# Patient Record
Sex: Female | Born: 1960 | Race: Black or African American | Hispanic: No | Marital: Married | Smoking: Former smoker
Health system: Southern US, Community
[De-identification: ages and names within clinical notes are randomized; demographics above are authoritative.]

## PROBLEM LIST (undated history)

## (undated) DIAGNOSIS — F209 Schizophrenia, unspecified: Secondary | ICD-10-CM

## (undated) DIAGNOSIS — J45909 Unspecified asthma, uncomplicated: Secondary | ICD-10-CM

## (undated) DIAGNOSIS — I10 Essential (primary) hypertension: Secondary | ICD-10-CM

## (undated) DIAGNOSIS — J069 Acute upper respiratory infection, unspecified: Secondary | ICD-10-CM

## (undated) DIAGNOSIS — E785 Hyperlipidemia, unspecified: Secondary | ICD-10-CM

## (undated) DIAGNOSIS — E079 Disorder of thyroid, unspecified: Secondary | ICD-10-CM

## (undated) DIAGNOSIS — L509 Urticaria, unspecified: Secondary | ICD-10-CM

## (undated) DIAGNOSIS — T783XXA Angioneurotic edema, initial encounter: Secondary | ICD-10-CM

## (undated) DIAGNOSIS — K219 Gastro-esophageal reflux disease without esophagitis: Secondary | ICD-10-CM

## (undated) DIAGNOSIS — B019 Varicella without complication: Secondary | ICD-10-CM

## (undated) DIAGNOSIS — T7840XA Allergy, unspecified, initial encounter: Secondary | ICD-10-CM

## (undated) DIAGNOSIS — F32A Depression, unspecified: Secondary | ICD-10-CM

## (undated) DIAGNOSIS — R079 Chest pain, unspecified: Secondary | ICD-10-CM

## (undated) DIAGNOSIS — N189 Chronic kidney disease, unspecified: Secondary | ICD-10-CM

## (undated) DIAGNOSIS — E0590 Thyrotoxicosis, unspecified without thyrotoxic crisis or storm: Secondary | ICD-10-CM

## (undated) DIAGNOSIS — F419 Anxiety disorder, unspecified: Secondary | ICD-10-CM

## (undated) DIAGNOSIS — D649 Anemia, unspecified: Secondary | ICD-10-CM

## (undated) DIAGNOSIS — G4730 Sleep apnea, unspecified: Secondary | ICD-10-CM

## (undated) DIAGNOSIS — E119 Type 2 diabetes mellitus without complications: Secondary | ICD-10-CM

## (undated) HISTORY — DX: Essential (primary) hypertension: I10

## (undated) HISTORY — DX: Acute upper respiratory infection, unspecified: J06.9

## (undated) HISTORY — DX: Urticaria, unspecified: L50.9

## (undated) HISTORY — DX: Varicella without complication: B01.9

## (undated) HISTORY — DX: Unspecified asthma, uncomplicated: J45.909

## (undated) HISTORY — PX: TONSILLECTOMY: SUR1361

## (undated) HISTORY — DX: Disorder of thyroid, unspecified: E07.9

## (undated) HISTORY — DX: Angioneurotic edema, initial encounter: T78.3XXA

## (undated) HISTORY — DX: Anxiety disorder, unspecified: F41.9

## (undated) HISTORY — DX: Depression, unspecified: F32.A

## (undated) HISTORY — PX: TUBAL LIGATION: SHX77

## (undated) HISTORY — PX: SINOSCOPY: SHX187

## (undated) HISTORY — DX: Allergy, unspecified, initial encounter: T78.40XA

## (undated) HISTORY — PX: SINUS IRRIGATION: SHX2411

## (undated) HISTORY — PX: COLONOSCOPY: SHX174

## (undated) HISTORY — DX: Hyperlipidemia, unspecified: E78.5

## (undated) HISTORY — DX: Chronic kidney disease, unspecified: N18.9

---
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
## 2009-03-13 DIAGNOSIS — J45909 Unspecified asthma, uncomplicated: Secondary | ICD-10-CM | POA: Insufficient documentation

## 2009-10-03 DIAGNOSIS — R400 Somnolence: Secondary | ICD-10-CM | POA: Insufficient documentation

## 2012-04-28 DIAGNOSIS — J329 Chronic sinusitis, unspecified: Secondary | ICD-10-CM | POA: Diagnosis not present

## 2012-08-25 LAB — HM MAMMOGRAPHY

## 2013-01-10 ENCOUNTER — Ambulatory Visit (INDEPENDENT_AMBULATORY_CARE_PROVIDER_SITE_OTHER): Payer: Medicare Other | Admitting: Internal Medicine

## 2013-01-10 ENCOUNTER — Other Ambulatory Visit (INDEPENDENT_AMBULATORY_CARE_PROVIDER_SITE_OTHER): Payer: Medicare Other

## 2013-01-10 ENCOUNTER — Encounter: Payer: Self-pay | Admitting: Internal Medicine

## 2013-01-10 VITALS — BP 118/60 | HR 83 | Temp 98.8°F | Resp 16 | Ht 64.0 in | Wt 231.0 lb

## 2013-01-10 DIAGNOSIS — R5381 Other malaise: Secondary | ICD-10-CM

## 2013-01-10 DIAGNOSIS — E039 Hypothyroidism, unspecified: Secondary | ICD-10-CM | POA: Diagnosis not present

## 2013-01-10 DIAGNOSIS — E785 Hyperlipidemia, unspecified: Secondary | ICD-10-CM | POA: Diagnosis not present

## 2013-01-10 DIAGNOSIS — R7302 Impaired glucose tolerance (oral): Secondary | ICD-10-CM

## 2013-01-10 DIAGNOSIS — E669 Obesity, unspecified: Secondary | ICD-10-CM

## 2013-01-10 DIAGNOSIS — R5383 Other fatigue: Secondary | ICD-10-CM

## 2013-01-10 DIAGNOSIS — R7309 Other abnormal glucose: Secondary | ICD-10-CM

## 2013-01-10 DIAGNOSIS — I10 Essential (primary) hypertension: Secondary | ICD-10-CM

## 2013-01-10 DIAGNOSIS — F209 Schizophrenia, unspecified: Secondary | ICD-10-CM

## 2013-01-10 LAB — CBC
MCHC: 32.8 g/dL (ref 30.0–36.0)
MCV: 84.8 fl (ref 78.0–100.0)
Platelets: 294 10*3/uL (ref 150.0–400.0)

## 2013-01-10 LAB — BASIC METABOLIC PANEL
BUN: 13 mg/dL (ref 6–23)
GFR: 62.57 mL/min (ref >60.00)
Potassium: 3.3 mEq/L — ABNORMAL LOW (ref 3.5–5.1)

## 2013-01-10 LAB — LDL CHOLESTEROL, DIRECT: Direct LDL: 124.1 mg/dL

## 2013-01-10 LAB — LIPID PANEL
Cholesterol: 216 mg/dL — ABNORMAL HIGH (ref 0–200)
VLDL: 22.8 mg/dL (ref 0.0–40.0)

## 2013-01-10 LAB — HEMOGLOBIN A1C: Hgb A1c MFr Bld: 6.2 % (ref 4.6–6.5)

## 2013-01-10 NOTE — Assessment & Plan Note (Signed)
Well controlled Continue current meds at this time Consume a low sodium diet

## 2013-01-10 NOTE — Patient Instructions (Signed)

## 2013-01-10 NOTE — Assessment & Plan Note (Signed)
Uncontrolled with poor diet compliance Will recheck lipid panel today If still elevated, will start statin therpay

## 2013-01-10 NOTE — Assessment & Plan Note (Signed)
Will check Hgb A1c today  

## 2013-01-10 NOTE — Assessment & Plan Note (Signed)
Will check TSH today.  Will titrate meds based on levels

## 2013-01-10 NOTE — Assessment & Plan Note (Signed)
Start a diet and exercise program today

## 2013-01-10 NOTE — Progress Notes (Signed)
HPI  Pt presents to the clinic today to establish care. She is transferring care from [facility] in [place], [place]. She has no concerns today.  Flu: 2013 Tetanus: greater than 10 years Mammogram: 2013 Pap smear: 2013 LMP: 12/11/2012 Colonoscopy: 2013 Eye doctor: never Dentist: prn  Past Medical History  Diagnosis Date  . Asthma   . Chicken pox   . Allergy   . Hypertension   . Hyperlipidemia   . Thyroid disease     Current Outpatient Prescriptions  Medication Sig Dispense Refill  . Albuterol Sulfate (VENTOLIN HFA IN) Inhale 2 puffs into the lungs 4 (four) times daily.      Marland Kitchen amLODipine (NORVASC) 10 MG tablet Take 10 mg by mouth daily.      . carvedilol (COREG) 12.5 MG tablet Take 12.5 mg by mouth 2 (two) times daily with a meal.      . cetirizine (ZYRTEC) 10 MG tablet Take 10 mg by mouth daily.      . Fluticasone-Salmeterol (ADVAIR) 250-50 MCG/DOSE AEPB Inhale 1 puff into the lungs every 12 (twelve) hours.      . haloperidol (HALDOL) 1 MG tablet Take 1 mg by mouth 2 (two) times daily.      . hydrochlorothiazide (HYDRODIURIL) 25 MG tablet Take 25 mg by mouth daily.      Marland Kitchen levothyroxine (SYNTHROID, LEVOTHROID) 50 MCG tablet Take 50 mcg by mouth daily.      Marland Kitchen lisinopril (PRINIVIL,ZESTRIL) 40 MG tablet Take 40 mg by mouth daily.      . mometasone (NASONEX) 50 MCG/ACT nasal spray Place 2 sprays into the nose daily.      . potassium chloride SA (K-DUR,KLOR-CON) 20 MEQ tablet Take 20 mEq by mouth daily.        No Known Allergies  Family History  Problem Relation Age of Onset  . Mental illness Mother   . Hypertension Mother   . Cancer Father     LUNG  . Cancer Sister     BREAST  . Mental illness Brother   . Hypertension Brother   . Heart disease Maternal Grandmother     History   Social History  . Marital Status: Married    Spouse Name: N/A    Number of Children: N/A  . Years of Education: N/A   Occupational History  . Not on file.   Social History Main  Topics  . Smoking status: Never Smoker   . Smokeless tobacco: Not on file  . Alcohol Use: Not on file  . Drug Use: Not on file  . Sexually Active: Not on file   Other Topics Concern  . Not on file   Social History Narrative   3 [place]    ROS:  Constitutional: Denies fever, malaise, fatigue, headache or abrupt weight changes.  HEENT: Denies eye pain, eye redness, ear pain, ringing in the ears, wax buildup, runny nose, nasal congestion, bloody nose, or sore throat. Respiratory: Denies difficulty breathing, shortness of breath, cough or sputum production.   Cardiovascular: Denies chest pain, chest tightness, palpitations or swelling in the hands or feet.  Gastrointestinal: Denies abdominal pain, bloating, constipation, diarrhea or blood in the stool.  GU: Denies frequency, urgency, pain with urination, blood in urine, odor or discharge. Musculoskeletal: Denies decrease in range of motion, difficulty with gait, muscle pain or joint pain and swelling.  Skin: Denies redness, rashes, lesions or ulcercations.  Neurological: Denies dizziness, difficulty with memory, difficulty with speech or problems with  balance and coordination.   No other specific complaints in a complete review of systems (except as listed in HPI above).  PE:  BP 118/60  Pulse 83  Temp 98.8 F (37.1 C) (Oral)  Resp 16  Ht 5\' 4"  (1.626 m)  Wt 231 lb (104.781 kg)  BMI 39.65 kg/m2  SpO2 97% Wt Readings from Last 3 Encounters:  01/10/13 231 lb (104.781 kg)    General: Appears her stated age, obese but well developed, well nourished in NAD. HEENT: Head: normal shape and size; Eyes: sclera white, no icterus, conjunctiva pink, PERRLA and EOMs intact; Ears: Tm's gray and intact, normal light reflex; Nose: mucosa pink and moist, septum midline; Throat/Mouth: Teeth present, mucosa pink and moist, no lesions or ulcerations noted.  Neck: Normal range of motion. Neck  supple, trachea midline. No massses, lumps or thyromegaly present.  Cardiovascular: Normal rate and rhythm. S1,S2 noted.  No murmur, rubs or gallops noted. No JVD or BLE edema. No carotid bruits noted. Pulmonary/Chest: Normal effort and positive vesicular breath sounds. No respiratory distress. No wheezes, rales or ronchi noted.  Abdomen: Soft and nontender. Normal bowel sounds, no bruits noted. No distention or masses noted. Liver, spleen and kidneys non palpable. Musculoskeletal: Normal range of motion. No signs of joint swelling. No difficulty with gait.  Neurological: Alert and oriented. Cranial nerves II-XII intact. Coordination normal. +DTRs bilaterally. Psychiatric: Mood and affect normal. Behavior is normal. Judgment and thought content normal.    Assessment and Plan:  Preventative maintenance:  Start a diet and exercise program Will obtain labwork today Pt declines tetanus shot today  RTC in 6 months or sooner if needed

## 2013-01-11 ENCOUNTER — Other Ambulatory Visit: Payer: Self-pay | Admitting: Internal Medicine

## 2013-01-11 ENCOUNTER — Telehealth: Payer: Self-pay | Admitting: *Deleted

## 2013-01-11 DIAGNOSIS — E785 Hyperlipidemia, unspecified: Secondary | ICD-10-CM

## 2013-01-11 MED ORDER — ATORVASTATIN CALCIUM 10 MG PO TABS: 10.0000 mg | ORAL_TABLET | Freq: Every day | ORAL | Status: DC

## 2013-01-11 MED ORDER — POTASSIUM CHLORIDE CRYS ER 20 MEQ PO TBCR: 20.0000 meq | EXTENDED_RELEASE_TABLET | Freq: Two times a day (BID) | ORAL | Status: DC

## 2013-01-11 NOTE — Telephone Encounter (Signed)
Called pt with labs results. Maria Franklin want to increase potassium to 40 meq daily. Sending new rx to [facility]...Raechel Chute

## 2013-01-29 DIAGNOSIS — F200 Paranoid schizophrenia: Secondary | ICD-10-CM | POA: Diagnosis not present

## 2013-03-07 ENCOUNTER — Encounter (HOSPITAL_COMMUNITY): Payer: Self-pay | Admitting: Psychiatry

## 2013-03-07 ENCOUNTER — Ambulatory Visit (INDEPENDENT_AMBULATORY_CARE_PROVIDER_SITE_OTHER): Payer: Medicare Other | Admitting: Psychiatry

## 2013-03-07 VITALS — BP 143/87 | HR 71 | Wt 234.0 lb

## 2013-03-07 DIAGNOSIS — F200 Paranoid schizophrenia: Secondary | ICD-10-CM

## 2013-03-07 NOTE — Progress Notes (Signed)
Patient ID: Maria Franklin, female   DOB: [birth year], 52 y.o.   MRN: [id number] Chief complaint I need to establish care with psychiatrist.  History presenting illness. Patient is 52 year old African American unemployed married female who is self-referred for the management of her psychiatric illness.  She's been seeing psychiatrist in [place] since 2006 and recently moved to [place].  She's been taking Haldol 1 mg twice a day.  She is getting a diagnoses of schizophrenia.  Patient moved last September to live close to her children.  Her daughter is a Barista and graduating in May.  Patient endorse that current psychiatric medication is working very well.  She admitted noncompliance with medication has resulted increased paranoia, delusion, being suspicious about the family and very isolated.  As such patient does not have any side effects however she is concerned about her memory.  Sometimes she forgets things but it is chronic .  Patient is trying to get a job .  She has job interview however she was disappointed because she was not able to get the job.  She admitted very anxious and nervous during job interview process .  She's concern how she will handle the job if she takes it.  Patient denies any irritability anger mood swing or any hallucination however admitted more moodiness around her monthly cycle.  She gets irritable and poor sleep around that time .  Patient does not have any tremors or shakes.  She's not seeing any therapist.  She denies any active or passive suicidal thoughts or homicidal thoughts.  Her prescriptions are recently renewed by her primary care physician.  She denies any panic attack, obsessive thoughts or any violence or aggression.  Past psychiatric history. Patient admitted history of paranoia or delusion for a long time however she was able to handle it very well until 2006.  She became very sick and exhibited significant paranoia and delusion.  She  remember having delusions about her family .  She felt that people are monitoring her through television and other devices.  She was unable to trust anyone .  She felt people are following her and going to hurt her.  She became very depressed suicidal and isolated.  She took overdose on her pills and required psychiatric admission at [facility].  She was given medication but she remains very delusional and paranoid and stopped taking medication after few weeks .  She again admitted after 4 months due to decompensation.  Since then she's been taking her psychiatric medication.  Patient admitted history of depression, psychosis, paranoia and delusion.  However she denies any hallucination.  She endorse 1 history of suicidal attempt when she took overdose on her medication.  She was given Abilify first time but she do not remember taking for a long time.  Patient denies any history of physical sexual verbal or emotional abuse.  Family history. Patient admitted mother and other has schizophrenia.  Both developed late onset schizophrenia.  Her brother committed suicide .  She has another brother who now she believe having some psychotic symptoms.    Psychosocial history. Patient was born in [place].  She moved to [place] in 1988 with her husband.  She has 3 children and one step son.  She's very close to her children and her family.  She lives with her husband.    Education and work history. Patient has a college education and recently she has done associate degree in paralegal.  She  is actively looking for a job.    Alcohol and substance use history. Patient admitted history of drinking alcohol on and off until 2011.  She claims to be sober since then.  She also admitted smoking marijuana in her teenage.  Her last use was 1984.    Medical history. Patient has a history of hypertension, hyperlipidemia, glucose intolerance, hypothyroidism and obesity.  Her sees nurse practitioner at  [facility].    Review of Systems  Constitutional:       Obese  Musculoskeletal: Negative.   Psychiatric/Behavioral: Positive for memory loss. The patient is nervous/anxious.    Mental status examination. Patient is casually dressed and well-groomed.  She appears to be in his stated age.  She is obese female.  She maintained fair eye contact.  Her speech is clear and coherent.  Her thought processes logical linear and goal-directed.  She denies any auditory or visual hallucination.  She denies any active or passive suicidal thoughts or homicidal thoughts.  There were no flight of ideas or any loose association.  At this time there is no paranoia or delusion obsessive thoughts.  She's alert and oriented x3.  She described her mood is okay and her affect is mood appropriate.  She's alert and oriented x3.  She's able to recall old events.  Her insight judgment and impulse control is okay.  Assessment Axis I chronic paranoid schizophrenia. Axis II deferred. Axis III see medical history Axis IV mild Axis V 60-65  Plan At this time patient is fairly stable on her current psychiatric medication is Haldol 1 mg twice a day.  Patient does not exhibit any tremors shakes or any EPS.  I explain that memory impairment could be a part of her psychiatric illness and her psychiatric medication.  However I do not feel at this time the medicine should be change since she is borderline hyperglycemic and atypical antipsychotic medication may cause weight gain.  Patient does not want any brand name medication.  I recommend to see therapist for coping and social skills.  Patient still has at least 2 more refills for her Haldol which is given by her primary care physician in recent visit.  I review her blood work.  Her hemoglobin A1c is normal.  She is a high cholesterol however her kidney function test and liver enzymes are normal.  I recommend to call us back if his any question or concern if she worsening of  the symptom.  We discussed safety plan that anytime having active suicidal thoughts or homicidal thoughts and she need to call 911 or go to local emergency room.  I will see her again in 2 months.  Time spent 60 minutes.  Portion of this note is generated with voice dictation software and may contain typographical error.

## 2013-03-13 ENCOUNTER — Encounter (HOSPITAL_COMMUNITY): Payer: Self-pay | Admitting: Psychiatry

## 2013-03-13 ENCOUNTER — Ambulatory Visit (INDEPENDENT_AMBULATORY_CARE_PROVIDER_SITE_OTHER): Payer: Medicare Other | Admitting: Psychiatry

## 2013-03-13 DIAGNOSIS — F200 Paranoid schizophrenia: Secondary | ICD-10-CM

## 2013-03-13 DIAGNOSIS — F29 Unspecified psychosis not due to a substance or known physiological condition: Secondary | ICD-10-CM

## 2013-03-13 NOTE — Progress Notes (Signed)
Patient ID: Maria Franklin, female   DOB: 24-Oct-1961, 52 y.o.   MRN: [id number] Presenting Problem Chief Complaint: coping with mental illness, socialization, reentering workforce  What are the main stressors in your life right now, how long? Schizophrenia diagnosis, isolating  Previous mental health services Have you ever been treated for a mental health problem, when, where, by whom? Yes    Are you currently seeing a therapist or counselor, counselor's name? No   Have you ever been treated with medication, name, reason, response? Yes. haldol   Have you ever had suicidal thoughts or attempted suicide, when, how? No   Risk factors for Suicide Demographic factors:  Unemployed Current mental status: none Loss factors: Decrease in vocational status Historical factors: Family history of mental illness or substance abuse Risk Reduction factors: Religious beliefs about death and Living with another person, especially a relative Clinical factors:  Schizophrenia:   Paranoid or undifferentiated type Cognitive features that contribute to risk: none    SUICIDE RISK:  Minimal: No identifiable suicidal ideation.  Patients presenting with no risk factors but with morbid ruminations; may be classified as minimal risk based on the severity of the depressive symptoms  Medical history Medical treatment and/or problems, explain: No  Do you have any issues with c hronic pain?  No    Allergies: No Medication, reactions? no   Current medications:  Prescribed by: Arfeen Is there any history of mental health problems or substance abuse in your family, whom? Yes. Mother, brother, grandmother, and uncle were diagnosed with schizophrenia    Social/family history Have you been married, how many times?  one  Do you have children?  4 adult children   Who lives in your current household? husband  Hotel manager history: No   Religious/spiritual involvement:  What religion/faith base are you?  Christian  Family of origin (childhood history)  Where were you born? [place] area  Describe the atmosphere of the household where you grew up: deferred Do you have siblings, step/half siblings, list names, relation, sex, age? Yes     Social supports (personal and professional): husband, adult children, friends  Education How many grades have you completed? technical college Did you have any problems in school, what type? No  Medications prescribed for these problems? No   Employment (financial issues) Social security disability  Legal history none  Trauma/Abuse history: Have you ever been exposed to any form of abuse, what type? deferred  Have you ever been exposed to something traumatic, describe? deferred  Substance use Pt. Denies issues related to substance abuse.    Mental Status: General Appearance Maria Franklin:  Neat Eye Contact:  Good Motor Behavior:  Normal Speech:  Normal Level of Consciousness:  Alert Mood:  Dysphoric Affect:  Appropriate Anxiety Level: moderate Thought Process:  Coherent Thought Content:  WNL Perception:  Normal Judgment:  Good Insight:  Present Cognition:  WNL  Diagnosis AXIS I Psychotic Disorder NOS  AXIS II No diagnosis  AXIS III Past Medical History  Diagnosis Date  . Asthma   . Chicken pox   . Allergy   . Hypertension   . Hyperlipidemia   . Thyroid disease     AXIS IV occupational problems  AXIS V 51-60 moderate symptoms   Plan: Pt to return in one week for continued assessment.  _________________________________________       Boneta Lucks, LPC 03-13-13

## 2013-03-15 ENCOUNTER — Telehealth (HOSPITAL_COMMUNITY): Payer: Self-pay | Admitting: Psychiatry

## 2013-03-22 ENCOUNTER — Ambulatory Visit (HOSPITAL_COMMUNITY): Payer: Self-pay | Admitting: Psychiatry

## 2013-04-02 ENCOUNTER — Other Ambulatory Visit: Payer: Self-pay | Admitting: *Deleted

## 2013-04-02 MED ORDER — CARVEDILOL 12.5 MG PO TABS: 12.5000 mg | ORAL_TABLET | Freq: Two times a day (BID) | ORAL | Status: DC

## 2013-04-06 ENCOUNTER — Other Ambulatory Visit: Payer: Self-pay | Admitting: *Deleted

## 2013-04-06 MED ORDER — HYDROCHLOROTHIAZIDE 25 MG PO TABS: 25.0000 mg | ORAL_TABLET | Freq: Every day | ORAL | Status: DC

## 2013-04-06 NOTE — Telephone Encounter (Signed)
R'cd fax from [facility] for refill of HCTZ.  

## 2013-05-07 ENCOUNTER — Encounter (HOSPITAL_COMMUNITY): Payer: Self-pay | Admitting: Psychiatry

## 2013-05-07 ENCOUNTER — Ambulatory Visit (INDEPENDENT_AMBULATORY_CARE_PROVIDER_SITE_OTHER): Payer: Medicare Other | Admitting: Psychiatry

## 2013-05-07 VITALS — BP 119/87 | HR 77 | Ht 62.0 in | Wt 239.2 lb

## 2013-05-07 DIAGNOSIS — F200 Paranoid schizophrenia: Secondary | ICD-10-CM | POA: Diagnosis not present

## 2013-05-07 MED ORDER — HALOPERIDOL 2 MG PO TABS: 2.0000 mg | ORAL_TABLET | Freq: Every day | ORAL | Status: DC

## 2013-05-07 NOTE — Progress Notes (Signed)
[facility] [address] Progress Note  Maria Franklin [id number] 52 y.o.  05/07/2013 2:11 PM  Chief Complaint:  I cannot find job.  History of Present Illness: Patient is 52 year old [place] American female who came for her followup appointment.  She was seen first time on March 07 2013.  She has a diagnosis of chronic schizophrenia paranoid type.  She's compliant with her psychiatric medication Haldol 1 mg twice a day.  She was recommended to see Victorino Dike for counseling.  However patient is not interested in counseling and therapy at this time.  Patient told her biggest concern is to find a job.  She is actively looking and applying multiple places however so far she has not heard from them.  Patient is also concerned about her weight.  She has gained weight from the past.  Patient admitted not leaving her house very often.  Patient denies any depressive thoughts but admitted some time very anxious because she is not getting the job.  She endorse lack of ambulation, lack of exercise and walking may be causing weight gain.  She sleeps fine.  She denies any tremors or shakes.  She was happy that her daughter graduated.  Patient admitted having paranoia and very anxious at graduation.  She also endorse took extra Haldol that day.  However patient denies any hallucination or any active or passive suicidal thoughts.  Patient likes her medication.  She denies any aggression violence or any mania.  Suicidal Ideation: No Plan Formed: No Patient has means to carry out plan: No  Homicidal Ideation: No Plan Formed: No Patient has means to carry out plan: No  Review of Systems  Constitutional: Positive for malaise/fatigue.  HENT: Negative.   Respiratory: Negative.   Cardiovascular: Negative.   Genitourinary: Negative.   Musculoskeletal: Negative.   Skin: Negative.   Neurological: Negative.   Psychiatric/Behavioral: Positive for depression. Negative for suicidal ideas, hallucinations and substance  abuse. The patient is nervous/anxious. The patient does not have insomnia.     Psychiatric: Agitation: No Hallucination: No Depressed Mood: Yes Insomnia: No Hypersomnia: Yes Altered Concentration: No Feels Worthless: No Grandiose Ideas: No Belief In Special Powers: No New/Increased Substance Abuse: No Compulsions: No  Neurologic: Headache: No Seizure: No Paresthesias: No  Medical History: Patient has a hypertension, hyperlipidemia, and glucose intolerance, hypothyroidism and obesity.  She sees a Publishing rights manager at [facility].  Family history.  Patient admitted mother and other has schizophrenia. Both developed late onset schizophrenia. Her brother committed suicide . She has another brother who now she believe having some psychotic symptoms.   Psychosocial history.  Patient was born in [place]. She moved to [place] in 1988 with her husband. She has 3 children and one step son. She's very close to her children and her family. She lives with her husband.   Education and work history.  Patient has a college education and recently she has done associate degree in paralegal. She is actively looking for a job.   Alcohol and substance use history.  Patient admitted history of drinking alcohol on and off until 2011. She claims to be sober since then. She also admitted smoking marijuana in her teenage. Her last use was 1984.   Outpatient Encounter Prescriptions as of 05/07/2013  Medication Sig Dispense Refill  . Albuterol Sulfate (VENTOLIN HFA IN) Inhale 2 puffs into the lungs 4 (four) times daily.      Marland Kitchen amLODipine (NORVASC) 10 MG tablet Take 10 mg by mouth daily.      Marland Kitchen  atorvastatin (LIPITOR) 10 MG tablet Take 1 tablet (10 mg total) by mouth daily.  90 tablet  3  . carvedilol (COREG) 12.5 MG tablet Take 1 tablet (12.5 mg total) by mouth 2 (two) times daily with a meal.  60 tablet  5  . cetirizine (ZYRTEC) 10 MG tablet Take 10 mg by mouth daily.      .  Fluticasone-Salmeterol (ADVAIR) 250-50 MCG/DOSE AEPB Inhale 1 puff into the lungs every 12 (twelve) hours.      . haloperidol (HALDOL) 2 MG tablet Take 1 tablet (2 mg total) by mouth at bedtime.  30 tablet  2  . hydrochlorothiazide (HYDRODIURIL) 25 MG tablet Take 1 tablet (25 mg total) by mouth daily.  90 tablet  2  . levothyroxine (SYNTHROID, LEVOTHROID) 50 MCG tablet Take 50 mcg by mouth daily.      Marland Kitchen lisinopril (PRINIVIL,ZESTRIL) 40 MG tablet Take 40 mg by mouth daily.      . mometasone (NASONEX) 50 MCG/ACT nasal spray Place 2 sprays into the nose daily.      . potassium chloride SA (K-DUR,KLOR-CON) 20 MEQ tablet Take 1 tablet (20 mEq total) by mouth 2 (two) times daily.  60 tablet  3  . [DISCONTINUED] haloperidol (HALDOL) 1 MG tablet Take 1 mg by mouth 2 (two) times daily.       No facility-administered encounter medications on file as of 05/07/2013.    Past Psychiatric History/Hospitalization(s): Patient endorsed history of paranoia or delusion for a long time however she handled her illness very well until 2006.  She remembered exhibiting paranoia and delusion about her family members.  She felt that people are monitoring her through television.  She became very depressed suicidal and isolated.  She took overdose on her medication and acquire psychiatric admission at [facility].  She has a history of noncompliance with medication.  After 4 months she was again readmitted due to decompensation.  She was given Abilify which worked very well for her however she stopped taking because of expensive.  Patient denies any history of physical sexual verbal and emotional abuse. Anxiety: Yes Bipolar Disorder: No Depression: Yes Mania: No Psychosis: No Schizophrenia: Yes Personality Disorder: No Hospitalization for psychiatric illness: Yes History of Electroconvulsive Shock Therapy: No Prior Suicide Attempts: Yes  Physical Exam: Constitutional:  BP 119/87  Pulse 77  Ht 5\' 2"  (1.575 m)   Wt 239 lb 3.2 oz (108.5 kg)  BMI 43.74 kg/m2  General Appearance: well nourished, obese and Maintained fair eye contact  Musculoskeletal: Strength & Muscle Tone: within normal limits Gait & Station: normal Patient leans: N/A  Psychiatric: Speech (describe rate, volume, coherence, spontaneity, and abnormalities if any): Slow soft but clear and coherent.  Normal tone and volume.  Thought Process (describe rate, content, abstract reasoning, and computation): Slow logical and organized.  Associations: Relevant and Intact  Thoughts: normal and Feeling overwhelmed.  Mental Status: Orientation: oriented to person, place, time/date, situation, day of week and month of year Mood & Affect: depressed affect and anxiety Attention Span & Concentration: Fair  Medical Decision Making (Choose Three): Established Problem, Stable/Improving (1), Review of Psycho-Social Stressors (1), New Problem, with no additional work-up planned (3), Review of Last Therapy Session (1), Review of Medication Regimen & Side Effects (2) and Review of New Medication or Change in Dosage (2)  Assessment: Axis I: Schizophrenia chronic paranoid type  Axis II: Deferred  Axis III: See medical history  Axis IV: Mild  Axis V: 60-65   Plan:  I review her symptoms, her medication and response to the medication.  Patient is concerned about her weight gain and lack of employment.  She admitted some anxiety and depressive thoughts however she does not want any new medication.  She does not gone to continue counseling and therapy.  I recommend to discontinue her morning Haldol and move at bedtime.  Which can help her less sedation during the day.  I also recommend to walk regularly and try to go [employer] for reading, searching the Internet for employment opportunities.  We discuss that her sedentary lifestyle may be an issue gained weight.  Patient understands and agreed with the plan.  We will move Haldol morning goes to  night dose.  A new discussion of Haldol 2 mg at bedtime is given.  Explain in detail risk and benefits of medication and recommend to call us back if she is any question or concern if she feels worsening of the symptom.  Discuss safety plan that anytime having active suicidal thoughts or homicidal thoughts continue to call 911 or go to local emergency room.  I will see her again in 3 months.  Time spent 30 minutes.  More than 50% of the time spent and psychoeducation, counseling and coordination of care.    ARFEEN,SYED T., MD 05/07/2013

## 2013-05-11 ENCOUNTER — Other Ambulatory Visit: Payer: Self-pay

## 2013-05-11 DIAGNOSIS — Z1231 Encounter for screening mammogram for malignant neoplasm of breast: Secondary | ICD-10-CM

## 2013-06-04 ENCOUNTER — Other Ambulatory Visit: Payer: Self-pay | Admitting: *Deleted

## 2013-06-04 MED ORDER — MOMETASONE FUROATE 50 MCG/ACT NA SUSP: 2.0000 | Freq: Every day | NASAL | Status: DC

## 2013-06-04 NOTE — Telephone Encounter (Signed)
R'cd fax from [facility] for refill of Nasonex.

## 2013-06-05 ENCOUNTER — Other Ambulatory Visit: Payer: Self-pay | Admitting: *Deleted

## 2013-06-05 MED ORDER — FLUTICASONE-SALMETEROL 250-50 MCG/DOSE IN AEPB: 1.0000 | INHALATION_SPRAY | Freq: Two times a day (BID) | RESPIRATORY_TRACT | Status: DC

## 2013-06-05 NOTE — Telephone Encounter (Signed)
R'cd fax from [facility] for refill of Advair Diskus for 90 day supply.

## 2013-06-13 ENCOUNTER — Ambulatory Visit
Admission: RE | Admit: 2013-06-13 | Discharge: 2013-06-13 | Disposition: A | Discharge status: Home / Self Care | Payer: Medicare Other | Source: Ambulatory Visit

## 2013-06-13 DIAGNOSIS — Z1231 Encounter for screening mammogram for malignant neoplasm of breast: Secondary | ICD-10-CM | POA: Diagnosis not present

## 2013-06-21 ENCOUNTER — Other Ambulatory Visit: Payer: Self-pay | Admitting: *Deleted

## 2013-06-21 MED ORDER — LISINOPRIL 40 MG PO TABS: 40.0000 mg | ORAL_TABLET | Freq: Every day | ORAL | Status: DC

## 2013-06-22 ENCOUNTER — Other Ambulatory Visit: Payer: Self-pay | Admitting: Internal Medicine

## 2013-06-22 DIAGNOSIS — R928 Other abnormal and inconclusive findings on diagnostic imaging of breast: Secondary | ICD-10-CM

## 2013-07-04 ENCOUNTER — Other Ambulatory Visit: Payer: Self-pay | Admitting: Internal Medicine

## 2013-07-04 ENCOUNTER — Ambulatory Visit
Admission: RE | Admit: 2013-07-04 | Discharge: 2013-07-04 | Disposition: A | Discharge status: Home / Self Care | Payer: Medicare Other | Source: Ambulatory Visit | Attending: Internal Medicine | Admitting: Internal Medicine

## 2013-07-04 DIAGNOSIS — R928 Other abnormal and inconclusive findings on diagnostic imaging of breast: Secondary | ICD-10-CM

## 2013-07-04 DIAGNOSIS — N6009 Solitary cyst of unspecified breast: Secondary | ICD-10-CM | POA: Diagnosis not present

## 2013-07-13 ENCOUNTER — Ambulatory Visit (INDEPENDENT_AMBULATORY_CARE_PROVIDER_SITE_OTHER): Payer: Medicare Other | Admitting: Internal Medicine

## 2013-07-13 ENCOUNTER — Encounter: Payer: Self-pay | Admitting: Internal Medicine

## 2013-07-13 ENCOUNTER — Other Ambulatory Visit (INDEPENDENT_AMBULATORY_CARE_PROVIDER_SITE_OTHER): Payer: Medicare Other

## 2013-07-13 VITALS — BP 120/76 | HR 85 | Temp 98.5°F | Wt 238.0 lb

## 2013-07-13 DIAGNOSIS — I10 Essential (primary) hypertension: Secondary | ICD-10-CM | POA: Diagnosis not present

## 2013-07-13 DIAGNOSIS — E876 Hypokalemia: Secondary | ICD-10-CM | POA: Diagnosis not present

## 2013-07-13 DIAGNOSIS — E669 Obesity, unspecified: Secondary | ICD-10-CM | POA: Diagnosis not present

## 2013-07-13 DIAGNOSIS — E785 Hyperlipidemia, unspecified: Secondary | ICD-10-CM | POA: Diagnosis not present

## 2013-07-13 LAB — COMPREHENSIVE METABOLIC PANEL
ALT: 22 U/L (ref 0–35)
Alkaline Phosphatase: 76 U/L (ref 39–117)
Creatinine, Ser: 1.3 mg/dL — ABNORMAL HIGH (ref 0.4–1.2)
Glucose, Bld: 111 mg/dL — ABNORMAL HIGH (ref 70–99)
Sodium: 141 mEq/L (ref 135–145)
Total Bilirubin: 0.4 mg/dL (ref 0.3–1.2)
Total Protein: 7.3 g/dL (ref 6.0–8.3)

## 2013-07-13 LAB — LIPID PANEL
Cholesterol: 133 mg/dL (ref 0–200)
HDL: 56.8 mg/dL (ref >39.00)
LDL Cholesterol: 55 mg/dL (ref 0–99)
VLDL: 20.8 mg/dL (ref 0.0–40.0)

## 2013-07-13 NOTE — Progress Notes (Signed)
Subjective:    Patient ID: Maria Franklin, female    DOB: Jun 14, 1961, 52 y.o.   MRN: [id number]  HPI  Pt presents to the clinic today for 6 month f/u of chronic medical conditions. She does have some concerns about weight gain today. She has gained 7 lbs over the last 6 months. She intermittently exercises and diets but has not lost any weight.  Htn: well controlled on norvasc, lisinopril, coreg and hctz. Tolerating the meds well without side effects.  Hyperlipidemia: started on lipitor in 01/2013, no muscle aches. Non compliant with diet and exercise. Gained 7 lbs.  Hypothyroidism: weight gain but no other symptoms, still taking synthroid  Review of Systems      Past Medical History  Diagnosis Date  . Asthma   . Chicken pox   . Allergy   . Hypertension   . Hyperlipidemia   . Thyroid disease     Current Outpatient Prescriptions  Medication Sig Dispense Refill  . Albuterol Sulfate (VENTOLIN HFA IN) Inhale 2 puffs into the lungs 4 (four) times daily.      Marland Kitchen amLODipine (NORVASC) 10 MG tablet Take 10 mg by mouth daily.      Marland Kitchen atorvastatin (LIPITOR) 10 MG tablet Take 1 tablet (10 mg total) by mouth daily.  90 tablet  3  . carvedilol (COREG) 12.5 MG tablet Take 1 tablet (12.5 mg total) by mouth 2 (two) times daily with a meal.  60 tablet  5  . cetirizine (ZYRTEC) 10 MG tablet Take 10 mg by mouth daily.      . Fluticasone-Salmeterol (ADVAIR) 250-50 MCG/DOSE AEPB Inhale 1 puff into the lungs every 12 (twelve) hours.  180 each  2  . haloperidol (HALDOL) 2 MG tablet Take 1 tablet (2 mg total) by mouth at bedtime.  30 tablet  2  . hydrochlorothiazide (HYDRODIURIL) 25 MG tablet Take 1 tablet (25 mg total) by mouth daily.  90 tablet  2  . KLOR-CON M20 20 MEQ tablet TAKE ONE TABLET BY MOUTH TWICE DAILY  60 tablet  0  . levothyroxine (SYNTHROID, LEVOTHROID) 50 MCG tablet Take 50 mcg by mouth daily.      Marland Kitchen lisinopril (PRINIVIL,ZESTRIL) 40 MG tablet Take 1 tablet (40 mg total) by mouth daily.   30 tablet  5  . mometasone (NASONEX) 50 MCG/ACT nasal spray Place 2 sprays into the nose daily.  17 g  3   No current facility-administered medications for this visit.    No Known Allergies  Family History  Problem Relation Age of Onset  . Mental illness Mother   . Hypertension Mother   . Schizophrenia Mother   . Cancer Father     LUNG  . Cancer Sister     BREAST  . Mental illness Brother   . Hypertension Brother   . Schizophrenia Brother   . Heart disease Maternal Grandmother   . Schizophrenia Brother     History   Social History  . Marital Status: Married    Spouse Name: N/A    Number of Children: N/A  . Years of Education: N/A   Occupational History  . Not on file.   Social History Main Topics  . Smoking status: Former Games developer  . Smokeless tobacco: Not on file  . Alcohol Use: No  . Drug Use: No  . Sexually Active: Not on file   Other Topics Concern  . Not on file   Social History Narrative   3 CHILDREN   RECENTLY MOVED  TO [place]   SEEKING EMPLOYMENT     Constitutional: Denies fever, malaise, fatigue, headache or abrupt weight changes.  Respiratory: Denies difficulty breathing, shortness of breath, cough or sputum production.   Cardiovascular: Pt reports ankle swelling. Denies chest pain, chest tightness, palpitations or swelling in the hands or feet.  Musculoskeletal: Denies decrease in range of motion, difficulty with gait, muscle pain or joint pain and swelling.  Neurological: Denies dizziness, difficulty with memory, difficulty with speech or problems with balance and coordination.   No other specific complaints in a complete review of systems (except as listed in HPI above).  Objective:   Physical Exam   BP 120/76  Pulse 85  Temp(Src) 98.5 F (36.9 C) (Oral)  Wt 238 lb (107.956 kg)  BMI 43.52 kg/m2  SpO2 99%  LMP 06/08/2013 Wt Readings from Last 3 Encounters:  07/13/13 238 lb (107.956 kg)  05/07/13 239 lb 3.2 oz (108.5 kg)   03/07/13 234 lb (106.142 kg)    General: Appears her stated age, obese but well developed, well nourished in NAD. Cardiovascular: Normal rate and rhythm. S1,S2 noted.  No murmur, rubs or gallops noted. No JVD or BLE edema. No carotid bruits noted. Pulmonary/Chest: Normal effort and positive vesicular breath sounds. No respiratory distress. No wheezes, rales or ronchi noted.  Musculoskeletal: Normal range of motion. No signs of joint swelling. No difficulty with gait.  Neurological: Alert and oriented. Cranial nerves II-XII intact. Coordination normal. +DTRs bilaterally.   BMET    Component Value Date/Time   NA 137 01/10/2013 1103   K 3.3* 01/10/2013 1103   CL 100 01/10/2013 1103   CO2 31 01/10/2013 1103   GLUCOSE 102* 01/10/2013 1103   BUN 13 01/10/2013 1103   CREATININE 1.2 01/10/2013 1103   CALCIUM 9.1 01/10/2013 1103    Lipid Panel     Component Value Date/Time   CHOL 216* 01/10/2013 1103   TRIG 114.0 01/10/2013 1103   HDL 63.70 01/10/2013 1103   CHOLHDL 3 01/10/2013 1103   VLDL 22.8 01/10/2013 1103    CBC    Component Value Date/Time   WBC 8.2 01/10/2013 1103   RBC 4.18 01/10/2013 1103   HGB 11.6* 01/10/2013 1103   HCT 35.4* 01/10/2013 1103   PLT 294.0 01/10/2013 1103   MCV 84.8 01/10/2013 1103   MCHC 32.8 01/10/2013 1103   RDW 14.1 01/10/2013 1103    Hgb A1C Lab Results  Component Value Date   HGBA1C 6.2 01/10/2013        Assessment & Plan:

## 2013-07-13 NOTE — Assessment & Plan Note (Signed)
Start to maintain a 1200 calorie diet Information provided

## 2013-07-13 NOTE — Assessment & Plan Note (Signed)
Related to HCTZ Increased supplement to 20 meq  Will recheck potassium level today

## 2013-07-13 NOTE — Patient Instructions (Signed)
1200 Calorie Diabetic Diet The 1200 calorie diabetic diet limits calories to 1200 each day. Following this diet and making healthy meal choices can help improve overall health. It controls blood glucose (sugar) levels and can also help lower blood pressure and cholesterol.  SERVING SIZES Measuring foods and serving sizes helps to make sure you are getting the right amount of food. The list below tells how big or small some common serving sizes are.   1 oz.........4 stacked dice.  3 oz.........Deck of cards.  1 tsp........Tip of little finger.  1 tbs........Thumb.  2 tbs........Golf ball.   cup.......Half of a fist.  1 cup........A fist. GUIDELINES FOR CHOOSING FOODS The goal of this diet is to eat a variety of foods and limit calories to 1200 each day. This can be done by choosing foods that are low in calories and fat. The diet also suggests eating small amounts of food frequently. Doing this helps control your blood glucose levels, so they do not get too high or too low. Each meal or snack may include a protein food source to help you feel more satisfied. Try to eat about the same amount of food around the same time each day. This includes weekend days, travel days, and days off work. Space your meals about 4 to 5 hours apart, and add a snack between them, if you wish.  For example, a daily food plan could include breakfast, a morning snack, lunch, dinner, and an evening snack. Healthy meals and snacks have different types of foods, including whole grains, vegetables, fruits, lean meats, poultry, fish, and dairy products. As you plan your meals, select a variety of foods. Choose from the bread and starch, vegetable, fruit, dairy, and meat/protein groups. Examples of foods from each group are listed below, with their suggested serving sizes. Use measuring cups and spoons to become familiar with what a healthy portion looks like. Bread and Starch Each serving equals 15 grams of  carbohydrate.  1 slice bread.   bagel.   cup cold cereal (unsweetened).   cup hot cereal or mashed potatoes.  1 small potato (size of a computer mouse).   cup cooked pasta or rice.   English muffin.  1 cup broth-based soup.  3 cups of popcorn.  4 to 6 whole-wheat crackers.   cup cooked beans, peas, or corn. Vegetables Each serving equals 5 grams of carbohydrate.   cup cooked vegetables.  1 cup raw vegetables.   cup tomato or vegetable juice. Fruit Each serving equals 15 grams of carbohydrate.  1 small apple or orange.  1  cup watermelon or strawberries.   cup applesauce (no sugar added).  2 tbs raisins.   banana.   cup canned fruit, packed in water or in its own juice.   cup unsweetened fruit juice. Dairy Each serving equals 12 to 15 grams of carbohydrate.  1 cup fat-free milk.  6 oz artificially sweetened yogurt or plain yogurt.  1 cup low-fat buttermilk.  1 cup soy milk.  1 cup almond milk. Meat/Protein  1 large egg.  2 to 3 oz meat, poultry, or fish.   cup low-fat cottage cheese.  1 tbs peanut butter.  1 oz low-fat cheese.   cup tuna, packed in water.   cup tofu. Fat  1 tsp oil.  1 tsp trans-fat-free margarine.  1 tsp butter.  1 tsp mayonnaise.  2 tbs avocado.  1 tbs salad dressing.  1 tbs cream cheese.  2 tbs sour cream. SAMPLE 1200 CALORIE   DIET PLAN Breakfast  1 cup fat-free milk (1 carb serving).  1 small orange (1 carb serving).  1 scrambled egg.  1 slice whole-wheat toast (1 carb serving). Lunch  Sandwich  2 slices whole-wheat bread (2 carb servings).  2 oz lean meat.  2 tsp reduced fat mayonnaise.  1 lettuce leaf.  2 slices tomato.  1 cup carrot sticks. Afternoon Snack  1 small apple (1 carb serving).  1 string cheese. Dinner  2 oz meat.  1 small baked potato (1 carb serving).  1 tsp trans-fat-free margarine.  1 cup steamed broccoli.  1 cup fat-free milk (1 carb  serving). Evening Snack   small banana (1 carb serving).  6 vanilla wafers (1 carb serving). MEAL PLAN You can use this worksheet to help you make a daily meal plan based on the 1200 calorie diabetic diet suggestions. If you are using this plan to help you control your blood glucose, you may interchange carbohydrate containing foods (dairy, starches, and fruits). Select a variety of fresh foods of varying colors and flavors. The total amount of carbohydrate in your meals or snacks is more important than making sure you include all of the food groups every time you eat. You can choose from approximately this many of the following foods to build your day's meals:  6 Starches.  3 Vegetables.  2 Fruits.  2 Dairy.  4 to 6 oz Meat/Protein.  Up to 3 Fats. Your dietician can use this worksheet to help you decide how many servings and which types of foods are right for you. BREAKFAST Food Group and Servings / Food Choice Starch _________________________________________________________ Dairy __________________________________________________________ Fruit ___________________________________________________________ Meat/Protein____________________________________________________ Fat ____________________________________________________________ LUNCH Food Group and Servings / Food Choice  Starch _________________________________________________________ Meat/Protein ___________________________________________________ Vegetables _____________________________________________________ Fruit __________________________________________________________ Dairy __________________________________________________________ Fat ____________________________________________________________ AFTERNOON SNACK Food Group and Servings / Food Choice Dairy __________________________________________________________ Fruit ___________________________________________________________ Starch  __________________________________________________________ Meat/Protein____________________________________________________ DINNER Food Group and Servings / Food Choice Starch _________________________________________________________ Meat/Protein ___________________________________________________ Dairy __________________________________________________________ Vegetables _____________________________________________________ Fruit __________________________________________________________ Fat ____________________________________________________________ EVENING SNACK Food Group and Servings / Food Choice Fruit ___________________________________________________________ Meat/Protein ____________________________________________________ Dairy __________________________________________________________ Starch __________________________________________________________ DAILY TOTALS Starches _________________________ Vegetables _______________________ Fruits ____________________________ Dairy ____________________________ Meat/Protein______________________ Fats _____________________________ Document Released: 06/14/2005 Document Revised: 02/14/2012 Document Reviewed: 10/09/2009 Exit[employer] Patient Information 2014 [employer].  

## 2013-07-13 NOTE — Assessment & Plan Note (Signed)
On lipitor Will recheck lipids and LFT today

## 2013-07-13 NOTE — Assessment & Plan Note (Signed)
Well controlled Continue current therapy 

## 2013-07-19 ENCOUNTER — Encounter (HOSPITAL_COMMUNITY): Payer: Self-pay | Admitting: Psychiatry

## 2013-07-19 ENCOUNTER — Ambulatory Visit (INDEPENDENT_AMBULATORY_CARE_PROVIDER_SITE_OTHER): Payer: Medicare Other | Admitting: Psychiatry

## 2013-07-19 VITALS — BP 128/82 | HR 84 | Ht 62.0 in | Wt 238.0 lb

## 2013-07-19 DIAGNOSIS — F200 Paranoid schizophrenia: Secondary | ICD-10-CM

## 2013-07-19 MED ORDER — HALOPERIDOL 2 MG PO TABS: 2.0000 mg | ORAL_TABLET | Freq: Every day | ORAL | Status: DC

## 2013-07-19 NOTE — Progress Notes (Signed)
[facility] [id number] Progress Note  Maria Franklin [id number] 52 y.o.  07/19/2013 2:31 PM  Chief Complaint:  Followup.  History of Present Illness: Patient is 52 year old [place] American female who came earlier than scheduled for her followup appointment.  Patient has a job interview and she is hoping to start her work on September 2.  If she is able to get the job she is going to be in a 3 month training.  She is applying at [employer] .  She wants to see this Clinical research associate earlier so she can get her medication.  She is moving Haldol all at bedtime.  She is sleeping better.  She does not feel that he sedated in the morning.  She denies any recent paranoia auditory hallucination.  She continues to have concern about her weight however she has not gained weight since the last visit.  She recently seen her primary care physician and she has lab work  Done.  She has high cholesterol.  She also endorsed swelling in her feet and she is taking hydrochlorothiazide for that.  She was unable to walk however she was given a nutritional plan to work on her weight and calories a day.  Overall patient is doing better .  She denies any crying spells.  She denies any irritability or anger.  She is really hoping to get the job.  She doesn't hallucination or any aggression.  She is not drinking or using any illegal substances.  Suicidal Ideation: No Plan Formed: No Patient has means to carry out plan: No  Homicidal Ideation: No Plan Formed: No Patient has means to carry out plan: No  Review of Systems  Constitutional: Positive for malaise/fatigue.  HENT: Negative.   Respiratory: Negative.   Cardiovascular: Negative.   Genitourinary: Negative.   Musculoskeletal: Negative.   Skin: Negative.   Neurological: Negative.   Psychiatric/Behavioral: Negative for suicidal ideas, hallucinations and substance abuse. The patient is nervous/anxious. The patient does not have insomnia.      Psychiatric: Agitation: No Hallucination: No Depressed Mood: Yes Insomnia: No Hypersomnia: Yes Altered Concentration: No Feels Worthless: No Grandiose Ideas: No Belief In Special Powers: No New/Increased Substance Abuse: No Compulsions: No  Neurologic: Headache: No Seizure: No Paresthesias: No  Medical History: Patient has a hypertension, hyperlipidemia, and glucose intolerance, hypothyroidism and obesity.  She sees a Publishing rights manager at [employer] care.  Family history.  Patient admitted mother and other has schizophrenia. Both developed late onset schizophrenia. Her brother committed suicide . She has another brother who now she believe having some psychotic symptoms.   Psychosocial history.  Patient was born in [place]. She moved to [place] in 1988 with her husband. She has 3 children and one step son. She's very close to her children and her family. She lives with her husband.   Education and work history.  Patient has a college education and recently she has done associate degree in paralegal. She is actively looking for a job.   Alcohol and substance use history.  Patient admitted history of drinking alcohol on and off until 2011. She claims to be sober since then. She also admitted smoking marijuana in her teenage. Her last use was 1984.   Outpatient Encounter Prescriptions as of 07/19/2013  Medication Sig Dispense Refill  . Albuterol Sulfate (VENTOLIN HFA IN) Inhale 2 puffs into the lungs 4 (four) times daily.      Marland Kitchen amLODipine (NORVASC) 10 MG tablet Take 10 mg by mouth daily.      Marland Kitchen  atorvastatin (LIPITOR) 10 MG tablet Take 1 tablet (10 mg total) by mouth daily.  90 tablet  3  . carvedilol (COREG) 12.5 MG tablet Take 1 tablet (12.5 mg total) by mouth 2 (two) times daily with a meal.  60 tablet  5  . cetirizine (ZYRTEC) 10 MG tablet Take 10 mg by mouth daily.      . Fluticasone-Salmeterol (ADVAIR) 250-50 MCG/DOSE AEPB Inhale 1 puff into the lungs  every 12 (twelve) hours.  180 each  2  . haloperidol (HALDOL) 2 MG tablet Take 1 tablet (2 mg total) by mouth at bedtime.  90 tablet  0  . hydrochlorothiazide (HYDRODIURIL) 25 MG tablet Take 1 tablet (25 mg total) by mouth daily.  90 tablet  2  . KLOR-CON M20 20 MEQ tablet TAKE ONE TABLET BY MOUTH TWICE DAILY  60 tablet  0  . levothyroxine (SYNTHROID, LEVOTHROID) 50 MCG tablet Take 50 mcg by mouth daily.      Marland Kitchen lisinopril (PRINIVIL,ZESTRIL) 40 MG tablet Take 1 tablet (40 mg total) by mouth daily.  30 tablet  5  . mometasone (NASONEX) 50 MCG/ACT nasal spray Place 2 sprays into the nose daily.  17 g  3  . [DISCONTINUED] haloperidol (HALDOL) 2 MG tablet Take 1 tablet (2 mg total) by mouth at bedtime.  30 tablet  2   No facility-administered encounter medications on file as of 07/19/2013.    Past Psychiatric History/Hospitalization(s): Patient endorsed history of paranoia or delusion for a long time however she handled her illness very well until 2006.  She remembered exhibiting paranoia and delusion about her family members.  She felt that people are monitoring her through television.  She became very depressed suicidal and isolated.  She took overdose on her medication and acquire psychiatric admission at [facility].  She has a history of noncompliance with medication.  After 4 months she was again readmitted due to decompensation.  She was given Abilify which worked very well for her however she stopped taking because of expensive.  Patient denies any history of physical sexual verbal and emotional abuse. Anxiety: Yes Bipolar Disorder: No Depression: Yes Mania: No Psychosis: No Schizophrenia: Yes Personality Disorder: No Hospitalization for psychiatric illness: Yes History of Electroconvulsive Shock Therapy: No Prior Suicide Attempts: Yes  Physical Exam: Constitutional:  BP 128/82  Pulse 84  Ht 5\' 2"  (1.575 m)  Wt 238 lb (107.956 kg)  BMI 43.52 kg/m2  LMP 06/08/2013  Recent  Results (from the past 2160 hour(s))  COMPREHENSIVE METABOLIC PANEL     Status: Abnormal   Collection Time    07/13/13 10:32 AM      Result Value Range   Sodium 141  135 - 145 mEq/L   Potassium 3.9  3.5 - 5.1 mEq/L   Chloride 105  96 - 112 mEq/L   CO2 30  19 - 32 mEq/L   Glucose, Bld 111 (*) 70 - 99 mg/dL   BUN 16  6 - 23 mg/dL   Creatinine, Ser 1.3 (*) 0.4 - 1.2 mg/dL   Total Bilirubin 0.4  0.3 - 1.2 mg/dL   Alkaline Phosphatase 76  39 - 117 U/L   AST 21  0 - 37 U/L   ALT 22  0 - 35 U/L   Total Protein 7.3  6.0 - 8.3 g/dL   Albumin 3.7  3.5 - 5.2 g/dL   Calcium 9.5  8.4 - 16.1 mg/dL   GFR 09.60 (*) >45.40 mL/min  LIPID PANEL  Status: None   Collection Time    07/13/13 10:32 AM      Result Value Range   Cholesterol 133  0 - 200 mg/dL   Comment: ATP III Classification       Desirable:  < 200 mg/dL               Borderline High:  200 - 239 mg/dL          High:  > = 161 mg/dL   Triglycerides 096.0  0.0 - 149.0 mg/dL   Comment: Normal:  <454 mg/dLBorderline High:  150 - 199 mg/dL   HDL 09.81  >19.14 mg/dL   VLDL 78.2  0.0 - 95.6 mg/dL   LDL Cholesterol 55  0 - 99 mg/dL   Total CHOL/HDL Ratio 2     Comment:                Men          Women1/2 Average Risk     3.4          3.3Average Risk          5.0          4.42X Average Risk          9.6          7.13X Average Risk          15.0          11.0                       General Appearance: well nourished, obese and Maintained fair eye contact  Musculoskeletal: Strength & Muscle Tone: within normal limits Gait & Station: normal Patient leans: N/A  Psychiatric: Speech (describe rate, volume, coherence, spontaneity, and abnormalities if any): Slow soft but clear and coherent.  Normal tone and volume.  Thought Process (describe rate, content, abstract reasoning, and computation): Slow logical and organized.  Associations: Relevant and Intact  Thoughts: normal and Feeling overwhelmed.  Mental Status: Orientation: oriented to  person, place, time/date, situation, day of week and month of year Mood & Affect: depressed affect and anxiety Attention Span & Concentration: Fair  Medical Decision Making (Choose Three): Established Problem, Stable/Improving (1), Review of Psycho-Social Stressors (1), Review or order clinical lab tests (1), Review of Last Therapy Session (1) and Review of Medication Regimen & Side Effects (2)  Assessment: Axis I: Schizophrenia chronic paranoid type  Axis II: Deferred  Axis III: See medical history  Axis IV: Mild  Axis V: 60-65   Plan:  I review her recent blood work and current medication.  Recommend to continue Haldol 2 mg at bedtime.  Patient does not want to add or increase any psychiatric medication.  She does not have any tremors or shakes.  She is hoping to start the job on September 2 at [employer].  Recommend to call us back if she is any question or concern or if she feels worse to the symptom.  Followup in 3 months or sooner if needed.  Discuss safety plan that anytime having active suicidal thoughts or homicidal thoughts continue to call 911 or go to local emergency room.  I will see her again in 3 months.  Time spent 30 minutes.  More than 50% of the time spent and psychoeducation, counseling and coordination of care.    Fahim Kats T., MD 07/19/2013

## 2013-07-28 ENCOUNTER — Other Ambulatory Visit: Payer: Self-pay | Admitting: Internal Medicine

## 2013-07-30 ENCOUNTER — Other Ambulatory Visit: Payer: Self-pay | Admitting: *Deleted

## 2013-07-30 MED ORDER — AMLODIPINE BESYLATE 10 MG PO TABS: 10.0000 mg | ORAL_TABLET | Freq: Every day | ORAL | Status: DC

## 2013-08-02 ENCOUNTER — Other Ambulatory Visit: Payer: Self-pay | Admitting: *Deleted

## 2013-08-02 MED ORDER — CARVEDILOL 12.5 MG PO TABS: 12.5000 mg | ORAL_TABLET | Freq: Two times a day (BID) | ORAL | Status: DC

## 2013-08-03 ENCOUNTER — Other Ambulatory Visit: Payer: Self-pay

## 2013-08-03 MED ORDER — POTASSIUM CHLORIDE CRYS ER 20 MEQ PO TBCR: 20.0000 meq | EXTENDED_RELEASE_TABLET | Freq: Two times a day (BID) | ORAL | Status: DC

## 2013-08-07 ENCOUNTER — Ambulatory Visit (HOSPITAL_COMMUNITY): Payer: Self-pay | Admitting: Psychiatry

## 2013-08-17 ENCOUNTER — Telehealth: Payer: Self-pay | Admitting: *Deleted

## 2013-08-17 DIAGNOSIS — Z23 Encounter for immunization: Secondary | ICD-10-CM | POA: Diagnosis not present

## 2013-08-17 MED ORDER — ALBUTEROL SULFATE HFA 108 (90 BASE) MCG/ACT IN AERS: 2.0000 | INHALATION_SPRAY | RESPIRATORY_TRACT | Status: DC | PRN

## 2013-08-17 MED ORDER — MOMETASONE FUROATE 50 MCG/ACT NA SUSP: 2.0000 | Freq: Every day | NASAL | Status: DC

## 2013-08-17 NOTE — Telephone Encounter (Signed)
All you have to do is d/c that one and reorder it for 90 days. Ok to do that and nasonex

## 2013-08-17 NOTE — Telephone Encounter (Signed)
Pt called requesting a 90 day refill on Albuteral.  I am unable to refill medication per EPIC, it is a historical Rx.  Pt also requesting a 90 day refill on Nasonex. Please advise

## 2013-08-20 ENCOUNTER — Encounter: Payer: Self-pay | Admitting: Internal Medicine

## 2013-09-25 ENCOUNTER — Encounter (HOSPITAL_COMMUNITY): Payer: Self-pay | Admitting: Psychiatry

## 2013-09-25 ENCOUNTER — Ambulatory Visit (INDEPENDENT_AMBULATORY_CARE_PROVIDER_SITE_OTHER): Payer: Medicare Other | Admitting: Psychiatry

## 2013-09-25 DIAGNOSIS — F200 Paranoid schizophrenia: Secondary | ICD-10-CM | POA: Diagnosis not present

## 2013-09-25 MED ORDER — HALOPERIDOL 2 MG PO TABS: 2.0000 mg | ORAL_TABLET | Freq: Every day | ORAL | Status: DC

## 2013-09-25 NOTE — Progress Notes (Signed)
[facility] [id number] Progress Note  [name] [id number] 52 y.o.  09/25/2013 11:54 AM  Chief Complaint:  Followup.  History of Present Illness: Patient is 52 year old [place] American female who came for her followup appointment.  Patient is excited because she is able to get job at [employer] .  She would start to work from 27th .  She's compliant with Haldol.  She denies any irritability anger paranoia or any hallucination.  She wants to continue her current medication.  She refused vitals and weight because she is working on losing weight and does not want to get embarrassed .  She admitted some weight gain however she is trying to lose weight .  She sleeping better.  She denies any crying spells .  She is not drinking or using any illegal substance.  She has no shakes or tremors.    Suicidal Ideation: No Plan Formed: No Patient has means to carry out plan: No  Homicidal Ideation: No Plan Formed: No Patient has means to carry out plan: No  Review of Systems  Psychiatric/Behavioral: Negative for suicidal ideas, hallucinations and substance abuse. The patient does not have insomnia.     Psychiatric: Agitation: No Hallucination: No Depressed Mood: Yes Insomnia: No Hypersomnia: Yes Altered Concentration: No Feels Worthless: No Grandiose Ideas: No Belief In Special Powers: No New/Increased Substance Abuse: No Compulsions: No  Neurologic: Headache: No Seizure: No Paresthesias: No  Medical History: Patient has a hypertension, hyperlipidemia, and glucose intolerance, hypothyroidism and obesity.  She sees a Publishing rights manager at [employer] care.  Family history.  Patient admitted mother and other has schizophrenia. Both developed late onset schizophrenia. Her brother committed suicide . She has another brother who now she believe having some psychotic symptoms.   Psychosocial history.  Patient was born in [place]. She moved to [place]  in 1988 with her husband. She has 3 children and one step son. She's very close to her children and her family. She lives with her husband.   Education and work history.  Patient has a college education and recently she has done associate degree in paralegal. She is actively looking for a job.   Alcohol and substance use history.  Patient admitted history of drinking alcohol on and off until 2011. She claims to be sober since then. She also admitted smoking marijuana in her teenage. Her last use was 1984.   Outpatient Encounter Prescriptions as of 09/25/2013  Medication Sig Dispense Refill  . albuterol (VENTOLIN HFA) 108 (90 BASE) MCG/ACT inhaler Inhale 2 puffs into the lungs every 4 (four) hours as needed.  18 g  3  . amLODipine (NORVASC) 10 MG tablet Take 1 tablet (10 mg total) by mouth daily.  90 tablet  0  . atorvastatin (LIPITOR) 10 MG tablet Take 1 tablet (10 mg total) by mouth daily.  90 tablet  3  . carvedilol (COREG) 12.5 MG tablet Take 1 tablet (12.5 mg total) by mouth 2 (two) times daily with a meal.  60 tablet  5  . cetirizine (ZYRTEC) 10 MG tablet Take 10 mg by mouth daily.      . Fluticasone-Salmeterol (ADVAIR) 250-50 MCG/DOSE AEPB Inhale 1 puff into the lungs every 12 (twelve) hours.  180 each  2  . haloperidol (HALDOL) 2 MG tablet Take 1 tablet (2 mg total) by mouth at bedtime.  90 tablet  0  . hydrochlorothiazide (HYDRODIURIL) 25 MG tablet Take 1 tablet (25 mg total) by mouth daily.  90 tablet  2  . levothyroxine (SYNTHROID, LEVOTHROID) 50 MCG tablet Take 50 mcg by mouth daily.      Marland Kitchen lisinopril (PRINIVIL,ZESTRIL) 40 MG tablet Take 1 tablet (40 mg total) by mouth daily.  30 tablet  5  . mometasone (NASONEX) 50 MCG/ACT nasal spray Place 2 sprays into the nose daily.  17 g  3  . potassium chloride SA (KLOR-CON M20) 20 MEQ tablet Take 1 tablet (20 mEq total) by mouth 2 (two) times daily.  60 tablet  5  . [DISCONTINUED] haloperidol (HALDOL) 2 MG tablet Take 1 tablet (2 mg total) by  mouth at bedtime.  90 tablet  0   No facility-administered encounter medications on file as of 09/25/2013.    Past Psychiatric History/Hospitalization(s): Patient endorsed history of paranoia or delusion for a long time however she handled her illness very well until 2006.  She remembered exhibiting paranoia and delusion about her family members.  She felt that people are monitoring her through television.  She became very depressed suicidal and isolated.  She took overdose on her medication and acquire psychiatric admission at [facility].  She has a history of noncompliance with medication.  After 4 months she was again readmitted due to decompensation.  She was given Abilify which worked very well for her however she stopped taking because of expensive.  Patient denies any history of physical sexual verbal and emotional abuse. Anxiety: Yes Bipolar Disorder: No Depression: Yes Mania: No Psychosis: No Schizophrenia: Yes Personality Disorder: No Hospitalization for psychiatric illness: Yes History of Electroconvulsive Shock Therapy: No Prior Suicide Attempts: Yes  Physical Exam: Constitutional:  There were no vitals taken for this visit.  Recent Results (from the past 2160 hour(s))  COMPREHENSIVE METABOLIC PANEL     Status: Abnormal   Collection Time    07/13/13 10:32 AM      Result Value Range   Sodium 141  135 - 145 mEq/L   Potassium 3.9  3.5 - 5.1 mEq/L   Chloride 105  96 - 112 mEq/L   CO2 30  19 - 32 mEq/L   Glucose, Bld 111 (*) 70 - 99 mg/dL   BUN 16  6 - 23 mg/dL   Creatinine, Ser 1.3 (*) 0.4 - 1.2 mg/dL   Total Bilirubin 0.4  0.3 - 1.2 mg/dL   Alkaline Phosphatase 76  39 - 117 U/L   AST 21  0 - 37 U/L   ALT 22  0 - 35 U/L   Total Protein 7.3  6.0 - 8.3 g/dL   Albumin 3.7  3.5 - 5.2 g/dL   Calcium 9.5  8.4 - 96.0 mg/dL   GFR 45.40 (*) >98.11 mL/min  LIPID PANEL     Status: None   Collection Time    07/13/13 10:32 AM      Result Value Range   Cholesterol 133   0 - 200 mg/dL   Comment: ATP III Classification       Desirable:  < 200 mg/dL               Borderline High:  200 - 239 mg/dL          High:  > = 914 mg/dL   Triglycerides 782.9  0.0 - 149.0 mg/dL   Comment: Normal:  <562 mg/dLBorderline High:  150 - 199 mg/dL   HDL 13.08  >65.78 mg/dL   VLDL 46.9  0.0 - 62.9 mg/dL   LDL Cholesterol 55  0 - 99  mg/dL   Total CHOL/HDL Ratio 2     Comment:                Men          Women1/2 Average Risk     3.4          3.3Average Risk          5.0          4.42X Average Risk          9.6          7.13X Average Risk          15.0          11.0                       General Appearance: well nourished, obese and Maintained fair eye contact  Musculoskeletal: Strength & Muscle Tone: within normal limits Gait & Station: normal Patient leans: N/A  Psychiatric: Speech (describe rate, volume, coherence, spontaneity, and abnormalities if any): Slow soft but clear and coherent.  Normal tone and volume.  Thought Process (describe rate, content, abstract reasoning, and computation): Slow logical and organized.  Associations: Relevant and Intact  Thoughts: normal and Feeling overwhelmed.  Mental Status: Orientation: oriented to person, place, time/date, situation, day of week and month of year Mood & Affect: depressed affect and anxiety Attention Span & Concentration: Fair  Medical Decision Making (Choose Three): Established Problem, Stable/Improving (1), Review of Psycho-Social Stressors (1), Review of Last Therapy Session (1) and Review of Medication Regimen & Side Effects (2)  Assessment: Axis I: Schizophrenia chronic paranoid type  Axis II: Deferred  Axis III: See medical history  Axis IV: Mild  Axis V: 60-65   Plan:  I will continue Haldol 2 mg at bedtime.  Patient does not have any side effects.  She refused vitals and weight because she is working on her weight loss program.  I will see her again in 3 months.  Recommend recalls back if she is  any question of any concern.   Ellarose Brandi T., MD 09/25/2013

## 2013-10-19 ENCOUNTER — Ambulatory Visit (HOSPITAL_COMMUNITY): Payer: Self-pay | Admitting: Psychiatry

## 2013-10-27 ENCOUNTER — Encounter: Payer: Self-pay | Admitting: Family Medicine

## 2013-10-27 ENCOUNTER — Ambulatory Visit (INDEPENDENT_AMBULATORY_CARE_PROVIDER_SITE_OTHER): Payer: Medicare Other | Admitting: Family Medicine

## 2013-10-27 VITALS — BP 130/78 | HR 72 | Temp 98.2°F | Resp 16 | Wt 237.0 lb

## 2013-10-27 DIAGNOSIS — J019 Acute sinusitis, unspecified: Secondary | ICD-10-CM | POA: Diagnosis not present

## 2013-10-27 MED ORDER — AMOXICILLIN-POT CLAVULANATE 875-125 MG PO TABS: 1.0000 | ORAL_TABLET | Freq: Two times a day (BID) | ORAL | Status: DC

## 2013-10-27 MED ORDER — METHYLPREDNISOLONE ACETATE 80 MG/ML IJ SUSP
80.0000 mg | Freq: Once | INTRAMUSCULAR | Status: AC
  Administered 2013-10-27: 80 mg via INTRAMUSCULAR

## 2013-10-27 MED ORDER — PREDNISONE 10 MG PO TABS: ORAL_TABLET | ORAL | Status: DC

## 2013-10-27 NOTE — Progress Notes (Signed)
  Subjective:     Maria Franklin is a 52 y.o. female who presents for evaluation of sinus pain. Symptoms include: congestion, facial pain, headaches, nasal congestion, post nasal drip and sinus pressure. Onset of symptoms was 4 weeks ago. Symptoms have been gradually worsening since that time. Past history is significant for asthma. Patient is a non-smoker. Pt used albuterol several times on the way to office today.    The following portions of the patient's history were reviewed and updated as appropriate: allergies, current medications, past family history, past medical history, past social history, past surgical history and problem list.  Review of Systems Pertinent items are noted in HPI.   Objective:    BP 130/78  Pulse 72  Temp(Src) 98.2 F (36.8 C) (Oral)  Resp 16  Wt 237 lb (107.502 kg)  SpO2 98%  LMP 10/13/2013 General appearance: alert, cooperative, appears stated age and no distress Ears: normal TM's and external ear canals both ears Nose: green discharge, moderate congestion, turbinates red, swollen, sinus tenderness bilateral Throat: lips, mucosa, and tongue normal; teeth and gums normal Neck: mild anterior cervical adenopathy, supple, symmetrical, trachea midline and thyroid not enlarged, symmetric, no tenderness/mass/nodules Lungs: wheezes bilaterally Heart: S1, S2 normal    Assessment:    Acute bacterial sinusitis Asthma exacerbation.    Plan:    Nasal steroids per medication orders. Antihistamines per medication orders. Augmentin per medication orders. pred taper  Depo medrol given in office con't albuterol-- pt told to not use more than 4x a day con't advair bid F/u next week if no better or go to ER if symptoms worsen

## 2013-10-27 NOTE — Patient Instructions (Signed)

## 2013-11-21 ENCOUNTER — Other Ambulatory Visit: Payer: Self-pay | Admitting: *Deleted

## 2013-11-21 MED ORDER — LEVOTHYROXINE SODIUM 50 MCG PO TABS: 50.0000 ug | ORAL_TABLET | Freq: Every day | ORAL | Status: DC

## 2013-12-12 ENCOUNTER — Other Ambulatory Visit: Payer: Self-pay | Admitting: Internal Medicine

## 2013-12-12 MED ORDER — LISINOPRIL 40 MG PO TABS: 40.0000 mg | ORAL_TABLET | Freq: Every day | ORAL | Status: DC

## 2013-12-12 MED ORDER — AMLODIPINE BESYLATE 10 MG PO TABS: 10.0000 mg | ORAL_TABLET | Freq: Every day | ORAL | Status: DC

## 2013-12-25 ENCOUNTER — Emergency Department (HOSPITAL_COMMUNITY)
Admission: EM | Admit: 2013-12-25 | Discharge: 2013-12-25 | Disposition: A | Discharge status: Home / Self Care | Payer: Medicare Other | Attending: Emergency Medicine | Admitting: Emergency Medicine

## 2013-12-25 ENCOUNTER — Encounter (HOSPITAL_COMMUNITY): Payer: Self-pay | Admitting: Emergency Medicine

## 2013-12-25 ENCOUNTER — Emergency Department (HOSPITAL_COMMUNITY): Payer: Medicare Other

## 2013-12-25 DIAGNOSIS — Z79899 Other long term (current) drug therapy: Secondary | ICD-10-CM | POA: Insufficient documentation

## 2013-12-25 DIAGNOSIS — IMO0002 Reserved for concepts with insufficient information to code with codable children: Secondary | ICD-10-CM | POA: Insufficient documentation

## 2013-12-25 DIAGNOSIS — I10 Essential (primary) hypertension: Secondary | ICD-10-CM | POA: Diagnosis not present

## 2013-12-25 DIAGNOSIS — J45901 Unspecified asthma with (acute) exacerbation: Secondary | ICD-10-CM | POA: Diagnosis not present

## 2013-12-25 DIAGNOSIS — Z87891 Personal history of nicotine dependence: Secondary | ICD-10-CM | POA: Diagnosis not present

## 2013-12-25 DIAGNOSIS — R062 Wheezing: Secondary | ICD-10-CM | POA: Diagnosis not present

## 2013-12-25 DIAGNOSIS — E079 Disorder of thyroid, unspecified: Secondary | ICD-10-CM | POA: Diagnosis not present

## 2013-12-25 DIAGNOSIS — E785 Hyperlipidemia, unspecified: Secondary | ICD-10-CM | POA: Diagnosis not present

## 2013-12-25 DIAGNOSIS — Z8619 Personal history of other infectious and parasitic diseases: Secondary | ICD-10-CM | POA: Insufficient documentation

## 2013-12-25 DIAGNOSIS — R0602 Shortness of breath: Secondary | ICD-10-CM | POA: Diagnosis not present

## 2013-12-25 LAB — CBC
HEMATOCRIT: 33.9 % — AB (ref 36.0–46.0)
HEMOGLOBIN: 11.2 g/dL — AB (ref 12.0–15.0)
MCH: 28.1 pg (ref 26.0–34.0)
MCHC: 33 g/dL (ref 30.0–36.0)
MCV: 85.2 fL (ref 78.0–100.0)
Platelets: 221 10*3/uL (ref 150–400)
RBC: 3.98 MIL/uL (ref 3.87–5.11)
RDW: 13.7 % (ref 11.5–15.5)
WBC: 6.4 10*3/uL (ref 4.0–10.5)

## 2013-12-25 LAB — COMPREHENSIVE METABOLIC PANEL
ALK PHOS: 85 U/L (ref 39–117)
ALT: 24 U/L (ref 0–35)
AST: 20 U/L (ref 0–37)
Albumin: 3.4 g/dL — ABNORMAL LOW (ref 3.5–5.2)
BUN: 13 mg/dL (ref 6–23)
CHLORIDE: 100 meq/L (ref 96–112)
CO2: 26 mEq/L (ref 19–32)
Calcium: 9.1 mg/dL (ref 8.4–10.5)
Creatinine, Ser: 1.07 mg/dL (ref 0.50–1.10)
GFR calc non Af Amer: 59 mL/min — ABNORMAL LOW (ref >90)
GFR, EST AFRICAN AMERICAN: 68 mL/min — AB (ref >90)
GLUCOSE: 115 mg/dL — AB (ref 70–99)
POTASSIUM: 3.8 meq/L (ref 3.7–5.3)
Sodium: 139 mEq/L (ref 137–147)
Total Protein: 7.7 g/dL (ref 6.0–8.3)

## 2013-12-25 MED ORDER — PREDNISONE 10 MG PO TABS: 20.0000 mg | ORAL_TABLET | Freq: Every day | ORAL | Status: DC

## 2013-12-25 MED ORDER — ALBUTEROL (5 MG/ML) CONTINUOUS INHALATION SOLN
10.0000 mg/h | INHALATION_SOLUTION | Freq: Once | RESPIRATORY_TRACT | Status: AC
Start: 2013-12-25 — End: 2013-12-25
  Administered 2013-12-25: 10 mg/h via RESPIRATORY_TRACT
  Filled 2013-12-25: qty 40

## 2013-12-25 MED ORDER — AZITHROMYCIN 250 MG PO TABS: 250.0000 mg | ORAL_TABLET | Freq: Every day | ORAL | Status: DC

## 2013-12-25 MED ORDER — PREDNISONE 20 MG PO TABS
60.0000 mg | ORAL_TABLET | Freq: Once | ORAL | Status: AC
  Administered 2013-12-25: 60 mg via ORAL
  Filled 2013-12-25: qty 3

## 2013-12-25 MED ORDER — SODIUM CHLORIDE 0.9 % IV BOLUS (SEPSIS): 500.0000 mL | Freq: Once | INTRAVENOUS | Status: DC

## 2013-12-25 MED ORDER — IPRATROPIUM BROMIDE 0.02 % IN SOLN
RESPIRATORY_TRACT | Status: AC
  Administered 2013-12-25: 0.5 mg
  Filled 2013-12-25: qty 2.5

## 2013-12-25 MED ORDER — ALBUTEROL SULFATE (2.5 MG/3ML) 0.083% IN NEBU
INHALATION_SOLUTION | RESPIRATORY_TRACT | Status: AC
  Administered 2013-12-25: 5 mg
  Filled 2013-12-25: qty 6

## 2013-12-25 MED ORDER — ALBUTEROL (5 MG/ML) CONTINUOUS INHALATION SOLN
INHALATION_SOLUTION | RESPIRATORY_TRACT | Status: AC
  Filled 2013-12-25: qty 20

## 2013-12-25 NOTE — Discharge Instructions (Signed)
Asthma, Adult Asthma is a recurring condition in which the airways tighten and narrow. Asthma can make it difficult to breathe. It can cause coughing, wheezing, and shortness of breath. Asthma episodes (also called asthma attacks) range from minor to life-threatening. Asthma cannot be cured, but medicines and lifestyle changes can help control it. CAUSES Asthma is believed to be caused by inherited (genetic) and environmental factors, but its exact cause is unknown. Asthma may be triggered by allergens, lung infections, or irritants in the air. Asthma triggers are different for each person. Common triggers include:   Animal dander.  Dust mites.  Cockroaches.  Pollen from trees or grass.  Mold.  Smoke.  Air pollutants such as dust, household cleaners, hair sprays, aerosol sprays, paint fumes, strong chemicals, or strong odors.  Cold air, weather changes, and winds (which increase molds and pollens in the air).  Strong emotional expressions such as crying or laughing hard.  Stress.  Certain medicines (such as aspirin) or types of drugs (such as beta-blockers).  Sulfites in foods and drinks. Foods and drinks that may contain sulfites include dried fruit, potato chips, and sparkling grape juice.  Infections or inflammatory conditions such as the flu, a cold, or an inflammation of the nasal membranes (rhinitis).  Gastroesophageal reflux disease (GERD).  Exercise or strenuous activity. SYMPTOMS Symptoms may occur immediately after asthma is triggered or many hours later. Symptoms include:  Wheezing.  Excessive nighttime or early morning coughing.  Frequent or severe coughing with a common cold.  Chest tightness.  Shortness of breath. DIAGNOSIS  The diagnosis of asthma is made by a review of your medical history and a physical exam. Tests may also be performed. These may include:  Lung function studies. These tests show how much air you breath in and out.  Allergy  tests.  Imaging tests such as X-rays. TREATMENT  Asthma cannot be cured, but it can usually be controlled. Treatment involves identifying and avoiding your asthma triggers. It also involves medicines. There are 2 classes of medicine used for asthma treatment:   Controller medicines. These prevent asthma symptoms from occurring. They are usually taken every day.  Reliever or rescue medicines. These quickly relieve asthma symptoms. They are used as needed and provide short-term relief. Your health care provider will help you create an asthma action plan. An asthma action plan is a written plan for managing and treating your asthma attacks. It includes a list of your asthma triggers and how they may be avoided. It also includes information on when medicines should be taken and when their dosage should be changed. An action plan may also involve the use of a device called a peak flow meter. A peak flow meter measures how well the lungs are working. It helps you monitor your condition. HOME CARE INSTRUCTIONS   Take medicine as directed by your health care provider. Speak with your health care provider if you have questions about how or when to take the medicines.  Use a peak flow meter as directed by your health care provider. Record and keep track of readings.  Understand and use the action plan to help minimize or stop an asthma attack without needing to seek medical care.  Control your home environment in the following ways to help prevent asthma attacks:  Do not smoke. Avoid being exposed to secondhand smoke.  Change your heating and air conditioning filter regularly.  Limit your use of fireplaces and wood stoves.  Get rid of pests (such as roaches and  mice) and their droppings.  Throw away plants if you see mold on them.  Clean your floors and dust regularly. Use unscented cleaning products.  Try to have someone else vacuum for you regularly. Stay out of rooms while they are being  vacuumed and for a short while afterward. If you vacuum, use a dust mask from a hardware store, a double-layered or microfilter vacuum cleaner bag, or a vacuum cleaner with a HEPA filter.  Replace carpet with wood, tile, or vinyl flooring. Carpet can trap dander and dust.  Use allergy-proof pillows, mattress covers, and box spring covers.  Wash bed sheets and blankets every week in hot water and dry them in a dryer.  Use blankets that are made of polyester or cotton.  Clean bathrooms and kitchens with bleach. If possible, have someone repaint the walls in these rooms with mold-resistant paint. Keep out of the rooms that are being cleaned and painted.  Wash hands frequently. SEEK MEDICAL CARE IF:   You have wheezing, shortness of breath, or a cough even if taking medicine to prevent attacks.  The colored mucus you cough up (sputum) is thicker than usual.  Your sputum changes from clear or white to yellow, green, gray, or bloody.  You have any problems that may be related to the medicines you are taking (such as a rash, itching, swelling, or trouble breathing).  You are using a reliever medicine more than 2 3 times per week.  Your peak flow is still at 50 79% of you personal best after following your action plan for 1 hour. SEEK IMMEDIATE MEDICAL CARE IF:   You seem to be getting worse and are unresponsive to treatment during an asthma attack.  You are short of breath even at rest.  You get short of breath when doing very little physical activity.  You have difficulty eating, drinking, or talking due to asthma symptoms.  You develop chest pain.  You develop a fast heartbeat.  You have a bluish color to your lips or fingernails.  You are lightheaded, dizzy, or faint.  Your peak flow is less than 50% of your personal best.  You have a fever or persistent symptoms for more than 2 3 days.  You have a fever and symptoms suddenly get worse. MAKE SURE YOU:   Understand these  instructions.  Will watch your condition.  Will get help right away if you are not doing well or get worse. Document Released: 11/22/2005 Document Revised: 07/25/2013 Document Reviewed: 06/21/2013 [facility] Patient Information 2014 [place], [place].  Asthma Attack Prevention Although there is no way to prevent asthma from starting, you can take steps to control the disease and reduce its symptoms. Learn about your asthma and how to control it. Take an active role to control your asthma by working with your health care provider to create and follow an asthma action plan. An asthma action plan guides you in:  Taking your medicines properly.  Avoiding things that set off your asthma or make your asthma worse (asthma triggers).  Tracking your level of asthma control.  Responding to worsening asthma.  Seeking emergency care when needed. To track your asthma, keep records of your symptoms, check your peak flow number using a handheld device that shows how well air moves out of your lungs (peak flow meter), and get regular asthma checkups.  WHAT ARE SOME WAYS TO PREVENT AN ASTHMA ATTACK?  Take medicines as directed by your health care provider.  Keep track of your asthma  symptoms and level of control.  With your health care provider, write a detailed plan for taking medicines and managing an asthma attack. Then be sure to follow your action plan. Asthma is an ongoing condition that needs regular monitoring and treatment.  Identify and avoid asthma triggers. Many outdoor allergens and irritants (such as pollen, mold, cold air, and air pollution) can trigger asthma attacks. Find out what your asthma triggers are and take steps to avoid them.  Monitor your breathing. Learn to recognize warning signs of an attack, such as coughing, wheezing, or shortness of breath. Your lung function may decrease before you notice any signs or symptoms, so regularly measure and record your peak airflow with a home  peak flow meter.  Identify and treat attacks early. If you act quickly, you are less likely to have a severe attack. You will also need less medicine to control your symptoms. When your peak flow measurements decrease and alert you to an upcoming attack, take your medicine as instructed and immediately stop any activity that may have triggered the attack. If your symptoms do not improve, get medical help.  Pay attention to increasing quick-relief inhaler use. If you find yourself relying on your quick-relief inhaler, your asthma is not under control. See your health care provider about adjusting your treatment. WHAT CAN MAKE MY SYMPTOMS WORSE? A number of common things can set off or make your asthma symptoms worse and cause temporary increased inflammation of your airways. Keep track of your asthma symptoms for several weeks, detailing all the environmental and emotional factors that are linked with your asthma. When you have an asthma attack, go back to your asthma diary to see which factor, or combination of factors, might have contributed to it. Once you know what these factors are, you can take steps to control many of them. If you have allergies and asthma, it is important to take asthma prevention steps at home. Minimizing contact with the substance to which you are allergic will help prevent an asthma attack. Some triggers and ways to avoid these triggers are: Animal Dander:  Some people are allergic to the flakes of skin or dried saliva from animals with fur or feathers.   There is no such thing as a hypoallergenic dog or cat breed. All dogs or cats can cause allergies, even if they don't shed.  Keep these pets out of your home.  If you are not able to keep a pet outdoors, keep the pet out of your bedroom and other sleeping areas at all times, and keep the door closed.  Remove carpets and furniture covered with cloth from your home. If that is not possible, keep the pet away from  fabric-covered furniture and carpets. Dust Mites: Many people with asthma are allergic to dust mites. Dust mites are tiny bugs that are found in every home in mattresses, pillows, carpets, fabric-covered furniture, bedcovers, clothes, stuffed toys, and other fabric-covered items.   Cover your mattress in a special dust-proof cover.  Cover your pillow in a special dust-proof cover, or wash the pillow each week in hot water. Water must be hotter than 130 F (54.4 C) to kill dust mites. Cold or warm water used with detergent and bleach can also be effective.  Wash the sheets and blankets on your bed each week in hot water.  Try not to sleep or lie on cloth-covered cushions.  Call ahead when traveling and ask for a smoke-free hotel room. Bring your own bedding and pillows  in case the hotel only supplies feather pillows and down comforters, which may contain dust mites and cause asthma symptoms.  Remove carpets from your bedroom and those laid on concrete, if you can.  Keep stuffed toys out of the bed, or wash the toys weekly in hot water or cooler water with detergent and bleach. Cockroaches: Many people with asthma are allergic to the droppings and remains of cockroaches.   Keep food and garbage in closed containers. Never leave food out.  Use poison baits, traps, powders, gels, or paste (for example, boric acid).  If a spray is used to kill cockroaches, stay out of the room until the odor goes away. Indoor Mold:  Fix leaky faucets, pipes, or other sources of water that have mold around them.  Clean floors and moldy surfaces with a fungicide or diluted bleach.  Avoid using humidifiers, vaporizers, or swamp coolers. These can spread molds through the air. Pollen and Outdoor Mold:  When pollen or mold spore counts are high, try to keep your windows closed.  Stay indoors with windows closed from late morning to afternoon. Pollen and some mold spore counts are highest at that  time.  Ask your health care provider whether you need to take anti-inflammatory medicine or increase your dose of the medicine before your allergy season starts. Other Irritants to Avoid:  Tobacco smoke is an irritant. If you smoke, ask your health care provider how you can quit. Ask family members to quit smoking too. Do not allow smoking in your home or car.  If possible, do not use a wood-burning stove, kerosene heater, or fireplace. Minimize exposure to all sources of smoke, including to incense, candles, fires, and fireworks.  Try to stay away from strong odors and sprays, such as perfume, talcum powder, hair spray, and paints.  Decrease humidity in your home and use an indoor air cleaning device. Reduce indoor humidity to below 60%. Dehumidifiers or central air conditioners can do this.  Decrease house dust exposure by changing furnace and air cooler filters frequently.  Try to have someone else vacuum for you once or twice a week. Stay out of rooms while they are being vacuumed and for a short while afterward.  If you vacuum, use a dust mask from a hardware store, a double-layered or microfilter vacuum cleaner bag, or a vacuum cleaner with a HEPA filter.  Sulfites in foods and beverages can be irritants. Do not drink beer or wine or eat dried fruit, processed potatoes, or shrimp if they cause asthma symptoms.  Cold air can trigger an asthma attack. Cover your nose and mouth with a scarf on cold or windy days.  Several health conditions can make asthma more difficult to manage, including a runny nose, sinus infections, reflux disease, psychological stress, and sleep apnea. Work with your health care provider to manage these conditions.  Avoid close contact with people who have a respiratory infection such as a cold or the flu, since your asthma symptoms may get worse if you catch the infection. Wash your hands thoroughly after touching items that may have been handled by people with a  respiratory infection.  Get a flu shot every year to protect against the flu virus, which often makes asthma worse for days or weeks. Also get a pneumonia shot if you have not previously had one. Unlike the flu shot, the pneumonia shot does not need to be given yearly. Medicines:  Talk to your health care provider about whether it is safe  for you to take aspirin or non-steroidal anti-inflammatory medicines (NSAIDs). In a small number of people with asthma, aspirin and NSAIDs can cause asthma attacks. These medicines must be avoided by people who have known aspirin-sensitive asthma. It is important that people with aspirin-sensitive asthma read labels of all over-the-counter medicines used to treat pain, colds, coughs, and fever.  Beta blockers and ACE inhibitors are other medicines you should discuss with your health care provider. HOW CAN I FIND OUT WHAT I AM ALLERGIC TO? Ask your asthma health care provider about allergy skin testing or blood testing (the RAST test) to identify the allergens to which you are sensitive. If you are found to have allergies, the most important thing to do is to try to avoid exposure to any allergens that you are sensitive to as much as possible. Other treatments for allergies, such as medicines and allergy shots (immunotherapy) are available.  CAN I EXERCISE? Follow your health care provider's advice regarding asthma treatment before exercising. It is important to maintain a regular exercise program, but vigorous exercise, or exercise in cold, humid, or dry environments can cause asthma attacks, especially for those people who have exercise-induced asthma. Document Released: 11/10/2009 Document Revised: 07/25/2013 Document Reviewed: 05/30/2013 [facility] Patient Information 2014 [place].

## 2013-12-25 NOTE — ED Notes (Signed)
Wheezing and sob since yesterdfay tight in chest

## 2013-12-25 NOTE — ED Provider Notes (Signed)
CSN: [id number]     Arrival date & time 12/25/13  29 History   First MD Initiated Contact with Patient 12/25/13 1952     Chief Complaint  Patient presents with  . Wheezing   (Consider location/radiation/quality/duration/timing/severity/associated sxs/prior Treatment) HPI  Patient is a 53 year old female who presents today complaining of wheezing and SOB x 2 days.  She states that she had a cold a few weeks ago but started feeling "bad" yesterday and was having trouble breathing.  She felt that she was unable to breathe last night while trying to sleep and that the shortness of breath has gradually worsened.  Deep breathing and exertion worsen her symptoms.  She used her albuterol inhaler twice and Motrin with minimal relief.  Patient has hx of asthma and states that it is typically well-controlled.  At this time, patient admits SOB, wheezing, cough with occasional sputum and chills.  Denies fever, CP, n/v/d, nasal congestion, and feeling diaphoretic. Vitals signs WNL although patient is a little tachycardic after nebulizer treatment.  Past Medical History  Diagnosis Date  . Asthma   . Chicken pox   . Allergy   . Hypertension   . Hyperlipidemia   . Thyroid disease    Past Surgical History  Procedure Laterality Date  . Sinus irrigation    . Tonsillectomy     Family History  Problem Relation Age of Onset  . Mental illness Mother   . Hypertension Mother   . Schizophrenia Mother   . Cancer Father     LUNG  . Cancer Sister     BREAST  . Mental illness Brother   . Hypertension Brother   . Schizophrenia Brother   . Heart disease Maternal Grandmother   . Schizophrenia Brother    History  Substance Use Topics  . Smoking status: Former Research scientist (life sciences)  . Smokeless tobacco: Not on file  . Alcohol Use: No   OB History   Grav Para Term Preterm Abortions TAB SAB Ect Mult Living                 Review of Systems The patient denies anorexia, fever, weight loss,, vision loss, decreased  hearing, hoarseness, chest pain, syncope, dyspnea on exertion, peripheral edema, balance deficits, hemoptysis, abdominal pain, melena, hematochezia, severe indigestion/heartburn, hematuria, incontinence, genital sores, muscle weakness, suspicious skin lesions, transient blindness, difficulty walking, depression, unusual weight change, abnormal bleeding, enlarged lymph nodes, angioedema, and breast masses.  Allergies  Review of patient's allergies indicates no known allergies.  Home Medications   Current Outpatient Rx  Name  Route  Sig  Dispense  Refill  . albuterol (PROVENTIL HFA;VENTOLIN HFA) 108 (90 BASE) MCG/ACT inhaler   Inhalation   Inhale 1-2 puffs into the lungs every 6 (six) hours as needed for wheezing or shortness of breath.         Marland Kitchen amLODipine (NORVASC) 10 MG tablet   Oral   Take 10 mg by mouth daily.         Marland Kitchen atorvastatin (LIPITOR) 10 MG tablet   Oral   Take 10 mg by mouth daily.         . B Complex-C (B-COMPLEX WITH VITAMIN C) tablet   Oral   Take 1 tablet by mouth daily.         . carvedilol (COREG) 12.5 MG tablet   Oral   Take 12.5 mg by mouth 2 (two) times daily with a meal.         . cetirizine (ZYRTEC) 10  MG tablet   Oral   Take 10 mg by mouth daily.         . Fluticasone-Salmeterol (ADVAIR) 250-50 MCG/DOSE AEPB   Inhalation   Inhale 1 puff into the lungs 2 (two) times daily.         Marland Kitchen guaiFENesin (ROBITUSSIN) 100 MG/5ML SOLN   Oral   Take 10 mLs by mouth every 6 (six) hours as needed for cough or to loosen phlegm.         . haloperidol (HALDOL) 2 MG tablet   Oral   Take 2 mg by mouth at bedtime.         . hydrochlorothiazide (HYDRODIURIL) 25 MG tablet   Oral   Take 25 mg by mouth daily.         Marland Kitchen ibuprofen (ADVIL,MOTRIN) 200 MG tablet   Oral   Take 400 mg by mouth every 8 (eight) hours as needed for mild pain.         Marland Kitchen levothyroxine (SYNTHROID, LEVOTHROID) 50 MCG tablet   Oral   Take 50 mcg by mouth daily before  breakfast.         . lisinopril (PRINIVIL,ZESTRIL) 40 MG tablet   Oral   Take 40 mg by mouth every evening.         . mometasone (NASONEX) 50 MCG/ACT nasal spray   Nasal   Place 2 sprays into the nose daily.         . Omega-3 Fatty Acids (FISH OIL PO)   Oral   Take 1 capsule by mouth daily.         . potassium chloride SA (K-DUR,KLOR-CON) 20 MEQ tablet   Oral   Take 20 mEq by mouth 2 (two) times daily.         Marland Kitchen azithromycin (ZITHROMAX) 250 MG tablet   Oral   Take 1 tablet (250 mg total) by mouth daily. Take first 2 tablets together, then 1 every day until finished.   6 tablet   0   . predniSONE (DELTASONE) 10 MG tablet   Oral   Take 2 tablets (20 mg total) by mouth daily.   21 tablet   0     Prednisone dose pack directions:   6 tabs on day ...    BP 124/77  Pulse 106  Temp(Src) 100 F (37.8 C) (Oral)  Resp 17  Wt 234 lb 1 oz (106.17 kg)  SpO2 92%  LMP 11/29/2013 Physical Exam  Nursing note and vitals reviewed. Constitutional: She appears well-developed and well-nourished. No distress.  HENT:  Head: Normocephalic and atraumatic.  Eyes: Pupils are equal, round, and reactive to light.  Neck: Normal range of motion. Neck supple.  Cardiovascular: Normal rate and regular rhythm.   Pulmonary/Chest: She is in respiratory distress (mild). She has wheezes. She has no rales.  Coughing during exam  Abdominal: Soft.  Neurological: She is alert.  Skin: Skin is warm and dry.    ED Course  Procedures (including critical care time) Labs Review Labs Reviewed  CBC - Abnormal; Notable for the following:    Hemoglobin 11.2 (*)    HCT 33.9 (*)    All other components within normal limits  COMPREHENSIVE METABOLIC PANEL - Abnormal; Notable for the following:    Glucose, Bld 115 (*)    Albumin 3.4 (*)    Total Bilirubin <0.2 (*)    GFR calc non Af Amer 59 (*)    GFR calc Af Amer 68 (*)  All other components within normal limits   Imaging Review Dg Chest  2 View  12/25/2013   CLINICAL DATA:  Wheezing  EXAM: CHEST  2 VIEW  COMPARISON:  None.  FINDINGS: Cardiomediastinal silhouette is unremarkable. No acute infiltrate or pleural effusion. No pulmonary edema. Bony thorax is unremarkable. Mild degenerative changes thoracic spine.  IMPRESSION: No active cardiopulmonary disease.   Electronically Signed   By: Lahoma Crocker M.D.   On: 12/25/2013 16:49    EKG Interpretation   None       MDM   1. Asthma exacerbation      CRITICAL CARE Performed by: Linus Mako Total critical care time: 30 min Critical care time was exclusive of separately billable procedures and treating other patients. Critical care was necessary to treat or prevent imminent or life-threatening deterioration. Critical care was time spent personally by me on the following activities: development of treatment plan with patient and/or surrogate as well as nursing, discussions with consultants, evaluation of patient's response to treatment, examination of patient, obtaining history from patient or surrogate, ordering and performing treatments and interventions, ordering and review of laboratory studies, ordering and review of radiographic studies, pulse oximetry and re-evaluation of patient's condition.  Patient required hour long nebulizer. She was given 1 breathing treatment in triage and said it did not help. She is maintaining her oxygen saturations on room air.  After nebulizer treatment patient is moving air much more effeciently. No longer has increased work rate of breathing. Oxygen saturation is 98%. She has an albuterol inhaler at home. Nurse is concerned about low temp and patient endorses coughing and runny nose.  Will Rx: Prednisone taper and Azithromycin.  She can follow-up with PCP.  53 y.o.Almedia Balls Frieden's evaluation in the Emergency Department is complete. It has been determined that no acute conditions requiring further emergency intervention are present at this  time. The patient/guardian have been advised of the diagnosis and plan. We have discussed signs and symptoms that warrant return to the ED, such as changes or worsening in symptoms.  Vital signs are stable at discharge. Filed Vitals:   12/25/13 2300  BP: 124/77  Pulse: 106  Temp:   Resp: 17    Patient/guardian has voiced understanding and agreed to follow-up with the PCP or specialist.    Linus Mako, PA-C 12/25/13 2346

## 2013-12-26 ENCOUNTER — Telehealth (HOSPITAL_COMMUNITY): Payer: Self-pay

## 2013-12-26 MED FILL — Albuterol Sulfate Soln Nebu 0.5% (5 MG/ML): RESPIRATORY_TRACT | Qty: 20 | Status: AC

## 2013-12-26 NOTE — ED Notes (Signed)
Pharmacy calling for Rx clarification for prednisone and zithromax.  Clarification provided

## 2013-12-26 NOTE — Progress Notes (Signed)
Incoming call from [employer] [phone] clarification needed on Deltasone prescription.Correction sheet completed will place in [employer] file for PA review.

## 2013-12-28 ENCOUNTER — Ambulatory Visit (INDEPENDENT_AMBULATORY_CARE_PROVIDER_SITE_OTHER): Payer: Medicare Other | Admitting: Psychiatry

## 2013-12-28 ENCOUNTER — Encounter (HOSPITAL_COMMUNITY): Payer: Self-pay | Admitting: Psychiatry

## 2013-12-28 VITALS — Wt 235.0 lb

## 2013-12-28 DIAGNOSIS — F200 Paranoid schizophrenia: Secondary | ICD-10-CM | POA: Diagnosis not present

## 2013-12-28 DIAGNOSIS — F411 Generalized anxiety disorder: Secondary | ICD-10-CM

## 2013-12-28 DIAGNOSIS — F419 Anxiety disorder, unspecified: Secondary | ICD-10-CM

## 2013-12-28 MED ORDER — HYDROXYZINE PAMOATE 25 MG PO CAPS: 25.0000 mg | ORAL_CAPSULE | Freq: Every evening | ORAL | Status: DC | PRN

## 2013-12-28 MED ORDER — HALOPERIDOL 2 MG PO TABS
2.0000 mg | ORAL_TABLET | Freq: Every day | ORAL | Status: DC
Start: 2013-12-28 — End: 2014-03-28

## 2013-12-28 NOTE — Progress Notes (Signed)
[facility] [phone] Progress Note  Maria Franklin [id number] 53 y.o.  12/28/2013 9:29 AM  Chief Complaint:  I'm having a lot of anxiety and poor sleep.    History of Present Illness: Maria Franklin came for her followup appointment.  She appears very anxious and easily irritable.  She quit working after 2 weeks because she could not handle the stress.  She felt increased paranoia , delusions and feeling overwhelmed.  Recently she feels her anxiety is getting worse.  She is in the process of buying a house and gets very overwhelmed.  She also not seeing therapist because she does not like and like to get somebody else.  Patient told that lately she's been sick and getting antibiotic .  She admitted sometime very irritable angry but denies any hallucination or any paranoia.  She feels this is her anxiety which is causing insomnia and irritability.  She denies any suicidal thoughts or homicidal thoughts.  She is disappointed because she has to quit work.  She feels that since they will get the house she has to work otherwise it will be more financial burden to her husband.  She is taking low-dose Haldol and then I ask about increasing the dose she got very upset and does not want to change or increase the medication.  She is only taking 2 mg at bedtime.  She admitted that she start working at [employer] she did increase Haldol 4 mg a few days and when she quit the job she went back to one tablet a day.  Patient denies any tremors, shakes, crying spells.  She is not drinking or using any illegal substances.  She is taking antibiotics and steroids for her upper respiratory infection.  Suicidal Ideation: No Plan Formed: No Patient has means to carry out plan: No  Homicidal Ideation: No Plan Formed: No Patient has means to carry out plan: No  ROS  Psychiatric: Agitation: Yes Hallucination: No Depressed Mood: Yes Insomnia: Yes Hypersomnia: No Altered Concentration: No Feels Worthless:  No Grandiose Ideas: No Belief In Special Powers: No New/Increased Substance Abuse: No Compulsions: No  Neurologic: Headache: No Seizure: No Paresthesias: No  Medical History:  Patient has a hypertension, hyperlipidemia, and glucose intolerance, hypothyroidism and obesity.  She sees a Designer, jewellery at [employer] [facility].  Psychosocial history.  Patient was born in [place]. She moved to [place] in 1988 with her husband. She has 3 children and one step son. She's very close to her children and her family. She lives with her husband.   Outpatient Encounter Prescriptions as of 12/28/2013  Medication Sig  . albuterol (PROVENTIL HFA;VENTOLIN HFA) 108 (90 BASE) MCG/ACT inhaler Inhale 1-2 puffs into the lungs every 6 (six) hours as needed for wheezing or shortness of breath.  Marland Kitchen amLODipine (NORVASC) 10 MG tablet Take 10 mg by mouth daily.  Marland Kitchen atorvastatin (LIPITOR) 10 MG tablet Take 10 mg by mouth daily.  Marland Kitchen azithromycin (ZITHROMAX) 250 MG tablet Take 1 tablet (250 mg total) by mouth daily. Take first 2 tablets together, then 1 every day until finished.  . B Complex-C (B-COMPLEX WITH VITAMIN C) tablet Take 1 tablet by mouth daily.  . carvedilol (COREG) 12.5 MG tablet Take 12.5 mg by mouth 2 (two) times daily with a meal.  . cetirizine (ZYRTEC) 10 MG tablet Take 10 mg by mouth daily.  . Fluticasone-Salmeterol (ADVAIR) 250-50 MCG/DOSE AEPB Inhale 1 puff into the lungs 2 (two) times daily.  . haloperidol (HALDOL) 2  MG tablet Take 1 tablet (2 mg total) by mouth at bedtime.  . hydrochlorothiazide (HYDRODIURIL) 25 MG tablet Take 25 mg by mouth daily.  Marland Kitchen ibuprofen (ADVIL,MOTRIN) 200 MG tablet Take 400 mg by mouth every 8 (eight) hours as needed for mild pain.  Marland Kitchen levothyroxine (SYNTHROID, LEVOTHROID) 50 MCG tablet Take 50 mcg by mouth daily before breakfast.  . lisinopril (PRINIVIL,ZESTRIL) 40 MG tablet Take 40 mg by mouth every evening.  . mometasone (NASONEX) 50 MCG/ACT nasal spray  Place 2 sprays into the nose daily.  . Omega-3 Fatty Acids (FISH OIL PO) Take 1 capsule by mouth daily.  . potassium chloride SA (K-DUR,KLOR-CON) 20 MEQ tablet Take 20 mEq by mouth 2 (two) times daily.  . predniSONE (DELTASONE) 10 MG tablet Take 2 tablets (20 mg total) by mouth daily.  . [DISCONTINUED] haloperidol (HALDOL) 2 MG tablet Take 2 mg by mouth at bedtime.  . hydrOXYzine (VISTARIL) 25 MG capsule Take 1 capsule (25 mg total) by mouth at bedtime as needed for anxiety (for insomnia and anxiety).  . [DISCONTINUED] guaiFENesin (ROBITUSSIN) 100 MG/5ML SOLN Take 10 mLs by mouth every 6 (six) hours as needed for cough or to loosen phlegm.    Past Psychiatric History/Hospitalization(s): Patient endorsed history of paranoia or delusion for a long time however she handled her illness very well until 2006.  She remembered exhibiting paranoia and delusion about her family members.  She felt that people are monitoring her through television.  She became very depressed suicidal and isolated.  She took overdose on her medication and acquire psychiatric admission at [facility].  She has a history of noncompliance with medication.  After 4 months she was again readmitted due to decompensation.  She was given Abilify which worked very well for her however she stopped taking because of expensive.  Patient denies any history of physical sexual verbal and emotional abuse. Anxiety: Yes Bipolar Disorder: No Depression: Yes Mania: No Psychosis: No Schizophrenia: Yes Personality Disorder: No Hospitalization for psychiatric illness: Yes History of Electroconvulsive Shock Therapy: No Prior Suicide Attempts: Yes  Physical Exam: Constitutional:  Wt 235 lb (106.595 kg)  LMP 11/29/2013 she was reluctant to get blood pressure taken.  Recent Results (from the past 2160 hour(s))  CBC     Status: Abnormal   Collection Time    12/25/13  4:01 PM      Result Value Range   WBC 6.4  4.0 - 10.5 K/uL   RBC  3.98  3.87 - 5.11 MIL/uL   Hemoglobin 11.2 (*) 12.0 - 15.0 g/dL   HCT 33.9 (*) 36.0 - 46.0 %   MCV 85.2  78.0 - 100.0 fL   MCH 28.1  26.0 - 34.0 pg   MCHC 33.0  30.0 - 36.0 g/dL   RDW 13.7  11.5 - 15.5 %   Platelets 221  150 - 400 K/uL  COMPREHENSIVE METABOLIC PANEL     Status: Abnormal   Collection Time    12/25/13  4:01 PM      Result Value Range   Sodium 139  137 - 147 mEq/L   Potassium 3.8  3.7 - 5.3 mEq/L   Chloride 100  96 - 112 mEq/L   CO2 26  19 - 32 mEq/L   Glucose, Bld 115 (*) 70 - 99 mg/dL   BUN 13  6 - 23 mg/dL   Creatinine, Ser 1.07  0.50 - 1.10 mg/dL   Calcium 9.1  8.4 - 10.5 mg/dL   Total  Protein 7.7  6.0 - 8.3 g/dL   Albumin 3.4 (*) 3.5 - 5.2 g/dL   AST 20  0 - 37 U/L   ALT 24  0 - 35 U/L   Alkaline Phosphatase 85  39 - 117 U/L   Total Bilirubin <0.2 (*) 0.3 - 1.2 mg/dL   GFR calc non Af Amer 59 (*) >90 mL/min   GFR calc Af Amer 68 (*) >90 mL/min   Comment: (NOTE)     The eGFR has been calculated using the CKD EPI equation.     This calculation has not been validated in all clinical situations.     eGFR's persistently <90 mL/min signify possible Chronic Kidney     Disease.   General Appearance: well nourished, obese and Maintained fair eye contact  Musculoskeletal: Strength & Muscle Tone: within normal limits Gait & Station: normal Patient leans: N/A  Psychiatric: Speech (describe rate, volume, coherence, spontaneity, and abnormalities if any): Fasting but clear and coherent.  Normal tone and volume.  Thought Process (describe rate, content, abstract reasoning, and computation): Circumstantial.    Associations: Relevant, Circumstantial and Intact  Thoughts: normal and Feeling overwhelmed.  Mental Status: Orientation: oriented to person, place, time/date, situation, day of week and month of year Mood & Affect: depressed affect and anxiety Attention Span & Concentration: Fair  Medical Decision Making (Choose Three): Established Problem,  Stable/Improving (1), Review of Psycho-Social Stressors (1), Established Problem, Worsening (2), Review of Last Therapy Session (1), Review of Medication Regimen & Side Effects (2) and Review of New Medication or Change in Dosage (2)  Assessment: Axis I: Schizophrenia chronic paranoid type, anxiety disorder  Axis II: Deferred  Axis III: See medical history  Axis IV: Mild  Axis V: 60-65   Plan:  Patient is reluctant to increase her Haldol .  She wants something to help her sleep and anxiety.  I would add Vistaril 25 mg to help her sleep and anxiety symptoms.  Patient does not want to see Anderson Malta like to get another referral.  She is in the process of buying a house.  She appears more anxious tense than normal.  Reassurance given.  Recommend to call us back if she has any question or any concern.  Vision does not want to come earlier than 3 months.  Rescheduled appointment in 3 months.  I also reviewed her blood work and collateral information from her emergency visit.  Time spent 25 minutes.  More than 50% of the time spent in psychoeducation, counseling and coordination of care.  Discuss safety plan that anytime having active suicidal thoughts or homicidal thoughts then patient need to call 911 or go to the local emergency room.  , T., MD 12/28/2013

## 2013-12-28 NOTE — ED Provider Notes (Signed)
Medical screening examination/treatment/procedure(s) were conducted as a shared visit with non-physician practitioner(s) and myself.  I personally evaluated the patient during the encounter.  EKG Interpretation    Date/Time:  Tuesday December 25 2013 15:46:57 EST Ventricular Rate:  113 PR Interval:  130 QRS Duration: 72 QT Interval:  298 QTC Calculation: 408 R Axis:   0 Text Interpretation:  Sinus tachycardia Low voltage QRS Septal infarct , age undetermined Abnormal ECG ED PHYSICIAN INTERPRETATION AVAILABLE IN [facility] Confirmed by TEST, RECORD ([id number]) on 12/27/2013 10:18:32 AM           Results for orders placed during the hospital encounter of 12/25/13  CBC      Result Value Range   WBC 6.4  4.0 - 10.5 K/uL   RBC 3.98  3.87 - 5.11 MIL/uL   Hemoglobin 11.2 (*) 12.0 - 15.0 g/dL   HCT 33.9 (*) 36.0 - 46.0 %   MCV 85.2  78.0 - 100.0 fL   MCH 28.1  26.0 - 34.0 pg   MCHC 33.0  30.0 - 36.0 g/dL   RDW 13.7  11.5 - 15.5 %   Platelets 221  150 - 400 K/uL  COMPREHENSIVE METABOLIC PANEL      Result Value Range   Sodium 139  137 - 147 mEq/L   Potassium 3.8  3.7 - 5.3 mEq/L   Chloride 100  96 - 112 mEq/L   CO2 26  19 - 32 mEq/L   Glucose, Bld 115 (*) 70 - 99 mg/dL   BUN 13  6 - 23 mg/dL   Creatinine, Ser 1.07  0.50 - 1.10 mg/dL   Calcium 9.1  8.4 - 10.5 mg/dL   Total Protein 7.7  6.0 - 8.3 g/dL   Albumin 3.4 (*) 3.5 - 5.2 g/dL   AST 20  0 - 37 U/L   ALT 24  0 - 35 U/L   Alkaline Phosphatase 85  39 - 117 U/L   Total Bilirubin <0.2 (*) 0.3 - 1.2 mg/dL   GFR calc non Af Amer 59 (*) >90 mL/min   GFR calc Af Amer 68 (*) >90 mL/min   Dg Chest 2 View  12/25/2013   CLINICAL DATA:  Wheezing  EXAM: CHEST  2 VIEW  COMPARISON:  None.  FINDINGS: Cardiomediastinal silhouette is unremarkable. No acute infiltrate or pleural effusion. No pulmonary edema. Bony thorax is unremarkable. Mild degenerative changes thoracic spine.  IMPRESSION: No active cardiopulmonary disease.   Electronically  Signed   By: Lahoma Crocker M.D.   On: 12/25/2013 16:49    The patient presented with significant shortness of breath that started 2 days ago. Required hour long nebulizer initially was thinking patient was under require admission. That patient's wheezing and shortness of breath did turn around. Chest x-ray was negative for pneumonia. Patient eventually stabilized out wheezing under control feeling better was stable for discharge home with followup with primary care Dr. Patient was seen by me.  Mervin Kung, MD 12/28/13 1420

## 2013-12-30 ENCOUNTER — Encounter (HOSPITAL_COMMUNITY): Payer: Self-pay | Admitting: Emergency Medicine

## 2013-12-30 ENCOUNTER — Emergency Department (HOSPITAL_COMMUNITY): Payer: Medicare Other

## 2013-12-30 ENCOUNTER — Inpatient Hospital Stay (HOSPITAL_COMMUNITY)
Admission: EM | Admit: 2013-12-30 | Discharge: 2014-01-02 | DRG: 157 | Disposition: A | Discharge status: Home / Self Care | Payer: Medicare Other | Attending: Internal Medicine | Admitting: Internal Medicine

## 2013-12-30 DIAGNOSIS — I10 Essential (primary) hypertension: Secondary | ICD-10-CM | POA: Diagnosis not present

## 2013-12-30 DIAGNOSIS — E876 Hypokalemia: Secondary | ICD-10-CM | POA: Diagnosis present

## 2013-12-30 DIAGNOSIS — R221 Localized swelling, mass and lump, neck: Secondary | ICD-10-CM | POA: Diagnosis not present

## 2013-12-30 DIAGNOSIS — Z818 Family history of other mental and behavioral disorders: Secondary | ICD-10-CM

## 2013-12-30 DIAGNOSIS — Z8249 Family history of ischemic heart disease and other diseases of the circulatory system: Secondary | ICD-10-CM | POA: Diagnosis not present

## 2013-12-30 DIAGNOSIS — D638 Anemia in other chronic diseases classified elsewhere: Secondary | ICD-10-CM | POA: Diagnosis present

## 2013-12-30 DIAGNOSIS — Z87891 Personal history of nicotine dependence: Secondary | ICD-10-CM | POA: Diagnosis not present

## 2013-12-30 DIAGNOSIS — R0609 Other forms of dyspnea: Secondary | ICD-10-CM | POA: Diagnosis not present

## 2013-12-30 DIAGNOSIS — E1169 Type 2 diabetes mellitus with other specified complication: Secondary | ICD-10-CM | POA: Diagnosis present

## 2013-12-30 DIAGNOSIS — J09X2 Influenza due to identified novel influenza A virus with other respiratory manifestations: Secondary | ICD-10-CM

## 2013-12-30 DIAGNOSIS — J45909 Unspecified asthma, uncomplicated: Secondary | ICD-10-CM | POA: Diagnosis present

## 2013-12-30 DIAGNOSIS — E785 Hyperlipidemia, unspecified: Secondary | ICD-10-CM | POA: Diagnosis present

## 2013-12-30 DIAGNOSIS — R7302 Impaired glucose tolerance (oral): Secondary | ICD-10-CM

## 2013-12-30 DIAGNOSIS — E669 Obesity, unspecified: Secondary | ICD-10-CM | POA: Diagnosis present

## 2013-12-30 DIAGNOSIS — E038 Other specified hypothyroidism: Secondary | ICD-10-CM | POA: Diagnosis present

## 2013-12-30 DIAGNOSIS — T380X5A Adverse effect of glucocorticoids and synthetic analogues, initial encounter: Secondary | ICD-10-CM | POA: Diagnosis present

## 2013-12-30 DIAGNOSIS — A419 Sepsis, unspecified organism: Secondary | ICD-10-CM

## 2013-12-30 DIAGNOSIS — R079 Chest pain, unspecified: Secondary | ICD-10-CM

## 2013-12-30 DIAGNOSIS — R7309 Other abnormal glucose: Secondary | ICD-10-CM | POA: Diagnosis present

## 2013-12-30 DIAGNOSIS — R220 Localized swelling, mass and lump, head: Secondary | ICD-10-CM | POA: Diagnosis not present

## 2013-12-30 DIAGNOSIS — T783XXA Angioneurotic edema, initial encounter: Secondary | ICD-10-CM

## 2013-12-30 DIAGNOSIS — E039 Hypothyroidism, unspecified: Secondary | ICD-10-CM | POA: Diagnosis present

## 2013-12-30 DIAGNOSIS — K122 Cellulitis and abscess of mouth: Principal | ICD-10-CM | POA: Diagnosis present

## 2013-12-30 DIAGNOSIS — R0602 Shortness of breath: Secondary | ICD-10-CM | POA: Diagnosis not present

## 2013-12-30 DIAGNOSIS — F200 Paranoid schizophrenia: Secondary | ICD-10-CM | POA: Diagnosis not present

## 2013-12-30 LAB — CBC WITH DIFFERENTIAL/PLATELET
BASOS ABS: 0 10*3/uL (ref 0.0–0.1)
BASOS PCT: 0 % (ref 0–1)
EOS PCT: 0 % (ref 0–5)
Eosinophils Absolute: 0 10*3/uL (ref 0.0–0.7)
HEMATOCRIT: 34.3 % — AB (ref 36.0–46.0)
Hemoglobin: 11.2 g/dL — ABNORMAL LOW (ref 12.0–15.0)
LYMPHS PCT: 26 % (ref 12–46)
Lymphs Abs: 2.1 10*3/uL (ref 0.7–4.0)
MCH: 27.3 pg (ref 26.0–34.0)
MCHC: 32.7 g/dL (ref 30.0–36.0)
MCV: 83.7 fL (ref 78.0–100.0)
Monocytes Absolute: 0.9 10*3/uL (ref 0.1–1.0)
Monocytes Relative: 11 % (ref 3–12)
Neutro Abs: 4.8 10*3/uL (ref 1.7–7.7)
Neutrophils Relative %: 62 % (ref 43–77)
Platelets: 277 10*3/uL (ref 150–400)
RBC: 4.1 MIL/uL (ref 3.87–5.11)
RDW: 13.6 % (ref 11.5–15.5)
WBC: 7.8 10*3/uL (ref 4.0–10.5)

## 2013-12-30 LAB — BASIC METABOLIC PANEL
BUN: 14 mg/dL (ref 6–23)
CO2: 28 meq/L (ref 19–32)
Calcium: 9.2 mg/dL (ref 8.4–10.5)
Chloride: 98 mEq/L (ref 96–112)
Creatinine, Ser: 1.09 mg/dL (ref 0.50–1.10)
GFR calc Af Amer: 66 mL/min — ABNORMAL LOW (ref >90)
GFR calc non Af Amer: 57 mL/min — ABNORMAL LOW (ref >90)
Glucose, Bld: 87 mg/dL (ref 70–99)
Potassium: 3.3 mEq/L — ABNORMAL LOW (ref 3.7–5.3)
Sodium: 140 mEq/L (ref 137–147)

## 2013-12-30 LAB — MRSA PCR SCREENING: MRSA BY PCR: NEGATIVE

## 2013-12-30 LAB — PHOSPHORUS: PHOSPHORUS: 3 mg/dL (ref 2.3–4.6)

## 2013-12-30 LAB — POCT I-STAT TROPONIN I: TROPONIN I, POC: 0.01 ng/mL (ref 0.00–0.08)

## 2013-12-30 LAB — LACTIC ACID, PLASMA: LACTIC ACID, VENOUS: 1.4 mmol/L (ref 0.5–2.2)

## 2013-12-30 LAB — TROPONIN I: Troponin I: <0.3 ng/mL (ref <0.30)

## 2013-12-30 LAB — PROCALCITONIN: Procalcitonin: <0.1 ng/mL

## 2013-12-30 LAB — MAGNESIUM: MAGNESIUM: 2.1 mg/dL (ref 1.5–2.5)

## 2013-12-30 LAB — PRO B NATRIURETIC PEPTIDE: PRO B NATRI PEPTIDE: 63.5 pg/mL (ref 0–125)

## 2013-12-30 MED ORDER — DIPHENHYDRAMINE HCL 50 MG/ML IJ SOLN
25.0000 mg | Freq: Four times a day (QID) | INTRAMUSCULAR | Status: DC
  Administered 2013-12-30 – 2013-12-31 (×2): 25 mg via INTRAVENOUS
  Filled 2013-12-30 (×2): qty 0.5
  Filled 2013-12-30: qty 1
  Filled 2013-12-30 (×2): qty 0.5

## 2013-12-30 MED ORDER — LACTINEX PO CHEW
1.0000 | CHEWABLE_TABLET | Freq: Three times a day (TID) | ORAL | Status: DC
Start: 2013-12-30 — End: 2013-12-31
  Administered 2013-12-30 – 2013-12-31 (×2): 1 via ORAL
  Filled 2013-12-30 (×6): qty 1

## 2013-12-30 MED ORDER — DEXAMETHASONE SODIUM PHOSPHATE 10 MG/ML IJ SOLN
10.0000 mg | Freq: Three times a day (TID) | INTRAMUSCULAR | Status: DC
  Administered 2013-12-30: 10 mg via INTRAVENOUS
  Filled 2013-12-30: qty 1

## 2013-12-30 MED ORDER — IOHEXOL 300 MG/ML  SOLN
100.0000 mL | Freq: Once | INTRAMUSCULAR | Status: AC | PRN
  Administered 2013-12-30: 100 mL via INTRAVENOUS

## 2013-12-30 MED ORDER — HEPARIN SODIUM (PORCINE) 5000 UNIT/ML IJ SOLN
5000.0000 [IU] | Freq: Three times a day (TID) | INTRAMUSCULAR | Status: DC
  Administered 2013-12-31 – 2014-01-01 (×7): 5000 [IU] via SUBCUTANEOUS
  Filled 2013-12-30 (×11): qty 1

## 2013-12-30 MED ORDER — FAMOTIDINE IN NACL 20-0.9 MG/50ML-% IV SOLN
20.0000 mg | Freq: Two times a day (BID) | INTRAVENOUS | Status: DC
  Administered 2013-12-30 – 2014-01-01 (×4): 20 mg via INTRAVENOUS
  Filled 2013-12-30 (×7): qty 50

## 2013-12-30 MED ORDER — CLINDAMYCIN PHOSPHATE 600 MG/50ML IV SOLN
600.0000 mg | Freq: Once | INTRAVENOUS | Status: AC
  Administered 2013-12-30: 600 mg via INTRAVENOUS
  Filled 2013-12-30: qty 50

## 2013-12-30 MED ORDER — ASPIRIN 300 MG RE SUPP: 300.0000 mg | RECTAL | Status: AC

## 2013-12-30 MED ORDER — METHYLPREDNISOLONE SODIUM SUCC 125 MG IJ SOLR
125.0000 mg | Freq: Once | INTRAMUSCULAR | Status: AC
  Administered 2013-12-30: 125 mg via INTRAVENOUS
  Filled 2013-12-30: qty 2

## 2013-12-30 MED ORDER — DIPHENHYDRAMINE HCL 12.5 MG/5ML PO ELIX
12.5000 mg | ORAL_SOLUTION | Freq: Four times a day (QID) | ORAL | Status: DC | PRN
  Administered 2013-12-30: 12.5 mg via ORAL
  Filled 2013-12-30: qty 5

## 2013-12-30 MED ORDER — CLINDAMYCIN PHOSPHATE 600 MG/50ML IV SOLN
600.0000 mg | Freq: Four times a day (QID) | INTRAVENOUS | Status: DC
  Administered 2013-12-30 – 2014-01-01 (×7): 600 mg via INTRAVENOUS
  Filled 2013-12-30 (×11): qty 50

## 2013-12-30 MED ORDER — DOXYCYCLINE HYCLATE 100 MG IV SOLR
100.0000 mg | Freq: Two times a day (BID) | INTRAVENOUS | Status: DC
  Administered 2013-12-30 – 2014-01-01 (×4): 100 mg via INTRAVENOUS
  Filled 2013-12-30 (×7): qty 100

## 2013-12-30 MED ORDER — DIPHENHYDRAMINE HCL 50 MG/ML IJ SOLN: 25.0000 mg | Freq: Once | INTRAMUSCULAR | Status: AC

## 2013-12-30 MED ORDER — SODIUM CHLORIDE 0.9 % IV SOLN
INTRAVENOUS | Status: DC
  Administered 2013-12-30: 12:00:00 via INTRAVENOUS

## 2013-12-30 MED ORDER — METHYLPREDNISOLONE SODIUM SUCC 125 MG IJ SOLR
80.0000 mg | Freq: Four times a day (QID) | INTRAMUSCULAR | Status: DC
  Administered 2013-12-30 – 2013-12-31 (×3): 80 mg via INTRAVENOUS
  Filled 2013-12-30 (×7): qty 1.28

## 2013-12-30 MED ORDER — SODIUM CHLORIDE 0.9 % IV SOLN
INTRAVENOUS | Status: DC
  Administered 2013-12-30 – 2013-12-31 (×2): via INTRAVENOUS
  Administered 2014-01-01: 1000 mL via INTRAVENOUS

## 2013-12-30 MED ORDER — IPRATROPIUM-ALBUTEROL 0.5-2.5 (3) MG/3ML IN SOLN
3.0000 mL | RESPIRATORY_TRACT | Status: DC
  Administered 2013-12-30 – 2014-01-02 (×14): 3 mL via RESPIRATORY_TRACT
  Filled 2013-12-30 (×15): qty 3

## 2013-12-30 MED ORDER — FAMOTIDINE IN NACL 20-0.9 MG/50ML-% IV SOLN: 20.0000 mg | Freq: Once | INTRAVENOUS | Status: DC

## 2013-12-30 MED ORDER — SODIUM CHLORIDE 0.9 % IV SOLN: 250.0000 mL | INTRAVENOUS | Status: DC | PRN

## 2013-12-30 MED ORDER — HYDROMORPHONE HCL PF 1 MG/ML IJ SOLN
1.0000 mg | Freq: Once | INTRAMUSCULAR | Status: AC
  Administered 2013-12-30: 1 mg via INTRAVENOUS
  Filled 2013-12-30: qty 1

## 2013-12-30 MED ORDER — ASPIRIN 81 MG PO CHEW
324.0000 mg | CHEWABLE_TABLET | ORAL | Status: AC
  Administered 2013-12-30: 324 mg via ORAL
  Filled 2013-12-30: qty 4

## 2013-12-30 NOTE — ED Provider Notes (Signed)
CSN: [id number]     Arrival date & time 12/30/13  3086 History   First MD Initiated Contact with Patient 12/30/13 1034     Chief Complaint  Patient presents with  . Shortness of Breath  . Facial Swelling   (Consider location/radiation/quality/duration/timing/severity/associated sxs/prior Treatment) Patient is a 53 y.o. female presenting with shortness of breath. The history is provided by the patient.  Shortness of Breath  patient here complaining of left-sided neck pain, swelling with increased tenderness and firmness to the touch. Also notes increased trouble breathing secondary to her asthma. Seen here recently for asthma. No fever or chills. Symptoms have been increasingly worse. Denies any teeth pain. No intraoral drainage noted. No prior history of same. No treatment used prior to arrival. Nothing makes her symptoms better worse  Past Medical History  Diagnosis Date  . Asthma   . Chicken pox   . Allergy   . Hypertension   . Hyperlipidemia   . Thyroid disease    Past Surgical History  Procedure Laterality Date  . Sinus irrigation    . Tonsillectomy     Family History  Problem Relation Age of Onset  . Mental illness Mother   . Hypertension Mother   . Schizophrenia Mother   . Cancer Father     LUNG  . Cancer Sister     BREAST  . Mental illness Brother   . Hypertension Brother   . Schizophrenia Brother   . Heart disease Maternal Grandmother   . Schizophrenia Brother    History  Substance Use Topics  . Smoking status: Former Research scientist (life sciences)  . Smokeless tobacco: Not on file  . Alcohol Use: No   OB History   Grav Para Term Preterm Abortions TAB SAB Ect Mult Living                 Review of Systems  Respiratory: Positive for shortness of breath.   All other systems reviewed and are negative.    Allergies  Review of patient's allergies indicates no known allergies.  Home Medications   Current Outpatient Rx  Name  Route  Sig  Dispense  Refill  . albuterol  (PROVENTIL HFA;VENTOLIN HFA) 108 (90 BASE) MCG/ACT inhaler   Inhalation   Inhale 1-2 puffs into the lungs every 6 (six) hours as needed for wheezing or shortness of breath.         Marland Kitchen amLODipine (NORVASC) 10 MG tablet   Oral   Take 10 mg by mouth daily.         Marland Kitchen atorvastatin (LIPITOR) 10 MG tablet   Oral   Take 10 mg by mouth daily.         Marland Kitchen azithromycin (ZITHROMAX) 250 MG tablet   Oral   Take 1 tablet (250 mg total) by mouth daily. Take first 2 tablets together, then 1 every day until finished.   6 tablet   0   . B Complex-C (B-COMPLEX WITH VITAMIN C) tablet   Oral   Take 1 tablet by mouth daily.         . carvedilol (COREG) 12.5 MG tablet   Oral   Take 12.5 mg by mouth 2 (two) times daily with a meal.         . cetirizine (ZYRTEC) 10 MG tablet   Oral   Take 10 mg by mouth daily.         . Fluticasone-Salmeterol (ADVAIR) 250-50 MCG/DOSE AEPB   Inhalation   Inhale 1 puff into the  lungs 2 (two) times daily.         . haloperidol (HALDOL) 2 MG tablet   Oral   Take 1 tablet (2 mg total) by mouth at bedtime.   90 tablet   0   . hydrochlorothiazide (HYDRODIURIL) 25 MG tablet   Oral   Take 25 mg by mouth daily.         Marland Kitchen ibuprofen (ADVIL,MOTRIN) 200 MG tablet   Oral   Take 400 mg by mouth every 8 (eight) hours as needed for mild pain.         Marland Kitchen levothyroxine (SYNTHROID, LEVOTHROID) 50 MCG tablet   Oral   Take 50 mcg by mouth daily before breakfast.         . lisinopril (PRINIVIL,ZESTRIL) 40 MG tablet   Oral   Take 40 mg by mouth every evening.         . mometasone (NASONEX) 50 MCG/ACT nasal spray   Nasal   Place 2 sprays into the nose daily.         . Omega-3 Fatty Acids (FISH OIL PO)   Oral   Take 1 capsule by mouth daily.         . potassium chloride SA (K-DUR,KLOR-CON) 20 MEQ tablet   Oral   Take 20 mEq by mouth 2 (two) times daily.         . predniSONE (DELTASONE) 10 MG tablet   Oral   Take 2 tablets (20 mg total) by  mouth daily.   21 tablet   0     Prednisone dose pack directions:   6 tabs on day ...   . tetrahydrozoline (VISINE) 0.05 % ophthalmic solution   Both Eyes   Place 2 drops into both eyes.         . hydrOXYzine (VISTARIL) 25 MG capsule   Oral   Take 1 capsule (25 mg total) by mouth at bedtime as needed for anxiety (for insomnia and anxiety).   90 capsule   0    BP 130/90  Pulse 87  Temp(Src) 98.4 F (36.9 C) (Oral)  SpO2 94%  LMP 12/30/2013 Physical Exam  Nursing note and vitals reviewed. Constitutional: She is oriented to person, place, and time. She appears well-developed and well-nourished.  Non-toxic appearance. No distress.  HENT:  Head: Normocephalic and atraumatic.  Mouth/Throat: Mucous membranes are normal. No oropharyngeal exudate, posterior oropharyngeal edema, posterior oropharyngeal erythema or tonsillar abscesses.  Eyes: Conjunctivae, EOM and lids are normal. Pupils are equal, round, and reactive to light.  Neck: Normal range of motion. Neck supple. No tracheal deviation present. No mass present.    Cardiovascular: Normal rate, regular rhythm and normal heart sounds.  Exam reveals no gallop.   No murmur heard. Pulmonary/Chest: Effort normal and breath sounds normal. No stridor. No respiratory distress. She has no decreased breath sounds. She has no wheezes. She has no rhonchi. She has no rales.  Abdominal: Soft. Normal appearance and bowel sounds are normal. She exhibits no distension. There is no tenderness. There is no rebound and no CVA tenderness.  Musculoskeletal: Normal range of motion. She exhibits no edema and no tenderness.  Neurological: She is alert and oriented to person, place, and time. She has normal strength. No cranial nerve deficit or sensory deficit. GCS eye subscore is 4. GCS verbal subscore is 5. GCS motor subscore is 6.  Skin: Skin is warm and dry. No abrasion and no rash noted.  Psychiatric: She has a normal mood and  affect. Her speech is  normal and behavior is normal.    ED Course  Procedures (including critical care time) Labs Review Labs Reviewed  CBC WITH DIFFERENTIAL  BASIC METABOLIC PANEL   Imaging Review No results found.  EKG Interpretation   None       MDM  No diagnosis found. Patient given IV fluids and pain meds here. During her stay here, the patient's edema has extended from the left submandibular space to the right submandibular space. She has no evidence of stridor at this time. Patient rechecked multiple times during her stay in the department and is maintaining her airway. Spoke with Dr. Simeon Craft from ENT and is here to see the patient. Patient has been started on clindamycin and Solu-Medrol for possible infection versus angioedema. Spoke with the critical care physician and he'll come to admit the patient when he is able. Patient also received a dose of Benadryl as well as Pepcid per the recommendations of critical care.   CRITICAL CARE Performed by: [name] Total critical care time: 45 Critical care time was exclusive of separately billable procedures and treating other patients. Critical care was necessary to treat or prevent imminent or life-threatening deterioration. Critical care was time spent personally by me on the following activities: development of treatment plan with patient and/or surrogate as well as nursing, discussions with consultants, evaluation of patient's response to treatment, examination of patient, obtaining history from patient or surrogate, ordering and performing treatments and interventions, ordering and review of laboratory studies, ordering and review of radiographic studies, pulse oximetry and re-evaluation of patient's condition.     [name], MD 12/30/13 [phone]

## 2013-12-30 NOTE — H&P (Addendum)
PULMONARY  / CRITICAL CARE MEDICINE  Name: Maria Franklin MRN: [id number] DOB: Nov 05, 1961 PCP Nicki Reaper, NP Psychiatry: Dr Margie Billet   ADMISSION DATE:  12/30/2013  LOS 0 days   CONSULTATION DATE:  12/30/13  REFERRING MD :  Dr Einar Grad of ER PRIMARY SERVICE: PCCM  CHIEF COMPLAINT:  Submandibular swellng  BRIEF PATIENT DESCRIPTION: Possible Ludwig Angina  SIGNIFICANT EVENTS / STUDIES:  12/30/2013 - Admit possible ludwig   LINES / TUBES: none  CULTURES: Blood resp virus  ANTIBIOTICS: Anti-infectives   Start     Dose/Rate Route Frequency Ordered Stop   12/30/13 2000  clindamycin (CLEOCIN) IVPB 600 mg     600 mg 100 mL/hr over 30 Minutes Intravenous Every 6 hours 12/30/13 1336     12/30/13 1430  doxycycline (VIBRAMYCIN) 100 mg in dextrose 5 % 250 mL IVPB     100 mg 125 mL/hr over 120 Minutes Intravenous Every 12 hours 12/30/13 1336     12/30/13 1330  clindamycin (CLEOCIN) IVPB 600 mg     600 mg 100 mL/hr over 30 Minutes Intravenous  Once 12/30/13 1316 12/30/13 1400       HISTORY OF PRESENT ILLNESS:    53 year old obese AA female with BP (on ace inhibitor,norvac, and coreg), hypothryoid, asthma on advair. And also paranoid schizophrenic (sees Dr Florentina Jenny). Went to [facility] ER 12/25/13 with dyspnea and reportedly diagnosed as AE -asthma and discharged on prednisone NNPS. On 12/29/13 noticed acute onset of left sided submandibular swelling that on moring of 12/30/13 had progressed ot larger size and also on right. In ER, had further progression of swelling. Some drooling of saliva reported by ER doc but denied by ENT and patientuncertain. Clearly no dysphagia or stridor. CT neck 12/30/2013 inflammation nos in floor of mouth. ENT seen by Dr Emeline Darling and found airway to be patent and has recommended medical mgmt. DDx of infection v ace inhibitor angioedema per ENT. PCCM admiting 12/30/2013. Note denies dental work recently    PAST MEDICAL HISTORY :  Past Medical History   Diagnosis Date  . Asthma   . Chicken pox   . Allergy   . Hypertension   . Hyperlipidemia   . Thyroid disease      Family History  Problem Relation Age of Onset  . Mental illness Mother   . Hypertension Mother   . Schizophrenia Mother   . Cancer Father     LUNG  . Cancer Sister     BREAST  . Mental illness Brother   . Hypertension Brother   . Schizophrenia Brother   . Heart disease Maternal Grandmother   . Schizophrenia Brother      History   Social History  . Marital Status: Married    Spouse Name: N/A    Number of Children: N/A  . Years of Education: N/A   Occupational History  . Not on file.   Social History Main Topics  . Smoking status: Former Games developer  . Smokeless tobacco: Not on file  . Alcohol Use: No  . Drug Use: No  . Sexual Activity: Not on file   Other Topics Concern  . Not on file   Social History Narrative   3 CHILDREN   RECENTLY MOVED TO [place]   SEEKING EMPLOYMENT     No Known Allergies    (Not in an outpatient encounter)     REVIEW OF SYSTEMS:  Per 11 point ROS negative other than in HPI  SUBJECTIVE:   VITAL  SIGNS: Filed Vitals:   12/30/13 1004 12/30/13 1224 12/30/13 1225  BP: 130/90    Pulse: 87 86   Temp: 98.4 F (36.9 C)    TempSrc: Oral    Resp:  18   SpO2: 94% 94% 98%   INTAKE / OUTPUT:       PHYSICAL EXAMINATION: General:  Obese female. No distress Neuro:  AxOx3. Speech normal. Movesl all 4s HEENT:  No drooling of saliva. Oral cavity looks normal. Large indurated, hard, ? One crepitus bilateral submandibular swelling + Cardiovascular:  RRR. No murmurs Lungs:  Some wheezing anteriorly and postterioly. No distress. No stridor Abdomen:  Soft . No mass. No organomegaly Musculoskeletal:  No cyanosis. No clubbing. No edema Skin:  Intact  LABS: PULMONARY No results found for this basename: PHART, PCO2, PCO2ART, PO2, PO2ART, HCO3, TCO2, O2SAT,  in the last 168 hours  CBC  Recent Labs Lab  12/25/13 1601 12/30/13 1056  HGB 11.2* 11.2*  HCT 33.9* 34.3*  WBC 6.4 7.8  PLT 221 277    COAGULATION No results found for this basename: INR,  in the last 168 hours  CARDIAC  No results found for this basename: TROPONINI,  in the last 168 hours No results found for this basename: PROBNP,  in the last 168 hours   CHEMISTRY  Recent Labs Lab 12/25/13 1601 12/30/13 1056  NA 139 140  K 3.8 3.3*  CL 100 98  CO2 26 28  GLUCOSE 115* 87  BUN 13 14  CREATININE 1.07 1.09  CALCIUM 9.1 9.2   The CrCl is unknown because both a height and weight (above a minimum accepted value) are required for this calculation.   LIVER  Recent Labs Lab 12/25/13 1601  AST 20  ALT 24  ALKPHOS 85  BILITOT <0.2*  PROT 7.7  ALBUMIN 3.4*     INFECTIOUS No results found for this basename: LATICACIDVEN, PROCALCITON,  in the last 168 hours   ENDOCRINE CBG (last 3)  No results found for this basename: GLUCAP,  in the last 72 hours       IMAGING x48h  Dg Chest 2 View  12/30/2013   CLINICAL DATA:  Short of breath  EXAM: CHEST  2 VIEW  COMPARISON:  12/25/2013  FINDINGS: Normal heart size.  Clear lungs.  IMPRESSION: Negative.   Electronically Signed   By: Maryclare Bean M.D.   On: 12/30/2013 11:07   Ct Soft Tissue Neck W Contrast  12/30/2013   ADDENDUM REPORT: 12/30/2013 13:09  ADDENDUM: The patient is on an ACE inhibitor and therefore it is possible that inflammatory process represent noninfectious angio neurotic edema. Infectious process still not excluded in proper clinical setting. If the patient has progressive symptoms, close followup imaging may be considered. Results discussed with Dr. Zenia Resides by phone 12/30/2013 1 p.m.   Electronically Signed   By: Chauncey Cruel M.D.   On: 12/30/2013 13:09   12/30/2013   CLINICAL DATA:  Redness under chin.  Jaw swelling.  EXAM: CT NECK WITH CONTRAST  TECHNIQUE: Multidetector CT imaging of the neck was performed using the standard protocol following the  bolus administration of intravenous contrast.  CONTRAST:  157mL OMNIPAQUE IOHEXOL 300 MG/ML  SOLN  COMPARISON:  None.  FINDINGS: Inflammation subcutaneous region inferior to the floor of the mouth. Inflammation of the left parapharyngeal region extending to the retropharyngeal region. Mild edema/inflammation extends inferiorly superficial to the left strap muscles and superiorly superficial to the left masseter muscle/ platysma muscle.  Source of  this inflammatory process is indeterminate. It is possible this is related to inflammation of the left palatine tonsil however the tonsils does not appear significantly enlarged. Presently no well-defined drainable abscess.  No dental source identified as cause of inflammatory process. The submandibular glands and parotid glands appear fairly symmetric without duct dilation or stone identified.  Epiglottis does not appear inflamed. Asymmetric appearance of the piriform sinus.  6 mm right thyroid lesion incompletely assessed.  Lung apices clear.  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 Almost complete opacification right maxillary sinus. Polypoid opacification nasal turbinates more notable on the right. Prominent mucosal thickening sphenoid sinus air cells with left sphenoid sinus air cell dominant. Opacification right ethmoid sinus air cell and moderate mucosal thickening left ethmoid sinus air cell with appearance to prior partial ethmoidectomy. Sinus opacification slightly dense and may represent inspissated material. Fungal disease not excluded.  Visualized mastoid air cells and middle ear cavities are clear.  Cervical spondylotic changes. Visualized orbital intracranial structures unremarkable.  IMPRESSION: Inflammation subcutaneous region inferior to the floor of the mouth. Inflammation of the left parapharyngeal region extending to the retropharyngeal region. Mild edema / inflammation extends inferiorly superficial to the left strap muscles  and superiorly superficial to the left masseter muscle/ platysma muscle.  Source of this inflammatory process is indeterminate. It is possible this is related to inflammation of the left palatine tonsil however the tonsils does not appear significantly enlarged. Presently no well-defined drainable abscess.  Prominent paranasal sinus disease as noted above  6 mm right thyroid lesion incompletely assessed  Electronically Signed: By: Chauncey Cruel M.D. On: 12/30/2013 13:00       ASSESSMENT / PLAN:  PULMONARY A:  Baseline "asthma" NOSandon ace inhibiors Currently At risk for Stridory andUpper Airway obstruction due to [employer] Possible but ulikely angioedema  P:   DC ace inhibitor -> BD -> needs pulm fu to assess for asthma with exhaled NO asssessment Monitor in SDU for UAO and tracheostomy need Steroids, h2 blockade  CARDIOVASCULAR A: Intact P:  Check bnp and enzumes  RENAL A:  Intact P:   monitor  GASTROINTESTINAL A:  AT risk for resp compromise P:   Clear lquid dietand meds only   HEMATOLOGIC A:  Anemia of chronic disease P:  DVT proph - PRBC for hgb </= 6.9gm%    - exceptions are   -  if ACS susepcted/confirmed then transfuse for hgb </= 8.0gm%,  or    -- active bleeding with hemodynamic instability, then transfuse regardless of hemoglobin value   At at all times try to transfuse 1 unit prbc as possible with exception of active hemorrhage    INFECTIOUS A:  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 Sepsis P:   Clinda per ENT Doxy per ENT Check Resp virus PCR Check PCt and lacate  ENDOCRINE A:  AT risk for hyperglycemia due to steroids   P:   ICU hyperglycemia protcol  NEUROLOGIC A:  Hx of schizophrenia . Currenty pleasant P:   monitor  TODAY'S SUMMARY: admit SDU. Monitor for airway compromise      The patient is critically ill with multiple organ systems failure and requires high complexity decision making for assessment and support, frequent evaluation and  titration of therapies, application of advanced monitoring technologies and extensive interpretation of multiple databases.   Critical Care Time devoted to patient care services described in this note is  35  Minutes.   Dr. Maricela Curet, M.D Fellow [facility] Pulmonary and Critical  Care Pulmonary and [place] [facility] Pager: [phone] (use only between 19:00h - 7:00h). Otherwise, page [phone]  12/30/2013 2:35 PM

## 2013-12-30 NOTE — ED Notes (Signed)
She tells me she was seen at [facility] a couple of days ago and was found to have wheezing and was prescribed Azithromycin. She now c/o painful swelling of angle of jaw area.  She is in no distress.

## 2013-12-30 NOTE — ED Notes (Signed)
Dr. Lynford Citizen is seeing her as I write this.  She had been seen and examined by Dr. Simeon Craft ~45 min. Ago, at which time he performed a brief flexible laryngoscopic exam.  She tells me it feels that the swelling under her jaw (her entire jaw) is decreasing.

## 2013-12-30 NOTE — Consult Note (Addendum)
Maria Franklin, Maria Franklin [id number] 1961-08-11 Maria Jacobsen, MD  Reason for Consult: left facial/submandibular swelling  HPI: 52yo on an ACE inhibitor with several days of worsening left submandibular swelling and pain. CT neck showed some left submandibular and parapharyngeal edema without abscess and grossly normal parotid and submandibular glands with no sialoadenitis, with some ethmoid and R>L maxillary sinus infection. ENT consulted for submandibular swelling.  Allergies: No Known Allergies  ROS: left neck pain and throat pain and swelling, otherwise negative x 12 systems except per HPI.  PMH:  Past Medical History  Diagnosis Date  . Asthma   . Chicken pox   . Allergy   . Hypertension   . Hyperlipidemia   . Thyroid disease     FH:  Family History  Problem Relation Age of Onset  . Mental illness Mother   . Hypertension Mother   . Schizophrenia Mother   . Cancer Father     LUNG  . Cancer Sister     BREAST  . Mental illness Brother   . Hypertension Brother   . Schizophrenia Brother   . Heart disease Maternal Grandmother   . Schizophrenia Brother     SH:  History   Social History  . Marital Status: Married    Spouse Name: N/A    Number of Children: N/A  . Years of Education: N/A   Occupational History  . Not on file.   Social History Main Topics  . Smoking status: Former Research scientist (life sciences)  . Smokeless tobacco: Not on file  . Alcohol Use: No  . Drug Use: No  . Sexual Activity: Not on file   Other Topics Concern  . Not on file   Social History Narrative   3 CHILDREN   RECENTLY MOVED TO [place]   SEEKING EMPLOYMENT    PSH:  Past Surgical History  Procedure Laterality Date  . Sinus irrigation    . Tonsillectomy      Physical  Exam: CN 2-12 grossly intact and symmetric. EAC/TMs normal BL. Oral cavity, lips, gums, ororpharynx normal with no masses or lesions. Skin warm and dry. Nasal cavity with some middle meatal purulence. External nose and ears without  masses or lesions. EOMI, PERRLA. Neck with some left submandibular edema and erythema with tenderness to palpation.  Procedure Note: 31575 Informed verbal consent was obtained after explaining the risks (including bleeding and infection), benefits and alternatives of the procedure. Verbal timeout was performed prior to the procedure. The nose was topicalized with topical lidocaine/oxymetazoline. The 42mm flexible scope was advanced through the bilateral nasal cavities. The septum and turbinates appeared normal. The middle meatus showed some edema and purulence R>L. The eustachian tube, choana, and adenoids were normal in appearance. The epiglottis, hypopharynx, arytenoids, false vocal folds, and true vocal folds appeared normal with no edema and the glottic airway and subglottic airway are widely patent with normal adduction and abduction bilaterally. The visualized portion of the subglottis appeared normal. The patient tolerated the procedure with no immediate complications.  A/P: left submandibular swelling and pain of unknown etiology. Possibly dental vs. Pharyngitis vs. Sialoadenitis vs. Cutaneous source, vs. Angioedema. Would recommend admission to hospitalist or medicine service for clinical monitoring, and treat with IV doxycycline and clindamycin, and decadron Q8 hours, along with benadryl and Pepcid scheduled to treat empirically for angioedema. Would stop her ACE inhibitor. Primary team can monitor for clinical improvement and once clinically improved can go home on oral doxycycline or clindamycin x 14 days with a prednisone taper. Airway is  widely patent. The antibiotics and steroids will help with the sinusitis as well, she may need to consider revision endoscopic sinus surgery at some point in the future.   Ruby Cola 12/30/2013 2:14 PM

## 2013-12-30 NOTE — ED Notes (Signed)
She is trepidatious about being admitted. I assured her that d/t her level of swelling (which has visibly increased since her arrival in E.D.) we would not only admit her; but to I.C.U.

## 2013-12-30 NOTE — ED Notes (Signed)
Pt states that she was seen [facility] for [facility].  Was told that her asthma was acting up.  Yesterday, she woke up and her jaw was swollen.  States that she thinks she has an infection.  Pt is in NAD and is answering questions with full sentences.

## 2013-12-30 NOTE — ED Notes (Signed)
Attempted to call report at this time 

## 2013-12-31 DIAGNOSIS — E669 Obesity, unspecified: Secondary | ICD-10-CM

## 2013-12-31 DIAGNOSIS — E785 Hyperlipidemia, unspecified: Secondary | ICD-10-CM

## 2013-12-31 DIAGNOSIS — I10 Essential (primary) hypertension: Secondary | ICD-10-CM

## 2013-12-31 DIAGNOSIS — E876 Hypokalemia: Secondary | ICD-10-CM

## 2013-12-31 LAB — CBC
HCT: 33.9 % — ABNORMAL LOW (ref 36.0–46.0)
Hemoglobin: 11 g/dL — ABNORMAL LOW (ref 12.0–15.0)
MCH: 27.3 pg (ref 26.0–34.0)
MCHC: 32.4 g/dL (ref 30.0–36.0)
MCV: 84.1 fL (ref 78.0–100.0)
Platelets: 264 10*3/uL (ref 150–400)
RBC: 4.03 MIL/uL (ref 3.87–5.11)
RDW: 13.4 % (ref 11.5–15.5)
WBC: 6.2 10*3/uL (ref 4.0–10.5)

## 2013-12-31 LAB — RESPIRATORY VIRUS PANEL
Adenovirus: NOT DETECTED
Influenza A H1: NOT DETECTED
Influenza A H3: DETECTED — AB
Influenza A: DETECTED — AB
Influenza B: NOT DETECTED
Metapneumovirus: NOT DETECTED
PARAINFLUENZA 2 A: NOT DETECTED
PARAINFLUENZA 3 A: NOT DETECTED
Parainfluenza 1: NOT DETECTED
RESPIRATORY SYNCYTIAL VIRUS A: NOT DETECTED
RHINOVIRUS: NOT DETECTED
Respiratory Syncytial Virus B: NOT DETECTED

## 2013-12-31 LAB — BASIC METABOLIC PANEL
BUN: 13 mg/dL (ref 6–23)
CHLORIDE: 97 meq/L (ref 96–112)
CO2: 26 meq/L (ref 19–32)
Calcium: 8.3 mg/dL — ABNORMAL LOW (ref 8.4–10.5)
Creatinine, Ser: 1.07 mg/dL (ref 0.50–1.10)
GFR calc Af Amer: 68 mL/min — ABNORMAL LOW (ref >90)
GFR calc non Af Amer: 59 mL/min — ABNORMAL LOW (ref >90)
Glucose, Bld: 207 mg/dL — ABNORMAL HIGH (ref 70–99)
Potassium: 3.5 mEq/L — ABNORMAL LOW (ref 3.7–5.3)
Sodium: 138 mEq/L (ref 137–147)

## 2013-12-31 LAB — MAGNESIUM: Magnesium: 2.1 mg/dL (ref 1.5–2.5)

## 2013-12-31 LAB — TROPONIN I
Troponin I: <0.3 ng/mL (ref <0.30)
Troponin I: <0.3 ng/mL (ref <0.30)

## 2013-12-31 LAB — PHOSPHORUS: PHOSPHORUS: 3.6 mg/dL (ref 2.3–4.6)

## 2013-12-31 MED ORDER — LEVOTHYROXINE SODIUM 50 MCG PO TABS
50.0000 ug | ORAL_TABLET | Freq: Every day | ORAL | Status: DC
  Administered 2014-01-01 – 2014-01-02 (×2): 50 ug via ORAL
  Filled 2013-12-31 (×3): qty 1

## 2013-12-31 MED ORDER — HYDROXYZINE HCL 25 MG PO TABS
25.0000 mg | ORAL_TABLET | Freq: Every evening | ORAL | Status: DC | PRN
  Filled 2013-12-31: qty 1

## 2013-12-31 MED ORDER — METHYLPREDNISOLONE SODIUM SUCC 40 MG IJ SOLR: 40.0000 mg | Freq: Four times a day (QID) | INTRAMUSCULAR | Status: DC

## 2013-12-31 MED ORDER — ALUM & MAG HYDROXIDE-SIMETH 200-200-20 MG/5ML PO SUSP
30.0000 mL | Freq: Once | ORAL | Status: AC
  Administered 2013-12-31: 30 mL via ORAL
  Filled 2013-12-31: qty 30

## 2013-12-31 MED ORDER — AMLODIPINE BESYLATE 10 MG PO TABS
10.0000 mg | ORAL_TABLET | Freq: Every day | ORAL | Status: DC
  Administered 2013-12-31 – 2014-01-01 (×2): 10 mg via ORAL
  Filled 2013-12-31 (×3): qty 1

## 2013-12-31 MED ORDER — HYDROXYZINE PAMOATE 25 MG PO CAPS: 25.0000 mg | ORAL_CAPSULE | Freq: Every evening | ORAL | Status: DC | PRN

## 2013-12-31 MED ORDER — OSELTAMIVIR PHOSPHATE 75 MG PO CAPS
75.0000 mg | ORAL_CAPSULE | Freq: Two times a day (BID) | ORAL | Status: DC
  Administered 2013-12-31 – 2014-01-01 (×3): 75 mg via ORAL
  Filled 2013-12-31 (×5): qty 1

## 2013-12-31 MED ORDER — METHYLPREDNISOLONE SODIUM SUCC 40 MG IJ SOLR
40.0000 mg | Freq: Three times a day (TID) | INTRAMUSCULAR | Status: DC
  Administered 2013-12-31 – 2014-01-01 (×3): 40 mg via INTRAVENOUS
  Filled 2013-12-31 (×6): qty 1

## 2013-12-31 MED ORDER — HALOPERIDOL 2 MG PO TABS
2.0000 mg | ORAL_TABLET | Freq: Every day | ORAL | Status: DC
  Administered 2013-12-31 – 2014-01-01 (×2): 2 mg via ORAL
  Filled 2013-12-31 (×3): qty 1

## 2013-12-31 MED ORDER — POTASSIUM CHLORIDE CRYS ER 20 MEQ PO TBCR
40.0000 meq | EXTENDED_RELEASE_TABLET | Freq: Once | ORAL | Status: AC
  Administered 2013-12-31: 40 meq via ORAL
  Filled 2013-12-31: qty 2

## 2013-12-31 NOTE — H&P (Signed)
PULMONARY  / CRITICAL CARE MEDICINE  Name: Maria Franklin MRN: [id number] DOB: 1961/08/15 PCP Webb Silversmith, NP Psychiatry: Dr Renaldo Fiddler   ADMISSION DATE:  12/30/2013  LOS 1 days   CONSULTATION DATE:  12/30/13  REFERRING MD :  Dr Raylene Miyamoto of ER PRIMARY SERVICE: PCCM  CHIEF COMPLAINT:  Submandibular swellng  BRIEF PATIENT DESCRIPTION: Possible Ludwig Angina  SIGNIFICANT EVENTS / STUDIES:  12/30/2013 - Admit possible ludwig   LINES / TUBES: none  CULTURES: Blood resp virus  ANTIBIOTICS: Anti-infectives   Start     Dose/Rate Route Frequency Ordered Stop   12/30/13 2000  clindamycin (CLEOCIN) IVPB 600 mg     600 mg 100 mL/hr over 30 Minutes Intravenous Every 6 hours 12/30/13 1336     12/30/13 1430  doxycycline (VIBRAMYCIN) 100 mg in dextrose 5 % 250 mL IVPB     100 mg 125 mL/hr over 120 Minutes Intravenous Every 12 hours 12/30/13 1336     12/30/13 1330  clindamycin (CLEOCIN) IVPB 600 mg     600 mg 100 mL/hr over 30 Minutes Intravenous  Once 12/30/13 1316 12/30/13 1400       HISTORY OF PRESENT ILLNESS:    53 year old obese AA female with BP (on ace inhibitor,norvac, and coreg), hypothryoid, asthma on advair. And also paranoid schizophrenic (sees Dr Marchia Bond). Went to [facility] ER 12/25/13 with dyspnea and reportedly diagnosed as AE -asthma and discharged on prednisone NNPS. On 12/29/13 noticed acute onset of left sided submandibular swelling that on moring of 12/30/13 had progressed ot larger size and also on right. In ER, had further progression of swelling. Some drooling of saliva reported by ER doc but denied by ENT and patientuncertain. Clearly no dysphagia or stridor. CT neck 12/30/2013 inflammation nos in floor of mouth. ENT seen by Dr Simeon Craft and found airway to be patent and has recommended medical mgmt. DDx of infection v ace inhibitor angioedema per ENT. PCCM admiting 12/30/2013. Note denies dental work recently  SUBJECTIVE:  NAD but still with submandibular  swelling VITAL SIGNS: Filed Vitals:   12/31/13 0700 12/31/13 0757 12/31/13 0800 12/31/13 0900  BP:  141/78  144/82  Pulse: 88 77 66   Temp:   98.5 F (36.9 C)   TempSrc:   Axillary   Resp: 20 18 17    Height:      Weight:      SpO2: 95% 93% 93%    INTAKE / OUTPUT: I/O last 3 completed shifts: In: 1473.8 [I.V.:1023.8; IV Piggyback:450] Out: -      PHYSICAL EXAMINATION: General:  Obese female. No distress Neuro:  AxOx3. Speech normal. Moves all ext x  4s HEENT:  No drooling of saliva. Oral cavity looks normal. Large indurated, hard,  One crepitus bilateral submandibular swelling + Cardiovascular:  RRR. No murmurs Lungs:No distress. No stridor. Easy air movement Abdomen:  Soft . No mass. No organomegaly Musculoskeletal:  No cyanosis. No clubbing. No edema Skin:  Intact  LABS: PULMONARY No results found for this basename: PHART, PCO2, PCO2ART, PO2, PO2ART, HCO3, TCO2, O2SAT,  in the last 168 hours  CBC  Recent Labs Lab 12/25/13 1601 12/30/13 1056 12/31/13 0215  HGB 11.2* 11.2* 11.0*  HCT 33.9* 34.3* 33.9*  WBC 6.4 7.8 6.2  PLT 221 277 264    COAGULATION No results found for this basename: INR,  in the last 168 hours  [facility] Lab 12/30/13 1543 12/31/13 0215 12/31/13 0740  TROPONINI <0.30 <0.30 <0.30  Recent Labs Lab 12/30/13 1545  PROBNP 63.5     CHEMISTRY  Recent Labs Lab 12/25/13 1601 12/30/13 1056 12/30/13 1543 12/31/13 0215  NA 139 140  --  138  K 3.8 3.3*  --  3.5*  CL 100 98  --  97  CO2 26 28  --  26  GLUCOSE 115* 87  --  207*  BUN 13 14  --  13  CREATININE 1.07 1.09  --  1.07  CALCIUM 9.1 9.2  --  8.3*  MG  --   --  2.1 2.1  PHOS  --   --  3.0 3.6   Estimated Creatinine Clearance: 72.1 ml/min (by C-G formula based on Cr of 1.07).   LIVER  Recent Labs Lab 12/25/13 1601  AST 20  ALT 24  ALKPHOS 85  BILITOT <0.2*  PROT 7.7  ALBUMIN 3.4*     INFECTIOUS  Recent Labs Lab 12/30/13 1536  12/30/13 1543  LATICACIDVEN 1.4  --   PROCALCITON  --  <0.10     ENDOCRINE CBG (last 3)  No results found for this basename: GLUCAP,  in the last 72 hours       IMAGING x48h  Dg Chest 2 View  12/30/2013   CLINICAL DATA:  Short of breath  EXAM: CHEST  2 VIEW  COMPARISON:  12/25/2013  FINDINGS: Normal heart size.  Clear lungs.  IMPRESSION: Negative.   Electronically Signed   By: Maryclare Bean M.D.   On: 12/30/2013 11:07   Ct Soft Tissue Neck W Contrast  12/30/2013   ADDENDUM REPORT: 12/30/2013 13:09  ADDENDUM: The patient is on an ACE inhibitor and therefore it is possible that inflammatory process represent noninfectious angio neurotic edema. Infectious process still not excluded in proper clinical setting. If the patient has progressive symptoms, close followup imaging may be considered. Results discussed with Dr. Zenia Resides by phone 12/30/2013 1 p.m.   Electronically Signed   By: Chauncey Cruel M.D.   On: 12/30/2013 13:09   12/30/2013   CLINICAL DATA:  Redness under chin.  Jaw swelling.  EXAM: CT NECK WITH CONTRAST  TECHNIQUE: Multidetector CT imaging of the neck was performed using the standard protocol following the bolus administration of intravenous contrast.  CONTRAST:  175mL OMNIPAQUE IOHEXOL 300 MG/ML  SOLN  COMPARISON:  None.  FINDINGS: Inflammation subcutaneous region inferior to the floor of the mouth. Inflammation of the left parapharyngeal region extending to the retropharyngeal region. Mild edema/inflammation extends inferiorly superficial to the left strap muscles and superiorly superficial to the left masseter muscle/ platysma muscle.  Source of this inflammatory process is indeterminate. It is possible this is related to inflammation of the left palatine tonsil however the tonsils does not appear significantly enlarged. Presently no well-defined drainable abscess.  No dental source identified as cause of inflammatory process. The submandibular glands and parotid glands appear fairly  symmetric without duct dilation or stone identified.  Epiglottis does not appear inflamed. Asymmetric appearance of the piriform sinus.  6 mm right thyroid lesion incompletely assessed.  Lung apices clear.  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 Almost complete opacification right maxillary sinus. Polypoid opacification nasal turbinates more notable on the right. Prominent mucosal thickening sphenoid sinus air cells with left sphenoid sinus air cell dominant. Opacification right ethmoid sinus air cell and moderate mucosal thickening left ethmoid sinus air cell with appearance to prior partial ethmoidectomy. Sinus opacification slightly dense and may represent inspissated material. Fungal disease not excluded.  Visualized  mastoid air cells and middle ear cavities are clear.  Cervical spondylotic changes. Visualized orbital intracranial structures unremarkable.  IMPRESSION: Inflammation subcutaneous region inferior to the floor of the mouth. Inflammation of the left parapharyngeal region extending to the retropharyngeal region. Mild edema / inflammation extends inferiorly superficial to the left strap muscles and superiorly superficial to the left masseter muscle/ platysma muscle.  Source of this inflammatory process is indeterminate. It is possible this is related to inflammation of the left palatine tonsil however the tonsils does not appear significantly enlarged. Presently no well-defined drainable abscess.  Prominent paranasal sinus disease as noted above  6 mm right thyroid lesion incompletely assessed  Electronically Signed: By: Chauncey Cruel M.D. On: 12/30/2013 13:00       ASSESSMENT / PLAN:  PULMONARY A:  Baseline "asthma" NOS and on ace inhibiors Currently At risk for Stridory andUpper Airway obstruction due to [employer] Possible but ulikely angioedema  P:   DC ace inhibitor -> BD -> needs pulm fu to assess for asthma with exhaled NO asssessment Monitor in SDU  for UAO and tracheostomy need for at least 24 more hours. 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 Sepsis but PCT and lactate negative P:   Clinda per ENT Doxy per ENT Check Resp virus PCR   ENDOCRINE   A:  At risk for hyperglycemia due to steroids   P:   ICU hyperglycemia protcol  NEUROLOGIC A:  Hx of schizophrenia . Currenty pleasant P:   Monitor Restart haldol, vistaril  TODAY'S SUMMARY: Monitor for 24 hours, advance diet. Possible dc in 24-48 hrs with PCCM follow up.   Richardson Landry Minor ACNP Maryanna Shape PCCM Pager [phone] till 3 pm If no answer page [phone] 12/31/2013, 10:12 AM   Staff note  See my comments above and rest per NP   Dr. Brand Males, M.D., [facility].C.P Pulmonary and Critical Care Medicine Staff Physician [place] [facility] Pager: [phone], If no answer or between  15:00h - 7:00h: call [phone]  12/31/2013 12:25 PM

## 2013-12-31 NOTE — Progress Notes (Signed)
Respiratory viral panel from 12/30/13 positive for Influenza A (H3).  Will order droplet isolation and start tamiflu.  Chesley Mires, MD 12/31/2013, 10:54 PM

## 2013-12-31 NOTE — Progress Notes (Signed)
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   (If response is "NO", the following Medicare IM given date fields will be blank) Date Medicare IM given:   Date Additional Medicare IM given:    Discharge Disposition:    Per UR Regulation:  Reviewed for med. necessity/level of care/duration of stay  If discussed at [place] of Stay Meetings, dates discussed:    Comments:  [id number]/Rhonda Eldridge Dace, BSN, [place] [phone] Chart Reviewed for discharge and hospital needs. Discharge needs at time of review:  None present will follow for needs. Review of patient progress due on [id number].

## 2013-12-31 NOTE — Significant Event (Signed)
Pt c/o indigestion and gassy feeling.  Will give maalox 30 ml x one and monitor.  Chesley Mires, MD 12/31/2013, 9:00 PM

## 2014-01-01 ENCOUNTER — Inpatient Hospital Stay (HOSPITAL_COMMUNITY): Payer: Medicare Other

## 2014-01-01 DIAGNOSIS — T783XXA Angioneurotic edema, initial encounter: Secondary | ICD-10-CM

## 2014-01-01 DIAGNOSIS — K122 Cellulitis and abscess of mouth: Secondary | ICD-10-CM

## 2014-01-01 LAB — BASIC METABOLIC PANEL
BUN: 17 mg/dL (ref 6–23)
CO2: 23 mEq/L (ref 19–32)
Calcium: 8 mg/dL — ABNORMAL LOW (ref 8.4–10.5)
Chloride: 100 mEq/L (ref 96–112)
Creatinine, Ser: 1.05 mg/dL (ref 0.50–1.10)
GFR calc Af Amer: 70 mL/min — ABNORMAL LOW (ref >90)
GFR, EST NON AFRICAN AMERICAN: 60 mL/min — AB (ref >90)
Glucose, Bld: 203 mg/dL — ABNORMAL HIGH (ref 70–99)
POTASSIUM: 3.5 meq/L — AB (ref 3.7–5.3)
SODIUM: 138 meq/L (ref 137–147)

## 2014-01-01 LAB — CBC
HCT: 32 % — ABNORMAL LOW (ref 36.0–46.0)
Hemoglobin: 10.9 g/dL — ABNORMAL LOW (ref 12.0–15.0)
MCH: 28.1 pg (ref 26.0–34.0)
MCHC: 34.1 g/dL (ref 30.0–36.0)
MCV: 82.5 fL (ref 78.0–100.0)
PLATELETS: 276 10*3/uL (ref 150–400)
RBC: 3.88 MIL/uL (ref 3.87–5.11)
RDW: 13.5 % (ref 11.5–15.5)
WBC: 13.1 10*3/uL — ABNORMAL HIGH (ref 4.0–10.5)

## 2014-01-01 LAB — TROPONIN I: Troponin I: <0.3 ng/mL (ref <0.30)

## 2014-01-01 MED ORDER — PREDNISONE 20 MG PO TABS
40.0000 mg | ORAL_TABLET | Freq: Every day | ORAL | Status: DC
  Administered 2014-01-02: 40 mg via ORAL
  Filled 2014-01-01 (×2): qty 2

## 2014-01-01 MED ORDER — DOXYCYCLINE HYCLATE 100 MG PO TABS
100.0000 mg | ORAL_TABLET | Freq: Two times a day (BID) | ORAL | Status: DC
  Administered 2014-01-01: 100 mg via ORAL
  Filled 2014-01-01 (×3): qty 1

## 2014-01-01 MED ORDER — GI COCKTAIL ~~LOC~~
30.0000 mL | Freq: Once | ORAL | Status: AC
  Administered 2014-01-01: 30 mL via ORAL
  Filled 2014-01-01: qty 30

## 2014-01-01 MED ORDER — NEBIVOLOL HCL 5 MG PO TABS
5.0000 mg | ORAL_TABLET | Freq: Every day | ORAL | Status: DC
  Administered 2014-01-01: 5 mg via ORAL
  Filled 2014-01-01 (×2): qty 1

## 2014-01-01 NOTE — Progress Notes (Signed)
PULMONARY  / CRITICAL CARE MEDICINE  Name: Maria Franklin MRN: [id number] DOB: 1961/11/07 PCP Webb Silversmith, NP Psychiatry: Dr Renaldo Fiddler   ADMISSION DATE:  12/30/2013  LOS 2 days   CONSULTATION DATE:  12/30/13  REFERRING MD :  Dr Raylene Miyamoto of ER PRIMARY SERVICE: PCCM  CHIEF COMPLAINT:  Submandibular swellng  BRIEF PATIENT DESCRIPTION: Possible Ludwig Angina  SIGNIFICANT EVENTS / STUDIES:  12/30/2013 - Admit possible ludwig   LINES / TUBES: none  CULTURES: Blood>> resp virus>>++flu a  ANTIBIOTICS: Anti-infectives   Start     Dose/Rate Route Frequency Ordered Stop   12/31/13 2300  oseltamivir (TAMIFLU) capsule 75 mg     75 mg Oral 2 times daily 12/31/13 2253 01/05/14 2159   12/30/13 2000  clindamycin (CLEOCIN) IVPB 600 mg     600 mg 100 mL/hr over 30 Minutes Intravenous Every 6 hours 12/30/13 1336     12/30/13 1430  doxycycline (VIBRAMYCIN) 100 mg in dextrose 5 % 250 mL IVPB     100 mg 125 mL/hr over 120 Minutes Intravenous Every 12 hours 12/30/13 1336     12/30/13 1330  clindamycin (CLEOCIN) IVPB 600 mg     600 mg 100 mL/hr over 30 Minutes Intravenous  Once 12/30/13 1316 12/30/13 1400       HISTORY OF PRESENT ILLNESS:    53 year old obese AA female with BP (on ace inhibitor,norvac, and coreg), hypothryoid, asthma on advair. And also paranoid schizophrenic (sees Dr Marchia Bond). Went to [facility] ER 12/25/13 with dyspnea and reportedly diagnosed as AE -asthma and discharged on prednisone NNPS. On 12/29/13 noticed acute onset of left sided submandibular swelling that on moring of 12/30/13 had progressed ot larger size and also on right. In ER, had further progression of swelling. Some drooling of saliva reported by ER doc but denied by ENT and patientuncertain. Clearly no dysphagia or stridor. CT neck 12/30/2013 inflammation nos in floor of mouth. ENT seen by Dr Simeon Craft and found airway to be patent and has recommended medical mgmt. DDx of infection v ace inhibitor angioedema  per ENT. PCCM admiting 12/30/2013. Note denies dental work recently  SUBJECTIVE:  NAD with decreased submandibular swelling VITAL SIGNS: Filed Vitals:   01/01/14 0500 01/01/14 0541 01/01/14 0600 01/01/14 0700  BP: 125/71  147/92 142/84  Pulse: 71  70 84  Temp:      TempSrc:      Resp: 14  14 18   Height:      Weight:  234 lb 12.6 oz (106.5 kg)    SpO2: 100%  100% 100%   INTAKE / OUTPUT: I/O last 3 completed shifts: In: 3424.8 [P.O.:240; I.V.:2284.8; IV Piggyback:900] Out: -      PHYSICAL EXAMINATION: General:  Obese female. No distress Neuro:  AxOx3. Speech normal. Moves all ext x  4s HEENT:  No drooling of saliva. Oral cavity looks normal. Decreased submandibular swelling Cardiovascular:  RRR. No murmurs Lungs:No distress. No stridor. Easy air movement Abdomen:  Soft . No mass. No organomegaly Musculoskeletal:  No cyanosis. No clubbing. No edema Skin:  Intact  LABS: PULMONARY No results found for this basename: PHART, PCO2, PCO2ART, PO2, PO2ART, HCO3, TCO2, O2SAT,  in the last 168 hours  CBC  Recent Labs Lab 12/30/13 1056 12/31/13 0215 01/01/14 0330  HGB 11.2* 11.0* 10.9*  HCT 34.3* 33.9* 32.0*  WBC 7.8 6.2 13.1*  PLT 277 264 276    COAGULATION No results found for this basename: INR,  in the last 168 hours  CARDIAC    Recent Labs Lab 12/30/13 1543 12/31/13 0215 12/31/13 0740  TROPONINI <0.30 <0.30 <0.30    Recent Labs Lab 12/30/13 1545  PROBNP 63.5     CHEMISTRY  Recent Labs Lab 12/25/13 1601 12/30/13 1056 12/30/13 1543 12/31/13 0215 01/01/14 0330  NA 139 140  --  138 138  K 3.8 3.3*  --  3.5* 3.5*  CL 100 98  --  97 100  CO2 26 28  --  26 23  GLUCOSE 115* 87  --  207* 203*  BUN 13 14  --  13 17  CREATININE 1.07 1.09  --  1.07 1.05  CALCIUM 9.1 9.2  --  8.3* 8.0*  MG  --   --  2.1 2.1  --   PHOS  --   --  3.0 3.6  --    Estimated Creatinine Clearance: 74.6 ml/min (by C-G formula based on Cr of 1.05).   LIVER  Recent  Labs Lab 12/25/13 1601  AST 20  ALT 24  ALKPHOS 85  BILITOT <0.2*  PROT 7.7  ALBUMIN 3.4*     INFECTIOUS  Recent Labs Lab 12/30/13 1536 12/30/13 1543  LATICACIDVEN 1.4  --   PROCALCITON  --  <0.10     ENDOCRINE CBG (last 3)  No results found for this basename: GLUCAP,  in the last 72 hours       IMAGING x48h  Dg Chest 2 View  12/30/2013   CLINICAL DATA:  Short of breath  EXAM: CHEST  2 VIEW  COMPARISON:  12/25/2013  FINDINGS: Normal heart size.  Clear lungs.  IMPRESSION: Negative.   Electronically Signed   By: Maryclare Bean M.D.   On: 12/30/2013 11:07   Ct Soft Tissue Neck W Contrast  12/30/2013   ADDENDUM REPORT: 12/30/2013 13:09  ADDENDUM: The patient is on an ACE inhibitor and therefore it is possible that inflammatory process represent noninfectious angio neurotic edema. Infectious process still not excluded in proper clinical setting. If the patient has progressive symptoms, close followup imaging may be considered. Results discussed with Dr. Zenia Resides by phone 12/30/2013 1 p.m.   Electronically Signed   By: Chauncey Cruel M.D.   On: 12/30/2013 13:09   12/30/2013   CLINICAL DATA:  Redness under chin.  Jaw swelling.  EXAM: CT NECK WITH CONTRAST  TECHNIQUE: Multidetector CT imaging of the neck was performed using the standard protocol following the bolus administration of intravenous contrast.  CONTRAST:  130mL OMNIPAQUE IOHEXOL 300 MG/ML  SOLN  COMPARISON:  None.  FINDINGS: Inflammation subcutaneous region inferior to the floor of the mouth. Inflammation of the left parapharyngeal region extending to the retropharyngeal region. Mild edema/inflammation extends inferiorly superficial to the left strap muscles and superiorly superficial to the left masseter muscle/ platysma muscle.  Source of this inflammatory process is indeterminate. It is possible this is related to inflammation of the left palatine tonsil however the tonsils does not appear significantly enlarged. Presently no  well-defined drainable abscess.  No dental source identified as cause of inflammatory process. The submandibular glands and parotid glands appear fairly symmetric without duct dilation or stone identified.  Epiglottis does not appear inflamed. Asymmetric appearance of the piriform sinus.  6 mm right thyroid lesion incompletely assessed.  Lung apices clear.  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 Almost complete opacification right maxillary sinus. Polypoid opacification nasal turbinates more notable on the right. Prominent mucosal thickening sphenoid sinus air cells with left sphenoid sinus air cell  dominant. Opacification right ethmoid sinus air cell and moderate mucosal thickening left ethmoid sinus air cell with appearance to prior partial ethmoidectomy. Sinus opacification slightly dense and may represent inspissated material. Fungal disease not excluded.  Visualized mastoid air cells and middle ear cavities are clear.  Cervical spondylotic changes. Visualized orbital intracranial structures unremarkable.  IMPRESSION: Inflammation subcutaneous region inferior to the floor of the mouth. Inflammation of the left parapharyngeal region extending to the retropharyngeal region. Mild edema / inflammation extends inferiorly superficial to the left strap muscles and superiorly superficial to the left masseter muscle/ platysma muscle.  Source of this inflammatory process is indeterminate. It is possible this is related to inflammation of the left palatine tonsil however the tonsils does not appear significantly enlarged. Presently no well-defined drainable abscess.  Prominent paranasal sinus disease as noted above  6 mm right thyroid lesion incompletely assessed  Electronically Signed: By: Chauncey Cruel M.D. On: 12/30/2013 13:00   Dg Chest Port 1 View  01/01/2014   CLINICAL DATA:  Respiratory stress.  EXAM: PORTABLE CHEST - 1 VIEW  COMPARISON:  Chest radiograph 12/30/2013.  FINDINGS: Stable  cardiac and mediastinal contours. Lungs are clear. No pleural effusion or pneumothorax. Regional skeleton is unremarkable.  IMPRESSION: No acute cardiopulmonary process.   Electronically Signed   By: Lovey Newcomer M.D.   On: 01/01/2014 07:51       ASSESSMENT / PLAN:  PULMONARY A:  Baseline "asthma" NOS and on ace inhibiors Currently At risk for Stridory andUpper Airway obstruction due to [employer] Possible but ulikely angioedema  P:   DC ace inhibitor -> BD -> needs pulm fu to assess for asthma with exhaled NO asssessment Monitor in SDU for UAO and tracheostomy need for at least 24 more hours. 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 Sepsis but PCT and lactate negative P:   Clinda per ENT Doxy per ENT Check Resp virus PCR ++ hflu 5 days tamiflu   ENDOCRINE   A:  At risk for hyperglycemia due to steroids   P:   ICU hyperglycemia protcol  NEUROLOGIC A:  Hx of schizophrenia . Currenty pleasant P:   Monitor Restart haldol, vistaril  TODAY'S SUMMARY: Dc 1/27 with FU in pulmonary office within 7-10 days. . Taper steroids to off.. No ACE-I ever. Start bystolic 5 mg daily. 5 days of Tamiflu.    Richardson Landry Minor ACNP Maryanna Shape PCCM Pager [phone] till 3 pm If no answer page [phone] 01/01/2014, 8:52 AM    STAFF NOTE See NP for dc plan. Can go home. Will Rx Flu. Will dc on clinda PO  X 2 weeks. DC doxy. Needs close  followup   > 35 min dc planning   Dr. Brand Males, M.D., [facility].C.P Pulmonary and Critical Care Medicine Staff Physician [place] [facility] Pager: [phone]  [phone], If no answer or between  15:00h - 7:00h: call [phone]  01/01/2014 12:23 PM

## 2014-01-01 NOTE — Progress Notes (Signed)
Inpatient Diabetes Program Recommendations  AACE/ADA: New Consensus Statement on Inpatient Glycemic Control (2013)  Target Ranges:  Prepandial:   less than 140 mg/dL      Peak postprandial:   less than 180 mg/dL (1-2 hours)      Critically ill patients:  140 - 180 mg/dL   Results for Maria Franklin, Maria Franklin (MRN [id number]) as of 01/01/2014 13:37  Ref. Range 12/30/2013 10:56 12/31/2013 02:15 01/01/2014 03:30  Glucose Latest Range: 70-99 mg/dL 87 207 (H) 203 (H)   Diabetes history: No Outpatient Diabetes medications: NA Current orders for Inpatient glycemic control: None  Inpatient Diabetes Program Recommendations Correction (SSI): While inpatient and receiving steroids, please order CBGs with Novolog correction scale. Noted in Dr. Golden Pop progress note for 1/26 and today patient would be started on ICU hyperglycemia protocol but it has not been ordered. HgbA1C: Please consider ordering an A1C to evaluate glycemic control over the past 2-3 months..  Last A1C in the chart was 6.2% on 01/10/2013.  Note: If patient is not discharged today, please order CBGs with Novolog correction scale while inpatient and receiving steroids.  Thanks, Barnie Alderman, RN, MSN, CCRN Diabetes Coordinator Inpatient Diabetes Program [phone] (Team Pager) [phone] (AP office) [phone] [facility] office)

## 2014-01-01 NOTE — Progress Notes (Addendum)
C/o chest discomfort nos  Plan Stat ekg Stat troponin and q8hx 24h  Dr. Brand Males, M.D., [facility].C.P Pulmonary and Critical Care Medicine Staff Physician [place] [facility] Pager: [phone], If no answer or between  15:00h - 7:00h: call [phone]  01/01/2014 12:25 PM

## 2014-01-02 DIAGNOSIS — J09X2 Influenza due to identified novel influenza A virus with other respiratory manifestations: Secondary | ICD-10-CM

## 2014-01-02 DIAGNOSIS — R079 Chest pain, unspecified: Secondary | ICD-10-CM

## 2014-01-02 LAB — TROPONIN I

## 2014-01-02 MED ORDER — OSELTAMIVIR PHOSPHATE 75 MG PO CAPS: 75.0000 mg | ORAL_CAPSULE | Freq: Two times a day (BID) | ORAL | Status: DC

## 2014-01-02 MED ORDER — CLINDAMYCIN HCL 300 MG PO CAPS: 300.0000 mg | ORAL_CAPSULE | Freq: Three times a day (TID) | ORAL | Status: DC

## 2014-01-02 MED ORDER — NEBIVOLOL HCL 5 MG PO TABS: 5.0000 mg | ORAL_TABLET | Freq: Every day | ORAL | Status: DC

## 2014-01-02 MED ORDER — PREDNISONE 10 MG PO TABS: ORAL_TABLET | ORAL | Status: DC

## 2014-01-02 NOTE — Discharge Summary (Signed)
Physician Discharge Summary  Patient ID: Maria Franklin MRN: [id number] DOB/AGE: 1961-05-09 53 y.o.  Admit date: 12/30/2013 Discharge date: 01/02/2014  Problem List Active Problems:   Hypertension   Hyperlipidemia   Glucose intolerance (impaired glucose tolerance)   Hypothyroidism   Obesity   Hypokalemia   Angina, Ludwig   Sepsis   Chest pain  HPI: 53 year old obese AA female with BP (on ace inhibitor,norvac, and coreg), hypothryoid, asthma on advair. And also paranoid schizophrenic (sees Dr Marchia Bond). Went to [facility] ER 12/25/13 with dyspnea and reportedly diagnosed as AE -asthma and discharged on prednisone NNPS. On 12/29/13 noticed acute onset of left sided submandibular swelling that on moring of 12/30/13 had progressed ot larger size and also on right. In ER, had further progression of swelling. Some drooling of saliva reported by ER doc but denied by ENT and patientuncertain. Clearly no dysphagia or stridor. CT neck 12/30/2013 inflammation nos in floor of mouth. ENT seen by Dr Simeon Craft and found airway to be patent and has recommended medical mgmt. DDx of infection v ace inhibitor angioedema per ENT. PCCM admiting 12/30/2013. Note denies dental work recently     Hospital Course:  [place] / STUDIES:  12/30/2013 - Admit possible ludwig  LINES / TUBES:  none  CULTURES:  Blood>>  resp virus>>++flu a  ANTIBIOTICS:  Anti-infectives    Start    Dose/Rate  Route  Frequency  Ordered  Stop    12/31/13 2300   oseltamivir (TAMIFLU) capsule 75 mg  75 mg  Oral  2 times daily  12/31/13 2253  01/05/14 2159    12/30/13 2000   clindamycin (CLEOCIN) IVPB 600 mg  600 mg  100 mL/hr over 30 Minutes  Intravenous  Every 6 hours  12/30/13 1336     12/30/13 1430   doxycycline (VIBRAMYCIN) 100 mg in dextrose 5 % 250 mL IVPB  100 mg  125 mL/hr over 120 Minutes  Intravenous  Every 12 hours  12/30/13 1336     12/30/13 1330   clindamycin (CLEOCIN) IVPB 600 mg  600 mg  100 mL/hr over 30 Minutes   Intravenous  Once  12/30/13 1316  12/30/13 1400      12/30/2013 CLINICAL DATA: Redness under chin. Jaw swelling. EXAM: CT NECK WITH CONTRAST TECHNIQUE: Multidetector CT imaging of the neck was performed using the standard protocol following the bolus administration of intravenous contrast. CONTRAST: 127mL OMNIPAQUE IOHEXOL 300 MG/ML SOLN COMPARISON: None. FINDINGS: Inflammation subcutaneous region inferior to the floor of the mouth. Inflammation of the left parapharyngeal region extending to the retropharyngeal region. Mild edema/inflammation extends inferiorly superficial to the left strap muscles and superiorly superficial to the left masseter muscle/ platysma muscle. Source of this inflammatory process is indeterminate. It is possible this is related to inflammation of the left palatine tonsil however the tonsils does not appear significantly enlarged. Presently no well-defined drainable abscess. No dental source identified as cause of inflammatory process. The submandibular glands and parotid glands appear fairly symmetric without duct dilation or stone identified. Epiglottis does not appear inflamed. Asymmetric appearance of the piriform sinus. 6 mm right thyroid lesion incompletely assessed. Lung apices clear. 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 Almost complete opacification right maxillary sinus. Polypoid opacification nasal turbinates more notable on the right. Prominent mucosal thickening sphenoid sinus air cells with left sphenoid sinus air cell dominant. Opacification right ethmoid sinus air cell and moderate mucosal thickening left ethmoid sinus air cell with appearance to prior partial ethmoidectomy. Sinus  opacification slightly dense and may represent inspissated material. Fungal disease not excluded. Visualized mastoid air cells and middle ear cavities are clear. Cervical spondylotic changes. Visualized orbital intracranial structures unremarkable. IMPRESSION:  Inflammation subcutaneous region inferior to the floor of the mouth. Inflammation of the left parapharyngeal region extending to the retropharyngeal region. Mild edema / inflammation extends inferiorly superficial to the left strap muscles and superiorly superficial to the left masseter muscle/ platysma muscle. Source of this inflammatory process is indeterminate. It is possible this is related to inflammation of the left palatine tonsil however the tonsils does not appear significantly enlarged. Presently no well-defined drainable abscess. Prominent paranasal sinus disease as noted above 6 mm right thyroid lesion incompletely assessed Electronically Signed: By: Chauncey Cruel M.D. On: 12/30/2013 13:00  ASSESSMENT / PLAN:  PULMONARY  A:  Baseline "asthma" NOS and on ace inhibiors  Currently At risk for Stridory andUpper Airway obstruction due to [employer]  Possible but ulikely angioedema  P:  DC ace inhibitor -> BD -> needs pulm fu to assess for asthma with exhaled NO asssessment  Monitor in SDU for UAO and tracheostomy need for at least 24 more hours.  Steroids, h2 blockade->1/27 change to po  CARDIOVASCULAR  A: Hypertension 0p - on norvasc, coreg and ace inhibitor at home  1/27 chest pain  P:  Restart norvasc  No coreg and no ace inhbitor due to asthma hx  bystolic 5 mg starting dose  Serial cardiac markers and 12 lead negative. 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 Sepsis but PCT and lactate negative  P:  Clinda per ENT  Doxy per ENT  Check Resp virus PCR ++ hflu 5 days tamiflu  ENDOCRINE  A: At risk for hyperglycemia due to steroids  P:  ICU hyperglycemia protcol   NEUROLOGIC  A: Hx of schizophrenia . Currenty pleasant  P:  Monitor  Restart haldol, vistaril         Labs at discharge Lab Results  Component Value Date   CREATININE 1.05 01/01/2014   BUN 17 01/01/2014   NA 138 01/01/2014   K 3.5* 01/01/2014   CL 100 01/01/2014   CO2 23 01/01/2014   Lab Results  Component Value Date   WBC 13.1* 01/01/2014   HGB 10.9* 01/01/2014   HCT 32.0* 01/01/2014   MCV 82.5 01/01/2014   PLT 276 01/01/2014   Lab Results  Component Value Date   ALT 24 12/25/2013   AST 20 12/25/2013   ALKPHOS 85 12/25/2013   BILITOT <0.2* 12/25/2013   No results found for this basename: INR,  PROTIME    Current radiology studies Dg Chest Port 1 View  01/01/2014   CLINICAL DATA:  Respiratory stress.  EXAM: PORTABLE CHEST - 1 VIEW  COMPARISON:  Chest radiograph 12/30/2013.  FINDINGS: Stable cardiac and mediastinal contours. Lungs are clear. No pleural effusion or pneumothorax. Regional skeleton is unremarkable.  IMPRESSION: No acute cardiopulmonary process.   Electronically Signed   By: Lovey Newcomer M.D.   On: 01/01/2014 07:51    Disposition:  01-Home or Self Care  Discharge Orders   Future Appointments Provider Department Dept Phone   01/09/2014 10:30 AM Melvenia Needles, NP [facility] [phone]   01/14/2014 10:30 AM Stacy [name], PA-C [place] [phone]   Future Orders Complete By Expires   Discharge patient  As directed        Medication List    STOP taking these medications       azithromycin 250 MG tablet  Commonly known as:  ZITHROMAX     carvedilol 12.5 MG tablet  Commonly known as:  COREG     ibuprofen 200 MG tablet  Commonly known as:  ADVIL,MOTRIN     lisinopril 40 MG tablet  Commonly known as:  PRINIVIL,ZESTRIL      TAKE these medications       albuterol 108 (90 BASE) MCG/ACT inhaler  Commonly known as:  PROVENTIL HFA;VENTOLIN HFA  Inhale 1-2 puffs into the lungs every 6 (six) hours as needed  for wheezing or shortness of breath.     amLODipine 10 MG tablet  Commonly known as:  NORVASC  Take 10 mg by mouth daily.     atorvastatin 10 MG tablet  Commonly known as:  LIPITOR  Take 10 mg by mouth daily.     B-complex with vitamin C tablet  Take 1 tablet by mouth daily.     cetirizine 10 MG tablet  Commonly known as:  ZYRTEC  Take 10 mg by mouth daily.     clindamycin 300 MG capsule  Commonly known as:  CLEOCIN  Take 1 capsule (300 mg total) by mouth 3 (three) times daily.     FISH OIL PO  Take 1 capsule by mouth daily.     Fluticasone-Salmeterol 250-50 MCG/DOSE Aepb  Commonly known as:  ADVAIR  Inhale 1 puff into the lungs 2 (two) times daily.     haloperidol 2 MG tablet  Commonly known as:  HALDOL  Take 1 tablet (2 mg total) by mouth at bedtime.     hydrochlorothiazide 25 MG tablet  Commonly known as:  HYDRODIURIL  Take 25 mg by mouth daily.     hydrOXYzine 25 MG capsule  Commonly known as:  VISTARIL  Take 1 capsule (25 mg total) by mouth at bedtime as needed for anxiety (for insomnia and anxiety).     levothyroxine 50 MCG tablet  Commonly known as:  SYNTHROID, LEVOTHROID  Take 50 mcg by mouth daily before breakfast.     mometasone 50 MCG/ACT nasal spray  Commonly known as:  NASONEX  Place 2 sprays into the nose daily.     nebivolol 5 MG tablet  Commonly known as:  BYSTOLIC  Take 1 tablet (5 mg total) by mouth daily.     oseltamivir 75 MG capsule  Commonly known as:  TAMIFLU  Take 1 capsule (75 mg total) by mouth 2 (two) times daily.     potassium chloride SA 20 MEQ tablet  Commonly known as:  K-DUR,KLOR-CON  Take 20 mEq by mouth 2 (two) times daily.     predniSONE 10 MG tablet  Commonly known as:  DELTASONE  Take 4 tabs  daily with food x 4 days, then 3 tabs daily x 4 days, then 2 tabs daily x 4 days, then 1 tab daily x4 days then stop. #40     VISINE 0.05 % ophthalmic solution  Generic drug:  tetrahydrozoline  Place 2 drops into both eyes.        Follow-up Information  Follow up with PARRETT,TAMMY, NP On 01/07/2014. (10:30 am)    Specialty:  Nurse Practitioner   Contact information:   [place]. [place] [address] [phone]        Discharged Condition: good  Time spent on discharge greater than 40 minutes.  Vital signs at Discharge. Temp:  [97.7 F (36.5 C)-98.4 F (36.9 C)] 98.4 F (36.9 C) (01/28 0400) Pulse Rate:  [63-105] 74 (01/28 0730) Resp:  [14-24] 16 (01/28 0730) BP: (127-162)/(79-109) 127/82 mmHg (01/28 0730) SpO2:  [93 %-100 %] 94 % (01/28 0730) Weight:  [235 lb 3.7 oz (106.7 kg)] 235 lb 3.7 oz (106.7 kg) (01/28 0400) Office follow up Special Information or instructions.  Note she has been changed from lisinopril to Bystolic due to stridor and throat swelling. Steroid taper with induced hyperglycemia. Check CBG in office she may need treatment for diabetes in future. Total of 2 weeks antibiotics given and 5 days of tamiflu.     Signed: Richardson Landry Minor ACNP [facility] Shape PCCM Pager [phone] till 3 pm If no answer page [phone] 01/02/2014, 8:12 AM

## 2014-01-02 NOTE — Progress Notes (Signed)
Discharge and follow up instructions reviewed with patient using teach back method. No questions at this time.

## 2014-01-05 LAB — CULTURE, BLOOD (ROUTINE X 2)
Culture: NO GROWTH
Culture: NO GROWTH

## 2014-01-08 DIAGNOSIS — J09X2 Influenza due to identified novel influenza A virus with other respiratory manifestations: Secondary | ICD-10-CM

## 2014-01-08 NOTE — Discharge Summary (Signed)
Staff note  > 35 min dc planning  [place]udwig main diff v Flu with atypical features v ACE inhibitor related. Rx with opd clinda and steroids. No more ace inhibots Had atypical chest pain but MI ruled out.   Dr. Brand Males, M.D., [facility].C.P Pulmonary and Critical Care Medicine Staff Physician [facility] Pager: [phone], If no answer or between  15:00h - 7:00h: call [phone]  01/08/2014 9:33 PM

## 2014-01-09 ENCOUNTER — Inpatient Hospital Stay: Payer: Self-pay | Admitting: Adult Health

## 2014-01-14 ENCOUNTER — Ambulatory Visit (INDEPENDENT_AMBULATORY_CARE_PROVIDER_SITE_OTHER): Payer: Medicare Other

## 2014-01-14 ENCOUNTER — Ambulatory Visit: Payer: Medicare Other | Admitting: Internal Medicine

## 2014-01-14 ENCOUNTER — Encounter: Payer: Self-pay | Admitting: Physician Assistant

## 2014-01-14 ENCOUNTER — Ambulatory Visit (INDEPENDENT_AMBULATORY_CARE_PROVIDER_SITE_OTHER): Payer: Medicare Other | Admitting: Physician Assistant

## 2014-01-14 ENCOUNTER — Other Ambulatory Visit (HOSPITAL_COMMUNITY)
Admission: RE | Admit: 2014-01-14 | Discharge: 2014-01-14 | Disposition: A | Discharge status: Home / Self Care | Payer: Medicare Other | Source: Ambulatory Visit | Attending: Internal Medicine | Admitting: Internal Medicine

## 2014-01-14 VITALS — BP 140/96 | HR 89 | Temp 98.5°F | Ht 64.0 in | Wt 232.4 lb

## 2014-01-14 DIAGNOSIS — R7309 Other abnormal glucose: Secondary | ICD-10-CM | POA: Diagnosis not present

## 2014-01-14 DIAGNOSIS — J45909 Unspecified asthma, uncomplicated: Secondary | ICD-10-CM

## 2014-01-14 DIAGNOSIS — I10 Essential (primary) hypertension: Secondary | ICD-10-CM | POA: Diagnosis not present

## 2014-01-14 DIAGNOSIS — Z01419 Encounter for gynecological examination (general) (routine) without abnormal findings: Secondary | ICD-10-CM | POA: Diagnosis not present

## 2014-01-14 DIAGNOSIS — R739 Hyperglycemia, unspecified: Secondary | ICD-10-CM

## 2014-01-14 DIAGNOSIS — Z0000 Encounter for general adult medical examination without abnormal findings: Secondary | ICD-10-CM | POA: Diagnosis not present

## 2014-01-14 LAB — COMPREHENSIVE METABOLIC PANEL
ALT: 63 U/L — ABNORMAL HIGH (ref 0–35)
AST: 31 U/L (ref 0–37)
Albumin: 3.7 g/dL (ref 3.5–5.2)
Alkaline Phosphatase: 51 U/L (ref 39–117)
BILIRUBIN TOTAL: 0.6 mg/dL (ref 0.3–1.2)
BUN: 16 mg/dL (ref 6–23)
CO2: 27 meq/L (ref 19–32)
CREATININE: 1.1 mg/dL (ref 0.4–1.2)
Calcium: 9 mg/dL (ref 8.4–10.5)
Chloride: 100 mEq/L (ref 96–112)
GFR: 66.92 mL/min (ref >60.00)
GLUCOSE: 125 mg/dL — AB (ref 70–99)
Potassium: 3.4 mEq/L — ABNORMAL LOW (ref 3.5–5.1)
Sodium: 137 mEq/L (ref 135–145)
Total Protein: 7.3 g/dL (ref 6.0–8.3)

## 2014-01-14 LAB — LDL CHOLESTEROL, DIRECT: Direct LDL: 97.1 mg/dL

## 2014-01-14 LAB — CBC WITH DIFFERENTIAL/PLATELET
BASOS ABS: 0 10*3/uL (ref 0.0–0.1)
Basophils Relative: 0.1 % (ref 0.0–3.0)
Eosinophils Absolute: 0.1 10*3/uL (ref 0.0–0.7)
Eosinophils Relative: 0.5 % (ref 0.0–5.0)
HEMATOCRIT: 35.2 % — AB (ref 36.0–46.0)
Hemoglobin: 11.4 g/dL — ABNORMAL LOW (ref 12.0–15.0)
LYMPHS ABS: 1.7 10*3/uL (ref 0.7–4.0)
Lymphocytes Relative: 12.9 % (ref 12.0–46.0)
MCHC: 32.5 g/dL (ref 30.0–36.0)
MCV: 86.9 fl (ref 78.0–100.0)
MONOS PCT: 2.3 % — AB (ref 3.0–12.0)
Monocytes Absolute: 0.3 10*3/uL (ref 0.1–1.0)
NEUTROS PCT: 84.2 % — AB (ref 43.0–77.0)
Neutro Abs: 11.3 10*3/uL — ABNORMAL HIGH (ref 1.4–7.7)
PLATELETS: 227 10*3/uL (ref 150.0–400.0)
RBC: 4.05 Mil/uL (ref 3.87–5.11)
RDW: 14.9 % — AB (ref 11.5–14.6)
WBC: 13.4 10*3/uL — ABNORMAL HIGH (ref 4.5–10.5)

## 2014-01-14 LAB — LIPID PANEL
CHOLESTEROL: 231 mg/dL — AB (ref 0–200)
HDL: 113.2 mg/dL (ref >39.00)
Total CHOL/HDL Ratio: 2
Triglycerides: 79 mg/dL (ref 0.0–149.0)
VLDL: 15.8 mg/dL (ref 0.0–40.0)

## 2014-01-14 LAB — HEMOGLOBIN A1C: HEMOGLOBIN A1C: 6.9 % — AB (ref 4.6–6.5)

## 2014-01-14 LAB — TSH: TSH: 0.96 u[IU]/mL (ref 0.35–5.50)

## 2014-01-14 MED ORDER — LEVOTHYROXINE SODIUM 50 MCG PO TABS: 50.0000 ug | ORAL_TABLET | Freq: Every day | ORAL | Status: DC

## 2014-01-14 MED ORDER — MOMETASONE FUROATE 50 MCG/ACT NA SUSP: 2.0000 | Freq: Every day | NASAL | Status: DC

## 2014-01-14 MED ORDER — ALBUTEROL SULFATE HFA 108 (90 BASE) MCG/ACT IN AERS: 1.0000 | INHALATION_SPRAY | Freq: Four times a day (QID) | RESPIRATORY_TRACT | Status: DC | PRN

## 2014-01-14 MED ORDER — FLUTICASONE-SALMETEROL 250-50 MCG/DOSE IN AEPB: 1.0000 | INHALATION_SPRAY | Freq: Two times a day (BID) | RESPIRATORY_TRACT | Status: DC

## 2014-01-14 MED ORDER — POTASSIUM CHLORIDE CRYS ER 20 MEQ PO TBCR: 20.0000 meq | EXTENDED_RELEASE_TABLET | Freq: Two times a day (BID) | ORAL | Status: DC

## 2014-01-14 MED ORDER — B COMPLEX-C PO TABS: 1.0000 | ORAL_TABLET | Freq: Every day | ORAL | Status: DC

## 2014-01-14 MED ORDER — ATORVASTATIN CALCIUM 10 MG PO TABS: 10.0000 mg | ORAL_TABLET | Freq: Every day | ORAL | Status: DC

## 2014-01-14 MED ORDER — AMLODIPINE BESYLATE 10 MG PO TABS: 10.0000 mg | ORAL_TABLET | Freq: Every day | ORAL | Status: DC

## 2014-01-14 MED ORDER — HYDROCHLOROTHIAZIDE 25 MG PO TABS: 25.0000 mg | ORAL_TABLET | Freq: Every day | ORAL | Status: DC

## 2014-01-14 MED ORDER — CETIRIZINE HCL 10 MG PO TABS: 10.0000 mg | ORAL_TABLET | Freq: Every day | ORAL | Status: DC

## 2014-01-14 NOTE — Progress Notes (Signed)
Subjective:    Patient ID: Maria Franklin, female    DOB: 1960-12-20, 53 y.o.   MRN: [id number]  HPI Comments: Patient is a 53 year old female who presents for follow up for a recent ED visit. Patient reports she recently was seen in the ED and then admitted for swelling to her lower jaw. Patient reports swelling was attributed to her Lisinopril and Carvedilol, was instructed to discontinue both for BP and continue with newly prescribed Bystolic along with her amlodipine and HCTZ. Reports she was unable to have Bystolic Rx filled due to insurance denying. Has returned to using her lisinopril and carvedilol without difficulty. Patient would also like complete physical exam while in office today and needs refills for all medications. Denies chest pain/palpitations,  painful/difficulty swallowing, visual change/disturbance, lightheaded, dizzy, numbness or weakness. History of HTN. Hypothyroidism and asthma well controlled on current medications.  Last mammogram 7/14   Review of Systems  Constitutional: Negative for fever, chills, activity change and appetite change.  HENT: Negative for facial swelling, sinus pressure, sore throat and trouble swallowing.   Eyes: Negative for redness and visual disturbance.  Respiratory: Positive for shortness of breath (at baseline). Negative for cough.   Cardiovascular: Negative for chest pain, palpitations and leg swelling.  Gastrointestinal: Negative for nausea, vomiting, diarrhea and constipation.  Genitourinary: Negative for dysuria, vaginal pain and pelvic pain.  Skin: Negative for rash.  Neurological: Negative for dizziness, weakness, light-headedness, numbness and headaches.  All other systems reviewed and are negative.       Past Medical History  Diagnosis Date  . Asthma   . Chicken pox   . Allergy   . Hypertension   . Hyperlipidemia   . Thyroid disease     Objective:   Physical Exam  Vitals reviewed. Constitutional: She is oriented to  person, place, and time. She appears well-developed and well-nourished. No distress.  HENT:  Head: Normocephalic and atraumatic.  Right Ear: Hearing, tympanic membrane, external ear and ear canal normal.  Left Ear: Hearing, tympanic membrane, external ear and ear canal normal.  Nose: Nose normal.  Mouth/Throat: Uvula is midline, oropharynx is clear and moist and mucous membranes are normal.  Eyes: Conjunctivae and EOM are normal. Pupils are equal, round, and reactive to light.  Neck: Trachea normal and normal range of motion. Carotid bruit is not present. No mass present.  Cardiovascular: Normal rate, regular rhythm, normal heart sounds, intact distal pulses and normal pulses.  Exam reveals no gallop and no friction rub.   No murmur heard. Pulses:      Radial pulses are 2+ on the left side.       Dorsalis pedis pulses are 2+ on the right side, and 2+ on the left side.  Pulmonary/Chest: Effort normal. No respiratory distress. She has decreased breath sounds. She has no wheezes. She has no rhonchi. She has no rales.  Abdominal: Soft. Normal appearance and bowel sounds are normal. There is no tenderness.  Genitourinary: Uterus normal. Rectal exam shows no external hemorrhoid, no fissure and no tenderness. No breast swelling, tenderness, discharge or bleeding. Pelvic exam was performed with patient supine. There is no tenderness on the right labia. There is no tenderness on the left labia. Cervix exhibits no motion tenderness, no discharge and no friability. Right adnexum displays no mass and no tenderness. Left adnexum displays no mass and no tenderness. No tenderness or bleeding around the vagina. No signs of injury around the vagina. No vaginal discharge found.  Exam chaperoned by CMA, Maria Franklin  Musculoskeletal:  FROM U/LE bilateral  Lymphadenopathy:    She has no cervical adenopathy.    She has no axillary adenopathy.       Right: No inguinal adenopathy present.       Left: No inguinal  adenopathy present.  Neurological: She is alert and oriented to person, place, and time. She has normal strength. No cranial nerve deficit. Coordination and gait normal. GCS eye subscore is 4. GCS verbal subscore is 5. GCS motor subscore is 6.  Skin: Skin is warm and dry.  Psychiatric: She has a normal mood and affect. Her speech is normal and behavior is normal.   Filed Vitals:   01/14/14 1024  BP: 140/96  Pulse: 89  Temp: 98.5 F (36.9 C)   Lab Results  Component Value Date   WBC 13.1* 01/01/2014   HGB 10.9* 01/01/2014   HCT 32.0* 01/01/2014   PLT 276 01/01/2014   GLUCOSE 203* 01/01/2014   CHOL 133 07/13/2013   TRIG 104.0 07/13/2013   HDL 56.80 07/13/2013   LDLDIRECT 124.1 01/10/2013   LDLCALC 55 07/13/2013   ALT 24 12/25/2013   AST 20 12/25/2013   NA 138 01/01/2014   K 3.5* 01/01/2014   CL 100 01/01/2014   CREATININE 1.05 01/01/2014   BUN 17 01/01/2014   CO2 23 01/01/2014   TSH 2.68 01/10/2013   HGBA1C 6.2 01/10/2013     ECG dated 01/03/14 Sinus rhythm with occasional premature ventricular complexes Rate 86 bpm    Assessment & Plan:    CPX/v70.0 - Patient has been counseled on age-appropriate routine health concerns for screening and prevention. These are reviewed and up-to-date. Immunizations are up-to-date or declined. Labs ordered and will be reviewed.   ECG reviewed   HTN: Patient reports doing well with restarting Lisinopril and carvedilol, will continue use of along with amlodipine and HCTZ. Refill today.  HLD Controlled with Lipitor Will do labs today  Elevated blood glucose HgbA1C today  Asthma Chronic, well controlled with advair and albuterol. Refills today.

## 2014-01-14 NOTE — Progress Notes (Signed)
   Subjective:    Patient ID: Maria Franklin, female    DOB: [birth year], 53 y.o.   MRN: [id number]  HPI    Review of Systems     Objective:   Physical Exam   Lab Results  Component Value Date   WBC 13.1* 01/01/2014   HGB 10.9* 01/01/2014   HCT 32.0* 01/01/2014   PLT 276 01/01/2014   GLUCOSE 203* 01/01/2014   CHOL 133 07/13/2013   TRIG 104.0 07/13/2013   HDL 56.80 07/13/2013   LDLDIRECT 124.1 01/10/2013   LDLCALC 55 07/13/2013   ALT 24 12/25/2013   AST 20 12/25/2013   NA 138 01/01/2014   K 3.5* 01/01/2014   CL 100 01/01/2014   CREATININE 1.05 01/01/2014   BUN 17 01/01/2014   CO2 23 01/01/2014   TSH 2.68 01/10/2013   HGBA1C 6.2 01/10/2013       Assessment & Plan:

## 2014-01-14 NOTE — Patient Instructions (Addendum)
It was great to meet you today [name]!   Labs have been ordered for you, when you report to lab please be fasting.      Hypertension Hypertension is another name for high blood pressure. High blood pressure may mean that your heart needs to work harder to pump blood. Blood pressure consists of two numbers, which includes a higher number over a lower number (example: 110/72). HOME CARE   Make lifestyle changes as told by your doctor. This may include weight loss and exercise.  Take your blood pressure medicine every day.  Limit how much salt you use.  Stop smoking if you smoke.  Do not use drugs.  Talk to your doctor if you are using decongestants or birth control pills. These medicines might make blood pressure higher.  Females should not drink more than 1 alcoholic drink per day. Males should not drink more than 2 alcoholic drinks per day.  See your doctor as told. GET HELP RIGHT AWAY IF:   You have a blood pressure reading with a top number of 180 or higher.  You get a very bad headache.  You get blurred or changing vision.  You feel confused.  You feel weak, numb, or faint.  You get chest or belly (abdominal) pain.  You throw up (vomit).  You cannot breathe very well. MAKE SURE YOU:   Understand these instructions.  Will watch your condition.  Will get help right away if you are not doing well or get worse. Document Released: 05/10/2008 Document Revised: 02/14/2012 Document Reviewed: 05/10/2008 [facility] Patient Information 2014 [place], [place]. Health Maintenance, Female A healthy lifestyle and preventative care can promote health and wellness.  Maintain regular health, dental, and eye exams.  Eat a healthy diet. Foods like vegetables, fruits, whole grains, low-fat dairy products, and lean protein foods contain the nutrients you need without too many calories. Decrease your intake of foods high in solid fats, added sugars, and salt. Get information  about a proper diet from your caregiver, if necessary.  Regular physical exercise is one of the most important things you can do for your health. Most adults should get at least 150 minutes of moderate-intensity exercise (any activity that increases your heart rate and causes you to sweat) each week. In addition, most adults need muscle-strengthening exercises on 2 or more days a week.   Maintain a healthy weight. The body mass index (BMI) is a screening tool to identify possible weight problems. It provides an estimate of body fat based on height and weight. Your caregiver can help determine your BMI, and can help you achieve or maintain a healthy weight. For adults 20 years and older:  A BMI below 18.5 is considered underweight.  A BMI of 18.5 to 24.9 is normal.  A BMI of 25 to 29.9 is considered overweight.  A BMI of 30 and above is considered obese.  Maintain normal blood lipids and cholesterol by exercising and minimizing your intake of saturated fat. Eat a balanced diet with plenty of fruits and vegetables. Blood tests for lipids and cholesterol should begin at age 54 and be repeated every 5 years. If your lipid or cholesterol levels are high, you are over 50, or you are a high risk for heart disease, you may need your cholesterol levels checked more frequently.Ongoing high lipid and cholesterol levels should be treated with medicines if diet and exercise are not effective.  If you smoke, find out from your caregiver how to quit. If you  do not use tobacco, do not start.  Lung cancer screening is recommended for adults aged 63 80 years who are at high risk for developing lung cancer because of a history of smoking. Yearly low-dose computed tomography (CT) is recommended for people who have at least a 30-pack-year history of smoking and are a current smoker or have quit within the past 15 years. A pack year of smoking is smoking an average of 1 pack of cigarettes a day for 1 year (for  example: 1 pack a day for 30 years or 2 packs a day for 15 years). Yearly screening should continue until the smoker has stopped smoking for at least 15 years. Yearly screening should also be stopped for people who develop a health problem that would prevent them from having lung cancer treatment.  If you are pregnant, do not drink alcohol. If you are breastfeeding, be very cautious about drinking alcohol. If you are not pregnant and choose to drink alcohol, do not exceed 1 drink per day. One drink is considered to be 12 ounces (355 mL) of beer, 5 ounces (148 mL) of wine, or 1.5 ounces (44 mL) of liquor.  Avoid use of street drugs. Do not share needles with anyone. Ask for help if you need support or instructions about stopping the use of drugs.  High blood pressure causes heart disease and increases the risk of stroke. Blood pressure should be checked at least every 1 to 2 years. Ongoing high blood pressure should be treated with medicines, if weight loss and exercise are not effective.  If you are 36 to 53 years old, ask your caregiver if you should take aspirin to prevent strokes.  Diabetes screening involves taking a blood sample to check your fasting blood sugar level. This should be done once every 3 years, after age 73, if you are within normal weight and without risk factors for diabetes. Testing should be considered at a younger age or be carried out more frequently if you are overweight and have at least 1 risk factor for diabetes.  Breast cancer screening is essential preventative care for women. You should practice "breast self-awareness." This means understanding the normal appearance and feel of your breasts and may include breast self-examination. Any changes detected, no matter how small, should be reported to a caregiver. Women in their 80s and 30s should have a clinical breast exam (CBE) by a caregiver as part of a regular health exam every 1 to 3 years. After age 44, women should have  a CBE every year. Starting at age 9, women should consider having a mammogram (breast X-ray) every year. Women who have a family history of breast cancer should talk to their caregiver about genetic screening. Women at a high risk of breast cancer should talk to their caregiver about having an MRI and a mammogram every year.  Breast cancer gene (BRCA)-related cancer risk assessment is recommended for women who have family members with BRCA-related cancers. BRCA-related cancers include breast, ovarian, tubal, and peritoneal cancers. Having family members with these cancers may be associated with an increased risk for harmful changes (mutations) in the breast cancer genes BRCA1 and BRCA2. Results of the assessment will determine the need for genetic counseling and BRCA1 and BRCA2 testing.  The Pap test is a screening test for cervical cancer. Women should have a Pap test starting at age 12. Between ages 50 and 52, Pap tests should be repeated every 2 years. Beginning at age 15, you should have  a Pap test every 3 years as long as the past 3 Pap tests have been normal. If you had a hysterectomy for a problem that was not cancer or a condition that could lead to cancer, then you no longer need Pap tests. If you are between ages 16 and 61, and you have had normal Pap tests going back 10 years, you no longer need Pap tests. If you have had past treatment for cervical cancer or a condition that could lead to cancer, you need Pap tests and screening for cancer for at least 20 years after your treatment. If Pap tests have been discontinued, risk factors (such as a new sexual partner) need to be reassessed to determine if screening should be resumed. Some women have medical problems that increase the chance of getting cervical cancer. In these cases, your caregiver may recommend more frequent screening and Pap tests.  The human papillomavirus (HPV) test is an additional test that may be used for cervical cancer  screening. The HPV test looks for the virus that can cause the cell changes on the cervix. The cells collected during the Pap test can be tested for HPV. The HPV test could be used to screen women aged 43 years and older, and should be used in women of any age who have unclear Pap test results. After the age of 50, women should have HPV testing at the same frequency as a Pap test.  Colorectal cancer can be detected and often prevented. Most routine colorectal cancer screening begins at the age of 62 and continues through age 9. However, your caregiver may recommend screening at an earlier age if you have risk factors for colon cancer. On a yearly basis, your caregiver may provide home test kits to check for hidden blood in the stool. Use of a small camera at the end of a tube, to directly examine the colon (sigmoidoscopy or colonoscopy), can detect the earliest forms of colorectal cancer. Talk to your caregiver about this at age 35, when routine screening begins. Direct examination of the colon should be repeated every 5 to 10 years through age 19, unless early forms of pre-cancerous polyps or small growths are found.  Hepatitis C blood testing is recommended for all people born from [birth year] through 1965 and any individual with known risks for hepatitis C.  Practice safe sex. Use condoms and avoid high-risk sexual practices to reduce the spread of sexually transmitted infections (STIs). Sexually active women aged 53 and younger should be checked for Chlamydia, which is a common sexually transmitted infection. Older women with new or multiple partners should also be tested for Chlamydia. Testing for other STIs is recommended if you are sexually active and at increased risk.  Osteoporosis is a disease in which the bones lose minerals and strength with aging. This can result in serious bone fractures. The risk of osteoporosis can be identified using a bone density scan. Women ages 80 and over and women at risk  for fractures or osteoporosis should discuss screening with their caregivers. Ask your caregiver whether you should be taking a calcium supplement or vitamin D to reduce the rate of osteoporosis.  Menopause can be associated with physical symptoms and risks. Hormone replacement therapy is available to decrease symptoms and risks. You should talk to your caregiver about whether hormone replacement therapy is right for you.  Use sunscreen. Apply sunscreen liberally and repeatedly throughout the day. You should seek shade when your shadow is shorter than you. Protect yourself  by wearing long sleeves, pants, a wide-brimmed hat, and sunglasses year round, whenever you are outdoors.  Notify your caregiver of new moles or changes in moles, especially if there is a change in shape or color. Also notify your caregiver if a mole is larger than the size of a pencil eraser.  Stay current with your immunizations. Document Released: 06/07/2011 Document Revised: 03/19/2013 Document Reviewed: 06/07/2011 [facility] Patient Information 2014 [place].

## 2014-01-14 NOTE — Progress Notes (Signed)
Pre visit review using our clinic review tool, if applicable. No additional management support is needed unless otherwise documented below in the visit note. 

## 2014-01-15 ENCOUNTER — Encounter: Payer: Self-pay | Admitting: Physician Assistant

## 2014-01-15 ENCOUNTER — Other Ambulatory Visit: Payer: Self-pay | Admitting: Physician Assistant

## 2014-01-15 DIAGNOSIS — D510 Vitamin B12 deficiency anemia due to intrinsic factor deficiency: Secondary | ICD-10-CM

## 2014-01-15 LAB — URINALYSIS, ROUTINE W REFLEX MICROSCOPIC
BILIRUBIN URINE: NEGATIVE
HGB URINE DIPSTICK: NEGATIVE
Ketones, ur: NEGATIVE
NITRITE: NEGATIVE
PH: 5.5 (ref 5.0–8.0)
RBC / HPF: NONE SEEN (ref 0–?)
Specific Gravity, Urine: 1.025 (ref 1.000–1.030)
Total Protein, Urine: NEGATIVE
Urine Glucose: NEGATIVE
Urobilinogen, UA: 0.2 (ref 0.0–1.0)

## 2014-01-18 ENCOUNTER — Encounter: Payer: Self-pay | Admitting: Physician Assistant

## 2014-01-30 ENCOUNTER — Telehealth: Payer: Self-pay | Admitting: *Deleted

## 2014-01-30 MED ORDER — LISINOPRIL 40 MG PO TABS: 40.0000 mg | ORAL_TABLET | Freq: Every day | ORAL | Status: DC

## 2014-01-30 NOTE — Telephone Encounter (Signed)
Please advise? Pt is requesting lisinopril 40 mg 1/daily.

## 2014-02-13 ENCOUNTER — Telehealth: Payer: Self-pay

## 2014-02-13 ENCOUNTER — Other Ambulatory Visit: Payer: Self-pay | Admitting: *Deleted

## 2014-02-13 MED ORDER — CARVEDILOL 12.5 MG PO TABS: 12.5000 mg | ORAL_TABLET | Freq: Two times a day (BID) | ORAL | Status: DC

## 2014-02-13 NOTE — Telephone Encounter (Signed)
Rx sent to pharmacy   

## 2014-02-13 NOTE — Telephone Encounter (Signed)
The patient called and is hoping to get her blood pressure medicine called in.  She states its something like "carvetilol".  I dont see it on her med list.  She is hoping to speak to the [facility] as soon as possible to straighten out her medicine issues.   Callback - [phone]

## 2014-02-13 NOTE — Telephone Encounter (Signed)
Could you please refill the carvedilol for this patient, it should be in her historic medication list. Thanks.

## 2014-03-28 ENCOUNTER — Encounter (HOSPITAL_COMMUNITY): Payer: Self-pay | Admitting: Psychiatry

## 2014-03-28 ENCOUNTER — Ambulatory Visit (INDEPENDENT_AMBULATORY_CARE_PROVIDER_SITE_OTHER): Payer: Medicare Other | Admitting: Psychiatry

## 2014-03-28 VITALS — BP 127/78 | HR 75 | Ht 64.0 in | Wt 234.0 lb

## 2014-03-28 DIAGNOSIS — F200 Paranoid schizophrenia: Secondary | ICD-10-CM

## 2014-03-28 DIAGNOSIS — F411 Generalized anxiety disorder: Secondary | ICD-10-CM | POA: Diagnosis not present

## 2014-03-28 MED ORDER — HALOPERIDOL 2 MG PO TABS: 2.0000 mg | ORAL_TABLET | Freq: Every day | ORAL | Status: DC

## 2014-03-28 NOTE — Progress Notes (Signed)
[facility] [id number] Progress Note  Maria Franklin [id number] y.o.  03/28/2014 9:16 AM  Chief Complaint:  Medication management and followup.      History of Present Illness: Maria Franklin came for her followup appointment.  On her last visit we started Mr. he'll for her anxiety and insomnia the patient was unable to afford because her insurance did not approve.  She did not call us about this information.  She reported her anxiety is much better now.  She sleeping better.  She's not working .  She is looking for a part-time job .  She moved to her new home in February and she liked it.  Her anxiety is less intense and less frequent.  She sleeping better.  Her husband is very supportive.  Patient denies any agitation or any anger.  She reported her paranoia and hallucinations are less intense from the past.  She is still feeling very anxious and nervous around crowds but lately she has no new issues.  She is concerned about her health issues and recently she was seen by multiple providers for her health maintenance.  Her appetite is good.  She sleeping better.  She denies any crying spells or any agitation.  She is compliant with Haldol 2 mg at bedtime.  Patient is not interested in counseling at this time.  Patient lives with her husband who is very supportive.  Suicidal Ideation: No Plan Formed: No Patient has means to carry out plan: No  Homicidal Ideation: No Plan Formed: No Patient has means to carry out plan: No  ROS  Psychiatric: Agitation: Yes Hallucination: No Depressed Mood: Yes Insomnia: Yes Hypersomnia: No Altered Concentration: No Feels Worthless: No Grandiose Ideas: No Belief In Special Powers: No New/Increased Substance Abuse: No Compulsions: No  Neurologic: Headache: No Seizure: No Paresthesias: No  Medical History:  Patient has a hypertension, hyperlipidemia, and glucose intolerance, hypothyroidism and obesity.    Outpatient Encounter Prescriptions as  of 03/28/2014  Medication Sig  . albuterol (PROVENTIL HFA;VENTOLIN HFA) 108 (90 BASE) MCG/ACT inhaler Inhale 1-2 puffs into the lungs every 6 (six) hours as needed for wheezing or shortness of breath.  Marland Kitchen amLODipine (NORVASC) 10 MG tablet Take 1 tablet (10 mg total) by mouth daily.  Marland Kitchen atorvastatin (LIPITOR) 10 MG tablet Take 1 tablet (10 mg total) by mouth daily.  . B Complex-C (B-COMPLEX WITH VITAMIN C) tablet Take 1 tablet by mouth daily.  . carvedilol (COREG) 12.5 MG tablet Take 1 tablet (12.5 mg total) by mouth 2 (two) times daily with a meal.  . cetirizine (ZYRTEC) 10 MG tablet Take 1 tablet (10 mg total) by mouth daily.  . Fluticasone-Salmeterol (ADVAIR) 250-50 MCG/DOSE AEPB Inhale 1 puff into the lungs 2 (two) times daily.  . haloperidol (HALDOL) 2 MG tablet Take 1 tablet (2 mg total) by mouth at bedtime.  Marland Kitchen levothyroxine (SYNTHROID, LEVOTHROID) 50 MCG tablet Take 1 tablet (50 mcg total) by mouth daily before breakfast.  . lisinopril (PRINIVIL,ZESTRIL) 40 MG tablet Take 1 tablet (40 mg total) by mouth daily.  . mometasone (NASONEX) 50 MCG/ACT nasal spray Place 2 sprays into the nose daily.  . Omega-3 Fatty Acids (FISH OIL PO) Take 1 capsule by mouth daily.  . potassium chloride SA (K-DUR,KLOR-CON) 20 MEQ tablet Take 1 tablet (20 mEq total) by mouth 2 (two) times daily.  Marland Kitchen tetrahydrozoline (VISINE) 0.05 % ophthalmic solution Place 2 drops into both eyes.  . [DISCONTINUED] haloperidol (HALDOL) 2 MG tablet Take 1 tablet (  2 mg total) by mouth at bedtime.  . [DISCONTINUED] hydrochlorothiazide (HYDRODIURIL) 25 MG tablet Take 1 tablet (25 mg total) by mouth daily.  . [DISCONTINUED] predniSONE (DELTASONE) 10 MG tablet Take 4 tabs  daily with food x 4 days, then 3 tabs daily x 4 days, then 2 tabs daily x 4 days, then 1 tab daily x4 days then stop. #40    Past Psychiatric History/Hospitalization(s): Patient endorsed history of paranoia or delusion for a long time however she handled her illness very  well until 2006.  She remembered exhibiting paranoia and delusion about her family members.  She felt that people are monitoring her through television.  She became very depressed suicidal and isolated.  She took overdose on her medication and require psychiatric admission at [facility].  She has a history of noncompliance with medication.  After 4 months she was again readmitted due to decompensation.  She was given Abilify which worked very well for her however she stopped taking because of cost. Patient denies any history of physical sexual verbal and emotional abuse. Anxiety: Yes Bipolar Disorder: No Depression: Yes Mania: No Psychosis: No Schizophrenia: Yes Personality Disorder: No Hospitalization for psychiatric illness: Yes History of Electroconvulsive Shock Therapy: No Prior Suicide Attempts: Yes  Physical Exam: Constitutional:  BP 127/78  Pulse 75  Ht $R'5\' 4"'OS$  (1.626 m)  Wt 234 lb (106.142 kg)  BMI 40.15 kg/m2 she was reluctant to get blood pressure taken.  Recent Results (from the past 2160 hour(s))  CBC WITH DIFFERENTIAL     Status: Abnormal   Collection Time    12/30/13 10:56 AM      Result Value Ref Range   WBC 7.8  4.0 - 10.5 K/uL   RBC 4.10  3.87 - 5.11 MIL/uL   Hemoglobin 11.2 (*) 12.0 - 15.0 g/dL   HCT 34.3 (*) 36.0 - 46.0 %   MCV 83.7  78.0 - 100.0 fL   MCH 27.3  26.0 - 34.0 pg   MCHC 32.7  30.0 - 36.0 g/dL   RDW 13.6  11.5 - 15.5 %   Platelets 277  150 - 400 K/uL   Neutrophils Relative % 62  43 - 77 %   Neutro Abs 4.8  1.7 - 7.7 K/uL   Lymphocytes Relative 26  12 - 46 %   Lymphs Abs 2.1  0.7 - 4.0 K/uL   Monocytes Relative 11  3 - 12 %   Monocytes Absolute 0.9  0.1 - 1.0 K/uL   Eosinophils Relative 0  0 - 5 %   Eosinophils Absolute 0.0  0.0 - 0.7 K/uL   Basophils Relative 0  0 - 1 %   Basophils Absolute 0.0  0.0 - 0.1 K/uL  BASIC METABOLIC PANEL     Status: Abnormal   Collection Time    12/30/13 10:56 AM      Result Value Ref Range   Sodium 140   137 - 147 mEq/L   Potassium 3.3 (*) 3.7 - 5.3 mEq/L   Chloride 98  96 - 112 mEq/L   CO2 28  19 - 32 mEq/L   Glucose, Bld 87  70 - 99 mg/dL   BUN 14  6 - 23 mg/dL   Creatinine, Ser 1.09  0.50 - 1.10 mg/dL   Calcium 9.2  8.4 - 10.5 mg/dL   GFR calc non Af Amer 57 (*) >90 mL/min   GFR calc Af Amer 66 (*) >90 mL/min   Comment: (NOTE)  The eGFR has been calculated using the CKD EPI equation.     This calculation has not been validated in all clinical situations.     eGFR's persistently <90 mL/min signify possible Chronic Kidney     Disease.  CULTURE, BLOOD (ROUTINE X 2)     Status: None   Collection Time    12/30/13  3:36 PM      Result Value Ref Range   Specimen Description BLOOD LEFT HAND     Special Requests BOTTLES DRAWN AEROBIC AND ANAEROBIC 3.5CC EACH     Culture  Setup Time       Value: 12/30/2013 19:55     Performed at [facility]   Culture       Value: NO GROWTH 5 DAYS     Performed at [facility]   Report Status 01/05/2014 FINAL    LACTIC ACID, PLASMA     Status: None   Collection Time    12/30/13  3:36 PM      Result Value Ref Range   Lactic Acid, Venous 1.4  0.5 - 2.2 mmol/L  CULTURE, BLOOD (ROUTINE X 2)     Status: None   Collection Time    12/30/13  3:43 PM      Result Value Ref Range   Specimen Description BLOOD RIGHT ANTECUBITAL     Special Requests BOTTLES DRAWN AEROBIC AND ANAEROBIC 8 CC EACH     Culture  Setup Time       Value: 12/30/2013 19:55     Performed at [facility]   Culture       Value: NO GROWTH 5 DAYS     Performed at [facility]   Report Status 01/05/2014 FINAL    MAGNESIUM     Status: None   Collection Time    12/30/13  3:43 PM      Result Value Ref Range   Magnesium 2.1  1.5 - 2.5 mg/dL  PHOSPHORUS     Status: None   Collection Time    12/30/13  3:43 PM      Result Value Ref Range   Phosphorus 3.0  2.3 - 4.6 mg/dL  TROPONIN I     Status: None   Collection Time    12/30/13  3:43 PM       Result Value Ref Range   Troponin I <0.30  <0.30 ng/mL   Comment:            Due to the release kinetics of cTnI,     a negative result within the first hours     of the onset of symptoms does not rule out     myocardial infarction with certainty.     If myocardial infarction is still suspected,     repeat the test at appropriate intervals.  PROCALCITONIN     Status: None   Collection Time    12/30/13  3:43 PM      Result Value Ref Range   Procalcitonin <0.10     Comment:            Interpretation:     PCT (Procalcitonin) <= 0.5 ng/mL:     Systemic infection (sepsis) is not likely.     Local bacterial infection is possible.     (NOTE)             ICU PCT Algorithm               Non ICU PCT  Algorithm        ----------------------------     ------------------------------             PCT < 0.25 ng/mL                 PCT < 0.1 ng/mL         Stopping of antibiotics            Stopping of antibiotics           strongly encouraged.               strongly encouraged.        ----------------------------     ------------------------------           PCT level decrease by               PCT < 0.25 ng/mL           >= 80% from peak PCT           OR PCT 0.25 - 0.5 ng/mL          Stopping of antibiotics                                                 encouraged.         Stopping of antibiotics               encouraged.        ----------------------------     ------------------------------           PCT level decrease by              PCT >= 0.25 ng/mL           < 80% from peak PCT            AND PCT >= 0.5 ng/mL            Continuing antibiotics                                                  encouraged.           Continuing antibiotics                encouraged.        ----------------------------     ------------------------------         PCT level increase compared          PCT > 0.5 ng/mL             with peak PCT AND              PCT >= 0.5 ng/mL             Escalation of antibiotics                                               strongly encouraged.          Escalation of antibiotics            strongly encouraged.  PRO B NATRIURETIC PEPTIDE  Status: None   Collection Time    12/30/13  3:45 PM      Result Value Ref Range   Pro B Natriuretic peptide (BNP) 63.5  0 - 125 pg/mL  RESPIRATORY VIRUS PANEL     Status: Abnormal   Collection Time    12/30/13  4:40 PM      Result Value Ref Range   Source - RVPAN NOSE     Respiratory Syncytial Virus A NOT DETECTED     Respiratory Syncytial Virus B NOT DETECTED     Influenza A DETECTED (*)    Influenza B NOT DETECTED     Parainfluenza 1 NOT DETECTED     Parainfluenza 2 NOT DETECTED     Parainfluenza 3 NOT DETECTED     Metapneumovirus NOT DETECTED     Rhinovirus NOT DETECTED     Adenovirus NOT DETECTED     Influenza A H1 NOT DETECTED     Influenza A H3 DETECTED (*)    Comment: (NOTE)           Normal Reference Range for each Analyte: NOT DETECTED     Testing performed using the Luminex xTAG Respiratory Viral Panel test     kit.     This test was developed and its performance characteristics determined     by [facility]. It has not been cleared or approved by the [place]     Food and Drug Administration. This test is used for clinical purposes.     It should not be regarded as investigational or for research. This     laboratory is certified under the [employer] (CLIA) as qualified to perform high complexity     clinical laboratory testing.     Performed at [facility] I-STAT TROPONIN I     Status: None   Collection Time    12/30/13  8:01 PM      Result Value Ref Range   Troponin i, poc 0.01  0.00 - 0.08 ng/mL   Comment 3            Comment: Due to the release kinetics of cTnI,     a negative result within the first hours     of the onset of symptoms does not rule out     myocardial infarction with certainty.     If myocardial infarction is still  suspected,     repeat the test at appropriate intervals.  MRSA PCR SCREENING     Status: None   Collection Time    12/30/13  8:36 PM      Result Value Ref Range   MRSA by PCR NEGATIVE  NEGATIVE   Comment:            The GeneXpert MRSA Assay (FDA     approved for NASAL specimens     only), is one component of a     comprehensive MRSA colonization     surveillance program. It is not     intended to diagnose MRSA     infection nor to guide or     monitor treatment for     MRSA infections.  TROPONIN I     Status: None   Collection Time    12/31/13  2:15 AM      Result Value Ref Range   Troponin I <0.30  <0.30 ng/mL   Comment:  Due to the release kinetics of cTnI,     a negative result within the first hours     of the onset of symptoms does not rule out     myocardial infarction with certainty.     If myocardial infarction is still suspected,     repeat the test at appropriate intervals.  CBC     Status: Abnormal   Collection Time    12/31/13  2:15 AM      Result Value Ref Range   WBC 6.2  4.0 - 10.5 K/uL   RBC 4.03  3.87 - 5.11 MIL/uL   Hemoglobin 11.0 (*) 12.0 - 15.0 g/dL   HCT 33.9 (*) 36.0 - 46.0 %   MCV 84.1  78.0 - 100.0 fL   MCH 27.3  26.0 - 34.0 pg   MCHC 32.4  30.0 - 36.0 g/dL   RDW 13.4  11.5 - 15.5 %   Platelets 264  150 - 400 K/uL  BASIC METABOLIC PANEL     Status: Abnormal   Collection Time    12/31/13  2:15 AM      Result Value Ref Range   Sodium 138  137 - 147 mEq/L   Potassium 3.5 (*) 3.7 - 5.3 mEq/L   Chloride 97  96 - 112 mEq/L   CO2 26  19 - 32 mEq/L   Glucose, Bld 207 (*) 70 - 99 mg/dL   BUN 13  6 - 23 mg/dL   Creatinine, Ser 1.07  0.50 - 1.10 mg/dL   Calcium 8.3 (*) 8.4 - 10.5 mg/dL   GFR calc non Af Amer 59 (*) >90 mL/min   GFR calc Af Amer 68 (*) >90 mL/min   Comment: (NOTE)     The eGFR has been calculated using the CKD EPI equation.     This calculation has not been validated in all clinical situations.     eGFR's persistently  <90 mL/min signify possible Chronic Kidney     Disease.  MAGNESIUM     Status: None   Collection Time    12/31/13  2:15 AM      Result Value Ref Range   Magnesium 2.1  1.5 - 2.5 mg/dL  PHOSPHORUS     Status: None   Collection Time    12/31/13  2:15 AM      Result Value Ref Range   Phosphorus 3.6  2.3 - 4.6 mg/dL  TROPONIN I     Status: None   Collection Time    12/31/13  7:40 AM      Result Value Ref Range   Troponin I <0.30  <0.30 ng/mL   Comment:            Due to the release kinetics of cTnI,     a negative result within the first hours     of the onset of symptoms does not rule out     myocardial infarction with certainty.     If myocardial infarction is still suspected,     repeat the test at appropriate intervals.  CBC     Status: Abnormal   Collection Time    01/01/14  3:30 AM      Result Value Ref Range   WBC 13.1 (*) 4.0 - 10.5 K/uL   RBC 3.88  3.87 - 5.11 MIL/uL   Hemoglobin 10.9 (*) 12.0 - 15.0 g/dL   HCT 32.0 (*) 36.0 - 46.0 %   MCV 82.5  78.0 - 100.0 fL  MCH 28.1  26.0 - 34.0 pg   MCHC 34.1  30.0 - 36.0 g/dL   RDW 13.5  11.5 - 15.5 %   Platelets 276  150 - 400 K/uL  BASIC METABOLIC PANEL     Status: Abnormal   Collection Time    01/01/14  3:30 AM      Result Value Ref Range   Sodium 138  137 - 147 mEq/L   Potassium 3.5 (*) 3.7 - 5.3 mEq/L   Chloride 100  96 - 112 mEq/L   CO2 23  19 - 32 mEq/L   Glucose, Bld 203 (*) 70 - 99 mg/dL   BUN 17  6 - 23 mg/dL   Creatinine, Ser 1.05  0.50 - 1.10 mg/dL   Calcium 8.0 (*) 8.4 - 10.5 mg/dL   GFR calc non Af Amer 60 (*) >90 mL/min   GFR calc Af Amer 70 (*) >90 mL/min   Comment: (NOTE)     The eGFR has been calculated using the CKD EPI equation.     This calculation has not been validated in all clinical situations.     eGFR's persistently <90 mL/min signify possible Chronic Kidney     Disease.  TROPONIN I     Status: None   Collection Time    01/01/14 12:40 PM      Result Value Ref Range   Troponin I <0.30   <0.30 ng/mL   Comment:            Due to the release kinetics of cTnI,     a negative result within the first hours     of the onset of symptoms does not rule out     myocardial infarction with certainty.     If myocardial infarction is still suspected,     repeat the test at appropriate intervals.  TROPONIN I     Status: None   Collection Time    01/01/14  7:54 PM      Result Value Ref Range   Troponin I <0.30  <0.30 ng/mL   Comment:            Due to the release kinetics of cTnI,     a negative result within the first hours     of the onset of symptoms does not rule out     myocardial infarction with certainty.     If myocardial infarction is still suspected,     repeat the test at appropriate intervals.  TROPONIN I     Status: None   Collection Time    01/02/14  2:05 AM      Result Value Ref Range   Troponin I <0.30  <0.30 ng/mL   Comment:            Due to the release kinetics of cTnI,     a negative result within the first hours     of the onset of symptoms does not rule out     myocardial infarction with certainty.     If myocardial infarction is still suspected,     repeat the test at appropriate intervals.  TSH     Status: None   Collection Time    01/14/14 12:24 PM      Result Value Ref Range   TSH 0.96  0.35 - 5.50 uIU/mL  HEMOGLOBIN A1C     Status: Abnormal   Collection Time    01/14/14 12:24 PM      Result Value  Ref Range   Hemoglobin A1C 6.9 (*) 4.6 - 6.5 %   Comment: Glycemic Control Guidelines for People with Diabetes:Non Diabetic:  <6%Goal of Therapy: <7%Additional Action Suggested:  >8%   CBC WITH DIFFERENTIAL     Status: Abnormal   Collection Time    01/14/14 12:24 PM      Result Value Ref Range   WBC 13.4 (*) 4.5 - 10.5 K/uL   RBC 4.05  3.87 - 5.11 Mil/uL   Hemoglobin 11.4 (*) 12.0 - 15.0 g/dL   HCT 35.2 (*) 36.0 - 46.0 %   MCV 86.9  78.0 - 100.0 fl   MCHC 32.5  30.0 - 36.0 g/dL   RDW 14.9 (*) 11.5 - 14.6 %   Platelets 227.0  150.0 - 400.0 K/uL    Neutrophils Relative % 84.2 (*) 43.0 - 77.0 %   Lymphocytes Relative 12.9  12.0 - 46.0 %   Monocytes Relative 2.3 (*) 3.0 - 12.0 %   Eosinophils Relative 0.5  0.0 - 5.0 %   Basophils Relative 0.1  0.0 - 3.0 %   Neutro Abs 11.3 (*) 1.4 - 7.7 K/uL   Lymphs Abs 1.7  0.7 - 4.0 K/uL   Monocytes Absolute 0.3  0.1 - 1.0 K/uL   Eosinophils Absolute 0.1  0.0 - 0.7 K/uL   Basophils Absolute 0.0  0.0 - 0.1 K/uL  URINALYSIS, ROUTINE W REFLEX MICROSCOPIC     Status: Abnormal   Collection Time    01/14/14 12:24 PM      Result Value Ref Range   Color, Urine YELLOW  Yellow;Lt. Yellow   APPearance CLEAR  Clear   Specific Gravity, Urine 1.025  1.000-1.030   pH 5.5  5.0 - 8.0   Total Protein, Urine NEGATIVE  Negative   Urine Glucose NEGATIVE  Negative   Ketones, ur NEGATIVE  Negative   Bilirubin Urine NEGATIVE  Negative   Hgb urine dipstick NEGATIVE  Negative   Urobilinogen, UA 0.2  0.0 - 1.0   Leukocytes, UA TRACE (*) Negative   Nitrite NEGATIVE  Negative   WBC, UA 0-2/hpf  0-2/hpf   RBC / HPF none seen  0-2/hpf   Ca Oxalate Crys, UA Presence of (*) None  COMPREHENSIVE METABOLIC PANEL     Status: Abnormal   Collection Time    01/14/14 12:24 PM      Result Value Ref Range   Sodium 137  135 - 145 mEq/L   Potassium 3.4 (*) 3.5 - 5.1 mEq/L   Chloride 100  96 - 112 mEq/L   CO2 27  19 - 32 mEq/L   Glucose, Bld 125 (*) 70 - 99 mg/dL   BUN 16  6 - 23 mg/dL   Creatinine, Ser 1.1  0.4 - 1.2 mg/dL   Total Bilirubin 0.6  0.3 - 1.2 mg/dL   Alkaline Phosphatase 51  39 - 117 U/L   AST 31  0 - 37 U/L   ALT 63 (*) 0 - 35 U/L   Total Protein 7.3  6.0 - 8.3 g/dL   Albumin 3.7  3.5 - 5.2 g/dL   Calcium 9.0  8.4 - 10.5 mg/dL   GFR 66.92  >60.00 mL/min  LIPID PANEL     Status: Abnormal   Collection Time    01/14/14 12:24 PM      Result Value Ref Range   Cholesterol 231 (*) 0 - 200 mg/dL   Comment: ATP III Classification       Desirable:  <  200 mg/dL               Borderline High:  200 - 239 mg/dL           High:  > = 240 mg/dL   Triglycerides 79.0  0.0 - 149.0 mg/dL   Comment: Normal:  <150 mg/dLBorderline High:  150 - 199 mg/dL   HDL 113.20  >39.00 mg/dL   VLDL 15.8  0.0 - 40.0 mg/dL   Total CHOL/HDL Ratio 2     Comment:                Men          Women1/2 Average Risk     3.4          3.3Average Risk          5.0          4.42X Average Risk          9.6          7.13X Average Risk          15.0          11.0                      LDL CHOLESTEROL, DIRECT     Status: None   Collection Time    01/14/14 12:24 PM      Result Value Ref Range   Direct LDL 97.1     Comment: Optimal:  <100 mg/dLNear or Above Optimal:  100-129 mg/dLBorderline High:  130-159 mg/dLHigh:  160-189 mg/dLVery High:  >190 mg/dL   General Appearance: well nourished, obese and Maintained fair eye contact  Musculoskeletal: Strength & Muscle Tone: within normal limits Gait & Station: normal Patient leans: N/A  Psychiatric: Speech (describe rate, volume, coherence, spontaneity, and abnormalities if any): Fasting but clear and coherent.  Normal tone and volume.  Thought Process (describe rate, content, abstract reasoning, and computation): Logical and goal-directed.      Associations: Relevant and Intact  Thoughts: normal and Feeling overwhelmed.  Mental Status: Orientation: oriented to person, place, time/date, situation, day of week and month of year Mood & Affect: depressed affect and anxiety Attention Span & Concentration: Fair  Established Problem, Stable/Improving (1), Review of Psycho-Social Stressors (1), Review of Last Therapy Session (1) and Review of Medication Regimen & Side Effects (2)  Assessment: Axis I: Schizophrenia chronic paranoid type, anxiety disorder  Axis II: Deferred  Axis III: See medical history  Axis IV: Mild  Axis V: 60-65   Plan:  Patient is doing better on current dose of Haldol.  I will discontinue Vistaril since patient has not tried because of the cost .  She does not  see that she require any more medication for anxiety and insomnia.  She is sleeping better.  She does not have any tremors or shakes.  I would continue Haldol 2 mg at bedtime.  Recommended to call us back if she has any question or any concern.  Followup in 3 months.  Wilbert Hayashi T., MD 03/28/2014

## 2014-04-19 ENCOUNTER — Encounter: Payer: Self-pay | Admitting: Internal Medicine

## 2014-04-19 ENCOUNTER — Telehealth: Payer: Self-pay | Admitting: Internal Medicine

## 2014-04-19 ENCOUNTER — Ambulatory Visit (INDEPENDENT_AMBULATORY_CARE_PROVIDER_SITE_OTHER): Payer: Medicare Other | Admitting: Internal Medicine

## 2014-04-19 VITALS — BP 130/84 | HR 72 | Temp 98.1°F | Wt 239.2 lb

## 2014-04-19 DIAGNOSIS — E039 Hypothyroidism, unspecified: Secondary | ICD-10-CM | POA: Diagnosis not present

## 2014-04-19 DIAGNOSIS — E119 Type 2 diabetes mellitus without complications: Secondary | ICD-10-CM

## 2014-04-19 DIAGNOSIS — N1831 Chronic kidney disease, stage 3a: Secondary | ICD-10-CM | POA: Insufficient documentation

## 2014-04-19 DIAGNOSIS — E785 Hyperlipidemia, unspecified: Secondary | ICD-10-CM

## 2014-04-19 DIAGNOSIS — F200 Paranoid schizophrenia: Secondary | ICD-10-CM | POA: Insufficient documentation

## 2014-04-19 DIAGNOSIS — J309 Allergic rhinitis, unspecified: Secondary | ICD-10-CM

## 2014-04-19 DIAGNOSIS — E669 Obesity, unspecified: Secondary | ICD-10-CM

## 2014-04-19 DIAGNOSIS — E1122 Type 2 diabetes mellitus with diabetic chronic kidney disease: Secondary | ICD-10-CM | POA: Insufficient documentation

## 2014-04-19 DIAGNOSIS — I10 Essential (primary) hypertension: Secondary | ICD-10-CM

## 2014-04-19 DIAGNOSIS — J302 Other seasonal allergic rhinitis: Secondary | ICD-10-CM | POA: Insufficient documentation

## 2014-04-19 LAB — HEMOGLOBIN A1C: Hgb A1c MFr Bld: 6.5 % (ref 4.6–6.5)

## 2014-04-19 MED ORDER — AMLODIPINE BESYLATE 10 MG PO TABS: 10.0000 mg | ORAL_TABLET | Freq: Every day | ORAL | Status: DC

## 2014-04-19 MED ORDER — CETIRIZINE HCL 10 MG PO TABS: 10.0000 mg | ORAL_TABLET | Freq: Every day | ORAL | Status: DC

## 2014-04-19 MED ORDER — LEVOTHYROXINE SODIUM 50 MCG PO TABS
50.0000 ug | ORAL_TABLET | Freq: Every day | ORAL | Status: DC
Start: 2014-04-19 — End: 2014-10-25

## 2014-04-19 MED ORDER — FLUTICASONE PROPIONATE 50 MCG/ACT NA SUSP: 2.0000 | Freq: Every day | NASAL | Status: DC

## 2014-04-19 MED ORDER — ATORVASTATIN CALCIUM 10 MG PO TABS: 10.0000 mg | ORAL_TABLET | Freq: Every day | ORAL | Status: DC

## 2014-04-19 MED ORDER — HYDROCHLOROTHIAZIDE 25 MG PO TABS: 25.0000 mg | ORAL_TABLET | Freq: Every day | ORAL | Status: DC

## 2014-04-19 NOTE — Assessment & Plan Note (Signed)
Well controlled Continue current therapy at this time Meds refilled today

## 2014-04-19 NOTE — Assessment & Plan Note (Signed)
Discussed diet and exercise She knows what to do, just seems unmotivated

## 2014-04-19 NOTE — Assessment & Plan Note (Signed)
Stable on current dose of synthrroid Medication refilled today

## 2014-04-19 NOTE — Patient Instructions (Addendum)
Type 2 Diabetes Mellitus, Adult Type 2 diabetes mellitus, often simply referred to as type 2 diabetes, is a long-lasting (chronic) disease. In type 2 diabetes, the pancreas does not make enough insulin (a hormone), the cells are less responsive to the insulin that is made (insulin resistance), or both. Normally, insulin moves sugars from food into the tissue cells. The tissue cells use the sugars for energy. The lack of insulin or the lack of normal response to insulin causes excess sugars to build up in the blood instead of going into the tissue cells. As a result, high blood sugar (hyperglycemia) develops. The effect of high sugar (glucose) levels can cause many complications. Type 2 diabetes was also previously called adult-onset diabetes but it can occur at any age.  RISK FACTORS  A person is predisposed to developing type 2 diabetes if someone in the family has the disease and also has one or more of the following primary risk factors:  Overweight.  An inactive lifestyle.  A history of consistently eating high-calorie foods. Maintaining a normal weight and regular physical activity can reduce the chance of developing type 2 diabetes. SYMPTOMS  A person with type 2 diabetes may not show symptoms initially. The symptoms of type 2 diabetes appear slowly. The symptoms include:  Increased thirst (polydipsia).  Increased urination (polyuria).  Increased urination during the night (nocturia).  Weight loss. This weight loss may be rapid.  Frequent, recurring infections.  Tiredness (fatigue).  Weakness.  Vision changes, such as blurred vision.  Fruity smell to your breath.  Abdominal pain.  Nausea or vomiting.  Cuts or bruises which are slow to heal.  Tingling or numbness in the hands or feet. DIAGNOSIS Type 2 diabetes is frequently not diagnosed until complications of diabetes are present. Type 2 diabetes is diagnosed when symptoms or complications are present and when blood  glucose levels are increased. Your blood glucose level may be checked by one or more of the following blood tests:  A fasting blood glucose test. You will not be allowed to eat for at least 8 hours before a blood sample is taken.  A random blood glucose test. Your blood glucose is checked at any time of the day regardless of when you ate.  A hemoglobin A1c blood glucose test. A hemoglobin A1c test provides information about blood glucose control over the previous 3 months.  An oral glucose tolerance test (OGTT). Your blood glucose is measured after you have not eaten (fasted) for 2 hours and then after you drink a glucose-containing beverage. TREATMENT   You may need to take insulin or diabetes medicine daily to keep blood glucose levels in the desired range.  You will need to match insulin dosing with exercise and healthy food choices. The treatment goal is to maintain the before meal blood sugar (preprandial glucose) level at 70 130 mg/dL. HOME CARE INSTRUCTIONS   Have your hemoglobin A1c level checked twice a year.  Perform daily blood glucose monitoring as directed by your caregiver.  Monitor urine ketones when you are ill and as directed by your caregiver.  Take your diabetes medicine or insulin as directed by your caregiver to maintain your blood glucose levels in the desired range.  Never run out of diabetes medicine or insulin. It is needed every day.  Adjust insulin based on your intake of carbohydrates. Carbohydrates can raise blood glucose levels but need to be included in your diet. Carbohydrates provide vitamins, minerals, and fiber which are an essential part of   a healthy diet. Carbohydrates are found in fruits, vegetables, whole grains, dairy products, legumes, and foods containing added sugars.    Eat healthy foods. Alternate 3 meals with 3 snacks.  Lose weight if overweight.  Carry a medical alert card or wear your medical alert jewelry.  Carry a 15 gram  carbohydrate snack with you at all times to treat low blood glucose (hypoglycemia). Some examples of 15 gram carbohydrate snacks include:  Glucose tablets, 3 or 4   Glucose gel, 15 gram tube  Raisins, 2 tablespoons (24 grams)  Jelly beans, 6  Animal crackers, 8  Regular pop, 4 ounces (120 mL)  Gummy treats, 9  Recognize hypoglycemia. Hypoglycemia occurs with blood glucose levels of 70 mg/dL and below. The risk for hypoglycemia increases when fasting or skipping meals, during or after intense exercise, and during sleep. Hypoglycemia symptoms can include:  Tremors or shakes.  Decreased ability to concentrate.  Sweating.  Increased heart rate.  Headache.  Dry mouth.  Hunger.  Irritability.  Anxiety.  Restless sleep.  Altered speech or coordination.  Confusion.  Treat hypoglycemia promptly. If you are alert and able to safely swallow, follow the 15:15 rule:  Take 15 20 grams of rapid-acting glucose or carbohydrate. Rapid-acting options include glucose gel, glucose tablets, or 4 ounces (120 mL) of fruit juice, regular soda, or low fat milk.  Check your blood glucose level 15 minutes after taking the glucose.  Take 15 20 grams more of glucose if the repeat blood glucose level is still 70 mg/dL or below.  Eat a meal or snack within 1 hour once blood glucose levels return to normal.    Be alert to polyuria and polydipsia which are early signs of hyperglycemia. An early awareness of hyperglycemia allows for prompt treatment. Treat hyperglycemia as directed by your caregiver.  Engage in at least 150 minutes of moderate-intensity physical activity a week, spread over at least 3 days of the week or as directed by your caregiver. In addition, you should engage in resistance exercise at least 2 times a week or as directed by your caregiver.  Adjust your medicine and food intake as needed if you start a new exercise or sport.  Follow your sick day plan at any time you  are unable to eat or drink as usual.  Avoid tobacco use.  Limit alcohol intake to no more than 1 drink per day for nonpregnant women and 2 drinks per day for men. You should drink alcohol only when you are also eating food. Talk with your caregiver whether alcohol is safe for you. Tell your caregiver if you drink alcohol several times a week.  Follow up with your caregiver regularly.  Schedule an eye exam soon after the diagnosis of type 2 diabetes and then annually.  Perform daily skin and foot care. Examine your skin and feet daily for cuts, bruises, redness, nail problems, bleeding, blisters, or sores. A foot exam by a caregiver should be done annually.  Brush your teeth and gums at least twice a day and floss at least once a day. Follow up with your dentist regularly.  Share your diabetes management plan with your workplace or school.  Stay up-to-date with immunizations.  Learn to manage stress.  Obtain ongoing diabetes education and support as needed.  Participate in, or seek rehabilitation as needed to maintain or improve independence and quality of life. Request a physical or occupational therapy referral if you are having foot or hand numbness or difficulties with grooming,   dressing, eating, or physical activity. SEEK MEDICAL CARE IF:   You are unable to eat food or drink fluids for more than 6 hours.  You have nausea and vomiting for more than 6 hours.  Your blood glucose level is over 240 mg/dL.  There is a change in mental status.  You develop an additional serious illness.  You have diarrhea for more than 6 hours.  You have been sick or have had a fever for a couple of days and are not getting better.  You have pain during any physical activity.  SEEK IMMEDIATE MEDICAL CARE IF:  You have difficulty breathing.  You have moderate to large ketone levels. MAKE SURE YOU:  Understand these instructions.  Will watch your condition.  Will get help right away if  you are not doing well or get worse. Document Released: 11/22/2005 Document Revised: 08/16/2012 Document Reviewed: 06/20/2012 Exit[employer] Patient Information 2014 [employer].  

## 2014-04-19 NOTE — Progress Notes (Signed)
Subjective:    Patient ID: Maria Franklin, female    DOB: [birth year], 53 y.o.   MRN: [id number]  HPI  Pt presents to the clinic today to reestablish care with me. She had transferred to St[facility] [name], PA-C. Interval notes reviewed. She is here today for med refill.  Asthma: On Advair and Albuterol. Well controlled.  HTN: We controlled on norvasc, lisinopril, HCTZ and carvedilol. She was taken off for a short period of time secondary to swelling. She was started on Bystolic but could not afford it, so she switched back to her prior regimen.  HLD: Last lipid profile 01/2014- looks great. Controlled with liptor  DM2: She tried diet and weight loss. She has actually gained about 3 pounds since her last visit.  Hypothyroidism: Last TSH 01/2014 normal. Stable on current synthroid dose.  Obesity: Weight is trending up.  Allergic Rhinitis: She is zyrtec and nasonex.  Schizophrenia: Sees behavioral health. On haldol, doing well.  Review of Systems      Past Medical History  Diagnosis Date  . Asthma   . Chicken pox   . Allergy   . Hypertension   . Hyperlipidemia   . Thyroid disease     Current Outpatient Prescriptions  Medication Sig Dispense Refill  . albuterol (PROVENTIL HFA;VENTOLIN HFA) 108 (90 BASE) MCG/ACT inhaler Inhale 1-2 puffs into the lungs every 6 (six) hours as needed for wheezing or shortness of breath.  3 Inhaler  0  . amLODipine (NORVASC) 10 MG tablet Take 1 tablet (10 mg total) by mouth daily.  90 tablet  0  . atorvastatin (LIPITOR) 10 MG tablet Take 1 tablet (10 mg total) by mouth daily.  90 tablet  0  . B Complex-C (B-COMPLEX WITH VITAMIN C) tablet Take 1 tablet by mouth daily.  90 tablet  0  . carvedilol (COREG) 12.5 MG tablet Take 1 tablet (12.5 mg total) by mouth 2 (two) times daily with a meal.  60 tablet  5  . cetirizine (ZYRTEC) 10 MG tablet Take 1 tablet (10 mg total) by mouth daily.  90 tablet  0  . Fluticasone-Salmeterol (ADVAIR) 250-50 MCG/DOSE AEPB  Inhale 1 puff into the lungs 2 (two) times daily.  120 each  0  . haloperidol (HALDOL) 2 MG tablet Take 1 tablet (2 mg total) by mouth at bedtime.  90 tablet  0  . hydrochlorothiazide (HYDRODIURIL) 25 MG tablet Take 25 mg by mouth daily.      Marland Kitchen levothyroxine (SYNTHROID, LEVOTHROID) 50 MCG tablet Take 1 tablet (50 mcg total) by mouth daily before breakfast.  90 tablet  0  . lisinopril (PRINIVIL,ZESTRIL) 40 MG tablet Take 1 tablet (40 mg total) by mouth daily.  30 tablet  5  . mometasone (NASONEX) 50 MCG/ACT nasal spray Place 2 sprays into the nose daily.  52 g  0  . Omega-3 Fatty Acids (FISH OIL PO) Take 1 capsule by mouth daily.      . potassium chloride SA (K-DUR,KLOR-CON) 20 MEQ tablet Take 1 tablet (20 mEq total) by mouth 2 (two) times daily.  180 tablet  0  . tetrahydrozoline (VISINE) 0.05 % ophthalmic solution Place 2 drops into both eyes.       No current facility-administered medications for this visit.    No Known Allergies  Family History  Problem Relation Age of Onset  . Mental illness Mother   . Hypertension Mother   . Schizophrenia Mother   . Cancer Father     LUNG  .  Cancer Sister     BREAST  . Mental illness Brother   . Hypertension Brother   . Schizophrenia Brother   . Heart disease Maternal Grandmother   . Schizophrenia Brother     History   Social History  . Marital Status: Married    Spouse Name: N/A    Number of Children: N/A  . Years of Education: N/A   Occupational History  . Not on file.   Social History Main Topics  . Smoking status: Former Research scientist (life sciences)  . Smokeless tobacco: Not on file  . Alcohol Use: No  . Drug Use: No  . Sexual Activity: Not on file   Other Topics Concern  . Not on file   Social History Narrative   3 [place]     Constitutional: Denies fever, malaise, fatigue, headache or abrupt weight changes.  Respiratory: Denies difficulty breathing, shortness of breath, cough or  sputum production.   Cardiovascular: Denies chest pain, chest tightness, palpitations or swelling in the hands or feet.  Skin: Denies redness, rashes, lesions or ulcercations.  Neurological: Denies dizziness, difficulty with memory, difficulty with speech or problems with balance and coordination.   No other specific complaints in a complete review of systems (except as listed in HPI above).   Objective:   Physical Exam   BP 130/84  Pulse 72  Temp(Src) 98.1 F (36.7 C) (Oral)  Wt 239 lb 4 oz (108.523 kg)  SpO2 97% Wt Readings from Last 3 Encounters:  04/19/14 239 lb 4 oz (108.523 kg)  03/28/14 234 lb (106.142 kg)  01/14/14 232 lb 6.4 oz (105.416 kg)    General: Appears her stated age, obese but  well developed, well nourished in NAD. Skin: Warm, dry and intact. No rashes, lesions or ulcerations noted. Cardiovascular: Normal rate and rhythm. S1,S2 noted.  No murmur, rubs or gallops noted. No JVD or BLE edema. No carotid bruits noted. Pulmonary/Chest: Normal effort and positive vesicular breath sounds. No respiratory distress. No wheezes, rales or ronchi noted.  Neurological: Alert and oriented.    BMET    Component Value Date/Time   NA 137 01/14/2014 1224   K 3.4* 01/14/2014 1224   CL 100 01/14/2014 1224   CO2 27 01/14/2014 1224   GLUCOSE 125* 01/14/2014 1224   BUN 16 01/14/2014 1224   CREATININE 1.1 01/14/2014 1224   CALCIUM 9.0 01/14/2014 1224   GFRNONAA 60* 01/01/2014 0330   GFRAA 70* 01/01/2014 0330    Lipid Panel     Component Value Date/Time   CHOL 231* 01/14/2014 1224   TRIG 79.0 01/14/2014 1224   HDL 113.20 01/14/2014 1224   CHOLHDL 2 01/14/2014 1224   VLDL 15.8 01/14/2014 1224   LDLCALC 55 07/13/2013 1032    CBC    Component Value Date/Time   WBC 13.4* 01/14/2014 1224   RBC 4.05 01/14/2014 1224   HGB 11.4* 01/14/2014 1224   HCT 35.2* 01/14/2014 1224   PLT 227.0 01/14/2014 1224   MCV 86.9 01/14/2014 1224   MCH 28.1 01/01/2014 0330   MCHC 32.5 01/14/2014 1224   RDW 14.9* 01/14/2014 1224    LYMPHSABS 1.7 01/14/2014 1224   MONOABS 0.3 01/14/2014 1224   EOSABS 0.1 01/14/2014 1224   BASOSABS 0.0 01/14/2014 1224    Hgb A1C Lab Results  Component Value Date   HGBA1C 6.9* 01/14/2014        Assessment & Plan:

## 2014-04-19 NOTE — Assessment & Plan Note (Signed)
No issues on lipitor  Medication refilled today

## 2014-04-19 NOTE — Assessment & Plan Note (Signed)
She would like to switch from nasonex to flonase Medication called into pharmacy Continue zyrtec

## 2014-04-19 NOTE — Assessment & Plan Note (Signed)
Discussed lifestyle changes Will recheck A1C today  If no improvement, will start Metformin

## 2014-04-19 NOTE — Assessment & Plan Note (Signed)
Followed by behavioral health

## 2014-04-19 NOTE — Telephone Encounter (Signed)
Relevant patient education assigned to patient using Emmi. ° °

## 2014-06-10 ENCOUNTER — Other Ambulatory Visit: Payer: Self-pay

## 2014-06-10 MED ORDER — POTASSIUM CHLORIDE CRYS ER 20 MEQ PO TBCR: 20.0000 meq | EXTENDED_RELEASE_TABLET | Freq: Two times a day (BID) | ORAL | Status: DC

## 2014-06-10 NOTE — Telephone Encounter (Signed)
Pt request 90 day refill klor con to [facility].advised pt done.

## 2014-06-27 ENCOUNTER — Encounter (HOSPITAL_COMMUNITY): Payer: Self-pay | Admitting: Psychiatry

## 2014-06-27 ENCOUNTER — Ambulatory Visit (INDEPENDENT_AMBULATORY_CARE_PROVIDER_SITE_OTHER): Payer: Medicare Other | Admitting: Psychiatry

## 2014-06-27 VITALS — BP 108/75 | HR 71 | Ht 64.0 in | Wt 239.6 lb

## 2014-06-27 DIAGNOSIS — F200 Paranoid schizophrenia: Secondary | ICD-10-CM | POA: Diagnosis not present

## 2014-06-27 MED ORDER — HALOPERIDOL 2 MG PO TABS: 2.0000 mg | ORAL_TABLET | Freq: Every day | ORAL | Status: DC

## 2014-06-27 NOTE — Progress Notes (Signed)
[place] [phone] Progress Note  Maria Franklin [id number] 53 y.o.  06/27/2014 9:00 AM  Chief Complaint:  Medication management and followup.      History of Present Illness: Maria Franklin came for her followup appointment.  She is taking Haldol at bedtime.  She is sleeping good.  She denies paranoia, hallucination or any irritability.  Her anxiety is under control.  She is not taking Vistaril because her insurance does not approve.  She does not feel to take any medication for her anxiety at this time.  Recently she saw her primary care physician .  Her hemoglobin A1c is 6.5.  She is reluctant to add medication for diabetes and she wants to lose the weight.  She start walking and doing regular exercise.  She described her mood as good.  She is looking for a part-time job and so far she has not able to find it.  She denies any agitation or anger.  She denies any crying spells.  Her appetite is okay.  Her vitals are stable.  Patient is not interested in counseling at this time.  Patient lives with her husband who is very supportive.  Suicidal Ideation: No Plan Formed: No Patient has means to carry out plan: No  Homicidal Ideation: No Plan Formed: No Patient has means to carry out plan: No  ROS  Psychiatric: Agitation: No Hallucination: No Depressed Mood: No Insomnia: No Hypersomnia: No Altered Concentration: No Feels Worthless: No Grandiose Ideas: No Belief In Special Powers: No New/Increased Substance Abuse: No Compulsions: No  Neurologic: Headache: No Seizure: No Paresthesias: No  Medical History:  Patient has a hypertension, hyperlipidemia, type 2 diabetes mellitus, hypothyroidism and obesity.    Outpatient Encounter Prescriptions as of 06/27/2014  Medication Sig  . albuterol (PROVENTIL HFA;VENTOLIN HFA) 108 (90 BASE) MCG/ACT inhaler Inhale 1-2 puffs into the lungs every 6 (six) hours as needed for wheezing or shortness of breath.  Marland Kitchen amLODipine (NORVASC) 10 MG  tablet Take 1 tablet (10 mg total) by mouth daily.  Marland Kitchen atorvastatin (LIPITOR) 10 MG tablet Take 1 tablet (10 mg total) by mouth daily.  . B Complex-C (B-COMPLEX WITH VITAMIN C) tablet Take 1 tablet by mouth daily.  . carvedilol (COREG) 12.5 MG tablet Take 1 tablet (12.5 mg total) by mouth 2 (two) times daily with a meal.  . cetirizine (ZYRTEC) 10 MG tablet Take 1 tablet (10 mg total) by mouth daily.  . fluticasone (FLONASE) 50 MCG/ACT nasal spray Place 2 sprays into both nostrils daily.  . Fluticasone-Salmeterol (ADVAIR) 250-50 MCG/DOSE AEPB Inhale 1 puff into the lungs 2 (two) times daily.  . haloperidol (HALDOL) 2 MG tablet Take 1 tablet (2 mg total) by mouth at bedtime.  . hydrochlorothiazide (HYDRODIURIL) 25 MG tablet Take 1 tablet (25 mg total) by mouth daily.  Marland Kitchen levothyroxine (SYNTHROID, LEVOTHROID) 50 MCG tablet Take 1 tablet (50 mcg total) by mouth daily before breakfast.  . lisinopril (PRINIVIL,ZESTRIL) 40 MG tablet Take 1 tablet (40 mg total) by mouth daily.  . mometasone (NASONEX) 50 MCG/ACT nasal spray Place 2 sprays into the nose daily.  . Omega-3 Fatty Acids (FISH OIL PO) Take 1 capsule by mouth daily.  . potassium chloride SA (K-DUR,KLOR-CON) 20 MEQ tablet Take 1 tablet (20 mEq total) by mouth 2 (two) times daily.  Marland Kitchen tetrahydrozoline (VISINE) 0.05 % ophthalmic solution Place 2 drops into both eyes.  . [DISCONTINUED] haloperidol (HALDOL) 2 MG tablet Take 1 tablet (2 mg total) by mouth at bedtime.  Past Psychiatric History/Hospitalization(s): Patient has history of paranoia and delusions.  She has one hospitalization at [facility].  She took overdose on her medication.  She was noncompliant with medication and required admission 4 months later.  She had tried Abilify with good response however at that time she could not afford it.  Patient denies any history of physical sexual verbal and emotional abuse. Anxiety: Yes Bipolar Disorder: No Depression: Yes Mania:  No Psychosis: No Schizophrenia: Yes Personality Disorder: No Hospitalization for psychiatric illness: Yes History of Electroconvulsive Shock Therapy: No Prior Suicide Attempts: Yes  Physical Exam: Constitutional:  BP 108/75  Pulse 71  Ht 5\' 4"  (1.626 m)  Wt 239 lb 9.6 oz (108.682 kg)  BMI 41.11 kg/m2 she was reluctant to get blood pressure taken.  Recent Results (from the past 2160 hour(s))  HEMOGLOBIN A1C     Status: None   Collection Time    04/19/14  1:54 PM      Result Value Ref Range   Hemoglobin A1C 6.5  4.6 - 6.5 %   Comment: Glycemic Control Guidelines for People with Diabetes:Non Diabetic:  <6%Goal of Therapy: <7%Additional Action Suggested:  >8%    General Appearance: well nourished, obese and Maintained fair eye contact  Musculoskeletal: Strength & Muscle Tone: within normal limits Gait & Station: normal Patient leans: N/A  Psychiatric: Speech (describe rate, volume, coherence, spontaneity, and abnormalities if any): Fasting but clear and coherent.  Normal tone and volume.  Thought Process (describe rate, content, abstract reasoning, and computation): Logical and goal-directed.      Associations: Relevant and Intact  Thoughts: normal and Feeling overwhelmed.  Mental Status: Orientation: oriented to person, place, time/date, situation, day of week and month of year Mood & Affect: anxiety Attention Span & Concentration: Fair  Established Problem, Stable/Improving (1), Review of Psycho-Social Stressors (1), Review or order clinical lab tests (1), Review of Last Therapy Session (1) and Review of Medication Regimen & Side Effects (2)  Assessment: Axis I: Schizophrenia chronic paranoid type, anxiety disorder  Axis II: Deferred  Axis III: See medical history  Axis IV: Mild  Axis V: 60-65   Plan:  Patient is doing better on current dose of Haldol.  Encourage great loss , and will exercise and watching her calorie intake.  At this time she does not have  any side effects of Haldol.  Continue Haldol 2 mg at bedtime.  Recommended to call us back if she has any question or any concern.  Followup in 3 months.  Maria Franklin T., MD 06/27/2014

## 2014-07-09 ENCOUNTER — Other Ambulatory Visit: Payer: Self-pay

## 2014-07-09 DIAGNOSIS — Z1231 Encounter for screening mammogram for malignant neoplasm of breast: Secondary | ICD-10-CM

## 2014-07-17 ENCOUNTER — Ambulatory Visit
Admission: RE | Admit: 2014-07-17 | Discharge: 2014-07-17 | Disposition: A | Discharge status: Home / Self Care | Payer: Medicare Other | Source: Ambulatory Visit

## 2014-07-17 ENCOUNTER — Other Ambulatory Visit: Payer: Self-pay

## 2014-07-17 DIAGNOSIS — Z1231 Encounter for screening mammogram for malignant neoplasm of breast: Secondary | ICD-10-CM

## 2014-07-17 MED ORDER — FLUTICASONE-SALMETEROL 250-50 MCG/DOSE IN AEPB: 1.0000 | INHALATION_SPRAY | Freq: Two times a day (BID) | RESPIRATORY_TRACT | Status: DC

## 2014-07-17 NOTE — Telephone Encounter (Signed)
Pt left v/m requesting refill advair to [facility] [place]. Pt notified refill done.

## 2014-07-30 ENCOUNTER — Other Ambulatory Visit: Payer: Self-pay | Admitting: Physician Assistant

## 2014-08-06 ENCOUNTER — Other Ambulatory Visit: Payer: Self-pay | Admitting: *Deleted

## 2014-08-06 MED ORDER — LISINOPRIL 40 MG PO TABS
40.0000 mg | ORAL_TABLET | Freq: Every day | ORAL | Status: DC
Start: 2014-08-06 — End: 2014-10-25

## 2014-08-15 DIAGNOSIS — J329 Chronic sinusitis, unspecified: Secondary | ICD-10-CM | POA: Diagnosis not present

## 2014-08-15 DIAGNOSIS — J320 Chronic maxillary sinusitis: Secondary | ICD-10-CM | POA: Diagnosis not present

## 2014-08-15 DIAGNOSIS — J322 Chronic ethmoidal sinusitis: Secondary | ICD-10-CM | POA: Diagnosis not present

## 2014-08-15 DIAGNOSIS — J330 Polyp of nasal cavity: Secondary | ICD-10-CM | POA: Diagnosis not present

## 2014-08-15 DIAGNOSIS — J331 Polypoid sinus degeneration: Secondary | ICD-10-CM | POA: Diagnosis not present

## 2014-08-15 DIAGNOSIS — J342 Deviated nasal septum: Secondary | ICD-10-CM | POA: Diagnosis not present

## 2014-08-20 ENCOUNTER — Other Ambulatory Visit: Payer: Self-pay

## 2014-08-20 MED ORDER — CARVEDILOL 12.5 MG PO TABS: 12.5000 mg | ORAL_TABLET | Freq: Two times a day (BID) | ORAL | Status: DC

## 2014-09-11 DIAGNOSIS — J322 Chronic ethmoidal sinusitis: Secondary | ICD-10-CM | POA: Diagnosis not present

## 2014-09-11 DIAGNOSIS — J329 Chronic sinusitis, unspecified: Secondary | ICD-10-CM | POA: Diagnosis not present

## 2014-09-11 DIAGNOSIS — J331 Polypoid sinus degeneration: Secondary | ICD-10-CM | POA: Diagnosis not present

## 2014-09-11 DIAGNOSIS — J330 Polyp of nasal cavity: Secondary | ICD-10-CM | POA: Diagnosis not present

## 2014-09-11 DIAGNOSIS — J309 Allergic rhinitis, unspecified: Secondary | ICD-10-CM | POA: Diagnosis not present

## 2014-09-11 DIAGNOSIS — J342 Deviated nasal septum: Secondary | ICD-10-CM | POA: Diagnosis not present

## 2014-09-11 DIAGNOSIS — J320 Chronic maxillary sinusitis: Secondary | ICD-10-CM | POA: Diagnosis not present

## 2014-09-27 ENCOUNTER — Encounter (HOSPITAL_COMMUNITY): Payer: Self-pay | Admitting: Psychiatry

## 2014-09-27 ENCOUNTER — Ambulatory Visit (INDEPENDENT_AMBULATORY_CARE_PROVIDER_SITE_OTHER): Payer: Medicare Other | Admitting: Psychiatry

## 2014-09-27 VITALS — BP 117/76 | HR 83 | Wt 237.4 lb

## 2014-09-27 DIAGNOSIS — F419 Anxiety disorder, unspecified: Secondary | ICD-10-CM | POA: Diagnosis not present

## 2014-09-27 DIAGNOSIS — F200 Paranoid schizophrenia: Secondary | ICD-10-CM | POA: Diagnosis not present

## 2014-09-27 MED ORDER — HALOPERIDOL 2 MG PO TABS: 2.0000 mg | ORAL_TABLET | Freq: Every day | ORAL | Status: DC

## 2014-09-27 NOTE — Progress Notes (Signed)
[place] Progress Note  Maria Franklin [id number] 53 y.o.  09/27/2014 8:52 AM  Chief Complaint:  Medication management and followup.      History of Present Illness: Maria Franklin came for her followup appointment.  She is compliant with her medication.  She denies any irritability anger or any mood swings.  She still has paranoia but it is under control.  She gets very paranoid when she is around people.  She denies any hallucination or any crying spells.  Her sleep is good.  Recently she was given steroids for sinus infection.  She also discussed her hemoglobin A1c with her primary care physician and repeat hemoglobin A1c  is improved.  Patient told that her physician did not start any medication for diabetes.  She is trying to do physical exercise.  She is active and her energy level is good.  Her vitals are stable.  She lives with her husband was very supportive.  She does not drink or use any illegal substances.  Suicidal Ideation: No Plan Formed: No Patient has means to carry out plan: No  Homicidal Ideation: No Plan Formed: No Patient has means to carry out plan: No  ROS  Psychiatric: Agitation: No Hallucination: No Depressed Mood: No Insomnia: No Hypersomnia: No Altered Concentration: No Feels Worthless: No Grandiose Ideas: No Belief In Special Powers: No New/Increased Substance Abuse: No Compulsions: No  Neurologic: Headache: No Seizure: No Paresthesias: No  Medical History:  Patient has a hypertension, hyperlipidemia, type 2 diabetes mellitus, hypothyroidism and obesity.    Outpatient Encounter Prescriptions as of 09/27/2014  Medication Sig  . albuterol (PROVENTIL HFA;VENTOLIN HFA) 108 (90 BASE) MCG/ACT inhaler Inhale 1-2 puffs into the lungs every 6 (six) hours as needed for wheezing or shortness of breath.  Marland Kitchen amLODipine (NORVASC) 10 MG tablet Take 1 tablet (10 mg total) by mouth daily.  Marland Kitchen atorvastatin (LIPITOR) 10 MG tablet Take 1 tablet (10 mg  total) by mouth daily.  . B Complex-C (B-COMPLEX WITH VITAMIN C) tablet Take 1 tablet by mouth daily.  . carvedilol (COREG) 12.5 MG tablet Take 1 tablet (12.5 mg total) by mouth 2 (two) times daily with a meal.  . cetirizine (ZYRTEC) 10 MG tablet Take 1 tablet (10 mg total) by mouth daily.  . fluticasone (FLONASE) 50 MCG/ACT nasal spray Place 2 sprays into both nostrils daily.  . Fluticasone-Salmeterol (ADVAIR) 250-50 MCG/DOSE AEPB Inhale 1 puff into the lungs 2 (two) times daily.  . haloperidol (HALDOL) 2 MG tablet Take 1 tablet (2 mg total) by mouth at bedtime.  . hydrochlorothiazide (HYDRODIURIL) 25 MG tablet Take 1 tablet (25 mg total) by mouth daily.  Marland Kitchen levothyroxine (SYNTHROID, LEVOTHROID) 50 MCG tablet Take 1 tablet (50 mcg total) by mouth daily before breakfast.  . lisinopril (PRINIVIL,ZESTRIL) 40 MG tablet Take 1 tablet (40 mg total) by mouth daily.  . mometasone (NASONEX) 50 MCG/ACT nasal spray Place 2 sprays into the nose daily.  . Omega-3 Fatty Acids (FISH OIL PO) Take 1 capsule by mouth daily.  . potassium chloride SA (K-DUR,KLOR-CON) 20 MEQ tablet Take 1 tablet (20 mEq total) by mouth 2 (two) times daily.  Marland Kitchen tetrahydrozoline (VISINE) 0.05 % ophthalmic solution Place 2 drops into both eyes.  . [DISCONTINUED] haloperidol (HALDOL) 2 MG tablet Take 1 tablet (2 mg total) by mouth at bedtime.    Past Psychiatric History/Hospitalization(s): Patient has history of paranoia and delusions.  She has one hospitalization at [facility].  She took overdose on her  medication.  She was noncompliant with medication and required admission 4 months later.  She had tried Abilify with good response however at that time she could not afford it.  Patient denies any history of physical sexual verbal and emotional abuse. Anxiety: Yes Bipolar Disorder: No Depression: Yes Mania: No Psychosis: No Schizophrenia: Yes Personality Disorder: No Hospitalization for psychiatric illness: Yes History of  Electroconvulsive Shock Therapy: No Prior Suicide Attempts: Yes  Physical Exam: Constitutional:  BP 117/76  Pulse 83  Wt 237 lb 6.4 oz (107.684 kg) she was reluctant to get blood pressure taken.  No results found for this or any previous visit (from the past 2160 hour(s)). General Appearance: well nourished, obese and Maintained fair eye contact  Musculoskeletal: Strength & Muscle Tone: within normal limits Gait & Station: normal Patient leans: N/A  Psychiatric: Speech (describe rate, volume, coherence, spontaneity, and abnormalities if any): Fasting but clear and coherent.  Normal tone and volume.  Thought Process (describe rate, content, abstract reasoning, and computation): Logical and goal-directed.      Associations: Relevant and Intact  Thoughts: normal  Mental Status: Orientation: oriented to person, place, time/date, situation, day of week and month of year Mood & Affect: anxiety Attention Span & Concentration: Fair  Established Problem, Stable/Improving (1), Review of Psycho-Social Stressors (1), Review of Last Therapy Session (1) and Review of Medication Regimen & Side Effects (2)  Assessment: Axis I: Schizophrenia chronic paranoid type, anxiety disorder  Axis II: Deferred  Axis III: See medical history  Axis IV: Mild  Axis V: 60-65   Plan:  Patient is  stable on her current dose of Haldol.  Encourage weight loss and  recommended regular exercise.  Patient does not have any EPS or any tremors.  I will continue Haldol 2 mg at bedtime.  Recommended to call us back if she has any question or any concern.  Followup in 3 months.  Buck Mcaffee T., MD 09/27/2014

## 2014-10-05 ENCOUNTER — Other Ambulatory Visit: Payer: Self-pay | Admitting: Internal Medicine

## 2014-10-07 ENCOUNTER — Other Ambulatory Visit: Payer: Self-pay | Admitting: Internal Medicine

## 2014-10-21 ENCOUNTER — Ambulatory Visit (INDEPENDENT_AMBULATORY_CARE_PROVIDER_SITE_OTHER)
Admission: RE | Admit: 2014-10-21 | Discharge: 2014-10-21 | Disposition: A | Discharge status: Home / Self Care | Payer: Medicare Other | Source: Ambulatory Visit | Attending: Internal Medicine | Admitting: Internal Medicine

## 2014-10-21 ENCOUNTER — Encounter: Payer: Self-pay | Admitting: Internal Medicine

## 2014-10-21 ENCOUNTER — Ambulatory Visit (INDEPENDENT_AMBULATORY_CARE_PROVIDER_SITE_OTHER): Payer: Medicare Other | Admitting: Internal Medicine

## 2014-10-21 VITALS — BP 128/84 | HR 74 | Temp 98.4°F | Wt 236.0 lb

## 2014-10-21 DIAGNOSIS — E039 Hypothyroidism, unspecified: Secondary | ICD-10-CM

## 2014-10-21 DIAGNOSIS — J309 Allergic rhinitis, unspecified: Secondary | ICD-10-CM | POA: Diagnosis not present

## 2014-10-21 DIAGNOSIS — E119 Type 2 diabetes mellitus without complications: Secondary | ICD-10-CM | POA: Diagnosis not present

## 2014-10-21 DIAGNOSIS — E785 Hyperlipidemia, unspecified: Secondary | ICD-10-CM

## 2014-10-21 DIAGNOSIS — E669 Obesity, unspecified: Secondary | ICD-10-CM | POA: Diagnosis not present

## 2014-10-21 DIAGNOSIS — M25512 Pain in left shoulder: Secondary | ICD-10-CM | POA: Diagnosis not present

## 2014-10-21 DIAGNOSIS — I10 Essential (primary) hypertension: Secondary | ICD-10-CM

## 2014-10-21 DIAGNOSIS — Z23 Encounter for immunization: Secondary | ICD-10-CM

## 2014-10-21 LAB — HEMOGLOBIN A1C: Hgb A1c MFr Bld: 6.4 % (ref 4.6–6.5)

## 2014-10-21 LAB — CBC
HEMATOCRIT: 35 % — AB (ref 36.0–46.0)
Hemoglobin: 11.4 g/dL — ABNORMAL LOW (ref 12.0–15.0)
MCHC: 32.5 g/dL (ref 30.0–36.0)
MCV: 83.2 fl (ref 78.0–100.0)
Platelets: 290 10*3/uL (ref 150.0–400.0)
RBC: 4.21 Mil/uL (ref 3.87–5.11)
RDW: 15 % (ref 11.5–15.5)
WBC: 9.2 10*3/uL (ref 4.0–10.5)

## 2014-10-21 NOTE — Assessment & Plan Note (Signed)
Well controlled on multiple medications Will consider peeling back on some of these at next visit Will check CBC and CMET today

## 2014-10-21 NOTE — Progress Notes (Signed)
Subjective:    Patient ID: Maria Franklin, female    DOB: [birth year], 53 y.o.   MRN: [id number]  HPI  Pt presents to the clinic today for 6 month follow up of chronic conditions.  She does have some concerns today about left shoulder pain. She reports this started in June or July of 2015. She is unable to describe the pain but reports it has progressively gotten worse. She reports it seems to be worse when she sleeps. She does lay on that side. She has trouble reaching her arm across her body. She denies numbness or tingling in the arm. She has tried Advil OTC with out any relief. She denies any injury to the area.  HTN: Well controlled on Norvasc, Lisinopril, HCTZ and Coreg.   HLD: Denies myalgias on Lipitor. Last lipid profile reviewed.  DM2: She does not monitor sugars. She is currently not medicated. Last A1C reviewed. Weight is down 3 lbs. She wants a flu shot today. She has never had a pneumonia shot. She has noticed some toe fungus but denies any issues with sensation, wounds on her feet. She has tried multiple things over the counter for her fungus without relief. Eye exam was 10/2013.   Hypothyroidism: Denies s/s of hypothyroidism on current dose of synthroid.  She will need refills of all of her medications today. She does request 90 day supply.  Review of Systems      Past Medical History  Diagnosis Date  . Asthma   . Chicken pox   . Allergy   . Hypertension   . Hyperlipidemia   . Thyroid disease     Current Outpatient Prescriptions  Medication Sig Dispense Refill  . albuterol (VENTOLIN HFA) 108 (90 BASE) MCG/ACT inhaler Inhale 1-2 puffs into the lungs every 6 (six) hours as needed for wheezing or shortness of breath.    Marland Kitchen amLODipine (NORVASC) 10 MG tablet Take 1 tablet (10 mg total) by mouth daily. 90 tablet 1  . atorvastatin (LIPITOR) 10 MG tablet TAKE ONE TABLET BY MOUTH ONCE DAILY 30 tablet 0  . B Complex-C (B-COMPLEX WITH VITAMIN C) tablet Take 1 tablet by  mouth daily. 90 tablet 0  . carvedilol (COREG) 12.5 MG tablet Take 1 tablet (12.5 mg total) by mouth 2 (two) times daily with a meal. 180 tablet 1  . cetirizine (ZYRTEC) 10 MG tablet TAKE ONE TABLET BY MOUTH ONCE DAILY 90 tablet 0  . fluticasone (FLONASE) 50 MCG/ACT nasal spray Place 2 sprays into both nostrils daily. 16 g 6  . Fluticasone-Salmeterol (ADVAIR) 250-50 MCG/DOSE AEPB Inhale 1 puff into the lungs 2 (two) times daily. 120 each 0  . haloperidol (HALDOL) 2 MG tablet Take 1 tablet (2 mg total) by mouth at bedtime. 90 tablet 0  . hydrochlorothiazide (HYDRODIURIL) 25 MG tablet Take 1 tablet (25 mg total) by mouth daily. 30 tablet 0  . levothyroxine (SYNTHROID, LEVOTHROID) 50 MCG tablet Take 1 tablet (50 mcg total) by mouth daily before breakfast. 90 tablet 1  . lisinopril (PRINIVIL,ZESTRIL) 40 MG tablet Take 1 tablet (40 mg total) by mouth daily. 30 tablet 5  . mometasone (NASONEX) 50 MCG/ACT nasal spray Place 2 sprays into the nose daily. 52 g 0  . Omega-3 Fatty Acids (FISH OIL PO) Take 1 capsule by mouth daily.    . potassium chloride SA (K-DUR,KLOR-CON) 20 MEQ tablet Take 1 tablet (20 mEq total) by mouth 2 (two) times daily. 180 tablet 1  . tetrahydrozoline (VISINE) 0.05 %  ophthalmic solution Place 2 drops into both eyes.     No current facility-administered medications for this visit.    No Known Allergies  Family History  Problem Relation Age of Onset  . Mental illness Mother   . Hypertension Mother   . Schizophrenia Mother   . Cancer Father     LUNG  . Cancer Sister     BREAST  . Mental illness Brother   . Hypertension Brother   . Schizophrenia Brother   . Heart disease Maternal Grandmother   . Schizophrenia Brother     History   Social History  . Marital Status: Married    Spouse Name: N/A    Number of Children: N/A  . Years of Education: N/A   Occupational History  . Not on file.   Social History Main Topics  . Smoking status: Former Research scientist (life sciences)  . Smokeless  tobacco: Not on file  . Alcohol Use: No  . Drug Use: No  . Sexual Activity: Not on file   Other Topics Concern  . Not on file   Social History Narrative   3 [place]     Constitutional: Denies fever, malaise, fatigue, headache or abrupt weight changes.  Respiratory: Denies difficulty breathing, shortness of breath, cough or sputum production.   Cardiovascular: Denies chest pain, chest tightness, palpitations or swelling in the hands or feet.  Musculoskeletal: Pt reports left shoulder pain. Denies decrease in range of motion, difficulty with gait, muscle pain or joint swelling.  Skin: Pt reports toenail fungus. Denies redness, rashes, lesions or ulcercations.  Neurological: Denies dizziness, difficulty with memory, difficulty with speech or problems with balance and coordination.   No other specific complaints in a complete review of systems (except as listed in HPI above).  Objective:   Physical Exam   BP 128/84 mmHg  Pulse 74  Temp(Src) 98.4 F (36.9 C) (Oral)  Wt 236 lb (107.049 kg)  SpO2 98%  LMP 10/06/2014 Wt Readings from Last 3 Encounters:  10/21/14 236 lb (107.049 kg)  09/27/14 237 lb 6.4 oz (107.684 kg)  06/27/14 239 lb 9.6 oz (108.682 kg)    General: Appears her stated age, obese but well developed, well nourished in NAD. Skin: Warm, dry and intact. No rashes, lesions or ulcerations noted. Toenail fungus noted on left big toe and first toe. Toenail fungus noted on right second, third and fourth toes. Neck: Neck supple, trachea midline. No masses, lumps or thyromegaly present.  Cardiovascular: Normal rate and rhythm. S1,S2 noted.  No murmur, rubs or gallops noted. No JVD or BLE edema. No carotid bruits noted. Pulmonary/Chest: Normal effort and positive vesicular breath sounds. No respiratory distress. No wheezes, rales or ronchi noted.  Musculoskeletal: Decreased internal and external rotation of the left  shoulder. No pain with palpation of the clavicle, AC joint, biceps tendon or bursa. Strength 5/5 BUE. Negative drop can test. Neurological: Alert and oriented. Cranial nerves II-XII grossly intact. Coordination normal.    BMET    Component Value Date/Time   NA 137 01/14/2014 1224   K 3.4* 01/14/2014 1224   CL 100 01/14/2014 1224   CO2 27 01/14/2014 1224   GLUCOSE 125* 01/14/2014 1224   BUN 16 01/14/2014 1224   CREATININE 1.1 01/14/2014 1224   CALCIUM 9.0 01/14/2014 1224   GFRNONAA 60* 01/01/2014 0330   GFRAA 70* 01/01/2014 0330    Lipid Panel     Component Value Date/Time   CHOL  231* 01/14/2014 1224   TRIG 79.0 01/14/2014 1224   HDL 113.20 01/14/2014 1224   CHOLHDL 2 01/14/2014 1224   VLDL 15.8 01/14/2014 1224   LDLCALC 55 07/13/2013 1032    CBC    Component Value Date/Time   WBC 13.4* 01/14/2014 1224   RBC 4.05 01/14/2014 1224   HGB 11.4* 01/14/2014 1224   HCT 35.2* 01/14/2014 1224   PLT 227.0 01/14/2014 1224   MCV 86.9 01/14/2014 1224   MCH 28.1 01/01/2014 0330   MCHC 32.5 01/14/2014 1224   RDW 14.9* 01/14/2014 1224   LYMPHSABS 1.7 01/14/2014 1224   MONOABS 0.3 01/14/2014 1224   EOSABS 0.1 01/14/2014 1224   BASOSABS 0.0 01/14/2014 1224    Hgb A1C Lab Results  Component Value Date   HGBA1C 6.5 04/19/2014        Assessment & Plan:

## 2014-10-21 NOTE — Assessment & Plan Note (Signed)
Encouraged him to work on diet and exercise 

## 2014-10-21 NOTE — Addendum Note (Signed)
Addended by: Daralene Milch C on: 10/21/2014 02:21 PM   Modules accepted: Miquel Dunn

## 2014-10-21 NOTE — Assessment & Plan Note (Signed)
Will recheck TSH and free T4 Will adjust and refill medications based on levels

## 2014-10-21 NOTE — Progress Notes (Signed)
Pre visit review using our clinic review tool, if applicable. No additional management support is needed unless otherwise documented below in the visit note. 

## 2014-10-21 NOTE — Patient Instructions (Signed)

## 2014-10-21 NOTE — Assessment & Plan Note (Signed)
No current issues on flonase and zyrtec

## 2014-10-21 NOTE — Assessment & Plan Note (Signed)
Diet controlled Will repeat A1C today Flu and pneumobvax shot today If A1C elevated, will start Metformin. Discussed with patient and she agrees

## 2014-10-21 NOTE — Assessment & Plan Note (Signed)
No issues on current dose of Lipitor Will repeat lipid profile and CMET today Encouraged him to consume a low cholesterol diet

## 2014-10-22 LAB — COMPREHENSIVE METABOLIC PANEL
ALT: 17 U/L (ref 0–35)
AST: 23 U/L (ref 0–37)
Albumin: 4.3 g/dL (ref 3.5–5.2)
Alkaline Phosphatase: 87 U/L (ref 39–117)
BUN: 13 mg/dL (ref 6–23)
CALCIUM: 9.8 mg/dL (ref 8.4–10.5)
CO2: 23 mEq/L (ref 19–32)
Chloride: 107 mEq/L (ref 96–112)
Creatinine, Ser: 1.2 mg/dL (ref 0.4–1.2)
GFR: 62.14 mL/min (ref >60.00)
Glucose, Bld: 101 mg/dL — ABNORMAL HIGH (ref 70–99)
Potassium: 4.1 mEq/L (ref 3.5–5.1)
SODIUM: 142 meq/L (ref 135–145)
TOTAL PROTEIN: 7.8 g/dL (ref 6.0–8.3)
Total Bilirubin: 0.3 mg/dL (ref 0.2–1.2)

## 2014-10-22 LAB — T4, FREE: Free T4: 0.9 ng/dL (ref 0.60–1.60)

## 2014-10-22 LAB — LIPID PANEL
Cholesterol: 169 mg/dL (ref 0–200)
HDL: 70.6 mg/dL (ref >39.00)
LDL Cholesterol: 79 mg/dL (ref 0–99)
NonHDL: 98.4
TRIGLYCERIDES: 97 mg/dL (ref 0.0–149.0)
Total CHOL/HDL Ratio: 2
VLDL: 19.4 mg/dL (ref 0.0–40.0)

## 2014-10-22 LAB — TSH: TSH: 2.7 u[IU]/mL (ref 0.35–4.50)

## 2014-10-25 ENCOUNTER — Other Ambulatory Visit: Payer: Self-pay

## 2014-10-25 ENCOUNTER — Telehealth: Payer: Self-pay | Admitting: Internal Medicine

## 2014-10-25 ENCOUNTER — Encounter: Payer: Self-pay | Admitting: Internal Medicine

## 2014-10-25 DIAGNOSIS — I10 Essential (primary) hypertension: Secondary | ICD-10-CM

## 2014-10-25 DIAGNOSIS — J302 Other seasonal allergic rhinitis: Secondary | ICD-10-CM

## 2014-10-25 DIAGNOSIS — F200 Paranoid schizophrenia: Secondary | ICD-10-CM

## 2014-10-25 DIAGNOSIS — E039 Hypothyroidism, unspecified: Secondary | ICD-10-CM

## 2014-10-25 MED ORDER — FLUTICASONE-SALMETEROL 250-50 MCG/DOSE IN AEPB: 1.0000 | INHALATION_SPRAY | Freq: Two times a day (BID) | RESPIRATORY_TRACT | Status: DC

## 2014-10-25 MED ORDER — AMLODIPINE BESYLATE 10 MG PO TABS: 10.0000 mg | ORAL_TABLET | Freq: Every day | ORAL | Status: DC

## 2014-10-25 MED ORDER — ATORVASTATIN CALCIUM 10 MG PO TABS: 10.0000 mg | ORAL_TABLET | Freq: Every day | ORAL | Status: DC

## 2014-10-25 MED ORDER — CARVEDILOL 12.5 MG PO TABS: 12.5000 mg | ORAL_TABLET | Freq: Two times a day (BID) | ORAL | Status: DC

## 2014-10-25 MED ORDER — FLUTICASONE PROPIONATE 50 MCG/ACT NA SUSP: 2.0000 | Freq: Every day | NASAL | Status: DC

## 2014-10-25 MED ORDER — POTASSIUM CHLORIDE CRYS ER 20 MEQ PO TBCR: 20.0000 meq | EXTENDED_RELEASE_TABLET | Freq: Two times a day (BID) | ORAL | Status: DC

## 2014-10-25 MED ORDER — HYDROCHLOROTHIAZIDE 25 MG PO TABS: 25.0000 mg | ORAL_TABLET | Freq: Every day | ORAL | Status: DC

## 2014-10-25 MED ORDER — ALBUTEROL SULFATE HFA 108 (90 BASE) MCG/ACT IN AERS: 1.0000 | INHALATION_SPRAY | Freq: Four times a day (QID) | RESPIRATORY_TRACT | Status: DC | PRN

## 2014-10-25 MED ORDER — LEVOTHYROXINE SODIUM 50 MCG PO TABS: 50.0000 ug | ORAL_TABLET | Freq: Every day | ORAL | Status: DC

## 2014-10-25 MED ORDER — LISINOPRIL 40 MG PO TABS: 40.0000 mg | ORAL_TABLET | Freq: Every day | ORAL | Status: DC

## 2014-10-25 NOTE — Telephone Encounter (Signed)
Thank you melanie

## 2014-10-25 NOTE — Telephone Encounter (Signed)
Pt called stating medication has not been called in yet. She was told she needed labs before she could get any further refills. She did lab work on 10/21/14. Can we call in meds?  [facility]

## 2014-10-25 NOTE — Telephone Encounter (Signed)
Just got result not yesterday and printed out lab letter---Rx had been filled 10/09/14--to allow pt to have enough---Rx sent into pharmacy 90 day supply per pt request

## 2014-10-25 NOTE — Telephone Encounter (Signed)
Pt to called to check on status. Still no new medication or old medications have been called in. Please advise.

## 2014-10-28 ENCOUNTER — Other Ambulatory Visit: Payer: Self-pay | Admitting: Internal Medicine

## 2014-10-28 DIAGNOSIS — B351 Tinea unguium: Secondary | ICD-10-CM

## 2014-10-28 MED ORDER — TERBINAFINE HCL 250 MG PO TABS: 250.0000 mg | ORAL_TABLET | Freq: Every day | ORAL | Status: DC

## 2014-10-28 NOTE — Telephone Encounter (Signed)
All of pt's medication was sent 90 day with 1 refill to pharmacy on 10/25/14

## 2014-11-27 ENCOUNTER — Other Ambulatory Visit: Payer: Self-pay | Admitting: Internal Medicine

## 2014-11-27 ENCOUNTER — Other Ambulatory Visit (INDEPENDENT_AMBULATORY_CARE_PROVIDER_SITE_OTHER): Payer: Medicare Other

## 2014-11-27 DIAGNOSIS — B351 Tinea unguium: Secondary | ICD-10-CM

## 2014-11-27 LAB — COMPREHENSIVE METABOLIC PANEL
ALT: 16 U/L (ref 0–35)
AST: 21 U/L (ref 0–37)
Albumin: 3.7 g/dL (ref 3.5–5.2)
Alkaline Phosphatase: 81 U/L (ref 39–117)
BUN: 17 mg/dL (ref 6–23)
CALCIUM: 9 mg/dL (ref 8.4–10.5)
CHLORIDE: 104 meq/L (ref 96–112)
CO2: 29 mEq/L (ref 19–32)
Creatinine, Ser: 1.2 mg/dL (ref 0.4–1.2)
GFR: 60.91 mL/min (ref >60.00)
Glucose, Bld: 123 mg/dL — ABNORMAL HIGH (ref 70–99)
Potassium: 3.3 mEq/L — ABNORMAL LOW (ref 3.5–5.1)
Sodium: 138 mEq/L (ref 135–145)
Total Bilirubin: 0.3 mg/dL (ref 0.2–1.2)
Total Protein: 7.4 g/dL (ref 6.0–8.3)

## 2014-11-27 MED ORDER — TERBINAFINE HCL 250 MG PO TABS: 250.0000 mg | ORAL_TABLET | Freq: Every day | ORAL | Status: DC

## 2014-12-16 ENCOUNTER — Encounter (HOSPITAL_COMMUNITY): Payer: Self-pay | Admitting: Psychiatry

## 2014-12-16 ENCOUNTER — Ambulatory Visit (INDEPENDENT_AMBULATORY_CARE_PROVIDER_SITE_OTHER): Payer: Medicare Other | Admitting: Psychiatry

## 2014-12-16 VITALS — BP 128/84 | HR 82 | Ht 64.0 in | Wt 241.0 lb

## 2014-12-16 DIAGNOSIS — F419 Anxiety disorder, unspecified: Secondary | ICD-10-CM

## 2014-12-16 DIAGNOSIS — F200 Paranoid schizophrenia: Secondary | ICD-10-CM

## 2014-12-16 MED ORDER — HALOPERIDOL 2 MG PO TABS: 2.0000 mg | ORAL_TABLET | Freq: Every day | ORAL | Status: DC

## 2014-12-16 NOTE — Progress Notes (Signed)
[place] Progress Note  Maria Franklin [id number] 53 y.o.  12/16/2014 9:31 AM  Chief Complaint:  Medication management and followup.      History of Present Illness: Maria Franklin came for her followup appointment.  She is taking her medication and denies any side effects.  She saw her primary care physician and there has been no changes.  Patient denies less paranoia and less irritability.  She had a good Christmas.  She is taking Haldol 2 mg at bedtime.  She has no EPS, tremors or shakes.  Her sleep is good.  She is active but she is still reluctant to socialize because she gets paranoid around people.  Patient denies drinking or using any illegal substances.  Her appetite is okay.  Her vitals are stable.  She lives with her husband who is very supportive.    Suicidal Ideation: No Plan Formed: No Patient has means to carry out plan: No  Homicidal Ideation: No Plan Formed: No Patient has means to carry out plan: No  ROS  Psychiatric: Agitation: No Hallucination: No Depressed Mood: No Insomnia: No Hypersomnia: No Altered Concentration: No Feels Worthless: No Grandiose Ideas: No Belief In Special Powers: No New/Increased Substance Abuse: No Compulsions: No  Neurologic: Headache: No Seizure: No Paresthesias: No  Medical History:  Patient has a hypertension, hyperlipidemia, type 2 diabetes mellitus, hypothyroidism and obesity.    Outpatient Encounter Prescriptions as of 12/16/2014  Medication Sig  . albuterol (VENTOLIN HFA) 108 (90 BASE) MCG/ACT inhaler Inhale 1-2 puffs into the lungs every 6 (six) hours as needed for wheezing or shortness of breath.  Marland Kitchen amLODipine (NORVASC) 10 MG tablet Take 1 tablet (10 mg total) by mouth daily.  Marland Kitchen atorvastatin (LIPITOR) 10 MG tablet Take 1 tablet (10 mg total) by mouth daily.  . B Complex-C (B-COMPLEX WITH VITAMIN C) tablet Take 1 tablet by mouth daily.  . carvedilol (COREG) 12.5 MG tablet Take 1 tablet (12.5 mg total) by  mouth 2 (two) times daily with a meal.  . cetirizine (ZYRTEC) 10 MG tablet TAKE ONE TABLET BY MOUTH ONCE DAILY  . fluticasone (FLONASE) 50 MCG/ACT nasal spray Place 2 sprays into both nostrils daily.  . Fluticasone-Salmeterol (ADVAIR) 250-50 MCG/DOSE AEPB Inhale 1 puff into the lungs 2 (two) times daily.  . haloperidol (HALDOL) 2 MG tablet Take 1 tablet (2 mg total) by mouth at bedtime.  . hydrochlorothiazide (HYDRODIURIL) 25 MG tablet Take 1 tablet (25 mg total) by mouth daily.  Marland Kitchen levothyroxine (SYNTHROID, LEVOTHROID) 50 MCG tablet Take 1 tablet (50 mcg total) by mouth daily before breakfast.  . lisinopril (PRINIVIL,ZESTRIL) 40 MG tablet Take 1 tablet (40 mg total) by mouth daily.  . Omega-3 Fatty Acids (FISH OIL PO) Take 1 capsule by mouth daily.  . potassium chloride SA (K-DUR,KLOR-CON) 20 MEQ tablet Take 1 tablet (20 mEq total) by mouth 2 (two) times daily.  Marland Kitchen terbinafine (LAMISIL) 250 MG tablet Take 1 tablet (250 mg total) by mouth daily.  Marland Kitchen tetrahydrozoline (VISINE) 0.05 % ophthalmic solution Place 2 drops into both eyes.  . [DISCONTINUED] haloperidol (HALDOL) 2 MG tablet Take 1 tablet (2 mg total) by mouth at bedtime.    Past Psychiatric History/Hospitalization(s): Patient has history of paranoia and delusions.  She has one hospitalization at [facility]  She took overdose on her medication.  She was noncompliant with medication and required admission 4 months later.  She had tried Abilify with good response however at that time she could not  afford it.  Patient denies any history of physical sexual verbal and emotional abuse. Anxiety: Yes Bipolar Disorder: No Depression: Yes Mania: No Psychosis: No Schizophrenia: Yes Personality Disorder: No Hospitalization for psychiatric illness: Yes History of Electroconvulsive Shock Therapy: No Prior Suicide Attempts: Yes  Physical Exam: Constitutional:  BP 128/84 mmHg  Pulse 82  Ht 5\' 4"  (1.626 m)  Wt 241 lb (109.317 kg)  BMI  41.35 kg/m2 she was reluctant to get blood pressure taken.  Recent Results (from the past 2160 hour(s))  CBC     Status: Abnormal   Collection Time: 10/21/14  9:27 AM  Result Value Ref Range   WBC 9.2 4.0 - 10.5 K/uL   RBC 4.21 3.87 - 5.11 Mil/uL   Platelets 290.0 150.0 - 400.0 K/uL   Hemoglobin 11.4 (L) 12.0 - 15.0 g/dL   HCT 35.0 (L) 36.0 - 46.0 %   MCV 83.2 78.0 - 100.0 fl   MCHC 32.5 30.0 - 36.0 g/dL   RDW 15.0 11.5 - 15.5 %  Comprehensive metabolic panel     Status: Abnormal   Collection Time: 10/21/14  9:27 AM  Result Value Ref Range   Sodium 142 135 - 145 mEq/L   Potassium 4.1 3.5 - 5.1 mEq/L   Chloride 107 96 - 112 mEq/L   CO2 23 19 - 32 mEq/L   Glucose, Bld 101 (H) 70 - 99 mg/dL   BUN 13 6 - 23 mg/dL   Creatinine, Ser 1.2 0.4 - 1.2 mg/dL   Total Bilirubin 0.3 0.2 - 1.2 mg/dL   Alkaline Phosphatase 87 39 - 117 U/L   AST 23 0 - 37 U/L   ALT 17 0 - 35 U/L   Total Protein 7.8 6.0 - 8.3 g/dL   Albumin 4.3 3.5 - 5.2 g/dL   Calcium 9.8 8.4 - 10.5 mg/dL   GFR 62.14 >60.00 mL/min  TSH     Status: None   Collection Time: 10/21/14  9:27 AM  Result Value Ref Range   TSH 2.70 0.35 - 4.50 uIU/mL  T4, free     Status: None   Collection Time: 10/21/14  9:27 AM  Result Value Ref Range   Free T4 0.90 0.60 - 1.60 ng/dL  Lipid panel     Status: None   Collection Time: 10/21/14  9:27 AM  Result Value Ref Range   Cholesterol 169 0 - 200 mg/dL    Comment: ATP III Classification       Desirable:  < 200 mg/dL               Borderline High:  200 - 239 mg/dL          High:  > = 240 mg/dL   Triglycerides 97.0 0.0 - 149.0 mg/dL    Comment: Normal:  <150 mg/dLBorderline High:  150 - 199 mg/dL   HDL 70.60 >39.00 mg/dL   VLDL 19.4 0.0 - 40.0 mg/dL   LDL Cholesterol 79 0 - 99 mg/dL   Total CHOL/HDL Ratio 2     Comment:                Men          Women1/2 Average Risk     3.4          3.3Average Risk          5.0          4.42X Average Risk          9.6  7.13X Average Risk           15.0          11.0                       NonHDL 98.40     Comment: NOTE:  Non-HDL goal should be 30 mg/dL higher than patient's LDL goal (i.e. LDL goal of < 70 mg/dL, would have non-HDL goal of < 100 mg/dL)  Hemoglobin A1c     Status: None   Collection Time: 10/21/14  9:27 AM  Result Value Ref Range   Hgb A1c MFr Bld 6.4 4.6 - 6.5 %    Comment: Glycemic Control Guidelines for People with Diabetes:Non Diabetic:  <6%Goal of Therapy: <7%Additional Action Suggested:  >8%   Comprehensive metabolic panel     Status: Abnormal   Collection Time: 11/27/14 10:06 AM  Result Value Ref Range   Sodium 138 135 - 145 mEq/L   Potassium 3.3 (L) 3.5 - 5.1 mEq/L   Chloride 104 96 - 112 mEq/L   CO2 29 19 - 32 mEq/L   Glucose, Bld 123 (H) 70 - 99 mg/dL   BUN 17 6 - 23 mg/dL   Creatinine, Ser 1.2 0.4 - 1.2 mg/dL   Total Bilirubin 0.3 0.2 - 1.2 mg/dL   Alkaline Phosphatase 81 39 - 117 U/L   AST 21 0 - 37 U/L   ALT 16 0 - 35 U/L   Total Protein 7.4 6.0 - 8.3 g/dL   Albumin 3.7 3.5 - 5.2 g/dL   Calcium 9.0 8.4 - 10.5 mg/dL   GFR 60.91 >60.00 mL/min   General Appearance: alert, oriented, no acute distress, well nourished and obese  Musculoskeletal: Strength & Muscle Tone: within normal limits Gait & Station: normal Patient leans: N/A  Psychiatric: Speech (describe rate, volume, coherence, spontaneity, and abnormalities if any): Clear and coherent.  Normal tone and volume.  Thought Process (describe rate, content, abstract reasoning, and computation): Logical and goal-directed.      Associations: Relevant and Intact  Thoughts: normal  Mental Status: Orientation: oriented to person, place, time/date, situation, day of week and month of year Mood & Affect: normal affect Attention Span & Concentration: Fair  Established Problem, Stable/Improving (1), Review of Psycho-Social Stressors (1), Review or order clinical lab tests (1), Review of Last Therapy Session (1) and Review of Medication Regimen &  Side Effects (2)  Assessment: Axis I: Schizophrenia chronic paranoid type, anxiety disorder  Axis II: Deferred  Axis III: See medical history  Axis IV: Mild  Axis V: 60-65   Plan:  I review her blood work and her current medication.  Patient is  stable on Haldol 2 mg at bedtime.  Patient does not have any tremors or shakes.  Encourage weight loss and  recommended regular exercise.  Recommended to call us back if she has any question or any concern.  Followup in 3 months.  Jahmeir Geisen T., MD 12/16/2014

## 2015-01-21 ENCOUNTER — Other Ambulatory Visit: Payer: Self-pay | Admitting: Internal Medicine

## 2015-01-31 ENCOUNTER — Other Ambulatory Visit: Payer: Self-pay | Admitting: Internal Medicine

## 2015-02-17 ENCOUNTER — Other Ambulatory Visit: Payer: Self-pay | Admitting: Internal Medicine

## 2015-02-20 ENCOUNTER — Encounter: Payer: Self-pay | Admitting: Internal Medicine

## 2015-02-20 ENCOUNTER — Ambulatory Visit (INDEPENDENT_AMBULATORY_CARE_PROVIDER_SITE_OTHER): Payer: Medicare Other | Admitting: Internal Medicine

## 2015-02-20 VITALS — BP 124/80 | HR 71 | Temp 98.4°F | Wt 235.5 lb

## 2015-02-20 DIAGNOSIS — N63 Unspecified lump in breast: Secondary | ICD-10-CM | POA: Diagnosis not present

## 2015-02-20 DIAGNOSIS — N6320 Unspecified lump in the left breast, unspecified quadrant: Secondary | ICD-10-CM

## 2015-02-20 NOTE — Progress Notes (Signed)
Subjective:    Patient ID: Maria Franklin, female    DOB: 07-28-1961, 54 y.o.   MRN: [id number]  HPI  Pt presents to the clinic today with c/o a lump in her left breast. She noticed this 4-5 days ago. The area is not tender. She has not noticed any drainage from the nipple or breast asymmetry. She has not taken anything OTC. Her last mammogram was 07/2014-normal. She reports she has a aunt who has had breast cancer in the past.  Review of Systems  Past Medical History  Diagnosis Date  . Asthma   . Chicken pox   . Allergy   . Hypertension   . Hyperlipidemia   . Thyroid disease     Current Outpatient Prescriptions  Medication Sig Dispense Refill  . albuterol (VENTOLIN HFA) 108 (90 BASE) MCG/ACT inhaler Inhale 1-2 puffs into the lungs every 6 (six) hours as needed for wheezing or shortness of breath. 18 g 5  . amLODipine (NORVASC) 10 MG tablet Take 1 tablet (10 mg total) by mouth daily. 90 tablet 1  . atorvastatin (LIPITOR) 10 MG tablet Take 1 tablet (10 mg total) by mouth daily. 90 tablet 1  . B Complex-C (B-COMPLEX WITH VITAMIN C) tablet Take 1 tablet by mouth daily. 90 tablet 0  . carvedilol (COREG) 12.5 MG tablet Take 1 tablet (12.5 mg total) by mouth 2 (two) times daily with a meal. 180 tablet 1  . cetirizine (ZYRTEC) 10 MG tablet TAKE ONE TABLET BY MOUTH ONCE DAILY 90 tablet 1  . fluticasone (FLONASE) 50 MCG/ACT nasal spray Place 2 sprays into both nostrils daily. 16 g 6  . Fluticasone-Salmeterol (ADVAIR) 250-50 MCG/DOSE AEPB Inhale 1 puff into the lungs 2 (two) times daily. 120 each 1  . haloperidol (HALDOL) 2 MG tablet Take 1 tablet (2 mg total) by mouth at bedtime. 90 tablet 0  . hydrochlorothiazide (HYDRODIURIL) 25 MG tablet TAKE ONE TABLET BY MOUTH ONCE DAILY 90 tablet 1  . levothyroxine (SYNTHROID, LEVOTHROID) 50 MCG tablet Take 1 tablet (50 mcg total) by mouth daily before breakfast. 90 tablet 1  . lisinopril (PRINIVIL,ZESTRIL) 40 MG tablet Take 1 tablet (40 mg total) by  mouth daily. 90 tablet 1  . Omega-3 Fatty Acids (FISH OIL PO) Take 1 capsule by mouth daily.    . potassium chloride SA (K-DUR,KLOR-CON) 20 MEQ tablet Take 1 tablet (20 mEq total) by mouth 2 (two) times daily. 180 tablet 1  . terbinafine (LAMISIL) 250 MG tablet Take 1 tablet (250 mg total) by mouth daily. 30 tablet 1  . tetrahydrozoline (VISINE) 0.05 % ophthalmic solution Place 2 drops into both eyes.     No current facility-administered medications for this visit.    No Known Allergies  Family History  Problem Relation Age of Onset  . Mental illness Mother   . Hypertension Mother   . Schizophrenia Mother   . Cancer Father     LUNG  . Cancer Sister     BREAST  . Mental illness Brother   . Hypertension Brother   . Schizophrenia Brother   . Heart disease Maternal Grandmother   . Schizophrenia Brother     History   Social History  . Marital Status: Married    Spouse Name: N/A  . Number of Children: N/A  . Years of Education: N/A   Occupational History  . Not on file.   Social History Main Topics  . Smoking status: Former Research scientist (life sciences)  . Smokeless tobacco: Not on  file  . Alcohol Use: No  . Drug Use: No  . Sexual Activity: Not on file   Other Topics Concern  . Not on file   Social History Narrative   3 [place]     Constitutional: Denies fever, malaise, fatigue, headache or abrupt weight changes.  Respiratory: Denies difficulty breathing, shortness of breath, cough or sputum production.   Cardiovascular: Denies chest pain, chest tightness, palpitations or swelling in the hands or feet.  Gastrointestinal: Denies abdominal pain, bloating, constipation, diarrhea or blood in the stool.  Skin: Pt reports a lump in her left breast. Denies redness, rashes, lesions or ulcercations.   No other specific complaints in a complete review of systems (except as listed in HPI above).     Objective:   Physical Exam  BP  124/80 mmHg  Pulse 71  Temp(Src) 98.4 F (36.9 C) (Oral)  Wt 235 lb 8 oz (106.822 kg)  SpO2 98%  LMP 01/18/2015 Wt Readings from Last 3 Encounters:  02/20/15 235 lb 8 oz (106.822 kg)  12/16/14 241 lb (109.317 kg)  10/21/14 236 lb (107.049 kg)    General: Appears their stated age, well developed, well nourished in NAD. Skin: Warm, dry and intact. Breast symmetrical. Walnut size mass noted at 5 oclock in the left breast, hard, non tender. Cardiovascular: Normal rate and rhythm. S1,S2 noted.  No murmur, rubs or gallops noted. Pulmonary/Chest: Normal effort and positive vesicular breath sounds. No respiratory distress. No wheezes, rales or ronchi noted.   BMET    Component Value Date/Time   NA 138 11/27/2014 1006   K 3.3* 11/27/2014 1006   CL 104 11/27/2014 1006   CO2 29 11/27/2014 1006   GLUCOSE 123* 11/27/2014 1006   BUN 17 11/27/2014 1006   CREATININE 1.2 11/27/2014 1006   CALCIUM 9.0 11/27/2014 1006   GFRNONAA 60* 01/01/2014 0330   GFRAA 70* 01/01/2014 0330    Lipid Panel     Component Value Date/Time   CHOL 169 10/21/2014 0927   TRIG 97.0 10/21/2014 0927   HDL 70.60 10/21/2014 0927   CHOLHDL 2 10/21/2014 0927   VLDL 19.4 10/21/2014 0927   LDLCALC 79 10/21/2014 0927    CBC    Component Value Date/Time   WBC 9.2 10/21/2014 0927   RBC 4.21 10/21/2014 0927   HGB 11.4* 10/21/2014 0927   HCT 35.0* 10/21/2014 0927   PLT 290.0 10/21/2014 0927   MCV 83.2 10/21/2014 0927   MCH 28.1 01/01/2014 0330   MCHC 32.5 10/21/2014 0927   RDW 15.0 10/21/2014 0927   LYMPHSABS 1.7 01/14/2014 1224   MONOABS 0.3 01/14/2014 1224   EOSABS 0.1 01/14/2014 1224   BASOSABS 0.0 01/14/2014 1224    Hgb A1C Lab Results  Component Value Date   HGBA1C 6.4 10/21/2014         Assessment & Plan:   Mass of left breast, 5 o'clock:  Will obtain diagnostic mammogram and ultrasound of left breast  Plan on follow up after you appt with the GI breast center to discuss results

## 2015-02-20 NOTE — Progress Notes (Signed)
Pre visit review using our clinic review tool, if applicable. No additional management support is needed unless otherwise documented below in the visit note. 

## 2015-02-20 NOTE — Patient Instructions (Signed)
Breast Scan Breast scan is procedure done to examine dense breast tissue, which is difficult in a normal mammogram. It is used in women with breast lesions from fibrocystic disease, fibroadenoma, and fat necrosis. It also is used to determine the course of treatment for breast cancer. LET [facility] CARE PROVIDER KNOW ABOUT:  Any allergies you have.  All medicines you are taking, including vitamins, herbs, eye drops, creams, and over-the-counter medicines.  Previous problems you or members of your family have had with the use of anesthetics.  Any blood disorders you have.  Previous surgeries you have had.  Medical conditions you have.  Pregnancy or the possibility that you may be pregnant. RISKS AND COMPLICATIONS Generally, this is a safe procedure. However, as with any procedure, complications can occur. Possible complications include:   Slight discomfort from injection of radioactive substance.  Allergic reaction to contrast or radioactive substance used in exam. BEFORE THE PROCEDURE No fasting or sedation is required. PROCEDURE   You will be asked to remove all jewelry and clothing from the waist up.  An IV tube will be inserted in your arm or hand opposite the side of the breast to be examined. If both breasts are being evaluated, the IV tube may be inserted into a vein in the foot.  You will be positioned face down on a table. The breast to be imaged will be placed through an opening in the table.  The radioactive agent will be injected into the IV tube. You may experience a slight metallic taste after the injection.  Imaging will begin a few minutes after the injection. A scanner will be placed over the breast and will record the radiation given off.  You may also be asked to get into different positions during the scan.  When the scan is complete, the IV tube is removed. AFTER THE PROCEDURE  You will be asked to get up slowly from the scanner to avoid  light-headedness from lying flat during the procedure.  Drink plenty of fluids to help flush the remaining radioactive agent from your body. Document Released: 12/17/2004 Document Revised: 11/27/2013 Document Reviewed: 07/30/2013 [facility] Patient Information 2015 [place], [place]. This information is not intended to replace advice given to you by your health care provider. Make sure you discuss any questions you have with your health care provider.

## 2015-02-25 ENCOUNTER — Ambulatory Visit
Admission: RE | Admit: 2015-02-25 | Discharge: 2015-02-25 | Disposition: A | Discharge status: Home / Self Care | Payer: Medicare Other | Source: Ambulatory Visit | Attending: Internal Medicine | Admitting: Internal Medicine

## 2015-02-25 DIAGNOSIS — N6320 Unspecified lump in the left breast, unspecified quadrant: Secondary | ICD-10-CM

## 2015-02-25 DIAGNOSIS — N6002 Solitary cyst of left breast: Secondary | ICD-10-CM | POA: Diagnosis not present

## 2015-03-17 ENCOUNTER — Ambulatory Visit (INDEPENDENT_AMBULATORY_CARE_PROVIDER_SITE_OTHER): Payer: Medicare Other | Admitting: Psychiatry

## 2015-03-17 ENCOUNTER — Encounter (HOSPITAL_COMMUNITY): Payer: Self-pay | Admitting: Psychiatry

## 2015-03-17 VITALS — BP 112/75 | HR 74 | Ht 64.0 in | Wt 233.4 lb

## 2015-03-17 DIAGNOSIS — F200 Paranoid schizophrenia: Secondary | ICD-10-CM | POA: Diagnosis not present

## 2015-03-17 DIAGNOSIS — F419 Anxiety disorder, unspecified: Secondary | ICD-10-CM | POA: Diagnosis not present

## 2015-03-17 MED ORDER — HALOPERIDOL 2 MG PO TABS: 2.0000 mg | ORAL_TABLET | Freq: Every day | ORAL | Status: DC

## 2015-03-17 NOTE — Progress Notes (Signed)
Maria Franklin  Maria Franklin [id number] 53 y.o.  03/17/2015 9:26 AM  Chief Complaint:  Medication management and followup.      History of Present Illness: Chetara came for her followup appointment.  She is compliant with her medication and denies any side effects.  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  She enjoys her time and denies any irritability, anger or any mood swing her sleep is good.  Her appetite is okay.  She denies any paranoia or any hallucination.  She has no tremors or shakes.  She still has some social isolation and she does not leave her house unless it is important.  She denies any feeling of hopelessness or worthlessness.  Recently she had mammogram which is normal.  She lives with her husband is very supportive.  Her vitals are stable.  Suicidal Ideation: No Plan Formed: No Patient has means to carry out plan: No  Homicidal Ideation: No Plan Formed: No Patient has means to carry out plan: No  ROS  Psychiatric: Agitation: No Hallucination: No Depressed Mood: No Insomnia: No Hypersomnia: No Altered Concentration: No Feels Worthless: No Grandiose Ideas: No Belief In Special Powers: No New/Increased Substance Abuse: No Compulsions: No  Neurologic: Headache: No Seizure: No Paresthesias: No  Medical History:  Patient has a hypertension, hyperlipidemia, type 2 diabetes mellitus, hypothyroidism and obesity.    Outpatient Encounter Prescriptions as of 03/17/2015  Medication Sig  . albuterol (VENTOLIN HFA) 108 (90 BASE) MCG/ACT inhaler Inhale 1-2 puffs into the lungs every 6 (six) hours as needed for wheezing or shortness of breath.  Marland Kitchen amLODipine (NORVASC) 10 MG tablet Take 1 tablet (10 mg total) by mouth daily.  Marland Kitchen atorvastatin (LIPITOR) 10 MG tablet Take 1 tablet (10 mg total) by mouth daily.  . B Complex-C (B-COMPLEX WITH VITAMIN C) tablet Take 1 tablet by mouth daily.   . carvedilol (COREG) 12.5 MG tablet Take 1 tablet (12.5 mg total) by mouth 2 (two) times daily with a meal.  . cetirizine (ZYRTEC) 10 MG tablet TAKE ONE TABLET BY MOUTH ONCE DAILY  . fluticasone (FLONASE) 50 MCG/ACT nasal spray Place 2 sprays into both nostrils daily.  . Fluticasone-Salmeterol (ADVAIR) 250-50 MCG/DOSE AEPB Inhale 1 puff into the lungs 2 (two) times daily.  . haloperidol (HALDOL) 2 MG tablet Take 1 tablet (2 mg total) by mouth at bedtime.  . hydrochlorothiazide (HYDRODIURIL) 25 MG tablet TAKE ONE TABLET BY MOUTH ONCE DAILY  . levothyroxine (SYNTHROID, LEVOTHROID) 50 MCG tablet Take 1 tablet (50 mcg total) by mouth daily before breakfast.  . lisinopril (PRINIVIL,ZESTRIL) 40 MG tablet Take 1 tablet (40 mg total) by mouth daily.  . Omega-3 Fatty Acids (FISH OIL PO) Take 1 capsule by mouth daily.  . potassium chloride SA (K-DUR,KLOR-CON) 20 MEQ tablet Take 1 tablet (20 mEq total) by mouth 2 (two) times daily.  . [DISCONTINUED] haloperidol (HALDOL) 2 MG tablet Take 1 tablet (2 mg total) by mouth at bedtime.  Marland Kitchen tetrahydrozoline (VISINE) 0.05 % ophthalmic solution Place 2 drops into both eyes.    Past Psychiatric History/Hospitalization(s): Patient has history of paranoia and delusions.  She has one hospitalization at [facility].  She took overdose on her medication.  She was noncompliant with medication and required admission 4 months later.  She had tried Abilify with good response however at that time she could not afford it.  Patient denies any history of  physical sexual verbal and emotional abuse. Anxiety: Yes Bipolar Disorder: No Depression: Yes Mania: No Psychosis: No Schizophrenia: Yes Personality Disorder: No Hospitalization for psychiatric illness: Yes History of Electroconvulsive Shock Therapy: No Prior Suicide Attempts: Yes  Physical Exam: Constitutional:  BP 112/75 mmHg  Pulse 74  Ht 5\' 4"  (1.626 m)  Wt 233 lb 6.4 oz (105.87 kg)  BMI 40.04 kg/m2  LMP  03/10/2015 (Approximate) she was reluctant to get blood pressure taken.  No results found for this or any previous visit (from the past 2160 hour(s)). General Appearance: alert, oriented, no acute distress, well nourished and obese  Musculoskeletal: Strength & Muscle Tone: within normal limits Gait & Station: normal Patient leans: N/A  Mental status examination: Patient is casually dressed and groomed.  She maintained good eye contact.  Her speech is clear, fluent and coherent.  Her thought process slow but logical and goal-directed.  She denies any auditory or visual hallucination.  She denies any active or passive suicidal parts or homicidal thought.  There were no delusions, paranoia or any obsessive thoughts.  Her psychomotor activity is slow.  She has no tremors, shakes or any EPS.  Her attention and concentration is fair.  Her cognition is good.  She is alert and oriented 3.  Her insight judgment and impulse control is okay.    Established Problem, Stable/Improving (1), Review of Psycho-Social Stressors (1), Review of Last Therapy Session (1) and Review of Medication Regimen & Side Effects (2)  Assessment: Axis I: Schizophrenia chronic paranoid type, anxiety disorder  Axis II: Deferred  Past Medical History  Diagnosis Date  . Asthma   . Chicken pox   . Allergy   . Hypertension   . Hyperlipidemia   . Thyroid disease      Plan:  Patient is doing better on her current medication.  I will continue Haldol 2 mg at bedtime.  She has no side effects.  Discussed medication side effects and benefits.  Recommended to call us back if she has any question, concern or if she feels worsening of the symptom.  Follow-up in 3 months.  Chrissi Crow T., MD 03/17/2015

## 2015-03-28 ENCOUNTER — Telehealth (HOSPITAL_COMMUNITY): Payer: Self-pay | Admitting: *Deleted

## 2015-03-28 NOTE — Telephone Encounter (Signed)
R/S appointment

## 2015-04-21 ENCOUNTER — Ambulatory Visit (INDEPENDENT_AMBULATORY_CARE_PROVIDER_SITE_OTHER): Payer: Medicare Other | Admitting: Internal Medicine

## 2015-04-21 ENCOUNTER — Encounter: Payer: Self-pay | Admitting: Internal Medicine

## 2015-04-21 VITALS — BP 122/76 | HR 78 | Temp 98.8°F | Wt 224.0 lb

## 2015-04-21 DIAGNOSIS — J4530 Mild persistent asthma, uncomplicated: Secondary | ICD-10-CM

## 2015-04-21 DIAGNOSIS — E669 Obesity, unspecified: Secondary | ICD-10-CM

## 2015-04-21 DIAGNOSIS — J309 Allergic rhinitis, unspecified: Secondary | ICD-10-CM

## 2015-04-21 DIAGNOSIS — K122 Cellulitis and abscess of mouth: Secondary | ICD-10-CM

## 2015-04-21 DIAGNOSIS — N393 Stress incontinence (female) (male): Secondary | ICD-10-CM | POA: Insufficient documentation

## 2015-04-21 DIAGNOSIS — E039 Hypothyroidism, unspecified: Secondary | ICD-10-CM

## 2015-04-21 DIAGNOSIS — E785 Hyperlipidemia, unspecified: Secondary | ICD-10-CM | POA: Diagnosis not present

## 2015-04-21 DIAGNOSIS — E119 Type 2 diabetes mellitus without complications: Secondary | ICD-10-CM | POA: Diagnosis not present

## 2015-04-21 DIAGNOSIS — I10 Essential (primary) hypertension: Secondary | ICD-10-CM

## 2015-04-21 DIAGNOSIS — F200 Paranoid schizophrenia: Secondary | ICD-10-CM

## 2015-04-21 LAB — COMPREHENSIVE METABOLIC PANEL
ALT: 15 U/L (ref 0–35)
AST: 18 U/L (ref 0–37)
Albumin: 3.8 g/dL (ref 3.5–5.2)
Alkaline Phosphatase: 80 U/L (ref 39–117)
BILIRUBIN TOTAL: 0.3 mg/dL (ref 0.2–1.2)
BUN: 15 mg/dL (ref 6–23)
CO2: 28 meq/L (ref 19–32)
CREATININE: 1.21 mg/dL — AB (ref 0.40–1.20)
Calcium: 9.6 mg/dL (ref 8.4–10.5)
Chloride: 104 mEq/L (ref 96–112)
GFR: 59.66 mL/min — ABNORMAL LOW (ref >60.00)
GLUCOSE: 104 mg/dL — AB (ref 70–99)
Potassium: 3.8 mEq/L (ref 3.5–5.1)
Sodium: 137 mEq/L (ref 135–145)
Total Protein: 7.3 g/dL (ref 6.0–8.3)

## 2015-04-21 LAB — LIPID PANEL
CHOLESTEROL: 149 mg/dL (ref 0–200)
HDL: 56.4 mg/dL (ref >39.00)
LDL Cholesterol: 81 mg/dL (ref 0–99)
NONHDL: 92.6
Total CHOL/HDL Ratio: 3
Triglycerides: 58 mg/dL (ref 0.0–149.0)
VLDL: 11.6 mg/dL (ref 0.0–40.0)

## 2015-04-21 LAB — CBC
HEMATOCRIT: 33 % — AB (ref 36.0–46.0)
Hemoglobin: 11 g/dL — ABNORMAL LOW (ref 12.0–15.0)
MCHC: 33.2 g/dL (ref 30.0–36.0)
MCV: 82.7 fl (ref 78.0–100.0)
Platelets: 258 10*3/uL (ref 150.0–400.0)
RBC: 3.99 Mil/uL (ref 3.87–5.11)
RDW: 15.5 % (ref 11.5–15.5)
WBC: 6.6 10*3/uL (ref 4.0–10.5)

## 2015-04-21 LAB — MICROALBUMIN / CREATININE URINE RATIO
Creatinine,U: 130 mg/dL
Microalb Creat Ratio: 0.5 mg/g (ref 0.0–30.0)
Microalb, Ur: <0.7 mg/dL (ref 0.0–1.9)

## 2015-04-21 LAB — HEMOGLOBIN A1C: HEMOGLOBIN A1C: 6.2 % (ref 4.6–6.5)

## 2015-04-21 LAB — T4, FREE: FREE T4: 0.92 ng/dL (ref 0.60–1.60)

## 2015-04-21 LAB — TSH: TSH: 2.35 u[IU]/mL (ref 0.35–4.50)

## 2015-04-21 MED ORDER — PREDNISONE 10 MG PO TABS
ORAL_TABLET | ORAL | Status: DC
Start: 2015-04-21 — End: 2015-10-28

## 2015-04-21 MED ORDER — MOMETASONE FUROATE 50 MCG/ACT NA SUSP: 2.0000 | Freq: Every day | NASAL | Status: DC

## 2015-04-21 NOTE — Assessment & Plan Note (Signed)
She has lost 12 pounds since her last visit Encouraged her to work on diet and exercise

## 2015-04-21 NOTE — Assessment & Plan Note (Signed)
Controlled with Advair Rare albuterol use

## 2015-04-21 NOTE — Assessment & Plan Note (Signed)
No current issues

## 2015-04-21 NOTE — Patient Instructions (Signed)

## 2015-04-21 NOTE — Addendum Note (Signed)
Addended by: Jearld Fenton on: 04/21/2015 11:09 AM   Modules accepted: Level of Service

## 2015-04-21 NOTE — Assessment & Plan Note (Signed)
Diet controlled Will check A1C and microalbumin today Foot exam today Flu and Pneumovax UTD She is past due for an eye exam, advised her to call and schedule

## 2015-04-21 NOTE — Assessment & Plan Note (Signed)
Well controlled on multiple medications She is having some issue with stress incontinence Discussed cutting the HCTZ in half. She is not interested in this because she is concerned her BP will skyrocket Will check CBC and CMET today

## 2015-04-21 NOTE — Assessment & Plan Note (Signed)
She is not interested in surgical intervention at this time Advised her to try a voiding schedule, every 2 hours

## 2015-04-21 NOTE — Assessment & Plan Note (Signed)
Stable on Haldol She will continue to follow with psychiatry

## 2015-04-21 NOTE — Progress Notes (Signed)
Pre visit review using our clinic review tool, if applicable. No additional management support is needed unless otherwise documented below in the visit note. 

## 2015-04-21 NOTE — Progress Notes (Addendum)
Subjective:    Patient ID: Maria Franklin, female    DOB: May 30, 1961, 54 y.o.   MRN: [id number]  HPI  Pt presents to the clinic today for 6 months follow up of chronic medical conditions.  Allergic Rhinitis: Her symptoms are usually worse in the spring. She takes Zyrtec and Flonase daily. She feels like her symptoms have been worse over the last month and the medications are not as helpful as they have been in the past. She denies fever, chills or body aches. She reports that she has polyps in her nose. She has seen ENT in the past for the same. She has had a polypectomy x 2 but they keep coming back.  Asthma: She takes Advair daily. She is using her Albuterol about 1 time per month.  [place]wig angina: Denies chest pain.  DM 2: her last A1C was 6.4%. She does not test her sugars. Her DM 2 is diet controlled. Her last eye exam was 2014. Flu 10/2014. Pneumovax 10/2014.  HLD: She denies myalgias on Lipitor. She takes Fish Oil as well. Her last LDL was 79. She does try to consume a low fat diet.  HTN: BP well controlled on Norvasc, Coreg, HCTZ and Lisinopril. She is also taking a potassium supplement daily. Her BP today is 122/76.  Hypothyroidism: Her last TSH and T4 were WNL. She denies s/s of hypothyroidism on current dose of Synthroid.  Obesity: She has lost 12 lbs since her last visit. Her BMI today is 38.43.She reports she is eating smaller portions, more fruits and vegetables. She walks about 2-3 days per week.  Paranoid Schizophrenia: She takes Haldol daily. She follows with Dr. Adele Schilder, at [facility].  She also c/o difficulty holding her urine. She leaks when she cough, sneezes or blows her nose. It seems worse just prior to her period. She has not tried anything OTC.  Review of Systems      Past Medical History  Diagnosis Date  . Asthma   . Chicken pox   . Allergy   . Hypertension   . Hyperlipidemia   . Thyroid disease     Current Outpatient Prescriptions    Medication Sig Dispense Refill  . albuterol (VENTOLIN HFA) 108 (90 BASE) MCG/ACT inhaler Inhale 1-2 puffs into the lungs every 6 (six) hours as needed for wheezing or shortness of breath. 18 g 5  . amLODipine (NORVASC) 10 MG tablet Take 1 tablet (10 mg total) by mouth daily. 90 tablet 1  . atorvastatin (LIPITOR) 10 MG tablet Take 1 tablet (10 mg total) by mouth daily. 90 tablet 1  . B Complex-C (B-COMPLEX WITH VITAMIN C) tablet Take 1 tablet by mouth daily. 90 tablet 0  . carvedilol (COREG) 12.5 MG tablet Take 1 tablet (12.5 mg total) by mouth 2 (two) times daily with a meal. 180 tablet 1  . cetirizine (ZYRTEC) 10 MG tablet TAKE ONE TABLET BY MOUTH ONCE DAILY 90 tablet 1  . fluticasone (FLONASE) 50 MCG/ACT nasal spray Place 2 sprays into both nostrils daily. 16 g 6  . Fluticasone-Salmeterol (ADVAIR) 250-50 MCG/DOSE AEPB Inhale 1 puff into the lungs 2 (two) times daily. 120 each 1  . haloperidol (HALDOL) 2 MG tablet Take 1 tablet (2 mg total) by mouth at bedtime. 90 tablet 0  . hydrochlorothiazide (HYDRODIURIL) 25 MG tablet TAKE ONE TABLET BY MOUTH ONCE DAILY 90 tablet 1  . levothyroxine (SYNTHROID, LEVOTHROID) 50 MCG tablet Take 1 tablet (50 mcg total) by mouth daily before breakfast.  90 tablet 1  . lisinopril (PRINIVIL,ZESTRIL) 40 MG tablet Take 1 tablet (40 mg total) by mouth daily. 90 tablet 1  . Omega-3 Fatty Acids (FISH OIL PO) Take 1 capsule by mouth daily.    . potassium chloride SA (K-DUR,KLOR-CON) 20 MEQ tablet Take 1 tablet (20 mEq total) by mouth 2 (two) times daily. 180 tablet 1  . tetrahydrozoline (VISINE) 0.05 % ophthalmic solution Place 2 drops into both eyes.     No current facility-administered medications for this visit.    Allergies  Allergen Reactions  . Pollen Extract Cough    Family History  Problem Relation Age of Onset  . Mental illness Mother   . Hypertension Mother   . Schizophrenia Mother   . Depression Mother   . Cancer Father     LUNG  . Cancer Sister      BREAST  . Mental illness Brother   . Hypertension Brother   . Schizophrenia Brother   . Depression Brother   . Heart disease Maternal Grandmother   . Depression Maternal Grandmother   . Schizophrenia Maternal Grandmother   . Schizophrenia Brother   . Schizophrenia Maternal Uncle     History   Social History  . Marital Status: Married    Spouse Name: N/A  . Number of Children: N/A  . Years of Education: N/A   Occupational History  . Not on file.   Social History Main Topics  . Smoking status: Former Research scientist (life sciences)  . Smokeless tobacco: Not on file  . Alcohol Use: No  . Drug Use: No  . Sexual Activity: Not Currently   Other Topics Concern  . Not on file   Social History Narrative   3 [place]     Constitutional: Denies fever, malaise, fatigue, headache or abrupt weight changes.  HEENT: Pt reports nasal congestion. Denies eye pain, eye redness, ear pain, ringing in the ears, wax buildup, runny nose, bloody nose, or sore throat. Respiratory: Denies difficulty breathing, shortness of breath, cough or sputum production.   Cardiovascular: Denies chest pain, chest tightness, palpitations or swelling in the hands or feet.  Gastrointestinal: Denies abdominal pain, bloating, constipation, diarrhea or blood in the stool.  GU: Pt reports stress incontinence. Denies urgency, frequency, pain with urination, burning sensation, blood in urine, odor or discharge. Musculoskeletal: Denies decrease in range of motion, difficulty with gait, muscle pain or joint pain and swelling.  Skin: Denies redness, rashes, lesions or ulcercations.  Neurological: Denies dizziness, difficulty with memory, difficulty with speech or problems with balance and coordination.  Psych: Denies anxiety, depression, SI/HI.  No other specific complaints in a complete review of systems (except as listed in HPI above).  Objective:   Physical Exam  BP 122/76  mmHg  Pulse 78  Temp(Src) 98.8 F (37.1 C) (Oral)  Wt 224 lb (101.606 kg)  SpO2 98% Wt Readings from Last 3 Encounters:  04/21/15 224 lb (101.606 kg)  03/17/15 233 lb 6.4 oz (105.87 kg)  02/20/15 235 lb 8 oz (106.822 kg)    General: Appears her stated age, obese in NAD. Skin: Warm, dry and intact. No rashes, lesions or ulcerations noted. HEENT: Head: normal shape and size, no sinus tenderness noted; Eyes: sclera white, no icterus, conjunctiva pink, PERRLA and EOMs intact;  Nose: mucosa pink and moist, swollen turbinates, Throat/Mouth: Teeth present, mucosa pink and moist, no exudate, lesions or ulcerations noted.  Neck: Neck supple, trachea midline. No massses, lumps  present. Thyromegaly noted. Cardiovascular: Normal rate and rhythm. S1,S2 noted.  No murmur, rubs or gallops noted. No JVD. Trace BLE edema. No carotid bruits noted. Pulmonary/Chest: Normal effort and positive vesicular breath sounds. No respiratory distress. No wheezes, rales or ronchi noted.  Abdomen: Soft and nontender. Normal bowel sounds, no bruits noted. No distention or masses noted. Liver, spleen and kidneys non palpable. No tenderness noted with palpation over the bladder. Neurological: Alert and oriented. Sensation intact to BLE Psychiatric: Mood and affect normal. She is pleasant.  BMET    Component Value Date/Time   NA 138 11/27/2014 1006   K 3.3* 11/27/2014 1006   CL 104 11/27/2014 1006   CO2 29 11/27/2014 1006   GLUCOSE 123* 11/27/2014 1006   BUN 17 11/27/2014 1006   CREATININE 1.2 11/27/2014 1006   CALCIUM 9.0 11/27/2014 1006   GFRNONAA 60* 01/01/2014 0330   GFRAA 70* 01/01/2014 0330    Lipid Panel     Component Value Date/Time   CHOL 169 10/21/2014 0927   TRIG 97.0 10/21/2014 0927   HDL 70.60 10/21/2014 0927   CHOLHDL 2 10/21/2014 0927   VLDL 19.4 10/21/2014 0927   LDLCALC 79 10/21/2014 0927    CBC    Component Value Date/Time   WBC 9.2 10/21/2014 0927   RBC 4.21 10/21/2014 0927   HGB  11.4* 10/21/2014 0927   HCT 35.0* 10/21/2014 0927   PLT 290.0 10/21/2014 0927   MCV 83.2 10/21/2014 0927   MCH 28.1 01/01/2014 0330   MCHC 32.5 10/21/2014 0927   RDW 15.0 10/21/2014 0927   LYMPHSABS 1.7 01/14/2014 1224   MONOABS 0.3 01/14/2014 1224   EOSABS 0.1 01/14/2014 1224   BASOSABS 0.0 01/14/2014 1224    Hgb A1C Lab Results  Component Value Date   HGBA1C 6.4 10/21/2014         Assessment & Plan:

## 2015-04-21 NOTE — Assessment & Plan Note (Signed)
Will check TSH and Free T4 today 

## 2015-04-21 NOTE — Assessment & Plan Note (Signed)
Seems worse lately but she thinks her nasal polyps are back She does not want to go back to ENT at this time because she reports she cannot afford another surgery Will switch Flonase to Nasonex, eRx sent Continue zyrtec daily eRx for low dose pred taper x 6 days

## 2015-04-21 NOTE — Assessment & Plan Note (Signed)
LDL at goal Will check Lipid profile and CMET today Encouraged her to continue to consume a low fat diet

## 2015-05-04 ENCOUNTER — Other Ambulatory Visit: Payer: Self-pay | Admitting: Internal Medicine

## 2015-05-24 IMAGING — CT CT NECK W/ CM
4 of 5 series · 15 of 33 positions shown, 18 images · IV contrast (OMNIPAQUE 300)
Comparison: None.

ADDENDUM:
The patient is on an ACE inhibitor and therefore it is possible that
inflammatory process represent noninfectious angio neurotic edema.
Infectious process still not excluded in proper clinical setting. If
the patient has progressive symptoms, close followup imaging may be
considered. Results discussed with Dr. Govind by phone 12/30/2013 1
p.m..
CLINICAL DATA: Redness under chin.  Jaw swelling.

EXAM:
CT NECK WITH CONTRAST
TECHNIQUE: Multidetector CT imaging of the neck was performed using the
standard protocol following the bolus administration of intravenous
contrast.
CONTRAST:  100mL OMNIPAQUE IOHEXOL 300 MG/ML  SOLN

[Series 5: neck with st · axial · 0.39mm/px · z∈[-210,-162]mm · 2 of 97 slices shown]
[im 25/97  bone]
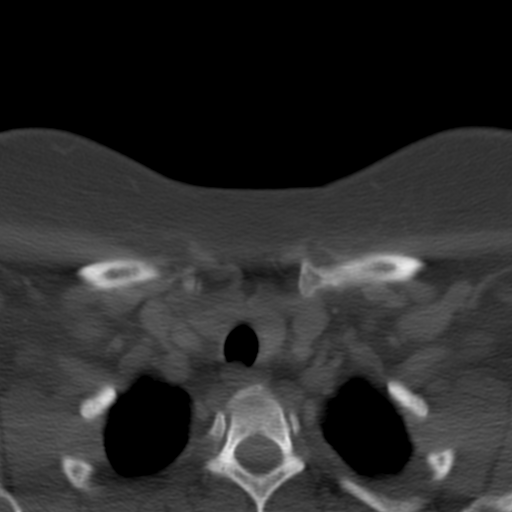
[im 49/97  bone]
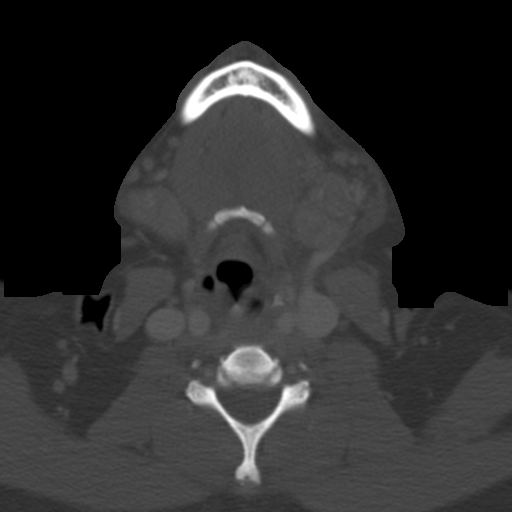

[Series 602: <mpr thick range> · axial · 0.39mm/px · z∈[-277,-120]mm · 5 of 126 slices shown, 7 images]
[im 21/126  soft-tissue]
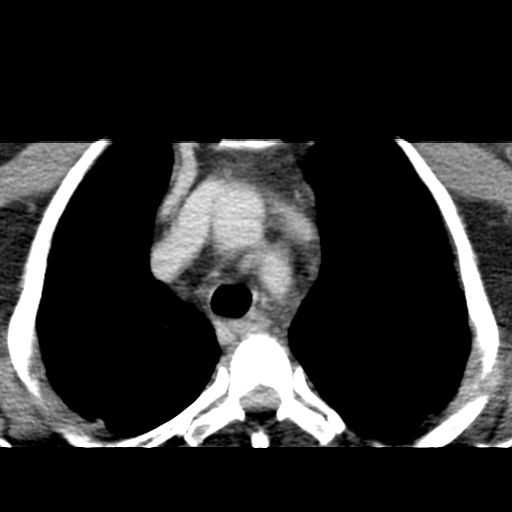
[im 21/126  bone]
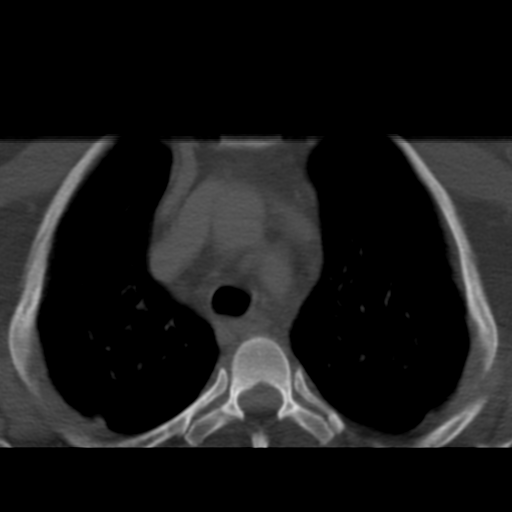
[im 42/126  bone]
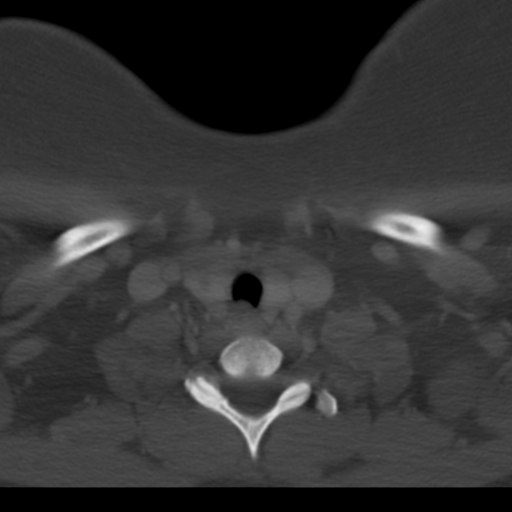
[im 63/126  bone]
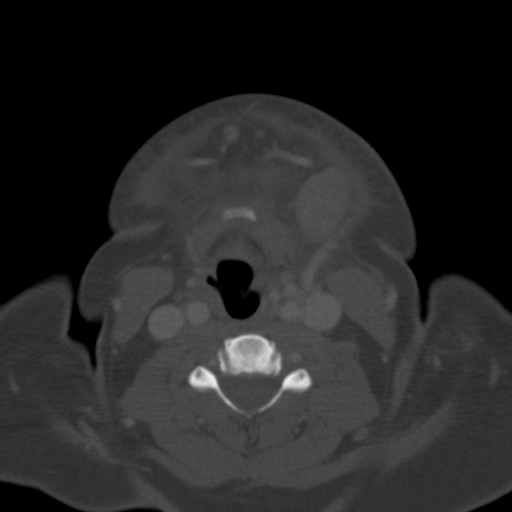
[im 84/126  bone]
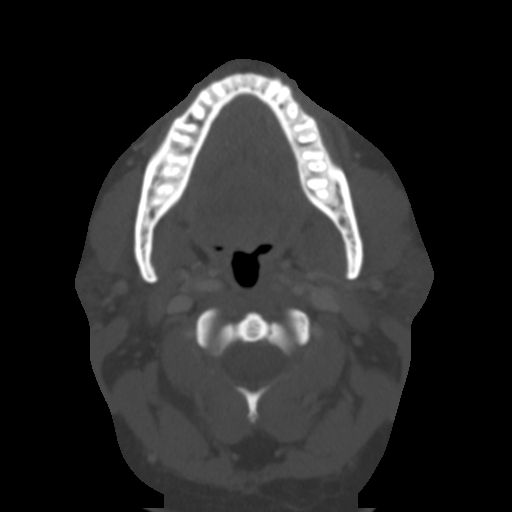
[im 105/126  soft-tissue]
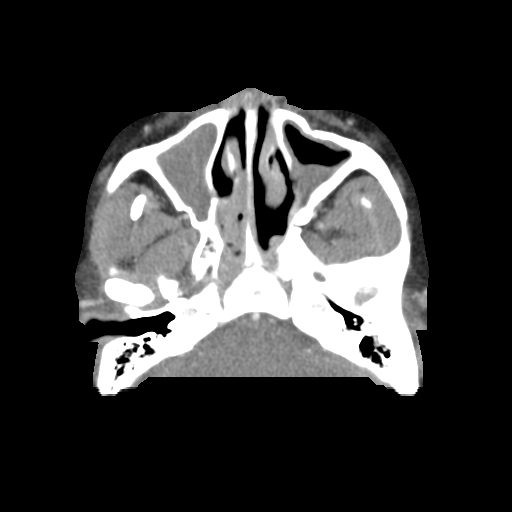
[im 105/126  bone]
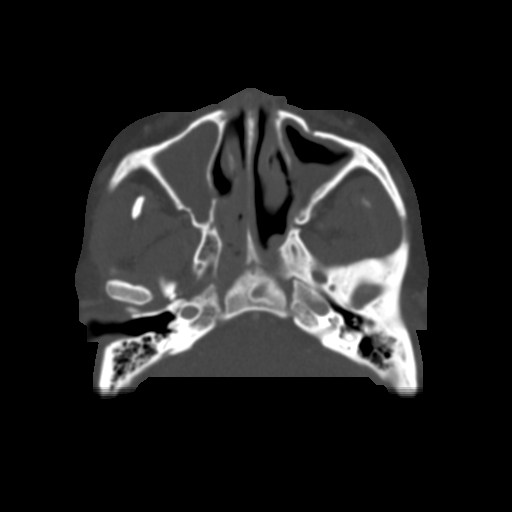

[Series 603: <mpr thick range(1)> · coronal · 0.39mm/px · 3 of 113 slices shown]
[im 40/113  bone]
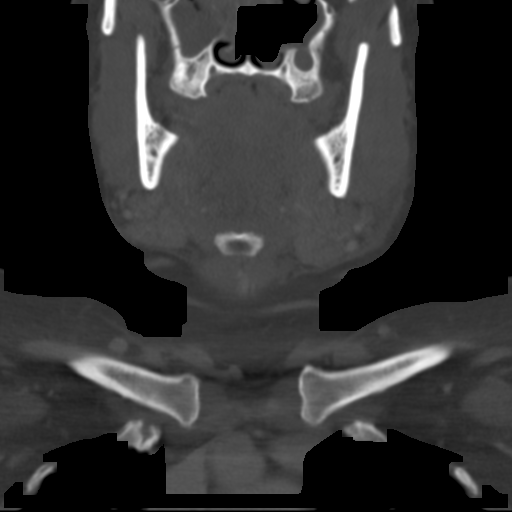
[im 51/113  bone]
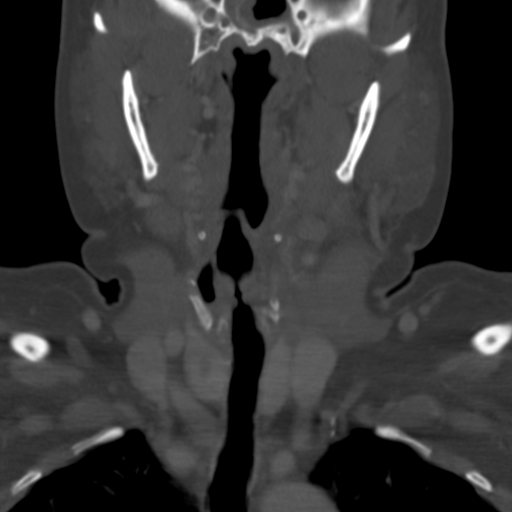
[im 62/113  bone]
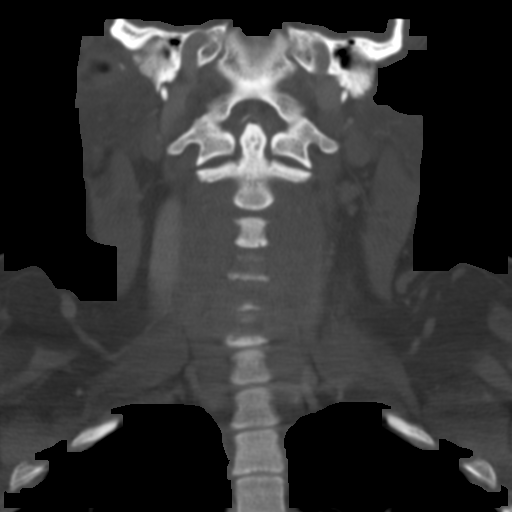

[Series 604: <mpr thick range(2)> · sagittal · 0.39mm/px · 5 of 100 slices shown, 6 images]
[im 34/100  bone]
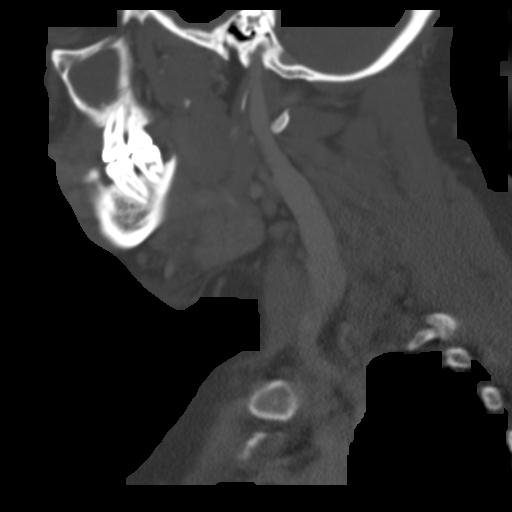
[im 42/100  bone]
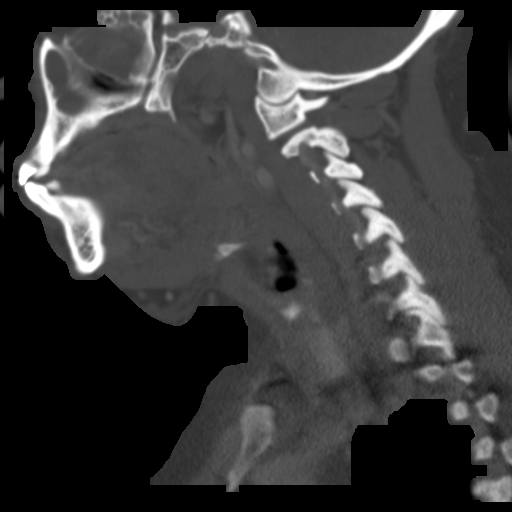
[im 50/100  soft-tissue]
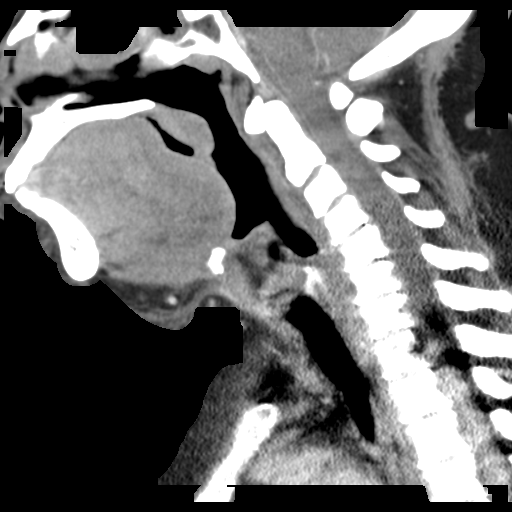
[im 50/100  bone]
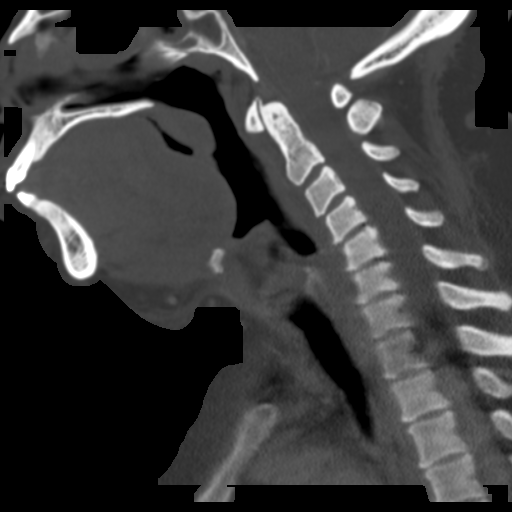
[im 58/100  bone]
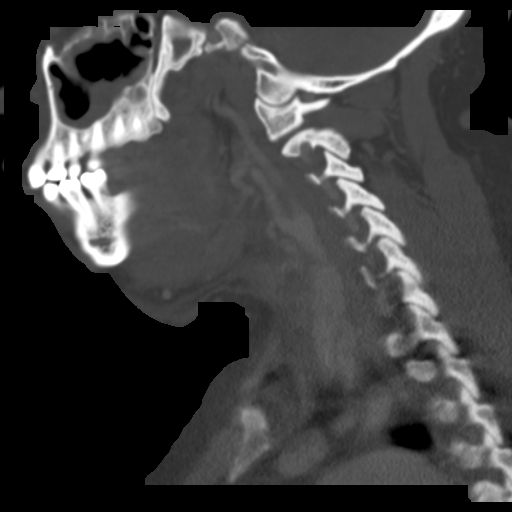
[im 67/100  bone]
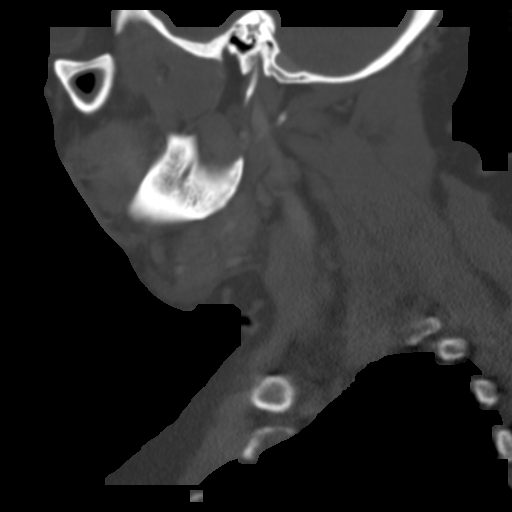

[15 of 33 positions shown; findings below may reference images not displayed]

FINDINGS: Inflammation subcutaneous region inferior to the floor of the mouth.
Inflammation of the left parapharyngeal region extending to the
retropharyngeal region. Mild edema/inflammation extends inferiorly
superficial to the left strap muscles and superiorly superficial to
the left masseter muscle/ platysma muscle.

Source of this inflammatory process is indeterminate. It is possible
this is related to inflammation of the left palatine tonsil however
the tonsils does not appear significantly enlarged. Presently no
well-defined drainable abscess.

No dental source identified as cause of inflammatory process. The
submandibular glands and parotid glands appear fairly symmetric
without duct dilation or stone identified.

Epiglottis does not appear inflamed. Asymmetric appearance of the
piriform sinus.

6 mm right thyroid lesion incompletely assessed.

Lung apices clear.

Prominent mucosal thickening left maxillary sinus post antrectomy.
Almost complete opacification right maxillary sinus. Polypoid
opacification nasal turbinates more notable on the right. Prominent
mucosal thickening sphenoid sinus air cells with left sphenoid sinus
air cell dominant. Opacification right ethmoid sinus air cell and
moderate mucosal thickening left ethmoid sinus air cell with
appearance to prior partial ethmoidectomy. Sinus opacification
slightly dense and may represent inspissated material. Fungal
disease not excluded.

Visualized mastoid air cells and middle ear cavities are clear.

Cervical spondylotic changes. Visualized orbital intracranial
structures unremarkable.
IMPRESSION: Inflammation subcutaneous region inferior to the floor of the mouth.
Inflammation of the left parapharyngeal region extending to the
retropharyngeal region. Mild edema / inflammation extends inferiorly
superficial to the left strap muscles and superiorly superficial to
the left masseter muscle/ platysma muscle.

Source of this inflammatory process is indeterminate. It is possible
this is related to inflammation of the left palatine tonsil however
the tonsils does not appear significantly enlarged. Presently no
well-defined drainable abscess.

Prominent paranasal sinus disease as noted above

6 mm right thyroid lesion incompletely assessed

## 2015-06-16 ENCOUNTER — Ambulatory Visit (HOSPITAL_COMMUNITY): Payer: Self-pay | Admitting: Psychiatry

## 2015-06-23 ENCOUNTER — Ambulatory Visit (INDEPENDENT_AMBULATORY_CARE_PROVIDER_SITE_OTHER): Payer: Medicare Other | Admitting: Psychiatry

## 2015-06-23 ENCOUNTER — Encounter (HOSPITAL_COMMUNITY): Payer: Self-pay | Admitting: Psychiatry

## 2015-06-23 VITALS — BP 122/78 | HR 68 | Ht 64.0 in | Wt 225.6 lb

## 2015-06-23 DIAGNOSIS — F419 Anxiety disorder, unspecified: Secondary | ICD-10-CM

## 2015-06-23 DIAGNOSIS — F200 Paranoid schizophrenia: Secondary | ICD-10-CM

## 2015-06-23 MED ORDER — HALOPERIDOL 2 MG PO TABS: 2.0000 mg | ORAL_TABLET | Freq: Every day | ORAL | Status: DC

## 2015-06-23 NOTE — Progress Notes (Signed)
[place] Progress Note  [name] [id number] 54 y.o.  06/23/2015 9:01 AM  Chief Complaint:  Medication management and followup.      History of Present Illness: Maria Franklin came for her followup appointment.  She is taking her medication as prescribed.  She takes Haldol 2 mg which helps her paranoia, sleep and delusions.  She is happy taking care of her grand daughter who is now 46 years old.  Sometimes she feels overwhelmed but she denies any recent irritability, anger, paranoia or any agitation.  She sleeping 6-7 hours.  She has no tremors, shakes.  Her appetite is okay.  Her energy level is good.  She has blood work done in May and she is happy that her hemoglobin A1c is stable.  Patient is still has some social isolation and she does not leave the house unless it is important.  However she denies any feeling of hopelessness or worthlessness.  Patient had stopped looking for a full-time job because she is taking care of her grand daughter.  Patient lives with her husband who is very supportive.  Patient denies drinking or using any illegal substances.  Her vitals are stable.  Suicidal Ideation: No Plan Formed: No Patient has means to carry out plan: No  Homicidal Ideation: No Plan Formed: No Patient has means to carry out plan: No  ROS  Psychiatric: Agitation: No Hallucination: No Depressed Mood: No Insomnia: No Hypersomnia: No Altered Concentration: No Feels Worthless: No Grandiose Ideas: No Belief In Special Powers: No New/Increased Substance Abuse: No Compulsions: No  Neurologic: Headache: No Seizure: No Paresthesias: No  Medical History:  Patient has a hypertension, hyperlipidemia, type 2 diabetes mellitus, hypothyroidism and obesity.    Outpatient Encounter Prescriptions as of 06/23/2015  Medication Sig  . albuterol (VENTOLIN HFA) 108 (90 BASE) MCG/ACT inhaler Inhale 1-2 puffs into the lungs every 6 (six) hours as needed for wheezing or shortness  of breath.  Marland Kitchen amLODipine (NORVASC) 10 MG tablet TAKE ONE TABLET BY MOUTH ONCE DAILY  . atorvastatin (LIPITOR) 10 MG tablet TAKE ONE TABLET BY MOUTH ONCE DAILY  . B Complex-C (B-COMPLEX WITH VITAMIN C) tablet Take 1 tablet by mouth daily.  . carvedilol (COREG) 12.5 MG tablet TAKE ONE TABLET BY MOUTH TWICE DAILY WITH MEALS  . cetirizine (ZYRTEC) 10 MG tablet TAKE ONE TABLET BY MOUTH ONCE DAILY  . Fluticasone-Salmeterol (ADVAIR) 250-50 MCG/DOSE AEPB Inhale 1 puff into the lungs 2 (two) times daily.  . haloperidol (HALDOL) 2 MG tablet Take 1 tablet (2 mg total) by mouth at bedtime.  . hydrochlorothiazide (HYDRODIURIL) 25 MG tablet TAKE ONE TABLET BY MOUTH ONCE DAILY  . KLOR-CON M20 20 MEQ tablet TAKE ONE TABLET BY MOUTH TWICE DAILY  . levothyroxine (SYNTHROID, LEVOTHROID) 50 MCG tablet TAKE ONE TABLET BY MOUTH ONCE DAILY BEFORE BREAKFAST  . lisinopril (PRINIVIL,ZESTRIL) 40 MG tablet Take 1 tablet (40 mg total) by mouth daily.  . mometasone (NASONEX) 50 MCG/ACT nasal spray Place 2 sprays into the nose daily.  . Omega-3 Fatty Acids (FISH OIL PO) Take 1 capsule by mouth daily.  . predniSONE (DELTASONE) 10 MG tablet Take 3 tabs on days 1-2, take 2 tabs on days 3-4, take 1 tab on days 5-6 (Patient not taking: Reported on 06/23/2015)  . tetrahydrozoline (VISINE) 0.05 % ophthalmic solution Place 2 drops into both eyes.  . [DISCONTINUED] haloperidol (HALDOL) 2 MG tablet Take 1 tablet (2 mg total) by mouth at bedtime.   No facility-administered encounter medications  on file as of 06/23/2015.    Past Psychiatric History/Hospitalization(s): Patient has history of paranoia and delusions.  She has one hospitalization at [facility].  She took overdose on her medication.  She was noncompliant with medication and required admission 4 months later.  She had tried Abilify with good response however at that time she could not afford it.  Patient denies any history of physical sexual verbal and emotional  abuse. Anxiety: Yes Bipolar Disorder: No Depression: Yes Mania: No Psychosis: No Schizophrenia: Yes Personality Disorder: No Hospitalization for psychiatric illness: Yes History of Electroconvulsive Shock Therapy: No Prior Suicide Attempts: Yes  Physical Exam: Constitutional:  BP 122/78 mmHg  Pulse 68  Ht 5\' 4"  (1.626 m)  Wt 225 lb 9.6 oz (102.331 kg)  BMI 38.70 kg/m2 she was reluctant to get blood pressure taken.  Recent Results (from the past 2160 hour(s))  CBC     Status: Abnormal   Collection Time: 04/21/15  9:14 AM  Result Value Ref Range   WBC 6.6 4.0 - 10.5 K/uL   RBC 3.99 3.87 - 5.11 Mil/uL   Platelets 258.0 150.0 - 400.0 K/uL   Hemoglobin 11.0 (L) 12.0 - 15.0 g/dL   HCT 33.0 (L) 36.0 - 46.0 %   MCV 82.7 78.0 - 100.0 fl   MCHC 33.2 30.0 - 36.0 g/dL   RDW 15.5 11.5 - 15.5 %  Comprehensive metabolic panel     Status: Abnormal   Collection Time: 04/21/15  9:14 AM  Result Value Ref Range   Sodium 137 135 - 145 mEq/L   Potassium 3.8 3.5 - 5.1 mEq/L   Chloride 104 96 - 112 mEq/L   CO2 28 19 - 32 mEq/L   Glucose, Bld 104 (H) 70 - 99 mg/dL   BUN 15 6 - 23 mg/dL   Creatinine, Ser 1.21 (H) 0.40 - 1.20 mg/dL   Total Bilirubin 0.3 0.2 - 1.2 mg/dL   Alkaline Phosphatase 80 39 - 117 U/L   AST 18 0 - 37 U/L   ALT 15 0 - 35 U/L   Total Protein 7.3 6.0 - 8.3 g/dL   Albumin 3.8 3.5 - 5.2 g/dL   Calcium 9.6 8.4 - 10.5 mg/dL   GFR 59.66 (L) >60.00 mL/min  TSH     Status: None   Collection Time: 04/21/15  9:14 AM  Result Value Ref Range   TSH 2.35 0.35 - 4.50 uIU/mL  T4, free     Status: None   Collection Time: 04/21/15  9:14 AM  Result Value Ref Range   Free T4 0.92 0.60 - 1.60 ng/dL  Lipid panel     Status: None   Collection Time: 04/21/15  9:14 AM  Result Value Ref Range   Cholesterol 149 0 - 200 mg/dL    Comment: ATP III Classification       Desirable:  < 200 mg/dL               Borderline High:  200 - 239 mg/dL          High:  > = 240 mg/dL   Triglycerides 58.0  0.0 - 149.0 mg/dL    Comment: Normal:  <150 mg/dLBorderline High:  150 - 199 mg/dL   HDL 56.40 >39.00 mg/dL   VLDL 11.6 0.0 - 40.0 mg/dL   LDL Cholesterol 81 0 - 99 mg/dL   Total CHOL/HDL Ratio 3     Comment:  Men          Women1/2 Average Risk     3.4          3.3Average Risk          5.0          4.42X Average Risk          9.6          7.13X Average Risk          15.0          11.0                       NonHDL 92.60     Comment: NOTE:  Non-HDL goal should be 30 mg/dL higher than patient's LDL goal (i.e. LDL goal of < 70 mg/dL, would have non-HDL goal of < 100 mg/dL)  Hemoglobin A1c     Status: None   Collection Time: 04/21/15  9:14 AM  Result Value Ref Range   Hgb A1c MFr Bld 6.2 4.6 - 6.5 %    Comment: Glycemic Control Guidelines for People with Diabetes:Non Diabetic:  <6%Goal of Therapy: <7%Additional Action Suggested:  >8%   Microalbumin / creatinine urine ratio     Status: None   Collection Time: 04/21/15  9:14 AM  Result Value Ref Range   Microalb, Ur <0.7 0.0 - 1.9 mg/dL   Creatinine,U 130.0 mg/dL   Microalb Creat Ratio 0.5 0.0 - 30.0 mg/g   General Appearance: alert, oriented, no acute distress, well nourished and obese  Musculoskeletal: Strength & Muscle Tone: within normal limits Gait & Station: normal Patient leans: N/A  Mental status examination: Patient is casually dressed and groomed.  She maintained fair eye contact.  Her speech is clear, fluent and coherent.  Her thought process slow but logical and goal-directed.  She denies any auditory or visual hallucination.  She denies any active or passive suicidal parts or homicidal thought.  There were no delusions, paranoia or any obsessive thoughts.  Her psychomotor activity is slow.  She has no tremors, shakes or any EPS.  Her attention and concentration is fair.  Her cognition is good.  She is alert and oriented 3.  Her insight judgment and impulse control is okay.    Established Problem, Stable/Improving  (1), Review of Psycho-Social Stressors (1), Review or order clinical lab tests (1), Review of Last Therapy Session (1) and Review of Medication Regimen & Side Effects (2)  Assessment: Axis I: Schizophrenia chronic paranoid type, anxiety disorder  Axis II: Deferred  Past Medical History  Diagnosis Date  . Asthma   . Chicken pox   . Allergy   . Hypertension   . Hyperlipidemia   . Thyroid disease      Plan:  Patient is stable on her current medication.  I will continue Haldol 2 mg at bedtime.  She has no side effects.  Discussed medication side effects and benefits.  Recommended to call us back if she has any question, concern or if she feels worsening of the symptom.  Follow-up in 3 months.  Maria Daigle T., MD 06/23/2015

## 2015-06-25 ENCOUNTER — Other Ambulatory Visit: Payer: Self-pay

## 2015-06-25 DIAGNOSIS — Z1231 Encounter for screening mammogram for malignant neoplasm of breast: Secondary | ICD-10-CM

## 2015-07-29 ENCOUNTER — Ambulatory Visit
Admission: RE | Admit: 2015-07-29 | Discharge: 2015-07-29 | Disposition: A | Discharge status: Home / Self Care | Payer: Medicare Other | Source: Ambulatory Visit

## 2015-07-29 DIAGNOSIS — Z1231 Encounter for screening mammogram for malignant neoplasm of breast: Secondary | ICD-10-CM | POA: Diagnosis not present

## 2015-08-05 ENCOUNTER — Other Ambulatory Visit: Payer: Self-pay | Admitting: Internal Medicine

## 2015-08-06 ENCOUNTER — Other Ambulatory Visit: Payer: Self-pay

## 2015-08-12 ENCOUNTER — Other Ambulatory Visit: Payer: Self-pay

## 2015-08-12 MED ORDER — HYDROCHLOROTHIAZIDE 25 MG PO TABS: 25.0000 mg | ORAL_TABLET | Freq: Every day | ORAL | Status: DC

## 2015-09-23 ENCOUNTER — Ambulatory Visit (HOSPITAL_COMMUNITY): Payer: Self-pay | Admitting: Psychiatry

## 2015-09-30 ENCOUNTER — Other Ambulatory Visit: Payer: Self-pay | Admitting: Internal Medicine

## 2015-10-22 ENCOUNTER — Encounter: Payer: Self-pay | Admitting: Internal Medicine

## 2015-10-28 ENCOUNTER — Encounter: Payer: Self-pay | Admitting: Internal Medicine

## 2015-10-28 ENCOUNTER — Other Ambulatory Visit: Payer: Self-pay | Admitting: Internal Medicine

## 2015-10-28 ENCOUNTER — Ambulatory Visit (INDEPENDENT_AMBULATORY_CARE_PROVIDER_SITE_OTHER): Payer: Medicare Other | Admitting: Internal Medicine

## 2015-10-28 VITALS — BP 108/70 | HR 85 | Temp 98.0°F | Ht 64.0 in | Wt 234.0 lb

## 2015-10-28 DIAGNOSIS — E785 Hyperlipidemia, unspecified: Secondary | ICD-10-CM | POA: Diagnosis not present

## 2015-10-28 DIAGNOSIS — Z23 Encounter for immunization: Secondary | ICD-10-CM | POA: Diagnosis not present

## 2015-10-28 DIAGNOSIS — I10 Essential (primary) hypertension: Secondary | ICD-10-CM

## 2015-10-28 DIAGNOSIS — Z0000 Encounter for general adult medical examination without abnormal findings: Secondary | ICD-10-CM

## 2015-10-28 DIAGNOSIS — K219 Gastro-esophageal reflux disease without esophagitis: Secondary | ICD-10-CM

## 2015-10-28 DIAGNOSIS — E039 Hypothyroidism, unspecified: Secondary | ICD-10-CM | POA: Diagnosis not present

## 2015-10-28 DIAGNOSIS — J302 Other seasonal allergic rhinitis: Secondary | ICD-10-CM

## 2015-10-28 DIAGNOSIS — E119 Type 2 diabetes mellitus without complications: Secondary | ICD-10-CM

## 2015-10-28 DIAGNOSIS — F200 Paranoid schizophrenia: Secondary | ICD-10-CM

## 2015-10-28 DIAGNOSIS — K122 Cellulitis and abscess of mouth: Secondary | ICD-10-CM

## 2015-10-28 DIAGNOSIS — N393 Stress incontinence (female) (male): Secondary | ICD-10-CM

## 2015-10-28 DIAGNOSIS — H04203 Unspecified epiphora, bilateral lacrimal glands: Secondary | ICD-10-CM

## 2015-10-28 DIAGNOSIS — E669 Obesity, unspecified: Secondary | ICD-10-CM

## 2015-10-28 LAB — COMPREHENSIVE METABOLIC PANEL
ALT: 14 U/L (ref 0–35)
AST: 18 U/L (ref 0–37)
Albumin: 4 g/dL (ref 3.5–5.2)
Alkaline Phosphatase: 78 U/L (ref 39–117)
BILIRUBIN TOTAL: 0.3 mg/dL (ref 0.2–1.2)
BUN: 15 mg/dL (ref 6–23)
CO2: 29 meq/L (ref 19–32)
Calcium: 9.6 mg/dL (ref 8.4–10.5)
Chloride: 104 mEq/L (ref 96–112)
Creatinine, Ser: 1.2 mg/dL (ref 0.40–1.20)
GFR: 60.12 mL/min (ref >60.00)
Glucose, Bld: 106 mg/dL — ABNORMAL HIGH (ref 70–99)
Potassium: 4 mEq/L (ref 3.5–5.1)
SODIUM: 139 meq/L (ref 135–145)
Total Protein: 7.5 g/dL (ref 6.0–8.3)

## 2015-10-28 LAB — LIPID PANEL
CHOLESTEROL: 173 mg/dL (ref 0–200)
HDL: 74.2 mg/dL (ref >39.00)
LDL Cholesterol: 84 mg/dL (ref 0–99)
NonHDL: 98.99
Total CHOL/HDL Ratio: 2
Triglycerides: 77 mg/dL (ref 0.0–149.0)
VLDL: 15.4 mg/dL (ref 0.0–40.0)

## 2015-10-28 LAB — CBC
HCT: 34.3 % — ABNORMAL LOW (ref 36.0–46.0)
Hemoglobin: 11.4 g/dL — ABNORMAL LOW (ref 12.0–15.0)
MCHC: 33.2 g/dL (ref 30.0–36.0)
MCV: 83.8 fl (ref 78.0–100.0)
Platelets: 283 10*3/uL (ref 150.0–400.0)
RBC: 4.09 Mil/uL (ref 3.87–5.11)
RDW: 15.2 % (ref 11.5–15.5)
WBC: 7.9 10*3/uL (ref 4.0–10.5)

## 2015-10-28 LAB — T4, FREE: FREE T4: 0.79 ng/dL (ref 0.60–1.60)

## 2015-10-28 LAB — HEMOGLOBIN A1C: HEMOGLOBIN A1C: 6.2 % (ref 4.6–6.5)

## 2015-10-28 LAB — TSH: TSH: 3.43 u[IU]/mL (ref 0.35–4.50)

## 2015-10-28 MED ORDER — HYDROCHLOROTHIAZIDE 25 MG PO TABS: 25.0000 mg | ORAL_TABLET | Freq: Every day | ORAL | Status: DC

## 2015-10-28 MED ORDER — ATORVASTATIN CALCIUM 10 MG PO TABS: 10.0000 mg | ORAL_TABLET | Freq: Every day | ORAL | Status: DC

## 2015-10-28 MED ORDER — LISINOPRIL 40 MG PO TABS: 40.0000 mg | ORAL_TABLET | Freq: Every day | ORAL | Status: DC

## 2015-10-28 MED ORDER — FLUTICASONE-SALMETEROL 250-50 MCG/DOSE IN AEPB: INHALATION_SPRAY | RESPIRATORY_TRACT | Status: DC

## 2015-10-28 MED ORDER — POTASSIUM CHLORIDE CRYS ER 20 MEQ PO TBCR: 20.0000 meq | EXTENDED_RELEASE_TABLET | Freq: Two times a day (BID) | ORAL | Status: DC

## 2015-10-28 MED ORDER — OMEPRAZOLE 20 MG PO CPDR: 20.0000 mg | DELAYED_RELEASE_CAPSULE | Freq: Every day | ORAL | Status: DC

## 2015-10-28 MED ORDER — LEVOTHYROXINE SODIUM 50 MCG PO TABS: ORAL_TABLET | ORAL | Status: DC

## 2015-10-28 MED ORDER — AMLODIPINE BESYLATE 10 MG PO TABS: 10.0000 mg | ORAL_TABLET | Freq: Every day | ORAL | Status: DC

## 2015-10-28 MED ORDER — CARVEDILOL 12.5 MG PO TABS: 12.5000 mg | ORAL_TABLET | Freq: Two times a day (BID) | ORAL | Status: DC

## 2015-10-28 MED ORDER — ALBUTEROL SULFATE HFA 108 (90 BASE) MCG/ACT IN AERS: 1.0000 | INHALATION_SPRAY | Freq: Four times a day (QID) | RESPIRATORY_TRACT | Status: DC | PRN

## 2015-10-28 NOTE — Assessment & Plan Note (Signed)
Encouraged her to start voiding every 2 hours to reduce her urge incontinence Also avoid caffeine

## 2015-10-28 NOTE — Assessment & Plan Note (Signed)
LDL at goal Will check Lipid Profile and CMET today Encouraged her to consume a low fat diet

## 2015-10-28 NOTE — Progress Notes (Addendum)
HPI:  Pt presents to the clinic today for her Medicare Wellness exam. She is also due for follow up of chronic conditions.  Allergic Rhinitis: Her symptoms are usually worse in the spring. She takes Zyrtec and Flonase daily. She reports that she has polyps in her nose. She has seen ENT in the past for the same. She has had a polypectomy x 2 but they keep coming back.  Asthma: She takes Advair daily. She is using her Albuterol about 1 time per month. She does not follow with a pulmonologist.  Maria Franklin angina: Denies chest pain. She does not follow with a cardiologist.  DM 2: her last A1C was 6.2%. She does not test her sugars. Her DM 2 is diet controlled. Her last eye exam was 2014. Flu 10/2014. Pneumovax 10/2014.  HLD: She denies myalgias on Lipitor. She takes Fish Oil as well. Her last LDL was 81. She does try to consume a low fat diet.  HTN: BP well controlled on Norvasc, Coreg, HCTZ and Lisinopril. She is also taking a potassium supplement daily. Her BP today is 108/70.  Hypothyroidism: Her last TSH and T4 were WNL. She denies s/s of hypothyroidism on current dose of Synthroid.  Obesity: She has gained 10 lbs since her last visit. Her BMI today is 40.17.She has gotten off her diet, and is no longer exercising.  Paranoid Schizophrenia: She takes Haldol daily. She follows with Dr. Adele Schilder, at [facility].  Past Medical History  Diagnosis Date  . Asthma   . Chicken pox   . Allergy   . Hypertension   . Hyperlipidemia   . Thyroid disease     Current Outpatient Prescriptions  Medication Sig Dispense Refill  . ADVAIR DISKUS 250-50 MCG/DOSE AEPB INHALE ONE PUFF BY MOUTH TWICE DAILY. 120 each 0  . albuterol (VENTOLIN HFA) 108 (90 BASE) MCG/ACT inhaler Inhale 1-2 puffs into the lungs every 6 (six) hours as needed for wheezing or shortness of breath. 18 g 5  . amLODipine (NORVASC) 10 MG tablet TAKE ONE TABLET BY MOUTH ONCE DAILY 90 tablet 1  . atorvastatin (LIPITOR) 10 MG tablet TAKE  ONE TABLET BY MOUTH ONCE DAILY 90 tablet 1  . B Complex-C (B-COMPLEX WITH VITAMIN C) tablet Take 1 tablet by mouth daily. 90 tablet 0  . carvedilol (COREG) 12.5 MG tablet TAKE ONE TABLET BY MOUTH TWICE DAILY WITH MEALS 180 tablet 1  . cetirizine (ZYRTEC) 10 MG tablet TAKE ONE TABLET BY MOUTH ONCE DAILY 90 tablet 1  . haloperidol (HALDOL) 2 MG tablet Take 1 tablet (2 mg total) by mouth at bedtime. 90 tablet 0  . hydrochlorothiazide (HYDRODIURIL) 25 MG tablet Take 1 tablet (25 mg total) by mouth daily. 90 tablet 1  . KLOR-CON M20 20 MEQ tablet TAKE ONE TABLET BY MOUTH TWICE DAILY 180 tablet 1  . levothyroxine (SYNTHROID, LEVOTHROID) 50 MCG tablet TAKE ONE TABLET BY MOUTH ONCE DAILY BEFORE BREAKFAST 90 tablet 1  . lisinopril (PRINIVIL,ZESTRIL) 40 MG tablet Take 1 tablet (40 mg total) by mouth daily. 90 tablet 1  . lisinopril (PRINIVIL,ZESTRIL) 40 MG tablet TAKE ONE TABLET BY MOUTH ONCE DAILY 90 tablet 0  . mometasone (NASONEX) 50 MCG/ACT nasal spray Place 2 sprays into the nose daily. 17 g 12  . Omega-3 Fatty Acids (FISH OIL PO) Take 1 capsule by mouth daily.    . predniSONE (DELTASONE) 10 MG tablet Take 3 tabs on days 1-2, take 2 tabs on days 3-4, take 1 tab on days 5-6  12 tablet 0  . tetrahydrozoline (VISINE) 0.05 % ophthalmic solution Place 2 drops into both eyes.     No current facility-administered medications for this visit.    Allergies  Allergen Reactions  . Pollen Extract Cough    Family History  Problem Relation Age of Onset  . Mental illness Mother   . Hypertension Mother   . Schizophrenia Mother   . Depression Mother   . Cancer Father     LUNG  . Cancer Sister     BREAST  . Mental illness Brother   . Hypertension Brother   . Schizophrenia Brother   . Depression Brother   . Heart disease Maternal Grandmother   . Depression Maternal Grandmother   . Schizophrenia Maternal Grandmother   . Schizophrenia Brother   . Schizophrenia Maternal Uncle     Social History    Social History  . Marital Status: Married    Spouse Name: N/A  . Number of Children: N/A  . Years of Education: N/A   Occupational History  . Not on file.   Social History Main Topics  . Smoking status: Former Research scientist (life sciences)  . Smokeless tobacco: Not on file  . Alcohol Use: No  . Drug Use: No  . Sexual Activity: Not Currently   Other Topics Concern  . Not on file   Social History Narrative   3 CHILDREN   RECENTLY MOVED TO [place]: None  Health Maintenance:    Flu: 10/2014  Tetanus: 10/2014  Pneumovax: 10/2014  Mammogram: 07/2015  Pap Smear: 01/2014  Colon Screening: 2013  Eye Doctor: 2014  Dental Exam: as needed   Providers:   PCP: Webb Silversmith, NP-C  Psychiatrist: Dr. Adele Schilder  I have personally reviewed and have noted:  1. The patient's medical and social history 2. Their use of alcohol, tobacco or illicit drugs 3. Their current medications and supplements 4. The patient's functional ability including ADL's, fall risks, home safety  risks and hearing or visual impairment. 5. Diet and physical activities 6. Evidence for depression or mood disorder  Subjective:   Review of Systems:   Constitutional: Pt reports weight gain. Denies fever, malaise, fatigue, headache.  HEENT: Pt reports watery eyes. Denies eye pain, eye redness, ear pain, ringing in the ears, wax buildup, runny nose, nasal congestion, bloody nose, or sore throat. Respiratory: Denies difficulty breathing, shortness of breath, cough or sputum production.   Cardiovascular: Denies chest pain, chest tightness, palpitations or swelling in the hands or feet.  Gastrointestinal: Pt reports reflux. Denies abdominal pain, bloating, constipation, diarrhea or blood in the stool.  GU: Pt reports urinary urgency. Denies frequency, pain with urination, burning sensation, blood in urine, odor or discharge. Musculoskeletal: Denies decrease in range of motion, difficulty  with gait, muscle pain or joint pain and swelling.  Skin: Denies redness, rashes, lesions or ulcercations.  Neurological: Denies dizziness, difficulty with memory, difficulty with speech or problems with balance and coordination.  Psych: Denies anxiety, depression, SI/HI.  No other specific complaints in a complete review of systems (except as listed in HPI above).  Objective:  PE:   BP 108/70 mmHg  Pulse 85  Temp(Src) 98 F (36.7 C) (Oral)  Ht 5\' 4"  (1.626 m)  Wt 234 lb (106.142 kg)  BMI 40.15 kg/m2  SpO2 97%  LMP 09/06/2015 Wt Readings from Last 3 Encounters:  10/28/15 234 lb (106.142 kg)  06/23/15 225 lb 9.6 oz (102.331 kg)  04/21/15 224 lb (101.606  kg)    General: Appears her stated age, obese in NAD. Skin: Warm, dry and intact. No rashes, lesions or ulcerations noted. HEENT: Head: normal shape and size; Eyes: sclera white, no icterus, conjunctiva pink, PERRLA and EOMs intact, tearing noted of the left eye; Ears: Tm's gray and intact, normal light reflex; Throat/Mouth: Teeth present, mucosa pink and moist, no exudate, lesions or ulcerations noted.  Neck: Neck supple, trachea midline. No masses, lumps present. Thyromegaly noted. Cardiovascular: Normal rate and rhythm. S1,S2 noted.  No murmur, rubs or gallops noted. No JVD or BLE edema. No carotid bruits noted. Pulmonary/Chest: Normal effort and positive vesicular breath sounds. No respiratory distress. No wheezes, rales or ronchi noted.  Abdomen: Soft and nontender. Normal bowel sounds. No distention or masses noted. Liver, spleen and kidneys non palpable. Musculoskeletal: Strength 5/5 BUE/BLE. No signs of joint swelling.  Neurological: Alert and oriented. Cranial nerves II-XII grossly intact. Coordination normal.  Psychiatric: Mood and affect normal. Behavior is normal. Judgment and thought content normal.    BMET    Component Value Date/Time   NA 137 04/21/2015 0914   K 3.8 04/21/2015 0914   CL 104 04/21/2015 0914    CO2 28 04/21/2015 0914   GLUCOSE 104* 04/21/2015 0914   BUN 15 04/21/2015 0914   CREATININE 1.21* 04/21/2015 0914   CALCIUM 9.6 04/21/2015 0914   GFRNONAA 60* 01/01/2014 0330   GFRAA 70* 01/01/2014 0330    Lipid Panel     Component Value Date/Time   CHOL 149 04/21/2015 0914   TRIG 58.0 04/21/2015 0914   HDL 56.40 04/21/2015 0914   CHOLHDL 3 04/21/2015 0914   VLDL 11.6 04/21/2015 0914   LDLCALC 81 04/21/2015 0914    CBC    Component Value Date/Time   WBC 6.6 04/21/2015 0914   RBC 3.99 04/21/2015 0914   HGB 11.0* 04/21/2015 0914   HCT 33.0* 04/21/2015 0914   PLT 258.0 04/21/2015 0914   MCV 82.7 04/21/2015 0914   MCH 28.1 01/01/2014 0330   MCHC 33.2 04/21/2015 0914   RDW 15.5 04/21/2015 0914   LYMPHSABS 1.7 01/14/2014 1224   MONOABS 0.3 01/14/2014 1224   EOSABS 0.1 01/14/2014 1224   BASOSABS 0.0 01/14/2014 1224    Hgb A1C Lab Results  Component Value Date   HGBA1C 6.2 04/21/2015      Assessment and Plan:   Medicare Annual Wellness Visit:  Diet: Diabetic Physical activity: Sedentary Depression/mood screen: Negative Hearing: Intact to whispered voice Visual acuity: Grossly normal, encouraged to perform annual eye exam  ADLs: Capable Fall risk: None Home safety: Good Cognitive evaluation: Intact to orientation, naming, recall and repetition EOL planning: No adv directives, full code/ I agree  Preventative Medicine: Flu shot today. Pneumovax UTD. She is not sure about her Tetanus but declines today. Pap smear and mammogram UTD. Colonoscopy UTD. Encouraged her to see an eye doctor and dentist annually. Encouraged her to consume a low carb, low fat diet and start an exercise regimen.   Next appointment: 6 month followup

## 2015-10-28 NOTE — Assessment & Plan Note (Signed)
No issues Will continue to monitor 

## 2015-10-28 NOTE — Assessment & Plan Note (Signed)
She will continue to follow with Dr. Adele Schilder

## 2015-10-28 NOTE — Addendum Note (Signed)
Addended by: Jearld Fenton on: 10/28/2015 01:08 PM   Modules accepted: Orders

## 2015-10-28 NOTE — Assessment & Plan Note (Signed)
Will check TSH and T4 today Will adjust Synthroid dose if needed

## 2015-10-28 NOTE — Assessment & Plan Note (Signed)
Diet controlled Encouraged her to consume a low fat, low carb diet A1C today No microalbumin secondary to ACEI therapy Flu shot today Pneumovax UTD Foot exam today Encouraged her to see an eye doctor annually

## 2015-10-28 NOTE — Addendum Note (Signed)
Addended by: Jearld Fenton on: 10/28/2015 11:17 AM   Modules accepted: Orders, Medications

## 2015-10-28 NOTE — Addendum Note (Signed)
Addended byCloyd Stagers B on: 10/28/2015 12:29 PM   Modules accepted: Orders

## 2015-10-28 NOTE — Patient Instructions (Signed)
Diabetes Mellitus and Food It is important for you to manage your blood sugar (glucose) level. Your blood glucose level can be greatly affected by what you eat. Eating healthier foods in the appropriate amounts throughout the day at about the same time each day will help you control your blood glucose level. It can also help slow or prevent worsening of your diabetes mellitus. Healthy eating may even help you improve the level of your blood pressure and reach or maintain a healthy weight.  General recommendations for healthful eating and cooking habits include:  Eating meals and snacks regularly. Avoid going long periods of time without eating to lose weight.  Eating a diet that consists mainly of plant-based foods, such as fruits, vegetables, nuts, legumes, and whole grains.  Using low-heat cooking methods, such as baking, instead of high-heat cooking methods, such as deep frying. Work with your dietitian to make sure you understand how to use the Nutrition Facts information on food labels. HOW CAN FOOD AFFECT ME? Carbohydrates Carbohydrates affect your blood glucose level more than any other type of food. Your dietitian will help you determine how many carbohydrates to eat at each meal and teach you how to count carbohydrates. Counting carbohydrates is important to keep your blood glucose at a healthy level, especially if you are using insulin or taking certain medicines for diabetes mellitus. Alcohol Alcohol can cause sudden decreases in blood glucose (hypoglycemia), especially if you use insulin or take certain medicines for diabetes mellitus. Hypoglycemia can be a life-threatening condition. Symptoms of hypoglycemia (sleepiness, dizziness, and disorientation) are similar to symptoms of having too much alcohol.  If your health care provider has given you approval to drink alcohol, do so in moderation and use the following guidelines:  Women should not have more than one drink per day, and men  should not have more than two drinks per day. One drink is equal to:  12 oz of beer.  5 oz of wine.  1 oz of hard liquor.  Do not drink on an empty stomach.  Keep yourself hydrated. Have water, diet soda, or unsweetened iced tea.  Regular soda, juice, and other mixers might contain a lot of carbohydrates and should be counted. WHAT FOODS ARE NOT RECOMMENDED? As you make food choices, it is important to remember that all foods are not the same. Some foods have fewer nutrients per serving than other foods, even though they might have the same number of calories or carbohydrates. It is difficult to get your body what it needs when you eat foods with fewer nutrients. Examples of foods that you should avoid that are high in calories and carbohydrates but low in nutrients include:  Trans fats (most processed foods list trans fats on the Nutrition Facts label).  Regular soda.  Juice.  Candy.  Sweets, such as cake, pie, doughnuts, and cookies.  Fried foods. WHAT FOODS CAN I EAT? Eat nutrient-rich foods, which will nourish your body and keep you healthy. The food you should eat also will depend on several factors, including:  The calories you need.  The medicines you take.  Your weight.  Your blood glucose level.  Your blood pressure level.  Your cholesterol level. You should eat a variety of foods, including:  Protein.  Lean cuts of meat.  Proteins low in saturated fats, such as fish, egg whites, and beans. Avoid processed meats.  Fruits and vegetables.  Fruits and vegetables that may help control blood glucose levels, such as apples, mangoes, and   yams.  Dairy products.  Choose fat-free or low-fat dairy products, such as milk, yogurt, and cheese.  Grains, bread, pasta, and rice.  Choose whole grain products, such as multigrain bread, whole oats, and brown rice. These foods may help control blood pressure.  Fats.  Foods containing healthful fats, such as nuts,  avocado, olive oil, canola oil, and fish. DOES EVERYONE WITH DIABETES MELLITUS HAVE THE SAME MEAL PLAN? Because every person with diabetes mellitus is different, there is not one meal plan that works for everyone. It is very important that you meet with a dietitian who will help you create a meal plan that is just right for you.   This information is not intended to replace advice given to you by your health care provider. Make sure you discuss any questions you have with your health care provider.   Document Released: 08/19/2005 Document Revised: 12/13/2014 Document Reviewed: 10/19/2013 Elsevier Interactive Patient Education 2016 [employer]  

## 2015-10-28 NOTE — Assessment & Plan Note (Signed)
Well controlled on Norvasc, Lisinopril, HCTZ and Coreg ECG from 12/2013 reviewed CBC and CMET today

## 2015-10-28 NOTE — Assessment & Plan Note (Signed)
Discussed how weight loss could help improve her reflux Start Prilosec, will send in RX

## 2015-10-28 NOTE — Assessment & Plan Note (Signed)
Encouraged her to work on diet and exercise 

## 2015-10-28 NOTE — Assessment & Plan Note (Signed)
Continue Zyrtec and Flonase Make an appt with your eye doctor to discuss tearing of the eyes

## 2015-10-29 ENCOUNTER — Encounter: Payer: Self-pay | Admitting: Internal Medicine

## 2015-11-24 ENCOUNTER — Telehealth: Payer: Self-pay

## 2015-11-24 NOTE — Telephone Encounter (Signed)
Pt left v/m that Nasonex has been d/c and pt request different med sent to [facility]; spoke with Satya at [facility] and was advised nasonex should be in today and pt needs to call with updated ins information. Pt advised and if has any further problem will call [facility] back.

## 2015-12-15 ENCOUNTER — Other Ambulatory Visit (HOSPITAL_COMMUNITY): Payer: Self-pay | Admitting: Psychiatry

## 2015-12-15 DIAGNOSIS — F200 Paranoid schizophrenia: Secondary | ICD-10-CM

## 2015-12-17 NOTE — Telephone Encounter (Signed)
Met with Dr.Arfeen who approved a one time 30 day new order for patient's prescribed Haldol 2 mg, one at bedtime, #30 with no refills and new order e-scribed to patient's [facility] on [facility] with instruction evaluation required for further refills.  Patient scheduled for 12/30/15.

## 2015-12-30 ENCOUNTER — Encounter (HOSPITAL_COMMUNITY): Payer: Self-pay | Admitting: Psychiatry

## 2015-12-30 ENCOUNTER — Ambulatory Visit (INDEPENDENT_AMBULATORY_CARE_PROVIDER_SITE_OTHER): Payer: Medicare HMO | Admitting: Psychiatry

## 2015-12-30 VITALS — BP 118/74 | HR 90 | Ht 64.0 in | Wt 242.0 lb

## 2015-12-30 DIAGNOSIS — F200 Paranoid schizophrenia: Secondary | ICD-10-CM

## 2015-12-30 DIAGNOSIS — F419 Anxiety disorder, unspecified: Secondary | ICD-10-CM

## 2015-12-30 MED ORDER — HALOPERIDOL 2 MG PO TABS: 2.0000 mg | ORAL_TABLET | Freq: Every day | ORAL | Status: DC

## 2015-12-30 NOTE — Progress Notes (Signed)
[facility] [phone] Progress Note  Maria Franklin [id number] y.o.  12/30/2015 10:08 AM  Chief Complaint:  I have only a few pills left.  I don't want to be out of medication.        History of Present Illness: Maria Franklin came for her followup appointment.  She was last seen in July 2016.  She has been compliant with medication but recently she is very scared that she may be out of the medication.  She is taking low-dose Haldol which is helping her paranoia and hallucination.  She apologized for missing appointment but overall she reported things are going well.  She had good Christmas.  She did not visit family member and stay with her husband.  Her sleep is good.  Recently she's seen her primary care physician and now she is taking multiple medication for blood pressure.  She has gained weight from the past.  She is excited because her daughter is pregnant and due in July.  Currently she is babysitting her grandchild and admitted sometime gets overwhelmed.  However she denies any irritability, anger, severe mood swing or any feeling of hopelessness or worthlessness.  She sleeping 7-8 hours.  Her energy level is good.  Patient lives with her husband is very supportive.  She denies drinking or using any illegal substances.  She has gained weight from the past and trying to watch her diet but admitted noncompliant with exercise or walking.    Suicidal Ideation: No Plan Formed: No Patient has means to carry out plan: No  Homicidal Ideation: No Plan Formed: No Patient has means to carry out plan: No  Review of Systems  Constitutional: Negative.  Negative for weight loss.       Weight gain  Cardiovascular: Negative for chest pain and palpitations.  Gastrointestinal: Negative for heartburn.  Neurological: Negative for dizziness, tremors and headaches.  Psychiatric/Behavioral: Negative for suicidal ideas and substance abuse.    Psychiatric: Agitation: No Hallucination: No Depressed  Mood: No Insomnia: No Hypersomnia: No Altered Concentration: No Feels Worthless: No Grandiose Ideas: No Belief In Special Powers: No New/Increased Substance Abuse: No Compulsions: No  Neurologic: Headache: No Seizure: No Paresthesias: No  Medical History:  Patient has a hypertension, hyperlipidemia, type 2 diabetes mellitus, hypothyroidism and obesity.    Outpatient Encounter Prescriptions as of 12/30/2015  Medication Sig  . albuterol (VENTOLIN HFA) 108 (90 BASE) MCG/ACT inhaler Inhale 1-2 puffs into the lungs every 6 (six) hours as needed for wheezing or shortness of breath.  Marland Kitchen amLODipine (NORVASC) 10 MG tablet Take 1 tablet (10 mg total) by mouth daily.  Marland Kitchen atorvastatin (LIPITOR) 10 MG tablet Take 1 tablet (10 mg total) by mouth daily.  . B Complex-C (B-COMPLEX WITH VITAMIN C) tablet Take 1 tablet by mouth daily.  . carvedilol (COREG) 12.5 MG tablet Take 1 tablet (12.5 mg total) by mouth 2 (two) times daily with a meal.  . cetirizine (ZYRTEC) 10 MG tablet TAKE ONE TABLET BY MOUTH ONCE DAILY  . Fluticasone-Salmeterol (ADVAIR DISKUS) 250-50 MCG/DOSE AEPB INHALE ONE PUFF BY MOUTH TWICE DAILY.  . haloperidol (HALDOL) 2 MG tablet Take 1 tablet (2 mg total) by mouth at bedtime.  . hydrochlorothiazide (HYDRODIURIL) 25 MG tablet Take 1 tablet (25 mg total) by mouth daily.  Marland Kitchen levothyroxine (SYNTHROID, LEVOTHROID) 50 MCG tablet TAKE ONE TABLET BY MOUTH ONCE DAILY BEFORE BREAKFAST  . lisinopril (PRINIVIL,ZESTRIL) 40 MG tablet Take 1 tablet (40 mg total) by mouth daily.  . mometasone (NASONEX)  50 MCG/ACT nasal spray Place 2 sprays into the nose daily.  . Omega-3 Fatty Acids (FISH OIL PO) Take 1 capsule by mouth daily.  Marland Kitchen omeprazole (PRILOSEC) 20 MG capsule Take 1 capsule (20 mg total) by mouth daily.  . potassium chloride SA (KLOR-CON M20) 20 MEQ tablet Take 1 tablet (20 mEq total) by mouth 2 (two) times daily.  Marland Kitchen tetrahydrozoline (VISINE) 0.05 % ophthalmic solution Place 2 drops into both  eyes.  . [DISCONTINUED] haloperidol (HALDOL) 2 MG tablet TAKE ONE TABLET BY MOUTH AT BEDTIME   No facility-administered encounter medications on file as of 12/30/2015.    Past Psychiatric History/Hospitalization(s): Patient has history of paranoia and delusions.  She has one hospitalization at [facility].  She took overdose on her medication.  She was noncompliant with medication and required admission 4 months later.  She had tried Abilify with good response however at that time she could not afford it.  Patient denies any history of physical sexual verbal and emotional abuse. Anxiety: Yes Bipolar Disorder: No Depression: Yes Mania: No Psychosis: No Schizophrenia: Yes Personality Disorder: No Hospitalization for psychiatric illness: Yes History of Electroconvulsive Shock Therapy: No Prior Suicide Attempts: Yes  Physical Exam: Constitutional:  BP 118/74 mmHg  Pulse 90  Ht 5\' 4"  (1.626 m)  Wt 242 lb (109.77 kg)  BMI 41.52 kg/m2 she was reluctant to get blood pressure taken.  Recent Results (from the past 2160 hour(s))  CBC     Status: Abnormal   Collection Time: 10/28/15  9:27 AM  Result Value Ref Range   WBC 7.9 4.0 - 10.5 K/uL   RBC 4.09 3.87 - 5.11 Mil/uL   Platelets 283.0 150.0 - 400.0 K/uL   Hemoglobin 11.4 (L) 12.0 - 15.0 g/dL   HCT 34.3 (L) 36.0 - 46.0 %   MCV 83.8 78.0 - 100.0 fl   MCHC 33.2 30.0 - 36.0 g/dL   RDW 15.2 11.5 - 15.5 %  Comprehensive metabolic panel     Status: Abnormal   Collection Time: 10/28/15  9:27 AM  Result Value Ref Range   Sodium 139 135 - 145 mEq/L   Potassium 4.0 3.5 - 5.1 mEq/L   Chloride 104 96 - 112 mEq/L   CO2 29 19 - 32 mEq/L   Glucose, Bld 106 (H) 70 - 99 mg/dL   BUN 15 6 - 23 mg/dL   Creatinine, Ser 1.20 0.40 - 1.20 mg/dL   Total Bilirubin 0.3 0.2 - 1.2 mg/dL   Alkaline Phosphatase 78 39 - 117 U/L   AST 18 0 - 37 U/L   ALT 14 0 - 35 U/L   Total Protein 7.5 6.0 - 8.3 g/dL   Albumin 4.0 3.5 - 5.2 g/dL   Calcium 9.6  8.4 - 10.5 mg/dL   GFR 60.12 >60.00 mL/min  TSH     Status: None   Collection Time: 10/28/15  9:27 AM  Result Value Ref Range   TSH 3.43 0.35 - 4.50 uIU/mL  T4, free     Status: None   Collection Time: 10/28/15  9:27 AM  Result Value Ref Range   Free T4 0.79 0.60 - 1.60 ng/dL  Lipid panel     Status: None   Collection Time: 10/28/15  9:27 AM  Result Value Ref Range   Cholesterol 173 0 - 200 mg/dL    Comment: ATP III Classification       Desirable:  < 200 mg/dL  Borderline High:  200 - 239 mg/dL          High:  > = 240 mg/dL   Triglycerides 77.0 0.0 - 149.0 mg/dL    Comment: Normal:  <150 mg/dLBorderline High:  150 - 199 mg/dL   HDL 74.20 >39.00 mg/dL   VLDL 15.4 0.0 - 40.0 mg/dL   LDL Cholesterol 84 0 - 99 mg/dL   Total CHOL/HDL Ratio 2     Comment:                Men          Women1/2 Average Risk     3.4          3.3Average Risk          5.0          4.42X Average Risk          9.6          7.13X Average Risk          15.0          11.0                       NonHDL 98.99     Comment: NOTE:  Non-HDL goal should be 30 mg/dL higher than patient's LDL goal (i.e. LDL goal of < 70 mg/dL, would have non-HDL goal of < 100 mg/dL)  Hemoglobin A1c     Status: None   Collection Time: 10/28/15  9:27 AM  Result Value Ref Range   Hgb A1c MFr Bld 6.2 4.6 - 6.5 %    Comment: Glycemic Control Guidelines for People with Diabetes:Non Diabetic:  <6%Goal of Therapy: <7%Additional Action Suggested:  >8%    General Appearance: alert, oriented, no acute distress, well nourished and obese  Musculoskeletal: Strength & Muscle Tone: within normal limits Gait & Station: normal Patient leans: N/A  Mental status examination: Patient is casually dressed and groomed.  She maintained fair eye contact.  Her speech is clear, fluent and coherent.  Her thought process slow but logical and goal-directed.  She denies any auditory or visual hallucination.  She denies any active or passive suicidal parts  or homicidal thought.  There were no delusions, paranoia or any obsessive thoughts.  Her psychomotor activity is slow.  She has no tremors, shakes or any EPS.  Her attention and concentration is fair.  Her cognition is good.  She is alert and oriented 3.  Her insight judgment and impulse control is okay.    Established Problem, Stable/Improving (1), Review of Psycho-Social Stressors (1), Review or order clinical lab tests (1), Review and summation of old records (2), New Problem, with no additional work-up planned (3), Review of Last Therapy Session (1) and Review of Medication Regimen & Side Effects (2)  Assessment: Axis I: Schizophrenia chronic paranoid type, anxiety disorder  Axis II: Deferred  Past Medical History  Diagnosis Date  . Asthma   . Chicken pox   . Allergy   . Hypertension   . Hyperlipidemia   . Thyroid disease      Plan:  I review her records, recent blood work results, current medication and psychosocial stressors.  She has gained weight from the past.  Discussed that her exercise and watch her calorie intake.  Her hemoglobin A1c is a stable , her CBC and basic chemistry is also stable.  Discuss in length medication side effects and benefits.  At this time she does not have  interest in counseling .  She wants to continue Haldol 2 mg at bedtime.  She has no tremors, shakes or any EPS.  Recommended to call us back if she has any question, concern or if she feels worsening of the symptom. Time spent 25 minutes.  More than 50% of the time spent in psychoeducation, counseling and coordination of care.  Discuss safety plan that anytime having active suicidal thoughts or homicidal thoughts then patient need to call 911 or go to the local emergency room.  Patient like to follow-up in 6 months, we will see her in 6 months.   Lylie Blacklock T., MD 12/30/2015

## 2016-02-05 ENCOUNTER — Other Ambulatory Visit: Payer: Self-pay | Admitting: Internal Medicine

## 2016-04-27 ENCOUNTER — Ambulatory Visit: Payer: Self-pay | Admitting: Internal Medicine

## 2016-04-29 ENCOUNTER — Other Ambulatory Visit: Payer: Self-pay | Admitting: Internal Medicine

## 2016-04-29 ENCOUNTER — Encounter: Payer: Self-pay | Admitting: Internal Medicine

## 2016-04-29 ENCOUNTER — Ambulatory Visit (INDEPENDENT_AMBULATORY_CARE_PROVIDER_SITE_OTHER): Payer: Medicare HMO | Admitting: Internal Medicine

## 2016-04-29 VITALS — BP 120/78 | HR 76 | Temp 98.4°F | Wt 251.0 lb

## 2016-04-29 DIAGNOSIS — E119 Type 2 diabetes mellitus without complications: Secondary | ICD-10-CM | POA: Diagnosis not present

## 2016-04-29 DIAGNOSIS — I10 Essential (primary) hypertension: Secondary | ICD-10-CM

## 2016-04-29 DIAGNOSIS — J302 Other seasonal allergic rhinitis: Secondary | ICD-10-CM

## 2016-04-29 DIAGNOSIS — F200 Paranoid schizophrenia: Secondary | ICD-10-CM

## 2016-04-29 DIAGNOSIS — Z1159 Encounter for screening for other viral diseases: Secondary | ICD-10-CM

## 2016-04-29 DIAGNOSIS — G4733 Obstructive sleep apnea (adult) (pediatric): Secondary | ICD-10-CM | POA: Insufficient documentation

## 2016-04-29 DIAGNOSIS — E039 Hypothyroidism, unspecified: Secondary | ICD-10-CM

## 2016-04-29 DIAGNOSIS — K122 Cellulitis and abscess of mouth: Secondary | ICD-10-CM

## 2016-04-29 DIAGNOSIS — E785 Hyperlipidemia, unspecified: Secondary | ICD-10-CM | POA: Diagnosis not present

## 2016-04-29 DIAGNOSIS — J4530 Mild persistent asthma, uncomplicated: Secondary | ICD-10-CM

## 2016-04-29 DIAGNOSIS — E669 Obesity, unspecified: Secondary | ICD-10-CM

## 2016-04-29 DIAGNOSIS — K219 Gastro-esophageal reflux disease without esophagitis: Secondary | ICD-10-CM

## 2016-04-29 LAB — HEMOGLOBIN A1C: Hgb A1c MFr Bld: 6.8 % — ABNORMAL HIGH (ref 4.6–6.5)

## 2016-04-29 LAB — COMPREHENSIVE METABOLIC PANEL
ALBUMIN: 4 g/dL (ref 3.5–5.2)
ALK PHOS: 97 U/L (ref 39–117)
ALT: 16 U/L (ref 0–35)
AST: 19 U/L (ref 0–37)
BUN: 12 mg/dL (ref 6–23)
CO2: 32 mEq/L (ref 19–32)
Calcium: 9.7 mg/dL (ref 8.4–10.5)
Chloride: 102 mEq/L (ref 96–112)
Creatinine, Ser: 1.09 mg/dL (ref 0.40–1.20)
GFR: 67.05 mL/min (ref >60.00)
Glucose, Bld: 119 mg/dL — ABNORMAL HIGH (ref 70–99)
POTASSIUM: 3.9 meq/L (ref 3.5–5.1)
Sodium: 139 mEq/L (ref 135–145)
TOTAL PROTEIN: 7.5 g/dL (ref 6.0–8.3)
Total Bilirubin: 0.2 mg/dL (ref 0.2–1.2)

## 2016-04-29 LAB — CBC
HCT: 31.7 % — ABNORMAL LOW (ref 36.0–46.0)
Hemoglobin: 10.3 g/dL — ABNORMAL LOW (ref 12.0–15.0)
MCHC: 32.5 g/dL (ref 30.0–36.0)
MCV: 80.4 fl (ref 78.0–100.0)
PLATELETS: 329 10*3/uL (ref 150.0–400.0)
RBC: 3.95 Mil/uL (ref 3.87–5.11)
RDW: 15.6 % — AB (ref 11.5–15.5)
WBC: 9 10*3/uL (ref 4.0–10.5)

## 2016-04-29 LAB — LIPID PANEL
CHOL/HDL RATIO: 3
CHOLESTEROL: 153 mg/dL (ref 0–200)
HDL: 54.8 mg/dL (ref >39.00)
LDL Cholesterol: 84 mg/dL (ref 0–99)
NonHDL: 98.25
TRIGLYCERIDES: 71 mg/dL (ref 0.0–149.0)
VLDL: 14.2 mg/dL (ref 0.0–40.0)

## 2016-04-29 LAB — TSH: TSH: 2.81 u[IU]/mL (ref 0.35–4.50)

## 2016-04-29 LAB — T4, FREE: Free T4: 0.81 ng/dL (ref 0.60–1.60)

## 2016-04-29 NOTE — Assessment & Plan Note (Signed)
She will continue to follow with Dr. Adele Schilder Continue Haldol

## 2016-04-29 NOTE — Assessment & Plan Note (Signed)
Will check CMET and Lipid Profile today Encouraged her to consume a low fat diet Continue Lipitor and Fish Oil for now

## 2016-04-29 NOTE — Progress Notes (Signed)
Pre visit review using our clinic review tool, if applicable. No additional management support is needed unless otherwise documented below in the visit note. 

## 2016-04-29 NOTE — Assessment & Plan Note (Signed)
Will check A1C and Lipid Profile today No microalbumin secondary to ACEI Encouraged her to consume a low fat, low carb diet Encouraged her to exercise to lose weight Will request eye exam from [facility] Flu and Pneumovax UTD

## 2016-04-29 NOTE — Assessment & Plan Note (Signed)
Controlled on Prilosec Discussed how weight loss could help improve her reflux CMET today

## 2016-04-29 NOTE — Assessment & Plan Note (Signed)
Will check TSH and T4 today Will adjust Synthroid as needed based on labs

## 2016-04-29 NOTE — Assessment & Plan Note (Signed)
Discussed trying to eat at least 3 meals per day High in protein, fresh veggies, low if carbs

## 2016-04-29 NOTE — Assessment & Plan Note (Signed)
Controlled on Advair Continue Albuterol prn

## 2016-04-29 NOTE — Assessment & Plan Note (Signed)
Controlled on Zyrtec and Flonase

## 2016-04-29 NOTE — Assessment & Plan Note (Signed)
No angina Will continue to monitor

## 2016-04-29 NOTE — Patient Instructions (Signed)

## 2016-04-29 NOTE — Progress Notes (Signed)
HPI:  Pt presents to the clinic today for follow up of chronic conditions.  Allergic Rhinitis: Her symptoms are usually worse in the spring. She takes Zyrtec and Flonase daily. She reports that she has polyps in her nose. She has seen ENT in the past for the same. She has had a polypectomy x 2 but they keep coming back.  Asthma: She takes Advair daily. She is using her Albuterol about 1 time per month. She does not follow with a pulmonologist.  Isabella Stalling angina: Denies chest pain. She does not follow with a cardiologist.  DM 2: her last A1C was 6.2%. She does not test her sugars. Her DM 2 is diet controlled. Her last eye exam was 10/2015. Flu 10/2015. Pneumovax 10/2014.  HLD: She denies myalgias on Lipitor. She takes Fish Oil as well. Her last LDL was 84. She does try to consume a low fat diet.  HTN: BP well controlled on Norvasc, Coreg, HCTZ and Lisinopril. She is also taking a potassium supplement daily. Her BP today is 120/78. ECG from 12/2013 reviewed.  Hypothyroidism: Her last TSH and T4 were WNL. She denies s/s of hypothyroidism on current dose of Synthroid.  Obesity: She has gained 15 lbs since her last visit. Her BMI today is 43.06.She does not adhere to any particular diet, and is no longer exercising. She reports she only eats 1 meal per day and snacks on carbs before dinner.  Paranoid Schizophrenia: She takes Haldol daily. She follows with Dr. Adele Schilder, at [facility].  GERD: Symptoms controlled on Prilosec.  OSA: She reports she gets 8-9 hours of sleep at night. She feels very fatigued during the day and often naps during the day. She tells me that she had a sleep study at [place] a few years back, and was told that she had sleep apnea, but she never followed through with the treatment.  She also wants to be screened for Hep C today.  Past Medical History  Diagnosis Date  . Asthma   . Chicken pox   . Allergy   . Hypertension   . Hyperlipidemia   . Thyroid  disease     Current Outpatient Prescriptions  Medication Sig Dispense Refill  . albuterol (VENTOLIN HFA) 108 (90 BASE) MCG/ACT inhaler Inhale 1-2 puffs into the lungs every 6 (six) hours as needed for wheezing or shortness of breath. 18 g 5  . amLODipine (NORVASC) 10 MG tablet TAKE ONE TABLET BY MOUTH ONCE DAILY 90 tablet 0  . atorvastatin (LIPITOR) 10 MG tablet Take 1 tablet (10 mg total) by mouth daily. 90 tablet 1  . B Complex-C (B-COMPLEX WITH VITAMIN C) tablet Take 1 tablet by mouth daily. 90 tablet 0  . carvedilol (COREG) 12.5 MG tablet Take 1 tablet (12.5 mg total) by mouth 2 (two) times daily with a meal. 180 tablet 1  . cetirizine (ZYRTEC) 10 MG tablet TAKE ONE TABLET BY MOUTH ONCE DAILY 90 tablet 1  . Fluticasone-Salmeterol (ADVAIR DISKUS) 250-50 MCG/DOSE AEPB INHALE ONE PUFF BY MOUTH TWICE DAILY. 120 each 1  . haloperidol (HALDOL) 2 MG tablet Take 1 tablet (2 mg total) by mouth at bedtime. 90 tablet 1  . hydrochlorothiazide (HYDRODIURIL) 25 MG tablet Take 1 tablet (25 mg total) by mouth daily. 90 tablet 1  . levothyroxine (SYNTHROID, LEVOTHROID) 50 MCG tablet TAKE ONE TABLET BY MOUTH ONCE DAILY BEFORE BREAKFAST 90 tablet 1  . lisinopril (PRINIVIL,ZESTRIL) 40 MG tablet Take 1 tablet (40 mg total) by mouth  daily. 90 tablet 1  . mometasone (NASONEX) 50 MCG/ACT nasal spray Place 2 sprays into the nose daily. 17 g 12  . Omega-3 Fatty Acids (FISH OIL PO) Take 1 capsule by mouth daily.    Marland Kitchen omeprazole (PRILOSEC) 20 MG capsule Take 1 capsule (20 mg total) by mouth daily. 90 capsule 1  . potassium chloride SA (KLOR-CON M20) 20 MEQ tablet Take 1 tablet (20 mEq total) by mouth 2 (two) times daily. 180 tablet 1  . tetrahydrozoline (VISINE) 0.05 % ophthalmic solution Place 2 drops into both eyes.     No current facility-administered medications for this visit.    Allergies  Allergen Reactions  . Pollen Extract Cough    Family History  Problem Relation Age of Onset  . Mental illness  Mother   . Hypertension Mother   . Schizophrenia Mother   . Depression Mother   . Cancer Father     LUNG  . Cancer Sister     BREAST  . Mental illness Brother   . Hypertension Brother   . Schizophrenia Brother   . Depression Brother   . Heart disease Maternal Grandmother   . Depression Maternal Grandmother   . Schizophrenia Maternal Grandmother   . Schizophrenia Brother   . Schizophrenia Maternal Uncle     Social History   Social History  . Marital Status: Married    Spouse Name: N/A  . Number of Children: N/A  . Years of Education: N/A   Occupational History  . Not on file.   Social History Main Topics  . Smoking status: Former Research scientist (life sciences)  . Smokeless tobacco: Not on file  . Alcohol Use: No  . Drug Use: No  . Sexual Activity: Not Currently   Other Topics Concern  . 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 Denies fever, malaise, headache.  HEENT: Pt reports watery eyes. Denies eye pain, eye redness, ear pain, ringing in the ears, wax buildup, runny nose, nasal congestion, bloody nose, or sore throat. Respiratory: Denies difficulty breathing, shortness of breath, cough or sputum production.   Cardiovascular: Denies chest pain, chest tightness, palpitations or swelling in the hands or feet.  Gastrointestinal: Denies abdominal pain, bloating, constipation, diarrhea or blood in the stool.  GU: Denies frequency, pain with urination, burning sensation, blood in urine, odor or discharge. Musculoskeletal: Denies decrease in range of motion, difficulty with gait, muscle pain or joint pain and swelling.  Skin: Denies redness, rashes, lesions or ulcercations.  Neurological: Denies dizziness, difficulty with memory, difficulty with speech or problems with balance and coordination.  Psych: Denies anxiety, depression,  SI/HI.  No other specific complaints in a complete review of systems (except as listed in HPI above).  Objective:  PE:   BP 120/78 mmHg  Pulse 76  Temp(Src) 98.4 F (36.9 C) (Oral)  Wt 251 lb (113.853 kg)  SpO2 98%  Wt Readings from Last 3 Encounters:  12/30/15 242 lb (109.77 kg)  10/28/15 234 lb (106.142 kg)  06/23/15 225 lb 9.6 oz (102.331 kg)    General: Appears her stated age, obese in NAD. HEENT: Head: normal shape and size; Eyes: sclera white, no icterus, conjunctiva pink, PERRLA and EOMs intact; Ears: Tm's gray and intact, normal light reflex; Throat/Mouth: Teeth present, mucosa pink and moist, no exudate, lesions or ulcerations noted.  Neck: Neck supple, trachea midline. No masses, lumps present. Thyromegaly noted. Cardiovascular: Normal rate and rhythm. S1,S2 noted.  No murmur, rubs or gallops noted. No JVD or BLE edema. No carotid bruits noted. Pulmonary/Chest: Normal effort and positive vesicular breath sounds. No respiratory distress. No wheezes, rales or ronchi noted.  Abdomen: Soft and nontender. Normal bowel sounds.   Neurological: Alert and oriented.  Psychiatric: Mood and affect normal. Behavior is normal. Judgment and thought content normal.    BMET    Component Value Date/Time   NA 139 10/28/2015 0927   K 4.0 10/28/2015 0927   CL 104 10/28/2015 0927   CO2 29 10/28/2015 0927   GLUCOSE 106* 10/28/2015 0927   BUN 15 10/28/2015 0927   CREATININE 1.20 10/28/2015 0927   CALCIUM 9.6 10/28/2015 0927   GFRNONAA 60* 01/01/2014 0330   GFRAA 70* 01/01/2014 0330    Lipid Panel     Component Value Date/Time   CHOL 173 10/28/2015 0927   TRIG 77.0 10/28/2015 0927   HDL 74.20 10/28/2015 0927   CHOLHDL 2 10/28/2015 0927   VLDL 15.4 10/28/2015 0927   LDLCALC 84 10/28/2015 0927    CBC    Component Value Date/Time   WBC 7.9 10/28/2015 0927   RBC 4.09 10/28/2015 0927   HGB 11.4* 10/28/2015 0927   HCT 34.3* 10/28/2015 0927   PLT 283.0 10/28/2015 0927   MCV  83.8 10/28/2015 0927   MCH 28.1 01/01/2014 0330   MCHC 33.2 10/28/2015 0927   RDW 15.2 10/28/2015 0927   LYMPHSABS 1.7 01/14/2014 1224   MONOABS 0.3 01/14/2014 1224   EOSABS 0.1 01/14/2014 1224   BASOSABS 0.0 01/14/2014 1224    Hgb A1C Lab Results  Component Value Date   HGBA1C 6.2 10/28/2015      Assessment and Plan:

## 2016-04-29 NOTE — Assessment & Plan Note (Signed)
May be a contributing factor for HTN and weight gain Will request copy of sleep study from [facility] Referral placed to pulm for further evaluation

## 2016-04-29 NOTE — Assessment & Plan Note (Signed)
Controlled on Norvasc, Coreg, HCTZ and Lisinopril CMET today

## 2016-04-30 ENCOUNTER — Encounter: Payer: Self-pay | Admitting: Internal Medicine

## 2016-04-30 LAB — HEPATITIS C ANTIBODY: HCV Ab: NEGATIVE

## 2016-05-02 ENCOUNTER — Other Ambulatory Visit: Payer: Self-pay | Admitting: Internal Medicine

## 2016-05-11 ENCOUNTER — Ambulatory Visit (INDEPENDENT_AMBULATORY_CARE_PROVIDER_SITE_OTHER): Payer: Medicare HMO | Admitting: Pulmonary Disease

## 2016-05-11 ENCOUNTER — Encounter: Payer: Self-pay | Admitting: Pulmonary Disease

## 2016-05-11 VITALS — BP 128/72 | HR 89 | Ht 64.0 in | Wt 248.0 lb

## 2016-05-11 DIAGNOSIS — J4530 Mild persistent asthma, uncomplicated: Secondary | ICD-10-CM | POA: Diagnosis not present

## 2016-05-11 DIAGNOSIS — G4733 Obstructive sleep apnea (adult) (pediatric): Secondary | ICD-10-CM | POA: Diagnosis not present

## 2016-05-11 NOTE — Progress Notes (Signed)
PULMONARY CONSULT NOTE  Requesting MD/Service: Garnette Gunner, NP Date of initial consultation: 05/11/16 Reason for consultation: OSA  PT PROFILE: 55 y.o. F with history of OSA first diagnosed in 2012 in [place] ([facility]). Has not used CPAP since moving to [place] area. C/O daytime hypersomnolence. 50 pound weight gain in past 4 yrs  HPI:  65 F referred for evaluation of OSA which was first diagnosed in 2012. She does not remember why that study was done and does not believe that she was suffering from hypersomnolence at that time. She did wear CPAP with a full face mask for a while but since moving to this area, she has not been using it as she does not have a supply company. She has gained 50# in the past 4 yrs. She now reports moderate daytime sleepiness. She falls asleep readily when sitting on the couch. She falls asleep immediately when she goes to bed. Her husband reports that she snores heavily. She does have nocturnal awakenings but is unable to discern why. Denies morning HAs.   Past Medical History  Diagnosis Date  . Asthma   . Chicken pox   . Allergy   . Hypertension   . Hyperlipidemia   . Thyroid disease     Past Surgical History  Procedure Laterality Date  . Sinus irrigation    . Tonsillectomy      MEDICATIONS: I have reviewed all medications and confirmed regimen as documented  Social History   Social History  . Marital Status: Married    Spouse Name: N/A  . Number of Children: N/A  . Years of Education: N/A   Occupational History  . Not on file.   Social History Main Topics  . Smoking status: Former Smoker -- 0.25 packs/day for 6 years  . Smokeless tobacco: Not on file     Comment: QUIT IN HER EARLY 20'S  . Alcohol Use: No  . Drug Use: No  . Sexual Activity: Not Currently   Other Topics Concern  . Not on file   Social History Narrative   3 CHILDREN   RECENTLY MOVED TO ,[place]   SEEKING EMPLOYMENT    Family History  Problem Relation Age of  Onset  . Mental illness Mother   . Hypertension Mother   . Schizophrenia Mother   . Depression Mother   . Cancer Father     LUNG  . Cancer Sister     BREAST  . Mental illness Brother   . Hypertension Brother   . Schizophrenia Brother   . Depression Brother   . Heart disease Maternal Grandmother   . Depression Maternal Grandmother   . Schizophrenia Maternal Grandmother   . Schizophrenia Brother   . Schizophrenia Maternal Uncle     ROS: No fever, myalgias/arthralgias, unexplained weight loss or weight gain No new focal weakness or sensory deficits No otalgia, hearing loss, visual changes, nasal and sinus symptoms, mouth and throat problems No neck pain or adenopathy No abdominal pain, N/V/D, diarrhea, change in bowel pattern No dysuria, change in urinary pattern No LE edema or calf tenderness   Filed Vitals:   05/11/16 0900  BP: 128/72  Pulse: 89  Height: 5\' 4"  (1.626 m)  Weight: 248 lb (112.492 kg)  SpO2: 98%     EXAM:  Gen: Obese, pleasant, No respiratory distress HEENT: NCAT, TMs and canals normal, sclera white, nares and nasal mucosa normal, oropharynx normal Neck: Supple without LAN, thyromegaly, JVD Lungs: breath sounds full, percussion note normal throughout, No adventitious  sounds Cardiovascular: Normal rate, reg rhythm, no murmurs noted Abdomen: centripetal obesity, soft, nontender, normal BS Ext: without clubbing, cyanosis, edema Neuro: CNs grossly intact, motor and sensory intact, DTRs symmetric Skin: Limited exam, no lesions noted  DATA:   BMP Latest Ref Rng 04/29/2016 10/28/2015 04/21/2015  Glucose 70 - 99 mg/dL 119(H) 106(H) 104(H)  BUN 6 - 23 mg/dL 12 15 15   Creatinine 0.40 - 1.20 mg/dL 1.09 1.20 1.21(H)  Sodium 135 - 145 mEq/L 139 139 137  Potassium 3.5 - 5.1 mEq/L 3.9 4.0 3.8  Chloride 96 - 112 mEq/L 102 104 104  CO2 19 - 32 mEq/L 32 29 28  Calcium 8.4 - 10.5 mg/dL 9.7 9.6 9.6    CBC Latest Ref Rng 04/29/2016 10/28/2015 04/21/2015  WBC 4.0 -  10.5 K/uL 9.0 7.9 6.6  Hemoglobin 12.0 - 15.0 g/dL 10.3(L) 11.4(L) 11.0(L)  Hematocrit 36.0 - 46.0 % 31.7(L) 34.3(L) 33.0(L)  Platelets 150.0 - 400.0 K/uL 329.0 283.0 258.0    CXR:  No film available  IMPRESSION:     ICD-9-CM ICD-10-CM   1. OSA (obstructive sleep apnea) 327.23 G47.33 Split night study  2. Morbid obesity, unspecified obesity type ([place]) 278.01 E66.01   3. Asthma, mild persistent, uncomplicated 123456 [id number]      PLAN:  We discussed strategies for weight loss:  No sugary drinks or juices  No simple carbohydrates  Try to eat "single-ingredient" foods  We have scheduled a split night sleep study  We will contact her with results of the sleep study and initiate therapy as indicated  Follow up in 6 weeks   Merton Border, MD PCCM service Mobile [phone] Pager [phone] 05/11/2016

## 2016-05-11 NOTE — Patient Instructions (Signed)
We discussed strategies for weight loss:  No sugary drinks or juices  No simple carbohydrates  Try to eat "single-ingredient" foods  We have scheduled a split night sleep study  We will contact you with results of the sleep study  Follow up in 6 weeks

## 2016-05-21 ENCOUNTER — Encounter: Payer: Self-pay | Admitting: Family Medicine

## 2016-05-21 ENCOUNTER — Ambulatory Visit (INDEPENDENT_AMBULATORY_CARE_PROVIDER_SITE_OTHER): Payer: Medicare HMO | Admitting: Family Medicine

## 2016-05-21 ENCOUNTER — Ambulatory Visit: Payer: Self-pay | Admitting: Internal Medicine

## 2016-05-21 ENCOUNTER — Ambulatory Visit: Payer: Self-pay | Admitting: Family Medicine

## 2016-05-21 VITALS — BP 116/78 | HR 83 | Temp 99.0°F | Ht 64.0 in | Wt 247.8 lb

## 2016-05-21 DIAGNOSIS — N63 Unspecified lump in breast: Secondary | ICD-10-CM

## 2016-05-21 DIAGNOSIS — N6310 Unspecified lump in the right breast, unspecified quadrant: Secondary | ICD-10-CM

## 2016-05-21 NOTE — Assessment & Plan Note (Signed)
Likely fibroadenoma or cyst given pt history. Wills end for breast imaging.

## 2016-05-21 NOTE — Addendum Note (Signed)
Addended by: Carter Kitten on: 05/21/2016 10:12 AM   Modules accepted: Orders

## 2016-05-21 NOTE — Progress Notes (Signed)
   Subjective:    Patient ID: Maria Franklin, female    DOB: [birth year], 55 y.o.   MRN: [id number]  HPI  55 year old female pt of [place] Baity's with history of DM, obesity and controlled paranoid schizophrenia presents with new mess in  Breast.   She noted a lump in right breast 2 days ago during a home breast exam. Area is non tender, no change in size,  Area is mobile.  No skin changes, no redness, no warmth.  No nipple discharge.   She has a history of fibrocystic breast issues noted on [place] in past.reast disease. .  Last mammogram: Nml 07/29/2015  No family or personal history of breast cancer except in Aunt..   Review of Systems  Constitutional: Negative for fever and fatigue.  HENT: Negative for ear pain.   Eyes: Negative for pain.  Respiratory: Negative for chest tightness and shortness of breath.   Cardiovascular: Negative for chest pain, palpitations and leg swelling.  Gastrointestinal: Negative for abdominal pain.  Genitourinary: Negative for dysuria.       Objective:   Physical Exam  Constitutional: Vital signs are normal. She appears well-developed and well-nourished. She is cooperative.  Non-toxic appearance. She does not appear ill. No distress.  Obese in NAD  HENT:  Head: Normocephalic.  Right Ear: Hearing, tympanic membrane, external ear and ear canal normal. Tympanic membrane is not erythematous, not retracted and not bulging.  Left Ear: Hearing, tympanic membrane, external ear and ear canal normal. Tympanic membrane is not erythematous, not retracted and not bulging.  Nose: No mucosal edema or rhinorrhea. Right sinus exhibits no maxillary sinus tenderness and no frontal sinus tenderness. Left sinus exhibits no maxillary sinus tenderness and no frontal sinus tenderness.  Mouth/Throat: Uvula is midline, oropharynx is clear and moist and mucous membranes are normal.  Eyes: Conjunctivae, EOM and lids are normal. Pupils are equal, round, and reactive to light. Lids  are everted and swept, no foreign bodies found.  Neck: Trachea normal and normal range of motion. Neck supple. Carotid bruit is not present. No thyroid mass and no thyromegaly present.  Cardiovascular: Normal rate, regular rhythm, S1 normal, S2 normal, normal heart sounds, intact distal pulses and normal pulses.  Exam reveals no gallop and no friction rub.   No murmur heard. Pulmonary/Chest: Effort normal and breath sounds normal. No tachypnea. No respiratory distress. She has no decreased breath sounds. She has no wheezes. She has no rhonchi. She has no rales.  Abdominal: Soft. Normal appearance and bowel sounds are normal. There is no tenderness.  Genitourinary: There is breast swelling. No breast tenderness, discharge or bleeding.  Mobile marble size mass, non-tender at 7 o'clock in right breast beside nipple, no skin changes , no nipples discharge expressed.  Neurological: She is alert.  Skin: Skin is warm, dry and intact. No rash noted.  Psychiatric: Her speech is normal and behavior is normal. Judgment and thought content normal. Her mood appears not anxious. Cognition and memory are normal. She does not exhibit a depressed mood.          Assessment & Plan:

## 2016-05-21 NOTE — Progress Notes (Signed)
Pre visit review using our clinic review tool, if applicable. No additional management support is needed unless otherwise documented below in the visit note. 

## 2016-05-21 NOTE — Patient Instructions (Signed)
Stop at front desk to get set up for breast imaging.

## 2016-05-27 ENCOUNTER — Ambulatory Visit
Admission: RE | Admit: 2016-05-27 | Discharge: 2016-05-27 | Disposition: A | Discharge status: Home / Self Care | Payer: Medicare HMO | Source: Ambulatory Visit | Attending: Family Medicine | Admitting: Family Medicine

## 2016-05-27 DIAGNOSIS — N6310 Unspecified lump in the right breast, unspecified quadrant: Secondary | ICD-10-CM

## 2016-06-10 ENCOUNTER — Ambulatory Visit (HOSPITAL_BASED_OUTPATIENT_CLINIC_OR_DEPARTMENT_OTHER): Payer: Medicare HMO | Attending: Pulmonary Disease | Admitting: Internal Medicine

## 2016-06-10 DIAGNOSIS — Z79899 Other long term (current) drug therapy: Secondary | ICD-10-CM | POA: Insufficient documentation

## 2016-06-10 DIAGNOSIS — R0683 Snoring: Secondary | ICD-10-CM | POA: Insufficient documentation

## 2016-06-10 DIAGNOSIS — G4733 Obstructive sleep apnea (adult) (pediatric): Secondary | ICD-10-CM | POA: Diagnosis not present

## 2016-06-10 DIAGNOSIS — G4736 Sleep related hypoventilation in conditions classified elsewhere: Secondary | ICD-10-CM | POA: Insufficient documentation

## 2016-06-16 ENCOUNTER — Other Ambulatory Visit: Payer: Self-pay | Admitting: Internal Medicine

## 2016-06-18 ENCOUNTER — Other Ambulatory Visit: Payer: Self-pay | Admitting: Internal Medicine

## 2016-06-18 DIAGNOSIS — Z1231 Encounter for screening mammogram for malignant neoplasm of breast: Secondary | ICD-10-CM

## 2016-06-19 DIAGNOSIS — G4733 Obstructive sleep apnea (adult) (pediatric): Secondary | ICD-10-CM | POA: Diagnosis not present

## 2016-06-19 NOTE — Procedures (Signed)
   Patient Name: Maria Franklin, Maria Franklin Date: 06/10/2016 Gender: Female D.O.B: [birth year] Age (years): 55 Referring Provider: Wilhelmina Mcardle Height (inches): 64 Interpreting Physician: Baird Lyons MD, ABSM Weight (lbs): 245 RPSGT: Gerhard Perches BMI: 42 MRN: [id number] Neck Size: 15.50 CLINICAL INFORMATION Sleep Study Type: NPSG   Indication for sleep study: OSA   Epworth Sleepiness Score:  11/24   SLEEP STUDY TECHNIQUE As per the AASM Manual for the Scoring of Sleep and Associated Events v2.3 (April 2016) with a hypopnea requiring 4% desaturations. The channels recorded and monitored were frontal, central and occipital EEG, electrooculogram (EOG), submentalis EMG (chin), nasal and oral airflow, thoracic and abdominal wall motion, anterior tibialis EMG, snore microphone, electrocardiogram, and pulse oximetry.  MEDICATIONS Patient's medications include: charted for review . Medications self-administered by patient during sleep study : .POTASSIUM CHLORIDE SA, LISINOPRIL, CARVEDILOL, HALOPERIDOL  SLEEP ARCHITECTURE The study was initiated at 10:05:07 PM and ended at 5:07:15 AM. Sleep onset time was 10.4 minutes and the sleep efficiency was 76.6%. The total sleep time was 323.5 minutes. Stage REM latency was 236.0 minutes. The patient spent 4.02% of the night in stage N1 sleep, 76.66% in stage N2 sleep, 0.31% in stage N3 and 19.01% in REM. Alpha intrusion was absent. Supine sleep was 10.06%. Wake after sleep onset 88 minutes  RESPIRATORY PARAMETERS The overall apnea/hypopnea index (AHI) was 11.3 per hour. There were 12 total apneas, including 12 obstructive, 0 central and 0 mixed apneas. There were 49 hypopneas and 11 RERAs. The AHI during Stage REM sleep was 55.6 per hour. AHI while supine was 0.0 per hour. The mean oxygen saturation was 93.29%. The minimum SpO2 during sleep was 77.00%. Moderate snoring was noted during this study.  CARDIAC DATA The 2 lead EKG  demonstrated sinus rhythm. The mean heart rate was 73.73 beats per minute. Other EKG findings include: None.  LEG MOVEMENT DATA The total PLMS were 0 with a resulting PLMS index of 0.00. Associated arousal with leg movement index was 0.0 .  IMPRESSIONS - Mild obstructive sleep apnea occurred during this study (AHI = 11.3/h). - There were not enough early events to meet protocol requirements for split CPAP titration on this study night - No significant central sleep apnea occurred during this study (CAI = 0.0/h). - Moderate oxygen desaturation was noted during this study (Min O2 = 77.00%). - The patient snored with Moderate snoring volume. - No cardiac abnormalities were noted during this study. - Clinically significant periodic limb movements did not occur during sleep. No significant associated arousals.  DIAGNOSIS - Obstructive Sleep Apnea (327.23 [G47.33 ICD-10]) - Nocturnal Hypoxemia (327.26 [G47.36 ICD-10])  RECOMMENDATIONS - CPAP titration or a fitted dental oral appliance may be among clinical options, based on clinical judgment. - Avoid alcohol, sedatives and other CNS depressants that may worsen sleep apnea and disrupt normal sleep architecture. - Sleep hygiene should be reviewed to assess factors that may improve sleep quality. - Weight management and regular exercise should be initiated or continued if appropriate.  [Electronically signed] 06/19/2016 09:53 AM  Baird Lyons MD, ABSM Diplomate, American Board of Sleep Medicine   NPI: [id number]  [place], American Board of Sleep Medicine  ELECTRONICALLY SIGNED ON:  06/19/2016, 9:47 AM [place] PH: [phone]   FX: [phone] [place]

## 2016-06-28 ENCOUNTER — Ambulatory Visit (HOSPITAL_COMMUNITY): Payer: Self-pay | Admitting: Psychiatry

## 2016-07-01 ENCOUNTER — Ambulatory Visit (INDEPENDENT_AMBULATORY_CARE_PROVIDER_SITE_OTHER): Payer: Medicare HMO | Admitting: Pulmonary Disease

## 2016-07-01 ENCOUNTER — Encounter: Payer: Self-pay | Admitting: Pulmonary Disease

## 2016-07-01 VITALS — BP 132/88 | HR 85 | Ht 64.0 in | Wt 248.0 lb

## 2016-07-01 DIAGNOSIS — E669 Obesity, unspecified: Secondary | ICD-10-CM

## 2016-07-01 DIAGNOSIS — G4733 Obstructive sleep apnea (adult) (pediatric): Secondary | ICD-10-CM | POA: Diagnosis not present

## 2016-07-03 NOTE — Progress Notes (Signed)
PULMONARY OFFCIE FOLLOW UP NOTE  Requesting MD/Service: Garnette Gunner, NP Date of initial consultation: 05/11/16 Reason for consultation: OSA  PT PROFILE: 55 y.o. F with history of OSA first diagnosed in 2012 in [place] ([facility]). Has not used CPAP since moving to [place] area. C/O daytime hypersomnolence. 50 pound weight gain in past 4 yrs  DATA: 06/10/16 Sleep study: Did not meet criteria for split night study. Overall AHI 11.3/hr. REM AHI 55.6/hr. Desat to low of 77%  SUBJ: No new complaints. Here to review results of sleep study    Vitals:   07/01/16 0844  BP: 132/88  Pulse: 85  SpO2: 97%  Weight: 248 lb (112.5 kg)  Height: 5\' 4"  (1.626 m)     EXAM:  Gen: Obese, no distress HEENT: WNL Neck: Supple without LAN, thyromegaly, JVD Lungs: breath sounds full, No adventitious sounds Cardiovascular: RRR s M Abdomen: centripetal obesity, soft, nontender, normal BS Ext: without clubbing, cyanosis, edema Neuro: grossly intact  DATA:   BMP Latest Ref Rng & Units 04/29/2016 10/28/2015 04/21/2015  Glucose 70 - 99 mg/dL 119(H) 106(H) 104(H)  BUN 6 - 23 mg/dL 12 15 15   Creatinine 0.40 - 1.20 mg/dL 1.09 1.20 1.21(H)  Sodium 135 - 145 mEq/L 139 139 137  Potassium 3.5 - 5.1 mEq/L 3.9 4.0 3.8  Chloride 96 - 112 mEq/L 102 104 104  CO2 19 - 32 mEq/L 32 29 28  Calcium 8.4 - 10.5 mg/dL 9.7 9.6 9.6    CBC Latest Ref Rng & Units 04/29/2016 10/28/2015 04/21/2015  WBC 4.0 - 10.5 K/uL 9.0 7.9 6.6  Hemoglobin 12.0 - 15.0 g/dL 10.3(L) 11.4(L) 11.0(L)  Hematocrit 36.0 - 46.0 % 31.7(L) 34.3(L) 33.0(L)  Platelets 150.0 - 400.0 K/uL 329.0 283.0 258.0    CXR:  No film available  IMPRESSION:     ICD-9-CM ICD-10-CM   1. OSA (obstructive sleep apnea) 327.23 G47.33 Ambulatory Referral for DME  2. Obesity 278.00 E66.9      PLAN:  Reiterated role of weight loss and discussed strategies Begin CPAP with autoset autotitration machine Follow up in 6 weeks   Merton Border, MD PCCM service Mobile  [phone] Pager [phone] 07/03/2016

## 2016-07-12 ENCOUNTER — Other Ambulatory Visit (HOSPITAL_COMMUNITY): Payer: Self-pay | Admitting: Psychiatry

## 2016-07-12 DIAGNOSIS — F200 Paranoid schizophrenia: Secondary | ICD-10-CM

## 2016-07-13 ENCOUNTER — Other Ambulatory Visit (HOSPITAL_COMMUNITY): Payer: Self-pay

## 2016-07-13 DIAGNOSIS — F200 Paranoid schizophrenia: Secondary | ICD-10-CM

## 2016-07-13 MED ORDER — HALOPERIDOL 2 MG PO TABS: 2.0000 mg | ORAL_TABLET | Freq: Every day | ORAL | 1 refills | Status: DC

## 2016-07-29 ENCOUNTER — Ambulatory Visit: Payer: Self-pay

## 2016-08-15 ENCOUNTER — Other Ambulatory Visit: Payer: Self-pay | Admitting: Internal Medicine

## 2016-08-16 ENCOUNTER — Ambulatory Visit
Admission: RE | Admit: 2016-08-16 | Discharge: 2016-08-16 | Disposition: A | Discharge status: Home / Self Care | Payer: Medicare Other | Source: Ambulatory Visit | Attending: Internal Medicine | Admitting: Internal Medicine

## 2016-08-16 DIAGNOSIS — Z1231 Encounter for screening mammogram for malignant neoplasm of breast: Secondary | ICD-10-CM | POA: Diagnosis not present

## 2016-08-24 ENCOUNTER — Ambulatory Visit (HOSPITAL_COMMUNITY): Payer: Self-pay | Admitting: Psychiatry

## 2016-09-05 ENCOUNTER — Other Ambulatory Visit: Payer: Self-pay | Admitting: Internal Medicine

## 2016-09-07 ENCOUNTER — Other Ambulatory Visit: Payer: Self-pay | Admitting: Internal Medicine

## 2016-09-10 ENCOUNTER — Other Ambulatory Visit (HOSPITAL_COMMUNITY): Payer: Self-pay | Admitting: Psychiatry

## 2016-09-10 DIAGNOSIS — F200 Paranoid schizophrenia: Secondary | ICD-10-CM

## 2016-09-20 ENCOUNTER — Ambulatory Visit (INDEPENDENT_AMBULATORY_CARE_PROVIDER_SITE_OTHER): Payer: Medicare Other | Admitting: Pulmonary Disease

## 2016-09-20 ENCOUNTER — Ambulatory Visit: Payer: Self-pay | Admitting: Pulmonary Disease

## 2016-09-20 ENCOUNTER — Encounter: Payer: Self-pay | Admitting: Pulmonary Disease

## 2016-09-20 VITALS — BP 132/76 | HR 86 | Ht 64.0 in | Wt 256.6 lb

## 2016-09-20 DIAGNOSIS — G4733 Obstructive sleep apnea (adult) (pediatric): Secondary | ICD-10-CM

## 2016-09-20 NOTE — Progress Notes (Signed)
PULMONARY OFFCIE FOLLOW UP NOTE  Requesting MD/Service: Garnette Gunner, NP Date of initial consultation: 05/11/16 Reason for consultation: OSA  PT PROFILE: 55 y.o. F with history of OSA first diagnosed in 2012 in [place] ([place]). Has not used CPAP since moving to [place] area. C/O daytime hypersomnolence. 50 pound weight gain in past 4 yrs  DATA: 06/10/16 Sleep study: Did not meet criteria for split night study. Overall AHI 11.3/hr. REM AHI 55.6/hr. Desat to low of 77% 07/01/16 Autoset CPAP initiated 0916-09/19/16 CPAP compliance: 26/30 night. Greater than 4 hours 25 nights  SUBJ: Routine follow up to assess CPAP complaince. She is wearing compliantly without problems and feels CPAP is beneficial. Better rested. No new complaints. She continues to gain weight despite an awareness that obesity is contributing to most of her health problems   Vitals:   09/20/16 0947  BP: 132/76  Pulse: 86  SpO2: 97%  Weight: 256 lb 9.6 oz (116.4 kg)  Height: 5\' 4"  (1.626 m)    EXAM:  Gen: Obese, pleasant, no distress HEENT: WNL Neck: Supple without LAN Lungs: breath sounds full, No adventitious sounds Cardiovascular: RRR s M Abdomen: centripetal obesity, nontender, normal BS Ext: without clubbing, cyanosis, edema Neuro: grossly intact  DATA:  No new labs or CXR  CXR:    IMPRESSION:   1) OSA - well controlled o nCPA 2) Obesity with continued weight gain   PLAN:  1) Continue CPAP 2) We discussed in detail dietary weight loss strategies. I emphasized the importance of eating real food in its most natural state with a restriction of simple sugars and carbohydrates 3) Follow up in 3 months with target weight of 240#   Merton Border, MD PCCM service Mobile [phone] Pager [phone] 09/20/2016

## 2016-09-20 NOTE — Patient Instructions (Addendum)
I am pleased with your complaince with the CPAP - keep it up  I am concerned about your weight and weight gain  We discussed dietary strategies to lose weight and I provided a list of foods. Mostly, I want you to focus on restricting sugars and simple carbohydrates  Follow up in 3 months with target weight of 240 pounds

## 2016-11-01 ENCOUNTER — Other Ambulatory Visit: Payer: Self-pay | Admitting: Internal Medicine

## 2016-11-01 DIAGNOSIS — J302 Other seasonal allergic rhinitis: Secondary | ICD-10-CM

## 2016-11-02 ENCOUNTER — Ambulatory Visit: Payer: Self-pay | Admitting: Internal Medicine

## 2016-11-04 ENCOUNTER — Encounter: Payer: Self-pay | Admitting: Internal Medicine

## 2016-11-04 ENCOUNTER — Ambulatory Visit (INDEPENDENT_AMBULATORY_CARE_PROVIDER_SITE_OTHER): Payer: Medicare HMO | Admitting: Internal Medicine

## 2016-11-04 VITALS — BP 130/78 | HR 68 | Temp 98.9°F | Ht 63.0 in | Wt 245.5 lb

## 2016-11-04 DIAGNOSIS — F200 Paranoid schizophrenia: Secondary | ICD-10-CM

## 2016-11-04 DIAGNOSIS — Z23 Encounter for immunization: Secondary | ICD-10-CM

## 2016-11-04 DIAGNOSIS — K122 Cellulitis and abscess of mouth: Secondary | ICD-10-CM

## 2016-11-04 DIAGNOSIS — I10 Essential (primary) hypertension: Secondary | ICD-10-CM | POA: Diagnosis not present

## 2016-11-04 DIAGNOSIS — Z114 Encounter for screening for human immunodeficiency virus [HIV]: Secondary | ICD-10-CM

## 2016-11-04 DIAGNOSIS — G4733 Obstructive sleep apnea (adult) (pediatric): Secondary | ICD-10-CM

## 2016-11-04 DIAGNOSIS — J301 Allergic rhinitis due to pollen: Secondary | ICD-10-CM

## 2016-11-04 DIAGNOSIS — K219 Gastro-esophageal reflux disease without esophagitis: Secondary | ICD-10-CM

## 2016-11-04 DIAGNOSIS — M792 Neuralgia and neuritis, unspecified: Secondary | ICD-10-CM | POA: Diagnosis not present

## 2016-11-04 DIAGNOSIS — E7800 Pure hypercholesterolemia, unspecified: Secondary | ICD-10-CM | POA: Diagnosis not present

## 2016-11-04 DIAGNOSIS — IMO0001 Reserved for inherently not codable concepts without codable children: Secondary | ICD-10-CM

## 2016-11-04 DIAGNOSIS — E039 Hypothyroidism, unspecified: Secondary | ICD-10-CM | POA: Diagnosis not present

## 2016-11-04 DIAGNOSIS — Z0000 Encounter for general adult medical examination without abnormal findings: Secondary | ICD-10-CM

## 2016-11-04 DIAGNOSIS — E119 Type 2 diabetes mellitus without complications: Secondary | ICD-10-CM | POA: Diagnosis not present

## 2016-11-04 DIAGNOSIS — Z6841 Body Mass Index (BMI) 40.0 and over, adult: Secondary | ICD-10-CM

## 2016-11-04 DIAGNOSIS — J4530 Mild persistent asthma, uncomplicated: Secondary | ICD-10-CM

## 2016-11-04 DIAGNOSIS — R69 Illness, unspecified: Secondary | ICD-10-CM | POA: Diagnosis not present

## 2016-11-04 DIAGNOSIS — E6609 Other obesity due to excess calories: Secondary | ICD-10-CM

## 2016-11-04 LAB — COMPREHENSIVE METABOLIC PANEL
ALBUMIN: 4.2 g/dL (ref 3.5–5.2)
ALK PHOS: 97 U/L (ref 39–117)
ALT: 18 U/L (ref 0–35)
AST: 19 U/L (ref 0–37)
BILIRUBIN TOTAL: 0.3 mg/dL (ref 0.2–1.2)
BUN: 16 mg/dL (ref 6–23)
CALCIUM: 9.8 mg/dL (ref 8.4–10.5)
CO2: 32 mEq/L (ref 19–32)
CREATININE: 1.19 mg/dL (ref 0.40–1.20)
Chloride: 103 mEq/L (ref 96–112)
GFR: 60.47 mL/min (ref >60.00)
Glucose, Bld: 115 mg/dL — ABNORMAL HIGH (ref 70–99)
Potassium: 3.9 mEq/L (ref 3.5–5.1)
Sodium: 142 mEq/L (ref 135–145)
TOTAL PROTEIN: 7.7 g/dL (ref 6.0–8.3)

## 2016-11-04 LAB — CBC
HCT: 34.8 % — ABNORMAL LOW (ref 36.0–46.0)
Hemoglobin: 11.5 g/dL — ABNORMAL LOW (ref 12.0–15.0)
MCHC: 33 g/dL (ref 30.0–36.0)
MCV: 81.9 fl (ref 78.0–100.0)
Platelets: 284 10*3/uL (ref 150.0–400.0)
RBC: 4.24 Mil/uL (ref 3.87–5.11)
RDW: 14.9 % (ref 11.5–15.5)
WBC: 8.4 10*3/uL (ref 4.0–10.5)

## 2016-11-04 LAB — TSH: TSH: 2.05 u[IU]/mL (ref 0.35–4.50)

## 2016-11-04 LAB — HEMOGLOBIN A1C: Hgb A1c MFr Bld: 6.4 % (ref 4.6–6.5)

## 2016-11-04 LAB — LIPID PANEL
CHOL/HDL RATIO: 3
Cholesterol: 163 mg/dL (ref 0–200)
HDL: 62.1 mg/dL (ref >39.00)
LDL Cholesterol: 83 mg/dL (ref 0–99)
NonHDL: 100.52
TRIGLYCERIDES: 87 mg/dL (ref 0.0–149.0)
VLDL: 17.4 mg/dL (ref 0.0–40.0)

## 2016-11-04 LAB — VITAMIN D 25 HYDROXY (VIT D DEFICIENCY, FRACTURES): VITD: 31.63 ng/mL (ref 30.00–100.00)

## 2016-11-04 LAB — VITAMIN B12: Vitamin B-12: 826 pg/mL (ref 211–911)

## 2016-11-04 LAB — T4, FREE: FREE T4: 0.75 ng/dL (ref 0.60–1.60)

## 2016-11-04 LAB — FOLATE: Folate: >23.4 ng/mL (ref >5.9)

## 2016-11-04 NOTE — Assessment & Plan Note (Signed)
No angina Will monitor 

## 2016-11-04 NOTE — Assessment & Plan Note (Signed)
Seems to be deteriorating Now with some peripheral neuropathy- not sure if diabetes related, will check B12, Folate and Vit D as well Encouraged her to consume a low carb diet and continue to exercise for weight loss Advised her to continue yearly eye exams Immunizations UTD Foot exam today If A1c > 7%, will start Metformin

## 2016-11-04 NOTE — Assessment & Plan Note (Signed)
Controlled on Zyrtec and Flonase daily

## 2016-11-04 NOTE — Assessment & Plan Note (Signed)
Discussed avoiding triggers Encouraged her to continue to work on weight loss Continue Prilosec for now

## 2016-11-04 NOTE — Progress Notes (Signed)
HPI:  Pt presents to the clinic today for her Medicare Wellness Exam. She is also due for follow up of chronic conditions.   Allergic Rhinitis: Her symptoms are usually worse in the spring. She takes Zyrtec and Flonase daily. She reports that she has polyps in her nose. She has seen ENT in the past for the same. She has had a polypectomy x 2 but they keep coming back. She has not had any issues with this since her last visit.    Asthma: She takes Advair daily. She is using her Albuterol about 1 time per month and denies increased frequency of use of her rescue inhaler. She is now following with Dr. Gilberto Better.   [place]udwig angina: She denies any recent chest pain. She does not follow with a cardiologist.   DM 2: her last A1C was 6.8%, 04/2016. She does not test her sugars. She does not take any medication for her DM 2. She has started to notice numbness in her great toes bilaterally, L>R. Her last eye exam was 10/2015 and she reports they told her that she did not need a diabetic eye exam. Flu 10/2015, she wants one today. Pneumovax 10/2014.   HLD: She denies myalgias on Lipitor. She takes Fish Oil as well. Her last LDL was 84, 04/2016. She does occasionally eat fried foods.   HTN: BP well controlled on Norvasc, Coreg, HCTZ and Lisinopril. She is also taking a potassium supplement daily. Her BP today is 130/78. ECG from 12/2013 reviewed.   Hypothyroidism: Her last TSH and T4 were WNL, 04/2016. She denies s/s of hypothyroidism on current dose of Synthroid.   Obesity: She has lost 12 lbs since her last visit. Her BMI today is 43.49.   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 She takes Haldol daily. She follows with Dr. Adele Schilder, at [facility].   GERD: Triggered by eating late at night. Her symptoms controlled on Prilosec. She does not take anything additional for breakthrough symptoms.    OSA: She had a sleep study, done by Dr. Gilberto Better. She reports she is now wearing a CPAP  machine and feels much less fatigued that prior.   Past Medical History:  Diagnosis Date  . Allergy   . Asthma   . Chicken pox   . Hyperlipidemia   . Hypertension   . Thyroid disease     Current Outpatient Prescriptions  Medication Sig Dispense Refill  . amLODipine (NORVASC) 10 MG tablet TAKE ONE TABLET BY MOUTH ONCE DAILY 90 tablet 0  . atorvastatin (LIPITOR) 10 MG tablet TAKE ONE TABLET BY MOUTH ONCE DAILY 90 tablet 1  . B Complex-C (B-COMPLEX WITH VITAMIN C) tablet Take 1 tablet by mouth daily. 90 tablet 0  . carvedilol (COREG) 12.5 MG tablet TAKE ONE TABLET BY MOUTH TWICE DAILY WITH A MEAL 180 tablet 3  . cetirizine (ZYRTEC) 10 MG tablet TAKE ONE TABLET BY MOUTH ONCE DAILY 90 tablet 1  . Fluticasone-Salmeterol (ADVAIR DISKUS) 250-50 MCG/DOSE AEPB INHALE ONE PUFF BY MOUTH TWICE DAILY. 120 each 1  . haloperidol (HALDOL) 2 MG tablet TAKE ONE TABLET BY MOUTH AT BEDTIME 30 tablet 1  . hydrochlorothiazide (HYDRODIURIL) 25 MG tablet TAKE ONE TABLET BY MOUTH ONCE DAILY 90 tablet 0  . KLOR-CON M20 20 MEQ tablet TAKE ONE TABLET BY MOUTH TWICE DAILY 180 tablet 1  . levothyroxine (SYNTHROID, LEVOTHROID) 50 MCG tablet TAKE ONE TABLET BY MOUTH ONCE DAILY BEFORE BREAKFAST 90 tablet 0  . lisinopril (PRINIVIL,ZESTRIL) 40 MG tablet  TAKE ONE TABLET BY MOUTH ONCE DAILY 90 tablet 3  . NASONEX 50 MCG/ACT nasal spray USE TWO SPRAY(S) IN EACH NOSTRIL ONCE DAILY 17 g 11  . Omega-3 Fatty Acids (FISH OIL PO) Take 1 capsule by mouth daily.    Marland Kitchen omeprazole (PRILOSEC) 20 MG capsule TAKE ONE CAPSULE BY MOUTH ONCE DAILY 90 capsule 3  . tetrahydrozoline (VISINE) 0.05 % ophthalmic solution Place 2 drops into both eyes.    . VENTOLIN HFA 108 (90 Base) MCG/ACT inhaler INHALE ONE TO TWO PUFFS BY MOUTH EVERY 6 HOURS AS NEEDED FOR WHEEZING OR SHORTNESS OF BREATH 18 g 2   No current facility-administered medications for this visit.     Allergies  Allergen Reactions  . Pollen Extract Cough    Family History   Problem Relation Age of Onset  . Mental illness Mother   . Hypertension Mother   . Schizophrenia Mother   . Depression Mother   . Cancer Father     LUNG  . Cancer Sister     BREAST  . Mental illness Brother   . Hypertension Brother   . Schizophrenia Brother   . Depression Brother   . Heart disease Maternal Grandmother   . Depression Maternal Grandmother   . Schizophrenia Maternal Grandmother   . Schizophrenia Brother   . Schizophrenia Maternal Uncle     Social History   Social History  . Marital status: Married    Spouse name: N/A  . Number of children: N/A  . Years of education: N/A   Occupational History  . Not on file.   Social History Main Topics  . Smoking status: Former Smoker    Packs/day: 0.25    Years: 6.00  . Smokeless tobacco: Never Used     Comment: QUIT IN HER EARLY 20'S  . Alcohol use No  . Drug use: No  . Sexual activity: Not Currently   Other Topics Concern  . Not on file   Social History Narrative   3 CHILDREN   RECENTLY MOVED TO [place]: None  Health Maintenance:    Flu: 10/2015  Tetanus: 04/2016  Pneumovax: 10/2014  Mammogram: 08/2016  Pap Smear: 01/2014  Colon Screening: 12/2011  Eye Doctor: 10/2015  Dental Exam: as needed   Providers:   PCP: Webb Silversmith, NP-C  Psychiatrist: Dr. Adele Schilder  Pulmonologist: Dr. Gilberto Better   I have personally reviewed and have noted:  1. The patient's medical and social history 2. Their use of alcohol, tobacco or illicit drugs 3. Their current medications and supplements 4. The patient's functional ability including ADL's, fall risks, home safety risks and hearing or visual impairment. 5. Diet and physical activities 6. Evidence for depression or mood disorder  Subjective:   Review of Systems:   Constitutional: Pt reports fatigue. Denies fever, malaise, headache or abrupt weight changes.  HEENT: Pt reports difficulty hearing in a crowd. Denies eye  pain, eye redness, ear pain, ringing in the ears, wax buildup, runny nose, nasal congestion, bloody nose, or sore throat. Respiratory: Denies difficulty breathing, shortness of breath, cough or sputum production.   Cardiovascular: Denies chest pain, chest tightness, palpitations or swelling in the hands or feet.  Gastrointestinal: Denies abdominal pain, bloating, constipation, diarrhea or blood in the stool.  GU: Denies urgency, frequency, pain with urination, burning sensation, blood in urine, odor or discharge. Musculoskeletal: Denies decrease in range of motion, difficulty with gait, muscle pain or joint pain and swelling.  Skin:  Denies redness, rashes, lesions or ulcercations.  Neurological: Pt reports numbness in great toes bilaterally. Denies dizziness, difficulty with memory, difficulty with speech or problems with balance and coordination.  Psych: Denies anxiety, depression, SI/HI.  No other specific complaints in a complete review of systems (except as listed in HPI above).  Objective:  PE:   BP 130/78   Pulse 68   Temp 98.9 F (37.2 C) (Oral)   Ht 5\' 3"  (1.6 m)   Wt 245 lb 8 oz (111.4 kg)   SpO2 98%   BMI 43.49 kg/m  Wt Readings from Last 3 Encounters:  11/04/16 245 lb 8 oz (111.4 kg)  09/20/16 256 lb 9.6 oz (116.4 kg)  07/01/16 248 lb (112.5 kg)    General: Appears her stated age, obese in NAD. Skin: Warm, dry and intact.  HEENT: Head: normal shape and size; Ears: Tm's gray and intact, normal light reflex; Throat/Mouth: Teeth present, mucosa pink and moist, no exudate, lesions or ulcerations noted.  Neck: Thyromegaly noted. Cardiovascular: Normal rate and rhythm. S1,S2 noted.  No murmur, rubs or gallops noted. No JVD or BLE edema. No carotid bruits noted. Pulmonary/Chest: Normal effort and positive vesicular breath sounds. No respiratory distress. No wheezes, rales or ronchi noted.  Abdomen: Soft and nontender. Normal bowel sounds. No distention or masses noted.   Musculoskeletal: No difficulty with gait.  Neurological: Alert and oriented. Sensation decreased in left great toe.  Psychiatric: Mood and affect normal. Behavior is normal. Judgment and thought content normal.     BMET    Component Value Date/Time   NA 139 04/29/2016 0944   K 3.9 04/29/2016 0944   CL 102 04/29/2016 0944   CO2 32 04/29/2016 0944   GLUCOSE 119 (H) 04/29/2016 0944   BUN 12 04/29/2016 0944   CREATININE 1.09 04/29/2016 0944   CALCIUM 9.7 04/29/2016 0944   GFRNONAA 60 (L) 01/01/2014 0330   GFRAA 70 (L) 01/01/2014 0330    Lipid Panel     Component Value Date/Time   CHOL 153 04/29/2016 0944   TRIG 71.0 04/29/2016 0944   HDL 54.80 04/29/2016 0944   CHOLHDL 3 04/29/2016 0944   VLDL 14.2 04/29/2016 0944   LDLCALC 84 04/29/2016 0944    CBC    Component Value Date/Time   WBC 9.0 04/29/2016 0944   RBC 3.95 04/29/2016 0944   HGB 10.3 (L) 04/29/2016 0944   HCT 31.7 (L) 04/29/2016 0944   PLT 329.0 04/29/2016 0944   MCV 80.4 04/29/2016 0944   MCH 28.1 01/01/2014 0330   MCHC 32.5 04/29/2016 0944   RDW 15.6 (H) 04/29/2016 0944   LYMPHSABS 1.7 01/14/2014 1224   MONOABS 0.3 01/14/2014 1224   EOSABS 0.1 01/14/2014 1224   BASOSABS 0.0 01/14/2014 1224    Hgb A1C Lab Results  Component Value Date   HGBA1C 6.8 (H) 04/29/2016      Assessment and Plan:   Medicare Annual Wellness Visit:  Diet: She does eat meat. She consumes more veggies than fruits. She occasionally eats fried foods. She has been trying to consume a low fat diet. She is drinking mostly water, coffee. Physical activity: None Depression/mood screen: Negative Hearing: She has difficulty hearing in a large crowd. She does not want a referral to audiology at this time Visual acuity: She reports she has trouble seeing the TV, but she has glasses, she just doesn't use them. She gets yearly eye exams. ADLs: Capable Fall risk: None Home safety: Good Cognitive evaluation: Intact to orientation,  naming, recall and repetition EOL planning: No adv directives (she does not want additional information), full code/ I agree  Preventative Medicine: Flu shot today. Tetanus and pneumovax UTD. Pap smear due 2018-2020. Mammogram and colonoscopy UTD. Encouraged her to consume a balanced diet and exercise regimen. Advised her to see an eye doctor and dentist annually. Will check CBC, CMET, Lipid, A1C, TSH, T4 and HIV today.   Next appointment: 3 months, follow up DM 2   Elayah Klooster, [place], NP

## 2016-11-04 NOTE — Assessment & Plan Note (Signed)
She will continue Haldol She follows with Dr. Adele Schilder

## 2016-11-04 NOTE — Assessment & Plan Note (Signed)
Controlled on Amlodipine, Carvedilol and HCTZ CMET today

## 2016-11-04 NOTE — Patient Instructions (Signed)

## 2016-11-04 NOTE — Assessment & Plan Note (Signed)
Stable on Advair daily Continue Albuterol prn She follows with Dr. Gilberto Better

## 2016-11-04 NOTE — Assessment & Plan Note (Signed)
She follows with Dr. Gilberto Better She will continue to wear CPAP as instructed Encouraged her to continue to work on weight loss

## 2016-11-04 NOTE — Assessment & Plan Note (Signed)
Encouraged her to continue to work on diet and exercise 

## 2016-11-04 NOTE — Assessment & Plan Note (Signed)
Will check TSH and T4 today Will adjust Synthroid if needed based on labs 

## 2016-11-04 NOTE — Assessment & Plan Note (Signed)
CMET and lipid profile today Encouraged her to consume a low fad diet Continue Fish Oil and Lipitor for now, will adjust if needed based on labs.

## 2016-11-05 LAB — HIV ANTIBODY (ROUTINE TESTING W REFLEX): HIV 1&2 Ab, 4th Generation: NONREACTIVE

## 2016-11-06 ENCOUNTER — Other Ambulatory Visit: Payer: Self-pay | Admitting: Internal Medicine

## 2016-11-06 DIAGNOSIS — J302 Other seasonal allergic rhinitis: Secondary | ICD-10-CM

## 2016-11-08 LAB — HM DIABETES EYE EXAM

## 2016-11-09 ENCOUNTER — Ambulatory Visit (HOSPITAL_COMMUNITY): Payer: Self-pay | Admitting: Psychiatry

## 2016-11-09 ENCOUNTER — Encounter (HOSPITAL_COMMUNITY): Payer: Self-pay | Admitting: Psychiatry

## 2016-11-09 DIAGNOSIS — F200 Paranoid schizophrenia: Secondary | ICD-10-CM

## 2016-11-09 DIAGNOSIS — Z79899 Other long term (current) drug therapy: Secondary | ICD-10-CM | POA: Diagnosis not present

## 2016-11-09 DIAGNOSIS — R69 Illness, unspecified: Secondary | ICD-10-CM | POA: Diagnosis not present

## 2016-11-09 MED ORDER — HALOPERIDOL 2 MG PO TABS: 2.0000 mg | ORAL_TABLET | Freq: Every day | ORAL | 0 refills | Status: DC

## 2016-11-09 NOTE — Addendum Note (Signed)
Addended by: Lurlean Nanny on: 11/09/2016 09:53 AM   Modules accepted: Orders

## 2016-11-09 NOTE — Progress Notes (Signed)
Maria Franklin Progress Note  Maria Franklin [id number] 55 y.o.  11/09/2016 10:39 AM  Chief Complaint:  I'm doing better.  I need my medication.          History of Present Illness: Patient came for her follow-up appointment.  She was last seen in January.  She is taking Haldol 2 mg at bedtime and reported no side effects but she sleeping good.  She is very happy because her daughter has baby girl 4 months ago.  She is happy about her grandchildren.  She does may be sitting for her 62-year-old grandson.  Patient had a good Thanksgiving.  She feels proud that after so many years she cooked for the family.  She denies any irritability, mania, psychosis or any hallucination.  Her energy level is good.  Patient denies drinking alcohol or using any illegal substances patient denies any feeling of hopelessness or worthlessness.  She wants to continue her current psychiatric medication.  Her appetite is okay.  Her vital signs are stable.  Patient seen her primary care physician and her hemoglobin A1c is 6.4.  Suicidal Ideation: No Plan Formed: No Patient has means to carry out plan: No  Homicidal Ideation: No Plan Formed: No Patient has means to carry out plan: No  Review of Systems  Constitutional: Negative.   HENT: Negative.   Eyes: Negative.   Respiratory: Negative.   Cardiovascular: Negative.  Negative for palpitations.  Gastrointestinal: Negative.   Genitourinary: Negative.   Musculoskeletal: Negative.   Skin: Negative.   Neurological: Negative.     Neurologic: Headache: No Seizure: No Paresthesias: No  Medical History:  Patient has a hypertension, hyperlipidemia, type 2 diabetes mellitus, hypothyroidism and obesity.    Outpatient Encounter Prescriptions as of 11/09/2016  Medication Sig Dispense Refill  . ADVAIR DISKUS 250-50 MCG/DOSE AEPB INHALE ONE PUFF INTO LUNGS TWICE DAILY 120 each 1  . amLODipine (NORVASC) 10 MG tablet TAKE ONE TABLET BY MOUTH ONCE DAILY  90 tablet 0  . atorvastatin (LIPITOR) 10 MG tablet TAKE ONE TABLET BY MOUTH ONCE DAILY 90 tablet 1  . B Complex-C (B-COMPLEX WITH VITAMIN C) tablet Take 1 tablet by mouth daily. 90 tablet 0  . carvedilol (COREG) 12.5 MG tablet TAKE ONE TABLET BY MOUTH TWICE DAILY WITH A MEAL 180 tablet 3  . cetirizine (ZYRTEC) 10 MG tablet TAKE ONE TABLET BY MOUTH ONCE DAILY 90 tablet 1  . haloperidol (HALDOL) 2 MG tablet Take 1 tablet (2 mg total) by mouth at bedtime. 90 tablet 0  . hydrochlorothiazide (HYDRODIURIL) 25 MG tablet TAKE ONE TABLET BY MOUTH ONCE DAILY 90 tablet 0  . KLOR-CON M20 20 MEQ tablet TAKE ONE TABLET BY MOUTH TWICE DAILY 180 tablet 1  . levothyroxine (SYNTHROID, LEVOTHROID) 50 MCG tablet TAKE ONE TABLET BY MOUTH ONCE DAILY BEFORE BREAKFAST 90 tablet 0  . lisinopril (PRINIVIL,ZESTRIL) 40 MG tablet TAKE ONE TABLET BY MOUTH ONCE DAILY 90 tablet 3  . NASONEX 50 MCG/ACT nasal spray USE TWO SPRAY(S) IN EACH NOSTRIL ONCE DAILY 17 g 11  . Omega-3 Fatty Acids (FISH OIL PO) Take 1 capsule by mouth daily.    Marland Kitchen omeprazole (PRILOSEC) 20 MG capsule TAKE ONE CAPSULE BY MOUTH ONCE DAILY 90 capsule 3  . tetrahydrozoline (VISINE) 0.05 % ophthalmic solution Place 2 drops into both eyes.    . VENTOLIN HFA 108 (90 Base) MCG/ACT inhaler INHALE ONE TO TWO PUFFS BY MOUTH EVERY 6 HOURS AS NEEDED FOR WHEEZING OR SHORTNESS OF BREATH  18 g 2  . [DISCONTINUED] haloperidol (HALDOL) 2 MG tablet TAKE ONE TABLET BY MOUTH AT BEDTIME 30 tablet 1   No facility-administered encounter medications on file as of 11/09/2016.     Past Psychiatric History/Hospitalization(s): Patient has history of paranoia and delusions.  She has one hospitalization at [facility].  She took overdose on her medication.  She was noncompliant with medication and required admission 4 months later.  She had tried Abilify with good response however at that time she could not afford it.  Patient denies any history of physical sexual verbal and  emotional abuse. Anxiety: Yes Bipolar Disorder: No Depression: Yes Mania: No Psychosis: No Schizophrenia: Yes Personality Disorder: No Hospitalization for psychiatric illness: Yes History of Electroconvulsive Shock Therapy: No Prior Suicide Attempts: Yes  Physical Exam: Constitutional:  BP 110/70   Pulse 81   Ht 5\' 3"  (1.6 m)   Wt 247 lb (112 kg)   BMI 43.75 kg/m  she was reluctant to get blood pressure taken.  Recent Results (from the past 2160 hour(s))  Lipid panel     Status: None   Collection Time: 11/04/16  9:26 AM  Result Value Ref Range   Cholesterol 163 0 - 200 mg/dL    Comment: ATP III Classification       Desirable:  < 200 mg/dL               Borderline High:  200 - 239 mg/dL          High:  > = 240 mg/dL   Triglycerides 87.0 0.0 - 149.0 mg/dL    Comment: Normal:  <150 mg/dLBorderline High:  150 - 199 mg/dL   HDL 62.10 >39.00 mg/dL   VLDL 17.4 0.0 - 40.0 mg/dL   LDL Cholesterol 83 0 - 99 mg/dL   Total CHOL/HDL Ratio 3     Comment:                Men          Women1/2 Average Risk     3.4          3.3Average Risk          5.0          4.42X Average Risk          9.6          7.13X Average Risk          15.0          11.0                       NonHDL 100.52     Comment: NOTE:  Non-HDL goal should be 30 mg/dL higher than patient's LDL goal (i.e. LDL goal of < 70 mg/dL, would have non-HDL goal of < 100 mg/dL)  Hemoglobin A1c     Status: None   Collection Time: 11/04/16  9:26 AM  Result Value Ref Range   Hgb A1c MFr Bld 6.4 4.6 - 6.5 %    Comment: Glycemic Control Guidelines for People with Diabetes:Non Diabetic:  <6%Goal of Therapy: <7%Additional Action Suggested:  >8%   Comprehensive metabolic panel     Status: Abnormal   Collection Time: 11/04/16  9:26 AM  Result Value Ref Range   Sodium 142 135 - 145 mEq/L   Potassium 3.9 3.5 - 5.1 mEq/L   Chloride 103 96 - 112 mEq/L   CO2 32 19 - 32 mEq/L   Glucose,  Bld 115 (H) 70 - 99 mg/dL   BUN 16 6 - 23 mg/dL    Creatinine, Ser 1.19 0.40 - 1.20 mg/dL   Total Bilirubin 0.3 0.2 - 1.2 mg/dL   Alkaline Phosphatase 97 39 - 117 U/L   AST 19 0 - 37 U/L   ALT 18 0 - 35 U/L   Total Protein 7.7 6.0 - 8.3 g/dL   Albumin 4.2 3.5 - 5.2 g/dL   Calcium 9.8 8.4 - 10.5 mg/dL   GFR 60.47 >60.00 mL/min  T4, free     Status: None   Collection Time: 11/04/16  9:26 AM  Result Value Ref Range   Free T4 0.75 0.60 - 1.60 ng/dL    Comment: Specimens from patients who are undergoing biotin therapy and /or ingesting biotin supplements may contain high levels of biotin.  The higher biotin concentration in these specimens interferes with this Free T4 assay.  Specimens that contain high levels  of biotin may cause false high results for this Free T4 assay.  Please interpret results in light of the total clinical presentation of the patient.    TSH     Status: None   Collection Time: 11/04/16  9:26 AM  Result Value Ref Range   TSH 2.05 0.35 - 4.50 uIU/mL  HIV antibody     Status: None   Collection Time: 11/04/16  9:26 AM  Result Value Ref Range   HIV 1&2 Ab, 4th Generation NONREACTIVE NONREACTIVE    Comment:   HIV-1 antigen and HIV-1/HIV-2 antibodies were not detected.  There is no laboratory evidence of HIV infection.   HIV-1/2 Antibody Diff        Not indicated. HIV-1 RNA, Qual TMA          Not indicated.     PLEASE NOTE: This information has been disclosed to you from records whose confidentiality may be protected by state law. If your state requires such protection, then the state law prohibits you from making any further disclosure of the information without the specific written consent of the person to whom it pertains, or as otherwise permitted by law. A general authorization for the release of medical or other information is NOT sufficient for this purpose.   The performance of this assay has not been clinically validated in patients less than 63 years old.   For additional information please refer  to http://education.[employer].com/faq/FAQ106.  (This link is being provided for informational/educational purposes only.)     CBC     Status: Abnormal   Collection Time: 11/04/16  9:26 AM  Result Value Ref Range   WBC 8.4 4.0 - 10.5 K/uL   RBC 4.24 3.87 - 5.11 Mil/uL   Platelets 284.0 150.0 - 400.0 K/uL   Hemoglobin 11.5 (L) 12.0 - 15.0 g/dL   HCT 34.8 (L) 36.0 - 46.0 %   MCV 81.9 78.0 - 100.0 fl   MCHC 33.0 30.0 - 36.0 g/dL   RDW 14.9 11.5 - 15.5 %  Vitamin B12     Status: None   Collection Time: 11/04/16  9:26 AM  Result Value Ref Range   Vitamin B-12 826 211 - 911 pg/mL  Folate     Status: None   Collection Time: 11/04/16  9:26 AM  Result Value Ref Range   Folate >23.4 >5.9 ng/mL  VITAMIN D 25 Hydroxy (Vit-D Deficiency, Fractures)     Status: None   Collection Time: 11/04/16  9:26 AM  Result Value Ref Range   VITD  31.63 30.00 - 100.00 ng/mL   General Appearance: alert, oriented, no acute distress  Musculoskeletal: Strength & Muscle Tone: within normal limits Gait & Station: normal Patient leans: N/A  Mental status examination: Patient is casually dressed and groomed.  She is pleasant, cooperative and maintained good eye contact.  Her speech is clear, fluent and coherent.  Her thought process logical and goal-directed.  She denies any active or passive suicidal thoughts or homicidal thought.  There were no delusions, paranoia or any obsessive thoughts.  Her psychomotor activity is normal.  She has no tremors shakes or any EPS.  Her cognition is good.  She's alert and oriented 3.  Her attention concentration is good.  Her insight judgment and impulse control is okay.  Established Problem, Stable/Improving (1), Review or order clinical lab tests (1), Review of Last Therapy Session (1) and Review of Medication Regimen & Side Effects (2)  Assessment: Axis I: Paranoid schizophrenia, chronic condition ([place]) - Plan: haloperidol (HALDOL) 2 MG tablet   Axis II:  Deferred  Past Medical History:  Diagnosis Date  . Allergy   . Asthma   . Chicken pox   . Hyperlipidemia   . Hypertension   . Thyroid disease      Plan:  Patient is a stable on her current psychiatric medication.  Her hemoglobin A1c stable.  She has no tremors or shakes.  I will continue Haldol 2 mg at bedtime.  Recommended to call us back if she has any question, concern if she feels worsening of the symptom.  Follow-up in 6 months.   Nuri Larmer T., MD 11/09/2016

## 2016-11-25 ENCOUNTER — Other Ambulatory Visit: Payer: Self-pay | Admitting: Internal Medicine

## 2016-12-06 DIAGNOSIS — G4733 Obstructive sleep apnea (adult) (pediatric): Secondary | ICD-10-CM | POA: Diagnosis not present

## 2016-12-14 DIAGNOSIS — G4733 Obstructive sleep apnea (adult) (pediatric): Secondary | ICD-10-CM | POA: Diagnosis not present

## 2016-12-23 ENCOUNTER — Other Ambulatory Visit: Payer: Self-pay | Admitting: Internal Medicine

## 2016-12-24 NOTE — Telephone Encounter (Signed)
Letter mailed to pt letting her know that she is due for repeat labs in Feb

## 2016-12-30 ENCOUNTER — Ambulatory Visit: Payer: Self-pay | Admitting: Pulmonary Disease

## 2017-01-06 DIAGNOSIS — G4733 Obstructive sleep apnea (adult) (pediatric): Secondary | ICD-10-CM | POA: Diagnosis not present

## 2017-01-18 ENCOUNTER — Ambulatory Visit: Payer: Self-pay | Admitting: Pulmonary Disease

## 2017-01-25 ENCOUNTER — Ambulatory Visit: Payer: Self-pay | Admitting: Pulmonary Disease

## 2017-02-03 DIAGNOSIS — G4733 Obstructive sleep apnea (adult) (pediatric): Secondary | ICD-10-CM | POA: Diagnosis not present

## 2017-02-15 ENCOUNTER — Ambulatory Visit: Payer: Self-pay | Admitting: Pulmonary Disease

## 2017-03-02 ENCOUNTER — Other Ambulatory Visit (HOSPITAL_COMMUNITY): Payer: Self-pay | Admitting: Psychiatry

## 2017-03-02 DIAGNOSIS — F200 Paranoid schizophrenia: Secondary | ICD-10-CM

## 2017-03-04 NOTE — Telephone Encounter (Signed)
Patient last seen 11/09/16 with plan to return in 6 months.  Patient is scheduled again for 05/10/17 and was given 90 day Haldol order on 11/09/16.  Dr. Adele Schilder authorized a 90 day refill of patient's Haldol 2 mg, one at bedtime, #90 + 0 refills.  Order e-scribed as approved to patient's [place] per Dr. Adele Schilder order.

## 2017-03-05 ENCOUNTER — Other Ambulatory Visit: Payer: Self-pay | Admitting: Internal Medicine

## 2017-03-06 DIAGNOSIS — G4733 Obstructive sleep apnea (adult) (pediatric): Secondary | ICD-10-CM | POA: Diagnosis not present

## 2017-03-10 ENCOUNTER — Encounter: Payer: Self-pay | Admitting: Internal Medicine

## 2017-03-10 ENCOUNTER — Ambulatory Visit (INDEPENDENT_AMBULATORY_CARE_PROVIDER_SITE_OTHER): Payer: Medicare HMO | Admitting: Internal Medicine

## 2017-03-10 VITALS — BP 128/68 | HR 81 | Temp 97.9°F | Wt 261.2 lb

## 2017-03-10 DIAGNOSIS — J4521 Mild intermittent asthma with (acute) exacerbation: Secondary | ICD-10-CM

## 2017-03-10 MED ORDER — PREDNISONE 10 MG PO TABS: ORAL_TABLET | ORAL | 0 refills | Status: DC

## 2017-03-10 NOTE — Progress Notes (Signed)
Subjective:    Patient ID: Maria Franklin, female    DOB: 10/05/1961, 56 y.o.   MRN: [id number]  HPI  Pt presents to the clinic today with c/o a cough and mild shortness of breath. She reports this started 3 days ago. She does feel better today. The cough is non productive. She denies runny nose, nasal congestion or sore throat. She has used her Altuberol inhaler with some relief. She takes Zyrtec daily for allergies. She has not had sick contacts. She thinks smoke from the grill may have made her asthma flare up.  Review of Systems      Past Medical History:  Diagnosis Date  . Allergy   . Asthma   . Chicken pox   . Hyperlipidemia   . Hypertension   . Thyroid disease     Current Outpatient Prescriptions  Medication Sig Dispense Refill  . ADVAIR DISKUS 250-50 MCG/DOSE AEPB INHALE ONE PUFF INTO LUNGS TWICE DAILY 120 each 1  . amLODipine (NORVASC) 10 MG tablet TAKE ONE TABLET BY MOUTH ONCE DAILY 90 tablet 0  . atorvastatin (LIPITOR) 10 MG tablet TAKE ONE TABLET BY MOUTH ONCE DAILY 90 tablet 0  . B Complex-C (B-COMPLEX WITH VITAMIN C) tablet Take 1 tablet by mouth daily. 90 tablet 0  . carvedilol (COREG) 12.5 MG tablet TAKE ONE TABLET BY MOUTH TWICE DAILY WITH A MEAL 180 tablet 3  . cetirizine (ZYRTEC) 10 MG tablet TAKE ONE TABLET BY MOUTH ONCE DAILY 90 tablet 1  . haloperidol (HALDOL) 2 MG tablet TAKE ONE TABLET BY MOUTH ONCE DAILY AT BEDTIME 90 tablet 0  . hydrochlorothiazide (HYDRODIURIL) 25 MG tablet TAKE ONE TABLET BY MOUTH ONCE DAILY 90 tablet 0  . KLOR-CON M20 20 MEQ tablet TAKE ONE TABLET BY MOUTH TWICE DAILY 180 tablet 0  . levothyroxine (SYNTHROID, LEVOTHROID) 50 MCG tablet TAKE ONE TABLET BY MOUTH ONCE DAILY BEFORE BREAKFAST 90 tablet 0  . lisinopril (PRINIVIL,ZESTRIL) 40 MG tablet TAKE ONE TABLET BY MOUTH ONCE DAILY 90 tablet 3  . NASONEX 50 MCG/ACT nasal spray USE TWO SPRAY(S) IN EACH NOSTRIL ONCE DAILY 17 g 11  . Omega-3 Fatty Acids (FISH OIL PO) Take 1 capsule by  mouth daily.    Marland Kitchen omeprazole (PRILOSEC) 20 MG capsule TAKE ONE CAPSULE BY MOUTH ONCE DAILY 90 capsule 3  . tetrahydrozoline (VISINE) 0.05 % ophthalmic solution Place 2 drops into both eyes.    . VENTOLIN HFA 108 (90 Base) MCG/ACT inhaler INHALE ONE TO TWO PUFFS BY MOUTH EVERY 6 HOURS AS NEEDED FOR WHEEZING OR SHORTNESS OF BREATH 18 g 2   No current facility-administered medications for this visit.     Allergies  Allergen Reactions  . Pollen Extract Cough    Family History  Problem Relation Age of Onset  . Mental illness Mother   . Hypertension Mother   . Schizophrenia Mother   . Depression Mother   . Cancer Father     LUNG  . Cancer Sister     BREAST  . Mental illness Brother   . Hypertension Brother   . Schizophrenia Brother   . Depression Brother   . Heart disease Maternal Grandmother   . Depression Maternal Grandmother   . Schizophrenia Maternal Grandmother   . Schizophrenia Brother   . Schizophrenia Maternal Uncle     Social History   Social History  . Marital status: Married    Spouse name: N/A  . Number of children: N/A  . Years of education:  N/A   Occupational History  . Not on file.   Social History Main Topics  . Smoking status: Former Smoker    Packs/day: 0.25    Years: 6.00  . Smokeless tobacco: Never Used     Comment: QUIT IN HER EARLY 20'S  . Alcohol use No  . Drug use: No  . Sexual activity: Not Currently   Other Topics Concern  . Not on file   Social History Narrative   [address] [place]     Constitutional: Denies fever, malaise, fatigue, headache or abrupt weight changes.  HEENT: Denies eye pain, eye redness, ear pain, ringing in the ears, wax buildup, runny nose, nasal congestion, bloody nose, or sore throat. Respiratory: Pt reports cough and shortness of breath. Denies difficulty breathing, or sputum production.   Cardiovascular: Denies chest pain, chest tightness, palpitations or  swelling in the hands or feet.   No other specific complaints in a complete review of systems (except as listed in HPI above).  Objective:   Physical Exam   BP 128/68   Pulse 81   Temp 97.9 F (36.6 C) (Oral)   Wt 261 lb 4 oz (118.5 kg)   SpO2 96%   BMI 46.28 kg/m  Wt Readings from Last 3 Encounters:  03/10/17 261 lb 4 oz (118.5 kg)  11/04/16 245 lb 8 oz (111.4 kg)  09/20/16 256 lb 9.6 oz (116.4 kg)    General: Appears her stated age, obese in NAD. HEENT: Throat/Mouth: Teeth present, mucosa pink and moist, no exudate, lesions or ulcerations noted.  Cardiovascular: Normal rate and rhythm.  Pulmonary/Chest: Normal effort and bilateral expiratory wheezing noted. No respiratory distress. No rales or ronchi noted.   BMET    Component Value Date/Time   NA 142 11/04/2016 0926   K 3.9 11/04/2016 0926   CL 103 11/04/2016 0926   CO2 32 11/04/2016 0926   GLUCOSE 115 (H) 11/04/2016 0926   BUN 16 11/04/2016 0926   CREATININE 1.19 11/04/2016 0926   CALCIUM 9.8 11/04/2016 0926   GFRNONAA 60 (L) 01/01/2014 0330   GFRAA 70 (L) 01/01/2014 0330    Lipid Panel     Component Value Date/Time   CHOL 163 11/04/2016 0926   TRIG 87.0 11/04/2016 0926   HDL 62.10 11/04/2016 0926   CHOLHDL 3 11/04/2016 0926   VLDL 17.4 11/04/2016 0926   LDLCALC 83 11/04/2016 0926    CBC    Component Value Date/Time   WBC 8.4 11/04/2016 0926   RBC 4.24 11/04/2016 0926   HGB 11.5 (L) 11/04/2016 0926   HCT 34.8 (L) 11/04/2016 0926   PLT 284.0 11/04/2016 0926   MCV 81.9 11/04/2016 0926   MCH 28.1 01/01/2014 0330   MCHC 33.0 11/04/2016 0926   RDW 14.9 11/04/2016 0926   LYMPHSABS 1.7 01/14/2014 1224   MONOABS 0.3 01/14/2014 1224   EOSABS 0.1 01/14/2014 1224   BASOSABS 0.0 01/14/2014 1224    Hgb A1C Lab Results  Component Value Date   HGBA1C 6.4 11/04/2016           Assessment & Plan:   Asthma Exacerbation:  Advised her to continue Albuterol prn, she does not need a refill of this  today eRx for Pred Taper x 6 days  Return precautions discussed Webb Silversmith, NP

## 2017-03-10 NOTE — Progress Notes (Signed)
Pre visit review using our clinic review tool, if applicable. No additional management support is needed unless otherwise documented below in the visit note. 

## 2017-03-10 NOTE — Patient Instructions (Signed)

## 2017-03-14 ENCOUNTER — Other Ambulatory Visit: Payer: Self-pay | Admitting: Internal Medicine

## 2017-04-05 DIAGNOSIS — G4733 Obstructive sleep apnea (adult) (pediatric): Secondary | ICD-10-CM | POA: Diagnosis not present

## 2017-04-08 DIAGNOSIS — G4733 Obstructive sleep apnea (adult) (pediatric): Secondary | ICD-10-CM | POA: Diagnosis not present

## 2017-04-19 ENCOUNTER — Other Ambulatory Visit: Payer: Self-pay | Admitting: Internal Medicine

## 2017-05-03 ENCOUNTER — Emergency Department (HOSPITAL_COMMUNITY)
Admission: EM | Admit: 2017-05-03 | Discharge: 2017-05-03 | Disposition: A | Discharge status: Home / Self Care | Payer: Medicare HMO | Attending: Emergency Medicine | Admitting: Emergency Medicine

## 2017-05-03 ENCOUNTER — Telehealth: Payer: Self-pay | Admitting: Internal Medicine

## 2017-05-03 ENCOUNTER — Emergency Department (HOSPITAL_COMMUNITY): Payer: Medicare HMO

## 2017-05-03 DIAGNOSIS — J45909 Unspecified asthma, uncomplicated: Secondary | ICD-10-CM | POA: Diagnosis not present

## 2017-05-03 DIAGNOSIS — Z79899 Other long term (current) drug therapy: Secondary | ICD-10-CM | POA: Diagnosis not present

## 2017-05-03 DIAGNOSIS — E039 Hypothyroidism, unspecified: Secondary | ICD-10-CM | POA: Diagnosis not present

## 2017-05-03 DIAGNOSIS — M25571 Pain in right ankle and joints of right foot: Secondary | ICD-10-CM

## 2017-05-03 DIAGNOSIS — Z87891 Personal history of nicotine dependence: Secondary | ICD-10-CM | POA: Diagnosis not present

## 2017-05-03 DIAGNOSIS — R0789 Other chest pain: Secondary | ICD-10-CM | POA: Insufficient documentation

## 2017-05-03 DIAGNOSIS — I10 Essential (primary) hypertension: Secondary | ICD-10-CM | POA: Diagnosis not present

## 2017-05-03 DIAGNOSIS — R079 Chest pain, unspecified: Secondary | ICD-10-CM | POA: Diagnosis not present

## 2017-05-03 DIAGNOSIS — M7989 Other specified soft tissue disorders: Secondary | ICD-10-CM | POA: Diagnosis not present

## 2017-05-03 LAB — BASIC METABOLIC PANEL
ANION GAP: 9 (ref 5–15)
BUN: 14 mg/dL (ref 6–20)
CHLORIDE: 103 mmol/L (ref 101–111)
CO2: 28 mmol/L (ref 22–32)
Calcium: 9.2 mg/dL (ref 8.9–10.3)
Creatinine, Ser: 1.21 mg/dL — ABNORMAL HIGH (ref 0.44–1.00)
GFR calc Af Amer: 57 mL/min — ABNORMAL LOW (ref >60)
GFR, EST NON AFRICAN AMERICAN: 49 mL/min — AB (ref >60)
GLUCOSE: 130 mg/dL — AB (ref 65–99)
POTASSIUM: 3.3 mmol/L — AB (ref 3.5–5.1)
Sodium: 140 mmol/L (ref 135–145)

## 2017-05-03 LAB — CBC
HEMATOCRIT: 33.7 % — AB (ref 36.0–46.0)
HEMOGLOBIN: 10.8 g/dL — AB (ref 12.0–15.0)
MCH: 27.3 pg (ref 26.0–34.0)
MCHC: 32 g/dL (ref 30.0–36.0)
MCV: 85.3 fL (ref 78.0–100.0)
Platelets: 249 10*3/uL (ref 150–400)
RBC: 3.95 MIL/uL (ref 3.87–5.11)
RDW: 13.8 % (ref 11.5–15.5)
WBC: 9.8 10*3/uL (ref 4.0–10.5)

## 2017-05-03 LAB — POCT I-STAT TROPONIN I: Troponin i, poc: 0 ng/mL (ref 0.00–0.08)

## 2017-05-03 MED ORDER — SODIUM CHLORIDE 0.9 % IV BOLUS (SEPSIS)
500.0000 mL | Freq: Once | INTRAVENOUS | Status: AC
  Administered 2017-05-03: 500 mL via INTRAVENOUS

## 2017-05-03 MED ORDER — POTASSIUM CHLORIDE CRYS ER 20 MEQ PO TBCR
40.0000 meq | EXTENDED_RELEASE_TABLET | Freq: Once | ORAL | Status: AC
  Administered 2017-05-03: 40 meq via ORAL
  Filled 2017-05-03: qty 2

## 2017-05-03 NOTE — ED Notes (Signed)
Warm compress for ankle given for comfort and relief.

## 2017-05-03 NOTE — ED Notes (Signed)
Pt ambulatory and independent at discharge.  Verbalized understanding of discharge instructions 

## 2017-05-03 NOTE — Telephone Encounter (Signed)
Left detailed msg on VM per HIPAA  

## 2017-05-03 NOTE — Telephone Encounter (Signed)
Patient Name: Maria Franklin  DOB: May 22, 1961    Initial Comment Caller states she has been having sharp pains in her leg and pains in her chest.   Nurse Assessment  Nurse: Leilani Merl, RN, Heather Date/Time (Eastern Time): 05/03/2017 10:41:49 AM  Confirm and document reason for call. If symptomatic, describe symptoms. ---Caller states she has been having sharp pains in her leg and pains in her chest. The leg pain started on Sunday and the chest pain last night  Does the patient have any new or worsening symptoms? ---Yes  Will a triage be completed? ---Yes  Related visit to physician within the last 2 weeks? ---No  Does the PT have any chronic conditions? (i.e. diabetes, asthma, etc.) ---Yes  List chronic conditions. ---See MR  Is this a behavioral health or substance abuse call? ---No     Guidelines    Guideline Title Affirmed Question Affirmed Notes  Chest Pain Patient sounds very sick or weak to the triager    Final Disposition User   Go to ED Now (or PCP triage) Standifer, RN, Stevens states that she will not be able to be seen at the ED until tonight, she said that she would go but it has to be later.   Referrals  [name] - [facility]   Disagree/Comply: Disagree  Disagree/Comply Reason: Disagree with instructions

## 2017-05-03 NOTE — ED Provider Notes (Signed)
[facility] DEPT Provider Note   CSN: [id number] Arrival date & time: 05/03/17  1914     History   Chief Complaint Chief Complaint  Patient presents with  . Chest Pain  . Ankle Pain    HPI Maria Franklin is a 56 y.o. female.  HPI  Patient with a past medical history of hypertension and hyperlipidemia, presents with right-sided ankle swelling that began approximately 3 days ago. She reports intermittent sharp shooting pain behind the ankle that occurs suddenly and without any precipitants. Also reports intermittent chest pain associated with the ankle pain that began yesterday. Chest pain only occurs when the ankle pain occurs. Denies any shortness of breath, hemoptysis, prior MI. Patient denies any injury or trauma to the ankle area that could've brought on the pain. She denies any history of DVT or PE in the past. Patient denies recent surgery, OCP use, history of cancer, blurry vision, recent injury or illness.  Past Medical History:  Diagnosis Date  . Allergy   . Asthma   . Chicken pox   . Hyperlipidemia   . Hypertension   . Thyroid disease     Patient Active Problem List   Diagnosis Date Noted  . OSA (obstructive sleep apnea) 04/29/2016  . GERD (gastroesophageal reflux disease) 10/28/2015  . Asthma, mild persistent 04/21/2015  . Paranoid schizophrenia, chronic condition ([place]) 04/19/2014  . Seasonal allergies 04/19/2014  . DM2 (diabetes mellitus, type 2) ([place]) 04/19/2014  . Angina, [place] 12/30/2013  . Hypertension 01/10/2013  . Hyperlipidemia 01/10/2013  . Hypothyroidism 01/10/2013  . Obesity 01/10/2013    Past Surgical History:  Procedure Laterality Date  . SINUS IRRIGATION    . TONSILLECTOMY      OB History    No data available       Home Medications    Prior to Admission medications   Medication Sig Start Date End Date Taking? Authorizing Provider  ADVAIR DISKUS 250-50 MCG/DOSE AEPB INHALE ONE PUFF INTO LUNGS TWICE DAILY 11/08/16  Yes Baity,  Coralie Keens, NP  amLODipine (NORVASC) 10 MG tablet TAKE ONE TABLET BY MOUTH ONCE DAILY 03/07/17  Yes Jearld Fenton, NP  atorvastatin (LIPITOR) 10 MG tablet TAKE ONE TABLET BY MOUTH ONCE DAILY 03/14/17  Yes Baity, Coralie Keens, NP  B Complex-C (B-COMPLEX WITH VITAMIN C) tablet Take 1 tablet by mouth daily. 01/14/14  Yes Kyra Leyland, PA-C  carvedilol (COREG) 12.5 MG tablet TAKE ONE TABLET BY MOUTH TWICE DAILY WITH A MEAL 05/05/16  Yes Jearld Fenton, NP  cetirizine (ZYRTEC) 10 MG tablet TAKE ONE TABLET BY MOUTH ONCE DAILY 01/22/15  Yes Jearld Fenton, NP  haloperidol (HALDOL) 2 MG tablet TAKE ONE TABLET BY MOUTH ONCE DAILY AT BEDTIME 03/04/17  Yes Arfeen, Arlyce Harman, MD  hydrochlorothiazide (HYDRODIURIL) 25 MG tablet TAKE ONE TABLET BY MOUTH ONCE DAILY 03/14/17  Yes Baity, Coralie Keens, NP  levothyroxine (SYNTHROID, LEVOTHROID) 50 MCG tablet TAKE ONE TABLET BY MOUTH ONCE DAILY BEFORE  BREAKFAST 03/14/17  Yes Jearld Fenton, NP  lisinopril (PRINIVIL,ZESTRIL) 40 MG tablet TAKE ONE TABLET BY MOUTH ONCE DAILY 05/05/16  Yes Jearld Fenton, NP  Multiple Vitamin (MULTIVITAMIN WITH MINERALS) TABS tablet Take 1 tablet by mouth daily.   Yes [provider]  Omega-3 Fatty Acids (FISH OIL PO) Take 1 capsule by mouth daily.   Yes [provider]  omeprazole (PRILOSEC) 20 MG capsule TAKE ONE CAPSULE BY MOUTH ONCE DAILY 05/05/16  Yes Baity, Coralie Keens, NP  potassium  chloride SA (KLOR-CON M20) 20 MEQ tablet Take 1 tablet (20 mEq total) by mouth 2 (two) times daily. PLEASE SCHEDULE FOLLOW UP OFFICE VISIT 04/19/17  Yes Jearld Fenton, NP  VENTOLIN HFA 108 (90 Base) MCG/ACT inhaler INHALE ONE TO TWO PUFFS BY MOUTH EVERY 6 HOURS AS NEEDED FOR WHEEZING OR SHORTNESS OF BREATH 11/01/16  Yes Baity, Coralie Keens, NP  NASONEX 50 MCG/ACT nasal spray USE TWO SPRAY(S) IN EACH NOSTRIL ONCE DAILY Patient not taking: Reported on 05/03/2017 05/05/16   Jearld Fenton, NP  predniSONE (DELTASONE) 10 MG tablet Take 6 tabs day 1, 5 tabs day 2, 4  tabs day 3, 3 tabs day 4, 2 tabs day 5, 1 tab day 6 Patient not taking: Reported on 05/03/2017 03/10/17   Jearld Fenton, NP    Family History Family History  Problem Relation Age of Onset  . Mental illness Mother   . Hypertension Mother   . Schizophrenia Mother   . Depression Mother   . Cancer Father        LUNG  . Cancer Sister        BREAST  . Mental illness Brother   . Hypertension Brother   . Schizophrenia Brother   . Depression Brother   . Heart disease Maternal Grandmother   . Depression Maternal Grandmother   . Schizophrenia Maternal Grandmother   . Schizophrenia Brother   . Schizophrenia Maternal Uncle     Social History Social History  Substance Use Topics  . Smoking status: Former Smoker    Packs/day: 0.25    Years: 6.00  . Smokeless tobacco: Never Used     Comment: QUIT IN HER EARLY 20'S  . Alcohol use No     Allergies   Pollen extract   Review of Systems Review of Systems  Constitutional: Negative for appetite change, chills and fever.  HENT: Negative for ear pain, rhinorrhea, sneezing and sore throat.   Eyes: Negative for photophobia and visual disturbance.  Respiratory: Negative for cough, chest tightness, shortness of breath and wheezing.   Cardiovascular: Positive for chest pain. Negative for palpitations.  Gastrointestinal: Negative for abdominal pain, blood in stool, constipation, diarrhea, nausea and vomiting.  Genitourinary: Negative for dysuria, hematuria and urgency.  Musculoskeletal: Positive for arthralgias. Negative for myalgias.  Skin: Negative for rash.  Neurological: Positive for headaches. Negative for dizziness, weakness and light-headedness.     Physical Exam Updated Vital Signs BP (!) 140/91 (BP Location: Right Arm)   Pulse 83   Temp 98.4 F (36.9 C) (Oral)   Resp 17   Ht 5\' 4"  (1.626 m)   Wt 117.9 kg (260 lb)   LMP 02/02/2017 (Approximate)   SpO2 100%   BMI 44.63 kg/m   Physical Exam  Constitutional: She appears  well-developed and well-nourished. No distress.  HENT:  Head: Normocephalic and atraumatic.  Nose: Nose normal.  Eyes: Conjunctivae and EOM are normal. Left eye exhibits no discharge. No scleral icterus.  Neck: Normal range of motion. Neck supple.  Cardiovascular: Normal rate, regular rhythm, normal heart sounds and intact distal pulses.  Exam reveals no gallop and no friction rub.   No murmur heard. Pulmonary/Chest: Effort normal and breath sounds normal. No respiratory distress.  Abdominal: Soft. Bowel sounds are normal. She exhibits no distension. There is no tenderness. There is no guarding.  Musculoskeletal: Normal range of motion. She exhibits edema (The right ankle).  Patient denies any current tenderness to palpation of the right ankle. No erythema, temperature changes.  2+ pulses bilaterally. Normal sensation and 5/5 strength noted. No calf tenderness.  Neurological: She is alert. She exhibits normal muscle tone. Coordination normal.  Skin: Skin is warm and dry. No rash noted.  Psychiatric: She has a normal mood and affect.  Nursing note and vitals reviewed.    ED Treatments / Results  Labs (all labs ordered are listed, but only abnormal results are displayed) Labs Reviewed  BASIC METABOLIC PANEL - Abnormal; Notable for the following:       Result Value   Potassium 3.3 (*)    Glucose, Bld 130 (*)    Creatinine, Ser 1.21 (*)    GFR calc non Af Amer 49 (*)    GFR calc Af Amer 57 (*)    All other components within normal limits  CBC - Abnormal; Notable for the following:    Hemoglobin 10.8 (*)    HCT 33.7 (*)    All other components within normal limits  I-STAT TROPOININ, ED  POCT I-STAT TROPONIN I    EKG  EKG Interpretation  Date/Time:  Tuesday May 03 2017 19:20:59 EDT Ventricular Rate:  80 PR Interval:    QRS Duration: 85 QT Interval:  356 QTC Calculation: 411 R Axis:   -20 Text Interpretation:  Sinus rhythm Borderline left axis deviation Low voltage,  precordial leads Consider anterior infarct Nonspecific T abnormalities, inferior leads Baseline wander in lead(s) I II since last tracing no significant change Confirmed by Malvin Johns [phone]) on 05/03/2017 7:24:52 PM       Radiology Dg Chest 2 View  Result Date: 05/03/2017 CLINICAL DATA:  56 year old female with chest pain. EXAM: CHEST  2 VIEW COMPARISON:  Chest radiograph dated 01/01/2014 FINDINGS: The lungs are clear. There is no pleural effusion or pneumothorax. The cardiac silhouette is within normal limits. There is slight widening of the AC joints bilaterally, right greater left with mild elevation of the right clavicle in relation to the acromion which may represent a degree of AC separation. Clinical correlation is recommended. No acute osseous pathology. IMPRESSION: 1. No acute cardiopulmonary process. 2. Slight widening of the AC joints with mild elevation of the right clavicle in relation to the acromion. Electronically Signed   By: Anner Crete M.D.   On: 05/03/2017 19:51   Dg Ankle Complete Right  Result Date: 05/03/2017 CLINICAL DATA:  56 y/o F; pain and swelling of the right ankle with 3 days laterally. EXAM: RIGHT ANKLE - COMPLETE 3+ VIEW COMPARISON:  None. FINDINGS: There is no evidence of fracture, dislocation, or joint effusion. There is no evidence of arthropathy or other focal bone abnormality. Plantar calcaneal enthesophyte. Talar dome is intact. Ankle mortise is symmetric. IMPRESSION: No acute fracture or dislocation identified. Electronically Signed   By: Kristine Garbe M.D.   On: 05/03/2017 21:09    Procedures Procedures (including critical care time)  Medications Ordered in ED Medications  sodium chloride 0.9 % bolus 500 mL (0 mLs Intravenous Stopped 05/03/17 2221)  potassium chloride SA (K-DUR,KLOR-CON) CR tablet 40 mEq (40 mEq Oral Given 05/03/17 2150)     Initial Impression / Assessment and Plan / ED Course  I have reviewed the triage vital signs  and the nursing notes.  Pertinent labs & imaging results that were available during my care of the patient were reviewed by me and considered in my medical decision making (see chart for details).     Patient presents with right ankle pain and swelling. She denies any injury or trauma to the  area. No concern for DVT at this time due to presentation and Well's criteria. X-rays of the right ankle were negative for fracture dislocation. No visible deformity, temperature color change noted. Good pulses and strength bilaterally. Area appears neurovascularly intact. Patient also complains of chest pain. She states that the chest pain only occurs when she has the sharp shooting pain in her ankle. No changes with exertion and denies any shortness of breath. Chest x-ray negative for acute cardiopulmonary process. Troponin negative 1. EKG showed no acute changes from previous tracings. CBC unremarkable today. BMP showed potassium 3.3 and this was repleted orally. There is no need for further imaging of the ankle or chest at this time. Her chest pain does not appear to be cardiac as she only experiences during her ankle pain. Due to her reassuring lab work and imaging today I advised patient to follow up with her PCP for further evaluation of this possible neuropathic pain she is experiencing. I advised her to rest, ice and continue anti-inflammatories as needed for the edema. Strict return precautions given.  Final Clinical Impressions(s) / ED Diagnoses   Final diagnoses:  Chest wall pain  Right ankle pain, unspecified chronicity    New Prescriptions New Prescriptions   No medications on file     Delia Heady, PA-C 05/03/17 2235    Malvin Johns, MD 05/04/17 [phone]

## 2017-05-03 NOTE — ED Notes (Signed)
Pt c/o intermittent chest pain since yesterday, right sided ankle swelling with pain since Sunday.  Denies dyspnea, dizziness, n/v/d. Ankle pain worse when laying down.

## 2017-05-03 NOTE — Discharge Instructions (Signed)
Take ibuprofen or Tylenol as needed for ankle pain. Continue rest, ice, elevation of extremity. Follow-up with PCP for further evaluation and management. Return to ED for worsening chest pain, trouble breathing, trouble walking, injury.

## 2017-05-03 NOTE — Telephone Encounter (Signed)
Call pt this afternoon and make sure she plans to go to ER

## 2017-05-06 ENCOUNTER — Other Ambulatory Visit: Payer: Self-pay | Admitting: Internal Medicine

## 2017-05-06 DIAGNOSIS — G4733 Obstructive sleep apnea (adult) (pediatric): Secondary | ICD-10-CM | POA: Diagnosis not present

## 2017-05-10 ENCOUNTER — Ambulatory Visit (HOSPITAL_COMMUNITY): Payer: Self-pay | Admitting: Psychiatry

## 2017-05-12 ENCOUNTER — Ambulatory Visit (HOSPITAL_COMMUNITY): Payer: Self-pay | Admitting: Psychiatry

## 2017-05-12 ENCOUNTER — Ambulatory Visit: Payer: Self-pay | Admitting: Internal Medicine

## 2017-05-19 ENCOUNTER — Encounter: Payer: Self-pay | Admitting: Internal Medicine

## 2017-05-19 ENCOUNTER — Ambulatory Visit (INDEPENDENT_AMBULATORY_CARE_PROVIDER_SITE_OTHER): Payer: Medicare HMO | Admitting: Psychiatry

## 2017-05-19 ENCOUNTER — Encounter (HOSPITAL_COMMUNITY): Payer: Self-pay | Admitting: Psychiatry

## 2017-05-19 ENCOUNTER — Ambulatory Visit (INDEPENDENT_AMBULATORY_CARE_PROVIDER_SITE_OTHER): Payer: Medicare HMO | Admitting: Internal Medicine

## 2017-05-19 VITALS — BP 136/84 | HR 84 | Temp 98.8°F | Wt 262.2 lb

## 2017-05-19 DIAGNOSIS — Z818 Family history of other mental and behavioral disorders: Secondary | ICD-10-CM | POA: Diagnosis not present

## 2017-05-19 DIAGNOSIS — E039 Hypothyroidism, unspecified: Secondary | ICD-10-CM

## 2017-05-19 DIAGNOSIS — Z7951 Long term (current) use of inhaled steroids: Secondary | ICD-10-CM

## 2017-05-19 DIAGNOSIS — E119 Type 2 diabetes mellitus without complications: Secondary | ICD-10-CM

## 2017-05-19 DIAGNOSIS — K122 Cellulitis and abscess of mouth: Secondary | ICD-10-CM

## 2017-05-19 DIAGNOSIS — Z87891 Personal history of nicotine dependence: Secondary | ICD-10-CM | POA: Diagnosis not present

## 2017-05-19 DIAGNOSIS — J4530 Mild persistent asthma, uncomplicated: Secondary | ICD-10-CM

## 2017-05-19 DIAGNOSIS — R69 Illness, unspecified: Secondary | ICD-10-CM | POA: Diagnosis not present

## 2017-05-19 DIAGNOSIS — Z79899 Other long term (current) drug therapy: Secondary | ICD-10-CM | POA: Diagnosis not present

## 2017-05-19 DIAGNOSIS — J301 Allergic rhinitis due to pollen: Secondary | ICD-10-CM | POA: Diagnosis not present

## 2017-05-19 DIAGNOSIS — K219 Gastro-esophageal reflux disease without esophagitis: Secondary | ICD-10-CM

## 2017-05-19 DIAGNOSIS — F200 Paranoid schizophrenia: Secondary | ICD-10-CM | POA: Diagnosis not present

## 2017-05-19 DIAGNOSIS — I10 Essential (primary) hypertension: Secondary | ICD-10-CM

## 2017-05-19 DIAGNOSIS — E7800 Pure hypercholesterolemia, unspecified: Secondary | ICD-10-CM

## 2017-05-19 DIAGNOSIS — G4733 Obstructive sleep apnea (adult) (pediatric): Secondary | ICD-10-CM

## 2017-05-19 LAB — COMPREHENSIVE METABOLIC PANEL
ALT: 17 U/L (ref 0–35)
AST: 19 U/L (ref 0–37)
Albumin: 4.3 g/dL (ref 3.5–5.2)
Alkaline Phosphatase: 105 U/L (ref 39–117)
BUN: 14 mg/dL (ref 6–23)
CO2: 33 mEq/L — ABNORMAL HIGH (ref 19–32)
Calcium: 9.7 mg/dL (ref 8.4–10.5)
Chloride: 100 mEq/L (ref 96–112)
Creatinine, Ser: 1.11 mg/dL (ref 0.40–1.20)
GFR: 65.4 mL/min (ref >60.00)
Glucose, Bld: 95 mg/dL (ref 70–99)
Potassium: 3.6 mEq/L (ref 3.5–5.1)
Sodium: 139 mEq/L (ref 135–145)
Total Bilirubin: 0.3 mg/dL (ref 0.2–1.2)
Total Protein: 7.4 g/dL (ref 6.0–8.3)

## 2017-05-19 LAB — LIPID PANEL
Cholesterol: 160 mg/dL (ref 0–200)
HDL: 57.6 mg/dL (ref >39.00)
LDL Cholesterol: 82 mg/dL (ref 0–99)
NonHDL: 102.34
Total CHOL/HDL Ratio: 3
Triglycerides: 102 mg/dL (ref 0.0–149.0)
VLDL: 20.4 mg/dL (ref 0.0–40.0)

## 2017-05-19 LAB — CBC
HEMATOCRIT: 34.2 % — AB (ref 36.0–46.0)
HEMOGLOBIN: 11.1 g/dL — AB (ref 12.0–15.0)
MCHC: 32.6 g/dL (ref 30.0–36.0)
MCV: 83.9 fl (ref 78.0–100.0)
Platelets: 282 10*3/uL (ref 150.0–400.0)
RBC: 4.08 Mil/uL (ref 3.87–5.11)
RDW: 14.2 % (ref 11.5–15.5)
WBC: 9.7 10*3/uL (ref 4.0–10.5)

## 2017-05-19 LAB — T4, FREE: FREE T4: 0.8 ng/dL (ref 0.60–1.60)

## 2017-05-19 LAB — TSH: TSH: 2.39 u[IU]/mL (ref 0.35–4.50)

## 2017-05-19 LAB — HEMOGLOBIN A1C: Hgb A1c MFr Bld: 6.9 % — ABNORMAL HIGH (ref 4.6–6.5)

## 2017-05-19 MED ORDER — BUDESONIDE-FORMOTEROL FUMARATE 80-4.5 MCG/ACT IN AERO: 2.0000 | INHALATION_SPRAY | Freq: Two times a day (BID) | RESPIRATORY_TRACT | 11 refills | Status: DC

## 2017-05-19 MED ORDER — HALOPERIDOL 2 MG PO TABS: 2.0000 mg | ORAL_TABLET | Freq: Every day | ORAL | 1 refills | Status: DC

## 2017-05-19 MED ORDER — OMEPRAZOLE 40 MG PO CPDR: 40.0000 mg | DELAYED_RELEASE_CAPSULE | Freq: Every day | ORAL | 11 refills | Status: DC

## 2017-05-19 NOTE — Progress Notes (Signed)
Subjective:    Patient ID: Maria Franklin, female    DOB: 01-08-1961, 56 y.o.   MRN: [id number]  HPI  Pt presents to the clinic today for 6 month follow up of chronic conditions.  Seasonal Allergies: Worse in the spring. S/p polypectomy x 2 by ENT.  She takes Zyrtec and Flonase daily with good relief.  HTN: Her BP today is 136/84. She is taking Amlodipine, Lisinopril and Carvedilol. ECG from 04/2017 reviewed.  Asthma, Mild Persistent: She does reports some increased shortness of breath. She had to stop her Advair because her insurance would no longer pay for it. She takes Albuterol more frequently now. She follows with Dr. Gilberto Better.  [place] Angina: Recent ER visit for the same, ER note reviewed. She does not follow with cardiology.  DM 2: Her last A1C was 6.4%, 10/2016. She does not test her sugars. She is not taking any diabetic medication at this time. 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 She checks her feet daily. Her last eye exam was 11/2016.  HLD: Her last LDL was 83, 10/2016. She is taking Lipitor and Fish Oil as prescribed. She denies myalgias. She does not consume a low fat diet.  Hypothyroidism: Her levels were last checked 10/2016. She denies any issues on her current dose of Synthroid.  Paranoid Schizophrenia: Chronic but stable on Haldol. She follows with Dr. Adele Schilder and reports she just saw him earlier today.  GERD: Triggered by eating and then laying down. She is taking Prilosec daily but does not feel like it is effective as it used to be. She has frequent breakthrough symptoms.  OSA: She averages 6  hours of sleep with the use of her CPAP. She feels rested when she wakes up.  Review of Systems      Past Medical History:  Diagnosis Date  . Allergy   . Asthma   . Chicken pox   . Hyperlipidemia   . Hypertension   . Thyroid disease     Current Outpatient Prescriptions  Medication Sig Dispense Refill  . ADVAIR DISKUS 250-50 MCG/DOSE AEPB INHALE ONE PUFF  INTO LUNGS TWICE DAILY 120 each 1  . amLODipine (NORVASC) 10 MG tablet TAKE ONE TABLET BY MOUTH ONCE DAILY 90 tablet 0  . atorvastatin (LIPITOR) 10 MG tablet TAKE ONE TABLET BY MOUTH ONCE DAILY 90 tablet 0  . B Complex-C (B-COMPLEX WITH VITAMIN C) tablet Take 1 tablet by mouth daily. 90 tablet 0  . carvedilol (COREG) 12.5 MG tablet TAKE ONE TABLET BY MOUTH TWICE DAILY WITH MEALS 180 tablet 0  . cetirizine (ZYRTEC) 10 MG tablet TAKE ONE TABLET BY MOUTH ONCE DAILY 90 tablet 1  . haloperidol (HALDOL) 2 MG tablet TAKE ONE TABLET BY MOUTH ONCE DAILY AT BEDTIME 90 tablet 0  . hydrochlorothiazide (HYDRODIURIL) 25 MG tablet TAKE ONE TABLET BY MOUTH ONCE DAILY 90 tablet 0  . levothyroxine (SYNTHROID, LEVOTHROID) 50 MCG tablet TAKE ONE TABLET BY MOUTH ONCE DAILY BEFORE  BREAKFAST 90 tablet 0  . lisinopril (PRINIVIL,ZESTRIL) 40 MG tablet TAKE ONE TABLET BY MOUTH ONCE DAILY 90 tablet 3  . Multiple Vitamin (MULTIVITAMIN WITH MINERALS) TABS tablet Take 1 tablet by mouth daily.    Marland Kitchen NASONEX 50 MCG/ACT nasal spray USE TWO SPRAY(S) IN EACH NOSTRIL ONCE DAILY (Patient not taking: Reported on 05/03/2017) 17 g 11  . Omega-3 Fatty Acids (FISH OIL PO) Take 1 capsule by mouth daily.    Marland Kitchen omeprazole (PRILOSEC) 20 MG capsule TAKE ONE  CAPSULE BY MOUTH ONCE DAILY 90 capsule 3  . potassium chloride SA (KLOR-CON M20) 20 MEQ tablet Take 1 tablet (20 mEq total) by mouth 2 (two) times daily. PLEASE SCHEDULE FOLLOW UP OFFICE VISIT 180 tablet 0  . predniSONE (DELTASONE) 10 MG tablet Take 6 tabs day 1, 5 tabs day 2, 4 tabs day 3, 3 tabs day 4, 2 tabs day 5, 1 tab day 6 (Patient not taking: Reported on 05/03/2017) 21 tablet 0  . VENTOLIN HFA 108 (90 Base) MCG/ACT inhaler INHALE ONE TO TWO PUFFS BY MOUTH EVERY 6 HOURS AS NEEDED FOR WHEEZING OR SHORTNESS OF BREATH 18 g 2   No current facility-administered medications for this visit.     Allergies  Allergen Reactions  . Pollen Extract Cough    Family History  Problem Relation Age  of Onset  . Mental illness Mother   . Hypertension Mother   . Schizophrenia Mother   . Depression Mother   . Cancer Father        LUNG  . Cancer Sister        BREAST  . Mental illness Brother   . Hypertension Brother   . Schizophrenia Brother   . Depression Brother   . Heart disease Maternal Grandmother   . Depression Maternal Grandmother   . Schizophrenia Maternal Grandmother   . Schizophrenia Brother   . Schizophrenia Maternal Uncle     Social History   Social History  . Marital status: Married    Spouse name: N/A  . Number of children: N/A  . Years of education: N/A   Occupational History  . Not on file.   Social History Main Topics  . Smoking status: Former Smoker    Packs/day: 0.25    Years: 6.00  . Smokeless tobacco: Never Used     Comment: QUIT IN HER EARLY 20'S  . Alcohol use No  . Drug use: No  . Sexual activity: Not Currently   Other Topics Concern  . Not on file   Social History Narrative   3 [place]     Constitutional: Pt reports weight gain. Denies fever, malaise, fatigue, headache.  HEENT: Denies eye pain, eye redness, ear pain, ringing in the ears, wax buildup, runny nose, nasal congestion, bloody nose, or sore throat. Respiratory: Denies difficulty breathing, shortness of breath, cough or sputum production.   Cardiovascular: Denies chest pain, chest tightness, palpitations or swelling in the hands or feet.  Gastrointestinal: Pt reports reflux. Denies abdominal pain, bloating, constipation, diarrhea or blood in the stool.  GU: Denies urgency, frequency, pain with urination, burning sensation, blood in urine, odor or discharge. Musculoskeletal: Denies decrease in range of motion, difficulty with gait, muscle pain or joint pain and swelling.  Skin: Denies redness, rashes, lesions or ulcercations.  Neurological: Pt reports mild numbness in feet. Denies dizziness, difficulty with memory,  difficulty with speech or problems with balance and coordination.  Psych: Pt has history of anxiety and depression. Denies SI/HI.  No other specific complaints in a complete review of systems (except as listed in HPI above).  Objective:   Physical Exam  BP 136/84   Pulse 84   Temp 98.8 F (37.1 C) (Oral)   Wt 262 lb 4 oz (119 kg)   SpO2 97%   BMI 45.02 kg/m  Wt Readings from Last 3 Encounters:  05/19/17 262 lb 4 oz (119 kg)  05/03/17 260 lb (117.9 kg)  03/10/17 261  lb 4 oz (118.5 kg)    General: Appears her stated age, obese in NAD. Skin: Warm, dry and intact. No ulcerations noted. HEENT:  Nose: mucosa pink and moist; Throat/Mouth: Teeth present, mucosa pink and moist, no exudate, lesions or ulcerations noted.  Neck:  Neck supple, trachea midline. No masses, lumps present.  Cardiovascular: Normal rate and rhythm. S1,S2 noted.  No murmur, rubs or gallops noted. No JVD or BLE edema. No carotid bruits noted. Pulmonary/Chest: Normal effort and positive vesicular breath sounds. No respiratory distress. No wheezes, rales or ronchi noted.  Abdomen: Soft and nontender. Normal bowel sounds. No distention or masses noted.  Neurological: Alert and oriented. Sensation intact to BLE Psychiatric: Mood and affect normal. Behavior is normal. Judgment and thought content normal.    BMET    Component Value Date/Time   NA 140 05/03/2017 1931   K 3.3 (L) 05/03/2017 1931   CL 103 05/03/2017 1931   CO2 28 05/03/2017 1931   GLUCOSE 130 (H) 05/03/2017 1931   BUN 14 05/03/2017 1931   CREATININE 1.21 (H) 05/03/2017 1931   CALCIUM 9.2 05/03/2017 1931   GFRNONAA 49 (L) 05/03/2017 1931   GFRAA 57 (L) 05/03/2017 1931    Lipid Panel     Component Value Date/Time   CHOL 163 11/04/2016 0926   TRIG 87.0 11/04/2016 0926   HDL 62.10 11/04/2016 0926   CHOLHDL 3 11/04/2016 0926   VLDL 17.4 11/04/2016 0926   LDLCALC 83 11/04/2016 0926    CBC    Component Value Date/Time   WBC 9.8 05/03/2017  1931   RBC 3.95 05/03/2017 1931   HGB 10.8 (L) 05/03/2017 1931   HCT 33.7 (L) 05/03/2017 1931   PLT 249 05/03/2017 1931   MCV 85.3 05/03/2017 1931   MCH 27.3 05/03/2017 1931   MCHC 32.0 05/03/2017 1931   RDW 13.8 05/03/2017 1931   LYMPHSABS 1.7 01/14/2014 1224   MONOABS 0.3 01/14/2014 1224   EOSABS 0.1 01/14/2014 1224   BASOSABS 0.0 01/14/2014 1224    Hgb A1C Lab Results  Component Value Date   HGBA1C 6.4 11/04/2016            Assessment & Plan:

## 2017-05-19 NOTE — Assessment & Plan Note (Signed)
Deteriorated off Advair Will see if insurance will cover Symbicort, eRx sent to pharmacy Continue Albuterol prn You may be able to get samples from pulmonology

## 2017-05-19 NOTE — Assessment & Plan Note (Signed)
She will continue Zyrtec and Flonase Will monitor

## 2017-05-19 NOTE — Assessment & Plan Note (Signed)
She denies chest pain at this time Will monitor

## 2017-05-19 NOTE — Assessment & Plan Note (Signed)
Deteriorated Encouraged weight loss Increase Prilosec to 40 mg daily, eRx sent to pharmacy

## 2017-05-19 NOTE — Assessment & Plan Note (Signed)
TSH and Free T4 today Will adjust Synthroid if needed based on labs 

## 2017-05-19 NOTE — Patient Instructions (Signed)
Food Choices for Gastroesophageal Reflux Disease, Adult When you have gastroesophageal reflux disease (GERD), the foods you eat and your eating habits are very important. Choosing the right foods can help ease your discomfort. What guidelines do I need to follow?  Choose fruits, vegetables, whole grains, and low-fat dairy products.  Choose low-fat meat, fish, and poultry.  Limit fats such as oils, salad dressings, butter, nuts, and avocado.  Keep a food diary. This helps you identify foods that cause symptoms.  Avoid foods that cause symptoms. These may be different for everyone.  Eat small meals often instead of 3 large meals a day.  Eat your meals slowly, in a place where you are relaxed.  Limit fried foods.  Cook foods using methods other than frying.  Avoid drinking alcohol.  Avoid drinking large amounts of liquids with your meals.  Avoid bending over or lying down until 2-3 hours after eating. What foods are not recommended? These are some foods and drinks that may make your symptoms worse: Vegetables  Tomatoes. Tomato juice. Tomato and spaghetti sauce. Chili peppers. Onion and garlic. Horseradish. Fruits  Oranges, grapefruit, and lemon (fruit and juice). Meats  High-fat meats, fish, and poultry. This includes hot dogs, ribs, ham, sausage, salami, and bacon. Dairy  Whole milk and chocolate milk. Sour cream. Cream. Butter. Ice cream. Cream cheese. Drinks  Coffee and tea. Bubbly (carbonated) drinks or energy drinks. Condiments  Hot sauce. Barbecue sauce. Sweets/Desserts  Chocolate and cocoa. Donuts. Peppermint and spearmint. Fats and Oils  High-fat foods. This includes French fries and potato chips. Other  Vinegar. Strong spices. This includes black pepper, white pepper, red pepper, cayenne, curry powder, cloves, ginger, and chili powder. The items listed above may not be a complete list of foods and drinks to avoid. Contact your dietitian for more information.    This information is not intended to replace advice given to you by your health care provider. Make sure you discuss any questions you have with your health care provider. Document Released: 05/23/2012 Document Revised: 04/29/2016 Document Reviewed: 09/26/2013 Elsevier Interactive Patient Education  2017 [employer]  

## 2017-05-19 NOTE — Assessment & Plan Note (Signed)
Stable on Haldol She will continue to follow with Dr. Adele Schilder

## 2017-05-19 NOTE — Assessment & Plan Note (Addendum)
A1C today Microalbumin not needed secondary to ACEI therapy Encouraged her to consume a low carb, low fat diet and exercise for weight loss Foot exam today Encourage yearly eye exams

## 2017-05-19 NOTE — Assessment & Plan Note (Signed)
Encouraged weight loss She will continue to wear her CPAP She will continue to follow with Dr. Gilberto Better

## 2017-05-19 NOTE — Assessment & Plan Note (Signed)
Mildly elevated today Continue Amlodipine, Lisinopril and Carvedilol CMET today

## 2017-05-19 NOTE — Assessment & Plan Note (Signed)
CMET and Lipid profile today Encouraged her to consume a low fat diet Continue Fish Oil and Lipitor for now

## 2017-05-19 NOTE — Progress Notes (Signed)
[place] MD/PA/NP OP Progress Note  05/19/2017 8:50 AM Maria Franklin  MRN:  [id number]  Chief Complaint:  Subjective:  Medication management and follow-up.  HPI: Patient came for her follow-up appointment.  She is taking her medication as prescribed.  She is taking Haldol 2 mg at bedtime which is helping her paranoia, irritability, sleep.  She does enjoy the company of grandchildren.  Patient denies any irritability, anger, paranoia or any crying spells.  Her energy level is good.  Patient denies drinking alcohol or using any illegal substances.  She denies any feeling of hopelessness or worthlessness.  Recently she was seen in the emergency room because of leg pain and chest pain and her cardiology workup was normal.  Patient denies drinking alcohol or using any illegal substances.  She wants to continue Haldol 2 mg at bedtime.  Visit Diagnosis:    ICD-10-CM   1. Paranoid schizophrenia, chronic condition ([place]) F20.0 haloperidol (HALDOL) 2 MG tablet    Past Psychiatric History: Reviewed. Patient has history of paranoia and delusions.  She has one hospitalization at [facility].  She took overdose on her medication.  She was noncompliant with medication and required admission 4 months later.  She had tried Abilify with good response however at that time she could not afford it.  Patient denies any history of physical sexual verbal and emotional abuse.  Past Medical History:  Past Medical History:  Diagnosis Date  . Allergy   . Asthma   . Chicken pox   . Hyperlipidemia   . Hypertension   . Thyroid disease     Past Surgical History:  Procedure Laterality Date  . SINUS IRRIGATION    . TONSILLECTOMY      Family Psychiatric History: Reviewed.  Family History:  Family History  Problem Relation Age of Onset  . Mental illness Mother   . Hypertension Mother   . Schizophrenia Mother   . Depression Mother   . Cancer Father        LUNG  . Cancer Sister        BREAST  . Mental  illness Brother   . Hypertension Brother   . Schizophrenia Brother   . Depression Brother   . Heart disease Maternal Grandmother   . Depression Maternal Grandmother   . Schizophrenia Maternal Grandmother   . Schizophrenia Brother   . Schizophrenia Maternal Uncle     Social History:  Social History   Social History  . Marital status: Married    Spouse name: N/A  . Number of children: N/A  . Years of education: N/A   Social History Main Topics  . Smoking status: Former Smoker    Packs/day: 0.25    Years: 6.00  . Smokeless tobacco: Never Used     Comment: QUIT IN HER EARLY 20'S  . Alcohol use No  . Drug use: No  . Sexual activity: Not Currently   Other Topics Concern  . None   Social History Narrative   3 CHILDREN   RECENTLY MOVED TO [place]   SEEKING EMPLOYMENT    Allergies:  Allergies  Allergen Reactions  . Pollen Extract Cough    Metabolic Disorder Labs: Lab Results  Component Value Date   HGBA1C 6.4 11/04/2016   No results found for: PROLACTIN Lab Results  Component Value Date   CHOL 163 11/04/2016   TRIG 87.0 11/04/2016   HDL 62.10 11/04/2016   CHOLHDL 3 11/04/2016   VLDL 17.4 11/04/2016   LDLCALC 83 11/04/2016  [place] 84 04/29/2016     Current Medications: Current Outpatient Prescriptions  Medication Sig Dispense Refill  . ADVAIR DISKUS 250-50 MCG/DOSE AEPB INHALE ONE PUFF INTO LUNGS TWICE DAILY 120 each 1  . amLODipine (NORVASC) 10 MG tablet TAKE ONE TABLET BY MOUTH ONCE DAILY 90 tablet 0  . atorvastatin (LIPITOR) 10 MG tablet TAKE ONE TABLET BY MOUTH ONCE DAILY 90 tablet 0  . B Complex-C (B-COMPLEX WITH VITAMIN C) tablet Take 1 tablet by mouth daily. 90 tablet 0  . carvedilol (COREG) 12.5 MG tablet TAKE ONE TABLET BY MOUTH TWICE DAILY WITH MEALS 180 tablet 0  . cetirizine (ZYRTEC) 10 MG tablet TAKE ONE TABLET BY MOUTH ONCE DAILY 90 tablet 1  . haloperidol (HALDOL) 2 MG tablet TAKE ONE TABLET BY MOUTH ONCE DAILY AT BEDTIME 90 tablet 0   . hydrochlorothiazide (HYDRODIURIL) 25 MG tablet TAKE ONE TABLET BY MOUTH ONCE DAILY 90 tablet 0  . levothyroxine (SYNTHROID, LEVOTHROID) 50 MCG tablet TAKE ONE TABLET BY MOUTH ONCE DAILY BEFORE  BREAKFAST 90 tablet 0  . lisinopril (PRINIVIL,ZESTRIL) 40 MG tablet TAKE ONE TABLET BY MOUTH ONCE DAILY 90 tablet 3  . Multiple Vitamin (MULTIVITAMIN WITH MINERALS) TABS tablet Take 1 tablet by mouth daily.    . Omega-3 Fatty Acids (FISH OIL PO) Take 1 capsule by mouth daily.    Marland Kitchen omeprazole (PRILOSEC) 20 MG capsule TAKE ONE CAPSULE BY MOUTH ONCE DAILY 90 capsule 3  . potassium chloride SA (KLOR-CON M20) 20 MEQ tablet Take 1 tablet (20 mEq total) by mouth 2 (two) times daily. PLEASE SCHEDULE FOLLOW UP OFFICE VISIT 180 tablet 0  . VENTOLIN HFA 108 (90 Base) MCG/ACT inhaler INHALE ONE TO TWO PUFFS BY MOUTH EVERY 6 HOURS AS NEEDED FOR WHEEZING OR SHORTNESS OF BREATH 18 g 2  . NASONEX 50 MCG/ACT nasal spray USE TWO SPRAY(S) IN EACH NOSTRIL ONCE DAILY (Patient not taking: Reported on 05/03/2017) 17 g 11  . predniSONE (DELTASONE) 10 MG tablet Take 6 tabs day 1, 5 tabs day 2, 4 tabs day 3, 3 tabs day 4, 2 tabs day 5, 1 tab day 6 (Patient not taking: Reported on 05/03/2017) 21 tablet 0   No current facility-administered medications for this visit.     Neurologic: Headache: No Seizure: No Paresthesias: No  Musculoskeletal: Strength & Muscle Tone: within normal limits Gait & Station: normal Patient leans: N/A  Psychiatric Specialty Exam: ROS  Blood pressure 128/76, pulse 92, height 5\' 4"  (1.626 m), weight 261 lb 9.6 oz (118.7 kg).Body mass index is 44.9 kg/m.  General Appearance: Casual  Eye Contact:  Good  Speech:  Clear and Coherent  Volume:  Normal  Mood:  Euthymic  Affect:  Congruent  Thought Process:  Goal Directed  Orientation:  Full (Time, Place, and Person)  Thought Content: WDL and Logical   Suicidal Thoughts:  No  Homicidal Thoughts:  No  Memory:  Immediate;   Good Recent;    Good Remote;   Good  Judgement:  Good  Insight:  Good  Psychomotor Activity:  Normal  Concentration:  Concentration: Good and Attention Span: Good  Recall:  Good  Fund of Knowledge: Good  Language: Good  Akathisia:  No  Handed:  Right  AIMS (if indicated):  0  Assets:  Communication Skills Desire for Improvement Housing Resilience Social Support  ADL's:  Intact  Cognition: WNL  Sleep:  faair    Assessment: Chronic schizophrenia paranoid type.  Plan: Patient is a stable on her current psychiatric  medication.  I will continue Haldol 2 mg at bedtime.  She has no tremors shakes or any EPS.  Discussed medication side effects and benefits.  Recommended to call us back if she has any question, concern or if she feels worsening of the symptom.  Follow-up in 6 months.  Dody Smartt T., MD 05/19/2017, 8:50 AM

## 2017-05-24 ENCOUNTER — Ambulatory Visit (INDEPENDENT_AMBULATORY_CARE_PROVIDER_SITE_OTHER): Payer: Medicare HMO | Admitting: Primary Care

## 2017-05-24 ENCOUNTER — Encounter: Payer: Self-pay | Admitting: Primary Care

## 2017-05-24 VITALS — BP 120/78 | HR 76 | Temp 98.6°F | Wt 257.1 lb

## 2017-05-24 DIAGNOSIS — J209 Acute bronchitis, unspecified: Secondary | ICD-10-CM | POA: Diagnosis not present

## 2017-05-24 MED ORDER — DOXYCYCLINE HYCLATE 100 MG PO TABS: 100.0000 mg | ORAL_TABLET | Freq: Two times a day (BID) | ORAL | 0 refills | Status: DC

## 2017-05-24 NOTE — Patient Instructions (Signed)
Start Doxycycline antibiotic. Take 1 tablet by mouth twice daily for 10 days.  Cough/Congestion: Try taking Mucinex DM. This will help loosen up the mucous in your chest. Ensure you take this medication with a full glass of water.  Try applying for free Symbicort on the website.  It was a pleasure meeting you!

## 2017-05-24 NOTE — Progress Notes (Signed)
Subjective:    Patient ID: Maria Franklin, female    DOB: 11-07-1961, 56 y.o.   MRN: [id number]  HPI  Maria Franklin is a 56 year old female with a history of asthma, GERD, type 2 diabetes, seasonal allergies who presents today with a chief complaint of cough.   Maria Franklin also reports fevers, nasal congestion, sinus pressure, shortness of breath. Her symptoms began about 10 days ago. Maria Franklin's been around her family who has had the same symptoms. Her grandson was diagnosed with pneumonia and an ear infection recently. Her temperatures are running in the 101-102 range, temporary improvement with Motrin. Maria Franklin's taken Motrin OTC for her symptoms.   Maria Franklin was recently prescribed Symbicort for asthma as Adviar was not covered by her insurance. Maria Franklin found that the Symbicort was too expensive so Maria Franklin did not pick up this medication from the pharmacy.  Review of Systems  Constitutional: Positive for chills, fatigue and fever.  HENT: Positive for congestion and sinus pressure.   Respiratory: Positive for cough, shortness of breath and wheezing.   Cardiovascular: Negative for chest pain and palpitations.       Past Medical History:  Diagnosis Date  . Allergy   . Asthma   . Chicken pox   . Hyperlipidemia   . Hypertension   . Thyroid disease      Social History   Social History  . Marital status: Married    Spouse name: N/A  . Number of children: N/A  . Years of education: N/A   Occupational History  . Not on file.   Social History Main Topics  . Smoking status: Former Smoker    Packs/day: 0.25    Years: 6.00  . Smokeless tobacco: Never Used     Comment: QUIT IN HER EARLY 20'S  . Alcohol use No  . Drug use: No  . Sexual activity: Not Currently   Other Topics Concern  . Not on file   Social History Narrative   3 CHILDREN   RECENTLY MOVED TO [place]   SEEKING EMPLOYMENT    Past Surgical History:  Procedure Laterality Date  . SINUS IRRIGATION    . TONSILLECTOMY      Family  History  Problem Relation Age of Onset  . Mental illness Mother   . Hypertension Mother   . Schizophrenia Mother   . Depression Mother   . Cancer Father        LUNG  . Cancer Sister        BREAST  . Mental illness Brother   . Hypertension Brother   . Schizophrenia Brother   . Depression Brother   . Heart disease Maternal Grandmother   . Depression Maternal Grandmother   . Schizophrenia Maternal Grandmother   . Schizophrenia Brother   . Schizophrenia Maternal Uncle     Allergies  Allergen Reactions  . Pollen Extract Cough    Current Outpatient Prescriptions on File Prior to Visit  Medication Sig Dispense Refill  . amLODipine (NORVASC) 10 MG tablet TAKE ONE TABLET BY MOUTH ONCE DAILY 90 tablet 0  . atorvastatin (LIPITOR) 10 MG tablet TAKE ONE TABLET BY MOUTH ONCE DAILY 90 tablet 0  . B Complex-C (B-COMPLEX WITH VITAMIN C) tablet Take 1 tablet by mouth daily. 90 tablet 0  . carvedilol (COREG) 12.5 MG tablet TAKE ONE TABLET BY MOUTH TWICE DAILY WITH MEALS 180 tablet 0  . cetirizine (ZYRTEC) 10 MG tablet TAKE ONE TABLET BY MOUTH ONCE DAILY 90 tablet 1  .  haloperidol (HALDOL) 2 MG tablet Take 1 tablet (2 mg total) by mouth daily with breakfast. 90 tablet 1  . hydrochlorothiazide (HYDRODIURIL) 25 MG tablet TAKE ONE TABLET BY MOUTH ONCE DAILY 90 tablet 0  . levothyroxine (SYNTHROID, LEVOTHROID) 50 MCG tablet TAKE ONE TABLET BY MOUTH ONCE DAILY BEFORE  BREAKFAST 90 tablet 0  . lisinopril (PRINIVIL,ZESTRIL) 40 MG tablet TAKE ONE TABLET BY MOUTH ONCE DAILY 90 tablet 3  . Multiple Vitamin (MULTIVITAMIN WITH MINERALS) TABS tablet Take 1 tablet by mouth daily.    . Omega-3 Fatty Acids (FISH OIL PO) Take 1 capsule by mouth daily.    Marland Kitchen omeprazole (PRILOSEC) 40 MG capsule Take 1 capsule (40 mg total) by mouth daily. 30 capsule 11  . potassium chloride SA (KLOR-CON M20) 20 MEQ tablet Take 1 tablet (20 mEq total) by mouth 2 (two) times daily. PLEASE SCHEDULE FOLLOW UP OFFICE VISIT 180 tablet 0    . VENTOLIN HFA 108 (90 Base) MCG/ACT inhaler INHALE ONE TO TWO PUFFS BY MOUTH EVERY 6 HOURS AS NEEDED FOR WHEEZING OR SHORTNESS OF BREATH 18 g 2  . budesonide-formoterol (SYMBICORT) 80-4.5 MCG/ACT inhaler Inhale 2 puffs into the lungs 2 (two) times daily. (Patient not taking: Reported on 05/24/2017) 1 Inhaler 11   No current facility-administered medications on file prior to visit.     BP 120/78   Pulse 76   Temp 98.6 F (37 C) (Oral)   Wt 257 lb 1.9 oz (116.6 kg)   SpO2 97%   BMI 44.13 kg/m    Objective:   Physical Exam  Constitutional: Maria Franklin appears well-nourished. Maria Franklin appears ill.  HENT:  Right Ear: Tympanic membrane and ear canal normal.  Left Ear: Tympanic membrane and ear canal normal.  Nose: Right sinus exhibits no maxillary sinus tenderness and no frontal sinus tenderness. Left sinus exhibits no maxillary sinus tenderness and no frontal sinus tenderness.  Mouth/Throat: Oropharynx is clear and moist.  Eyes: Conjunctivae are normal.  Neck: Neck supple.  Cardiovascular: Normal rate and regular rhythm.   Pulmonary/Chest: Effort normal. Maria Franklin has wheezes in the right upper field and the left upper field. Maria Franklin has rhonchi in the right upper field, the right lower field, the left upper field and the left lower field. Maria Franklin has no rales.  Mild wheezing, moderate rhonchi  Lymphadenopathy:    Maria Franklin has no cervical adenopathy.  Skin: Skin is warm and dry.          Assessment & Plan:  Acute Bronchitis:  Cough, congestion, fevers, fatigue x 10 days. Exam today with suspicious for bacterial involvement as noted above. Rx for Doxycycline course sent to pharmacy. Discussed use of Mucinex DM, albuterol inhaler. Maria Franklin is stable for outpatient treatment. Will have her go to the Symbicort website to apply for financial assistance. Follow up PRN.  Sheral Flow, NP

## 2017-06-05 DIAGNOSIS — G4733 Obstructive sleep apnea (adult) (pediatric): Secondary | ICD-10-CM | POA: Diagnosis not present

## 2017-06-15 ENCOUNTER — Other Ambulatory Visit: Payer: Self-pay | Admitting: Internal Medicine

## 2017-06-16 MED ORDER — MOMETASONE FUROATE 50 MCG/ACT NA SUSP: 2.0000 | Freq: Every day | NASAL | 5 refills | Status: DC

## 2017-06-16 NOTE — Addendum Note (Signed)
Addended by: Lurlean Nanny on: 06/16/2017 05:12 PM   Modules accepted: Orders

## 2017-06-17 ENCOUNTER — Other Ambulatory Visit: Payer: Self-pay

## 2017-06-17 DIAGNOSIS — K219 Gastro-esophageal reflux disease without esophagitis: Secondary | ICD-10-CM

## 2017-06-17 MED ORDER — OMEPRAZOLE 40 MG PO CPDR: 40.0000 mg | DELAYED_RELEASE_CAPSULE | Freq: Every day | ORAL | 3 refills | Status: DC

## 2017-06-17 NOTE — Telephone Encounter (Signed)
Pt request 90 day rx for omeprazole to [facility]. I spoke with Christian at [facility] and cancelled # [phone] x 11 refills and sent in 90 day x 3 as requested.. Pt notified done and voiced understanding.

## 2017-06-27 ENCOUNTER — Other Ambulatory Visit: Payer: Self-pay | Admitting: Internal Medicine

## 2017-07-04 ENCOUNTER — Other Ambulatory Visit: Payer: Self-pay | Admitting: Internal Medicine

## 2017-07-04 DIAGNOSIS — Z1231 Encounter for screening mammogram for malignant neoplasm of breast: Secondary | ICD-10-CM

## 2017-07-06 DIAGNOSIS — G4733 Obstructive sleep apnea (adult) (pediatric): Secondary | ICD-10-CM | POA: Diagnosis not present

## 2017-07-28 ENCOUNTER — Other Ambulatory Visit: Payer: Self-pay | Admitting: Internal Medicine

## 2017-08-01 ENCOUNTER — Other Ambulatory Visit: Payer: Self-pay | Admitting: Internal Medicine

## 2017-08-06 DIAGNOSIS — G4733 Obstructive sleep apnea (adult) (pediatric): Secondary | ICD-10-CM | POA: Diagnosis not present

## 2017-08-17 ENCOUNTER — Ambulatory Visit
Admission: RE | Admit: 2017-08-17 | Discharge: 2017-08-17 | Disposition: A | Discharge status: Home / Self Care | Payer: Medicare HMO | Source: Ambulatory Visit | Attending: Internal Medicine | Admitting: Internal Medicine

## 2017-08-17 DIAGNOSIS — Z1231 Encounter for screening mammogram for malignant neoplasm of breast: Secondary | ICD-10-CM | POA: Diagnosis not present

## 2017-08-21 ENCOUNTER — Encounter (HOSPITAL_COMMUNITY): Payer: Self-pay | Admitting: Emergency Medicine

## 2017-08-21 ENCOUNTER — Emergency Department (HOSPITAL_COMMUNITY): Payer: Medicare HMO

## 2017-08-21 ENCOUNTER — Emergency Department (HOSPITAL_COMMUNITY)
Admission: EM | Admit: 2017-08-21 | Discharge: 2017-08-22 | Discharge status: Against Medical Advice | Payer: Medicare HMO | Attending: Emergency Medicine | Admitting: Emergency Medicine

## 2017-08-21 DIAGNOSIS — R0602 Shortness of breath: Secondary | ICD-10-CM | POA: Diagnosis not present

## 2017-08-21 DIAGNOSIS — I10 Essential (primary) hypertension: Secondary | ICD-10-CM | POA: Diagnosis not present

## 2017-08-21 DIAGNOSIS — R079 Chest pain, unspecified: Secondary | ICD-10-CM | POA: Diagnosis present

## 2017-08-21 DIAGNOSIS — Z79899 Other long term (current) drug therapy: Secondary | ICD-10-CM | POA: Diagnosis not present

## 2017-08-21 DIAGNOSIS — J45909 Unspecified asthma, uncomplicated: Secondary | ICD-10-CM | POA: Insufficient documentation

## 2017-08-21 DIAGNOSIS — Z87891 Personal history of nicotine dependence: Secondary | ICD-10-CM | POA: Insufficient documentation

## 2017-08-21 DIAGNOSIS — E039 Hypothyroidism, unspecified: Secondary | ICD-10-CM | POA: Diagnosis not present

## 2017-08-21 LAB — BASIC METABOLIC PANEL
Anion gap: 7 (ref 5–15)
BUN: 17 mg/dL (ref 6–20)
CHLORIDE: 103 mmol/L (ref 101–111)
CO2: 29 mmol/L (ref 22–32)
Calcium: 9 mg/dL (ref 8.9–10.3)
Creatinine, Ser: 1.3 mg/dL — ABNORMAL HIGH (ref 0.44–1.00)
GFR, EST AFRICAN AMERICAN: 52 mL/min — AB (ref >60)
GFR, EST NON AFRICAN AMERICAN: 45 mL/min — AB (ref >60)
GLUCOSE: 110 mg/dL — AB (ref 65–99)
POTASSIUM: 3.4 mmol/L — AB (ref 3.5–5.1)
Sodium: 139 mmol/L (ref 135–145)

## 2017-08-21 LAB — CBC
HEMATOCRIT: 34.8 % — AB (ref 36.0–46.0)
Hemoglobin: 11 g/dL — ABNORMAL LOW (ref 12.0–15.0)
MCH: 27 pg (ref 26.0–34.0)
MCHC: 31.6 g/dL (ref 30.0–36.0)
MCV: 85.3 fL (ref 78.0–100.0)
Platelets: 252 10*3/uL (ref 150–400)
RBC: 4.08 MIL/uL (ref 3.87–5.11)
RDW: 14.8 % (ref 11.5–15.5)
WBC: 8.7 10*3/uL (ref 4.0–10.5)

## 2017-08-21 LAB — POCT I-STAT TROPONIN I
Troponin i, poc: 0.03 ng/mL (ref 0.00–0.08)
Troponin i, poc: 0.03 ng/mL (ref 0.00–0.08)

## 2017-08-21 NOTE — ED Triage Notes (Addendum)
Pt from home with c/o htn and sob x 3 days. Pt reports central cp that began today and is intermittent. Pt denies cp at this time. Pt reports she is compliant with her home meds. Pt has clear lung sounds.

## 2017-08-21 NOTE — ED Provider Notes (Signed)
[place] DEPT Provider Note   CSN: [id number] Arrival date & time: 08/21/17  6644     History   Chief Complaint Chief Complaint  Patient presents with  . Hypertension  . Chest Pain  . Shortness of Breath    HPI Maria Franklin is a 56 y.o. female.  The history is provided by the patient.  Hypertension  This is a chronic problem. The current episode started more than 1 week ago. The problem occurs constantly. The problem has been gradually worsening. Associated symptoms include chest pain. Pertinent negatives include no abdominal pain and no headaches. Nothing aggravates the symptoms. Nothing relieves the symptoms. She has tried nothing for the symptoms. The treatment provided significant relief.  While at rest.  No DOE no exertional symptoms no leg swelling not long car trips or plane trips.    Past Medical History:  Diagnosis Date  . Allergy   . Asthma   . Chicken pox   . Hyperlipidemia   . Hypertension   . Thyroid disease     Patient Active Problem List   Diagnosis Date Noted  . OSA (obstructive sleep apnea) 04/29/2016  . GERD (gastroesophageal reflux disease) 10/28/2015  . Asthma, mild persistent 04/21/2015  . Paranoid schizophrenia, chronic condition ([place]) 04/19/2014  . Seasonal allergies 04/19/2014  . DM2 (diabetes mellitus, type 2) ([place]) 04/19/2014  . Angina, [place] 12/30/2013  . Hypertension 01/10/2013  . Hyperlipidemia 01/10/2013  . Hypothyroidism 01/10/2013    Past Surgical History:  Procedure Laterality Date  . SINUS IRRIGATION    . TONSILLECTOMY      OB History    No data available       Home Medications    Prior to Admission medications   Medication Sig Start Date End Date Taking? Authorizing Provider  amLODipine (NORVASC) 10 MG tablet TAKE 1 TABLET BY MOUTH ONCE DAILY 06/27/17   Jearld Fenton, NP  atorvastatin (LIPITOR) 10 MG tablet TAKE 1 TABLET BY MOUTH ONCE DAILY 06/27/17   Jearld Fenton, NP  B Complex-C (B-COMPLEX WITH  VITAMIN C) tablet Take 1 tablet by mouth daily. 01/14/14   Kyra Leyland, PA-C  budesonide-formoterol [facility]) 80-4.5 MCG/ACT inhaler Inhale 2 puffs into the lungs 2 (two) times daily. Patient not taking: Reported on 05/24/2017 05/19/17   Jearld Fenton, NP  carvedilol (COREG) 12.5 MG tablet TAKE ONE TABLET BY MOUTH TWICE DAILY WITH MEALS 05/06/17   Jearld Fenton, NP  cetirizine (ZYRTEC) 10 MG tablet TAKE ONE TABLET BY MOUTH ONCE DAILY 01/22/15   Jearld Fenton, NP  doxycycline (VIBRA-TABS) 100 MG tablet Take 1 tablet (100 mg total) by mouth 2 (two) times daily. 05/24/17   Pleas Koch, NP  haloperidol (HALDOL) 2 MG tablet Take 1 tablet (2 mg total) by mouth daily with breakfast. 05/19/17   Arfeen, Arlyce Harman, MD  hydrochlorothiazide (HYDRODIURIL) 25 MG tablet TAKE 1 TABLET BY MOUTH ONCE DAILY 08/01/17   Jearld Fenton, NP  levothyroxine (SYNTHROID, LEVOTHROID) 50 MCG tablet TAKE 1 TABLET BY MOUTH ONCE DAILY BEFORE  BREAKFAST 06/27/17   Jearld Fenton, NP  levothyroxine (SYNTHROID, LEVOTHROID) 50 MCG tablet TAKE 1 TABLET BY MOUTH ONCE DAILY BEFORE  BREAKFAST 07/28/17   Jearld Fenton, NP  lisinopril (PRINIVIL,ZESTRIL) 40 MG tablet TAKE ONE TABLET BY MOUTH ONCE DAILY 06/27/17   Jearld Fenton, NP  mometasone (NASONEX) 50 MCG/ACT nasal spray Place 2 sprays into the nose daily. 06/16/17   Jearld Fenton, NP  Multiple  Vitamin (MULTIVITAMIN WITH MINERALS) TABS tablet Take 1 tablet by mouth daily.    [provider]  Omega-3 Fatty Acids (FISH OIL PO) Take 1 capsule by mouth daily.    [provider]  omeprazole (PRILOSEC) 40 MG capsule Take 1 capsule (40 mg total) by mouth daily. 06/17/17   Jearld Fenton, NP  potassium chloride SA (KLOR-CON M20) 20 MEQ tablet Take 1 tablet (20 mEq total) by mouth 2 (two) times daily. 07/28/17   Jearld Fenton, NP  VENTOLIN HFA 108 (90 Base) MCG/ACT inhaler INHALE ONE TO TWO PUFFS BY MOUTH EVERY 6 HOURS AS NEEDED FOR WHEEZING OR SHORTNESS OF BREATH  11/01/16   Jearld Fenton, NP    Family History Family History  Problem Relation Age of Onset  . Mental illness Mother   . Hypertension Mother   . Schizophrenia Mother   . Depression Mother   . Cancer Father        LUNG  . Cancer Sister        BREAST  . Breast cancer Sister   . Mental illness Brother   . Hypertension Brother   . Schizophrenia Brother   . Depression Brother   . Heart disease Maternal Grandmother   . Depression Maternal Grandmother   . Schizophrenia Maternal Grandmother   . Schizophrenia Brother   . Schizophrenia Maternal Uncle   . Breast cancer Maternal Aunt     Social History Social History  Substance Use Topics  . Smoking status: Former Smoker    Packs/day: 0.25    Years: 6.00  . Smokeless tobacco: Never Used     Comment: QUIT IN HER EARLY 20'S  . Alcohol use No     Allergies   Pollen extract   Review of Systems Review of Systems  Constitutional: Negative for diaphoresis.  Respiratory: Negative for chest tightness and wheezing.   Cardiovascular: Positive for chest pain. Negative for palpitations and leg swelling.  Gastrointestinal: Negative for abdominal pain, nausea and vomiting.  Neurological: Negative for headaches.  All other systems reviewed and are negative.    Physical Exam Updated Vital Signs BP (!) 183/104 (BP Location: Left Arm)   Pulse 88   Temp 98.1 F (36.7 C) (Oral)   Resp 16   SpO2 97%   Physical Exam  Constitutional: She is oriented to person, place, and time. She appears well-developed and well-nourished. No distress.  HENT:  Head: Normocephalic and atraumatic.  Mouth/Throat: No oropharyngeal exudate.  Eyes: Pupils are equal, round, and reactive to light. Conjunctivae are normal.  Neck: Normal range of motion. Neck supple. No JVD present.  Cardiovascular: Normal rate, regular rhythm, normal heart sounds and intact distal pulses.   Pulmonary/Chest: Effort normal and breath sounds normal. No stridor. No  respiratory distress. She has no wheezes. She has no rales.  Abdominal: Soft. Bowel sounds are normal. She exhibits no mass. There is no tenderness. There is no rebound and no guarding.  Musculoskeletal: Normal range of motion. She exhibits no edema or tenderness.  Neurological: She is alert and oriented to person, place, and time.  Skin: Skin is warm and dry. Capillary refill takes less than 2 seconds. She is not diaphoretic.  Psychiatric: She has a normal mood and affect.     ED Treatments / Results   Vitals:   08/21/17 1931 08/21/17 2237  BP: (!) 176/113 (!) 183/104  Pulse: 81 88  Resp: 18 16  Temp: 98.1 F (36.7 C)   SpO2: 97% 97%  Labs (all labs ordered are listed, but only abnormal results are displayed)  Results for orders placed or performed during the hospital encounter of 99/24/26  Basic metabolic panel  Result Value Ref Range   Sodium 139 135 - 145 mmol/L   Potassium 3.4 (L) 3.5 - 5.1 mmol/L   Chloride 103 101 - 111 mmol/L   CO2 29 22 - 32 mmol/L   Glucose, Bld 110 (H) 65 - 99 mg/dL   BUN 17 6 - 20 mg/dL   Creatinine, Ser 1.30 (H) 0.44 - 1.00 mg/dL   Calcium 9.0 8.9 - 10.3 mg/dL   GFR calc non Af Amer 45 (L) >60 mL/min   GFR calc Af Amer 52 (L) >60 mL/min   Anion gap 7 5 - 15  CBC  Result Value Ref Range   WBC 8.7 4.0 - 10.5 K/uL   RBC 4.08 3.87 - 5.11 MIL/uL   Hemoglobin 11.0 (L) 12.0 - 15.0 g/dL   HCT 34.8 (L) 36.0 - 46.0 %   MCV 85.3 78.0 - 100.0 fL   MCH 27.0 26.0 - 34.0 pg   MCHC 31.6 30.0 - 36.0 g/dL   RDW 14.8 11.5 - 15.5 %   Platelets 252 150 - 400 K/uL  POCT i-Stat troponin I  Result Value Ref Range   Troponin i, poc 0.03 0.00 - 0.08 ng/mL   Comment 3          POCT i-Stat troponin I  Result Value Ref Range   Troponin i, poc 0.03 0.00 - 0.08 ng/mL   Comment 3           Dg Chest 2 View  Result Date: 08/21/2017 CLINICAL DATA:  Shortness of breath EXAM: CHEST  2 VIEW COMPARISON:  05/03/2017, 01/01/2014 FINDINGS: No acute pulmonary  infiltrate, consolidation, or pleural effusion. Stable cardiomediastinal silhouette. No pneumothorax. 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 IMPRESSION: 1. No acute infiltrate or edema 2. 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 Electronically Signed   By: Donavan Foil M.D.   On: 08/21/2017 20:02   Mm Screening Breast Tomo Bilateral  Result Date: 08/17/2017 CLINICAL DATA:  Screening. EXAM: 2D DIGITAL SCREENING BILATERAL MAMMOGRAM WITH CAD AND ADJUNCT TOMO COMPARISON:  Previous exam(s). ACR Breast Density Category b: There are scattered areas of fibroglandular density. FINDINGS: There are no findings suspicious for malignancy. Images were processed with CAD. IMPRESSION: No mammographic evidence of malignancy. A result letter of this screening mammogram will be mailed directly to the patient. RECOMMENDATION: Screening mammogram in one year. (Code:SM-B-01Y) BI-RADS CATEGORY  1: Negative. Electronically Signed   By: Margarette Canada M.D.   On: 08/17/2017 12:45    EKG  EKG Interpretation  Date/Time:  Sunday August 21 2017 19:34:46 EDT Ventricular Rate:  73 PR Interval:    QRS Duration: 85 QT Interval:  363 QTC Calculation: 400 R Axis:   -30 Text Interpretation:  Sinus rhythm Left axis deviation Low voltage, precordial leads Probable anteroseptal infarct, old since last tracing no significant change Confirmed by Daleen Bo [phone]) on 08/21/2017 8:10:13 PM       Radiology Dg Chest 2 View  Result Date: 08/21/2017 CLINICAL DATA:  Shortness of breath EXAM: CHEST  2 VIEW COMPARISON:  05/03/2017, 01/01/2014 FINDINGS: No acute pulmonary infiltrate, consolidation, or pleural effusion. Stable cardiomediastinal silhouette. No pneumothorax. 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 IMPRESSION: 1. No  acute infiltrate or  edema 2. 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 Electronically Signed   By: Donavan Foil M.D.   On: 08/21/2017 20:02    Procedures Procedures (including critical care time)  Eloped post evaluation   Final Clinical Impressions(s) / ED Diagnoses  Hypertension, eloped after EDP evaluated the patient     Veatrice Kells, MD 08/22/17 1102

## 2017-08-24 ENCOUNTER — Other Ambulatory Visit: Payer: Self-pay | Admitting: Internal Medicine

## 2017-08-29 ENCOUNTER — Encounter: Payer: Self-pay | Admitting: Internal Medicine

## 2017-08-29 ENCOUNTER — Ambulatory Visit (INDEPENDENT_AMBULATORY_CARE_PROVIDER_SITE_OTHER): Payer: Medicare HMO | Admitting: Internal Medicine

## 2017-08-29 VITALS — BP 154/96 | HR 77 | Temp 98.6°F | Wt 261.0 lb

## 2017-08-29 DIAGNOSIS — E876 Hypokalemia: Secondary | ICD-10-CM | POA: Diagnosis not present

## 2017-08-29 DIAGNOSIS — R7989 Other specified abnormal findings of blood chemistry: Secondary | ICD-10-CM | POA: Diagnosis not present

## 2017-08-29 DIAGNOSIS — T502X5A Adverse effect of carbonic-anhydrase inhibitors, benzothiadiazides and other diuretics, initial encounter: Secondary | ICD-10-CM | POA: Diagnosis not present

## 2017-08-29 DIAGNOSIS — I10 Essential (primary) hypertension: Secondary | ICD-10-CM | POA: Diagnosis not present

## 2017-08-29 LAB — BASIC METABOLIC PANEL
BUN: 16 mg/dL (ref 6–23)
CALCIUM: 9.2 mg/dL (ref 8.4–10.5)
CO2: 30 mEq/L (ref 19–32)
CREATININE: 1.09 mg/dL (ref 0.40–1.20)
Chloride: 100 mEq/L (ref 96–112)
GFR: 66.72 mL/min (ref >60.00)
Glucose, Bld: 159 mg/dL — ABNORMAL HIGH (ref 70–99)
Potassium: 3.2 mEq/L — ABNORMAL LOW (ref 3.5–5.1)
Sodium: 138 mEq/L (ref 135–145)

## 2017-08-29 NOTE — Progress Notes (Signed)
Subjective:    Patient ID: Maria Franklin, female    DOB: May 31, 1961, 56 y.o.   MRN: [id number]  HPI  Pt presents to the clinic today with c/o chest pain and elevated blood pressure. She has noticed this over the last 10 days or so. She went to the ER 9/17 for the same. ECG was normal. Troponins were negative. Potassium was 3.4, creatinine was 1.30. Chest xray was normal. She eloped before being formally evaluated. She has a history of HTN and is taking Lisinopril, HCT, Carvedilol and Potassium. She is also prescribed Amlodipine, but she reports she does not think she has taken it in the last month, she is not sure why. She reports she started taking the Amlodipine 3 days ago. She denies chest pain at this time. She also denies headaches, visual changes or dizziness. Her BP today is 154/96. She reports she does not see cardiology.  Review of Systems      Past Medical History:  Diagnosis Date  . Allergy   . Asthma   . Chicken pox   . Hyperlipidemia   . Hypertension   . Thyroid disease     Current Outpatient Prescriptions  Medication Sig Dispense Refill  . amLODipine (NORVASC) 10 MG tablet TAKE 1 TABLET BY MOUTH ONCE DAILY 90 tablet 0  . atorvastatin (LIPITOR) 10 MG tablet TAKE 1 TABLET BY MOUTH ONCE DAILY 90 tablet 0  . B Complex-C (B-COMPLEX WITH VITAMIN C) tablet Take 1 tablet by mouth daily. 90 tablet 0  . budesonide-formoterol (SYMBICORT) 80-4.5 MCG/ACT inhaler Inhale 2 puffs into the lungs 2 (two) times daily. (Patient not taking: Reported on 05/24/2017) 1 Inhaler 11  . carvedilol (COREG) 12.5 MG tablet TAKE 1 TABLET BY MOUTH TWICE DAILY WITH MEALS 180 tablet 0  . cetirizine (ZYRTEC) 10 MG tablet TAKE ONE TABLET BY MOUTH ONCE DAILY 90 tablet 1  . doxycycline (VIBRA-TABS) 100 MG tablet Take 1 tablet (100 mg total) by mouth 2 (two) times daily. 20 tablet 0  . haloperidol (HALDOL) 2 MG tablet Take 1 tablet (2 mg total) by mouth daily with breakfast. 90 tablet 1  .  hydrochlorothiazide (HYDRODIURIL) 25 MG tablet TAKE 1 TABLET BY MOUTH ONCE DAILY 90 tablet 0  . levothyroxine (SYNTHROID, LEVOTHROID) 50 MCG tablet TAKE 1 TABLET BY MOUTH ONCE DAILY BEFORE  BREAKFAST 90 tablet 0  . levothyroxine (SYNTHROID, LEVOTHROID) 50 MCG tablet TAKE 1 TABLET BY MOUTH ONCE DAILY BEFORE  BREAKFAST 90 tablet 0  . lisinopril (PRINIVIL,ZESTRIL) 40 MG tablet TAKE ONE TABLET BY MOUTH ONCE DAILY 90 tablet 0  . mometasone (NASONEX) 50 MCG/ACT nasal spray Place 2 sprays into the nose daily. 17 g 5  . Multiple Vitamin (MULTIVITAMIN WITH MINERALS) TABS tablet Take 1 tablet by mouth daily.    . Omega-3 Fatty Acids (FISH OIL PO) Take 1 capsule by mouth daily.    Marland Kitchen omeprazole (PRILOSEC) 40 MG capsule Take 1 capsule (40 mg total) by mouth daily. 90 capsule 3  . potassium chloride SA (KLOR-CON M20) 20 MEQ tablet Take 1 tablet (20 mEq total) by mouth 2 (two) times daily. 180 tablet 0  . VENTOLIN HFA 108 (90 Base) MCG/ACT inhaler INHALE ONE TO TWO PUFFS BY MOUTH EVERY 6 HOURS AS NEEDED FOR WHEEZING OR SHORTNESS OF BREATH 18 g 2   No current facility-administered medications for this visit.     Allergies  Allergen Reactions  . Pollen Extract Cough    Family History  Problem Relation Age of  Onset  . Mental illness Mother   . Hypertension Mother   . Schizophrenia Mother   . Depression Mother   . Cancer Father        LUNG  . Cancer Sister        BREAST  . Breast cancer Sister   . Mental illness Brother   . Hypertension Brother   . Schizophrenia Brother   . Depression Brother   . Heart disease Maternal Grandmother   . Depression Maternal Grandmother   . Schizophrenia Maternal Grandmother   . Schizophrenia Brother   . Schizophrenia Maternal Uncle   . Breast cancer Maternal Aunt     Social History   Social History  . Marital status: Married    Spouse name: N/A  . Number of children: N/A  . Years of education: N/A   Occupational History  . Not on file.   Social History  Main Topics  . Smoking status: Former Smoker    Packs/day: 0.25    Years: 6.00  . Smokeless tobacco: Never Used     Comment: QUIT IN HER EARLY 20'S  . Alcohol use No  . Drug use: No  . Sexual activity: Not Currently   Other Topics Concern  . Not on file   Social History Narrative   3 [place]     Constitutional: Denies fever, malaise, fatigue, headache or abrupt weight changes.  Respiratory: Denies difficulty breathing, shortness of breath, cough or sputum production.   Cardiovascular: Denies chest pain, chest tightness, palpitations or swelling in the hands or feet.  Gastrointestinal: Denies abdominal pain, bloating, constipation, diarrhea or blood in the stool.  Neurological: Denies dizziness, difficulty with memory, difficulty with speech or problems with balance and coordination.    No other specific complaints in a complete review of systems (except as listed in HPI above).  Objective:   Physical Exam   BP (!) 154/96   Pulse 77   Temp 98.6 F (37 C) (Oral)   Wt 261 lb (118.4 kg)   SpO2 98%   BMI 44.80 kg/m  Wt Readings from Last 3 Encounters:  08/29/17 261 lb (118.4 kg)  05/24/17 257 lb 1.9 oz (116.6 kg)  05/19/17 262 lb 4 oz (119 kg)    General: Appears her stated age, obese in NAD. Cardiovascular: Normal rate and rhythm. S1,S2 noted.  No murmur, rubs or gallops noted. Trace BLE edema.  Pulmonary/Chest: Normal effort and positive vesicular breath sounds. No respiratory distress. No wheezes, rales or ronchi noted.  Neurological: Alert and oriented.    BMET    Component Value Date/Time   NA 139 08/21/2017 1940   K 3.4 (L) 08/21/2017 1940   CL 103 08/21/2017 1940   CO2 29 08/21/2017 1940   GLUCOSE 110 (H) 08/21/2017 1940   BUN 17 08/21/2017 1940   CREATININE 1.30 (H) 08/21/2017 1940   CALCIUM 9.0 08/21/2017 1940   GFRNONAA 45 (L) 08/21/2017 1940   GFRAA 52 (L) 08/21/2017 1940    Lipid Panel       Component Value Date/Time   CHOL 160 05/19/2017 1129   TRIG 102.0 05/19/2017 1129   HDL 57.60 05/19/2017 1129   CHOLHDL 3 05/19/2017 1129   VLDL 20.4 05/19/2017 1129   LDLCALC 82 05/19/2017 1129    CBC    Component Value Date/Time   WBC 8.7 08/21/2017 1940   RBC 4.08 08/21/2017 1940   HGB 11.0 (L) 08/21/2017 1940  HCT 34.8 (L) 08/21/2017 1940   PLT 252 08/21/2017 1940   MCV 85.3 08/21/2017 1940   MCH 27.0 08/21/2017 1940   MCHC 31.6 08/21/2017 1940   RDW 14.8 08/21/2017 1940   LYMPHSABS 1.7 01/14/2014 1224   MONOABS 0.3 01/14/2014 1224   EOSABS 0.1 01/14/2014 1224   BASOSABS 0.0 01/14/2014 1224    Hgb A1C Lab Results  Component Value Date   HGBA1C 6.9 (H) 05/19/2017           Assessment & Plan:   HTN, Chest Pain, Diuretic Induced Hypokalemia:  ER notes, labs and imaging reviewed Repeat BMET May need to stop HCTZ due to elevated serum creatinine and diuretic induced hypokalemia despite potassium supplement. Will follow up after labs with recommendations about BP control May benefit from cardiology referral but will hold off on this for now  RTC in 2 weeks for follow up HTN BAITY, REGINA, NP

## 2017-08-29 NOTE — Patient Instructions (Signed)

## 2017-08-31 MED ORDER — HYDRALAZINE HCL 10 MG PO TABS: 10.0000 mg | ORAL_TABLET | Freq: Two times a day (BID) | ORAL | 2 refills | Status: DC

## 2017-08-31 NOTE — Addendum Note (Signed)
Addended by: Lurlean Nanny on: 08/31/2017 12:32 PM   Modules accepted: Orders

## 2017-09-05 ENCOUNTER — Telehealth (HOSPITAL_COMMUNITY): Payer: Self-pay

## 2017-09-05 DIAGNOSIS — G4733 Obstructive sleep apnea (adult) (pediatric): Secondary | ICD-10-CM | POA: Diagnosis not present

## 2017-09-05 NOTE — Telephone Encounter (Signed)
I received a message from patients pharmacy, the Haldol 2 mg is on back order and they would like to know what they can give the patient instead. Please review and advise, thank you

## 2017-09-06 MED ORDER — HALOPERIDOL 1 MG PO TABS: 2.0000 mg | ORAL_TABLET | Freq: Every day | ORAL | 2 refills | Status: DC

## 2017-09-06 NOTE — Telephone Encounter (Signed)
Called patient to let her know that a new order had been sent to the pharmacy for 1 mg 2 tabs daily. Patient agreeable to this plan

## 2017-09-06 NOTE — Telephone Encounter (Signed)
Okay to refill Haldol 1 mg to take 2 tablet a day.

## 2017-09-06 NOTE — Telephone Encounter (Signed)
I called the patient back and she states that her follow up is not until December and she can not be off of psychiatric medication that long. I called the pharmacy and they can get 1 mg Haldol and she can take 2 a day, please review and advise, thank you

## 2017-09-06 NOTE — Telephone Encounter (Signed)
We will discuss on her next appointment.

## 2017-09-12 ENCOUNTER — Encounter: Payer: Self-pay | Admitting: Internal Medicine

## 2017-09-12 ENCOUNTER — Ambulatory Visit (INDEPENDENT_AMBULATORY_CARE_PROVIDER_SITE_OTHER): Payer: Medicare HMO | Admitting: Internal Medicine

## 2017-09-12 VITALS — BP 118/68 | HR 98 | Temp 98.2°F | Wt 258.0 lb

## 2017-09-12 DIAGNOSIS — J069 Acute upper respiratory infection, unspecified: Secondary | ICD-10-CM

## 2017-09-12 DIAGNOSIS — I10 Essential (primary) hypertension: Secondary | ICD-10-CM | POA: Diagnosis not present

## 2017-09-12 LAB — BASIC METABOLIC PANEL
BUN: 9 mg/dL (ref 6–23)
CHLORIDE: 101 meq/L (ref 96–112)
CO2: 31 mEq/L (ref 19–32)
Calcium: 9.3 mg/dL (ref 8.4–10.5)
Creatinine, Ser: 1.11 mg/dL (ref 0.40–1.20)
GFR: 65.33 mL/min (ref >60.00)
Glucose, Bld: 107 mg/dL — ABNORMAL HIGH (ref 70–99)
Potassium: 4 mEq/L (ref 3.5–5.1)
SODIUM: 139 meq/L (ref 135–145)

## 2017-09-12 MED ORDER — AZITHROMYCIN 250 MG PO TABS: ORAL_TABLET | ORAL | 0 refills | Status: DC

## 2017-09-12 MED ORDER — HYDRALAZINE HCL 10 MG PO TABS: 10.0000 mg | ORAL_TABLET | Freq: Two times a day (BID) | ORAL | 1 refills | Status: DC

## 2017-09-12 NOTE — Progress Notes (Signed)
Subjective:    Patient ID: Maria Franklin, female    DOB: 11/02/1961, 56 y.o.   MRN: [id number]  HPI  Pt presents to the clinic today for 2 week follow up of HTN. At her last visit, her HCTZ and Potassium were stopped secondary to chronic diuretic induced hypokalemia despite supplementation. She was started on Hydralazine 10 mg BID in addition to her Amlodipine, Hydralazine and Carvedilol. She has been taking all medications as prescribed. Her BP today is.  She also c/o a nasal congestion and cough. This started 2 weeks ago. She is not able to blow anything out of her nose.The cough is productive of yellow/green mucous. She denies fever, chills or body aches. She has not tried anything OTC. She does have a history of asthma and uses Albuterol prn. She also has a history of allergies, controlled on Zyrtec and  Nasonex. She has had sick contacts with similar symptoms.  Review of Systems      Past Medical History:  Diagnosis Date  . Allergy   . Asthma   . Chicken pox   . Hyperlipidemia   . Hypertension   . Thyroid disease     Current Outpatient Prescriptions  Medication Sig Dispense Refill  . amLODipine (NORVASC) 10 MG tablet TAKE 1 TABLET BY MOUTH ONCE DAILY 90 tablet 0  . atorvastatin (LIPITOR) 10 MG tablet TAKE 1 TABLET BY MOUTH ONCE DAILY 90 tablet 0  . B Complex-C (B-COMPLEX WITH VITAMIN C) tablet Take 1 tablet by mouth daily. 90 tablet 0  . carvedilol (COREG) 12.5 MG tablet TAKE 1 TABLET BY MOUTH TWICE DAILY WITH MEALS 180 tablet 0  . cetirizine (ZYRTEC) 10 MG tablet TAKE ONE TABLET BY MOUTH ONCE DAILY 90 tablet 1  . haloperidol (HALDOL) 1 MG tablet Take 2 tablets (2 mg total) by mouth daily. 60 tablet 2  . hydrALAZINE (APRESOLINE) 10 MG tablet Take 1 tablet (10 mg total) by mouth 2 (two) times daily. 60 tablet 2  . levothyroxine (SYNTHROID, LEVOTHROID) 50 MCG tablet TAKE 1 TABLET BY MOUTH ONCE DAILY BEFORE  BREAKFAST 90 tablet 0  . lisinopril (PRINIVIL,ZESTRIL) 40 MG tablet  TAKE ONE TABLET BY MOUTH ONCE DAILY 90 tablet 0  . mometasone (NASONEX) 50 MCG/ACT nasal spray Place 2 sprays into the nose daily. 17 g 5  . Multiple Vitamin (MULTIVITAMIN WITH MINERALS) TABS tablet Take 1 tablet by mouth daily.    . Omega-3 Fatty Acids (FISH OIL PO) Take 1 capsule by mouth daily.    Marland Kitchen omeprazole (PRILOSEC) 40 MG capsule Take 1 capsule (40 mg total) by mouth daily. 90 capsule 3  . VENTOLIN HFA 108 (90 Base) MCG/ACT inhaler INHALE ONE TO TWO PUFFS BY MOUTH EVERY 6 HOURS AS NEEDED FOR WHEEZING OR SHORTNESS OF BREATH 18 g 2   No current facility-administered medications for this visit.     Allergies  Allergen Reactions  . Pollen Extract Cough    Family History  Problem Relation Age of Onset  . Mental illness Mother   . Hypertension Mother   . Schizophrenia Mother   . Depression Mother   . Cancer Father        LUNG  . Cancer Sister        BREAST  . Breast cancer Sister   . Mental illness Brother   . Hypertension Brother   . Schizophrenia Brother   . Depression Brother   . Heart disease Maternal Grandmother   . Depression Maternal Grandmother   .  Schizophrenia Maternal Grandmother   . Schizophrenia Brother   . Schizophrenia Maternal Uncle   . Breast cancer Maternal Aunt     Social History   Social History  . Marital status: Married    Spouse name: N/A  . Number of children: N/A  . Years of education: N/A   Occupational History  . Not on file.   Social History Main Topics  . Smoking status: Former Smoker    Packs/day: 0.25    Years: 6.00  . Smokeless tobacco: Never Used     Comment: QUIT IN HER EARLY 20'S  . Alcohol use No  . Drug use: No  . Sexual activity: Not Currently   Other Topics Concern  . Not on file   Social History Narrative   3 [place]     Constitutional: Denies fever, malaise, fatigue, headache or abrupt weight changes.  HEENT: Pt reports nasal congestion. Denies eye  pain, eye redness, ear pain, ringing in the ears, wax buildup, runny nose, bloody nose, or sore throat. Respiratory: Pt reports cough. Denies difficulty breathing, shortness of breath.   Cardiovascular: Pt reports swelling in feet. Denies chest pain, chest tightness, palpitations or swelling in the hands.  Neurological: Denies dizziness, difficulty with memory, difficulty with speech or problems with balance and coordination.    No other specific complaints in a complete review of systems (except as listed in HPI above).  Objective:   Physical Exam  BP 118/68   Pulse 98   Temp 98.2 F (36.8 C) (Oral)   Wt 258 lb (117 kg)   SpO2 96%   BMI 44.29 kg/m   Wt Readings from Last 3 Encounters:  09/12/17 258 lb (117 kg)  08/29/17 261 lb (118.4 kg)  05/24/17 257 lb 1.9 oz (116.6 kg)    General: Appears her stated age, obese in NAD. HEENT: Head: normal shape and size, no sinus tenderness noted; Ears: Tm's gray and intact, normal light reflex; Nose: mucosa boggy and moist, septum midline; Throat/Mouth: Teeth present, mucosa pink and moist, no exudate, lesions or ulcerations noted.  Neck:  No adenopathy noted. Cardiovascular: Normal rate and rhythm. S1,S2 noted.  1+ BLE edema.  Pulmonary/Chest: Normal effort with intermittent expiratory wheezing noted. No respiratory distress. No rales or ronchi noted.  Neurological: Alert and oriented.    BMET    Component Value Date/Time   NA 138 08/29/2017 1038   K 3.2 (L) 08/29/2017 1038   CL 100 08/29/2017 1038   CO2 30 08/29/2017 1038   GLUCOSE 159 (H) 08/29/2017 1038   BUN 16 08/29/2017 1038   CREATININE 1.09 08/29/2017 1038   CALCIUM 9.2 08/29/2017 1038   GFRNONAA 45 (L) 08/21/2017 1940   GFRAA 52 (L) 08/21/2017 1940    Lipid Panel     Component Value Date/Time   CHOL 160 05/19/2017 1129   TRIG 102.0 05/19/2017 1129   HDL 57.60 05/19/2017 1129   CHOLHDL 3 05/19/2017 1129   VLDL 20.4 05/19/2017 1129   LDLCALC 82 05/19/2017 1129     CBC    Component Value Date/Time   WBC 8.7 08/21/2017 1940   RBC 4.08 08/21/2017 1940   HGB 11.0 (L) 08/21/2017 1940   HCT 34.8 (L) 08/21/2017 1940   PLT 252 08/21/2017 1940   MCV 85.3 08/21/2017 1940   MCH 27.0 08/21/2017 1940   MCHC 31.6 08/21/2017 1940   RDW 14.8 08/21/2017 1940   LYMPHSABS 1.7 01/14/2014 1224  MONOABS 0.3 01/14/2014 1224   EOSABS 0.1 01/14/2014 1224   BASOSABS 0.0 01/14/2014 1224    Hgb A1C Lab Results  Component Value Date   HGBA1C 6.9 (H) 05/19/2017          Assessment & Plan:  Acute Upper Respiratory Infection with Cough:  eRx for Azithromax x 5 days Continue Albuterol prn Delsym as needed for cough   Make an appt for your Medicare Wellness Exam Webb Silversmith, NP

## 2017-09-12 NOTE — Patient Instructions (Signed)
Upper Respiratory Infection, Adult Most upper respiratory infections (URIs) are caused by a virus. A URI affects the nose, throat, and upper air passages. The most common type of URI is often called "the common cold." Follow these instructions at home:  Take medicines only as told by your doctor.  Gargle warm saltwater or take cough drops to comfort your throat as told by your doctor.  Use a warm mist humidifier or inhale steam from a shower to increase air moisture. This may make it easier to breathe.  Drink enough fluid to keep your pee (urine) clear or pale yellow.  Eat soups and other clear broths.  Have a healthy diet.  Rest as needed.  Go back to work when your fever is gone or your doctor says it is okay. ? You may need to stay home longer to avoid giving your URI to others. ? You can also wear a face mask and wash your hands often to prevent spread of the virus.  Use your inhaler more if you have asthma.  Do not use any tobacco products, including cigarettes, chewing tobacco, or electronic cigarettes. If you need help quitting, ask your doctor. Contact a doctor if:  You are getting worse, not better.  Your symptoms are not helped by medicine.  You have chills.  You are getting more short of breath.  You have brown or red mucus.  You have yellow or brown discharge from your nose.  You have pain in your face, especially when you bend forward.  You have a fever.  You have puffy (swollen) neck glands.  You have pain while swallowing.  You have white areas in the back of your throat. Get help right away if:  You have very bad or constant: ? Headache. ? Ear pain. ? Pain in your forehead, behind your eyes, and over your cheekbones (sinus pain). ? Chest pain.  You have long-lasting (chronic) lung disease and any of the following: ? Wheezing. ? Long-lasting cough. ? Coughing up blood. ? A change in your usual mucus.  You have a stiff neck.  You have  changes in your: ? Vision. ? Hearing. ? Thinking. ? Mood. This information is not intended to replace advice given to you by your health care provider. Make sure you discuss any questions you have with your health care provider. Document Released: 05/10/2008 Document Revised: 07/25/2016 Document Reviewed: 02/27/2014 Elsevier Interactive Patient Education  2018 [employer]  

## 2017-09-12 NOTE — Assessment & Plan Note (Signed)
Controlled on Hydralazine, Carvedilol, Lisinopril and Amlodipine BMET today Hydralazine refilled per request

## 2017-09-26 ENCOUNTER — Encounter: Payer: Self-pay | Admitting: Internal Medicine

## 2017-10-06 DIAGNOSIS — G4733 Obstructive sleep apnea (adult) (pediatric): Secondary | ICD-10-CM | POA: Diagnosis not present

## 2017-10-15 ENCOUNTER — Other Ambulatory Visit: Payer: Self-pay | Admitting: Internal Medicine

## 2017-10-17 ENCOUNTER — Other Ambulatory Visit: Payer: Self-pay | Admitting: Internal Medicine

## 2017-11-15 ENCOUNTER — Other Ambulatory Visit: Payer: Self-pay | Admitting: Internal Medicine

## 2017-11-17 ENCOUNTER — Encounter (HOSPITAL_COMMUNITY): Payer: Self-pay | Admitting: Psychiatry

## 2017-11-17 ENCOUNTER — Ambulatory Visit (INDEPENDENT_AMBULATORY_CARE_PROVIDER_SITE_OTHER): Payer: Medicare HMO | Admitting: Psychiatry

## 2017-11-17 VITALS — BP 138/88 | HR 71 | Temp 98.7°F | Resp 18 | Ht 64.0 in | Wt 262.0 lb

## 2017-11-17 DIAGNOSIS — R69 Illness, unspecified: Secondary | ICD-10-CM | POA: Diagnosis not present

## 2017-11-17 DIAGNOSIS — Z818 Family history of other mental and behavioral disorders: Secondary | ICD-10-CM | POA: Diagnosis not present

## 2017-11-17 DIAGNOSIS — Z87891 Personal history of nicotine dependence: Secondary | ICD-10-CM | POA: Diagnosis not present

## 2017-11-17 DIAGNOSIS — F200 Paranoid schizophrenia: Secondary | ICD-10-CM

## 2017-11-17 MED ORDER — HALOPERIDOL 1 MG PO TABS: 2.0000 mg | ORAL_TABLET | Freq: Every day | ORAL | 5 refills | Status: DC

## 2017-11-17 NOTE — Progress Notes (Signed)
[facility] MD/PA/NP OP Progress Note  11/17/2017 8:36 AM Maria Franklin  MRN:  [id number]  Chief Complaint: I am taking my medication.  I am sleeping good.  HPI: Maria Franklin came for her follow-up appointment.  She is compliant with medication.  She is taking Haldol 1 mg 2 tablet every night which is helping her paranoia and hallucination.  She was sad because her daughter moved to [place] which is 2-1/2-hour away.  She was very close to her 57-year-old grandson.  She excited because she is going to Saturday to drop Christmas present.  Patient denies any side effects of the medication.  In September she went to the emergency room because of chest pain and recently her blood pressure medication were adjusted.  Patient denies any irritability, mania, anger, depression or any suicidal thoughts.  Patient denies drinking alcohol or using any illegal substances.  Her appetite is okay.  Patient wants to continue Haldol.  She lives with her husband who is very supportive.  Visit Diagnosis:    ICD-10-CM   1. Paranoid schizophrenia, chronic condition ([place]) F20.0 haloperidol (HALDOL) 1 MG tablet    Past Psychiatric History: Reviewed. Patient has history of paranoia and delusions. She has one hospitalization at [facility]. She took overdose on her medication. She was noncompliant with medication and required admission 4 months later. She had tried Abilify with good response however at that time she could not afford it. Patient denies any history of physical sexual verbal and emotional abuse.  Past Medical History:  Past Medical History:  Diagnosis Date  . Allergy   . Asthma   . Chicken pox   . Hyperlipidemia   . Hypertension   . Thyroid disease     Past Surgical History:  Procedure Laterality Date  . SINUS IRRIGATION    . TONSILLECTOMY      Family Psychiatric History: Reviewed.  Family History:  Family History  Problem Relation Age of Onset  . Mental illness Mother   . Hypertension  Mother   . Schizophrenia Mother   . Depression Mother   . Cancer Father        LUNG  . Cancer Sister        BREAST  . Breast cancer Sister   . Mental illness Brother   . Hypertension Brother   . Schizophrenia Brother   . Depression Brother   . Heart disease Maternal Grandmother   . Depression Maternal Grandmother   . Schizophrenia Maternal Grandmother   . Schizophrenia Brother   . Schizophrenia Maternal Uncle   . Breast cancer Maternal Aunt     Social History:  Social History   Socioeconomic History  . Marital status: Married    Spouse name: Not on file  . Number of children: Not on file  . Years of education: Not on file  . Highest education level: Not on file  Social Needs  . Financial resource strain: Not on file  . Food insecurity - worry: Not on file  . Food insecurity - inability: Not on file  . Transportation needs - medical: Not on file  . Transportation needs - non-medical: Not on file  Occupational History  . Not on file  Tobacco Use  . Smoking status: Former Smoker    Packs/day: 0.25    Years: 6.00    Pack years: 1.50  . Smokeless tobacco: Never Used  . Tobacco comment: QUIT IN HER EARLY 20'S  Substance and Sexual Activity  . Alcohol use: No  Alcohol/week: 0.0 oz  . Drug use: No  . Sexual activity: Not Currently  Other Topics Concern  . Not on file  Social History Narrative   3 CHILDREN   RECENTLY MOVED TO [place]   SEEKING EMPLOYMENT    Allergies:  Allergies  Allergen Reactions  . Pollen Extract Cough    Metabolic Disorder Labs: Lab Results  Component Value Date   HGBA1C 6.9 (H) 05/19/2017   No results found for: PROLACTIN Lab Results  Component Value Date   CHOL 160 05/19/2017   TRIG 102.0 05/19/2017   HDL 57.60 05/19/2017   CHOLHDL 3 05/19/2017   VLDL 20.4 05/19/2017   LDLCALC 82 05/19/2017   LDLCALC 83 11/04/2016   Lab Results  Component Value Date   TSH 2.39 05/19/2017   TSH 2.05 11/04/2016    Therapeutic  Level Labs: No results found for: LITHIUM No results found for: VALPROATE No components found for:  CBMZ  Current Medications: Current Outpatient Medications  Medication Sig Dispense Refill  . amLODipine (NORVASC) 10 MG tablet TAKE 1 TABLET BY MOUTH ONCE DAILY 90 tablet 0  . atorvastatin (LIPITOR) 10 MG tablet Take 1 tablet (10 mg total) daily by mouth. MUST SCHEDULE ANNUAL PHYSICAL FOR REFILLS 90 tablet 0  . azithromycin (ZITHROMAX) 250 MG tablet Take 2 tabs today, then 1 tab daily x 4 days 6 tablet 0  . B Complex-C (B-COMPLEX WITH VITAMIN C) tablet Take 1 tablet by mouth daily. 90 tablet 0  . carvedilol (COREG) 12.5 MG tablet TAKE 1 TABLET BY MOUTH TWICE DAILY WITH MEALS 180 tablet 0  . cetirizine (ZYRTEC) 10 MG tablet TAKE ONE TABLET BY MOUTH ONCE DAILY 90 tablet 1  . haloperidol (HALDOL) 1 MG tablet Take 2 tablets (2 mg total) by mouth daily. 60 tablet 2  . hydrALAZINE (APRESOLINE) 10 MG tablet Take 1 tablet (10 mg total) by mouth 2 (two) times daily. 180 tablet 1  . levothyroxine (SYNTHROID, LEVOTHROID) 50 MCG tablet TAKE 1 TABLET BY MOUTH ONCE DAILY BEFORE  BREAKFAST 90 tablet 0  . lisinopril (PRINIVIL,ZESTRIL) 40 MG tablet Take 1 tablet (40 mg total) daily by mouth. SCHEDULE ANNUAL PHYSICAL 90 tablet 0  . mometasone (NASONEX) 50 MCG/ACT nasal spray Place 2 sprays into the nose daily. 17 g 5  . Multiple Vitamin (MULTIVITAMIN WITH MINERALS) TABS tablet Take 1 tablet by mouth daily.    . Omega-3 Fatty Acids (FISH OIL PO) Take 1 capsule by mouth daily.    Marland Kitchen omeprazole (PRILOSEC) 40 MG capsule Take 1 capsule (40 mg total) by mouth daily. 90 capsule 3  . VENTOLIN HFA 108 (90 Base) MCG/ACT inhaler INHALE ONE TO TWO PUFFS BY MOUTH EVERY 6 HOURS AS NEEDED FOR WHEEZING OR SHORTNESS OF BREATH 18 g 2   No current facility-administered medications for this visit.      Musculoskeletal: Strength & Muscle Tone: within normal limits Gait & Station: normal Patient leans: N/A  Psychiatric  Specialty Exam: ROS  Blood pressure 138/88, pulse 71, temperature 98.7 F (37.1 C), temperature source Oral, resp. rate 18, height 5\' 4"  (1.626 m), weight 262 lb (118.8 kg), SpO2 100 %.There is no height or weight on file to calculate BMI.  General Appearance: Casual  Eye Contact:  Good  Speech:  Clear and Coherent  Volume:  Normal  Mood:  Euthymic  Affect:  Congruent  Thought Process:  Goal Directed  Orientation:  Full (Time, Place, and Person)  Thought Content: Logical   Suicidal Thoughts:  No  Homicidal Thoughts:  No  Memory:  Immediate;   Good Recent;   Good Remote;   Good  Judgement:  Good  Insight:  Good  Psychomotor Activity:  Normal  Concentration:  Concentration: Good and Attention Span: Good  Recall:  Good  Fund of Knowledge: Good  Language: Good  Akathisia:  No  Handed:  Right  AIMS (if indicated): not done  Assets:  Communication Skills Desire for Improvement Housing Resilience Social Support  ADL's:  Intact  Cognition: WNL  Sleep:  Good   Screenings: PHQ2-9     Office Visit from 05/19/2017 in [place] at [facility] Visit from 11/04/2016 in [place] at [facility] Visit from 04/29/2016 in [place] at [facility] Visit from 10/28/2015 in [place] at [facility] Visit from 04/21/2015 in [place] at [facility] Total Score  1  0  0  0  0       Assessment and Plan: Chronic schizophrenia paranoid type.    Patient is doing better on her current Haldol dose.  I reviewed blood work results from the emergency room visits.  Patient has no tremors shakes or any EPS.  Continue Haldol 2 mg at bedtime.  Discussed healthy lifestyle.  Patient is not interested in counseling.  Recommended to call us back if she has any question, concern or if she feels worsening of the symptoms.  Follow-up in 6 months.     Kathlee Nations, MD 11/17/2017, 8:36 AM

## 2017-12-12 ENCOUNTER — Ambulatory Visit: Payer: Self-pay | Admitting: Pulmonary Disease

## 2017-12-27 ENCOUNTER — Encounter: Payer: Self-pay | Admitting: Internal Medicine

## 2018-01-06 ENCOUNTER — Telehealth: Payer: Self-pay

## 2018-01-06 NOTE — Telephone Encounter (Signed)
I will put in the referral to an allergist. As far as the other issues and referral to endo, she needs to come discuss this with me before referral can be placed. 30 minute appt.

## 2018-01-06 NOTE — Telephone Encounter (Signed)
I spoke with pt;needing to see allergy dr due to taking zyrtec for along time; pt has had 2 sinus surgeries due to allergies and polyps. Pt used to take allergy shots and would like to restart allergy shots. Pt wants to see endocrinologist about being borderline diabetic and pt having issues that are hereditary such as weight issues and A1C testing and if cortisone levels normal and wants to get further thorough check for hypothyroidism. Pt request cb. Pt last seen 09/12/17.

## 2018-01-06 NOTE — Telephone Encounter (Signed)
Copied from [facility]. Topic: Referral - Request >> Jan 06, 2018  9:16 AM Ether Griffins B wrote: Reason for CRM: pt would like referrals for allergy doctor and endocrinologist

## 2018-01-09 NOTE — Telephone Encounter (Signed)
Patient notified as instructed by telephone and verbalized understanding. Patient stated that she would like the referral to the allergist and will hold off on the other referral. Patient stated that she has an appointment scheduled with Webb Silversmith NP for her annual physical in [place] but does not think that she can talk about a referral to endo at that appointment. Patient said to just disregard referral to endo until after she has her physical. Advised patient that she will hear from one of the referral coordinators about to help her get set up with allergist.

## 2018-01-10 ENCOUNTER — Other Ambulatory Visit: Payer: Self-pay | Admitting: Internal Medicine

## 2018-01-10 DIAGNOSIS — Z9889 Other specified postprocedural states: Secondary | ICD-10-CM

## 2018-01-10 DIAGNOSIS — Z9109 Other allergy status, other than to drugs and biological substances: Secondary | ICD-10-CM

## 2018-01-11 ENCOUNTER — Other Ambulatory Visit: Payer: Self-pay | Admitting: Internal Medicine

## 2018-01-11 DIAGNOSIS — J302 Other seasonal allergic rhinitis: Secondary | ICD-10-CM

## 2018-01-14 ENCOUNTER — Other Ambulatory Visit: Payer: Self-pay | Admitting: Internal Medicine

## 2018-01-19 ENCOUNTER — Telehealth: Payer: Self-pay

## 2018-01-19 NOTE — Telephone Encounter (Signed)
error 

## 2018-01-23 ENCOUNTER — Other Ambulatory Visit: Payer: Self-pay | Admitting: Internal Medicine

## 2018-01-23 DIAGNOSIS — Z9109 Other allergy status, other than to drugs and biological substances: Secondary | ICD-10-CM

## 2018-01-23 NOTE — Progress Notes (Signed)
mb r 

## 2018-01-31 ENCOUNTER — Other Ambulatory Visit: Payer: Self-pay | Admitting: Internal Medicine

## 2018-02-03 ENCOUNTER — Ambulatory Visit: Payer: Self-pay | Admitting: Internal Medicine

## 2018-02-07 ENCOUNTER — Ambulatory Visit
Admission: RE | Admit: 2018-02-07 | Discharge: 2018-02-07 | Disposition: A | Discharge status: Home / Self Care | Payer: Medicare HMO | Source: Ambulatory Visit | Attending: Allergy | Admitting: Allergy

## 2018-02-07 ENCOUNTER — Other Ambulatory Visit: Payer: Self-pay | Admitting: Allergy

## 2018-02-07 ENCOUNTER — Other Ambulatory Visit: Payer: Self-pay | Admitting: Internal Medicine

## 2018-02-07 DIAGNOSIS — R05 Cough: Secondary | ICD-10-CM | POA: Diagnosis not present

## 2018-02-07 DIAGNOSIS — J3081 Allergic rhinitis due to animal (cat) (dog) hair and dander: Secondary | ICD-10-CM | POA: Diagnosis not present

## 2018-02-07 DIAGNOSIS — J309 Allergic rhinitis, unspecified: Secondary | ICD-10-CM | POA: Diagnosis not present

## 2018-02-07 DIAGNOSIS — J4520 Mild intermittent asthma, uncomplicated: Secondary | ICD-10-CM

## 2018-02-07 DIAGNOSIS — R69 Illness, unspecified: Secondary | ICD-10-CM | POA: Diagnosis not present

## 2018-02-07 DIAGNOSIS — J3089 Other allergic rhinitis: Secondary | ICD-10-CM | POA: Diagnosis not present

## 2018-02-07 DIAGNOSIS — J302 Other seasonal allergic rhinitis: Secondary | ICD-10-CM

## 2018-02-14 ENCOUNTER — Other Ambulatory Visit: Payer: Self-pay

## 2018-02-14 ENCOUNTER — Ambulatory Visit (INDEPENDENT_AMBULATORY_CARE_PROVIDER_SITE_OTHER): Payer: Medicare HMO | Admitting: Internal Medicine

## 2018-02-14 ENCOUNTER — Encounter: Payer: Self-pay | Admitting: Internal Medicine

## 2018-02-14 VITALS — BP 132/84 | HR 68 | Temp 98.0°F | Ht 63.0 in | Wt 263.0 lb

## 2018-02-14 DIAGNOSIS — K122 Cellulitis and abscess of mouth: Secondary | ICD-10-CM

## 2018-02-14 DIAGNOSIS — Z23 Encounter for immunization: Secondary | ICD-10-CM | POA: Diagnosis not present

## 2018-02-14 DIAGNOSIS — E039 Hypothyroidism, unspecified: Secondary | ICD-10-CM

## 2018-02-14 DIAGNOSIS — J301 Allergic rhinitis due to pollen: Secondary | ICD-10-CM | POA: Diagnosis not present

## 2018-02-14 DIAGNOSIS — E559 Vitamin D deficiency, unspecified: Secondary | ICD-10-CM | POA: Diagnosis not present

## 2018-02-14 DIAGNOSIS — K219 Gastro-esophageal reflux disease without esophagitis: Secondary | ICD-10-CM

## 2018-02-14 DIAGNOSIS — J3089 Other allergic rhinitis: Secondary | ICD-10-CM | POA: Diagnosis not present

## 2018-02-14 DIAGNOSIS — E119 Type 2 diabetes mellitus without complications: Secondary | ICD-10-CM | POA: Diagnosis not present

## 2018-02-14 DIAGNOSIS — Z0000 Encounter for general adult medical examination without abnormal findings: Secondary | ICD-10-CM

## 2018-02-14 DIAGNOSIS — E782 Mixed hyperlipidemia: Secondary | ICD-10-CM | POA: Diagnosis not present

## 2018-02-14 DIAGNOSIS — F200 Paranoid schizophrenia: Secondary | ICD-10-CM

## 2018-02-14 DIAGNOSIS — J302 Other seasonal allergic rhinitis: Secondary | ICD-10-CM | POA: Diagnosis not present

## 2018-02-14 DIAGNOSIS — G4733 Obstructive sleep apnea (adult) (pediatric): Secondary | ICD-10-CM

## 2018-02-14 DIAGNOSIS — J4530 Mild persistent asthma, uncomplicated: Secondary | ICD-10-CM | POA: Diagnosis not present

## 2018-02-14 DIAGNOSIS — I10 Essential (primary) hypertension: Secondary | ICD-10-CM | POA: Diagnosis not present

## 2018-02-14 DIAGNOSIS — J3081 Allergic rhinitis due to animal (cat) (dog) hair and dander: Secondary | ICD-10-CM | POA: Diagnosis not present

## 2018-02-14 LAB — HEMOGLOBIN A1C: Hgb A1c MFr Bld: 6.6 % — ABNORMAL HIGH (ref 4.6–6.5)

## 2018-02-14 LAB — TSH: TSH: 2.99 u[IU]/mL (ref 0.35–4.50)

## 2018-02-14 LAB — CBC
HCT: 34.7 % — ABNORMAL LOW (ref 36.0–46.0)
Hemoglobin: 11.2 g/dL — ABNORMAL LOW (ref 12.0–15.0)
MCHC: 32.3 g/dL (ref 30.0–36.0)
MCV: 83.1 fl (ref 78.0–100.0)
Platelets: 285 10*3/uL (ref 150.0–400.0)
RBC: 4.18 Mil/uL (ref 3.87–5.11)
RDW: 14.8 % (ref 11.5–15.5)
WBC: 8.6 10*3/uL (ref 4.0–10.5)

## 2018-02-14 LAB — VITAMIN D 25 HYDROXY (VIT D DEFICIENCY, FRACTURES): VITD: 27.88 ng/mL — AB (ref 30.00–100.00)

## 2018-02-14 LAB — COMPREHENSIVE METABOLIC PANEL
ALK PHOS: 110 U/L (ref 39–117)
ALT: 20 U/L (ref 0–35)
AST: 18 U/L (ref 0–37)
Albumin: 4.1 g/dL (ref 3.5–5.2)
BUN: 15 mg/dL (ref 6–23)
CO2: 32 mEq/L (ref 19–32)
CREATININE: 1.04 mg/dL (ref 0.40–1.20)
Calcium: 9.6 mg/dL (ref 8.4–10.5)
Chloride: 103 mEq/L (ref 96–112)
GFR: 70.32 mL/min (ref >60.00)
GLUCOSE: 112 mg/dL — AB (ref 70–99)
POTASSIUM: 4 meq/L (ref 3.5–5.1)
SODIUM: 141 meq/L (ref 135–145)
TOTAL PROTEIN: 7.5 g/dL (ref 6.0–8.3)
Total Bilirubin: 0.3 mg/dL (ref 0.2–1.2)

## 2018-02-14 LAB — LIPID PANEL
CHOL/HDL RATIO: 2
Cholesterol: 155 mg/dL (ref 0–200)
HDL: 66.3 mg/dL (ref >39.00)
LDL CALC: 72 mg/dL (ref 0–99)
NONHDL: 88.99
TRIGLYCERIDES: 84 mg/dL (ref 0.0–149.0)
VLDL: 16.8 mg/dL (ref 0.0–40.0)

## 2018-02-14 LAB — T4, FREE: Free T4: 0.85 ng/dL (ref 0.60–1.60)

## 2018-02-14 NOTE — Assessment & Plan Note (Addendum)
CMET and Lipid profile today Encouraged her to consume a low fat diet Continue Atorvastatin and Fish Oil , will adjust if needed based on labs

## 2018-02-14 NOTE — Assessment & Plan Note (Signed)
Encouraged her to continue to use her CPAP Discussed how weight loss can help improve her sleep apnea

## 2018-02-14 NOTE — Assessment & Plan Note (Signed)
Controlled on Zyrtec and Nasonex Will monitor

## 2018-02-14 NOTE — Assessment & Plan Note (Signed)
Controlled on Singulair She can not afford inhalers She will continue to follow with pulmonology

## 2018-02-14 NOTE — Patient Instructions (Signed)

## 2018-02-14 NOTE — Assessment & Plan Note (Signed)
A1C today No microalbumin secondary to ACEI/ARB therapy Encouraged her to consume a low carb diet and exercise for weight loss Foot exam today Encouraged yearly eye exams Flu shot today Pneumovax UTD

## 2018-02-14 NOTE — Assessment & Plan Note (Signed)
Controlled on multiple meds CBC and CMET today Reinforced DASH diet and exercise for weight loss

## 2018-02-14 NOTE — Assessment & Plan Note (Signed)
TSH and Free T4 today Continue Synthroid for now, will adjust if needed based on labs

## 2018-02-14 NOTE — Assessment & Plan Note (Signed)
CBC and CMET today Controlled on Omeprazole  Discussed how weight loss can help improve her reflux

## 2018-02-14 NOTE — Assessment & Plan Note (Signed)
No angina at this time Will monitor

## 2018-02-14 NOTE — Progress Notes (Signed)
HPI:  Pt presents to the clinic today for her Medicare Wellness Exam. She is also due to follow up chronic conditions.  Seasonal Allergies: Worse in the spring. She has had sinus surgery x 2. She takes Zyrtec and Nasonex daily with good relief.  HTN: her BP today is 132/84. She is taking Amlodipine, Hydralazine, Lisinopril and Carvedilol as prescribed. ECG from 08/2017 reviewed.  Asthma, Mild Persistent: She is taking Singulair as prescribed. She is not able to afford the Advair or Symbicort. She uses Albuterol. She follows with pulmonology.  [place] Angina: She does not follow with cardiology. She is on Carvedilol and ASA.  DM 2: Her last A1C was 6.9%, 05/2017. She does not check her sugars regularly. She is not on any oral diabetic medications. She does report burning sensation in her feet. She checks her feet daily. Her last eye exam was 11/2016.  HLD: Her last LDL was 82, 05/2017. She is taking Atorvastatin and Fish Oil as prescribed. She denies myalgias. She does not consume a low fat diet.  Hypothyroidism: She denies any issues on her current dose of Synthroid.  Paranoid Schizophrenia: Chronic but stable on Haldol. She follows with Dr. Adele Schilder.  GERD: Triggered by eating too much and then laying down. She is taking Omeprazole as prescribed. She denies breakthrough symptoms.  OSA: She averages 6-7 hours a night with the use of her CPAP. She feels rested when she wakes up.    Past Medical History:  Diagnosis Date  . Allergy   . Asthma   . Chicken pox   . Hyperlipidemia   . Hypertension   . Thyroid disease     Current Outpatient Medications  Medication Sig Dispense Refill  . albuterol (VENTOLIN HFA) 108 (90 Base) MCG/ACT inhaler INHALE ONE TO TWO PUFFS INTO LUNGS EVERY 6 HOURS AS NEEDED FOR WHEEZING OR SHORTNESS OF BREATH 18 each 2  . amLODipine (NORVASC) 10 MG tablet TAKE 1 TABLET BY MOUTH ONCE DAILY 90 tablet 0  . atorvastatin (LIPITOR) 10 MG tablet Take 1 tablet (10 mg total)  by mouth daily at 6 PM. 90 tablet 0  . B Complex-C (B-COMPLEX WITH VITAMIN C) tablet Take 1 tablet by mouth daily. 90 tablet 0  . carvedilol (COREG) 12.5 MG tablet TAKE 1 TABLET BY MOUTH TWICE DAILY WITH MEALS 180 tablet 0  . cetirizine (ZYRTEC) 10 MG tablet TAKE ONE TABLET BY MOUTH ONCE DAILY 90 tablet 1  . Fluticasone-Salmeterol (ADVAIR DISKUS) 250-50 MCG/DOSE AEPB INHALE ONE DOSE BY MOUTH TWICE DAILY 120 each 1  . haloperidol (HALDOL) 1 MG tablet Take 2 tablets (2 mg total) by mouth daily. 60 tablet 5  . hydrALAZINE (APRESOLINE) 10 MG tablet Take 1 tablet (10 mg total) by mouth 2 (two) times daily. 180 tablet 1  . levothyroxine (SYNTHROID, LEVOTHROID) 50 MCG tablet TAKE 1 TABLET BY MOUTH ONCE DAILY BEFORE  BREAKFAST 90 tablet 0  . lisinopril (PRINIVIL,ZESTRIL) 40 MG tablet Take 1 tablet (40 mg total) by mouth daily. 90 tablet 0  . mometasone (NASONEX) 50 MCG/ACT nasal spray Place 2 sprays into the nose daily. 17 g 5  . Multiple Vitamin (MULTIVITAMIN WITH MINERALS) TABS tablet Take 1 tablet by mouth daily.    . Omega-3 Fatty Acids (FISH OIL PO) Take 1 capsule by mouth daily.    Marland Kitchen omeprazole (PRILOSEC) 40 MG capsule Take 1 capsule (40 mg total) by mouth daily. 90 capsule 3   No current facility-administered medications for this visit.     Allergies  Allergen Reactions  . Pollen Extract Cough    Family History  Problem Relation Age of Onset  . Mental illness Mother   . Hypertension Mother   . Schizophrenia Mother   . Depression Mother   . Cancer Father        LUNG  . Cancer Sister        BREAST  . Breast cancer Sister   . Mental illness Brother   . Hypertension Brother   . Schizophrenia Brother   . Depression Brother   . Heart disease Maternal Grandmother   . Depression Maternal Grandmother   . Schizophrenia Maternal Grandmother   . Schizophrenia Brother   . Schizophrenia Maternal Uncle   . Breast cancer Maternal Aunt     Social History   Socioeconomic History  .  Marital status: Married    Spouse name: Not on file  . Number of children: Not on file  . Years of education: Not on file  . Highest education level: Not on file  Social Needs  . Financial resource strain: Not on file  . Food insecurity - worry: Not on file  . Food insecurity - inability: Not on file  . Transportation needs - medical: Not on file  . Transportation needs - non-medical: Not on file  Occupational History  . Not on file  Tobacco Use  . Smoking status: Former Smoker    Packs/day: 0.25    Years: 6.00    Pack years: 1.50  . Smokeless tobacco: Never Used  . Tobacco comment: QUIT IN HER EARLY 20'S  Substance and Sexual Activity  . Alcohol use: No    Alcohol/week: 0.0 oz  . Drug use: No  . Sexual activity: Not Currently  Other Topics Concern  . Not on file  Social History Narrative   3 CHILDREN   RECENTLY MOVED TO [place] [address]: None  Health Maintenance:    Flu: 10/2016  Tetanus: 04/2016  Pneumovax: 10/2014  Mammogram: 08/2017  Pap Smear: 01/2014  Colon Screening: 12/2011  Eye Doctor: as needed  Dental Exam: as needed   Providers:   PCP: Webb Silversmith, NP-C  Psychiatrist: Dr. Adele Schilder  Pulmonologist: Dr. Alva Garnet   I have personally reviewed and have noted:  1. The patient's medical and social history 2. Their use of alcohol, tobacco or illicit drugs 3. Their current medications and supplements 4. The patient's functional ability including ADL's, fall risks, home safety risks and hearing or visual impairment. 5. Diet and physical activities 6. Evidence for depression or mood disorder  Subjective:   Review of Systems:   Constitutional: Denies fever, malaise, fatigue, headache or abrupt weight changes.  HEENT: Denies eye pain, eye redness, ear pain, ringing in the ears, wax buildup, runny nose, nasal congestion, bloody nose, or sore throat. Respiratory: Denies difficulty breathing, shortness of breath, cough or  sputum production.   Cardiovascular: Denies chest pain, chest tightness, palpitations or swelling in the hands or feet.  Gastrointestinal: t reports intermittent reflux. Denies abdominal pain, bloating, constipation, diarrhea or blood in the stool.  GU: Denies urgency, frequency, pain with urination, burning sensation, blood in urine, odor or discharge. Musculoskeletal: Denies decrease in range of motion, difficulty with gait, muscle pain or joint pain and swelling.  Skin: Denies redness, rashes, lesions or ulcercations.  Neurological: Pt reports difficulty with memory. Denies dizziness, difficulty with speech or problems with balance and coordination.  Psych: Pt has a history of depression. Denies anxiety, SI/HI.  No other specific complaints in a complete review of systems (except as listed in HPI above).  Objective:  PE:   BP 132/84   Pulse 68   Temp 98 F (36.7 C) (Oral)   Ht 5\' 3"  (1.6 m)   Wt 263 lb (119.3 kg)   SpO2 98%   BMI 46.59 kg/m   Wt Readings from Last 3 Encounters:  09/12/17 258 lb (117 kg)  08/29/17 261 lb (118.4 kg)  05/24/17 257 lb 1.9 oz (116.6 kg)    General: Appears her stated age, obese in NAD. Skin: Warm, dry and intact. No ulcerations noted. HEENT: Head: normal shape and size; Eyes: sclera white, no icterus, conjunctiva pink, PERRLA and EOMs intact; Ears: Tm's gray and intact, normal light reflex; Throat/Mouth: Teeth present, mucosa pink and moist, no exudate, lesions or ulcerations noted.  Neck: Neck supple, trachea midline. No masses, lumps or thyromegaly present.  Cardiovascular: Normal rate and rhythm. S1,S2 noted.  No murmur, rubs or gallops noted. No JVD or BLE edema. No carotid bruits noted. Pulmonary/Chest: Normal effort and positive vesicular breath sounds. No respiratory distress. No wheezes, rales or ronchi noted.  Abdomen: Soft and nontender. Normal bowel sounds. No distention or masses noted. Liver, spleen and kidneys non  palpable. Musculoskeletal: Strength 5/5 BUE/BLE. No signs of joint swelling.  Neurological: Alert and oriented. Cranial nerves II-XII grossly intact. Coordination normal.  Psychiatric: Mood and affect normal. Behavior is normal. Judgment and thought content normal.    BMET    Component Value Date/Time   NA 139 09/12/2017 1135   K 4.0 09/12/2017 1135   CL 101 09/12/2017 1135   CO2 31 09/12/2017 1135   GLUCOSE 107 (H) 09/12/2017 1135   BUN 9 09/12/2017 1135   CREATININE 1.11 09/12/2017 1135   CALCIUM 9.3 09/12/2017 1135   GFRNONAA 45 (L) 08/21/2017 1940   GFRAA 52 (L) 08/21/2017 1940    Lipid Panel     Component Value Date/Time   CHOL 160 05/19/2017 1129   TRIG 102.0 05/19/2017 1129   HDL 57.60 05/19/2017 1129   CHOLHDL 3 05/19/2017 1129   VLDL 20.4 05/19/2017 1129   LDLCALC 82 05/19/2017 1129    CBC    Component Value Date/Time   WBC 8.7 08/21/2017 1940   RBC 4.08 08/21/2017 1940   HGB 11.0 (L) 08/21/2017 1940   HCT 34.8 (L) 08/21/2017 1940   PLT 252 08/21/2017 1940   MCV 85.3 08/21/2017 1940   MCH 27.0 08/21/2017 1940   MCHC 31.6 08/21/2017 1940   RDW 14.8 08/21/2017 1940   LYMPHSABS 1.7 01/14/2014 1224   MONOABS 0.3 01/14/2014 1224   EOSABS 0.1 01/14/2014 1224   BASOSABS 0.0 01/14/2014 1224    Hgb A1C Lab Results  Component Value Date   HGBA1C 6.9 (H) 05/19/2017      Assessment and Plan:   Medicare Annual Wellness Visit:  Diet: She does eat meat. She consumes more veggies than fruits. She occassionally eats fried foods. She drinks mostly coffee, water and green tea. Physical activity: None Depression/mood screen: Positive, on meds. Hearing: Intact to whispered voice Visual acuity: Grossly normal ADLs: Capable Fall risk: None Home safety: Good Cognitive evaluation: She feels like she has trouble with recall, been leaving the stove on, getting lost while driving. MMSE 29/30 EOL planning: No adv directives, full code/ I agree  Preventative  Medicine: Flu shot today. Tetanus and pneumovax UTD. Pap smear, mammogram and colon screening UTD. Encouraged her to consume a balanced diet and  exercise regimen. Advised her to see an eye doctor and dentist annually, hearing and vision screen today. Will check CBC, CMET, Lipid, A1C, TSH., Free T4 and Vit D today.   Next appointment: 6 months, follow up chronic conditions.   Webb Silversmith, NP

## 2018-02-14 NOTE — Assessment & Plan Note (Addendum)
OiControlled on Haldol She will continue to follow with psych

## 2018-02-17 ENCOUNTER — Encounter: Payer: Self-pay | Admitting: Internal Medicine

## 2018-02-22 DIAGNOSIS — J3081 Allergic rhinitis due to animal (cat) (dog) hair and dander: Secondary | ICD-10-CM | POA: Diagnosis not present

## 2018-02-22 DIAGNOSIS — J301 Allergic rhinitis due to pollen: Secondary | ICD-10-CM | POA: Diagnosis not present

## 2018-02-22 DIAGNOSIS — J3089 Other allergic rhinitis: Secondary | ICD-10-CM | POA: Diagnosis not present

## 2018-02-26 ENCOUNTER — Other Ambulatory Visit: Payer: Self-pay | Admitting: Internal Medicine

## 2018-02-27 ENCOUNTER — Other Ambulatory Visit: Payer: Self-pay | Admitting: Internal Medicine

## 2018-02-28 ENCOUNTER — Other Ambulatory Visit (HOSPITAL_COMMUNITY): Payer: Self-pay

## 2018-02-28 DIAGNOSIS — F200 Paranoid schizophrenia: Secondary | ICD-10-CM

## 2018-02-28 MED ORDER — HALOPERIDOL 5 MG PO TABS: 2.5000 mg | ORAL_TABLET | Freq: Every day | ORAL | 0 refills | Status: DC

## 2018-02-28 MED ORDER — AMLODIPINE BESYLATE 10 MG PO TABS: 10.0000 mg | ORAL_TABLET | Freq: Every day | ORAL | 1 refills | Status: DC

## 2018-02-28 NOTE — Progress Notes (Signed)
Verbal order per Dr. Adele Schilder, 1mg  tabs are to be changed to 5 mg taking 1/2 tab daily. Sent in script to

## 2018-03-01 DIAGNOSIS — J3089 Other allergic rhinitis: Secondary | ICD-10-CM | POA: Diagnosis not present

## 2018-03-01 DIAGNOSIS — J3081 Allergic rhinitis due to animal (cat) (dog) hair and dander: Secondary | ICD-10-CM | POA: Diagnosis not present

## 2018-03-01 DIAGNOSIS — J301 Allergic rhinitis due to pollen: Secondary | ICD-10-CM | POA: Diagnosis not present

## 2018-03-08 DIAGNOSIS — J3089 Other allergic rhinitis: Secondary | ICD-10-CM | POA: Diagnosis not present

## 2018-03-08 DIAGNOSIS — J3081 Allergic rhinitis due to animal (cat) (dog) hair and dander: Secondary | ICD-10-CM | POA: Diagnosis not present

## 2018-03-08 DIAGNOSIS — J301 Allergic rhinitis due to pollen: Secondary | ICD-10-CM | POA: Diagnosis not present

## 2018-03-15 DIAGNOSIS — J3089 Other allergic rhinitis: Secondary | ICD-10-CM | POA: Diagnosis not present

## 2018-03-15 DIAGNOSIS — J3081 Allergic rhinitis due to animal (cat) (dog) hair and dander: Secondary | ICD-10-CM | POA: Diagnosis not present

## 2018-03-15 DIAGNOSIS — J301 Allergic rhinitis due to pollen: Secondary | ICD-10-CM | POA: Diagnosis not present

## 2018-03-22 DIAGNOSIS — J301 Allergic rhinitis due to pollen: Secondary | ICD-10-CM | POA: Diagnosis not present

## 2018-03-22 DIAGNOSIS — J3081 Allergic rhinitis due to animal (cat) (dog) hair and dander: Secondary | ICD-10-CM | POA: Diagnosis not present

## 2018-03-22 DIAGNOSIS — J3089 Other allergic rhinitis: Secondary | ICD-10-CM | POA: Diagnosis not present

## 2018-03-29 DIAGNOSIS — J3081 Allergic rhinitis due to animal (cat) (dog) hair and dander: Secondary | ICD-10-CM | POA: Diagnosis not present

## 2018-03-29 DIAGNOSIS — J3089 Other allergic rhinitis: Secondary | ICD-10-CM | POA: Diagnosis not present

## 2018-03-29 DIAGNOSIS — J301 Allergic rhinitis due to pollen: Secondary | ICD-10-CM | POA: Diagnosis not present

## 2018-04-03 ENCOUNTER — Other Ambulatory Visit: Payer: Self-pay | Admitting: Internal Medicine

## 2018-04-05 DIAGNOSIS — J3081 Allergic rhinitis due to animal (cat) (dog) hair and dander: Secondary | ICD-10-CM | POA: Diagnosis not present

## 2018-04-05 DIAGNOSIS — J3089 Other allergic rhinitis: Secondary | ICD-10-CM | POA: Diagnosis not present

## 2018-04-05 DIAGNOSIS — J301 Allergic rhinitis due to pollen: Secondary | ICD-10-CM | POA: Diagnosis not present

## 2018-04-06 ENCOUNTER — Ambulatory Visit (INDEPENDENT_AMBULATORY_CARE_PROVIDER_SITE_OTHER): Payer: Medicare HMO | Admitting: Family Medicine

## 2018-04-06 ENCOUNTER — Encounter: Payer: Self-pay | Admitting: Family Medicine

## 2018-04-06 VITALS — BP 132/80 | HR 74 | Temp 98.2°F | Ht 63.0 in | Wt 263.0 lb

## 2018-04-06 DIAGNOSIS — J069 Acute upper respiratory infection, unspecified: Secondary | ICD-10-CM

## 2018-04-06 DIAGNOSIS — J4530 Mild persistent asthma, uncomplicated: Secondary | ICD-10-CM

## 2018-04-06 MED ORDER — DOXYCYCLINE HYCLATE 100 MG PO TABS: 100.0000 mg | ORAL_TABLET | Freq: Two times a day (BID) | ORAL | 0 refills | Status: DC

## 2018-04-06 MED ORDER — PREDNISONE 10 MG PO TABS: ORAL_TABLET | ORAL | 0 refills | Status: DC

## 2018-04-06 MED ORDER — PROMETHAZINE-DM 6.25-15 MG/5ML PO SYRP: 5.0000 mL | ORAL_SOLUTION | Freq: Four times a day (QID) | ORAL | 0 refills | Status: DC | PRN

## 2018-04-06 MED ORDER — BENZONATATE 200 MG PO CAPS: 200.0000 mg | ORAL_CAPSULE | Freq: Three times a day (TID) | ORAL | 1 refills | Status: DC | PRN

## 2018-04-06 NOTE — Assessment & Plan Note (Signed)
And mild reactive airway  Cover with doxycycline and prednisone  Tessalon tid cough prometh-DM prn cough with caution of sedation  Disc symptomatic care - see instructions on AVS  Update if not starting to improve in a week or if worsening

## 2018-04-06 NOTE — Progress Notes (Signed)
Subjective:    Patient ID: Maria Franklin, female    DOB: July 31, 1961, 57 y.o.   MRN: [id number]  HPI  Here for cough and congestion  Former smoker  Hx of asthma and DM  Sick for 2 weeks   Voice is coming back  Throat is a little scratchy  Fever is better (was low grade)   Cough is not getting  Cannot sleep  More productive now - phlegm is green  Some wheezing -not bad   Nasal symptoms are improved  A little sinus headache and drainage (worse above the eyes)   otc :  Motrin , mucinex  Takes singulair Using albuterol mdi    Temp: 98.2 F (36.8 C)   Patient Active Problem List   Diagnosis Date Noted  . URI with cough and congestion 04/06/2018  . OSA (obstructive sleep apnea) 04/29/2016  . GERD (gastroesophageal reflux disease) 10/28/2015  . Asthma, mild persistent 04/21/2015  . Paranoid schizophrenia, chronic condition ([place]) 04/19/2014  . Seasonal allergies 04/19/2014  . DM2 (diabetes mellitus, type 2) ([place]) 04/19/2014  . Angina, [place] 12/30/2013  . Hypertension 01/10/2013  . Hyperlipidemia 01/10/2013  . Hypothyroidism 01/10/2013   Past Medical History:  Diagnosis Date  . Allergy   . Asthma   . Chicken pox   . Hyperlipidemia   . Hypertension   . Thyroid disease    Past Surgical History:  Procedure Laterality Date  . SINUS IRRIGATION    . TONSILLECTOMY     Social History   Tobacco Use  . Smoking status: Former Smoker    Packs/day: 0.25    Years: 6.00    Pack years: 1.50  . Smokeless tobacco: Never Used  . Tobacco comment: QUIT IN HER EARLY 20'S  Substance Use Topics  . Alcohol use: No    Alcohol/week: 0.0 oz  . Drug use: No   Family History  Problem Relation Age of Onset  . Mental illness Mother   . Hypertension Mother   . Schizophrenia Mother   . Depression Mother   . Cancer Father        LUNG  . Cancer Sister        BREAST  . Breast cancer Sister   . Mental illness Brother   . Hypertension Brother   . Schizophrenia Brother   .  Depression Brother   . Heart disease Maternal Grandmother   . Depression Maternal Grandmother   . Schizophrenia Maternal Grandmother   . Schizophrenia Brother   . Schizophrenia Maternal Uncle   . Breast cancer Maternal Aunt    Allergies  Allergen Reactions  . Pollen Extract Cough  . Latex     Severe itching   Current Outpatient Medications on File Prior to Visit  Medication Sig Dispense Refill  . albuterol (VENTOLIN HFA) 108 (90 Base) MCG/ACT inhaler INHALE ONE TO TWO PUFFS INTO LUNGS EVERY 6 HOURS AS NEEDED FOR WHEEZING OR SHORTNESS OF BREATH 18 each 2  . amLODipine (NORVASC) 10 MG tablet TAKE 1 TABLET BY MOUTH ONCE DAILY 90 tablet 2  . amLODipine (NORVASC) 10 MG tablet Take 1 tablet (10 mg total) by mouth daily. 90 tablet 1  . atorvastatin (LIPITOR) 10 MG tablet Take 1 tablet (10 mg total) by mouth daily at 6 PM. 90 tablet 0  . B Complex-C (B-COMPLEX WITH VITAMIN C) tablet Take 1 tablet by mouth daily. 90 tablet 0  . BIOTIN PO Take 1 tablet by mouth daily.    . carvedilol (COREG) 12.5  MG tablet TAKE 1 TABLET BY MOUTH TWICE DAILY WITH MEALS 180 tablet 2  . EPINEPHrine 0.3 mg/0.3 mL IJ SOAJ injection     . GLUCOSAMINE-CHONDROITIN DS PO Take 2 capsules by mouth daily.    . haloperidol (HALDOL) 5 MG tablet Take 0.5 tablets (2.5 mg total) by mouth daily. 30 tablet 0  . hydrALAZINE (APRESOLINE) 10 MG tablet Take 1 tablet (10 mg total) by mouth 2 (two) times daily. 180 tablet 1  . levocetirizine (XYZAL) 5 MG tablet     . levothyroxine (SYNTHROID, LEVOTHROID) 50 MCG tablet TAKE 1 TABLET BY MOUTH ONCE DAILY BEFORE BREAKFAST 90 tablet 2  . lisinopril (PRINIVIL,ZESTRIL) 40 MG tablet TAKE 1 TABLET BY MOUTH ONCE DAILY 90 tablet 1  . mometasone (NASONEX) 50 MCG/ACT nasal spray Place 2 sprays into the nose daily. 17 g 5  . montelukast (SINGULAIR) 10 MG tablet     . Multiple Vitamin (MULTIVITAMIN WITH MINERALS) TABS tablet Take 1 tablet by mouth daily.    . Omega-3 Fatty Acids (FISH OIL PO) Take 1  capsule by mouth daily.    Marland Kitchen omeprazole (PRILOSEC) 40 MG capsule Take 1 capsule (40 mg total) by mouth daily. 90 capsule 3  . OVER THE COUNTER MEDICATION Take 4 capsules by mouth daily. FOCUS FACTOR: MEMORY    . Probiotic Product (PROBIOTIC PO) Take 1 capsule by mouth daily.     No current facility-administered medications on file prior to visit.      Review of Systems  Constitutional: Positive for appetite change and fatigue. Negative for fever.  HENT: Positive for congestion, postnasal drip, rhinorrhea, sinus pressure, sneezing and sore throat. Negative for ear pain.   Eyes: Negative for pain and discharge.  Respiratory: Positive for cough. Negative for shortness of breath, wheezing and stridor.   Cardiovascular: Negative for chest pain.  Gastrointestinal: Negative for diarrhea, nausea and vomiting.  Genitourinary: Negative for frequency, hematuria and urgency.  Musculoskeletal: Negative for arthralgias and myalgias.  Skin: Negative for rash.  Neurological: Positive for headaches. Negative for dizziness, weakness and light-headedness.  Psychiatric/Behavioral: Negative for confusion and dysphoric mood.       Objective:   Physical Exam  Constitutional: She appears well-developed and well-nourished. No distress.  Well appearing   HENT:  Head: Normocephalic and atraumatic.  Right Ear: External ear normal.  Left Ear: External ear normal.  Mouth/Throat: Oropharynx is clear and moist.  Nares are injected and congested  No sinus tenderness Clear rhinorrhea and post nasal drip   Eyes: Pupils are equal, round, and reactive to light. Conjunctivae and EOM are normal. Right eye exhibits no discharge. Left eye exhibits no discharge.  Neck: Normal range of motion. Neck supple.  Cardiovascular: Normal rate and normal heart sounds.  Pulmonary/Chest: Effort normal. No respiratory distress. She has wheezes. She has no rales. She exhibits no tenderness.  Harsh bs occ scattered rhonchi and  wheezes  No rales  Good air exch overall   Lymphadenopathy:    She has no cervical adenopathy.  Neurological: She is alert.  Skin: Skin is warm and dry. No rash noted.  Psychiatric: She has a normal mood and affect.          Assessment & Plan:   Problem List Items Addressed This Visit      Respiratory   Asthma, mild persistent    With uri  Prednisone taper 30 mg  Disc poss side eff-aware       Relevant Medications   predniSONE (DELTASONE) 10 MG  tablet   URI with cough and congestion - Primary    And mild reactive airway  Cover with doxycycline and prednisone  Tessalon tid cough prometh-DM prn cough with caution of sedation  Disc symptomatic care - see instructions on AVS  Update if not starting to improve in a week or if worsening

## 2018-04-06 NOTE — Assessment & Plan Note (Signed)
With uri  Prednisone taper 30 mg  Disc poss side eff-aware

## 2018-04-06 NOTE — Patient Instructions (Signed)
Take the doxycycline for infection  Prednisone for wheezing, and albuterol inhaler as needed   As needed-tessalon and prometh DM for cough   Rest and drink fluids   Update if not starting to improve in a week or if worsening

## 2018-04-12 DIAGNOSIS — J3081 Allergic rhinitis due to animal (cat) (dog) hair and dander: Secondary | ICD-10-CM | POA: Diagnosis not present

## 2018-04-12 DIAGNOSIS — J301 Allergic rhinitis due to pollen: Secondary | ICD-10-CM | POA: Diagnosis not present

## 2018-04-12 DIAGNOSIS — J3089 Other allergic rhinitis: Secondary | ICD-10-CM | POA: Diagnosis not present

## 2018-04-13 ENCOUNTER — Encounter (HOSPITAL_COMMUNITY): Payer: Self-pay | Admitting: Emergency Medicine

## 2018-04-13 ENCOUNTER — Ambulatory Visit (HOSPITAL_COMMUNITY)
Admission: EM | Admit: 2018-04-13 | Discharge: 2018-04-13 | Disposition: A | Discharge status: Home / Self Care | Payer: Medicare HMO | Attending: Family Medicine | Admitting: Family Medicine

## 2018-04-13 DIAGNOSIS — R062 Wheezing: Secondary | ICD-10-CM

## 2018-04-13 DIAGNOSIS — J4551 Severe persistent asthma with (acute) exacerbation: Secondary | ICD-10-CM | POA: Diagnosis not present

## 2018-04-13 DIAGNOSIS — R0602 Shortness of breath: Secondary | ICD-10-CM | POA: Diagnosis not present

## 2018-04-13 MED ORDER — METHYLPREDNISOLONE ACETATE 80 MG/ML IJ SUSP
INTRAMUSCULAR | Status: AC
  Filled 2018-04-13: qty 1

## 2018-04-13 MED ORDER — METHYLPREDNISOLONE ACETATE 80 MG/ML IJ SUSP
80.0000 mg | Freq: Once | INTRAMUSCULAR | Status: AC
Start: 2018-04-13 — End: 2018-04-13
  Administered 2018-04-13: 80 mg via INTRAMUSCULAR

## 2018-04-13 MED ORDER — IPRATROPIUM-ALBUTEROL 0.5-2.5 (3) MG/3ML IN SOLN
3.0000 mL | Freq: Once | RESPIRATORY_TRACT | Status: AC
  Administered 2018-04-13: 3 mL via RESPIRATORY_TRACT

## 2018-04-13 MED ORDER — IPRATROPIUM-ALBUTEROL 0.5-2.5 (3) MG/3ML IN SOLN
RESPIRATORY_TRACT | Status: AC
  Filled 2018-04-13: qty 3

## 2018-04-13 NOTE — Discharge Instructions (Addendum)
If you cannot breathe well in the next hour or so, please go to the emergency room for more aggressive treatment.

## 2018-04-13 NOTE — ED Triage Notes (Signed)
Pt c/o wheezing, sob since last week, has seen her PCP and given antibiotics and steroids without relief, pt audibly wheezing.

## 2018-04-13 NOTE — ED Provider Notes (Signed)
[facility]   [id number] 04/13/18 Arrival Time: 1837   SUBJECTIVE:  Maria Franklin is a 57 y.o. female who presents to the urgent care with complaint of wheezing, sob for a month, has seen her PCP and given antibiotics and steroids without relief, pt audibly wheezing.   No vomiting.  No longer smoking.  H/O seasonal wheezing and asthma flares with colds.  Patient recently got money together to buy an Advair inhaler, having been out of it for long time.  Past Medical History:  Diagnosis Date  . Allergy   . Asthma   . Chicken pox   . Hyperlipidemia   . Hypertension   . Thyroid disease    Family History  Problem Relation Age of Onset  . Mental illness Mother   . Hypertension Mother   . Schizophrenia Mother   . Depression Mother   . Cancer Father        LUNG  . Cancer Sister        BREAST  . Breast cancer Sister   . Mental illness Brother   . Hypertension Brother   . Schizophrenia Brother   . Depression Brother   . Heart disease Maternal Grandmother   . Depression Maternal Grandmother   . Schizophrenia Maternal Grandmother   . Schizophrenia Brother   . Schizophrenia Maternal Uncle   . Breast cancer Maternal Aunt    Social History   Socioeconomic History  . Marital status: Married    Spouse name: Not on file  . Number of children: Not on file  . Years of education: Not on file  . Highest education level: Not on file  Occupational History  . Not on file  Social Needs  . Financial resource strain: Not on file  . Food insecurity:    Worry: Not on file    Inability: Not on file  . Transportation needs:    Medical: Not on file    Non-medical: Not on file  Tobacco Use  . Smoking status: Former Smoker    Packs/day: 0.25    Years: 6.00    Pack years: 1.50  . Smokeless tobacco: Never Used  . Tobacco comment: QUIT IN HER EARLY 20'S  Substance and Sexual Activity  . Alcohol use: No    Alcohol/week: 0.0 oz  . Drug use: No  . Sexual activity: Not  Currently  Lifestyle  . Physical activity:    Days per week: Not on file    Minutes per session: Not on file  . Stress: Not on file  Relationships  . Social connections:    Talks on phone: Not on file    Gets together: Not on file    Attends religious service: Not on file    Active member of club or organization: Not on file    Attends meetings of clubs or organizations: Not on file    Relationship status: Not on file  . Intimate partner violence:    Fear of current or ex partner: Not on file    Emotionally abused: Not on file    Physically abused: Not on file    Forced sexual activity: Not on file  Other Topics Concern  . Not on file  Social History Narrative   3 CHILDREN   RECENTLY MOVED TO [place]   No outpatient medications have been marked as taking for the 04/13/18 encounter [facility] Encounter).   Allergies  Allergen Reactions  . Pollen Extract Cough  . Latex  Severe itching      ROS: As per HPI, remainder of ROS negative.   OBJECTIVE:   Vitals:   04/13/18 1920  BP: (!) 144/89  Pulse: 84  Resp: (!) 26  Temp: 98.8 F (37.1 C)  SpO2: 97%     General appearance: alert; no distress Eyes: PERRL; EOMI; conjunctiva normal HENT: normocephalic; atraumatic;external ears normal without trauma; nasal mucosa normal; oral mucosa normal Neck: supple Lungs: wheezing on auscultation bilaterally Heart: regular rate and rhythm Back: no CVA tenderness Extremities: no cyanosis or edema; symmetrical with no gross deformities Skin: warm and dry Neurologic: normal gait; grossly normal Psychological: alert and cooperative; normal mood and affect      Labs:  Results for orders placed or performed in visit on 02/14/18  CBC  Result Value Ref Range   WBC 8.6 4.0 - 10.5 K/uL   RBC 4.18 3.87 - 5.11 Mil/uL   Platelets 285.0 150.0 - 400.0 K/uL   Hemoglobin 11.2 (L) 12.0 - 15.0 g/dL   HCT 34.7 (L) 36.0 - 46.0 %   MCV 83.1 78.0 - 100.0 fl    MCHC 32.3 30.0 - 36.0 g/dL   RDW 14.8 11.5 - 15.5 %  Comprehensive metabolic panel  Result Value Ref Range   Sodium 141 135 - 145 mEq/L   Potassium 4.0 3.5 - 5.1 mEq/L   Chloride 103 96 - 112 mEq/L   CO2 32 19 - 32 mEq/L   Glucose, Bld 112 (H) 70 - 99 mg/dL   BUN 15 6 - 23 mg/dL   Creatinine, Ser 1.04 0.40 - 1.20 mg/dL   Total Bilirubin 0.3 0.2 - 1.2 mg/dL   Alkaline Phosphatase 110 39 - 117 U/L   AST 18 0 - 37 U/L   ALT 20 0 - 35 U/L   Total Protein 7.5 6.0 - 8.3 g/dL   Albumin 4.1 3.5 - 5.2 g/dL   Calcium 9.6 8.4 - 10.5 mg/dL   GFR 70.32 >60.00 mL/min  TSH  Result Value Ref Range   TSH 2.99 0.35 - 4.50 uIU/mL  T4, free  Result Value Ref Range   Free T4 0.85 0.60 - 1.60 ng/dL  Lipid panel  Result Value Ref Range   Cholesterol 155 0 - 200 mg/dL   Triglycerides 84.0 0.0 - 149.0 mg/dL   HDL 66.30 >39.00 mg/dL   VLDL 16.8 0.0 - 40.0 mg/dL   LDL Cholesterol 72 0 - 99 mg/dL   Total CHOL/HDL Ratio 2    NonHDL 88.99   Hemoglobin A1c  Result Value Ref Range   Hgb A1c MFr Bld 6.6 (H) 4.6 - 6.5 %  VITAMIN D 25 Hydroxy (Vit-D Deficiency, Fractures)  Result Value Ref Range   VITD 27.88 (L) 30.00 - 100.00 ng/mL    Labs Reviewed - No data to display  No results found.     ASSESSMENT & PLAN:  1. Severe persistent asthma with exacerbation   If you cannot breathe well in the next hour or so, please go to the emergency room for more aggressive treatment.  Meds ordered this encounter  Medications  . methylPREDNISolone acetate (DEPO-MEDROL) injection 80 mg  . ipratropium-albuterol (DUONEB) 0.5-2.5 (3) MG/3ML nebulizer solution 3 mL    Reviewed expectations re: course of current medical issues. Questions answered. Outlined signs and symptoms indicating need for more acute intervention. Patient verbalized understanding. After Visit Summary given.    Procedures:      Robyn Haber, MD 04/13/18 2017

## 2018-04-20 ENCOUNTER — Ambulatory Visit (INDEPENDENT_AMBULATORY_CARE_PROVIDER_SITE_OTHER): Payer: Medicare HMO | Admitting: Internal Medicine

## 2018-04-20 ENCOUNTER — Encounter: Payer: Self-pay | Admitting: Internal Medicine

## 2018-04-20 VITALS — BP 142/90 | HR 90 | Temp 98.1°F | Wt 263.0 lb

## 2018-04-20 DIAGNOSIS — R0602 Shortness of breath: Secondary | ICD-10-CM | POA: Diagnosis not present

## 2018-04-20 DIAGNOSIS — R05 Cough: Secondary | ICD-10-CM

## 2018-04-20 DIAGNOSIS — R059 Cough, unspecified: Secondary | ICD-10-CM

## 2018-04-20 DIAGNOSIS — J4540 Moderate persistent asthma, uncomplicated: Secondary | ICD-10-CM

## 2018-04-20 NOTE — Progress Notes (Signed)
HPI  Pt presents to the clinic today with c/o persistent cough. The cough is non productive. She has shortness of breath, especially with coughing fits. She denies runny nose, nasal congestion, ear pain or sore throat. She denies fever, chills or body aches. She reports this started 5-6 weeks ago. She was seen 5/2 for the same, treated with Doxycycline and Prednisone. She went to [facility] 5/9, received a nebulizer treatment and steroid injection. She reports improvement in her symptoms but is conerned about the persistent cough. She has a history of asthma.   Review of Systems      Past Medical History:  Diagnosis Date  . Allergy   . Asthma   . Chicken pox   . Hyperlipidemia   . Hypertension   . Thyroid disease     Family History  Problem Relation Age of Onset  . Mental illness Mother   . Hypertension Mother   . Schizophrenia Mother   . Depression Mother   . Cancer Father        LUNG  . Cancer Sister        BREAST  . Breast cancer Sister   . Mental illness Brother   . Hypertension Brother   . Schizophrenia Brother   . Depression Brother   . Heart disease Maternal Grandmother   . Depression Maternal Grandmother   . Schizophrenia Maternal Grandmother   . Schizophrenia Brother   . Schizophrenia Maternal Uncle   . Breast cancer Maternal Aunt     Social History   Socioeconomic History  . Marital status: Married    Spouse name: Not on file  . Number of children: Not on file  . Years of education: Not on file  . Highest education level: Not on file  Occupational History  . Not on file  Social Needs  . Financial resource strain: Not on file  . Food insecurity:    Worry: Not on file    Inability: Not on file  . Transportation needs:    Medical: Not on file    Non-medical: Not on file  Tobacco Use  . Smoking status: Former Smoker    Packs/day: 0.25    Years: 6.00    Pack years: 1.50  . Smokeless tobacco: Never Used  . Tobacco comment: QUIT IN HER EARLY 20'S   Substance and Sexual Activity  . Alcohol use: No    Alcohol/week: 0.0 oz  . Drug use: No  . Sexual activity: Not Currently  Lifestyle  . Physical activity:    Days per week: Not on file    Minutes per session: Not on file  . Stress: Not on file  Relationships  . Social connections:    Talks on phone: Not on file    Gets together: Not on file    Attends religious service: Not on file    Active member of club or organization: Not on file    Attends meetings of clubs or organizations: Not on file    Relationship status: Not on file  . Intimate partner violence:    Fear of current or ex partner: Not on file    Emotionally abused: Not on file    Physically abused: Not on file    Forced sexual activity: Not on file  Other Topics Concern  . Not on file  Social History Narrative   3 CHILDREN   RECENTLY MOVED TO [place]   SEEKING EMPLOYMENT    Allergies  Allergen Reactions  . Pollen Extract  Cough  . Latex     Severe itching     Constitutional: Denies headache, fatigue, fever or abrupt weight changes.  HEENT:  Denies eye redness, eye pain, pressure behind the eyes, facial pain, nasal congestion, ear pain, ringing in the ears, wax buildup, runny nose or sore throat. Respiratory: Positive cough and shortness of breath. Denies difficulty breathing.  Cardiovascular: Denies chest pain, chest tightness, palpitations or swelling in the hands or feet.   No other specific complaints in a complete review of systems (except as listed in HPI above).  Objective:   BP (!) 142/90   Pulse 90   Temp 98.1 F (36.7 C) (Oral)   Wt 263 lb (119.3 kg)   SpO2 98%   BMI 46.59 kg/m   Wt Readings from Last 3 Encounters:  04/06/18 263 lb (119.3 kg)  02/14/18 263 lb (119.3 kg)  09/12/17 258 lb (117 kg)     General: Appears her stated age, obese in NAD. HEENT: Throat/Mouth:Teeth present, mucosa erythematous and moist, no exudate noted, no lesions or ulcerations noted.  Neck: No  cervical lymphadenopathy.  Cardiovascular: Normal rate and rhythm. S1,S2 noted.  No murmur, rubs or gallops noted.  Pulmonary/Chest: Normal effort and positive vesicular breath sounds. No respiratory distress. No wheezes, rales or ronchi noted.       Assessment & Plan:   Cough, Asthma:  Get some rest and drink plenty of water Likely post infectious Continue Advair and Albuterol Continue Tessalon and cough syrup No indication for repeat abx at this time No need for chest xray at this time  RTC as needed or if symptoms persist.   Webb Silversmith, NP

## 2018-04-20 NOTE — Patient Instructions (Signed)

## 2018-04-26 DIAGNOSIS — J3081 Allergic rhinitis due to animal (cat) (dog) hair and dander: Secondary | ICD-10-CM | POA: Diagnosis not present

## 2018-04-26 DIAGNOSIS — J3089 Other allergic rhinitis: Secondary | ICD-10-CM | POA: Diagnosis not present

## 2018-04-26 DIAGNOSIS — J301 Allergic rhinitis due to pollen: Secondary | ICD-10-CM | POA: Diagnosis not present

## 2018-05-02 ENCOUNTER — Encounter: Payer: Self-pay | Admitting: Internal Medicine

## 2018-05-03 DIAGNOSIS — J301 Allergic rhinitis due to pollen: Secondary | ICD-10-CM | POA: Diagnosis not present

## 2018-05-03 DIAGNOSIS — J3089 Other allergic rhinitis: Secondary | ICD-10-CM | POA: Diagnosis not present

## 2018-05-03 DIAGNOSIS — J3081 Allergic rhinitis due to animal (cat) (dog) hair and dander: Secondary | ICD-10-CM | POA: Diagnosis not present

## 2018-05-04 ENCOUNTER — Other Ambulatory Visit: Payer: Self-pay | Admitting: Internal Medicine

## 2018-05-10 DIAGNOSIS — J3081 Allergic rhinitis due to animal (cat) (dog) hair and dander: Secondary | ICD-10-CM | POA: Diagnosis not present

## 2018-05-10 DIAGNOSIS — J3089 Other allergic rhinitis: Secondary | ICD-10-CM | POA: Diagnosis not present

## 2018-05-10 DIAGNOSIS — J301 Allergic rhinitis due to pollen: Secondary | ICD-10-CM | POA: Diagnosis not present

## 2018-05-12 ENCOUNTER — Telehealth (HOSPITAL_COMMUNITY): Payer: Self-pay

## 2018-05-12 NOTE — Telephone Encounter (Signed)
Received a fax request from the pharmacy stating that Haloperidol 1mg  tab is on back order. Can this prescription be changed? Please advise

## 2018-05-15 ENCOUNTER — Other Ambulatory Visit (HOSPITAL_COMMUNITY): Payer: Self-pay | Admitting: Psychiatry

## 2018-05-15 ENCOUNTER — Other Ambulatory Visit (HOSPITAL_COMMUNITY): Payer: Self-pay

## 2018-05-15 DIAGNOSIS — F200 Paranoid schizophrenia: Secondary | ICD-10-CM

## 2018-05-15 MED ORDER — HALOPERIDOL 5 MG PO TABS: 2.5000 mg | ORAL_TABLET | Freq: Every day | ORAL | 0 refills | Status: DC

## 2018-05-15 NOTE — Telephone Encounter (Signed)
She can take Haldol 5 mg half tablet (2.5 mg) at bedtime.

## 2018-05-15 NOTE — Telephone Encounter (Signed)
Sent in refill to [facility] of Haldol 5mg  tab to be taken 1/2 tab at bedtime.

## 2018-05-15 NOTE — Telephone Encounter (Signed)
[place] and spoke with pharmacist and she stated that they have 35 tabs of the 1mg  and 100 plus tabs of the 5mg  Haloperidol. Please review and advise

## 2018-05-17 DIAGNOSIS — J3081 Allergic rhinitis due to animal (cat) (dog) hair and dander: Secondary | ICD-10-CM | POA: Diagnosis not present

## 2018-05-17 DIAGNOSIS — J3089 Other allergic rhinitis: Secondary | ICD-10-CM | POA: Diagnosis not present

## 2018-05-17 DIAGNOSIS — J301 Allergic rhinitis due to pollen: Secondary | ICD-10-CM | POA: Diagnosis not present

## 2018-05-18 ENCOUNTER — Ambulatory Visit (INDEPENDENT_AMBULATORY_CARE_PROVIDER_SITE_OTHER): Payer: Medicare HMO | Admitting: Psychiatry

## 2018-05-18 ENCOUNTER — Encounter (HOSPITAL_COMMUNITY): Payer: Self-pay | Admitting: Psychiatry

## 2018-05-18 DIAGNOSIS — Z638 Other specified problems related to primary support group: Secondary | ICD-10-CM | POA: Diagnosis not present

## 2018-05-18 DIAGNOSIS — Z87891 Personal history of nicotine dependence: Secondary | ICD-10-CM

## 2018-05-18 DIAGNOSIS — Z915 Personal history of self-harm: Secondary | ICD-10-CM | POA: Diagnosis not present

## 2018-05-18 DIAGNOSIS — F200 Paranoid schizophrenia: Secondary | ICD-10-CM

## 2018-05-18 DIAGNOSIS — Z818 Family history of other mental and behavioral disorders: Secondary | ICD-10-CM | POA: Diagnosis not present

## 2018-05-18 DIAGNOSIS — Z79899 Other long term (current) drug therapy: Secondary | ICD-10-CM

## 2018-05-18 DIAGNOSIS — R69 Illness, unspecified: Secondary | ICD-10-CM | POA: Diagnosis not present

## 2018-05-18 MED ORDER — HALOPERIDOL 2 MG PO TABS: 1.0000 mg | ORAL_TABLET | Freq: Every day | ORAL | 1 refills | Status: DC

## 2018-05-18 NOTE — Progress Notes (Signed)
[place] MD/PA/NP OP Progress Note  05/18/2018 8:39 AM Maria MICHELOTTI  MRN:  [id number]  Chief Complaint: I am doing so-so.  My daughters are upset with me.  HPI: [name] came for her follow-up appointment.  She has difficulty getting her Haldol.  Haldol 1 mg is backorder.  We recommended to try 5 mg and to take one fourth of the medication however some time pill does not broke as it supposed to.  She also having family issues.  She has 2 daughter and she used to have a babysitter her 57-month-old granddaughter who got upset with the patient and she is no longer doing babysitting for her 28-month-old granddaughter.  Her other daughter who moved in last Christmas with her 44-year-old autistic son also moved out because patient's husband was not happy with her.  Patient admitted that she missed her grandkids but also she thinks it is better for her because she has more time to travel and to do things.  She join [employer] and now she is taking classes and doing exercise almost every day.  Patient told in the beginning she was sad and tearful but now she is moving on.  She lives with her husband who is very supportive.  She admitted there are nights that she does not take CPAP because she feel that she is allergic to the mask.  She recently seen her primary care physician.  Her hemoglobin A1c improved from the past.  Patient denies any paranoia, hallucination, delusions, irritability, anger or any suicidal thoughts.  Her energy level is good.  She has no tremors, shakes or any EPS.    Visit Diagnosis:    ICD-10-CM   1. Paranoid schizophrenia, chronic condition ([place]) F20.0 haloperidol (HALDOL) 2 MG tablet    Past Psychiatric History: Reviewed. Patient has history of paranoia and delusions. She has one hospitalization at [facility]. She took overdose on her medication. She was noncompliant with medication and required admission 4 months later. She had tried Abilify with good response but she could not afford  it. Patient denies any history of physical sexual verbal and emotional abuse.  Past Medical History:  Past Medical History:  Diagnosis Date  . Allergy   . Asthma   . Chicken pox   . Hyperlipidemia   . Hypertension   . Thyroid disease     Past Surgical History:  Procedure Laterality Date  . SINUS IRRIGATION    . TONSILLECTOMY      Family Psychiatric History: Reviewed.  Family History:  Family History  Problem Relation Age of Onset  . Mental illness Mother   . Hypertension Mother   . Schizophrenia Mother   . Depression Mother   . Cancer Father        LUNG  . Cancer Sister        BREAST  . Breast cancer Sister   . Mental illness Brother   . Hypertension Brother   . Schizophrenia Brother   . Depression Brother   . Heart disease Maternal Grandmother   . Depression Maternal Grandmother   . Schizophrenia Maternal Grandmother   . Schizophrenia Brother   . Schizophrenia Maternal Uncle   . Breast cancer Maternal Aunt     Social History:  Social History   Socioeconomic History  . Marital status: Married    Spouse name: Not on file  . Number of children: Not on file  . Years of education: Not on file  . Highest education level: Not on file  Occupational History  . Not on file  Social Needs  . Financial resource strain: Not on file  . Food insecurity:    Worry: Not on file    Inability: Not on file  . Transportation needs:    Medical: Not on file    Non-medical: Not on file  Tobacco Use  . Smoking status: Former Smoker    Packs/day: 0.25    Years: 6.00    Pack years: 1.50  . Smokeless tobacco: Never Used  . Tobacco comment: QUIT IN HER EARLY 20'S  Substance and Sexual Activity  . Alcohol use: No    Alcohol/week: 0.0 oz  . Drug use: No  . Sexual activity: Not Currently  Lifestyle  . Physical activity:    Days per week: Not on file    Minutes per session: Not on file  . Stress: Not on file  Relationships  . Social connections:    Talks on phone: Not  on file    Gets together: Not on file    Attends religious service: Not on file    Active member of club or organization: Not on file    Attends meetings of clubs or organizations: Not on file    Relationship status: Not on file  Other Topics Concern  . Not on file  Social History Narrative   3 CHILDREN   RECENTLY MOVED TO [place]   SEEKING EMPLOYMENT    Allergies:  Allergies  Allergen Reactions  . Pollen Extract Cough  . Latex     Severe itching    Metabolic Disorder Labs: No results found for this or any previous visit (from the past 2160 hour(s)). Lab Results  Component Value Date   HGBA1C 6.6 (H) 02/14/2018   No results found for: PROLACTIN Lab Results  Component Value Date   CHOL 155 02/14/2018   TRIG 84.0 02/14/2018   HDL 66.30 02/14/2018   CHOLHDL 2 02/14/2018   VLDL 16.8 02/14/2018   LDLCALC 72 02/14/2018   LDLCALC 82 05/19/2017   Lab Results  Component Value Date   TSH 2.99 02/14/2018   TSH 2.39 05/19/2017    Therapeutic Level Labs: No results found for: LITHIUM No results found for: VALPROATE No components found for:  CBMZ  Current Medications: Current Outpatient Medications  Medication Sig Dispense Refill  . albuterol (VENTOLIN HFA) 108 (90 Base) MCG/ACT inhaler INHALE ONE TO TWO PUFFS INTO LUNGS EVERY 6 HOURS AS NEEDED FOR WHEEZING OR SHORTNESS OF BREATH 18 each 2  . amLODipine (NORVASC) 10 MG tablet Take 1 tablet (10 mg total) by mouth daily. 90 tablet 1  . atorvastatin (LIPITOR) 10 MG tablet TAKE 1 TABLET BY MOUTH ONCE DAILY AT  6  PM 90 tablet 3  . B Complex-C (B-COMPLEX WITH VITAMIN C) tablet Take 1 tablet by mouth daily. 90 tablet 0  . benzonatate (TESSALON) 200 MG capsule Take 1 capsule (200 mg total) by mouth 3 (three) times daily as needed. Do not bite pill 30 capsule 1  . BIOTIN PO Take 1 tablet by mouth daily.    . carvedilol (COREG) 12.5 MG tablet TAKE 1 TABLET BY MOUTH TWICE DAILY WITH MEALS 180 tablet 2  . EPINEPHrine 0.3  mg/0.3 mL IJ SOAJ injection     . GLUCOSAMINE-CHONDROITIN DS PO Take 2 capsules by mouth daily.    . haloperidol (HALDOL) 5 MG tablet Take 0.5 tablets (2.5 mg total) by mouth at bedtime. 30 tablet 0  . hydrALAZINE (APRESOLINE) 10 MG tablet Take  1 tablet (10 mg total) by mouth 2 (two) times daily. 180 tablet 1  . levocetirizine (XYZAL) 5 MG tablet     . levothyroxine (SYNTHROID, LEVOTHROID) 50 MCG tablet TAKE 1 TABLET BY MOUTH ONCE DAILY BEFORE BREAKFAST 90 tablet 2  . lisinopril (PRINIVIL,ZESTRIL) 40 MG tablet TAKE 1 TABLET BY MOUTH ONCE DAILY 90 tablet 1  . mometasone (NASONEX) 50 MCG/ACT nasal spray Place 2 sprays into the nose daily. 17 g 5  . montelukast (SINGULAIR) 10 MG tablet     . Multiple Vitamin (MULTIVITAMIN WITH MINERALS) TABS tablet Take 1 tablet by mouth daily.    . Omega-3 Fatty Acids (FISH OIL PO) Take 1 capsule by mouth daily.    Marland Kitchen omeprazole (PRILOSEC) 40 MG capsule Take 1 capsule (40 mg total) by mouth daily. 90 capsule 3  . OVER THE COUNTER MEDICATION Take 4 capsules by mouth daily. FOCUS FACTOR: MEMORY    . Probiotic Product (PROBIOTIC PO) Take 1 capsule by mouth daily.    . promethazine-dextromethorphan (PROMETHAZINE-DM) 6.25-15 MG/5ML syrup Take 5 mLs by mouth 4 (four) times daily as needed for cough. 118 mL 0   No current facility-administered medications for this visit.      Musculoskeletal: Strength & Muscle Tone: within normal limits Gait & Station: normal Patient leans: N/A  Psychiatric Specialty Exam: Review of Systems  Constitutional: Positive for weight loss.  Neurological: Negative for tremors.  Psychiatric/Behavioral: Negative for hallucinations.    Blood pressure (!) 141/78, pulse 76, height 5' 3.5" (1.613 m), weight 250 lb (113.4 kg).There is no height or weight on file to calculate BMI.  General Appearance: Casual  Eye Contact:  Good  Speech:  Slow  Volume:  Normal  Mood:  Dysphoric  Affect:  Appropriate  Thought Process:  Goal Directed   Orientation:  Full (Time, Place, and Person)  Thought Content: Rumination   Suicidal Thoughts:  No  Homicidal Thoughts:  No  Memory:  Immediate;   Good Recent;   Good Remote;   Good  Judgement:  Good  Insight:  Good  Psychomotor Activity:  Normal  Concentration:  Concentration: Good and Attention Span: Good  Recall:  Good  Fund of Knowledge: Good  Language: Good  Akathisia:  No  Handed:  Right  AIMS (if indicated): not done  Assets:  Communication Skills Desire for Improvement Housing Physical Health Resilience Social Support  ADL's:  Intact  Cognition: WNL  Sleep:  Good   Screenings: PHQ2-9     Office Visit from 02/14/2018 in [place] at [facility] Visit from 05/19/2017 in [place] at [place] Visit from 11/04/2016 in [place] at [place] Visit from 04/29/2016 in [place] at [facility] Visit from 10/28/2015 in [place] at [facility]  PHQ-2 Total Score  0  1  0  0  0  PHQ-9 Total Score  0  -  -  -  -       Assessment and Plan: Schizophrenia chronic paranoid type.  Discussed psychosocial issues.  Patient is stressed about her daughter but she is handling much better.  She started [facility] and able to distract her family stress very well.  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  I reviewed blood work results.  Her hemoglobin A1c improved from the past.  Encourage healthy lifestyle.  Patient lost weight from the past.  Patient is not interested in counseling.  We will try Haldol 2  mg if it is available and she can take half tablet every night.  I also suggested that if Haldol 2 mg is backorder then she we may switch to Haldol liquid 1 mg.  Recommended to call us back if she has any question, concern or if she feels worsening of the symptoms.  Follow-up in 6 months.   Kathlee Nations, MD 05/18/2018, 8:39 AM

## 2018-05-22 DIAGNOSIS — J3089 Other allergic rhinitis: Secondary | ICD-10-CM | POA: Diagnosis not present

## 2018-05-22 DIAGNOSIS — J301 Allergic rhinitis due to pollen: Secondary | ICD-10-CM | POA: Diagnosis not present

## 2018-05-22 DIAGNOSIS — J3081 Allergic rhinitis due to animal (cat) (dog) hair and dander: Secondary | ICD-10-CM | POA: Diagnosis not present

## 2018-05-24 DIAGNOSIS — J301 Allergic rhinitis due to pollen: Secondary | ICD-10-CM | POA: Diagnosis not present

## 2018-05-24 DIAGNOSIS — J3089 Other allergic rhinitis: Secondary | ICD-10-CM | POA: Diagnosis not present

## 2018-05-24 DIAGNOSIS — J3081 Allergic rhinitis due to animal (cat) (dog) hair and dander: Secondary | ICD-10-CM | POA: Diagnosis not present

## 2018-05-29 ENCOUNTER — Encounter: Payer: Self-pay | Admitting: Internal Medicine

## 2018-05-30 DIAGNOSIS — J301 Allergic rhinitis due to pollen: Secondary | ICD-10-CM | POA: Diagnosis not present

## 2018-05-30 DIAGNOSIS — J3089 Other allergic rhinitis: Secondary | ICD-10-CM | POA: Diagnosis not present

## 2018-05-30 DIAGNOSIS — J3081 Allergic rhinitis due to animal (cat) (dog) hair and dander: Secondary | ICD-10-CM | POA: Diagnosis not present

## 2018-06-01 ENCOUNTER — Other Ambulatory Visit (HOSPITAL_COMMUNITY): Payer: Self-pay

## 2018-06-01 MED ORDER — FLUPHENAZINE HCL 1 MG PO TABS: 1.0000 mg | ORAL_TABLET | Freq: Every day | ORAL | 0 refills | Status: DC

## 2018-06-07 DIAGNOSIS — J3081 Allergic rhinitis due to animal (cat) (dog) hair and dander: Secondary | ICD-10-CM | POA: Diagnosis not present

## 2018-06-07 DIAGNOSIS — J3089 Other allergic rhinitis: Secondary | ICD-10-CM | POA: Diagnosis not present

## 2018-06-07 DIAGNOSIS — J301 Allergic rhinitis due to pollen: Secondary | ICD-10-CM | POA: Diagnosis not present

## 2018-06-12 DIAGNOSIS — J3089 Other allergic rhinitis: Secondary | ICD-10-CM | POA: Diagnosis not present

## 2018-06-12 DIAGNOSIS — J301 Allergic rhinitis due to pollen: Secondary | ICD-10-CM | POA: Diagnosis not present

## 2018-06-12 DIAGNOSIS — J3081 Allergic rhinitis due to animal (cat) (dog) hair and dander: Secondary | ICD-10-CM | POA: Diagnosis not present

## 2018-06-13 ENCOUNTER — Other Ambulatory Visit: Payer: Self-pay | Admitting: Internal Medicine

## 2018-06-14 DIAGNOSIS — J301 Allergic rhinitis due to pollen: Secondary | ICD-10-CM | POA: Diagnosis not present

## 2018-06-14 DIAGNOSIS — J3089 Other allergic rhinitis: Secondary | ICD-10-CM | POA: Diagnosis not present

## 2018-06-14 DIAGNOSIS — J3081 Allergic rhinitis due to animal (cat) (dog) hair and dander: Secondary | ICD-10-CM | POA: Diagnosis not present

## 2018-06-19 DIAGNOSIS — J3081 Allergic rhinitis due to animal (cat) (dog) hair and dander: Secondary | ICD-10-CM | POA: Diagnosis not present

## 2018-06-19 DIAGNOSIS — J301 Allergic rhinitis due to pollen: Secondary | ICD-10-CM | POA: Diagnosis not present

## 2018-06-19 DIAGNOSIS — J3089 Other allergic rhinitis: Secondary | ICD-10-CM | POA: Diagnosis not present

## 2018-06-21 DIAGNOSIS — J3081 Allergic rhinitis due to animal (cat) (dog) hair and dander: Secondary | ICD-10-CM | POA: Diagnosis not present

## 2018-06-21 DIAGNOSIS — J3089 Other allergic rhinitis: Secondary | ICD-10-CM | POA: Diagnosis not present

## 2018-06-21 DIAGNOSIS — J301 Allergic rhinitis due to pollen: Secondary | ICD-10-CM | POA: Diagnosis not present

## 2018-06-26 DIAGNOSIS — J3089 Other allergic rhinitis: Secondary | ICD-10-CM | POA: Diagnosis not present

## 2018-06-26 DIAGNOSIS — J3081 Allergic rhinitis due to animal (cat) (dog) hair and dander: Secondary | ICD-10-CM | POA: Diagnosis not present

## 2018-06-26 DIAGNOSIS — R69 Illness, unspecified: Secondary | ICD-10-CM | POA: Diagnosis not present

## 2018-06-26 DIAGNOSIS — J301 Allergic rhinitis due to pollen: Secondary | ICD-10-CM | POA: Diagnosis not present

## 2018-06-29 DIAGNOSIS — J301 Allergic rhinitis due to pollen: Secondary | ICD-10-CM | POA: Diagnosis not present

## 2018-06-29 DIAGNOSIS — J3089 Other allergic rhinitis: Secondary | ICD-10-CM | POA: Diagnosis not present

## 2018-06-29 DIAGNOSIS — J3081 Allergic rhinitis due to animal (cat) (dog) hair and dander: Secondary | ICD-10-CM | POA: Diagnosis not present

## 2018-07-03 ENCOUNTER — Other Ambulatory Visit: Payer: Self-pay | Admitting: Internal Medicine

## 2018-07-03 DIAGNOSIS — K219 Gastro-esophageal reflux disease without esophagitis: Secondary | ICD-10-CM

## 2018-07-04 DIAGNOSIS — J3089 Other allergic rhinitis: Secondary | ICD-10-CM | POA: Diagnosis not present

## 2018-07-04 DIAGNOSIS — J3081 Allergic rhinitis due to animal (cat) (dog) hair and dander: Secondary | ICD-10-CM | POA: Diagnosis not present

## 2018-07-04 DIAGNOSIS — J301 Allergic rhinitis due to pollen: Secondary | ICD-10-CM | POA: Diagnosis not present

## 2018-07-06 DIAGNOSIS — J3081 Allergic rhinitis due to animal (cat) (dog) hair and dander: Secondary | ICD-10-CM | POA: Diagnosis not present

## 2018-07-06 DIAGNOSIS — J3089 Other allergic rhinitis: Secondary | ICD-10-CM | POA: Diagnosis not present

## 2018-07-06 DIAGNOSIS — J301 Allergic rhinitis due to pollen: Secondary | ICD-10-CM | POA: Diagnosis not present

## 2018-07-11 DIAGNOSIS — J3081 Allergic rhinitis due to animal (cat) (dog) hair and dander: Secondary | ICD-10-CM | POA: Diagnosis not present

## 2018-07-11 DIAGNOSIS — J3089 Other allergic rhinitis: Secondary | ICD-10-CM | POA: Diagnosis not present

## 2018-07-11 DIAGNOSIS — J301 Allergic rhinitis due to pollen: Secondary | ICD-10-CM | POA: Diagnosis not present

## 2018-07-13 DIAGNOSIS — J301 Allergic rhinitis due to pollen: Secondary | ICD-10-CM | POA: Diagnosis not present

## 2018-07-13 DIAGNOSIS — J3081 Allergic rhinitis due to animal (cat) (dog) hair and dander: Secondary | ICD-10-CM | POA: Diagnosis not present

## 2018-07-13 DIAGNOSIS — J3089 Other allergic rhinitis: Secondary | ICD-10-CM | POA: Diagnosis not present

## 2018-07-19 DIAGNOSIS — J3089 Other allergic rhinitis: Secondary | ICD-10-CM | POA: Diagnosis not present

## 2018-07-19 DIAGNOSIS — J301 Allergic rhinitis due to pollen: Secondary | ICD-10-CM | POA: Diagnosis not present

## 2018-07-19 DIAGNOSIS — J3081 Allergic rhinitis due to animal (cat) (dog) hair and dander: Secondary | ICD-10-CM | POA: Diagnosis not present

## 2018-07-21 DIAGNOSIS — J3081 Allergic rhinitis due to animal (cat) (dog) hair and dander: Secondary | ICD-10-CM | POA: Diagnosis not present

## 2018-07-21 DIAGNOSIS — J3089 Other allergic rhinitis: Secondary | ICD-10-CM | POA: Diagnosis not present

## 2018-07-21 DIAGNOSIS — J301 Allergic rhinitis due to pollen: Secondary | ICD-10-CM | POA: Diagnosis not present

## 2018-07-26 DIAGNOSIS — J3089 Other allergic rhinitis: Secondary | ICD-10-CM | POA: Diagnosis not present

## 2018-07-26 DIAGNOSIS — J301 Allergic rhinitis due to pollen: Secondary | ICD-10-CM | POA: Diagnosis not present

## 2018-07-26 DIAGNOSIS — J3081 Allergic rhinitis due to animal (cat) (dog) hair and dander: Secondary | ICD-10-CM | POA: Diagnosis not present

## 2018-07-29 ENCOUNTER — Other Ambulatory Visit (HOSPITAL_COMMUNITY): Payer: Self-pay

## 2018-07-29 DIAGNOSIS — F200 Paranoid schizophrenia: Secondary | ICD-10-CM

## 2018-07-29 MED ORDER — HALOPERIDOL 2 MG PO TABS: 1.0000 mg | ORAL_TABLET | Freq: Every day | ORAL | 1 refills | Status: DC

## 2018-08-01 DIAGNOSIS — R69 Illness, unspecified: Secondary | ICD-10-CM | POA: Diagnosis not present

## 2018-08-02 DIAGNOSIS — J3081 Allergic rhinitis due to animal (cat) (dog) hair and dander: Secondary | ICD-10-CM | POA: Diagnosis not present

## 2018-08-02 DIAGNOSIS — J3089 Other allergic rhinitis: Secondary | ICD-10-CM | POA: Diagnosis not present

## 2018-08-02 DIAGNOSIS — J301 Allergic rhinitis due to pollen: Secondary | ICD-10-CM | POA: Diagnosis not present

## 2018-08-03 DIAGNOSIS — E119 Type 2 diabetes mellitus without complications: Secondary | ICD-10-CM | POA: Diagnosis not present

## 2018-08-03 LAB — HM DIABETES EYE EXAM

## 2018-08-08 DIAGNOSIS — J301 Allergic rhinitis due to pollen: Secondary | ICD-10-CM | POA: Diagnosis not present

## 2018-08-08 DIAGNOSIS — J3089 Other allergic rhinitis: Secondary | ICD-10-CM | POA: Diagnosis not present

## 2018-08-08 DIAGNOSIS — J3081 Allergic rhinitis due to animal (cat) (dog) hair and dander: Secondary | ICD-10-CM | POA: Diagnosis not present

## 2018-08-11 ENCOUNTER — Other Ambulatory Visit: Payer: Self-pay

## 2018-08-11 DIAGNOSIS — K219 Gastro-esophageal reflux disease without esophagitis: Secondary | ICD-10-CM

## 2018-08-11 MED ORDER — LEVOTHYROXINE SODIUM 50 MCG PO TABS: ORAL_TABLET | ORAL | 1 refills | Status: DC

## 2018-08-11 MED ORDER — AMLODIPINE BESYLATE 10 MG PO TABS: 10.0000 mg | ORAL_TABLET | Freq: Every day | ORAL | 1 refills | Status: DC

## 2018-08-11 MED ORDER — CARVEDILOL 12.5 MG PO TABS: 12.5000 mg | ORAL_TABLET | Freq: Two times a day (BID) | ORAL | 1 refills | Status: DC

## 2018-08-11 MED ORDER — ATORVASTATIN CALCIUM 10 MG PO TABS: ORAL_TABLET | ORAL | 1 refills | Status: DC

## 2018-08-11 MED ORDER — OMEPRAZOLE 40 MG PO CPDR: 40.0000 mg | DELAYED_RELEASE_CAPSULE | Freq: Every day | ORAL | 1 refills | Status: DC

## 2018-08-11 MED ORDER — HYDRALAZINE HCL 10 MG PO TABS: 10.0000 mg | ORAL_TABLET | Freq: Two times a day (BID) | ORAL | 1 refills | Status: DC

## 2018-08-14 DIAGNOSIS — J4520 Mild intermittent asthma, uncomplicated: Secondary | ICD-10-CM | POA: Diagnosis not present

## 2018-08-14 DIAGNOSIS — J301 Allergic rhinitis due to pollen: Secondary | ICD-10-CM | POA: Diagnosis not present

## 2018-08-14 DIAGNOSIS — J3081 Allergic rhinitis due to animal (cat) (dog) hair and dander: Secondary | ICD-10-CM | POA: Diagnosis not present

## 2018-08-14 DIAGNOSIS — J3089 Other allergic rhinitis: Secondary | ICD-10-CM | POA: Diagnosis not present

## 2018-08-15 ENCOUNTER — Other Ambulatory Visit (HOSPITAL_COMMUNITY): Payer: Self-pay

## 2018-08-15 DIAGNOSIS — F200 Paranoid schizophrenia: Secondary | ICD-10-CM

## 2018-08-15 MED ORDER — HALOPERIDOL 5 MG PO TABS: 2.5000 mg | ORAL_TABLET | Freq: Every day | ORAL | 0 refills | Status: DC

## 2018-08-17 DIAGNOSIS — J3089 Other allergic rhinitis: Secondary | ICD-10-CM | POA: Diagnosis not present

## 2018-08-17 DIAGNOSIS — J301 Allergic rhinitis due to pollen: Secondary | ICD-10-CM | POA: Diagnosis not present

## 2018-08-17 DIAGNOSIS — J3081 Allergic rhinitis due to animal (cat) (dog) hair and dander: Secondary | ICD-10-CM | POA: Diagnosis not present

## 2018-08-23 DIAGNOSIS — J301 Allergic rhinitis due to pollen: Secondary | ICD-10-CM | POA: Diagnosis not present

## 2018-08-23 DIAGNOSIS — J3081 Allergic rhinitis due to animal (cat) (dog) hair and dander: Secondary | ICD-10-CM | POA: Diagnosis not present

## 2018-08-23 DIAGNOSIS — J3089 Other allergic rhinitis: Secondary | ICD-10-CM | POA: Diagnosis not present

## 2018-08-25 DIAGNOSIS — J3089 Other allergic rhinitis: Secondary | ICD-10-CM | POA: Diagnosis not present

## 2018-08-25 DIAGNOSIS — J301 Allergic rhinitis due to pollen: Secondary | ICD-10-CM | POA: Diagnosis not present

## 2018-08-25 DIAGNOSIS — J3081 Allergic rhinitis due to animal (cat) (dog) hair and dander: Secondary | ICD-10-CM | POA: Diagnosis not present

## 2018-08-28 DIAGNOSIS — J3081 Allergic rhinitis due to animal (cat) (dog) hair and dander: Secondary | ICD-10-CM | POA: Diagnosis not present

## 2018-08-28 DIAGNOSIS — J3089 Other allergic rhinitis: Secondary | ICD-10-CM | POA: Diagnosis not present

## 2018-08-28 DIAGNOSIS — J301 Allergic rhinitis due to pollen: Secondary | ICD-10-CM | POA: Diagnosis not present

## 2018-09-05 DIAGNOSIS — J301 Allergic rhinitis due to pollen: Secondary | ICD-10-CM | POA: Diagnosis not present

## 2018-09-05 DIAGNOSIS — J3089 Other allergic rhinitis: Secondary | ICD-10-CM | POA: Diagnosis not present

## 2018-09-05 DIAGNOSIS — J3081 Allergic rhinitis due to animal (cat) (dog) hair and dander: Secondary | ICD-10-CM | POA: Diagnosis not present

## 2018-09-08 DIAGNOSIS — J3089 Other allergic rhinitis: Secondary | ICD-10-CM | POA: Diagnosis not present

## 2018-09-08 DIAGNOSIS — J3081 Allergic rhinitis due to animal (cat) (dog) hair and dander: Secondary | ICD-10-CM | POA: Diagnosis not present

## 2018-09-08 DIAGNOSIS — J301 Allergic rhinitis due to pollen: Secondary | ICD-10-CM | POA: Diagnosis not present

## 2018-09-14 DIAGNOSIS — J3081 Allergic rhinitis due to animal (cat) (dog) hair and dander: Secondary | ICD-10-CM | POA: Diagnosis not present

## 2018-09-14 DIAGNOSIS — J301 Allergic rhinitis due to pollen: Secondary | ICD-10-CM | POA: Diagnosis not present

## 2018-09-14 DIAGNOSIS — J3089 Other allergic rhinitis: Secondary | ICD-10-CM | POA: Diagnosis not present

## 2018-09-20 DIAGNOSIS — J301 Allergic rhinitis due to pollen: Secondary | ICD-10-CM | POA: Diagnosis not present

## 2018-09-20 DIAGNOSIS — J3081 Allergic rhinitis due to animal (cat) (dog) hair and dander: Secondary | ICD-10-CM | POA: Diagnosis not present

## 2018-09-20 DIAGNOSIS — J3089 Other allergic rhinitis: Secondary | ICD-10-CM | POA: Diagnosis not present

## 2018-09-21 ENCOUNTER — Other Ambulatory Visit: Payer: Self-pay | Admitting: Internal Medicine

## 2018-09-21 DIAGNOSIS — Z1231 Encounter for screening mammogram for malignant neoplasm of breast: Secondary | ICD-10-CM

## 2018-09-27 DIAGNOSIS — J301 Allergic rhinitis due to pollen: Secondary | ICD-10-CM | POA: Diagnosis not present

## 2018-09-27 DIAGNOSIS — J3089 Other allergic rhinitis: Secondary | ICD-10-CM | POA: Diagnosis not present

## 2018-09-27 DIAGNOSIS — J3081 Allergic rhinitis due to animal (cat) (dog) hair and dander: Secondary | ICD-10-CM | POA: Diagnosis not present

## 2018-09-28 ENCOUNTER — Ambulatory Visit (INDEPENDENT_AMBULATORY_CARE_PROVIDER_SITE_OTHER): Payer: Medicare HMO | Admitting: Internal Medicine

## 2018-09-28 ENCOUNTER — Encounter: Payer: Self-pay | Admitting: Internal Medicine

## 2018-09-28 VITALS — BP 138/86 | HR 80 | Temp 98.2°F | Wt 251.0 lb

## 2018-09-28 DIAGNOSIS — R638 Other symptoms and signs concerning food and fluid intake: Secondary | ICD-10-CM | POA: Diagnosis not present

## 2018-09-28 DIAGNOSIS — E119 Type 2 diabetes mellitus without complications: Secondary | ICD-10-CM

## 2018-09-28 DIAGNOSIS — Z23 Encounter for immunization: Secondary | ICD-10-CM | POA: Diagnosis not present

## 2018-09-28 DIAGNOSIS — J339 Nasal polyp, unspecified: Secondary | ICD-10-CM

## 2018-09-28 NOTE — Progress Notes (Signed)
HPI  Pt presents to the clinic today with c/o nasal congestion. She reports this started 1 month ago. She is able to blow some clear mucous out of her nose. She is having trouble breathing out of her right nostril. She denies headache, facial pain or pressure, ear pain, runny nose, sore throat or cough. She denies fever, chills or body aches. She has tried Singulair, Flonase, Zyrtec and Sudafed with minimal relief. She has a history of nasal polyps and has had 2 nasal surgeries. She has not seen ENT in about 3 years. She reports if she does need to see another ENT, she would like to see Dr. Wilburn Cornelia.  She would also like a referral to an endocrinologist. She has a history of DM 2, last A1C was 6.6%. Her main concern is not her diabetes but she has been having difficulty losing weight. She has a family history of microprolactinemia and would like further evaluation of this and to see if this might be keeping her from losing weight.   Review of Systems     Past Medical History:  Diagnosis Date  . Allergy   . Asthma   . Chicken pox   . Hyperlipidemia   . Hypertension   . Thyroid disease     Family History  Problem Relation Age of Onset  . Mental illness Mother   . Hypertension Mother   . Schizophrenia Mother   . Depression Mother   . Cancer Father        LUNG  . Cancer Sister        BREAST  . Breast cancer Sister   . Mental illness Brother   . Hypertension Brother   . Schizophrenia Brother   . Depression Brother   . Heart disease Maternal Grandmother   . Depression Maternal Grandmother   . Schizophrenia Maternal Grandmother   . Schizophrenia Brother   . Schizophrenia Maternal Uncle   . Breast cancer Maternal Aunt     Social History   Socioeconomic History  . Marital status: Married    Spouse name: Not on file  . Number of children: Not on file  . Years of education: Not on file  . Highest education level: Not on file  Occupational History  . Not on file  Social Needs   . Financial resource strain: Not on file  . Food insecurity:    Worry: Not on file    Inability: Not on file  . Transportation needs:    Medical: Not on file    Non-medical: Not on file  Tobacco Use  . Smoking status: Former Smoker    Packs/day: 0.25    Years: 6.00    Pack years: 1.50  . Smokeless tobacco: Never Used  . Tobacco comment: QUIT IN HER EARLY 20'S  Substance and Sexual Activity  . Alcohol use: No    Alcohol/week: 0.0 standard drinks  . Drug use: No  . Sexual activity: Not Currently  Lifestyle  . Physical activity:    Days per week: Not on file    Minutes per session: Not on file  . Stress: Not on file  Relationships  . Social connections:    Talks on phone: Not on file    Gets together: Not on file    Attends religious service: Not on file    Active member of club or organization: Not on file    Attends meetings of clubs or organizations: Not on file    Relationship status: Not on file  .  Intimate partner violence:    Fear of current or ex partner: Not on file    Emotionally abused: Not on file    Physically abused: Not on file    Forced sexual activity: Not on file  Other Topics Concern  . Not on file  Social History Narrative   3 CHILDREN   RECENTLY MOVED TO [place]   SEEKING EMPLOYMENT    Allergies  Allergen Reactions  . Pollen Extract Cough  . Latex     Severe itching     Constitutional: Pt reports difficulty losing weight. Denies headache, fatigue, fever or abrupt weight changes.  HEENT:  Positive nasal congestion. Denies eye redness, eye pain, ear pain, ringing in the ears, wax buildup, runny nose or sore throat. Respiratory: Denies cough, difficulty breathing or shortness of breath.  Cardiovascular: Denies chest pain, chest tightness, palpitations or swelling in the hands or feet.   No other specific complaints in a complete review of systems (except as listed in HPI above).  Objective:   BP 138/86   Pulse 80   Temp 98.2 F  (36.8 C) (Oral)   Wt 251 lb (113.9 kg)   SpO2 97%   BMI 43.77 kg/m   General: Appears her stated age, obese, in NAD. HEENT: Head: normal shape and size, no sinus tenderness noted;  Nose: mucosa pink and moist, septum midline; Throat/Mouth: Teeth present, mucosa pink and moist, no exudate noted, no lesions or ulcerations noted.  Neck:  No adenopathy noted.        Assessment & Plan:   Nasal Polyps:  Stop Flonase, start Nasocort OTC Referral placed to ENT today  DM 2, Unable to Lose Weight:  Referral placed to Endocrinology, Dr. Hartford Poli per pt request  RTC as needed or if symptoms persist. Webb Silversmith, NP

## 2018-09-28 NOTE — Patient Instructions (Signed)
Nasal Polyps Nasal polyps are growths that form in the nose. Irritation and swelling (inflammation) in the nose or sinus openings can lead to changes in the tissue (mucosa) that lines these areas. Long-term inflammation causes the mucosa to balloon out or grow into a polyp. The polyp fills with watery mucus. Nasal polyps look like moist, gray grapes in the nose.Nasal polyps are not cancer. They do not increase your risk of cancer. You may have one nasal polyp or more than one. They can be small or large. In most cases, they form in both sides of the nose. Polyps can make it hard to breathe through your nose (nasal obstruction). What are the causes? The exact cause of nasal polyps is not known. What increases the risk? You are more likely to develop nasal polyps if you:  Have a family history of the condition.  Have a disease that causes inflammation in your nose or sinuses.  Have another condition that affects your nose or sinuses, such as: ? Nasal allergies (allergic rhinitis). ? Long-term nasal obstruction (nonallergic rhinitis). ? Asthma. ? Nasal or sinus infection, especially fungal infection.  Are female.  Are older than 57 years of age.  Have a sensitivity to aspirin or alcohol.  Smoke.  Have a disease passed down through families that causes increased production of thick mucus (cystic fibrosis).  What are the signs or symptoms? Symptoms depend on the size of the polyps. Small polyps may cause few symptoms. When symptoms develop, they may include:  Nasal obstruction.  Decreased senses of smell and taste.  Runny nose.  The feeling of mucus going down the back of the throat (postnasal drip).  Headache, face pain, or sinus pressure.  Snoring.  Frequent nasal or sinus infections.  Itchy eyes.  How is this diagnosed?  Nasal polyps may be diagnosed based on your symptoms, your medical history, and a physical exam of the inside of your nose. You may also have tests,  such as:  Imaging studies such as a CT scan or MRI to see how large your polyps are and if any are in your sinuses.  Skin or blood tests to find out if your polyps may be caused by allergies.  Washings or swabs taken from your nose to test for inflammation or infection.  How is this treated? Small nasal polyps that are not causing symptoms may not need treatment. For large polyps that are causing symptoms, the goal of treatment is to reduce nasal obstruction and improve sinus drainage. Treatment may include:  A medicine to reduce inflammation (steroid). This is usually the first treatment. You may have to take steroids for a short or long period of time. Short-term steroids are usually taken as pills. Long-term steroid treatment is usually in the form of nose drops or spray.  Medicines to treat an underlying condition, such as allergies, asthma, or infection.  Surgery. This may be needed to remove nasal polyps if medicine does not help.  Follow these instructions at home:  Take or use over-the-counter and prescription medicines only as told by your health care provider. Do not stop using your medicine even if you start to feel better.  Use solutions to wash or rinse out the inside of your nose (nasal washes or irrigations) as told by your health care provider.  Do not take medicines that contain aspirin if they make your symptoms worse.  Do not drink alcohol if it makes your symptoms worse.  Do not use any tobacco products, such  as cigarettes, chewing tobacco, and e-cigarettes. If you need help quitting, ask your health care provider.  Keep all follow-up visits as told by your health care provider. This is important. Contact a health care provider if:  Your condition does not get better or it gets worse at home after treatment.  You have a fever.  You have headaches or pain in your face that is new or is getting worse.  You have a bloody nose. This information is not intended  to replace advice given to you by your health care provider. Make sure you discuss any questions you have with your health care provider. Document Released: 03/15/2016 Document Revised: 04/29/2016 Document Reviewed: 02/12/2016 Elsevier Interactive Patient Education  2018 [employer].

## 2018-10-10 DIAGNOSIS — E039 Hypothyroidism, unspecified: Secondary | ICD-10-CM | POA: Diagnosis not present

## 2018-10-10 DIAGNOSIS — R7309 Other abnormal glucose: Secondary | ICD-10-CM | POA: Diagnosis not present

## 2018-10-10 DIAGNOSIS — R635 Abnormal weight gain: Secondary | ICD-10-CM | POA: Diagnosis not present

## 2018-10-16 DIAGNOSIS — J339 Nasal polyp, unspecified: Secondary | ICD-10-CM | POA: Diagnosis not present

## 2018-10-16 DIAGNOSIS — J309 Allergic rhinitis, unspecified: Secondary | ICD-10-CM | POA: Diagnosis not present

## 2018-10-24 ENCOUNTER — Encounter

## 2018-10-24 ENCOUNTER — Ambulatory Visit (HOSPITAL_COMMUNITY): Payer: Medicare HMO | Admitting: Psychiatry

## 2018-10-27 ENCOUNTER — Ambulatory Visit
Admission: RE | Admit: 2018-10-27 | Discharge: 2018-10-27 | Disposition: A | Discharge status: Home / Self Care | Payer: Medicare HMO | Source: Ambulatory Visit | Attending: Internal Medicine | Admitting: Internal Medicine

## 2018-10-27 DIAGNOSIS — Z1231 Encounter for screening mammogram for malignant neoplasm of breast: Secondary | ICD-10-CM | POA: Diagnosis not present

## 2018-11-07 ENCOUNTER — Ambulatory Visit: Payer: Medicare HMO | Admitting: Allergy & Immunology

## 2018-11-07 ENCOUNTER — Encounter: Payer: Self-pay | Admitting: Allergy & Immunology

## 2018-11-07 VITALS — BP 114/68 | HR 73 | Temp 97.4°F | Resp 18 | Ht 63.0 in | Wt 246.6 lb

## 2018-11-07 DIAGNOSIS — J4541 Moderate persistent asthma with (acute) exacerbation: Secondary | ICD-10-CM | POA: Diagnosis not present

## 2018-11-07 DIAGNOSIS — J3089 Other allergic rhinitis: Secondary | ICD-10-CM

## 2018-11-07 DIAGNOSIS — J339 Nasal polyp, unspecified: Secondary | ICD-10-CM

## 2018-11-07 DIAGNOSIS — J302 Other seasonal allergic rhinitis: Secondary | ICD-10-CM

## 2018-11-07 DIAGNOSIS — J4521 Mild intermittent asthma with (acute) exacerbation: Secondary | ICD-10-CM | POA: Diagnosis not present

## 2018-11-07 MED ORDER — BUDESONIDE-FORMOTEROL FUMARATE 160-4.5 MCG/ACT IN AERO: 2.0000 | INHALATION_SPRAY | Freq: Two times a day (BID) | RESPIRATORY_TRACT | 5 refills | Status: DC

## 2018-11-07 MED ORDER — AZELASTINE HCL 0.1 % NA SOLN: 2.0000 | Freq: Two times a day (BID) | NASAL | 2 refills | Status: DC

## 2018-11-07 NOTE — Patient Instructions (Addendum)
1. Seasonal and perennial allergic rhinitis - I think we need to hold off on allergy shots these seemed to be making things worse anyway. - Continue with the Flonase two sprays per nostril daily.  - Add on Astelin two sprays per nostril up to twice daily. - Continue with Allegra one tablet daily (can take an extra if you have a particularly bad day).  - Continue with Singulair (montelukast) 10mg  daily.   2. Moderate persistent asthma with acute exacerbation - Lung testing was around 60% normal, but it did improve with the nebulizer treatment. - Start the prednisone pack provided today.  - We are doing to send in Symbicort 160/4.5 to use two puffs twice daily. - Ruthe Mannan is not covered by your insurance (according to our computer system at least). - Call us and let us know how much it is.  - Spacer sample and demonstration provided. - Daily controller medication(s): Symbicort 160/4.72mcg two puffs twice daily with spacer - Prior to physical activity: albuterol 2 puffs 10-15 minutes before physical activity. - Rescue medications: albuterol 4 puffs every 4-6 hours as needed or albuterol nebulizer one vial every 4-6 hours as needed - Asthma control goals:  * Full participation in all desired activities (may need albuterol before activity) * Albuterol use two time or less a week on average (not counting use with activity) * Cough interfering with sleep two time or less a month * Oral steroids no more than once a year * No hospitalizations  3. Nasal polyposis - Information on Dupixent provided and Consent signed. - You should be hearing from [place] in the next week to discuss the process for approval and answer any more questions you may have about the drug. - This is a twice monthly medication and can be administered by you at home. - This can decrease the size of nasal polyps and help with nasal congestion.  4. Return in about 4 weeks (around 12/05/2018).   Please inform us of any Emergency  Department visits, hospitalizations, or changes in symptoms. Call us before going to the ED for breathing or allergy symptoms since we might be able to fit you in for a sick visit. Feel free to contact us anytime with any questions, problems, or concerns.  It was a pleasure to meet you today!  Websites that have reliable patient information: 1. American Academy of Asthma, Allergy, and Immunology: www.aaaai.org 2. Food Allergy Research and Education (FARE): foodallergy.org 3. Mothers of Asthmatics: http://www.asthmacommunitynetwork.org 4. American College of Allergy, Asthma, and Immunology: MonthlyElectricBill.co.uk   Make sure you are registered to vote! If you have moved or changed any of your contact information, you will need to get this updated before voting!

## 2018-11-07 NOTE — Progress Notes (Signed)
NEW PATIENT  Date of Service/Encounter:  11/07/18  Referring provider: Jearld Fenton, NP   Assessment:   Moderate persistent asthma with acute exacerbation  Seasonal and perennial allergic rhinitis (grasses, weeds, trees, indoor and outdoor molds, dust mite, roach)  Nasal polyposis - s/p two sinus surgeries (1990s and 2013)   [name] presents for establishment of care.  Today, her lung function is consistent with restrictive disease, but it improves markedly with the nebulizer treatment.  She does report improvement when she is on Advair, but she is unable to afford her medications unfortunately.  We did provide her with samples of Symbicort and a spacer to ensure adequate delivery of the inhaled medications to her lower lungs.  Symbicort is a tier 2 according to Epic, but she reports that it will still cost her $200 per refill.  We did provide her with a form for [place] [employer] [place], which hopefully should help her with affording her medications.  Her allergy injections did not seem to be helping her at all, and in fact seem to be making her break out in a rash.  She does not have pictures of this rash, but she notes that over the last month the rash has improved since she has been off her allergy shots.  She did not feel that they were helping her much at all anyway.  I think her biggest complaint at this point is her nasal congestion and inability to breathe through her nose, therefore the addition of Dupixent would be more efficacious for her rather than continuing her allergen immunotherapy.  We did give her information on to pick sent and hopefully she can receive free drug through the company.  We will have our Biologics Coordinator work on getting this approved.  This has the added benefit of hopefully helping her asthma as well.   Plan/Recommendations:   1. Seasonal and perennial allergic rhinitis - I think we need to hold off on allergy shots these seemed to be making things worse  anyway. - Continue with the Flonase two sprays per nostril daily.  - Add on Astelin two sprays per nostril up to twice daily. - Continue with Allegra one tablet daily (can take an extra if you have a particularly bad day).  - Continue with Singulair (montelukast) 10mg  daily.   2. Moderate persistent asthma with acute exacerbation - Lung testing was around 60% normal, but it did improve with the nebulizer treatment. - Start the prednisone pack provided today.  - We are going to start Dulera 200/5 two puffs twice daily to see if this is approved by Medicare. - Spacer sample and demonstration provided. - Daily controller medication(s): Dulera 200/13mcg two puffs twice daily with spacer - Prior to physical activity: albuterol 2 puffs 10-15 minutes before physical activity. - Rescue medications: albuterol 4 puffs every 4-6 hours as needed or albuterol nebulizer one vial every 4-6 hours as needed - Asthma control goals:  * Full participation in all desired activities (may need albuterol before activity) * Albuterol use two time or less a week on average (not counting use with activity) * Cough interfering with sleep two time or less a month * Oral steroids no more than once a year * No hospitalizations  3. Nasal polyposis - Information on Dupixent provided and Consent signed. - You should be hearing from Tammy in the next week to discuss the process for approval and answer any more questions you may have about the drug. - This is  a twice monthly medication and can be administered by you at home. - This can decrease the size of nasal polyps and help with nasal congestion.  4. Return in about 4 weeks (around 12/05/2018).   Subjective:   Maria Franklin is a 57 y.o. female presenting today for evaluation of  Chief Complaint  Patient presents with  . Establish Care    Immunotherapy   . Nasal Congestion  . Cough    Maria Franklin has a history of the following: Patient Active Problem  List   Diagnosis Date Noted  . OSA (obstructive sleep apnea) 04/29/2016  . GERD (gastroesophageal reflux disease) 10/28/2015  . Asthma, mild persistent 04/21/2015  . Paranoid schizophrenia, chronic condition ([facility]) 04/19/2014  . Seasonal allergies 04/19/2014  . DM2 (diabetes mellitus, type 2) ([facility]) 04/19/2014  . Angina, Ludwig 12/30/2013  . Hypertension 01/10/2013  . Hyperlipidemia 01/10/2013  . Hypothyroidism 01/10/2013    History obtained from: chart review and patient.  Maria Franklin was referred by Jearld Fenton, NP.     Maria Franklin is a 57 y.o. female presenting for establishment of care for her allergies and asthma. She has lived in [place] since 2013.  Asthma/Respiratory Symptom History: She has had asthma since she was very young. Most of this seems to be allergy mediated. She hospitalized often when she was a child. She is on Advair, but she stopped taking it since her insurance stopped paying for it. She pays $200 for one inhaler, so she is using it when she has flares. Evidently Medicare would not cover Symbicort either. She has not tried [place] or [place] to her knowledge. She does currently have a bad "chest cold" that does not seem to want to clear up after six weeks. However she denies any problems breathing.   Allergic Rhinitis Symptom History: She has had allergies for her enitre life. She was on allergy shots in the past and they have always been a problem. She was on allergy shots in 1990s for two years.  She was tested again in March 2019 and was started on allergy shots at [facility] with Dr. Donneta Romberg.  However, she wanted somewhere closer to her home. She does not feel that they are helping at all. She remains on Xyzal. She changed back to Zyrtec but it was not helping. She has now changed to [place] with improvement in her symptoms. This was changed around one month ago. She is on a nose spray (Flonase now but was on Nasonex). She was on azelastine in the  distant past but she does not remember whether it helped. She remains on montelukast.   Her last testing was performed in March 2019.  At that time, she was positive to [place] grass, fescue, orchard grass, Timothy grass, [place], weed mix, [place], tree mix, mulberry, pecan, walnuts, Alternaria, Aspergillus, Mucor, Bipolaris, Fusarium, Aureobasidium, Rhizopus, Penicillium, dust mite, cockroach, dog, cat, mixed feathers, and horse (results scanned into Epic).   In any case, she has not felt that the allergy shots have done much of anything over the past 8 months.  In fact, she reports that she has a rash and large local reactions with the allergy shots.  Her main complaint is certainly nasal congestion, although she does feel like she has occasional itchy watery eyes.  The Allegra has seemed to help with itchy watery eyes.  She has had two sinus surgeries, with the last one in 2013. This one was done in [place].  She reports that she had nasal polyps with a deviated septum. She also reports that she had "fungus" in her sinus. 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 She is scheduled for a sinus CT in a couple of weeks (Dr. Wilburn Cornelia).  She saw him for her initial visit in early November 2019.  At that time, she was given a Medrol Dosepak as well as Flonase nightly with nasal saline rinses.  She is going to follow-up in 1 month for a CT of the sinuses.  She estimates that she gets antibiotics around 2 times per year for sinus infections.  However, she is unable to breathe well through her nose.  Her polyps initially were worse in the left, but now have moved to the right per the patient.  Food Allergy Symptom History: She denies a history of food allergies, but for whatever reason she did have the most common foods tested in March 2019 that was completely negative.  Otherwise, there is no history of other atopic diseases, including drug allergies, stinging insect  allergies, eczema or urticaria. There is no significant infectious history. Vaccinations are up to date.    Past Medical History: Patient Active Problem List   Diagnosis Date Noted  . OSA (obstructive sleep apnea) 04/29/2016  . GERD (gastroesophageal reflux disease) 10/28/2015  . Asthma, mild persistent 04/21/2015  . Paranoid schizophrenia, chronic condition ([facility]) 04/19/2014  . Seasonal allergies 04/19/2014  . DM2 (diabetes mellitus, type 2) ([facility]) 04/19/2014  . Angina, Ludwig 12/30/2013  . Hypertension 01/10/2013  . Hyperlipidemia 01/10/2013  . Hypothyroidism 01/10/2013    Medication List:  Allergies as of 11/07/2018      Reactions   Pollen Extract Cough   Latex    Severe itching      Medication List        Accurate as of 11/07/18  3:19 PM. Always use your most recent med list.          albuterol 108 (90 Base) MCG/ACT inhaler Commonly known as:  PROVENTIL HFA;VENTOLIN HFA INHALE ONE TO TWO PUFFS INTO LUNGS EVERY 6 HOURS AS NEEDED FOR WHEEZING OR SHORTNESS OF BREATH   amLODipine 10 MG tablet Commonly known as:  NORVASC Take 1 tablet (10 mg total) by mouth daily.   atorvastatin 10 MG tablet Commonly known as:  LIPITOR TAKE 1 TABLET BY MOUTH ONCE DAILY AT  6  PM   BIOTIN PO Take 1 tablet by mouth daily.   carvedilol 12.5 MG tablet Commonly known as:  COREG Take 1 tablet (12.5 mg total) by mouth 2 (two) times daily with a meal.   EPINEPHrine 0.3 mg/0.3 mL Soaj injection Commonly known as:  EPI-PEN   fexofenadine 180 MG tablet Commonly known as:  ALLEGRA Take 180 mg by mouth daily.   FISH OIL PO Take 1 capsule by mouth daily.   fluticasone 50 MCG/ACT nasal spray Commonly known as:  FLONASE Place 2 sprays into both nostrils daily.   GLUCOSAMINE-CHONDROITIN DS PO Take 2 capsules by mouth daily.   haloperidol 5 MG tablet Commonly known as:  HALDOL Take 0.5 tablets (2.5 mg total) by mouth at bedtime.   hydrALAZINE 10 MG tablet Commonly known as:   APRESOLINE Take 1 tablet (10 mg total) by mouth 2 (two) times daily.   levothyroxine 50 MCG tablet Commonly known as:  SYNTHROID, LEVOTHROID TAKE 1 TABLET BY MOUTH ONCE DAILY BEFORE BREAKFAST   lisinopril 40 MG tablet Commonly known as:  PRINIVIL,ZESTRIL TAKE 1 TABLET BY  MOUTH ONCE DAILY   montelukast 10 MG tablet Commonly known as:  SINGULAIR   omeprazole 40 MG capsule Commonly known as:  PRILOSEC Take 1 capsule (40 mg total) by mouth daily.       Birth History: non-contributory  Developmental History: non-contributory.   Past Surgical History: Past Surgical History:  Procedure Laterality Date  . SINOSCOPY    . SINUS IRRIGATION    . TONSILLECTOMY       Family History: Family History  Problem Relation Age of Onset  . Mental illness Mother   . Hypertension Mother   . Schizophrenia Mother   . Depression Mother   . Cancer Father        LUNG  . Cancer Sister        BREAST  . Breast cancer Sister   . Mental illness Brother   . Hypertension Brother   . Schizophrenia Brother   . Depression Brother   . Heart disease Maternal Grandmother   . Depression Maternal Grandmother   . Schizophrenia Maternal Grandmother   . Schizophrenia Brother   . Schizophrenia Maternal Uncle   . Breast cancer Maternal Aunt      Social History: Parris lives in a house that is 57 years old. There are hardwoods throughout the home. There is gas heating and central cooling. There are no animals inside or outside of the home. There is no current smoking exposure.    Review of Systems: a 14-point review of systems is pertinent for what is mentioned in HPI.  Otherwise, all other systems were negative. Constitutional: negative other than that listed in the HPI Eyes: negative other than that listed in the HPI Ears, nose, mouth, throat, and face: negative other than that listed in the HPI Respiratory: negative other than that listed in the HPI Cardiovascular: negative other than that listed  in the HPI Gastrointestinal: negative other than that listed in the HPI Genitourinary: negative other than that listed in the HPI Integument: negative other than that listed in the HPI Hematologic: negative other than that listed in the HPI Musculoskeletal: negative other than that listed in the HPI Neurological: negative other than that listed in the HPI Allergy/Immunologic: negative other than that listed in the HPI    Objective:   Blood pressure 114/68, pulse 73, temperature (!) 97.4 F (36.3 C), temperature source Oral, resp. rate 18, height 5\' 3"  (1.6 m), weight 246 lb 9.6 oz (111.9 kg), SpO2 95 %. Body mass index is 43.68 kg/m.   Physical Exam:  General: Alert, interactive, in no acute distress. Pleasant and talkative.  Eyes: No conjunctival injection bilaterally, no discharge on the right, no discharge on the left and no Horner-Trantas dots present. PERRL bilaterally. EOMI without pain. No photophobia.  Ears: Right TM pearly gray with normal light reflex, Left TM pearly gray with normal light reflex, Right TM intact without perforation and Left TM intact without perforation.  Nose/Throat: External nose within normal limits, nasal crease present and septum midline. Turbinates markedly edematous and pale with clear discharge. Nasal polyposis present on the right with a possible polyp appreciated on the left. Posterior oropharynx erythematous with cobblestoning in the posterior oropharynx. Tonsils 2+ without exudates.  Tongue without thrush. Neck: Supple without thyromegaly. Trachea midline. Adenopathy: shoddy bilateral anterior cervical lymphadenopathy and no enlarged lymph nodes appreciated in the occipital, axillary, epitrochlear, inguinal, or popliteal regions. Lungs: Decreased breath sounds with expiratory wheezing bilaterally. No increased work of breathing. CV: Normal S1/S2. No murmurs. Capillary refill <2 seconds.  Abdomen: Nondistended, nontender. No guarding or rebound  tenderness. Bowel sounds present in all fields and hypoactive  Skin: Warm and dry, without lesions or rashes. Extremities:  No clubbing, cyanosis or edema. Neuro:   Grossly intact. No focal deficits appreciated. Responsive to questions.  Diagnostic studies:   Spirometry: results abnormal (FEV1: 1.29/62%, FVC: 1.72/66%, FEV1/FVC: 75%).    Spirometry consistent with possible restrictive disease. Xopenx and Atrovent nebulizer provided with a 20% improvement in her FVC and a 24% improvement in her FEV1.   Allergy Studies: none       Salvatore Marvel, MD [facility]

## 2018-11-11 ENCOUNTER — Other Ambulatory Visit (HOSPITAL_COMMUNITY): Payer: Self-pay | Admitting: Psychiatry

## 2018-11-11 DIAGNOSIS — F200 Paranoid schizophrenia: Secondary | ICD-10-CM

## 2018-11-15 DIAGNOSIS — R69 Illness, unspecified: Secondary | ICD-10-CM | POA: Diagnosis not present

## 2018-11-15 DIAGNOSIS — J3489 Other specified disorders of nose and nasal sinuses: Secondary | ICD-10-CM | POA: Diagnosis not present

## 2018-11-15 DIAGNOSIS — J309 Allergic rhinitis, unspecified: Secondary | ICD-10-CM | POA: Diagnosis not present

## 2018-11-15 DIAGNOSIS — J339 Nasal polyp, unspecified: Secondary | ICD-10-CM | POA: Diagnosis not present

## 2018-11-16 ENCOUNTER — Other Ambulatory Visit (HOSPITAL_COMMUNITY): Payer: Self-pay

## 2018-11-16 DIAGNOSIS — F200 Paranoid schizophrenia: Secondary | ICD-10-CM

## 2018-11-16 MED ORDER — HALOPERIDOL 5 MG PO TABS: 2.5000 mg | ORAL_TABLET | Freq: Every day | ORAL | 0 refills | Status: DC

## 2018-11-21 ENCOUNTER — Ambulatory Visit (HOSPITAL_COMMUNITY): Payer: Self-pay | Admitting: Psychiatry

## 2018-11-27 ENCOUNTER — Ambulatory Visit (INDEPENDENT_AMBULATORY_CARE_PROVIDER_SITE_OTHER): Payer: Medicare HMO | Admitting: Internal Medicine

## 2018-11-27 ENCOUNTER — Encounter: Payer: Self-pay | Admitting: Internal Medicine

## 2018-11-27 VITALS — BP 138/84 | HR 78 | Temp 97.9°F | Wt 249.0 lb

## 2018-11-27 DIAGNOSIS — J4540 Moderate persistent asthma, uncomplicated: Secondary | ICD-10-CM | POA: Diagnosis not present

## 2018-11-27 DIAGNOSIS — J339 Nasal polyp, unspecified: Secondary | ICD-10-CM

## 2018-11-27 DIAGNOSIS — R0981 Nasal congestion: Secondary | ICD-10-CM | POA: Diagnosis not present

## 2018-11-27 DIAGNOSIS — R413 Other amnesia: Secondary | ICD-10-CM

## 2018-11-27 DIAGNOSIS — R05 Cough: Secondary | ICD-10-CM

## 2018-11-27 DIAGNOSIS — R059 Cough, unspecified: Secondary | ICD-10-CM

## 2018-11-27 MED ORDER — FEXOFENADINE HCL 180 MG PO TABS: 180.0000 mg | ORAL_TABLET | Freq: Every day | ORAL | 2 refills | Status: DC

## 2018-11-27 NOTE — Progress Notes (Signed)
HPI  Pt presents to the clinic today with c/o nasal congestion and cough. She reports this started 1-2 months ago. She is not able to blow anything out of her nose. The cough is productive of yellow/green/gray mucous. She denies runny nose, ear pain, sore throat or shortness of breath. She denies fever, chills or body aches. She saw pulmonology 1 month ago for the same. She was put on Prednisone. She saw ENT for the same 2 weeks ago. They told her that she has recurrent sinus polyps and will need another surgery. She was placed on Prednisone again at that time. She has had 2 previous sinus surgery. She has has history of asthma as well.  She also wants a referral to neurology for evaluation of memory loss. She has trouble with short term memory and recall. She reports she can not remember stuff she used to know like multiplication facts. She reports she can't remember people's name. There is no CT scan of head or MRI brain on file for review.  Review of Systems      Past Medical History:  Diagnosis Date  . Allergy   . Angio-edema   . Asthma   . Chicken pox   . Hyperlipidemia   . Hypertension   . Recurrent upper respiratory infection (URI)   . Thyroid disease   . Urticaria     Family History  Problem Relation Age of Onset  . Mental illness Mother   . Hypertension Mother   . Schizophrenia Mother   . Depression Mother   . Cancer Father        LUNG  . Cancer Sister        BREAST  . Breast cancer Sister   . Mental illness Brother   . Hypertension Brother   . Schizophrenia Brother   . Depression Brother   . Heart disease Maternal Grandmother   . Depression Maternal Grandmother   . Schizophrenia Maternal Grandmother   . Schizophrenia Brother   . Schizophrenia Maternal Uncle   . Breast cancer Maternal Aunt     Social History   Socioeconomic History  . Marital status: Married    Spouse name: Not on file  . Number of children: Not on file  . Years of education: Not on file   . Highest education level: Not on file  Occupational History  . Not on file  Social Needs  . Financial resource strain: Not on file  . Food insecurity:    Worry: Not on file    Inability: Not on file  . Transportation needs:    Medical: Not on file    Non-medical: Not on file  Tobacco Use  . Smoking status: Former Smoker    Packs/day: 0.25    Years: 6.00    Pack years: 1.50  . Smokeless tobacco: Never Used  . Tobacco comment: QUIT IN HER EARLY 20'S  Substance and Sexual Activity  . Alcohol use: No    Alcohol/week: 0.0 standard drinks  . Drug use: No  . Sexual activity: Not Currently  Lifestyle  . Physical activity:    Days per week: Not on file    Minutes per session: Not on file  . Stress: Not on file  Relationships  . Social connections:    Talks on phone: Not on file    Gets together: Not on file    Attends religious service: Not on file    Active member of club or organization: Not on file  Attends meetings of clubs or organizations: Not on file    Relationship status: Not on file  . Intimate partner violence:    Fear of current or ex partner: Not on file    Emotionally abused: Not on file    Physically abused: Not on file    Forced sexual activity: Not on file  Other Topics Concern  . Not on file  Social History Narrative   3 CHILDREN   RECENTLY MOVED TO [place]   SEEKING EMPLOYMENT    Allergies  Allergen Reactions  . Pollen Extract Cough  . Latex     Severe itching     Constitutional:  Denies headache, fatigue, fever or abrupt weight changes.  HEENT:  Positive nasal congestion. Denies eye redness, eye pain, pressure behind the eyes, facial pain, ear pain, ringing in the ears, wax buildup, runny nose or sore throat. Respiratory: Positive cough. Denies difficulty breathing or shortness of breath.  Cardiovascular: Denies chest pain, chest tightness, palpitations or swelling in the hands or feet. Neuro: Pt reports issues with memory.   No  other specific complaints in a complete review of systems (except as listed in HPI above).  Objective:   BP 138/84   Pulse 78   Temp 97.9 F (36.6 C) (Oral)   Wt 249 lb (112.9 kg)   SpO2 97%   BMI 44.11 kg/m   Wt Readings from Last 3 Encounters:  11/07/18 246 lb 9.6 oz (111.9 kg)  09/28/18 251 lb (113.9 kg)  04/20/18 263 lb (119.3 kg)     General: Appears his stated age, well developed, well nourished in NAD. HEENT: Head: normal shape and size; Eyes: sclera white, no icterus, conjunctiva pink; Ears: Tm's gray and intact, normal light reflex; Nose: mucosa pink and moist, septum midline; Throat/Mouth: + PND. Teeth present, mucosa erythematous and moist, no exudate noted, no lesions or ulcerations noted.  Neck: No cervical lymphadenopathy.  Cardiovascular: Normal rate and rhythm. S1,S2 noted.  No murmur, rubs or gallops noted.  Pulmonary/Chest: Normal effort and positive vesicular breath sounds. No respiratory distress. No wheezes, rales or ronchi noted.  Neuro: Alert and oriented.     Assessment & Plan:   Nasal Congestion, Cough, Sinus Polpys:  No indication for abx or pred taper at this time Continue Singulair, Allegra, Advair, Flonase Add Claritin in am Follow up with asthma and allergy if symptoms persist  MCI:  MMSE 27/30 Will obtain MRI brain   RTC as needed or if symptoms persist.   Webb Silversmith, NP

## 2018-11-27 NOTE — Patient Instructions (Signed)

## 2018-12-01 ENCOUNTER — Telehealth: Payer: Self-pay | Admitting: Internal Medicine

## 2018-12-01 DIAGNOSIS — R413 Other amnesia: Secondary | ICD-10-CM

## 2018-12-01 NOTE — Telephone Encounter (Signed)
Received a call from [facility] that the MRI was denied.I called the patient and told her. She asked me to send you a message asking for a Referral to [facility]. Please place Referral to [facility].

## 2018-12-02 ENCOUNTER — Other Ambulatory Visit: Payer: Self-pay | Admitting: Internal Medicine

## 2018-12-02 NOTE — Telephone Encounter (Signed)
Referral to neurology placed.

## 2018-12-02 NOTE — Addendum Note (Signed)
Addended by: Jearld Fenton on: 12/02/2018 02:24 PM   Modules accepted: Orders

## 2018-12-04 ENCOUNTER — Emergency Department (HOSPITAL_COMMUNITY)
Admission: EM | Admit: 2018-12-04 | Discharge: 2018-12-05 | Disposition: A | Discharge status: Home / Self Care | Payer: Medicare HMO | Attending: Emergency Medicine | Admitting: Emergency Medicine

## 2018-12-04 ENCOUNTER — Encounter (HOSPITAL_COMMUNITY): Payer: Self-pay | Admitting: Emergency Medicine

## 2018-12-04 ENCOUNTER — Other Ambulatory Visit: Payer: Self-pay

## 2018-12-04 ENCOUNTER — Encounter: Payer: Self-pay | Admitting: Neurology

## 2018-12-04 ENCOUNTER — Emergency Department (HOSPITAL_COMMUNITY): Payer: Medicare HMO

## 2018-12-04 ENCOUNTER — Other Ambulatory Visit: Payer: Self-pay | Admitting: Otolaryngology

## 2018-12-04 DIAGNOSIS — I10 Essential (primary) hypertension: Secondary | ICD-10-CM | POA: Diagnosis not present

## 2018-12-04 DIAGNOSIS — R0602 Shortness of breath: Secondary | ICD-10-CM | POA: Diagnosis not present

## 2018-12-04 DIAGNOSIS — E039 Hypothyroidism, unspecified: Secondary | ICD-10-CM | POA: Insufficient documentation

## 2018-12-04 DIAGNOSIS — Z91040 Latex allergy status: Secondary | ICD-10-CM | POA: Diagnosis not present

## 2018-12-04 DIAGNOSIS — J4541 Moderate persistent asthma with (acute) exacerbation: Secondary | ICD-10-CM | POA: Diagnosis not present

## 2018-12-04 DIAGNOSIS — Z87891 Personal history of nicotine dependence: Secondary | ICD-10-CM | POA: Diagnosis not present

## 2018-12-04 DIAGNOSIS — Z79899 Other long term (current) drug therapy: Secondary | ICD-10-CM | POA: Diagnosis not present

## 2018-12-04 DIAGNOSIS — R0789 Other chest pain: Secondary | ICD-10-CM | POA: Diagnosis not present

## 2018-12-04 MED ORDER — ALBUTEROL SULFATE (2.5 MG/3ML) 0.083% IN NEBU
5.0000 mg | INHALATION_SOLUTION | Freq: Once | RESPIRATORY_TRACT | Status: AC
  Administered 2018-12-04: 5 mg via RESPIRATORY_TRACT
  Filled 2018-12-04: qty 6

## 2018-12-04 NOTE — ED Triage Notes (Signed)
Patient is complaining of sob, headaches, and a cough. Patient says this started on Saturday. Patient took motrin before she came tonight.

## 2018-12-04 NOTE — ED Notes (Signed)
Pt ambulated to x-ray and back to triage with no assist. Tolerated well.

## 2018-12-05 DIAGNOSIS — J4541 Moderate persistent asthma with (acute) exacerbation: Secondary | ICD-10-CM | POA: Diagnosis not present

## 2018-12-05 DIAGNOSIS — Z91040 Latex allergy status: Secondary | ICD-10-CM | POA: Diagnosis not present

## 2018-12-05 DIAGNOSIS — Z79899 Other long term (current) drug therapy: Secondary | ICD-10-CM | POA: Diagnosis not present

## 2018-12-05 DIAGNOSIS — I10 Essential (primary) hypertension: Secondary | ICD-10-CM | POA: Diagnosis not present

## 2018-12-05 DIAGNOSIS — Z87891 Personal history of nicotine dependence: Secondary | ICD-10-CM | POA: Diagnosis not present

## 2018-12-05 DIAGNOSIS — E039 Hypothyroidism, unspecified: Secondary | ICD-10-CM | POA: Diagnosis not present

## 2018-12-05 MED ORDER — METHYLPREDNISOLONE 4 MG PO TBPK: ORAL_TABLET | ORAL | 0 refills | Status: DC

## 2018-12-05 MED ORDER — IPRATROPIUM-ALBUTEROL 0.5-2.5 (3) MG/3ML IN SOLN
3.0000 mL | Freq: Once | RESPIRATORY_TRACT | Status: AC
  Administered 2018-12-05: 3 mL via RESPIRATORY_TRACT
  Filled 2018-12-05: qty 3

## 2018-12-05 MED ORDER — BECLOMETHASONE DIPROP HFA 40 MCG/ACT IN AERB: 1.0000 | INHALATION_SPRAY | Freq: Two times a day (BID) | RESPIRATORY_TRACT | 0 refills | Status: DC

## 2018-12-05 NOTE — ED Notes (Signed)
Pt states she has been taking motrin and took one before arrival

## 2018-12-05 NOTE — ED Provider Notes (Signed)
[facility] DEPT Provider Note   CSN: [id number] Arrival date & time: 12/04/18  2121     History   Chief Complaint Chief Complaint  Patient presents with  . Shortness of Breath    HPI Maria Franklin is a 57 y.o. female.  With a past medical history of moderate persistent asthma.  She has had URI symptoms recently and complains today of significant wheezing.  She received a DuoNeb prior to my evaluation and has improved however she continues to have some chest tightness and wheezing along with paroxysms of severe coughing.  She denies posttussive emesis.  No fevers or chills.  She has been using her albuterol inhaler at home.  Patient also takes Singulair and Advair.  She says that she has had several rounds of prednisone burst lately.  Patient is a non-smoker.  She denies dyspnea on exertion, orthopnea PND or peripheral edema HPI  Past Medical History:  Diagnosis Date  . Allergy   . Angio-edema   . Asthma   . Chicken pox   . Hyperlipidemia   . Hypertension   . Recurrent upper respiratory infection (URI)   . Thyroid disease   . Urticaria     Patient Active Problem List   Diagnosis Date Noted  . OSA (obstructive sleep apnea) 04/29/2016  . GERD (gastroesophageal reflux disease) 10/28/2015  . Asthma, mild persistent 04/21/2015  . Paranoid schizophrenia, chronic condition ([place]) 04/19/2014  . Seasonal allergies 04/19/2014  . DM2 (diabetes mellitus, type 2) ([place]) 04/19/2014  . Angina, Ludwig 12/30/2013  . Hypertension 01/10/2013  . Hyperlipidemia 01/10/2013  . Hypothyroidism 01/10/2013    Past Surgical History:  Procedure Laterality Date  . SINOSCOPY    . SINUS IRRIGATION    . TONSILLECTOMY       OB History   No obstetric history on file.      Home Medications    Prior to Admission medications   Medication Sig Start Date End Date Taking? Authorizing Provider  albuterol (VENTOLIN HFA) 108 (90 Base) MCG/ACT inhaler INHALE ONE TO  TWO PUFFS INTO LUNGS EVERY 6 HOURS AS NEEDED FOR WHEEZING OR SHORTNESS OF BREATH 01/11/18   Jearld Fenton, NP  amLODipine (NORVASC) 10 MG tablet Take 1 tablet (10 mg total) by mouth daily. 08/11/18   Jearld Fenton, NP  atorvastatin (LIPITOR) 10 MG tablet TAKE 1 TABLET BY MOUTH ONCE DAILY AT  6  PM 08/11/18   Baity, Coralie Keens, NP  azelastine (ASTELIN) 0.1 % nasal spray Place 2 sprays into both nostrils 2 (two) times daily. 11/07/18   Valentina Shaggy, MD  BIOTIN PO Take 1 tablet by mouth daily.    [provider]  budesonide-formoterol (SYMBICORT) 160-4.5 MCG/ACT inhaler Inhale 2 puffs into the lungs 2 (two) times daily. 11/07/18   Valentina Shaggy, MD  carvedilol (COREG) 12.5 MG tablet Take 1 tablet (12.5 mg total) by mouth 2 (two) times daily with a meal. 08/11/18   Jearld Fenton, NP  EPINEPHrine 0.3 mg/0.3 mL IJ SOAJ injection  02/07/18   [provider]  fexofenadine (ALLEGRA) 180 MG tablet Take 1 tablet (180 mg total) by mouth daily. 11/27/18   Jearld Fenton, NP  fluticasone (FLONASE) 50 MCG/ACT nasal spray Place 2 sprays into both nostrils daily.    [provider]  GLUCOSAMINE-CHONDROITIN DS PO Take 2 capsules by mouth daily.    [provider]  haloperidol (HALDOL) 5 MG tablet Take 0.5 tablets (2.5 mg total) by  mouth at bedtime. 11/16/18 11/16/19  Hampton Abbot, MD  hydrALAZINE (APRESOLINE) 10 MG tablet Take 1 tablet (10 mg total) by mouth 2 (two) times daily. 08/11/18   Jearld Fenton, NP  levothyroxine (SYNTHROID, LEVOTHROID) 50 MCG tablet TAKE 1 TABLET BY MOUTH ONCE DAILY BEFORE BREAKFAST 08/11/18   Jearld Fenton, NP  lisinopril (PRINIVIL,ZESTRIL) 40 MG tablet TAKE 1 TABLET BY MOUTH ONCE DAILY 12/04/18   Jearld Fenton, NP  metFORMIN (GLUCOPHAGE-XR) 500 MG 24 hr tablet Take 1,000 mg by mouth daily. 10/10/18   [provider]  montelukast (SINGULAIR) 10 MG tablet  02/07/18   [provider]  Omega-3 Fatty Acids (FISH OIL PO) Take 1  capsule by mouth daily.    [provider]  omeprazole (PRILOSEC) 40 MG capsule Take 1 capsule (40 mg total) by mouth daily. 08/11/18   Jearld Fenton, NP    Family History Family History  Problem Relation Age of Onset  . Mental illness Mother   . Hypertension Mother   . Schizophrenia Mother   . Depression Mother   . Cancer Father        LUNG  . Cancer Sister        BREAST  . Breast cancer Sister   . Mental illness Brother   . Hypertension Brother   . Schizophrenia Brother   . Depression Brother   . Heart disease Maternal Grandmother   . Depression Maternal Grandmother   . Schizophrenia Maternal Grandmother   . Schizophrenia Brother   . Schizophrenia Maternal Uncle   . Breast cancer Maternal Aunt     Social History Social History   Tobacco Use  . Smoking status: Former Smoker    Packs/day: 0.25    Years: 6.00    Pack years: 1.50  . Smokeless tobacco: Never Used  . Tobacco comment: QUIT IN HER EARLY 20'S  Substance Use Topics  . Alcohol use: No    Alcohol/week: 0.0 standard drinks  . Drug use: No     Allergies   Bee pollen; Pollen extract; and Latex   Review of Systems Review of Systems Ten systems reviewed and are negative for acute change, except as noted in the HPI.    Physical Exam Updated Vital Signs BP (!) 143/75 (BP Location: Left Arm)   Pulse 85   Temp 97.8 F (36.6 C) (Oral)   Resp 15   Ht 5\' 3"  (1.6 m)   Wt 110.7 kg   LMP 02/02/2017 (Approximate)   SpO2 96%   BMI 43.22 kg/m   Physical Exam Vitals signs and nursing note reviewed.  Constitutional:      General: She is not in acute distress.    Appearance: She is well-developed. She is not diaphoretic.  HENT:     Head: Normocephalic and atraumatic.  Eyes:     General: No scleral icterus.    Conjunctiva/sclera: Conjunctivae normal.  Neck:     Musculoskeletal: Normal range of motion.  Cardiovascular:     Rate and Rhythm: Normal rate and regular rhythm.     Heart sounds:  Normal heart sounds. No murmur. No friction rub. No gallop.   Pulmonary:     Effort: Pulmonary effort is normal. No respiratory distress.     Breath sounds: Normal breath sounds.  Abdominal:     General: Bowel sounds are normal. There is no distension.     Palpations: Abdomen is soft. There is no mass.     Tenderness: There is no abdominal tenderness.  There is no guarding.  Skin:    General: Skin is warm and dry.  Neurological:     Mental Status: She is alert and oriented to person, place, and time.  Psychiatric:        Behavior: Behavior normal.      ED Treatments / Results  Labs (all labs ordered are listed, but only abnormal results are displayed) Labs Reviewed - No data to display  EKG None  Radiology Dg Chest 2 View  Result Date: 12/04/2018 CLINICAL DATA:  57 y/o  F; shortness of breath. History of asthma. EXAM: CHEST - 2 VIEW COMPARISON:  02/07/2018 chest radiograph. FINDINGS: Stable heart size and mediastinal contours are within normal limits. Both lungs are clear. The visualized skeletal structures are unremarkable. IMPRESSION: No acute pulmonary process identified. Electronically Signed   By: Kristine Garbe M.D.   On: 12/04/2018 21:49    Procedures Procedures (including critical care time)  Medications Ordered in ED Medications  albuterol (PROVENTIL) (2.5 MG/3ML) 0.083% nebulizer solution 5 mg (5 mg Nebulization Given 12/04/18 2156)  ipratropium-albuterol (DUONEB) 0.5-2.5 (3) MG/3ML nebulizer solution 3 mL (3 mLs Nebulization Given 12/05/18 0034)     Initial Impression / Assessment and Plan / ED Course  I have reviewed the triage vital signs and the nursing notes.  Pertinent labs & imaging results that were available during my care of the patient were reviewed by me and considered in my medical decision making (see chart for details).     Patient with mild persistent asthma, she has an asthma exacerbation secondary to URI symptoms.  Her chest x-ray  is negative for infiltrate.  I personally reviewed the images and agree with radiologic interpretation.  Patient states that she has had multiple rounds of oral prednisone and was hoping there was something else.  I offered Qvar and have given her a prescription however she is concerned she may not be able to afford it and asks for steroid taper.  Patient will also be discharged with a Medrol Dosepak.  She is to follow-up with her primary care physician.  I discussed return precautions.  She appears appropriate for discharge at this time  Final Clinical Impressions(s) / ED Diagnoses   Final diagnoses:  Moderate persistent asthma with exacerbation    ED Discharge Orders    None       Margarita Mail, PA-C 12/05/18 0111    Molpus, Jenny Reichmann, MD 12/05/18 [phone]

## 2018-12-14 ENCOUNTER — Ambulatory Visit: Payer: Medicare HMO | Admitting: Allergy & Immunology

## 2018-12-14 ENCOUNTER — Encounter: Payer: Self-pay | Admitting: Allergy & Immunology

## 2018-12-14 VITALS — BP 122/78 | HR 93 | Ht 63.0 in | Wt 249.0 lb

## 2018-12-14 DIAGNOSIS — J3089 Other allergic rhinitis: Secondary | ICD-10-CM

## 2018-12-14 DIAGNOSIS — R69 Illness, unspecified: Secondary | ICD-10-CM | POA: Diagnosis not present

## 2018-12-14 DIAGNOSIS — J302 Other seasonal allergic rhinitis: Secondary | ICD-10-CM | POA: Diagnosis not present

## 2018-12-14 DIAGNOSIS — J4521 Mild intermittent asthma with (acute) exacerbation: Secondary | ICD-10-CM | POA: Diagnosis not present

## 2018-12-14 DIAGNOSIS — J4541 Moderate persistent asthma with (acute) exacerbation: Secondary | ICD-10-CM | POA: Diagnosis not present

## 2018-12-14 DIAGNOSIS — J339 Nasal polyp, unspecified: Secondary | ICD-10-CM

## 2018-12-14 MED ORDER — ALBUTEROL SULFATE (2.5 MG/3ML) 0.083% IN NEBU: 2.5000 mg | INHALATION_SOLUTION | RESPIRATORY_TRACT | 11 refills | Status: DC | PRN

## 2018-12-14 MED ORDER — BUDESONIDE-FORMOTEROL FUMARATE 160-4.5 MCG/ACT IN AERO: 2.0000 | INHALATION_SPRAY | Freq: Two times a day (BID) | RESPIRATORY_TRACT | 5 refills | Status: DC

## 2018-12-14 NOTE — Progress Notes (Signed)
FOLLOW UP  Date of Service/Encounter:  12/14/18   Assessment:   Moderate persistent asthma - very poorly controlled but initiated Dupixent today  Seasonal and perennial allergic rhinitis (grasses, weeds, trees, indoor and outdoor molds, dust mite, roach)  Nasal polyposis - s/p two sinus surgeries (1990s and 2013)   [name] presents for a follow-up visit.  She continues to have problems with her asthma and her spirometry certainly reflects that.  Fortunately, she is reversible and it almost normalizes following the nebulizer treatment.  She is having trouble affording her medications, which I suspect is leading to some element of noncompliance.  We did sign her out to receive free medication through [employer], and I am optimistic that she will be approved for that.  We are also going to get dupilumab started for her to control her asthma as well as her nasal polyposis.  I did get some more information from her regarding her tax returns so that our Biologics coordinator will have that available to submit for free drug.  We did give her a loading dose of the dupilumab (600 mg).  She tolerated this well.  She does feel comfortable giving it to her self at home, as her son has diabetes and requires injections.  I am optimistic that the addition of the dupilumab will help control her symptoms more and decrease her need for prednisone.  Plan/Recommendations:   1. Seasonal and perennial allergic rhinitis - I think we need to hold off on allergy shots these seemed to be making things worse anyway. - Continue with the Flonase two sprays per nostril daily.  - Continue Astelin two sprays per nostril up to twice daily.  - Continue with Allegra one tablet daily (can take an extra if you have a particularly bad day).  - Continue with Singulair (montelukast) 10mg  daily.   2. Moderate persistent asthma - not well controlled - Lung testing was slightly worse than last time, but it did improve  with the nebulizer treatment. - [place] form filled out to help with drug cost assistance. - Dupixent sample provided today (helps with nasal polyps as well as asthma). - I am forwarding the tax information to Tammy.  - Spacer sample and demonstration provided. - Daily controller medication(s): Symbicort 160/4.61mcg two puffs twice daily with spacer - Prior to physical activity: albuterol 2 puffs 10-15 minutes before physical activity. - Rescue medications: albuterol 4 puffs every 4-6 hours as needed or albuterol nebulizer one vial every 4-6 hours as needed - Asthma control goals:  * Full participation in all desired activities (may need albuterol before activity) * Albuterol use two time or less a week on average (not counting use with activity) * Cough interfering with sleep two time or less a month * Oral steroids no more than once a year * No hospitalizations  3. Nasal polyposis - Hopefully the addition of the Dupixent will help.  - We will be able to administer this at home.   4. Return in about 2 months (around 02/12/2019).  Subjective:   Maria Franklin is a 58 y.o. female presenting today for follow up of  Chief Complaint  Patient presents with  . Asthma    not doing well     Maria Franklin has a history of the following: Patient Active Problem List   Diagnosis Date Noted  . OSA (obstructive sleep apnea) 04/29/2016  . GERD (gastroesophageal reflux disease) 10/28/2015  . Asthma, mild persistent 04/21/2015  .  Paranoid schizophrenia, chronic condition ([place]) 04/19/2014  . Seasonal allergies 04/19/2014  . DM2 (diabetes mellitus, type 2) ([place]) 04/19/2014  . Angina, Ludwig 12/30/2013  . Hypertension 01/10/2013  . Hyperlipidemia 01/10/2013  . Hypothyroidism 01/10/2013    History obtained from: chart review and patient.  [facility] Primary Care Provider is Jearld Fenton, NP.     Maria Franklin is a 58 y.o. female presenting for a sick visit.  She last saw [place] in December  2019.  At that time, she was establishing care with [place].  Regarding her asthma, her lung testing was around 60% normal but did improve with the nebulizer treatment.  We started her on a prednisone burst.  We also started Dulera 200/5 mcg 2 puffs twice daily.  We also provided a spacer and demonstrated how to use it.  For her allergic rhinitis, we decided to hold off on allergy shots which she was receiving at her previous allergy office.  These seem to have made a lot of her symptoms worse, so she was fine with this decision.  We continued Flonase 2 sprays per nostril daily and added Astelin 2 sprays per nostril up to twice daily.  We continued with Allegra and Singulair.  She has a history of nasal polyposis and has had 2 surgeries.  We did provide information on Dupixent and she signed the consent form.  Since the last visit, she has mostly done well.  She was seen in the ER in December 2019 for an asthma exacerbation, but she did not receive any prednisone.  She tells me today that she is just now starting to feel better.  She did complete her prednisone and over the last 3 days she has almost felt normal.  However, her spirometry does not reflect this today.   Asthma/Respiratory Symptom History: She remains on Symbicort 160/4.5 mcg 2 puffs twice daily.  She does have the spacer that we gave her at the last visit.  Despite the cost of the medication, she does endorse compliance.  With the Symbicort, her co-pay will be $50 once she reaches a $300 deductible.  This still is rather difficult for her to afford.  She has not talked to [employer] about getting free or reduced medication.  She is very interested in this.  She has not started the dupilumab.  I did talk to [place], who reported that she needed a more clear picture of her tax return information since the picture that was sent to her was blurry.  We did print 1 out today while she was in the clinic and will forward that to [place].  Allergic Rhinitis  Symptom History: She remains on her nasal sprays.  We did stop the allergy shots at the last visit since they did not seem to be causing any improvement and in fact were leading to worsening symptoms.  She remains on Flonase as well as Astelin.  She does feel like the Astelin is helping quite a bit, but her co-pay is kind of pricey for this.  Otherwise, there have been no changes to her past medical history, surgical history, family history, or social history.    Review of Systems: a 14-point review of systems is pertinent for what is mentioned in HPI.  Otherwise, all other systems were negative.  Constitutional: negative other than that listed in the HPI Eyes: negative other than that listed in the HPI Ears, nose, mouth, throat, and face: negative other than that listed in the HPI Respiratory: negative other  than that listed in the HPI Cardiovascular: negative other than that listed in the HPI Gastrointestinal: negative other than that listed in the HPI Genitourinary: negative other than that listed in the HPI Integument: negative other than that listed in the HPI Hematologic: negative other than that listed in the HPI Musculoskeletal: negative other than that listed in the HPI Neurological: negative other than that listed in the HPI Allergy/Immunologic: negative other than that listed in the HPI    Objective:   Blood pressure 122/78, pulse 93, height 5\' 3"  (1.6 m), weight 249 lb (112.9 kg), last menstrual period 02/02/2017, SpO2 97 %. Body mass index is 44.11 kg/m.   Physical Exam:  General: Alert, interactive, in no acute distress. Pleasant female.  Eyes: No conjunctival injection bilaterally, no discharge on the right, no discharge on the left and no Horner-Trantas dots present. PERRL bilaterally. EOMI without pain. No photophobia.  Ears: Right TM pearly gray with normal light reflex, Left TM pearly gray with normal light reflex, Right TM intact without perforation and Left TM  intact without perforation.  Nose/Throat: External nose within normal limits and septum midline. Turbinates edematous and pale with clear discharge. Posterior oropharynx erythematous with cobblestoning in the posterior oropharynx. Tonsils 2+ without exudates.  Tongue without thrush. Lungs: Mildly decreased breath sounds with expiratory wheezing bilaterally. No increased work of breathing. CV: Normal S1/S2. No murmurs. Capillary refill <2 seconds.  Skin: Warm and dry, without lesions or rashes. Neuro:   Grossly intact. No focal deficits appreciated. Responsive to questions.  Diagnostic studies:   Spirometry: results abnormal (FEV1: 1.11/40%, FVC: 1.68/39%, FEV1/FVC: 66%).    Spirometry consistent with mixed obstructive and restrictive disease. Xopenex/Atrovent nebulizer treatment given in clinic with significant improvement in FEV1 and FVC per ATS criteria.  Her FEV1 increased to 1.55 L (75%), while her FVC increased to 2.34 L (89%).  Allergy Studies: none     Salvatore Marvel, MD  [facility]

## 2018-12-14 NOTE — Patient Instructions (Addendum)
1. Seasonal and perennial allergic rhinitis - I think we need to hold off on allergy shots these seemed to be making things worse anyway. - Continue with the Flonase two sprays per nostril daily.  - Continue Astelin two sprays per nostril up to twice daily.  - Continue with Allegra one tablet daily (can take an extra if you have a particularly bad day).  - Continue with Singulair (montelukast) 10mg  daily.   2. Moderate persistent asthma - not well controlled - Lung testing was slightly worse than last time, but it did improve with the nebulizer treatment. - [place] & [place] form filled out to help with drug cost assistance. - Dupixent sample provided today (helps with nasal polyps as well as asthma). - I am forwarding the tax information to Tammy.  - Spacer sample and demonstration provided. - Daily controller medication(s): Symbicort 160/4.58mcg two puffs twice daily with spacer - Prior to physical activity: albuterol 2 puffs 10-15 minutes before physical activity. - Rescue medications: albuterol 4 puffs every 4-6 hours as needed or albuterol nebulizer one vial every 4-6 hours as needed - Asthma control goals:  * Full participation in all desired activities (may need albuterol before activity) * Albuterol use two time or less a week on average (not counting use with activity) * Cough interfering with sleep two time or less a month * Oral steroids no more than once a year * No hospitalizations  3. Nasal polyposis - Hopefully the addition of the Dupixent will help.  - We will be able to administer this at home.   4. Return in about 2 months (around 02/12/2019).   Please inform us of any Emergency Department visits, hospitalizations, or changes in symptoms. Call us before going to the ED for breathing or allergy symptoms since we might be able to fit you in for a sick visit. Feel free to contact us anytime with any questions, problems, or concerns.  It was a pleasure to meet you today!  Websites  that have reliable patient information: 1. American Academy of Asthma, Allergy, and Immunology: www.aaaai.org 2. Food Allergy Research and Education (FARE): foodallergy.org 3. Mothers of Asthmatics: http://www.asthmacommunitynetwork.org 4. American College of Allergy, Asthma, and Immunology: MonthlyElectricBill.co.uk   Make sure you are registered to vote! If you have moved or changed any of your contact information, you will need to get this updated before voting!

## 2018-12-18 ENCOUNTER — Telehealth: Payer: Self-pay | Admitting: Allergy & Immunology

## 2018-12-18 NOTE — Telephone Encounter (Signed)
Spoke to patient advised we could provide samples, placed 2 samples up front. Patient also applied for [place] to help with cost and they should reach out to her patient verbalized understanding.

## 2018-12-18 NOTE — Telephone Encounter (Signed)
Patient was seen 12-14-18. She was given a sample of Symbicort and one was called into her pharmacy. She said it is $300. She said there was a program that pays for this and she wants to know if she was signed up for that, because she cannot afford this otherwise. She is out of the sample.

## 2018-12-20 ENCOUNTER — Telehealth: Payer: Self-pay

## 2018-12-20 NOTE — Telephone Encounter (Signed)
Contacted patient regarding her AZ&me stating patient needs to finish process of application by submitting 3% of medication spending amounts, proof of insurance and income documentation. Patient was called and advised. She did voice her opinion regarding her application stating she did this in the past and was denied due to her MCR. Patient was advise this was a new year and to try the process. She is concern it wont be approved and the cost out ways the amount she wants to spend on the medication. We gave her samples and will need to see if samples need to be continued or if Symbicort drug rep can offer an alternative?

## 2018-12-20 NOTE — Telephone Encounter (Signed)
I think that the only way we are going to figure out an alternative is to prescribe it and see what her copay is.   Alternatives include:  Dulera 200/5 two puffs BID Breo 200/25 one puff once daily Advair Diskus (ok to use generic version if needed): 250/50 one puff BID  Salvatore Marvel, MD [facility]

## 2018-12-20 NOTE — Telephone Encounter (Signed)
Patient is contacting them and will see if the coverage will work for this year and will contact the office back.

## 2018-12-27 ENCOUNTER — Encounter (HOSPITAL_COMMUNITY): Payer: Self-pay

## 2018-12-27 ENCOUNTER — Ambulatory Visit (HOSPITAL_COMMUNITY): Payer: Medicare HMO | Admitting: Psychiatry

## 2018-12-27 ENCOUNTER — Encounter (HOSPITAL_COMMUNITY): Payer: Self-pay | Admitting: Psychiatry

## 2018-12-27 VITALS — BP 122/76 | HR 76 | Ht 63.0 in | Wt 252.0 lb

## 2018-12-27 DIAGNOSIS — F200 Paranoid schizophrenia: Secondary | ICD-10-CM | POA: Diagnosis not present

## 2018-12-27 DIAGNOSIS — R419 Unspecified symptoms and signs involving cognitive functions and awareness: Secondary | ICD-10-CM | POA: Diagnosis not present

## 2018-12-27 MED ORDER — HALOPERIDOL 1 MG PO TABS: 1.0000 mg | ORAL_TABLET | Freq: Every day | ORAL | 1 refills | Status: DC

## 2018-12-27 NOTE — Progress Notes (Signed)
[place] MD/PA/NP OP Progress Note  12/27/2018 2:51 PM Maria Franklin  MRN:  [id number]  Chief Complaint: I am doing better.  I am taking one fourth of Haldol 5 mg because I could not find 1 mg tablet 6 months ago.  HPI: Maria Franklin came for her follow-up appointment.  She was last seen in June 2019.  She is been taking one fourth of Haldol 5 mg because she had difficulty finding 1 mg tablets 6 months ago.  Overall she describes her mood is good and she denies any paranoia, hallucination or any irritability.  She admitted having memory problem which she described for a long time but now she feels that it is getting worse.  She has difficulty remembering names, forgetting to turn off the stove, forgetting telephone numbers and sometimes directions.  She admitted that she like to be evaluated for dementia.  She had appointment to see neurology on March 4.  Overall things are going well.  She reconnected to her daughter and able to see her grandkids on the Christmas.  Her son who lives in [place] had a baby boy few months ago.  In September she was stressed out because 1 of the family member was living with them and that causes a lot of stress but things are much better now.  She is sleeping good.  She denies any crying spells, suicidal thoughts or any homicidal thought.  She has no tremors shakes or any EPS.  She like to continue Haldol if possible 1 mg at bedtime.  Visit Diagnosis:    ICD-10-CM   1. Cognitive complaints R41.9   2. Paranoid schizophrenia, chronic condition ([place]) F20.0 haloperidol (HALDOL) 1 MG tablet    Past Psychiatric History: Reviewed. History of paranoia and delusions. H/O overdose and inpatient at [facility]. Again admitted in 4 months due to noncompliant with medication. Abilify worked well but could not afford. No history of physical sexual verbal and emotional abuse.  Past Medical History:  Past Medical History:  Diagnosis Date  . Allergy   . Angio-edema    . Asthma   . Chicken pox   . Diabetes mellitus without complication ([place])   . GERD (gastroesophageal reflux disease)   . Hyperlipidemia   . Hypertension   . Recurrent upper respiratory infection (URI)   . Schizophrenia ([place])   . Thyroid disease   . Urticaria     Past Surgical History:  Procedure Laterality Date  . SINOSCOPY    . SINUS IRRIGATION    . TONSILLECTOMY      Family Psychiatric History: Reviewed.  Family History:  Family History  Problem Relation Age of Onset  . Mental illness Mother   . Hypertension Mother   . Schizophrenia Mother   . Depression Mother   . Cancer Father        LUNG  . Cancer Sister        BREAST  . Breast cancer Sister   . Mental illness Brother   . Hypertension Brother   . Schizophrenia Brother   . Depression Brother   . Heart disease Maternal Grandmother   . Depression Maternal Grandmother   . Schizophrenia Maternal Grandmother   . Schizophrenia Brother   . Schizophrenia Maternal Uncle   . Breast cancer Maternal Aunt     Social History:  Social History   Socioeconomic History  . Marital status: Married    Spouse name: Not on file  . Number of children: Not on file  .  Years of education: Not on file  . Highest education level: Not on file  Occupational History  . Not on file  Social Needs  . Financial resource strain: Not on file  . Food insecurity:    Worry: Not on file    Inability: Not on file  . Transportation needs:    Medical: Not on file    Non-medical: Not on file  Tobacco Use  . Smoking status: Former Smoker    Packs/day: 0.25    Years: 6.00    Pack years: 1.50  . Smokeless tobacco: Never Used  . Tobacco comment: QUIT IN HER EARLY 20'S  Substance and Sexual Activity  . Alcohol use: No    Alcohol/week: 0.0 standard drinks  . Drug use: No  . Sexual activity: Not Currently  Lifestyle  . Physical activity:    Days per week: Not on file    Minutes per session: Not on file  . Stress: Not on file   Relationships  . Social connections:    Talks on phone: Not on file    Gets together: Not on file    Attends religious service: Not on file    Active member of club or organization: Not on file    Attends meetings of clubs or organizations: Not on file    Relationship status: Not on file  Other Topics Concern  . Not on file  Social History Narrative   3 CHILDREN   RECENTLY MOVED TO [place]   SEEKING EMPLOYMENT    Allergies:  Allergies  Allergen Reactions  . Bee Pollen Cough  . Pollen Extract Cough  . Latex Itching, Rash and Other (See Comments)    Severe itching    Metabolic Disorder Labs: Lab Results  Component Value Date   HGBA1C 6.6 (H) 02/14/2018   No results found for: PROLACTIN Lab Results  Component Value Date   CHOL 155 02/14/2018   TRIG 84.0 02/14/2018   HDL 66.30 02/14/2018   CHOLHDL 2 02/14/2018   VLDL 16.8 02/14/2018   LDLCALC 72 02/14/2018   LDLCALC 82 05/19/2017   Lab Results  Component Value Date   TSH 2.99 02/14/2018   TSH 2.39 05/19/2017    Therapeutic Level Labs: No results found for: LITHIUM No results found for: VALPROATE No components found for:  CBMZ  Current Medications: Current Outpatient Medications  Medication Sig Dispense Refill  . albuterol (VENTOLIN HFA) 108 (90 Base) MCG/ACT inhaler INHALE ONE TO TWO PUFFS INTO LUNGS EVERY 6 HOURS AS NEEDED FOR WHEEZING OR SHORTNESS OF BREATH (Patient taking differently: Inhale 2 puffs into the lungs every 6 (six) hours as needed for wheezing or shortness of breath. ) 18 each 2  . amLODipine (NORVASC) 10 MG tablet Take 1 tablet (10 mg total) by mouth daily. 90 tablet 1  . Apoaequorin (PREVAGEN EXTRA STRENGTH) 20 MG CAPS Take 20 mg by mouth daily.    Marland Kitchen atorvastatin (LIPITOR) 10 MG tablet TAKE 1 TABLET BY MOUTH ONCE DAILY AT  6  PM (Patient taking differently: Take 10 mg by mouth daily at 6 PM. ) 90 tablet 1  . BIOTIN PO Take 1 tablet by mouth daily.    . budesonide-formoterol (SYMBICORT)  160-4.5 MCG/ACT inhaler Inhale 2 puffs into the lungs 2 (two) times daily. 1 Inhaler 5  . carvedilol (COREG) 12.5 MG tablet Take 1 tablet (12.5 mg total) by mouth 2 (two) times daily with a meal. 180 tablet 1  . EPINEPHrine 0.3 mg/0.3 mL IJ SOAJ injection  Inject 0.3 mg into the muscle once.     . fexofenadine (ALLEGRA) 180 MG tablet Take 1 tablet (180 mg total) by mouth daily. (Patient taking differently: Take 180 mg by mouth every evening. ) 30 tablet 2  . fluticasone (FLONASE) 50 MCG/ACT nasal spray Place 2 sprays into both nostrils daily.    . haloperidol (HALDOL) 5 MG tablet Take 0.5 tablets (2.5 mg total) by mouth at bedtime. (Patient taking differently: Take 1.25 mg by mouth every evening. ) 30 tablet 0  . hydrALAZINE (APRESOLINE) 10 MG tablet Take 1 tablet (10 mg total) by mouth 2 (two) times daily. 180 tablet 1  . levothyroxine (SYNTHROID, LEVOTHROID) 50 MCG tablet TAKE 1 TABLET BY MOUTH ONCE DAILY BEFORE BREAKFAST (Patient taking differently: Take 50 mcg by mouth daily before breakfast. ) 90 tablet 1  . lisinopril (PRINIVIL,ZESTRIL) 40 MG tablet TAKE 1 TABLET BY MOUTH ONCE DAILY (Patient taking differently: Take 40 mg by mouth daily. ) 90 tablet 0  . metFORMIN (GLUCOPHAGE-XR) 500 MG 24 hr tablet Take 1,000 mg by mouth every evening.     . montelukast (SINGULAIR) 10 MG tablet Take 10 mg by mouth daily.     . Omega-3 Fatty Acids (FISH OIL PO) Take 1 capsule by mouth daily.    Marland Kitchen omeprazole (PRILOSEC) 40 MG capsule Take 1 capsule (40 mg total) by mouth daily. 90 capsule 1  . albuterol (PROVENTIL) (2.5 MG/3ML) 0.083% nebulizer solution Take 3 mLs (2.5 mg total) by nebulization every 4 (four) hours as needed for wheezing or shortness of breath. (Patient not taking: Reported on 12/27/2018) 75 mL 11   No current facility-administered medications for this visit.      Musculoskeletal: Strength & Muscle Tone: within normal limits Gait & Station: normal Patient leans: N/A  Psychiatric Specialty  Exam: ROS  Blood pressure 122/76, pulse 76, height 5\' 3"  (1.6 m), weight 252 lb (114.3 kg), last menstrual period 02/02/2017, SpO2 97 %.There is no height or weight on file to calculate BMI.  General Appearance: Casual  Eye Contact:  Good  Speech:  Clear and Coherent  Volume:  Normal  Mood:  Anxious  Affect:  Congruent  Thought Process:  Goal Directed  Orientation:  Full (Time, Place, and Person)  Thought Content: Logical   Suicidal Thoughts:  No  Homicidal Thoughts:  No  Memory:  Immediate;   Fair Recent;   Fair Remote;   Good  Judgement:  Good  Insight:  Fair  Psychomotor Activity:  Normal  Concentration:  Concentration: Fair and Attention Span: Fair  Recall:  [employer] of Knowledge: Good  Language: Good  Akathisia:  No  Handed:  Right  AIMS (if indicated): not done  Assets:  Communication Skills Desire for Improvement Housing Social Support  ADL's:  Intact  Cognition: Impaired,  Mild  Sleep:  Good   Screenings: PHQ2-9     Office Visit from 02/14/2018 in [place] at [facility] Visit from 05/19/2017 in [place] at [facility] Visit from 11/04/2016 in [place] at [facility] Visit from 04/29/2016 in [place] at [facility] Visit from 10/28/2015 in [place] at [facility] Total Score  0  1  0  0  0  PHQ-9 Total Score  0  -  -  -  -       Assessment and Plan: Schizophrenia chronic paranoid type.  Discussed her chronic memory impairment which could be due to underlying psychiatric  illness but also she is taking medication which could be contributing to her cognitive decline.  However she still like to get checked and reevaluated and had appointment to see neurology.  I agree that she should keep appointment and have neuroimaging studies to rule out any organic cause of dementia.  I will call prescription Haldol 1 mg at bedtime.  If she is unable to get 1 mg tablet then she will call us  back.  Recommended to call us back if she is any question or any concern.  Follow-up in 6 months.   Kathlee Nations, MD 12/27/2018, 2:51 PM

## 2018-12-27 NOTE — Pre-Procedure Instructions (Signed)
Maria Franklin  12/27/2018      Your procedure is scheduled on January 05, 2019.  Report to [facility] Admitting at 05:30 A.M.  Call this number if you have problems the morning of surgery:  [phone]   Remember:  Do not eat or drink after midnight.    Take these medicines the morning of surgery with A SIP OF WATER : Amlodipine (Norvasc) Budesonide-Formoterol (Symbicort) inhaler Carvedilol (Coreg) Fluticasone (Flonase) nasal spray Hydralazine (Apresoline) Levothyroxine (Synthroid) Montelukast (Singulair) Omeprazole (Prilosec) Albuterol (Ventolin) inhaler if needed---bring with you   7 days prior to surgery STOP taking any Aspirin (unless otherwise instructed by your surgeon), Aleve, Naproxen, Ibuprofen, Motrin, Advil, Goody's, BC's, all herbal medications, fish oil, and all vitamins.   WHAT DO I DO ABOUT MY DIABETES MEDICATION?   Marland Kitchen Do not take oral diabetes medicines (pills) the morning of surgery. DO NOT take your Metformin (Glucophage) the morning of surgery.     How to Manage Your Diabetes Before and After Surgery  Why is it important to control my blood sugar before and after surgery? . Improving blood sugar levels before and after surgery helps healing and can limit problems. . A way of improving blood sugar control is eating a healthy diet by: o  Eating less sugar and carbohydrates o  Increasing activity/exercise o  Talking with your doctor about reaching your blood sugar goals . High blood sugars (greater than 180 mg/dL) can raise your risk of infections and slow your recovery, so you will need to focus on controlling your diabetes during the weeks before surgery. . Make sure that the doctor who takes care of your diabetes knows about your planned surgery including the date and location.  How do I manage my blood sugar before surgery? . Check your blood sugar at least 4 times a day, starting 2 days before surgery, to make sure that the level is  not too high or low. o Check your blood sugar the morning of your surgery when you wake up and every 2 hours until you get to the Short Stay unit. . If your blood sugar is less than 70 mg/dL, you will need to treat for low blood sugar: o Do not take insulin. o Treat a low blood sugar (less than 70 mg/dL) with  cup of clear juice (cranberry or apple), 4 glucose tablets, OR glucose gel. o Recheck blood sugar in 15 minutes after treatment (to make sure it is greater than 70 mg/dL). If your blood sugar is not greater than 70 mg/dL on recheck, call [phone] for further instructions. . Report your blood sugar to the short stay nurse when you get to Short Stay.  . If you are admitted to the hospital after surgery: o Your blood sugar will be checked by the staff and you will probably be given insulin after surgery (instead of oral diabetes medicines) to make sure you have good blood sugar levels. o The goal for blood sugar control after surgery is 80-180 mg/dL.      Do not wear jewelry, make-up or nail polish.  Do not wear lotions, powders, or perfumes, or deodorant.  Do not shave 48 hours prior to surgery.    Do not bring valuables to the hospital.  [facility] is not responsible for any belongings or valuables.  Contacts, dentures or bridgework may not be worn into surgery.  Leave your suitcase in the car.  After surgery it may be brought to your room.  For patients admitted to the hospital, discharge time will be determined by your treatment team.  Patients discharged the day of surgery will not be allowed to drive home.   Special instructions:   [place]- Preparing For Surgery  Before surgery, you can play an important role. Because skin is not sterile, your skin needs to be as free of germs as possible. You can reduce the number of germs on your skin by washing with CHG (chlorahexidine gluconate) Soap before surgery.  CHG is an antiseptic cleaner which kills germs and bonds with the  skin to continue killing germs even after washing.    Oral Hygiene is also important to reduce your risk of infection.  Remember - BRUSH YOUR TEETH THE MORNING OF SURGERY WITH YOUR REGULAR TOOTHPASTE  Please do not use if you have an allergy to CHG or antibacterial soaps. If your skin becomes reddened/irritated stop using the CHG.  Do not shave (including legs and underarms) for at least 48 hours prior to first CHG shower. It is OK to shave your face.  Please follow these instructions carefully.   1. Shower the NIGHT BEFORE SURGERY and the MORNING OF SURGERY with CHG.   2. If you chose to wash your hair, wash your hair first as usual with your normal shampoo.  3. After you shampoo, rinse your hair and body thoroughly to remove the shampoo.  4. Use CHG as you would any other liquid soap. You can apply CHG directly to the skin and wash gently with a scrungie or a clean washcloth.   5. Apply the CHG Soap to your body ONLY FROM THE NECK DOWN.  Do not use on open wounds or open sores. Avoid contact with your eyes, ears, mouth and genitals (private parts). Wash Face and genitals (private parts)  with your normal soap.  6. Wash thoroughly, paying special attention to the area where your surgery will be performed.  7. Thoroughly rinse your body with warm water from the neck down.  8. DO NOT shower/wash with your normal soap after using and rinsing off the CHG Soap.  9. Pat yourself dry with a CLEAN TOWEL.  10. Wear CLEAN PAJAMAS to bed the night before surgery, wear comfortable clothes the morning of surgery  11. Place CLEAN SHEETS on your bed the night of your first shower and DO NOT SLEEP WITH PETS.    Day of Surgery:  Do not apply any deodorants/lotions.  Please wear clean clothes to the hospital/surgery center.   Remember to brush your teeth WITH YOUR REGULAR TOOTHPASTE.    Please read over the following fact sheets that you were given.

## 2018-12-28 ENCOUNTER — Encounter (HOSPITAL_COMMUNITY)
Admission: RE | Admit: 2018-12-28 | Discharge: 2018-12-28 | Disposition: A | Discharge status: Home / Self Care | Payer: Medicare HMO | Source: Ambulatory Visit | Attending: Otolaryngology | Admitting: Otolaryngology

## 2018-12-28 ENCOUNTER — Encounter (HOSPITAL_COMMUNITY): Payer: Self-pay

## 2018-12-28 ENCOUNTER — Other Ambulatory Visit: Payer: Self-pay

## 2018-12-28 DIAGNOSIS — Z01812 Encounter for preprocedural laboratory examination: Secondary | ICD-10-CM | POA: Diagnosis present

## 2018-12-28 HISTORY — DX: Schizophrenia, unspecified: F20.9

## 2018-12-28 HISTORY — DX: Gastro-esophageal reflux disease without esophagitis: K21.9

## 2018-12-28 HISTORY — DX: Anemia, unspecified: D64.9

## 2018-12-28 HISTORY — DX: Thyrotoxicosis, unspecified without thyrotoxic crisis or storm: E05.90

## 2018-12-28 HISTORY — DX: Sleep apnea, unspecified: G47.30

## 2018-12-28 HISTORY — DX: Type 2 diabetes mellitus without complications: E11.9

## 2018-12-28 LAB — BASIC METABOLIC PANEL
Anion gap: 8 (ref 5–15)
BUN: 10 mg/dL (ref 6–20)
CALCIUM: 8.9 mg/dL (ref 8.9–10.3)
CO2: 27 mmol/L (ref 22–32)
Chloride: 105 mmol/L (ref 98–111)
Creatinine, Ser: 0.96 mg/dL (ref 0.44–1.00)
GFR calc Af Amer: >60 mL/min (ref >60)
GFR calc non Af Amer: >60 mL/min (ref >60)
Glucose, Bld: 108 mg/dL — ABNORMAL HIGH (ref 70–99)
Potassium: 3.8 mmol/L (ref 3.5–5.1)
SODIUM: 140 mmol/L (ref 135–145)

## 2018-12-28 LAB — CBC
HCT: 37.6 % (ref 36.0–46.0)
Hemoglobin: 11.3 g/dL — ABNORMAL LOW (ref 12.0–15.0)
MCH: 26.3 pg (ref 26.0–34.0)
MCHC: 30.1 g/dL (ref 30.0–36.0)
MCV: 87.4 fL (ref 80.0–100.0)
Platelets: 289 K/uL (ref 150–400)
RBC: 4.3 MIL/uL (ref 3.87–5.11)
RDW: 13.9 % (ref 11.5–15.5)
WBC: 7.5 K/uL (ref 4.0–10.5)
nRBC: 0 % (ref 0.0–0.2)

## 2018-12-28 LAB — HEMOGLOBIN A1C
Hgb A1c MFr Bld: 6.5 % — ABNORMAL HIGH (ref 4.8–5.6)
Mean Plasma Glucose: 139.85 mg/dL

## 2018-12-28 LAB — GLUCOSE, CAPILLARY: Glucose-Capillary: 106 mg/dL — ABNORMAL HIGH (ref 70–99)

## 2018-12-28 NOTE — Pre-Procedure Instructions (Signed)
Maria Franklin  12/28/2018      Your procedure is scheduled on Friday,  January 05, 2019.   Report to [facility] Admitting at 05:30 A.M.             (posted surgery time 7:30a - 9:11a)   Call this number if you have problems the morning of surgery:  [phone]   Remember:  Do not eat any foods or drink any liquids after midnight, Thursday.    Take these medicines the morning of surgery with A SIP OF WATER : Amlodipine (Norvasc) Budesonide-Formoterol (Symbicort) inhaler Carvedilol (Coreg) Fluticasone (Flonase) nasal spray Hydralazine (Apresoline) Levothyroxine (Synthroid) Montelukast (Singulair) Omeprazole (Prilosec) Albuterol (Ventolin) inhaler if needed---bring with you   7 days prior to surgery STOP taking any Aspirin (unless otherwise instructed by your surgeon), Aleve, Naproxen, Ibuprofen, Motrin, Advil, Goody's, BC's, all herbal medications, fish oil, and all vitamins.   WHAT DO I DO ABOUT MY DIABETES MEDICATION?   Marland Kitchen Do not take oral diabetes medicines (pills) the morning of surgery. DO NOT take your Metformin (Glucophage) the morning of surgery.     How to Manage Your Diabetes Before and After Surgery  Why is it important to control my blood sugar before and after surgery? . Improving blood sugar levels before and after surgery helps healing and can limit problems. . A way of improving blood sugar control is eating a healthy diet by: o  Eating less sugar and carbohydrates o  Increasing activity/exercise o  Talking with your doctor about reaching your blood sugar goals . High blood sugars (greater than 180 mg/dL) can raise your risk of infections and slow your recovery, so you will need to focus on controlling your diabetes during the weeks before surgery. . Make sure that the doctor who takes care of your diabetes knows about your planned surgery including the date and location.  How do I manage my blood sugar before surgery? . Check your  blood sugar at least 4 times a day, starting 2 days before surgery, to make sure that the level is not too high or low. o Check your blood sugar the morning of your surgery when you wake up and every 2 hours until you get to the Short Stay unit.  . If your blood sugar is less than 70 mg/dL, you will need to treat for low blood sugar: o Do not take insulin. o Treat a low blood sugar (less than 70 mg/dL) with  cup of clear juice (cranberry or apple), 4 glucose tablets, OR glucose gel.                    NO ORANGE JUICE.  o Recheck blood sugar in 15 minutes after treatment (to make sure it is greater than 70 mg/dL). If your blood sugar is not greater than 70 mg/dL on recheck, call [phone] for further instructions. . Report your blood sugar to the short stay nurse when you get to Short Stay.  . If you are admitted to the hospital after surgery: o Your blood sugar will be checked by the staff and you will probably be given insulin after surgery (instead of oral diabetes medicines) to make sure you have good blood sugar levels. o The goal for blood sugar control after surgery is 80-180 mg/dL.      Do not wear jewelry, make-up or nail polish.  Do not wear lotions, powders, or perfumes, or deodorant.  Do not shave 48 hours prior  to surgery.    Do not bring valuables to the hospital.  [facility] is not responsible for any belongings or valuables.  Contacts, dentures or bridgework may not be worn into surgery.  Leave your suitcase in the car.  After surgery it may be brought to your room.  For patients admitted to the hospital, discharge time will be determined by your treatment team.  Patients discharged the day of surgery will not be allowed to drive home, AND will need someone to stay with you for the first 24 hrs.    Special instructions:   [place]- Preparing For Surgery  Before surgery, you can play an important role. Because skin is not sterile, your skin needs to be as free  of germs as possible. You can reduce the number of germs on your skin by washing with CHG (chlorahexidine gluconate) Soap before surgery.  CHG is an antiseptic cleaner which kills germs and bonds with the skin to continue killing germs even after washing.    Oral Hygiene is also important to reduce your risk of infection.    Remember - BRUSH YOUR TEETH THE MORNING OF SURGERY WITH YOUR REGULAR TOOTHPASTE  Please do not use if you have an allergy to CHG or antibacterial soaps. If your skin becomes reddened/irritated stop using the CHG.  Do not shave (including legs and underarms) for at least 48 hours prior to first CHG shower. It is OK to shave your face.  Please follow these instructions carefully.   1. Shower the NIGHT BEFORE SURGERY and the MORNING OF SURGERY with CHG.   2. If you chose to wash your hair, wash your hair first as usual with your normal shampoo.  3. After you shampoo, rinse your hair and body thoroughly to remove the shampoo.  4. Use CHG as you would any other liquid soap. You can apply CHG directly to the skin and wash gently with a scrungie or a clean washcloth.   5. Apply the CHG Soap to your body ONLY FROM THE NECK DOWN.  Do not use on open wounds or open sores. Avoid contact with your eyes, ears, mouth and genitals (private parts). Wash Face and genitals (private parts)  with your normal soap.  6. Wash thoroughly, paying special attention to the area where your surgery will be performed.  7. Thoroughly rinse your body with warm water from the neck down.  8. DO NOT shower/wash with your normal soap after using and rinsing off the CHG Soap.  9. Pat yourself dry with a CLEAN TOWEL.  10. Wear CLEAN PAJAMAS to bed the night before surgery, wear comfortable clothes the morning of surgery  11. Place CLEAN SHEETS on your bed the night of your first shower and DO NOT SLEEP WITH PETS.    Day of Surgery:  Do not apply any deodorants/lotions.  Please wear clean clothes  to the hospital/surgery center.   Remember to brush your teeth WITH YOUR REGULAR TOOTHPASTE.    Please read over the following fact sheets that you were given.

## 2018-12-28 NOTE — Progress Notes (Signed)
PCP -  Webb Silversmith  LOV 11/2018  Psychiatrist  Dr. Arta Bruce  LOV 05/2018  [facility] handles the diabetes, but she hasn't been back to see them recently.  She was told that her numbers were good, and to lose weight and do excercises.  Cardiologist -   Chest x-ray -  EKG -  12/04/2018  Stress Test - n/a ECHO - n/a Cardiac Cath - n/a  Sleep Study - 06/2016 CPAP - yes  Fasting Blood Sugar -  Checks Blood Sugar _DOESN'T____ times a day Last A1C  6.6  02/2018  Blood Thinner Instructions: n/a Aspirin Instructions:  Anesthesia review: n/a  Patient denies shortness of breath, fever, cough and chest pain at PAT appointment   Patient verbalized understanding of instructions that were given to them at the PAT appointment. Patient was also instructed that they will need to review over the PAT instructions again at home before surgery.

## 2019-01-04 ENCOUNTER — Other Ambulatory Visit (HOSPITAL_COMMUNITY): Payer: Self-pay | Admitting: Psychiatry

## 2019-01-04 DIAGNOSIS — F200 Paranoid schizophrenia: Secondary | ICD-10-CM

## 2019-01-04 NOTE — Anesthesia Preprocedure Evaluation (Addendum)
Anesthesia Evaluation  Patient identified by MRN, date of birth, ID band Patient awake    Reviewed: Allergy & Precautions, NPO status , Patient's Chart, lab work & pertinent test results, reviewed documented beta blocker date and time   Airway Mallampati: II  TM Distance: >3 FB Neck ROM: Full    Dental  (+) Chipped,    Pulmonary asthma , sleep apnea and Continuous Positive Airway Pressure Ventilation , former smoker,    Pulmonary exam normal breath sounds clear to auscultation       Cardiovascular hypertension, Pt. on medications and Pt. on home beta blockers Normal cardiovascular exam Rhythm:Regular Rate:Normal  ECG: rate 82. Sinus rhythm Borderline left axis deviation   Neuro/Psych PSYCHIATRIC DISORDERS Schizophrenia negative neurological ROS     GI/Hepatic Neg liver ROS, GERD  Medicated and Controlled,  Endo/Other  diabetes, Oral Hypoglycemic AgentsHypothyroidism Morbid obesity  Renal/GU negative Renal ROS     Musculoskeletal negative musculoskeletal ROS (+)   Abdominal (+) + obese,   Peds  Hematology  (+) anemia , HLD   Anesthesia Other Findings Nasal polyps  Reproductive/Obstetrics                            Anesthesia Physical Anesthesia Plan  ASA: III  Anesthesia Plan: General   Post-op Pain Management:    Induction: Intravenous  PONV Risk Score and Plan: 3 and Midazolam, Dexamethasone, Ondansetron and Treatment may vary due to age or medical condition  Airway Management Planned: Oral ETT  Additional Equipment:   Intra-op Plan:   Post-operative Plan: Extubation in OR  Informed Consent: I have reviewed the patients History and Physical, chart, labs and discussed the procedure including the risks, benefits and alternatives for the proposed anesthesia with the patient or authorized representative who has indicated his/her understanding and acceptance.     Dental advisory  given  Plan Discussed with: CRNA  Anesthesia Plan Comments:         Anesthesia Quick Evaluation

## 2019-01-05 ENCOUNTER — Other Ambulatory Visit: Payer: Self-pay

## 2019-01-05 ENCOUNTER — Encounter (HOSPITAL_COMMUNITY)
Admission: RE | Disposition: A | Discharge status: Home / Self Care | Payer: Self-pay | Source: Ambulatory Visit | Attending: Otolaryngology

## 2019-01-05 ENCOUNTER — Ambulatory Visit (HOSPITAL_COMMUNITY)
Admission: RE | Admit: 2019-01-05 | Discharge: 2019-01-05 | Disposition: A | Discharge status: Home / Self Care | Payer: Medicare HMO | Source: Ambulatory Visit | Attending: Otolaryngology | Admitting: Otolaryngology

## 2019-01-05 ENCOUNTER — Encounter (HOSPITAL_COMMUNITY): Payer: Self-pay | Admitting: Otolaryngology

## 2019-01-05 ENCOUNTER — Ambulatory Visit (HOSPITAL_COMMUNITY): Payer: Medicare HMO | Admitting: Anesthesiology

## 2019-01-05 DIAGNOSIS — J328 Other chronic sinusitis: Secondary | ICD-10-CM | POA: Insufficient documentation

## 2019-01-05 DIAGNOSIS — Z87891 Personal history of nicotine dependence: Secondary | ICD-10-CM | POA: Diagnosis not present

## 2019-01-05 DIAGNOSIS — J329 Chronic sinusitis, unspecified: Secondary | ICD-10-CM

## 2019-01-05 DIAGNOSIS — Z7984 Long term (current) use of oral hypoglycemic drugs: Secondary | ICD-10-CM | POA: Diagnosis not present

## 2019-01-05 DIAGNOSIS — Z7951 Long term (current) use of inhaled steroids: Secondary | ICD-10-CM | POA: Insufficient documentation

## 2019-01-05 DIAGNOSIS — G4733 Obstructive sleep apnea (adult) (pediatric): Secondary | ICD-10-CM | POA: Diagnosis not present

## 2019-01-05 DIAGNOSIS — J343 Hypertrophy of nasal turbinates: Secondary | ICD-10-CM | POA: Insufficient documentation

## 2019-01-05 DIAGNOSIS — K219 Gastro-esophageal reflux disease without esophagitis: Secondary | ICD-10-CM | POA: Insufficient documentation

## 2019-01-05 DIAGNOSIS — J45909 Unspecified asthma, uncomplicated: Secondary | ICD-10-CM | POA: Diagnosis not present

## 2019-01-05 DIAGNOSIS — E119 Type 2 diabetes mellitus without complications: Secondary | ICD-10-CM | POA: Diagnosis not present

## 2019-01-05 DIAGNOSIS — E785 Hyperlipidemia, unspecified: Secondary | ICD-10-CM | POA: Insufficient documentation

## 2019-01-05 DIAGNOSIS — J330 Polyp of nasal cavity: Secondary | ICD-10-CM | POA: Diagnosis not present

## 2019-01-05 DIAGNOSIS — Z79899 Other long term (current) drug therapy: Secondary | ICD-10-CM | POA: Insufficient documentation

## 2019-01-05 DIAGNOSIS — R69 Illness, unspecified: Secondary | ICD-10-CM | POA: Diagnosis not present

## 2019-01-05 DIAGNOSIS — J322 Chronic ethmoidal sinusitis: Secondary | ICD-10-CM | POA: Diagnosis not present

## 2019-01-05 DIAGNOSIS — Z79890 Hormone replacement therapy (postmenopausal): Secondary | ICD-10-CM | POA: Insufficient documentation

## 2019-01-05 DIAGNOSIS — J339 Nasal polyp, unspecified: Secondary | ICD-10-CM | POA: Diagnosis not present

## 2019-01-05 DIAGNOSIS — E039 Hypothyroidism, unspecified: Secondary | ICD-10-CM | POA: Diagnosis not present

## 2019-01-05 DIAGNOSIS — Z6841 Body Mass Index (BMI) 40.0 and over, adult: Secondary | ICD-10-CM | POA: Insufficient documentation

## 2019-01-05 DIAGNOSIS — I10 Essential (primary) hypertension: Secondary | ICD-10-CM | POA: Insufficient documentation

## 2019-01-05 DIAGNOSIS — F209 Schizophrenia, unspecified: Secondary | ICD-10-CM | POA: Insufficient documentation

## 2019-01-05 DIAGNOSIS — J320 Chronic maxillary sinusitis: Secondary | ICD-10-CM | POA: Diagnosis not present

## 2019-01-05 DIAGNOSIS — J321 Chronic frontal sinusitis: Secondary | ICD-10-CM | POA: Diagnosis not present

## 2019-01-05 HISTORY — PX: SINUS ENDO W/FUSION: SHX777

## 2019-01-05 HISTORY — PX: TURBINATE REDUCTION: SHX6157

## 2019-01-05 LAB — GLUCOSE, CAPILLARY
Glucose-Capillary: 114 mg/dL — ABNORMAL HIGH (ref 70–99)
Glucose-Capillary: 136 mg/dL — ABNORMAL HIGH (ref 70–99)

## 2019-01-05 SURGERY — SINUS SURGERY, ENDOSCOPIC, USING COMPUTER-ASSISTED NAVIGATION
Anesthesia: General | Site: Nose

## 2019-01-05 MED ORDER — OXYMETAZOLINE HCL 0.05 % NA SOLN
NASAL | Status: AC
  Filled 2019-01-05: qty 15

## 2019-01-05 MED ORDER — ROCURONIUM BROMIDE 50 MG/5ML IV SOSY
PREFILLED_SYRINGE | INTRAVENOUS | Status: AC
  Filled 2019-01-05: qty 5

## 2019-01-05 MED ORDER — MUPIROCIN 2 % EX OINT
TOPICAL_OINTMENT | CUTANEOUS | Status: AC
  Filled 2019-01-05: qty 22

## 2019-01-05 MED ORDER — DEXAMETHASONE SODIUM PHOSPHATE 10 MG/ML IJ SOLN
10.0000 mg | Freq: Once | INTRAMUSCULAR | Status: AC
  Administered 2019-01-05: 10 mg via INTRAVENOUS
  Filled 2019-01-05: qty 1

## 2019-01-05 MED ORDER — FENTANYL CITRATE (PF) 250 MCG/5ML IJ SOLN
INTRAMUSCULAR | Status: AC
  Filled 2019-01-05: qty 5

## 2019-01-05 MED ORDER — OXYCODONE HCL 5 MG PO TABS: 5.0000 mg | ORAL_TABLET | Freq: Once | ORAL | Status: DC | PRN

## 2019-01-05 MED ORDER — STERILE WATER FOR IRRIGATION IR SOLN
Status: DC | PRN
  Administered 2019-01-05: 1000 mL

## 2019-01-05 MED ORDER — CARVEDILOL 12.5 MG PO TABS
12.5000 mg | ORAL_TABLET | Freq: Once | ORAL | Status: AC
  Administered 2019-01-05: 12.5 mg via ORAL

## 2019-01-05 MED ORDER — SODIUM CHLORIDE 0.9 % IR SOLN
Status: DC | PRN
  Administered 2019-01-05: 1000 mL

## 2019-01-05 MED ORDER — SUGAMMADEX SODIUM 200 MG/2ML IV SOLN
INTRAVENOUS | Status: DC | PRN
  Administered 2019-01-05: 225 mg via INTRAVENOUS

## 2019-01-05 MED ORDER — ROCURONIUM BROMIDE 50 MG/5ML IV SOSY
PREFILLED_SYRINGE | INTRAVENOUS | Status: DC | PRN
  Administered 2019-01-05: 10 mg via INTRAVENOUS
  Administered 2019-01-05: 50 mg via INTRAVENOUS

## 2019-01-05 MED ORDER — CEPHALEXIN 500 MG PO CAPS: 500.0000 mg | ORAL_CAPSULE | Freq: Three times a day (TID) | ORAL | 0 refills | Status: AC

## 2019-01-05 MED ORDER — LIDOCAINE 2% (20 MG/ML) 5 ML SYRINGE
INTRAMUSCULAR | Status: AC
  Filled 2019-01-05: qty 5

## 2019-01-05 MED ORDER — HYDROCODONE-ACETAMINOPHEN 5-325 MG PO TABS: 1.0000 | ORAL_TABLET | Freq: Four times a day (QID) | ORAL | 0 refills | Status: AC | PRN

## 2019-01-05 MED ORDER — PROPOFOL 10 MG/ML IV BOLUS
INTRAVENOUS | Status: AC
  Filled 2019-01-05: qty 20

## 2019-01-05 MED ORDER — ONDANSETRON HCL 4 MG/2ML IJ SOLN
INTRAMUSCULAR | Status: DC | PRN
  Administered 2019-01-05: 4 mg via INTRAVENOUS

## 2019-01-05 MED ORDER — LIDOCAINE-EPINEPHRINE 1 %-1:100000 IJ SOLN
INTRAMUSCULAR | Status: DC | PRN
  Administered 2019-01-05: 6 mL

## 2019-01-05 MED ORDER — CARVEDILOL 12.5 MG PO TABS
ORAL_TABLET | ORAL | Status: AC
  Administered 2019-01-05: 12.5 mg via ORAL
  Filled 2019-01-05: qty 1

## 2019-01-05 MED ORDER — MIDAZOLAM HCL 2 MG/2ML IJ SOLN
INTRAMUSCULAR | Status: DC | PRN
  Administered 2019-01-05: 2 mg via INTRAVENOUS

## 2019-01-05 MED ORDER — PHENYLEPHRINE 40 MCG/ML (10ML) SYRINGE FOR IV PUSH (FOR BLOOD PRESSURE SUPPORT)
PREFILLED_SYRINGE | INTRAVENOUS | Status: DC | PRN
  Administered 2019-01-05: 80 ug via INTRAVENOUS
  Administered 2019-01-05: 120 ug via INTRAVENOUS
  Administered 2019-01-05: 160 ug via INTRAVENOUS

## 2019-01-05 MED ORDER — SODIUM CHLORIDE 0.9 % IV SOLN
INTRAVENOUS | Status: DC | PRN
  Administered 2019-01-05: 50 ug/min via INTRAVENOUS

## 2019-01-05 MED ORDER — CHLORHEXIDINE GLUCONATE CLOTH 2 % EX PADS: 6.0000 | MEDICATED_PAD | Freq: Once | CUTANEOUS | Status: DC

## 2019-01-05 MED ORDER — HYDROMORPHONE HCL 1 MG/ML IJ SOLN: 0.2500 mg | INTRAMUSCULAR | Status: DC | PRN

## 2019-01-05 MED ORDER — PHENYLEPHRINE 40 MCG/ML (10ML) SYRINGE FOR IV PUSH (FOR BLOOD PRESSURE SUPPORT)
PREFILLED_SYRINGE | INTRAVENOUS | Status: AC
  Filled 2019-01-05: qty 10

## 2019-01-05 MED ORDER — PROPOFOL 10 MG/ML IV BOLUS
INTRAVENOUS | Status: DC | PRN
  Administered 2019-01-05: 200 mg via INTRAVENOUS

## 2019-01-05 MED ORDER — PROMETHAZINE HCL 25 MG/ML IJ SOLN: 6.2500 mg | INTRAMUSCULAR | Status: DC | PRN

## 2019-01-05 MED ORDER — 0.9 % SODIUM CHLORIDE (POUR BTL) OPTIME
TOPICAL | Status: DC | PRN
  Administered 2019-01-05: 1000 mL

## 2019-01-05 MED ORDER — MIDAZOLAM HCL 2 MG/2ML IJ SOLN
INTRAMUSCULAR | Status: AC
  Filled 2019-01-05: qty 2

## 2019-01-05 MED ORDER — ONDANSETRON HCL 4 MG/2ML IJ SOLN
INTRAMUSCULAR | Status: AC
  Filled 2019-01-05: qty 2

## 2019-01-05 MED ORDER — OXYCODONE HCL 5 MG/5ML PO SOLN: 5.0000 mg | Freq: Once | ORAL | Status: DC | PRN

## 2019-01-05 MED ORDER — SODIUM CHLORIDE 0.9 % IV SOLN: INTRAVENOUS | Status: DC | PRN

## 2019-01-05 MED ORDER — OXYMETAZOLINE HCL 0.05 % NA SOLN
NASAL | Status: DC | PRN
  Administered 2019-01-05: 1

## 2019-01-05 MED ORDER — TRIAMCINOLONE ACETONIDE 40 MG/ML IJ SUSP
INTRAMUSCULAR | Status: AC
  Filled 2019-01-05: qty 5

## 2019-01-05 MED ORDER — LACTATED RINGERS IV SOLN
INTRAVENOUS | Status: DC | PRN
  Administered 2019-01-05: 07:00:00 via INTRAVENOUS

## 2019-01-05 MED ORDER — FENTANYL CITRATE (PF) 100 MCG/2ML IJ SOLN
INTRAMUSCULAR | Status: DC | PRN
  Administered 2019-01-05: 25 ug via INTRAVENOUS
  Administered 2019-01-05: 50 ug via INTRAVENOUS

## 2019-01-05 MED ORDER — LIDOCAINE-EPINEPHRINE 1 %-1:100000 IJ SOLN
INTRAMUSCULAR | Status: AC
  Filled 2019-01-05: qty 1

## 2019-01-05 MED ORDER — CEFAZOLIN SODIUM-DEXTROSE 2-4 GM/100ML-% IV SOLN
2.0000 g | INTRAVENOUS | Status: AC
  Administered 2019-01-05: 2 g via INTRAVENOUS
  Filled 2019-01-05: qty 100

## 2019-01-05 MED ORDER — LIDOCAINE 2% (20 MG/ML) 5 ML SYRINGE
INTRAMUSCULAR | Status: DC | PRN
  Administered 2019-01-05: 60 mg via INTRAVENOUS

## 2019-01-05 MED ORDER — ARTIFICIAL TEARS OPHTHALMIC OINT
TOPICAL_OINTMENT | OPHTHALMIC | Status: AC
  Filled 2019-01-05: qty 7

## 2019-01-05 SURGICAL SUPPLY — 33 items
BLADE ROTATE RAD 40 4 M4 (BLADE) ×3 IMPLANT
BLADE ROTATE TRICUT 4X13 M4 (BLADE) ×3 IMPLANT
CANISTER SUCT 3000ML PPV (MISCELLANEOUS) ×6 IMPLANT
COAGULATOR SUCT 8FR VV (MISCELLANEOUS) ×3 IMPLANT
COVER WAND RF STERILE (DRAPES) ×3 IMPLANT
ELECT REM PT RETURN 9FT ADLT (ELECTROSURGICAL) ×3
ELECTRODE REM PT RTRN 9FT ADLT (ELECTROSURGICAL) ×2 IMPLANT
FILTER ARTHROSCOPY CONVERTOR (FILTER) ×3 IMPLANT
GLOVE BIO SURGEON STRL SZ7 (GLOVE) ×6 IMPLANT
GLOVE BIOGEL PI IND STRL 6.5 (GLOVE) ×2 IMPLANT
GLOVE BIOGEL PI IND STRL 7.0 (GLOVE) ×2 IMPLANT
GLOVE BIOGEL PI INDICATOR 6.5 (GLOVE) ×1
GLOVE BIOGEL PI INDICATOR 7.0 (GLOVE) ×1
GLOVE SURG SS PI 6.0 STRL IVOR (GLOVE) ×3 IMPLANT
GLOVE SURG SS PI 7.0 STRL IVOR (GLOVE) ×3 IMPLANT
GOWN STRL REUS W/ TWL LRG LVL3 (GOWN DISPOSABLE) ×2 IMPLANT
GOWN STRL REUS W/TWL LRG LVL3 (GOWN DISPOSABLE) ×1
IV NS 1000ML (IV SOLUTION) ×1
IV NS 1000ML BAXH (IV SOLUTION) ×2 IMPLANT
KIT BASIN OR (CUSTOM PROCEDURE TRAY) ×3 IMPLANT
KIT TURNOVER KIT B (KITS) ×3 IMPLANT
NEEDLE 18GX1X1/2 (RX/OR ONLY) (NEEDLE) ×3 IMPLANT
NEEDLE HYPO 25GX1X1/2 BEV (NEEDLE) ×3 IMPLANT
NS IRRIG 1000ML POUR BTL (IV SOLUTION) ×3 IMPLANT
PAD ARMBOARD 7.5X6 YLW CONV (MISCELLANEOUS) ×6 IMPLANT
SPONGE NEURO XRAY DETECT 1X3 (DISPOSABLE) ×3 IMPLANT
TOWEL GREEN STERILE FF (TOWEL DISPOSABLE) ×3 IMPLANT
TRACKER ENT INSTRUMENT (MISCELLANEOUS) ×3 IMPLANT
TRACKER ENT PATIENT (MISCELLANEOUS) ×3 IMPLANT
TRAY ENT MC OR (CUSTOM PROCEDURE TRAY) ×3 IMPLANT
TUBE CONNECTING 12X1/4 (SUCTIONS) ×3 IMPLANT
TUBING EXTENTION W/L.L. (IV SETS) ×3 IMPLANT
TUBING STRAIGHTSHOT EPS 5PK (TUBING) ×3 IMPLANT

## 2019-01-05 NOTE — Transfer of Care (Signed)
Immediate Anesthesia Transfer of Care Note  Patient: Maria Franklin  Procedure(s) Performed: ENDOSCOPIC SINUS SURGERY WITH NAVIGATION (Bilateral Nose) Turbinate Reduction (N/A Nose)  Patient Location: PACU  Anesthesia Type:General  Level of Consciousness: drowsy and patient cooperative  Airway & Oxygen Therapy: Patient Spontanous Breathing and Patient connected to nasal cannula oxygen  Post-op Assessment: Report given to RN  Post vital signs: Reviewed and stable  Last Vitals:  Vitals Value Taken Time  BP 123/77 01/05/2019  9:22 AM  Temp    Pulse 78 01/05/2019  9:22 AM  Resp 17 01/05/2019  9:22 AM  SpO2 95 % 01/05/2019  9:22 AM  Vitals shown include unvalidated device data.  Last Pain:  Vitals:   01/05/19 0637  TempSrc:   PainSc: 0-No pain         Complications: No apparent anesthesia complications

## 2019-01-05 NOTE — Anesthesia Procedure Notes (Signed)
Procedure Name: Intubation Date/Time: 01/05/2019 7:54 AM Performed by: Barrington Ellison, CRNA Pre-anesthesia Checklist: Patient identified, Emergency Drugs available, Suction available and Patient being monitored Patient Re-evaluated:Patient Re-evaluated prior to induction Oxygen Delivery Method: Circle System Utilized Preoxygenation: Pre-oxygenation with 100% oxygen Induction Type: IV induction Ventilation: Mask ventilation without difficulty Laryngoscope Size: Mac and 3 Grade View: Grade I Tube type: Oral Rae Tube size: 7.5 mm Number of attempts: 1 Airway Equipment and Method: Stylet and Oral airway Placement Confirmation: ETT inserted through vocal cords under direct vision,  positive ETCO2 and breath sounds checked- equal and bilateral Secured at: 21 cm Tube secured with: Tape Dental Injury: Teeth and Oropharynx as per pre-operative assessment

## 2019-01-05 NOTE — Anesthesia Postprocedure Evaluation (Signed)
Anesthesia Post Note  Patient: Maria Franklin  Procedure(s) Performed: ENDOSCOPIC SINUS SURGERY WITH NAVIGATION (Bilateral Nose) Turbinate Reduction (N/A Nose)     Patient location during evaluation: PACU Anesthesia Type: General Level of consciousness: awake and alert Pain management: pain level controlled Vital Signs Assessment: post-procedure vital signs reviewed and stable Respiratory status: spontaneous breathing, nonlabored ventilation, respiratory function stable and patient connected to nasal cannula oxygen Cardiovascular status: blood pressure returned to baseline and stable Postop Assessment: no apparent nausea or vomiting Anesthetic complications: no    Last Vitals:  Vitals:   01/05/19 1040 01/05/19 1045  BP:  138/72  Pulse:    Resp:    Temp: 36.7 C   SpO2:  98%    Last Pain:  Vitals:   01/05/19 1045  TempSrc:   PainSc: 0-No pain                 Kashauna Celmer P Rashmi Tallent

## 2019-01-05 NOTE — H&P (Signed)
Maria Franklin is an 58 y.o. female.   Chief Complaint: Nasal Obstruction  HPI: Hx of nasal polyps  Past Medical History:  Diagnosis Date  . Allergy   . Anemia   . Angio-edema   . Asthma    ALL THE TIME   LAST FLARE UP 12/2018  . Chicken pox   . Diabetes mellitus without complication ([place])   . GERD (gastroesophageal reflux disease)   . Hyperlipidemia   . Hypertension   . Hyperthyroidism   . Recurrent upper respiratory infection (URI)   . Schizophrenia ([place])   . Sleep apnea    DX 2-3 YRS AGO.  Marland Kitchen Thyroid disease   . Urticaria     Past Surgical History:  Procedure Laterality Date  . SINOSCOPY    . SINUS IRRIGATION    . TONSILLECTOMY      Family History  Problem Relation Age of Onset  . Mental illness Mother   . Hypertension Mother   . Schizophrenia Mother   . Depression Mother   . Cancer Father        LUNG  . Cancer Sister        BREAST  . Breast cancer Sister   . Mental illness Brother   . Hypertension Brother   . Schizophrenia Brother   . Depression Brother   . Heart disease Maternal Grandmother   . Depression Maternal Grandmother   . Schizophrenia Maternal Grandmother   . Schizophrenia Brother   . Schizophrenia Maternal Uncle   . Breast cancer Maternal Aunt    Social History:  reports that she has quit smoking. She has a 1.50 pack-year smoking history. She has never used smokeless tobacco. She reports that she does not drink alcohol or use drugs.  Allergies:  Allergies  Allergen Reactions  . Bee Pollen Cough  . Pollen Extract Cough  . Latex Itching, Rash and Other (See Comments)    Severe itching    Medications Prior to Admission  Medication Sig Dispense Refill  . albuterol (VENTOLIN HFA) 108 (90 Base) MCG/ACT inhaler INHALE ONE TO TWO PUFFS INTO LUNGS EVERY 6 HOURS AS NEEDED FOR WHEEZING OR SHORTNESS OF BREATH (Patient taking differently: Inhale 2 puffs into the lungs every 6 (six) hours as needed for wheezing or shortness of breath. ) 18 each 2  .  amLODipine (NORVASC) 10 MG tablet Take 1 tablet (10 mg total) by mouth daily. 90 tablet 1  . atorvastatin (LIPITOR) 10 MG tablet TAKE 1 TABLET BY MOUTH ONCE DAILY AT  6  PM (Patient taking differently: Take 10 mg by mouth daily at 6 PM. ) 90 tablet 1  . BIOTIN PO Take 1 tablet by mouth daily.    . budesonide-formoterol (SYMBICORT) 160-4.5 MCG/ACT inhaler Inhale 2 puffs into the lungs 2 (two) times daily. 1 Inhaler 5  . carvedilol (COREG) 12.5 MG tablet Take 1 tablet (12.5 mg total) by mouth 2 (two) times daily with a meal. 180 tablet 1  . fexofenadine (ALLEGRA) 180 MG tablet Take 1 tablet (180 mg total) by mouth daily. (Patient taking differently: Take 180 mg by mouth every evening. ) 30 tablet 2  . fluticasone (FLONASE) 50 MCG/ACT nasal spray Place 2 sprays into both nostrils daily.    . haloperidol (HALDOL) 1 MG tablet Take 1 tablet (1 mg total) by mouth at bedtime. 90 tablet 1  . hydrALAZINE (APRESOLINE) 10 MG tablet Take 1 tablet (10 mg total) by mouth 2 (two) times daily. 180 tablet 1  . levothyroxine (  SYNTHROID, LEVOTHROID) 50 MCG tablet TAKE 1 TABLET BY MOUTH ONCE DAILY BEFORE BREAKFAST (Patient taking differently: Take 50 mcg by mouth daily before breakfast. ) 90 tablet 1  . lisinopril (PRINIVIL,ZESTRIL) 40 MG tablet TAKE 1 TABLET BY MOUTH ONCE DAILY (Patient taking differently: Take 40 mg by mouth daily. ) 90 tablet 0  . metFORMIN (GLUCOPHAGE-XR) 500 MG 24 hr tablet Take 1,000 mg by mouth every evening.     . montelukast (SINGULAIR) 10 MG tablet Take 10 mg by mouth daily.     . Omega-3 Fatty Acids (FISH OIL PO) Take 1 capsule by mouth daily.    Marland Kitchen omeprazole (PRILOSEC) 40 MG capsule Take 1 capsule (40 mg total) by mouth daily. 90 capsule 1  . albuterol (PROVENTIL) (2.5 MG/3ML) 0.083% nebulizer solution Take 3 mLs (2.5 mg total) by nebulization every 4 (four) hours as needed for wheezing or shortness of breath. (Patient not taking: Reported on 12/27/2018) 75 mL 11  . Apoaequorin (PREVAGEN EXTRA  STRENGTH) 20 MG CAPS Take 20 mg by mouth daily.    Marland Kitchen EPINEPHrine 0.3 mg/0.3 mL IJ SOAJ injection Inject 0.3 mg into the muscle once.       Results for orders placed or performed during the hospital encounter of 01/05/19 (from the past 48 hour(s))  Glucose, capillary     Status: Abnormal   Collection Time: 01/05/19  6:13 AM  Result Value Ref Range   Glucose-Capillary 114 (H) 70 - 99 mg/dL   No results found.  Review of Systems  Constitutional: Negative.   HENT: Positive for congestion.   Respiratory: Positive for cough.   Cardiovascular: Negative.     Blood pressure (!) 150/93, pulse 72, temperature 98.3 F (36.8 C), temperature source Oral, resp. rate 20, height 5' 2.99" (1.6 m), weight 112.4 kg, last menstrual period 02/02/2017, SpO2 95 %. Physical Exam  Constitutional: She appears well-developed and well-nourished.  HENT:  Nasal polypsis  Neck: Normal range of motion. Neck supple.  Cardiovascular: Normal rate.  Respiratory: Effort normal.     Assessment/Plan Adm for OP Bil ESS with polypectomy  Jerrell Belfast, MD 01/05/2019, 7:35 AM

## 2019-01-05 NOTE — Op Note (Signed)
Operative Note:  ENDOSCOPIC SINUS SURGERY WITH NAVIGATION    INFERIOR TURBINATE REDUCTION  Patient: Maria Franklin record number: [id number]  Date:01/05/2019  Pre-operative Indications: 1.  Chronic Sinusitis     2.  Chronic Nasal Polyps     3.  Bilateral inferior turbinate hypertrophy  Postoperative Indications: Same  Surgical Procedure: 1.  Bilateral revision endoscopic sinus surgery with nasal polypectomy    2.  Bilateral Inferior Turbinate Reduction  Anesthesia: GET  Surgeon: Delsa Bern, M.D.  Complications: None  EBL: 50 cc  Findings: Nasal polyposis with obstruction primarily on the patient's right, moderate scarring from previous sinus surgery, no evidence of active infection and bilateral inferior turbinate hypertrophy.  No nasal packing placed.   Brief History: The patient is a 58 y.o. female with a history of chronic sinusitis and turbinate hypertrophy. The patient has been on medical therapy to reduce nasal mucosal edema and infection including antibiotics, saline nasal spray and topical nasal steroids.  The patient has a history of nasal polyposis and underwent prior sinus surgery over 10 years ago.  She has a history of obstructive sleep apnea and wears nightly minified CPAP.  She reports increasing symptoms of nasal airway obstruction and congestion impairing her use of CPAP.  Despite appropriate medical therapy the patient continues to have ongoing symptoms. Given the patient's history and findings, the above surgical procedures were recommended, risks and benefits were discussed in detail with the patient may understand and agree with our plan for surgery which is scheduled at [facility] under general anesthesia as an outpatient.  Surgical Procedure: The patient is brought to the operating room on 01/05/2019 and placed in supine position on the operating table. General endotracheal anesthesia was established without difficulty. When the patient was  adequately anesthetized, surgical timeout was performed with correct identification of the patient and the surgical procedure. The patient's nose was then injected with 6 cc of 1% lidocaine 1:100,000 dilution epinephrine which was injected in a submucosal fashion. The patient's nose was then packed with Afrin-soaked cottonoid pledgets were left in place for approximately 10 minutes to allow for vasoconstriction and hemostasis.  The Xomed Fusion navigation headgear was applied in anatomic and surgical landmarks were identified and confirmed, navigation was used throughout the sinus component of the surgical procedure.  With the patient prepped draped and prepared for surgery, nasal nasal endoscopy was performed on the left side.  The patient undergone prior sinonasal surgery and was relatively patent with polypoid disease affecting the ethmoid and frontal region.  Using a 0 degree endoscope and a straight microdebrider, a total vision ethmoidectomy was performed dissecting from anterior to posterior along the floor of the ethmoid sinus removing bony septations and diseased mucosa.  Polypoid disease in the mid and superior ethmoid region was resected.  Using a 45 degree telescope and a curved microdebrider dissection was then carried out along the roof of the ethmoid sinus with navigation, completing a total ethmoidectomy.  Attention was then turned to the nasal frontal recess and again using a curved microdebrider, navigation and endoscopic visualization the nasal frontal recess was widely open, underlying ethmoid cells and diseased mucosa resected creating a widely patent nasal frontal recess.  The lateral nasal wall was inspected, the maxillary sinus ostium was widely patent and there was no evidence of infection or significant polyps.  The patient's right side was then inspected, the patient had extensive polypoid disease involving the ethmoid, maxillary and frontal sinus region.  Total ethmoidectomy was  performed using a 0 degree telescope and straight microdebrider along the floor of the ethmoid sinus, a 45 degree telescope and curved microdebrider with navigation was used along the roof the ethmoid sinus from posterior to anterior to complete a total ethmoidectomy.  The nasal frontal recess was then explored and underlying ethmoid disease was resected with a curved microdebrider, the nasal frontal recess was widely opened and diseased material was resected from within the sinus.  Attention was then turned to the lateral nasal wall, the natural ostium maxillary sinus was identified. The uncinate process was resected.  Diseased mucosa from within the maxilla sinus was then cleared and the ostium was enlarged in a posterior and inferior direction.    Attention was then turned to the inferior turbinates, bilateral inferior turbinate intramural cautery was performed with cautery setting at 42 W.  2 submucosal passes were made in each inferior turbinate.  After completing cautery, anterior vertical incisions were created and overlying soft tissue was elevated, a small amount of turbinate bone was resected.  The turbinates were then outfractured to create a more patent nasal passageway.  Surgical sponge count was correct. An oral gastric tube was passed and the stomach contents were aspirated. Patient was awakened from anesthetic and transferred from the operating room to the recovery room in stable condition. There were no complications and blood loss was 50 cc.   Delsa Bern, M.D. [facility] ENT 01/05/2019

## 2019-01-07 ENCOUNTER — Encounter: Payer: Self-pay | Admitting: Internal Medicine

## 2019-01-08 ENCOUNTER — Encounter (HOSPITAL_COMMUNITY): Payer: Self-pay | Admitting: Otolaryngology

## 2019-01-09 ENCOUNTER — Other Ambulatory Visit (HOSPITAL_COMMUNITY): Payer: Self-pay

## 2019-01-09 DIAGNOSIS — F200 Paranoid schizophrenia: Secondary | ICD-10-CM

## 2019-01-09 MED ORDER — HALOPERIDOL 1 MG PO TABS: 1.0000 mg | ORAL_TABLET | Freq: Every day | ORAL | 1 refills | Status: DC

## 2019-01-10 DIAGNOSIS — R7309 Other abnormal glucose: Secondary | ICD-10-CM | POA: Diagnosis not present

## 2019-01-10 DIAGNOSIS — E039 Hypothyroidism, unspecified: Secondary | ICD-10-CM | POA: Diagnosis not present

## 2019-01-14 DIAGNOSIS — J4521 Mild intermittent asthma with (acute) exacerbation: Secondary | ICD-10-CM | POA: Diagnosis not present

## 2019-01-17 DIAGNOSIS — Z6841 Body Mass Index (BMI) 40.0 and over, adult: Secondary | ICD-10-CM | POA: Diagnosis not present

## 2019-01-17 DIAGNOSIS — Z8639 Personal history of other endocrine, nutritional and metabolic disease: Secondary | ICD-10-CM | POA: Diagnosis not present

## 2019-01-17 DIAGNOSIS — E039 Hypothyroidism, unspecified: Secondary | ICD-10-CM | POA: Diagnosis not present

## 2019-01-17 DIAGNOSIS — J343 Hypertrophy of nasal turbinates: Secondary | ICD-10-CM | POA: Insufficient documentation

## 2019-01-17 DIAGNOSIS — R7309 Other abnormal glucose: Secondary | ICD-10-CM | POA: Diagnosis not present

## 2019-01-17 DIAGNOSIS — R635 Abnormal weight gain: Secondary | ICD-10-CM | POA: Diagnosis not present

## 2019-01-17 DIAGNOSIS — I10 Essential (primary) hypertension: Secondary | ICD-10-CM | POA: Diagnosis not present

## 2019-01-18 DIAGNOSIS — R7309 Other abnormal glucose: Secondary | ICD-10-CM | POA: Diagnosis not present

## 2019-01-18 DIAGNOSIS — I10 Essential (primary) hypertension: Secondary | ICD-10-CM | POA: Diagnosis not present

## 2019-01-18 DIAGNOSIS — Z8639 Personal history of other endocrine, nutritional and metabolic disease: Secondary | ICD-10-CM | POA: Diagnosis not present

## 2019-01-18 DIAGNOSIS — R635 Abnormal weight gain: Secondary | ICD-10-CM | POA: Diagnosis not present

## 2019-01-19 ENCOUNTER — Other Ambulatory Visit: Payer: Self-pay | Admitting: Internal Medicine

## 2019-02-06 ENCOUNTER — Telehealth: Payer: Self-pay | Admitting: Internal Medicine

## 2019-02-06 DIAGNOSIS — K219 Gastro-esophageal reflux disease without esophagitis: Secondary | ICD-10-CM

## 2019-02-06 DIAGNOSIS — J302 Other seasonal allergic rhinitis: Secondary | ICD-10-CM

## 2019-02-06 NOTE — Telephone Encounter (Signed)
Pt need her medications transferred from [facility] mail order to [facility]    Levothyroxine 50 mcg hydralazine   10mg    Carvedilol 12.5 mg Amlodipine  10 mg Lisinopril  40 mg Atorvastatin  10 mg Omeprazole  40 mg Montelukast  10 mg Albuterol inhaler

## 2019-02-07 ENCOUNTER — Encounter: Payer: Self-pay | Admitting: Neurology

## 2019-02-07 ENCOUNTER — Other Ambulatory Visit: Payer: Self-pay

## 2019-02-07 ENCOUNTER — Ambulatory Visit: Payer: Medicare HMO | Admitting: Neurology

## 2019-02-07 ENCOUNTER — Other Ambulatory Visit (INDEPENDENT_AMBULATORY_CARE_PROVIDER_SITE_OTHER): Payer: Medicare HMO

## 2019-02-07 VITALS — BP 136/94 | HR 77 | Ht 63.0 in | Wt 249.0 lb

## 2019-02-07 DIAGNOSIS — R413 Other amnesia: Secondary | ICD-10-CM | POA: Diagnosis not present

## 2019-02-07 LAB — VITAMIN B12: Vitamin B-12: 1077 pg/mL (ref 200–1100)

## 2019-02-07 LAB — TSH: TSH: 2.39 mIU/L (ref 0.40–4.50)

## 2019-02-07 MED ORDER — LISINOPRIL 40 MG PO TABS: 40.0000 mg | ORAL_TABLET | Freq: Every day | ORAL | 0 refills | Status: DC

## 2019-02-07 MED ORDER — ATORVASTATIN CALCIUM 10 MG PO TABS: 10.0000 mg | ORAL_TABLET | Freq: Every day | ORAL | 0 refills | Status: DC

## 2019-02-07 MED ORDER — AMLODIPINE BESYLATE 10 MG PO TABS: 10.0000 mg | ORAL_TABLET | Freq: Every day | ORAL | 0 refills | Status: DC

## 2019-02-07 MED ORDER — MONTELUKAST SODIUM 10 MG PO TABS: 10.0000 mg | ORAL_TABLET | Freq: Every day | ORAL | 0 refills | Status: DC

## 2019-02-07 MED ORDER — LEVOTHYROXINE SODIUM 50 MCG PO TABS: 50.0000 ug | ORAL_TABLET | Freq: Every day | ORAL | 0 refills | Status: DC

## 2019-02-07 MED ORDER — OMEPRAZOLE 40 MG PO CPDR: 40.0000 mg | DELAYED_RELEASE_CAPSULE | Freq: Every day | ORAL | 0 refills | Status: DC

## 2019-02-07 MED ORDER — ALBUTEROL SULFATE HFA 108 (90 BASE) MCG/ACT IN AERS: INHALATION_SPRAY | RESPIRATORY_TRACT | 1 refills | Status: DC

## 2019-02-07 MED ORDER — HYDRALAZINE HCL 10 MG PO TABS: 10.0000 mg | ORAL_TABLET | Freq: Two times a day (BID) | ORAL | 0 refills | Status: DC

## 2019-02-07 MED ORDER — CARVEDILOL 12.5 MG PO TABS: 12.5000 mg | ORAL_TABLET | Freq: Two times a day (BID) | ORAL | 0 refills | Status: DC

## 2019-02-07 NOTE — Patient Instructions (Addendum)
1. Bloodwork for TSH, B12  Your provider requests that you have LABS drawn today.  We share a lab with [facility] - they are located in [address] (second floor) of this building.  Once you get there, please have a seat and the phlebotomist will call your name.  If you have waited more than 15 minutes, please advise the front desk  2. Schedule MRI brain without contrast  We have sent a referral to [facility] for your MRI and they will call you directly to schedule your appt. They are located at [place]. If you need to contact them directly please call [phone].  3. Refer for Neurocognitive testing at [place] WITH MEMORY PROBLEMS: 1. Continue to exercise (Recommend 30 minutes of walking everyday, or 3 hours every week) 2. Increase social interactions - continue going to [place] and enjoy social gatherings with friends and family 3. Eat healthy, avoid fried foods and eat more fruits and vegetables 4. Maintain adequate blood pressure, blood sugar, and blood cholesterol level. Reducing the risk of stroke and cardiovascular disease also helps promoting better memory. 5. Avoid stressful situations. Live a simple life and avoid aggravations. Organize your time and prepare for the next day in anticipation. 6. Sleep well, avoid any interruptions of sleep and avoid any distractions in the bedroom that may interfere with adequate sleep quality 7. Avoid sugar, avoid sweets as there is a strong link between excessive sugar intake, diabetes, and cognitive impairment The Mediterranean diet has been shown to help patients reduce the risk of progressive memory disorders and reduces cardiovascular risk. This includes eating fish, eat fruits and green leafy vegetables, nuts like almonds and hazelnuts, walnuts, and also use olive oil. Avoid fast foods and fried foods as much as possible. Avoid sweets and sugar as sugar use has been linked to  worsening of memory function.

## 2019-02-07 NOTE — Progress Notes (Signed)
NEUROLOGY CONSULTATION NOTE  [name] MRN: [id number] DOB: 23-Aug-1961  Referring provider: Webb Silversmith, NP Primary care provider: Webb Silversmith, NP  Reason for consult:  Memory loss  Thank you for your kind referral of Maria Franklin for consultation of the above symptoms. Although her history is well known to you, please allow me to reiterate it for the purpose of our medical record. She is alone in the office today. Records and images were personally reviewed where available.  HISTORY OF PRESENT ILLNESS: This is a pleasant 58 year old right-handed woman with a history of hypertension, hyperlipidemia, sleep apnea, paranoid schizophrenia, presenting for evaluation of memory loss. She states she was diagnosed with schizophrenia in 2006 or 2007 and started noticing memory changes soon after. Memory issues have progressively worsened that she is now concerned. She states that everything is changing, things are just not the same. 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 Now she cannot remember anything about teams and players and cannot keep up like she used to, making it harder trying to relate with her son during football conversations. She cannot remember if she already watched a show or movie. She takes notes in church and has difficulty finishing the note trying to remember what the pastor said. When she cooks, she forgets she put something on the stove. She has missed some bills because there are so many, but thinks she is doing good with it, writing things down and keeping a calendar. She forgets her medications, especially the evening one. One time while driving 5-45 months ago, she wondered where she was and where she was going, then reoriented when she saw the exit. She denies misplacing things frequently or being told she repeats herself. There is no family history of dementia.  There is a strong family history of schizophrenia in her mother, brother, and maternal grandmother. No history of significant head injuries. She rarely drinks alcohol.  She denies any headaches, dizziness, diplopia, dysarthria/dysphagia, neck/back pain, focal numbness/tingling/weakness, bowel/bladder dysfunction, anosmia, or tremors. No falls. She has occasional paranoia thinking her husband has told someone something she said, she usually takes extra medication and sees her psychiatrist. These usually occur when she is stressed out. She is currently unemployed. Sleep is good.   PAST MEDICAL HISTORY: Past Medical History:  Diagnosis Date  . Allergy   . Anemia   . Angio-edema   . Asthma    ALL THE TIME   LAST FLARE UP 12/2018  . Chicken pox   . Diabetes mellitus without complication ([place])   . GERD (gastroesophageal reflux disease)   . Hyperlipidemia   . Hypertension   . Hyperthyroidism   . Recurrent upper respiratory infection (URI)   . Schizophrenia ([place])   . Sleep apnea    DX 2-3 YRS AGO.  Marland Kitchen Thyroid disease   . Urticaria     PAST SURGICAL HISTORY: Past Surgical History:  Procedure Laterality Date  . SINOSCOPY    . SINUS ENDO W/FUSION Bilateral 01/05/2019   Procedure: ENDOSCOPIC SINUS SURGERY WITH NAVIGATION;  Surgeon: Jerrell Belfast, MD;  Location: [facility];  Service: ENT;  Laterality: Bilateral;  . SINUS IRRIGATION    . TONSILLECTOMY    . TURBINATE REDUCTION N/A 01/05/2019   Procedure: Turbinate Reduction;  Surgeon: Jerrell Belfast, MD;  Location: [facility];  Service: ENT;  Laterality: N/A;    MEDICATIONS: Current  Outpatient Medications on File Prior to Visit  Medication Sig Dispense Refill  . albuterol (PROVENTIL) (2.5 MG/3ML) 0.083% nebulizer solution Take 3 mLs (2.5 mg total) by nebulization every 4 (four) hours as needed for wheezing or shortness of breath. (Patient not taking: Reported on 12/27/2018) 75 mL 11  . albuterol (VENTOLIN HFA) 108 (90 Base) MCG/ACT inhaler INHALE  ONE TO TWO PUFFS INTO LUNGS EVERY 6 HOURS AS NEEDED FOR WHEEZING OR SHORTNESS OF BREATH (Patient taking differently: Inhale 2 puffs into the lungs every 6 (six) hours as needed for wheezing or shortness of breath. ) 18 each 2  . amLODipine (NORVASC) 10 MG tablet TAKE 1 TABLET DAILY 90 tablet 0  . Apoaequorin (PREVAGEN EXTRA STRENGTH) 20 MG CAPS Take 20 mg by mouth daily.    Marland Kitchen atorvastatin (LIPITOR) 10 MG tablet TAKE 1 TABLET BY MOUTH ONCE DAILY AT  6  PM (Patient taking differently: Take 10 mg by mouth daily at 6 PM. ) 90 tablet 1  . BIOTIN PO Take 1 tablet by mouth daily.    . budesonide-formoterol (SYMBICORT) 160-4.5 MCG/ACT inhaler Inhale 2 puffs into the lungs 2 (two) times daily. 1 Inhaler 5  . carvedilol (COREG) 12.5 MG tablet Take 1 tablet (12.5 mg total) by mouth 2 (two) times daily with a meal. MUST SCHEDULE ANNUAL PHYSICAL 180 tablet 0  . EPINEPHrine 0.3 mg/0.3 mL IJ SOAJ injection Inject 0.3 mg into the muscle once.     . fexofenadine (ALLEGRA) 180 MG tablet Take 1 tablet (180 mg total) by mouth daily. (Patient taking differently: Take 180 mg by mouth every evening. ) 30 tablet 2  . haloperidol (HALDOL) 1 MG tablet Take 1 tablet (1 mg total) by mouth at bedtime. 90 tablet 1  . hydrALAZINE (APRESOLINE) 10 MG tablet Take 1 tablet (10 mg total) by mouth 2 (two) times daily. MUST SCHEDULE ANNUAL PHYSICAL 180 tablet 0  . levothyroxine (SYNTHROID, LEVOTHROID) 50 MCG tablet Take 1 tablet (50 mcg total) by mouth daily before breakfast. MUST SCHEDULE ANNUAL PHYSICAL 90 tablet 0  . lisinopril (PRINIVIL,ZESTRIL) 40 MG tablet TAKE 1 TABLET BY MOUTH ONCE DAILY (Patient taking differently: Take 40 mg by mouth daily. ) 90 tablet 0  . metFORMIN (GLUCOPHAGE-XR) 500 MG 24 hr tablet Take 1,000 mg by mouth every evening.     . montelukast (SINGULAIR) 10 MG tablet Take 10 mg by mouth daily.     . Omega-3 Fatty Acids (FISH OIL PO) Take 1 capsule by mouth daily.    Marland Kitchen omeprazole (PRILOSEC) 40 MG capsule Take 1  capsule (40 mg total) by mouth daily. 90 capsule 1   No current facility-administered medications on file prior to visit.     ALLERGIES: Allergies  Allergen Reactions  . Bee Pollen Cough  . Pollen Extract Cough  . Latex Itching, Rash and Other (See Comments)    Severe itching    FAMILY HISTORY: Family History  Problem Relation Age of Onset  . Mental illness Mother   . Hypertension Mother   . Schizophrenia Mother   . Depression Mother   . Cancer Father        LUNG  . Cancer Sister        BREAST  . Breast cancer Sister   . Mental illness Brother   . Hypertension Brother   . Schizophrenia Brother   . Depression Brother   . Heart disease Maternal Grandmother   . Depression Maternal Grandmother   . Schizophrenia Maternal Grandmother   .  Schizophrenia Brother   . Schizophrenia Maternal Uncle   . Breast cancer Maternal Aunt     SOCIAL HISTORY: Social History   Socioeconomic History  . Marital status: Married    Spouse name: Not on file  . Number of children: Not on file  . Years of education: Not on file  . Highest education level: Not on file  Occupational History  . Not on file  Social Needs  . Financial resource strain: Not on file  . Food insecurity:    Worry: Not on file    Inability: Not on file  . Transportation needs:    Medical: Not on file    Non-medical: Not on file  Tobacco Use  . Smoking status: Former Smoker    Packs/day: 0.25    Years: 6.00    Pack years: 1.50  . Smokeless tobacco: Never Used  . Tobacco comment: QUIT IN HER EARLY 20'S  Substance and Sexual Activity  . Alcohol use: No    Alcohol/week: 0.0 standard drinks  . Drug use: No  . Sexual activity: Not Currently  Lifestyle  . Physical activity:    Days per week: Not on file    Minutes per session: Not on file  . Stress: Not on file  Relationships  . Social connections:    Talks on phone: Not on file    Gets together: Not on file    Attends religious service: Not on file     Active member of club or organization: Not on file    Attends meetings of clubs or organizations: Not on file    Relationship status: Not on file  . Intimate partner violence:    Fear of current or ex partner: Not on file    Emotionally abused: Not on file    Physically abused: Not on file    Forced sexual activity: Not on file  Other Topics Concern  . Not on file  Social History Narrative   3 CHILDREN   RECENTLY MOVED TO [place]   SEEKING EMPLOYMENT    REVIEW OF SYSTEMS: Constitutional: No fevers, chills, or sweats, no generalized fatigue, change in appetite Eyes: No visual changes, double vision, eye pain Ear, nose and throat: No hearing loss, ear pain, nasal congestion, sore throat Cardiovascular: No chest pain, palpitations Respiratory:  No shortness of breath at rest or with exertion, wheezes GastrointestinaI: No nausea, vomiting, diarrhea, abdominal pain, fecal incontinence Genitourinary:  No dysuria, urinary retention or frequency Musculoskeletal:  No neck pain, back pain Integumentary: No rash, pruritus, skin lesions Neurological: as above Psychiatric: No depression, insomnia, anxiety Endocrine: No palpitations, fatigue, diaphoresis, mood swings, change in appetite, change in weight, increased thirst Hematologic/Lymphatic:  No anemia, purpura, petechiae. Allergic/Immunologic: no itchy/runny eyes, nasal congestion, recent allergic reactions, rashes  PHYSICAL EXAM: Vitals:   02/07/19 0910  BP: (!) 136/94  Pulse: 77  SpO2: 97%   General: No acute distress Head:  Normocephalic/atraumatic Eyes: Fundoscopic exam shows bilateral sharp discs, no vessel changes, exudates, or hemorrhages Neck: supple, no paraspinal tenderness, full range of motion Back: No paraspinal tenderness Heart: regular rate and rhythm Lungs: Clear to auscultation bilaterally. Vascular: No carotid bruits. Skin/Extremities: No rash, no edema Neurological Exam: Mental status: alert and  oriented to person, place, and time, no dysarthria or aphasia, Fund of knowledge is appropriate.  Recent and remote memory are intact.  Attention and concentration are normal.    Able to name objects and repeat phrases.  Montreal Cognitive Assessment  02/07/2019  Visuospatial/ Executive (0/5) 4  Naming (0/3) 3  Attention: Read list of digits (0/2) 2  Attention: Read list of letters (0/1) 1  Attention: Serial 7 subtraction starting at 100 (0/3) 2  Language: Repeat phrase (0/2) 2  Language : Fluency (0/1) 1  Abstraction (0/2) 2  Delayed Recall (0/5) 3  Orientation (0/6) 6  Total 26   Cranial nerves: CN I: not tested CN II: pupils equal, round and reactive to light, visual fields intact, fundi unremarkable. CN III, IV, VI:  full range of motion, no nystagmus, no ptosis CN V: facial sensation intact CN VII: upper and lower face symmetric CN VIII: hearing intact to finger rub CN IX, X: gag intact, uvula midline CN XI: sternocleidomastoid and trapezius muscles intact CN XII: tongue midline Bulk & Tone: normal, no fasciculations. Motor: 5/5 throughout with no pronator drift. Sensation: intact to light touch, cold, pin, vibration and joint position sense.  No extinction to double simultaneous stimulation.  Romberg test negative Deep Tendon Reflexes: +2 throughout, no ankle clonus Plantar responses: downgoing bilaterally Cerebellar: no incoordination on finger to nose, heel to shin. No dysdiadochokinesia Gait: narrow-based and steady, able to tandem walk adequately. Tremor: none  IMPRESSION: This is a pleasant 58 year old right-handed woman with a history of hypertension, hyperlipidemia, sleep apnea, paranoid schizophrenia, presenting for evaluation of worsening memory. Her neurological exam is non-focal, MOCA score today within normal limits 26/30. We discussed different causes of memory loss. Check TSH and B12. MRI brain without contrast will be ordered to assess for underlying structural  abnormality and assess vascular load. We discussed how memory impairment could also be due to her underlying psychiatric illness or psychoactive medications. Neurocognitive testing would be helpful to further evaluate these concerns. We discussed the importance of control of vascular risk factors, physical exercise, and brain stimulation exercises for brain health. Our office will call with results, if normal, continue follow-up with Psychiatry and follow-up as needed.   Thank you for allowing me to participate in the care of this patient. Please do not hesitate to call for any questions or concerns.   Ellouise Newer, M.D.  CC: Webb Silversmith, NP, Dr. Adele Schilder

## 2019-02-07 NOTE — Telephone Encounter (Signed)
Rx sent through e-scribe  

## 2019-02-10 ENCOUNTER — Other Ambulatory Visit: Payer: Self-pay | Admitting: Internal Medicine

## 2019-02-10 DIAGNOSIS — K219 Gastro-esophageal reflux disease without esophagitis: Secondary | ICD-10-CM

## 2019-02-12 DIAGNOSIS — J4521 Mild intermittent asthma with (acute) exacerbation: Secondary | ICD-10-CM | POA: Diagnosis not present

## 2019-02-13 ENCOUNTER — Ambulatory Visit: Payer: Medicare HMO

## 2019-02-14 ENCOUNTER — Telehealth: Payer: Self-pay | Admitting: Neurology

## 2019-02-14 DIAGNOSIS — R7309 Other abnormal glucose: Secondary | ICD-10-CM | POA: Diagnosis not present

## 2019-02-14 DIAGNOSIS — E039 Hypothyroidism, unspecified: Secondary | ICD-10-CM | POA: Diagnosis not present

## 2019-02-14 DIAGNOSIS — R635 Abnormal weight gain: Secondary | ICD-10-CM | POA: Diagnosis not present

## 2019-02-14 DIAGNOSIS — Z8639 Personal history of other endocrine, nutritional and metabolic disease: Secondary | ICD-10-CM | POA: Diagnosis not present

## 2019-02-14 DIAGNOSIS — I10 Essential (primary) hypertension: Secondary | ICD-10-CM | POA: Diagnosis not present

## 2019-02-14 DIAGNOSIS — E2601 Conn's syndrome: Secondary | ICD-10-CM | POA: Diagnosis not present

## 2019-02-14 DIAGNOSIS — Z6841 Body Mass Index (BMI) 40.0 and over, adult: Secondary | ICD-10-CM | POA: Diagnosis not present

## 2019-02-14 NOTE — Telephone Encounter (Signed)
Patient notified that referral has been sent.

## 2019-02-14 NOTE — Telephone Encounter (Signed)
Patient is calling about [facility] testing referral. She hasnt heard from them and just wants to make sure the referral was sent. Please confirm with patient. Thanks!

## 2019-02-17 ENCOUNTER — Ambulatory Visit
Admission: RE | Admit: 2019-02-17 | Discharge: 2019-02-17 | Disposition: A | Discharge status: Home / Self Care | Payer: Medicare HMO | Source: Ambulatory Visit | Attending: Neurology | Admitting: Neurology

## 2019-02-17 ENCOUNTER — Other Ambulatory Visit: Payer: Self-pay

## 2019-02-17 DIAGNOSIS — R413 Other amnesia: Secondary | ICD-10-CM | POA: Diagnosis not present

## 2019-02-19 ENCOUNTER — Telehealth: Payer: Self-pay

## 2019-02-19 NOTE — Telephone Encounter (Signed)
-----   Message from Cameron Sprang, MD sent at 02/19/2019  9:04 AM EDT ----- Pls let her know the brain MRI looks good, no evidence of tumor, stroke, or bleed. It showed sinus changes, pls have her speak to PCP if this is causing her problems. Thanks

## 2019-02-19 NOTE — Telephone Encounter (Signed)
Spoke with pt relaying results below.  Pt states that she recently had surgery on her sinuses and will mention mri during her post op appointment with ENT

## 2019-02-20 NOTE — Progress Notes (Unsigned)
LOV note (pt only sen in office once) and MRI report printed and faxed to [facility] at [phone]

## 2019-02-20 NOTE — Progress Notes (Unsigned)
Received notice from [facility] about pt's followign appointments with Dr. Cecelia Byars:  March 25 @ 10am - interview March 25 @ 1pm - Testing April 9 @ 11am - feedback of results.  Notice also requests LOV notes x3 as well as recent imaging reports.

## 2019-02-22 ENCOUNTER — Other Ambulatory Visit (HOSPITAL_COMMUNITY): Payer: Self-pay | Admitting: Internal Medicine

## 2019-02-22 ENCOUNTER — Other Ambulatory Visit: Payer: Self-pay | Admitting: Internal Medicine

## 2019-02-22 DIAGNOSIS — E2689 Other hyperaldosteronism: Principal | ICD-10-CM

## 2019-02-22 DIAGNOSIS — E269 Hyperaldosteronism, unspecified: Secondary | ICD-10-CM

## 2019-02-22 DIAGNOSIS — I10 Essential (primary) hypertension: Secondary | ICD-10-CM

## 2019-02-26 ENCOUNTER — Telehealth: Payer: Self-pay

## 2019-02-26 NOTE — Telephone Encounter (Signed)
See if she wants to do a phone visit. Otherwise, ibuprofen 400 mg TID prn, salt water gargles, chloraseptic spray and antihistamine like Allegra.

## 2019-02-26 NOTE — Telephone Encounter (Signed)
Sore throat since 3-11-120. Had other URI symptoms but they have subsided. Asking what she should do about the sore throat.

## 2019-02-27 NOTE — Telephone Encounter (Signed)
Left detailed msg on VM per HIPAA Offer pt to try recommended OTC treatment for a few days and call for [facility] visit, or schedule phone visit now to talk more with Rollene Fare about her current Sx

## 2019-02-28 ENCOUNTER — Other Ambulatory Visit: Payer: Self-pay | Admitting: Internal Medicine

## 2019-02-28 ENCOUNTER — Telehealth: Payer: Self-pay | Admitting: Pulmonary Disease

## 2019-02-28 DIAGNOSIS — E269 Hyperaldosteronism, unspecified: Secondary | ICD-10-CM

## 2019-02-28 DIAGNOSIS — I10 Essential (primary) hypertension: Secondary | ICD-10-CM

## 2019-02-28 DIAGNOSIS — E2689 Other hyperaldosteronism: Principal | ICD-10-CM

## 2019-02-28 DIAGNOSIS — R413 Other amnesia: Secondary | ICD-10-CM | POA: Diagnosis not present

## 2019-02-28 NOTE — Telephone Encounter (Signed)
Maria Franklin please advise, thank you.

## 2019-02-28 NOTE — Telephone Encounter (Signed)
She quit using machine because it was causing sinus issues. She had sinus surgery in Jan of this year. She has had some memory issues and neurology stated she needs to start her machine again.  She is calling to get supplies, however it has been almost 3 years since she has been seen. Will route to Dr. Alva Garnet to see if needs to be face to face apt or if he can do a phone visit with the Covid precautions.

## 2019-03-02 NOTE — Telephone Encounter (Signed)
Per Dr. Alva Garnet verbally- contact [employer] to see if pt can be provided with cpap supplies until sleep study can be preformed. Called and spoke to [place] with [employer], who states she will contact management regarding this matter.  Pt is aware that we are currently awaiting response from [employer].

## 2019-03-02 NOTE — Telephone Encounter (Signed)
Schedule next available or when she is willing to come in  Thanks  Marmet

## 2019-03-02 NOTE — Telephone Encounter (Signed)
Pt called in reference to appt.

## 2019-03-02 NOTE — Telephone Encounter (Signed)
Pt is returning call. Cb is [phone].

## 2019-03-02 NOTE — Telephone Encounter (Addendum)
Maria Franklin states per cpap management pt will need to have a face to face and repeat sleep study, as pt has not used her machine since 2018 or gotten supplies.  DS please advise .

## 2019-03-02 NOTE — Telephone Encounter (Signed)
lmtcb for pt.  

## 2019-03-02 NOTE — Telephone Encounter (Addendum)
Pt is aware of below message and voiced her understanding.  Pt has been scheduled for OV on 06/04/2019, due to covid-19. Sleep lab is currently closed.  Nothing further is needed at this time.

## 2019-03-05 ENCOUNTER — Ambulatory Visit
Admission: RE | Admit: 2019-03-05 | Discharge: 2019-03-05 | Disposition: A | Discharge status: Home / Self Care | Payer: Medicare HMO | Source: Ambulatory Visit | Attending: Internal Medicine | Admitting: Internal Medicine

## 2019-03-05 ENCOUNTER — Other Ambulatory Visit: Payer: Self-pay

## 2019-03-05 DIAGNOSIS — E2689 Other hyperaldosteronism: Secondary | ICD-10-CM | POA: Diagnosis not present

## 2019-03-05 DIAGNOSIS — E269 Hyperaldosteronism, unspecified: Secondary | ICD-10-CM

## 2019-03-05 DIAGNOSIS — I10 Essential (primary) hypertension: Secondary | ICD-10-CM

## 2019-03-05 MED ORDER — IOPAMIDOL (ISOVUE-300) INJECTION 61%
125.0000 mL | Freq: Once | INTRAVENOUS | Status: AC | PRN
  Administered 2019-03-05: 125 mL via INTRAVENOUS

## 2019-03-07 DIAGNOSIS — I10 Essential (primary) hypertension: Secondary | ICD-10-CM | POA: Diagnosis not present

## 2019-03-09 ENCOUNTER — Telehealth: Payer: Self-pay | Admitting: Neurology

## 2019-03-09 ENCOUNTER — Telehealth: Payer: Self-pay | Admitting: Allergy & Immunology

## 2019-03-09 NOTE — Telephone Encounter (Signed)
Dr. Gallagher please advise.  

## 2019-03-09 NOTE — Telephone Encounter (Signed)
Patient is calling to say she is stopping her [place] for now She had some issues going on, and didn't know if it was coming from that medication And she is nervous about the COVID19 as well - so she stopped the medication  Patient also stats that she was NOT approved for the assistance with SYMBICORT Was told to call back if she wanted and let the physician know

## 2019-03-09 NOTE — Telephone Encounter (Signed)
Can we schedule her for a telephone visit to discuss her concerns?  Salvatore Marvel, MD [facility]

## 2019-03-09 NOTE — Telephone Encounter (Signed)
A tele visit has been schedule for 03/12/2019.

## 2019-03-09 NOTE — Telephone Encounter (Signed)
Results from Telehealth Neurobehavioral Status exam done at [facility] on 02/28/2019 were reviewed:  Impression: Cognitive Diagnosis deferred; other signs and symptoms involving cognition.  Initial impressions are that patient is generally cognitively intact. 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 Her current test performances are within normal limits. She demonstrated mild fluctuations in concentration and word finding ability on a verbal fluency test, and the need or slight repetition to aid free recall during memory tasks. There was no evidence of dementia. Her mild cognitive inefficiencies are likely related to her vascular risk factors, the most important of which is untreated sleep apnea at this time.  Her psychiatric diagnosis of paranoid schizophrenia does not appear to be a significant contributory factor, nor does her psychiatric medication. Her symptoms have been mild and are well controlled at present.

## 2019-03-12 ENCOUNTER — Encounter: Payer: Self-pay | Admitting: Allergy & Immunology

## 2019-03-12 ENCOUNTER — Other Ambulatory Visit: Payer: Self-pay

## 2019-03-12 ENCOUNTER — Ambulatory Visit (INDEPENDENT_AMBULATORY_CARE_PROVIDER_SITE_OTHER): Payer: Medicare HMO | Admitting: Allergy & Immunology

## 2019-03-12 DIAGNOSIS — I10 Essential (primary) hypertension: Secondary | ICD-10-CM | POA: Diagnosis not present

## 2019-03-12 DIAGNOSIS — Z8639 Personal history of other endocrine, nutritional and metabolic disease: Secondary | ICD-10-CM | POA: Diagnosis not present

## 2019-03-12 DIAGNOSIS — J302 Other seasonal allergic rhinitis: Secondary | ICD-10-CM | POA: Diagnosis not present

## 2019-03-12 DIAGNOSIS — J4540 Moderate persistent asthma, uncomplicated: Secondary | ICD-10-CM

## 2019-03-12 DIAGNOSIS — R7309 Other abnormal glucose: Secondary | ICD-10-CM | POA: Diagnosis not present

## 2019-03-12 DIAGNOSIS — Z6841 Body Mass Index (BMI) 40.0 and over, adult: Secondary | ICD-10-CM | POA: Diagnosis not present

## 2019-03-12 DIAGNOSIS — E2601 Conn's syndrome: Secondary | ICD-10-CM | POA: Diagnosis not present

## 2019-03-12 DIAGNOSIS — J3089 Other allergic rhinitis: Secondary | ICD-10-CM | POA: Diagnosis not present

## 2019-03-12 DIAGNOSIS — R635 Abnormal weight gain: Secondary | ICD-10-CM | POA: Diagnosis not present

## 2019-03-12 DIAGNOSIS — E039 Hypothyroidism, unspecified: Secondary | ICD-10-CM | POA: Diagnosis not present

## 2019-03-12 MED ORDER — FLUTICASONE PROPIONATE HFA 110 MCG/ACT IN AERO: 2.0000 | INHALATION_SPRAY | Freq: Two times a day (BID) | RESPIRATORY_TRACT | 5 refills | Status: DC

## 2019-03-12 NOTE — Patient Instructions (Addendum)
1. Seasonal and perennial allergic rhinitis - Continue with the Flonase two sprays per nostril daily.  - Continue Astelin two sprays per nostril up to twice daily.  - Continue with Allegra one tablet daily (can take an extra if you have a particularly bad day).  - Continue with Singulair (montelukast) 10mg  daily.   2. Moderate persistent asthma - We are going to stop the Symbicort and start Flovent two puffs twice daily instead. - Hopefully this will be less expensive than the Symbicort.  - Daily controller medication(s): Flovent 175mcg 2 puffs twice daily with spacer - Prior to physical activity: albuterol 2 puffs 10-15 minutes before physical activity. - Rescue medications: albuterol 4 puffs every 4-6 hours as needed or albuterol nebulizer one vial every 4-6 hours as needed - Asthma control goals:  * Full participation in all desired activities (may need albuterol before activity) * Albuterol use two time or less a week on average (not counting use with activity) * Cough interfering with sleep two time or less a month * Oral steroids no more than once a year * No hospitalizations  3. Return in about 3 months (around 06/11/2019).   Please inform us of any Emergency Department visits, hospitalizations, or changes in symptoms. Call us before going to the ED for breathing or allergy symptoms since we might be able to fit you in for a sick visit. Feel free to contact us anytime with any questions, problems, or concerns.  It was a pleasure to meet you today!  Websites that have reliable patient information: 1. American Academy of Asthma, Allergy, and Immunology: www.aaaai.org 2. Food Allergy Research and Education (FARE): foodallergy.org 3. Mothers of Asthmatics: http://www.asthmacommunitynetwork.org 4. American College of Allergy, Asthma, and Immunology: MonthlyElectricBill.co.uk   Make sure you are registered to vote! If you have moved or changed any of your contact information, you will need to get  this updated before voting!

## 2019-03-12 NOTE — Progress Notes (Signed)
RE: Maria Franklin MRN: [id number] DOB: 22-May-1961 Date of Telemedicine Visit: 03/12/2019  Referring provider: Jearld Fenton, NP Primary care provider: Jearld Fenton, NP  Chief Complaint: Breathing Problem   Telemedicine Follow Up Visit via WebEx: I connected with [name] for a follow up on 03/12/19 by telephone and verified that I am speaking with the correct person using two identifiers.   I discussed the limitations, risks, security and privacy concerns of performing an evaluation and management service by telemedicine and the availability of in person appointments. I also discussed with the patient that there may be a patient responsible charge related to this service. The patient expressed understanding and agreed to proceed.  Patient is at home accompanied by no one who provided/contributed to the history.  Provider is at the office.  Visit start time: 3:23 PM Visit end time: 3:53 PM Insurance consent/check in by: [facility] consent and medical assistant/nurse: Logan  History of Present Illness:  She is a 58 y.o. female, who is being followed for persistent asthma and allergic rhinitis. Her previous allergy office visit was in January 2020 with Dr. Ernst Bowler.  At her last visit, she continued to have problems with her asthma.  Her spirometry continue to look bad, but she was nearly normalizing with the nebulizer treatment.  She was having some issues affording her medication, so we had her fill out some paperwork with [employer].  We also recommended starting Dupixent for control of her asthma as well as her nasal polyposis.  For her allergies, we held off on allergy shots.  We continued with Flonase 2 sprays per nostril daily, Astelin 2 sprays per nostril up to twice daily, Allegra, and Singulair.  For her asthma, we continued Symbicort 160/4.5 mcg 2 puffs twice daily as well as albuterol as needed.  In the interim, she ended up stopping her Dupixent due to some  side effects.  She was also very worried about the COVID-19 pandemic. She kept having some problems with fatigue. She was unsure whether this was related to a cold or the flu or if it was coming from the shot. She was feeling the fatigue just about every time that she had an injection. She had her first dose sent to her home in March 2020. She had her first dose and she developed some fatigue and sore throat that occurred around the same time. She also reports that she had cold sores in her mouth. She was reporting some chills as well. Therefore she was concerned that this was related to [address]. She is also concerned that this was related to the [place].   Asthma/Respiratory Symptom History: She does not think that the addition of the [place] helped with her asthma. She did have a breathing treatment a few weeks after the last visit in January. Symptoms are acting up more. She did apply to [place] [employer] and this was denied. Ruthe Mannan and ADvair are the same price as the Symbicort with her insurance. She does think that the Advair worked better. She has been on the Symbicort  Allergic Rhinitis Symptom History: She has been staying indoors. She does have some tightness in her chest if there is no AC on. She does have problems walking outside. She remaisn on the fluticasone one spray per nostril twice daily.   Otherwise, there have been no changes to her past medical history, surgical history, family history, or social history.  Assessment and Plan:  Nastashia is a 58 y.o. female with:  Seasonal and perennial allergic rhinitis (grasses, weeds, trees, indoor and outdoor molds, dust mite, roach)  Moderate persistent asthma without complication - benefit with Dupixent  Nasal polyposis - s/p two sinus surgeries (1990s and 2013)   Ms. Cragin presents for a follow up visit for her multisystemic atopic disease.  She received her loading dose of Dupixent and then did not receive another one for 3 months.  It is  unclear whether this helped at all.  She remains on the Symbicort, but as before she is not using it regularly in order to ration it.  She was denied for assistance through [place] [employer].  I did review her formulary, at least according to epic, and Symbicort as well as Advair are both tier 1.  Therefore, they are the cheapest that she is going to find.  We will try to decrease her down to an inhaled steroid only, hoping that this will be a cheaper co-pay.  She apparently cannot even afford the $50 co-pay for her medications.  We will see how it goes with Flovent and I did ask her to call us to let us know how much this was going to be.  She is refusing to continue with Dupixent, although I continue to feel that more regular use of it will help with her overall symptom control.    1. Seasonal and perennial allergic rhinitis - Continue with the Flonase two sprays per nostril daily.  - Continue Astelin two sprays per nostril up to twice daily.  - Continue with Allegra one tablet daily (can take an extra if you have a particularly bad day).  - Continue with Singulair (montelukast) 10mg  daily.   2. Moderate persistent asthma - We are going to stop the Symbicort and start Flovent two puffs twice daily instead. - Hopefully this will be less expensive than the Symbicort.  - Daily controller medication(s): Flovent 175mcg 2 puffs twice daily with spacer - Prior to physical activity: albuterol 2 puffs 10-15 minutes before physical activity. - Rescue medications: albuterol 4 puffs every 4-6 hours as needed or albuterol nebulizer one vial every 4-6 hours as needed - Asthma control goals:  * Full participation in all desired activities (may need albuterol before activity) * Albuterol use two time or less a week on average (not counting use with activity) * Cough interfering with sleep two time or less a month * Oral steroids no more than once a year * No hospitalizations  3. Return in about 3 months (around  06/11/2019).     Diagnostics: None.  Medication List:  Current Outpatient Medications  Medication Sig Dispense Refill  . albuterol (PROVENTIL) (2.5 MG/3ML) 0.083% nebulizer solution Take 3 mLs (2.5 mg total) by nebulization every 4 (four) hours as needed for wheezing or shortness of breath. 75 mL 11  . albuterol (VENTOLIN HFA) 108 (90 Base) MCG/ACT inhaler INHALE ONE TO TWO PUFFS INTO LUNGS EVERY 6 HOURS AS NEEDED FOR WHEEZING OR SHORTNESS OF BREATH 18 each 1  . amLODipine (NORVASC) 10 MG tablet Take 1 tablet (10 mg total) by mouth daily. MUST SCHEDULE PHYSICAL EXAM 90 tablet 0  . Apoaequorin (PREVAGEN EXTRA STRENGTH) 20 MG CAPS Take 20 mg by mouth daily.    Marland Kitchen atorvastatin (LIPITOR) 10 MG tablet TAKE 1 TABLET DAILY AT 6PM 90 tablet 0  . BIOTIN PO Take 1 tablet by mouth daily.    . budesonide-formoterol (SYMBICORT) 160-4.5 MCG/ACT inhaler Inhale 2 puffs into the lungs 2 (two) times daily. 1 Inhaler  5  . carvedilol (COREG) 12.5 MG tablet Take 1 tablet (12.5 mg total) by mouth 2 (two) times daily with a meal. MUST SCHEDULE ANNUAL PHYSICAL 180 tablet 0  . EPINEPHrine 0.3 mg/0.3 mL IJ SOAJ injection Inject 0.3 mg into the muscle once.     . haloperidol (HALDOL) 1 MG tablet Take 1 tablet (1 mg total) by mouth at bedtime. 90 tablet 1  . hydrALAZINE (APRESOLINE) 10 MG tablet Take 1 tablet (10 mg total) by mouth 2 (two) times daily. MUST SCHEDULE ANNUAL PHYSICAL 180 tablet 0  . levothyroxine (SYNTHROID, LEVOTHROID) 50 MCG tablet Take 1 tablet (50 mcg total) by mouth daily before breakfast. MUST SCHEDULE ANNUAL PHYSICAL 90 tablet 0  . lisinopril (PRINIVIL,ZESTRIL) 40 MG tablet Take 1 tablet (40 mg total) by mouth daily. MUST SCHEDULE PHYSICAL EXAM 90 tablet 0  . metFORMIN (GLUCOPHAGE-XR) 500 MG 24 hr tablet Take 1,000 mg by mouth every evening.     . montelukast (SINGULAIR) 10 MG tablet Take 1 tablet (10 mg total) by mouth daily. MUST SCHEDULE PHYSICAL EXAM 90 tablet 0  . Omega-3 Fatty Acids (FISH OIL  PO) Take 1 capsule by mouth daily.    Marland Kitchen omeprazole (PRILOSEC) 40 MG capsule TAKE 1 CAPSULE DAILY 90 capsule 0  . fluticasone (FLOVENT HFA) 110 MCG/ACT inhaler Inhale 2 puffs into the lungs 2 (two) times daily. 1 Inhaler 5   No current facility-administered medications for this visit.    Allergies: Allergies  Allergen Reactions  . Bee Pollen Cough  . Pollen Extract Cough  . Latex Itching, Rash and Other (See Comments)    Severe itching   I reviewed her past medical history, social history, family history, and environmental history and no significant changes have been reported from previous visits.  Review of Systems  Constitutional: Negative for activity change, appetite change, chills, fatigue and fever.  HENT: Negative for congestion, postnasal drip, rhinorrhea, sinus pressure and sore throat.   Eyes: Negative for pain, discharge, redness and itching.  Respiratory: Negative for chest tightness, shortness of breath, wheezing and stridor.   Gastrointestinal: Negative for diarrhea, nausea and vomiting.  Endocrine: Negative for polydipsia.  Musculoskeletal: Negative for arthralgias, joint swelling and myalgias.  Skin: Negative for rash.  Allergic/Immunologic: Positive for environmental allergies. Negative for food allergies and immunocompromised state.  Hematological: Negative for adenopathy. Does not bruise/bleed easily.    Objective:  Physical exam not obtained as encounter was done via telephone.   Previous notes and tests were reviewed.  I discussed the assessment and treatment plan with the patient. The patient was provided an opportunity to ask questions and all were answered. The patient agreed with the plan and demonstrated an understanding of the instructions.   The patient was advised to call back or seek an in-person evaluation if the symptoms worsen or if the condition fails to improve as anticipated.  I provided 30 minutes of non-face-to-face time during this  encounter.  It was my pleasure to participate in [place] care today. Please feel free to contact me with any questions or concerns.   Sincerely,   Valentina Shaggy, MD

## 2019-03-14 ENCOUNTER — Telehealth: Payer: Self-pay

## 2019-03-14 NOTE — Telephone Encounter (Signed)
Patient was switched to Flovent to see if it will be cheaper for the patient and it is $200, patient states she can not afford that one. Is there something else?  Thanks

## 2019-03-14 NOTE — Telephone Encounter (Signed)
Whatever works for her to get the medications that she needs. Thanks, team!   Salvatore Marvel, MD [facility] [place] [facility]

## 2019-03-14 NOTE — Telephone Encounter (Signed)
Patient stated she only wanted Flovent to see if it was cheaper but would just stay on Symbicort instead since the cost is the same. Patient stated she will try out the programs and is requesting samples until she is approved or other plans.

## 2019-03-14 NOTE — Telephone Encounter (Signed)
Attempted to call patient but mailbox was full and unable to notify patient. Patient will need to follow up on these program as we cannot look up the program for her. She will need to contact them and see if she can apply for them. Patient is allowed to use our fax machine per Sigurd Sos since it is related to her health.

## 2019-03-14 NOTE — Telephone Encounter (Signed)
I am not familiar with those programs but as this is related to her medication and health if she wants to bring in her completed paperwork for these programs we can fax them for her.

## 2019-03-14 NOTE — Telephone Encounter (Signed)
Please advise, Patient has tried [place] and Symbicort which the cost of the medication were too much for her spending. Patient was given Flovent but states it's to expensive and patient was attempting to be on [place] but was denied for the program. Patient was offered another program she should try but patient was concern about cost to send information? The programs 1: [place] [phone], 2) [facility] [phone] Please advise on this, Also patient was having difficult faxing information to this program and was wondering if we may assit just with the faxing for these programs since she is having to pay for cost of paper and faxing at [employer] or [employer].

## 2019-03-15 ENCOUNTER — Ambulatory Visit (INDEPENDENT_AMBULATORY_CARE_PROVIDER_SITE_OTHER): Payer: Medicare HMO | Admitting: Internal Medicine

## 2019-03-15 ENCOUNTER — Encounter: Payer: Self-pay | Admitting: Internal Medicine

## 2019-03-15 DIAGNOSIS — J4530 Mild persistent asthma, uncomplicated: Secondary | ICD-10-CM

## 2019-03-15 DIAGNOSIS — E119 Type 2 diabetes mellitus without complications: Secondary | ICD-10-CM

## 2019-03-15 DIAGNOSIS — G4733 Obstructive sleep apnea (adult) (pediatric): Secondary | ICD-10-CM | POA: Diagnosis not present

## 2019-03-15 DIAGNOSIS — K219 Gastro-esophageal reflux disease without esophagitis: Secondary | ICD-10-CM | POA: Diagnosis not present

## 2019-03-15 DIAGNOSIS — R69 Illness, unspecified: Secondary | ICD-10-CM | POA: Diagnosis not present

## 2019-03-15 DIAGNOSIS — E039 Hypothyroidism, unspecified: Secondary | ICD-10-CM

## 2019-03-15 DIAGNOSIS — J4521 Mild intermittent asthma with (acute) exacerbation: Secondary | ICD-10-CM | POA: Diagnosis not present

## 2019-03-15 DIAGNOSIS — I10 Essential (primary) hypertension: Secondary | ICD-10-CM

## 2019-03-15 DIAGNOSIS — F200 Paranoid schizophrenia: Secondary | ICD-10-CM

## 2019-03-15 DIAGNOSIS — E782 Mixed hyperlipidemia: Secondary | ICD-10-CM | POA: Diagnosis not present

## 2019-03-15 NOTE — Assessment & Plan Note (Signed)
Will have her set up nurse visit for BP check Will have her set up lab visit for CMET Continue Lisinopril, Amlodipine, Carvedilol and Hydralazine Reinforced DASH diet and exercise for weight loss

## 2019-03-15 NOTE — Assessment & Plan Note (Signed)
Stable on Haldol CMET today She will continue to follow with psych

## 2019-03-15 NOTE — Assessment & Plan Note (Signed)
Discussed how weight loss could help improve reflux Continue Omeprazole for now Will have her set up lab only appt for CBC and CMET

## 2019-03-15 NOTE — Assessment & Plan Note (Addendum)
Discussed how weight loss could help improve OSA Awaiting appt from pulmo for repeat sleep study She will continue to follow with pulmonology

## 2019-03-15 NOTE — Assessment & Plan Note (Signed)
Will have her set up lab only A1C No microalbumin needed secondary to ACEI therapy Encouraged her to consume a low carb diet and exercise for weight loss Encouraged daily foot exams Encouraged yearly eye exams Flu shot UTD Will have her make an nurse appt for pneumovax

## 2019-03-15 NOTE — Patient Instructions (Signed)
Fat and Cholesterol Restricted Eating Plan Getting too much fat and cholesterol in your diet may cause health problems. Choosing the right foods helps keep your fat and cholesterol at normal levels. This can keep you from getting certain diseases. Your doctor may recommend an eating plan that includes:  Total fat: ______% or less of total calories a day.  Saturated fat: ______% or less of total calories a day.  Cholesterol: less than _________mg a day.  Fiber: ______g a day. What are tips for following this plan? Meal planning  At meals, divide your plate into four equal parts: ? Fill one-half of your plate with vegetables and green salads. ? Fill one-fourth of your plate with whole grains. ? Fill one-fourth of your plate with low-fat (lean) protein foods.  Eat fish that is high in omega-3 fats at least two times a week. This includes mackerel, tuna, sardines, and salmon.  Eat foods that are high in fiber, such as whole grains, beans, apples, broccoli, carrots, peas, and barley. General tips   Work with your doctor to lose weight if you need to.  Avoid: ? Foods with added sugar. ? Fried foods. ? Foods with partially hydrogenated oils.  Limit alcohol intake to no more than 1 drink a day for nonpregnant women and 2 drinks a day for men. One drink equals 12 oz of beer, 5 oz of wine, or 1 oz of hard liquor. Reading food labels  Check food labels for: ? Trans fats. ? Partially hydrogenated oils. ? Saturated fat (g) in each serving. ? Cholesterol (mg) in each serving. ? Fiber (g) in each serving.  Choose foods with healthy fats, such as: ? Monounsaturated fats. ? Polyunsaturated fats. ? Omega-3 fats.  Choose grain products that have whole grains. Look for the word "whole" as the first word in the ingredient list. Cooking  Cook foods using low-fat methods. These include baking, boiling, grilling, and broiling.  Eat more home-cooked foods. Eat at restaurants and buffets  less often.  Avoid cooking using saturated fats, such as butter, cream, palm oil, palm kernel oil, and coconut oil. Recommended foods  Fruits  All fresh, canned (in natural juice), or frozen fruits. Vegetables  Fresh or frozen vegetables (raw, steamed, roasted, or grilled). Green salads. Grains  Whole grains, such as whole wheat or whole grain breads, crackers, cereals, and pasta. Unsweetened oatmeal, bulgur, barley, quinoa, or brown rice. Corn or whole wheat flour tortillas. Meats and other protein foods  Ground beef (85% or leaner), grass-fed beef, or beef trimmed of fat. Skinless chicken or turkey. Ground chicken or turkey. Pork trimmed of fat. All fish and seafood. Egg whites. Dried beans, peas, or lentils. Unsalted nuts or seeds. Unsalted canned beans. Nut butters without added sugar or oil. Dairy  Low-fat or nonfat dairy products, such as skim or 1% milk, 2% or reduced-fat cheeses, low-fat and fat-free ricotta or cottage cheese, or plain low-fat and nonfat yogurt. Fats and oils  Tub margarine without trans fats. Light or reduced-fat mayonnaise and salad dressings. Avocado. Olive, canola, sesame, or safflower oils. The items listed above may not be a complete list of foods and beverages you can eat. Contact a dietitian for more information. Foods to avoid Fruits  Canned fruit in heavy syrup. Fruit in cream or butter sauce. Fried fruit. Vegetables  Vegetables cooked in cheese, cream, or butter sauce. Fried vegetables. Grains  White bread. White pasta. White rice. Cornbread. Bagels, pastries, and croissants. Crackers and snack foods that contain trans fat   and hydrogenated oils. Meats and other protein foods  Fatty cuts of meat. Ribs, chicken wings, bacon, sausage, bologna, salami, chitterlings, fatback, hot dogs, bratwurst, and packaged lunch meats. Liver and organ meats. Whole eggs and egg yolks. Chicken and turkey with skin. Fried meat. Dairy  Whole or 2% milk, cream,  half-and-half, and cream cheese. Whole milk cheeses. Whole-fat or sweetened yogurt. Full-fat cheeses. Nondairy creamers and whipped toppings. Processed cheese, cheese spreads, and cheese curds. Beverages  Alcohol. Sugar-sweetened drinks such as sodas, lemonade, and fruit drinks. Fats and oils  Butter, stick margarine, lard, shortening, ghee, or bacon fat. Coconut, palm kernel, and palm oils. Sweets and desserts  Corn syrup, sugars, honey, and molasses. Candy. Jam and jelly. Syrup. Sweetened cereals. Cookies, pies, cakes, donuts, muffins, and ice cream. The items listed above may not be a complete list of foods and beverages you should avoid. Contact a dietitian for more information. Summary  Choosing the right foods helps keep your fat and cholesterol at normal levels. This can keep you from getting certain diseases.  At meals, fill one-half of your plate with vegetables and green salads.  Eat high-fiber foods, like whole grains, beans, apples, carrots, peas, and barley.  Limit added sugar, saturated fats, alcohol, and fried foods. This information is not intended to replace advice given to you by your health care provider. Make sure you discuss any questions you have with your health care provider. Document Released: 05/23/2012 Document Revised: 07/26/2018 Document Reviewed: 08/09/2017 Elsevier Interactive Patient Education  2019 Elsevier Inc.   

## 2019-03-15 NOTE — Progress Notes (Signed)
Virtual Visit via Video Note  I connected with Maria Franklin on 03/15/19 at  9:45 AM EDT by a video enabled telemedicine application and verified that I am speaking with the correct person using two identifiers.   I discussed the limitations of evaluation and management by telemedicine and the availability of in person appointments. The patient expressed understanding and agreed to proceed.  History of Present Illness:  Pt due for follow up of chronic conditions.  Hypothyroidism: Her TSH from 02/2019 was normal. She denies any issues on her current dose of Levothyroxine. She follows with endocrinologist.  DM 2: Her last A1C was 6.5%, 12/2018. She does not check her sugars. She is taking Metformin as prescribed. She checks her feet routinely. Her last eye exam was 6 months ago. Flu 09/2018. Pneumovax 10/2014. She follows with endocrinology.  OSA: She averages 7 hours of sleep per night. She is not using the CPAP. Sleep study from 06/2016 reviewed.  HTN: She does not monitor her BP at home. She is taking Lisinopril, Amlodipine, Carvedilol, and Hydralazine as prescribed.  HLD: Her last LDL was 72, 02/2018. She denies myalgias on Atorvastatin. She is taking Fish Oil OTC. She does not consume a low fat diet.  GERD: Triggered by eating late at night. She denies breakthrough on Omeprazole. There is no upper GI on file.   Asthma: Having chronic shortness of breath. Stable on Singulair, Symbicort and Albuterol. PFT's from 12/2018 reviewed. She follows with pulmonology.  Schizophrenia: Chronic but stable on Haldol. She denies audio or visual hallucinations. She denies paranoia. She denies SI/HI. She is following with psychiatry.  Observations/Objective:  Alert and oriented NAD Well kempt Behavior, judgement and thought content normal  Assessment and Plan:  See problem based charting  Follow Up Instructions:    I discussed the assessment and treatment plan with the patient. The patient was  provided an opportunity to ask questions and all were answered. The patient agreed with the plan and demonstrated an understanding of the instructions.   The patient was advised to call back or seek an in-person evaluation if the symptoms worsen or if the condition fails to improve as anticipated.    Maria Silversmith, NP

## 2019-03-15 NOTE — Assessment & Plan Note (Signed)
Will have her set up lab only appt for CMET and Lipid profile Encouraged her to consume a low fat diet Continue Atorvastatin and Fish Oil for now

## 2019-03-15 NOTE — Assessment & Plan Note (Addendum)
Will have her set up lab only Free T4 Will adjust Levothyroxine if needed based on labs.

## 2019-03-15 NOTE — Assessment & Plan Note (Signed)
Continue Symbicort, Singulair and Albuterol Continue to follow with pulmonology

## 2019-03-15 NOTE — Telephone Encounter (Signed)
Left samples of Symbicort up front for patient to pick up

## 2019-03-21 ENCOUNTER — Other Ambulatory Visit: Payer: Self-pay

## 2019-03-21 ENCOUNTER — Other Ambulatory Visit (INDEPENDENT_AMBULATORY_CARE_PROVIDER_SITE_OTHER): Payer: Medicare HMO

## 2019-03-21 ENCOUNTER — Ambulatory Visit (INDEPENDENT_AMBULATORY_CARE_PROVIDER_SITE_OTHER): Payer: Medicare HMO

## 2019-03-21 VITALS — BP 124/82

## 2019-03-21 DIAGNOSIS — E119 Type 2 diabetes mellitus without complications: Secondary | ICD-10-CM | POA: Diagnosis not present

## 2019-03-21 DIAGNOSIS — F200 Paranoid schizophrenia: Secondary | ICD-10-CM

## 2019-03-21 DIAGNOSIS — I10 Essential (primary) hypertension: Secondary | ICD-10-CM

## 2019-03-21 DIAGNOSIS — E782 Mixed hyperlipidemia: Secondary | ICD-10-CM

## 2019-03-21 DIAGNOSIS — K219 Gastro-esophageal reflux disease without esophagitis: Secondary | ICD-10-CM

## 2019-03-21 DIAGNOSIS — Z23 Encounter for immunization: Secondary | ICD-10-CM | POA: Diagnosis not present

## 2019-03-21 DIAGNOSIS — J4530 Mild persistent asthma, uncomplicated: Secondary | ICD-10-CM

## 2019-03-21 DIAGNOSIS — E039 Hypothyroidism, unspecified: Secondary | ICD-10-CM | POA: Diagnosis not present

## 2019-03-21 DIAGNOSIS — R69 Illness, unspecified: Secondary | ICD-10-CM | POA: Diagnosis not present

## 2019-03-21 DIAGNOSIS — G4733 Obstructive sleep apnea (adult) (pediatric): Secondary | ICD-10-CM | POA: Diagnosis not present

## 2019-03-21 LAB — HEMOGLOBIN A1C: Hgb A1c MFr Bld: 6.5 % (ref 4.6–6.5)

## 2019-03-21 LAB — CBC
HCT: 32.9 % — ABNORMAL LOW (ref 36.0–46.0)
Hemoglobin: 10.6 g/dL — ABNORMAL LOW (ref 12.0–15.0)
MCHC: 32.3 g/dL (ref 30.0–36.0)
MCV: 82.1 fl (ref 78.0–100.0)
Platelets: 285 10*3/uL (ref 150.0–400.0)
RBC: 4.01 Mil/uL (ref 3.87–5.11)
RDW: 15 % (ref 11.5–15.5)
WBC: 7.1 10*3/uL (ref 4.0–10.5)

## 2019-03-21 LAB — COMPREHENSIVE METABOLIC PANEL
ALT: 15 U/L (ref 0–35)
AST: 17 U/L (ref 0–37)
Albumin: 4.1 g/dL (ref 3.5–5.2)
Alkaline Phosphatase: 92 U/L (ref 39–117)
BUN: 17 mg/dL (ref 6–23)
CO2: 30 mEq/L (ref 19–32)
Calcium: 9 mg/dL (ref 8.4–10.5)
Chloride: 101 mEq/L (ref 96–112)
Creatinine, Ser: 0.98 mg/dL (ref 0.40–1.20)
GFR: 70.58 mL/min (ref >60.00)
Glucose, Bld: 85 mg/dL (ref 70–99)
Potassium: 3.1 mEq/L — ABNORMAL LOW (ref 3.5–5.1)
Sodium: 138 mEq/L (ref 135–145)
Total Bilirubin: 0.3 mg/dL (ref 0.2–1.2)
Total Protein: 7.3 g/dL (ref 6.0–8.3)

## 2019-03-21 LAB — LIPID PANEL
Cholesterol: 161 mg/dL (ref 0–200)
HDL: 67.8 mg/dL (ref >39.00)
LDL Cholesterol: 71 mg/dL (ref 0–99)
NonHDL: 92.93
Total CHOL/HDL Ratio: 2
Triglycerides: 109 mg/dL (ref 0.0–149.0)
VLDL: 21.8 mg/dL (ref 0.0–40.0)

## 2019-03-21 LAB — T4, FREE: Free T4: 2.11 ng/dL — ABNORMAL HIGH (ref 0.60–1.60)

## 2019-03-21 NOTE — Progress Notes (Signed)
Pt was seen for BP check and Pneumovax 23  BP was 124/82

## 2019-03-26 ENCOUNTER — Encounter: Payer: Self-pay | Admitting: Internal Medicine

## 2019-03-27 ENCOUNTER — Telehealth: Payer: Self-pay

## 2019-03-27 MED ORDER — FERROUS SULFATE 325 (65 FE) MG PO TABS: 325.0000 mg | ORAL_TABLET | Freq: Every day | ORAL | 2 refills | Status: DC

## 2019-03-27 NOTE — Telephone Encounter (Signed)
done

## 2019-03-27 NOTE — Addendum Note (Signed)
Addended by: Jearld Fenton on: 03/27/2019 06:29 PM   Modules accepted: Orders

## 2019-03-27 NOTE — Telephone Encounter (Signed)
Pt said she cannot find an iron supplement OTC at any pharmacy. Pt wants to know if a prescription iron supplement could be sent to [facility]. In 03/21/19 lab result pt was to start iron supplement 325 mg taking one tab po daily. Pt request cb after Avie Echevaria NP reviews note.

## 2019-03-29 ENCOUNTER — Other Ambulatory Visit: Payer: Self-pay

## 2019-03-29 ENCOUNTER — Emergency Department (HOSPITAL_COMMUNITY)
Admission: EM | Admit: 2019-03-29 | Discharge: 2019-03-29 | Disposition: A | Discharge status: Home / Self Care | Payer: Medicare HMO | Attending: Emergency Medicine | Admitting: Emergency Medicine

## 2019-03-29 ENCOUNTER — Encounter (HOSPITAL_COMMUNITY): Payer: Self-pay

## 2019-03-29 DIAGNOSIS — Z87891 Personal history of nicotine dependence: Secondary | ICD-10-CM | POA: Diagnosis not present

## 2019-03-29 DIAGNOSIS — Z8709 Personal history of other diseases of the respiratory system: Secondary | ICD-10-CM | POA: Insufficient documentation

## 2019-03-29 DIAGNOSIS — Z91040 Latex allergy status: Secondary | ICD-10-CM | POA: Insufficient documentation

## 2019-03-29 DIAGNOSIS — I10 Essential (primary) hypertension: Secondary | ICD-10-CM | POA: Insufficient documentation

## 2019-03-29 DIAGNOSIS — Z79899 Other long term (current) drug therapy: Secondary | ICD-10-CM | POA: Diagnosis not present

## 2019-03-29 DIAGNOSIS — H9201 Otalgia, right ear: Secondary | ICD-10-CM | POA: Diagnosis not present

## 2019-03-29 DIAGNOSIS — H9202 Otalgia, left ear: Secondary | ICD-10-CM | POA: Diagnosis not present

## 2019-03-29 DIAGNOSIS — Z87898 Personal history of other specified conditions: Secondary | ICD-10-CM

## 2019-03-29 DIAGNOSIS — Z711 Person with feared health complaint in whom no diagnosis is made: Secondary | ICD-10-CM | POA: Insufficient documentation

## 2019-03-29 DIAGNOSIS — R69 Illness, unspecified: Secondary | ICD-10-CM | POA: Diagnosis not present

## 2019-03-29 DIAGNOSIS — Z7984 Long term (current) use of oral hypoglycemic drugs: Secondary | ICD-10-CM | POA: Insufficient documentation

## 2019-03-29 DIAGNOSIS — E119 Type 2 diabetes mellitus without complications: Secondary | ICD-10-CM | POA: Diagnosis not present

## 2019-03-29 DIAGNOSIS — F209 Schizophrenia, unspecified: Secondary | ICD-10-CM | POA: Diagnosis not present

## 2019-03-29 DIAGNOSIS — R51 Headache: Secondary | ICD-10-CM | POA: Diagnosis not present

## 2019-03-29 DIAGNOSIS — E079 Disorder of thyroid, unspecified: Secondary | ICD-10-CM | POA: Diagnosis not present

## 2019-03-29 NOTE — ED Provider Notes (Signed)
[facility] Provider Note   CSN: [id number] Arrival date & time: 03/29/19  8295    History   Chief Complaint Chief Complaint  Patient presents with  . Dizziness  . Headache    HPI Maria Franklin is a 58 y.o. female with past medical history of asthma, schizophrenia, hypertension, DM, who presents today for evaluation of headache.  She reports that she had a headache over the past 4 to 5 days however it resolved this morning.  She denies any vision changes.  She reports feeling slightly off balance and lightheaded, however denies difficulty walking.  Her symptoms do not change with position changes.  She denies striking her head or passing out.  No fevers or neck pain.  She denies any weakness, numbness, or tingling.  No medication changes recently.  She denies any fevers.  She does report seasonal allergies.  She reports that she is here because she was watching the news and saw on the news that people were "getting blood clots before they got a stroke" and she is concerned that her resolved headache may have been related.     HPI  Past Medical History:  Diagnosis Date  . Allergy   . Anemia   . Angio-edema   . Asthma    ALL THE TIME   LAST FLARE UP 12/2018  . Chicken pox   . Diabetes mellitus without complication ([place])   . GERD (gastroesophageal reflux disease)   . Hyperlipidemia   . Hypertension   . Hyperthyroidism   . Recurrent upper respiratory infection (URI)   . Schizophrenia ([place])   . Sleep apnea    DX 2-3 YRS AGO.  Marland Kitchen Thyroid disease   . Urticaria     Patient Active Problem List   Diagnosis Date Noted  . Sinusitis with nasal polyps 01/05/2019  . OSA (obstructive sleep apnea) 04/29/2016  . GERD (gastroesophageal reflux disease) 10/28/2015  . Asthma, mild persistent 04/21/2015  . Paranoid schizophrenia, chronic condition ([place]) 04/19/2014  . Seasonal allergies 04/19/2014  . DM2 (diabetes mellitus, type 2) ([place]) 04/19/2014   . Angina, [place] 12/30/2013  . Hypertension 01/10/2013  . Hyperlipidemia 01/10/2013  . Hypothyroidism 01/10/2013    Past Surgical History:  Procedure Laterality Date  . SINOSCOPY    . SINUS ENDO W/FUSION Bilateral 01/05/2019   Procedure: ENDOSCOPIC SINUS SURGERY WITH NAVIGATION;  Surgeon: Jerrell Belfast, MD;  Location: [facility];  Service: ENT;  Laterality: Bilateral;  . SINUS IRRIGATION    . TONSILLECTOMY    . TURBINATE REDUCTION N/A 01/05/2019   Procedure: Turbinate Reduction;  Surgeon: Jerrell Belfast, MD;  Location: [facility];  Service: ENT;  Laterality: N/A;     OB History   No obstetric history on file.      Home Medications    Prior to Admission medications   Medication Sig Start Date End Date Taking? Authorizing Provider  albuterol (PROVENTIL) (2.5 MG/3ML) 0.083% nebulizer solution Take 3 mLs (2.5 mg total) by nebulization every 4 (four) hours as needed for wheezing or shortness of breath. 12/14/18   Valentina Shaggy, MD  albuterol (VENTOLIN HFA) 108 (90 Base) MCG/ACT inhaler INHALE ONE TO TWO PUFFS INTO LUNGS EVERY 6 HOURS AS NEEDED FOR WHEEZING OR SHORTNESS OF BREATH 02/07/19   Jearld Fenton, NP  amLODipine (NORVASC) 10 MG tablet Take 1 tablet (10 mg total) by mouth daily. MUST SCHEDULE PHYSICAL EXAM 02/07/19   Baity, Coralie Keens, NP  Apoaequorin (PREVAGEN EXTRA STRENGTH) 20 MG  CAPS Take 20 mg by mouth daily.    [provider]  atorvastatin (LIPITOR) 10 MG tablet TAKE 1 TABLET DAILY AT 6PM 02/12/19   Jearld Fenton, NP  BIOTIN PO Take 1 tablet by mouth daily.    [provider]  budesonide-formoterol (SYMBICORT) 160-4.5 MCG/ACT inhaler Inhale 2 puffs into the lungs 2 (two) times daily. 12/14/18   Valentina Shaggy, MD  carvedilol (COREG) 12.5 MG tablet Take 1 tablet (12.5 mg total) by mouth 2 (two) times daily with a meal. MUST SCHEDULE ANNUAL PHYSICAL 02/07/19   Jearld Fenton, NP  EPINEPHrine 0.3 mg/0.3 mL IJ SOAJ injection Inject 0.3 mg into the muscle  once.  02/07/18   [provider]  ferrous sulfate 325 (65 FE) MG tablet Take 1 tablet (325 mg total) by mouth daily with breakfast. 03/27/19   Jearld Fenton, NP  haloperidol (HALDOL) 1 MG tablet Take 1 tablet (1 mg total) by mouth at bedtime. 01/09/19 01/09/20  Arfeen, Arlyce Harman, MD  hydrALAZINE (APRESOLINE) 10 MG tablet Take 1 tablet (10 mg total) by mouth 2 (two) times daily. MUST SCHEDULE ANNUAL PHYSICAL 02/07/19   Jearld Fenton, NP  levothyroxine (SYNTHROID, LEVOTHROID) 50 MCG tablet Take 1 tablet (50 mcg total) by mouth daily before breakfast. MUST SCHEDULE ANNUAL PHYSICAL 02/07/19   Jearld Fenton, NP  lisinopril (PRINIVIL,ZESTRIL) 40 MG tablet Take 1 tablet (40 mg total) by mouth daily. MUST SCHEDULE PHYSICAL EXAM 02/07/19   Jearld Fenton, NP  metFORMIN (GLUCOPHAGE-XR) 500 MG 24 hr tablet Take 1,000 mg by mouth every evening.  10/10/18   [provider]  montelukast (SINGULAIR) 10 MG tablet Take 1 tablet (10 mg total) by mouth daily. MUST SCHEDULE PHYSICAL EXAM 02/07/19   Jearld Fenton, NP  Omega-3 Fatty Acids (FISH OIL PO) Take 1 capsule by mouth daily.    [provider]  omeprazole (PRILOSEC) 40 MG capsule TAKE 1 CAPSULE DAILY 02/12/19   Jearld Fenton, NP    Family History Family History  Problem Relation Age of Onset  . Mental illness Mother   . Hypertension Mother   . Schizophrenia Mother   . Depression Mother   . Cancer Father        LUNG  . Cancer Sister        BREAST  . Breast cancer Sister   . Mental illness Brother   . Hypertension Brother   . Schizophrenia Brother   . Depression Brother   . Heart disease Maternal Grandmother   . Depression Maternal Grandmother   . Schizophrenia Maternal Grandmother   . Schizophrenia Brother   . Schizophrenia Maternal Uncle   . Breast cancer Maternal Aunt     Social History Social History   Tobacco Use  . Smoking status: Former Smoker    Packs/day: 0.25    Years: 6.00    Pack years: 1.50  . Smokeless  tobacco: Never Used  . Tobacco comment: QUIT IN HER EARLY 20'S  Substance Use Topics  . Alcohol use: No    Alcohol/week: 0.0 standard drinks  . Drug use: No     Allergies   Bee pollen; Pollen extract; and Latex   Review of Systems Review of Systems  Constitutional: Negative for chills and fever.  HENT: Positive for ear pain (Has resolved). Negative for congestion, sinus pressure, sore throat and trouble swallowing.   Eyes: Negative for photophobia, redness and visual disturbance.  Respiratory: Negative for chest tightness and shortness of breath.  Gastrointestinal: Negative for abdominal distention.  Genitourinary: Negative for dysuria.  Neurological: Positive for headaches (All over, has fully resolved). Negative for dizziness and weakness.  All other systems reviewed and are negative.    Physical Exam Updated Vital Signs BP (!) 156/102 (BP Location: Right Arm)   Pulse 87   Temp 98.7 F (37.1 C) (Oral)   Resp 16   LMP 02/02/2017 (Approximate)   SpO2 99%   Physical Exam Vitals signs and nursing note reviewed.  Constitutional:      General: She is not in acute distress.    Appearance: She is well-developed. She is obese.  HENT:     Head: Normocephalic and atraumatic.     Comments: No TTP over temporal arteries bilaterally.     Right Ear: Ear canal and external ear normal. No mastoid tenderness.     Left Ear: Ear canal and external ear normal. No mastoid tenderness.     Ears:     Comments: Ears: Bilateral (R>L) serous effusions Eyes:     Extraocular Movements: Extraocular movements intact.     Right eye: No nystagmus.     Left eye: No nystagmus.     Conjunctiva/sclera: Conjunctivae normal.     Pupils: Pupils are equal, round, and reactive to light.  Neck:     Musculoskeletal: Normal range of motion and neck supple. No neck rigidity.  Cardiovascular:     Rate and Rhythm: Normal rate and regular rhythm.     Heart sounds: Normal heart sounds. No murmur.   Pulmonary:     Effort: Pulmonary effort is normal. No respiratory distress.     Breath sounds: Normal breath sounds.  Abdominal:     Palpations: Abdomen is soft.     Tenderness: There is no abdominal tenderness.  Musculoskeletal: Normal range of motion.  Lymphadenopathy:     Cervical: No cervical adenopathy.  Skin:    General: Skin is warm and dry.  Neurological:     Mental Status: She is alert and oriented to person, place, and time.     Comments: Mental Status:  Alert, oriented, thought content appropriate, able to give a coherent history. Speech fluent without evidence of aphasia. Able to follow 2 step commands without difficulty.  Cranial Nerves:  II:  Peripheral visual fields grossly normal, pupils equal, round, reactive to light III,IV, VI: ptosis not present, extra-ocular motions intact bilaterally  V,VII: smile symmetric, facial light touch sensation equal VIII: hearing grossly normal to voice  X: uvula elevates symmetrically  XI: bilateral shoulder shrug symmetric and strong XII: midline tongue extension without fassiculations Motor:  Normal tone. 5/5 in upper and lower extremities bilaterally including strong and equal grip strength and dorsiflexion/plantar flexion Cerebellar: normal finger-to-nose with bilateral upper extremities Gait: normal gait and balance, able to heal walk and toe walk.   CV: distal pulses palpable throughout    Psychiatric:        Attention and Perception: Attention normal.        Mood and Affect: Mood normal.      ED Treatments / Results  Labs (all labs ordered are listed, but only abnormal results are displayed) Labs Reviewed - No data to display  EKG EKG Interpretation  Date/Time:  Thursday March 29 2019 18:45:06 EDT Ventricular Rate:  74 PR Interval:  148 QRS Duration: 82 QT Interval:  384 QTC Calculation: 426 R Axis:   -43 Text Interpretation:  Normal sinus rhythm Left axis deviation Abnormal ECG since last tracing no  significant change  Confirmed by Daleen Bo [phone]) on 03/29/2019 6:59:18 PM   Radiology No results found.  Procedures Procedures (including critical care time)  Medications Ordered in ED Medications - No data to display   Initial Impression / Assessment and Plan / ED Course  I have reviewed the triage vital signs and the nursing notes.  Pertinent labs & imaging results that were available during my care of the patient were reviewed by me and considered in my medical decision making (see chart for details).       Patient presents today for concern over headache.  She had a headache for the past 4 to 5 days however it fully resolved prior to arrival.  Chart review shows that she had labs obtained, while she reports she was having this headache, but did not show acute abnormalities.  Chart review also shows that she had an MRI performed 02/17/2019 without acute abnormalities, masses, or other concerns.  Patient was offered repeat labs, CT scan of head, however she declined.  Her headache has fully resolved.  She is neurologically intact on exam.  He has an appointment with her PCP already scheduled for tomorrow.  Given that her headache has fully resolved uncertain as to the underlying etiology.  She did report feeling slightly off balance, and has bilateral serous effusions which I suspect are contributing.  There is no evidence of secondary infection.  Conservative care recommended.  She was able to ambulate without difficulty, is not ataxic, is able to stand on 1 foot without significant difficulty.  She declined orthostatic vital sign testing.  Return precautions were discussed with patient who states their understanding.  At the time of discharge patient denied any unaddressed complaints or concerns.  Patient is agreeable for discharge home.  This patient was seen as a shared visit with Dr. Wilson Singer.   Final Clinical Impressions(s) / ED Diagnoses   Final diagnoses:  History of  headache    ED Discharge Orders    None       Ollen Gross 03/29/19 1946    Virgel Manifold, MD 03/30/19 2021

## 2019-03-29 NOTE — ED Triage Notes (Signed)
Pt reports 4-5 days of dizziness and right sided headache. States she has recently been battling flu like symptoms (cough, SOB, sore throat) these symptoms have now resolved. Pt denies weakness, no facial droop. Reports some blurred vision. Pt a.o, ambulatory upon d.c

## 2019-03-29 NOTE — Discharge Instructions (Signed)
Please keep your appointment with your primary care doctor for tomorrow.  If your headache returns and is concerned, or you develop any other new concerning symptoms please seek additional medical care and evaluation.

## 2019-03-30 ENCOUNTER — Ambulatory Visit (INDEPENDENT_AMBULATORY_CARE_PROVIDER_SITE_OTHER): Payer: Medicare HMO | Admitting: Internal Medicine

## 2019-03-30 ENCOUNTER — Encounter: Payer: Self-pay | Admitting: Internal Medicine

## 2019-03-30 DIAGNOSIS — R5381 Other malaise: Secondary | ICD-10-CM | POA: Diagnosis not present

## 2019-03-30 DIAGNOSIS — R42 Dizziness and giddiness: Secondary | ICD-10-CM | POA: Diagnosis not present

## 2019-03-30 DIAGNOSIS — G44201 Tension-type headache, unspecified, intractable: Secondary | ICD-10-CM | POA: Diagnosis not present

## 2019-03-30 DIAGNOSIS — R0602 Shortness of breath: Secondary | ICD-10-CM

## 2019-03-30 NOTE — Patient Instructions (Signed)

## 2019-03-30 NOTE — Progress Notes (Signed)
Virtual Visit via Video Note  I connected with Maria Franklin on 03/30/19 at  2:45 PM EDT by a video enabled telemedicine application and verified that I am speaking with the correct person using two identifiers.   I discussed the limitations of evaluation and management by telemedicine and the availability of in person appointments. The patient expressed understanding and agreed to proceed.  Patient Location: Home  Provider Location: Home  History of Present Illness:  Pt due for ER follow up.  She went to the ER 4/23 with symptoms that she was concerned were like that of the [place]. She had reports headache (which had resolved), lightheadedness. They felt like she had some ETD and advised her to use Flonase OTC. She declined head CT. She was discharged and advised to follow up with her PCP. Since that time, she reports the headache and lightheadedness have been intermittent. She reports the headaches starts at the base of her neck and radiates into her forehead. She describes the pain as tightness. She denies neck or shoulder pain. She denies head trauma. She reports over the last month though, she has had URI symptoms, fever, and SOB. She is concerned that all this is related to COVID and thinks she may had the virus. She would like to have the antibody testing for COVID 19.      Past Medical History:  Diagnosis Date  . Allergy   . Anemia   . Angio-edema   . Asthma    ALL THE TIME   LAST FLARE UP 12/2018  . Chicken pox   . Diabetes mellitus without complication ([place])   . GERD (gastroesophageal reflux disease)   . Hyperlipidemia   . Hypertension   . Hyperthyroidism   . Recurrent upper respiratory infection (URI)   . Schizophrenia ([place])   . Sleep apnea    DX 2-3 YRS AGO.  Marland Kitchen Thyroid disease   . Urticaria     Current Outpatient Medications  Medication Sig Dispense Refill  . albuterol (PROVENTIL) (2.5 MG/3ML) 0.083% nebulizer solution Take 3 mLs (2.5 mg total) by nebulization  every 4 (four) hours as needed for wheezing or shortness of breath. 75 mL 11  . albuterol (VENTOLIN HFA) 108 (90 Base) MCG/ACT inhaler INHALE ONE TO TWO PUFFS INTO LUNGS EVERY 6 HOURS AS NEEDED FOR WHEEZING OR SHORTNESS OF BREATH 18 each 1  . amLODipine (NORVASC) 10 MG tablet Take 1 tablet (10 mg total) by mouth daily. MUST SCHEDULE PHYSICAL EXAM 90 tablet 0  . Apoaequorin (PREVAGEN EXTRA STRENGTH) 20 MG CAPS Take 20 mg by mouth daily.    Marland Kitchen atorvastatin (LIPITOR) 10 MG tablet TAKE 1 TABLET DAILY AT 6PM 90 tablet 0  . BIOTIN PO Take 1 tablet by mouth daily.    . budesonide-formoterol (SYMBICORT) 160-4.5 MCG/ACT inhaler Inhale 2 puffs into the lungs 2 (two) times daily. 1 Inhaler 5  . carvedilol (COREG) 12.5 MG tablet Take 1 tablet (12.5 mg total) by mouth 2 (two) times daily with a meal. MUST SCHEDULE ANNUAL PHYSICAL 180 tablet 0  . EPINEPHrine 0.3 mg/0.3 mL IJ SOAJ injection Inject 0.3 mg into the muscle once.     . ferrous sulfate 325 (65 FE) MG tablet Take 1 tablet (325 mg total) by mouth daily with breakfast. 30 tablet 2  . haloperidol (HALDOL) 1 MG tablet Take 1 tablet (1 mg total) by mouth at bedtime. 90 tablet 1  . hydrALAZINE (APRESOLINE) 10 MG tablet Take 1 tablet (10 mg total) by mouth  2 (two) times daily. MUST SCHEDULE ANNUAL PHYSICAL 180 tablet 0  . levothyroxine (SYNTHROID, LEVOTHROID) 50 MCG tablet Take 1 tablet (50 mcg total) by mouth daily before breakfast. MUST SCHEDULE ANNUAL PHYSICAL 90 tablet 0  . lisinopril (PRINIVIL,ZESTRIL) 40 MG tablet Take 1 tablet (40 mg total) by mouth daily. MUST SCHEDULE PHYSICAL EXAM 90 tablet 0  . metFORMIN (GLUCOPHAGE-XR) 500 MG 24 hr tablet Take 1,000 mg by mouth every evening.     . montelukast (SINGULAIR) 10 MG tablet Take 1 tablet (10 mg total) by mouth daily. MUST SCHEDULE PHYSICAL EXAM 90 tablet 0  . Omega-3 Fatty Acids (FISH OIL PO) Take 1 capsule by mouth daily.    Marland Kitchen omeprazole (PRILOSEC) 40 MG capsule TAKE 1 CAPSULE DAILY 90 capsule 0   No  current facility-administered medications for this visit.     Allergies  Allergen Reactions  . Bee Pollen Cough  . Pollen Extract Cough  . Latex Itching, Rash and Other (See Comments)    Severe itching    Family History  Problem Relation Age of Onset  . Mental illness Mother   . Hypertension Mother   . Schizophrenia Mother   . Depression Mother   . Cancer Father        LUNG  . Cancer Sister        BREAST  . Breast cancer Sister   . Mental illness Brother   . Hypertension Brother   . Schizophrenia Brother   . Depression Brother   . Heart disease Maternal Grandmother   . Depression Maternal Grandmother   . Schizophrenia Maternal Grandmother   . Schizophrenia Brother   . Schizophrenia Maternal Uncle   . Breast cancer Maternal Aunt     Social History   Socioeconomic History  . Marital status: Married    Spouse name: Not on file  . Number of children: Not on file  . Years of education: Not on file  . Highest education level: Not on file  Occupational History  . Not on file  Social Needs  . Financial resource strain: Not on file  . Food insecurity:    Worry: Not on file    Inability: Not on file  . Transportation needs:    Medical: Not on file    Non-medical: Not on file  Tobacco Use  . Smoking status: Former Smoker    Packs/day: 0.25    Years: 6.00    Pack years: 1.50  . Smokeless tobacco: Never Used  . Tobacco comment: QUIT IN HER EARLY 20'S  Substance and Sexual Activity  . Alcohol use: No    Alcohol/week: 0.0 standard drinks  . Drug use: No  . Sexual activity: Not Currently  Lifestyle  . Physical activity:    Days per week: Not on file    Minutes per session: Not on file  . Stress: Not on file  Relationships  . Social connections:    Talks on phone: Not on file    Gets together: Not on file    Attends religious service: Not on file    Active member of club or organization: Not on file    Attends meetings of clubs or organizations: Not on file     Relationship status: Not on file  . Intimate partner violence:    Fear of current or ex partner: Not on file    Emotionally abused: Not on file    Physically abused: Not on file    Forced sexual activity: Not on file  Other Topics Concern  . 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 Denies fever or abrupt weight changes.  HEENT: Denies eye pain, eye redness, ear pain, ringing in the ears, wax buildup, runny nose, nasal congestion, bloody nose, or sore throat. Respiratory: Pt reports intermittent shortness of breath. Denies difficulty breathing,  cough or sputum production.   Cardiovascular: Denies chest pain, chest tightness, palpitations or swelling in the hands or feet.  Gastrointestinal: Denies abdominal pain, bloating, constipation, diarrhea or blood in the stool.  GU: Denies urgency, frequency, pain with urination, burning sensation, blood in urine, odor or discharge. Musculoskeletal: Denies decrease in range of motion, difficulty with gait, muscle pain or joint pain and swelling.  Skin: Denies redness, rashes, lesions or ulcercations.  Neurological: Pt reports lightheadedness. Denies dizziness, difficulty with memory, difficulty with speech or problems with balance and coordination.    No other specific complaints in a complete review of systems (except as listed in HPI above).   LMP 02/02/2017 (Approximate)  Wt Readings from Last 3 Encounters:  02/07/19 249 lb (112.9 kg)  01/05/19 247 lb 11.2 oz (112.4 kg)  12/28/18 247 lb 11.2 oz (112.4 kg)    General: Appears her stated age, obese, in NAD. Pulmonary/Chest: Normal effort. No respiratory distress.  Musculoskeletal: Normal flexion, extension and  rotation of the cervical spine, without pain. Neurological: Alert and oriented. Coordination normal.  Psychiatric: Mildly anxious appearing.  BMET    Component Value Date/Time   NA 138 03/21/2019 1449   K 3.1 (L) 03/21/2019 1449   CL 101 03/21/2019 1449   CO2 30 03/21/2019 1449   GLUCOSE 85 03/21/2019 1449   BUN 17 03/21/2019 1449   CREATININE 0.98 03/21/2019 1449   CALCIUM 9.0 03/21/2019 1449   GFRNONAA >60 12/28/2018 1043   GFRAA >60 12/28/2018 1043    Lipid Panel     Component Value Date/Time   CHOL 161 03/21/2019 1449   TRIG 109.0 03/21/2019 1449   HDL 67.80 03/21/2019 1449   CHOLHDL 2 03/21/2019 1449   VLDL 21.8 03/21/2019 1449   LDLCALC 71 03/21/2019 1449    CBC    Component Value Date/Time   WBC 7.1 03/21/2019 1449   RBC 4.01 03/21/2019 1449   HGB 10.6 (L) 03/21/2019 1449   HCT 32.9 (L) 03/21/2019 1449   PLT 285.0 03/21/2019 1449   MCV 82.1 03/21/2019 1449   MCH 26.3 12/28/2018 1043   MCHC 32.3 03/21/2019 1449   RDW 15.0 03/21/2019 1449   LYMPHSABS 1.7 01/14/2014 1224   MONOABS 0.3 01/14/2014 1224   EOSABS 0.1 01/14/2014 1224   BASOSABS 0.0 01/14/2014 1224    Hgb A1C Lab Results  Component Value Date   HGBA1C 6.5 03/21/2019        Assessment and Plan:  ER Follow Up for Acute Headache, Lightheadedness, ETD Dysfunction, Intermittent SOB, Malaise:  ER notes reviewed She will continue Flonase as prescribed I really think most of this is allergy/asthma related but she is insistent about COVID 10 IgG testing- will order She is overall improving Will follow up after lab results are back  Return/ER precautions discussed  Follow Up Instructions:    I discussed the assessment and  treatment plan with the patient. The patient was provided an opportunity to ask questions and all were answered. The patient agreed with the plan and demonstrated an understanding of the instructions.   The patient was advised to call back or seek an in-person  evaluation if the symptoms worsen or if the condition fails to improve as anticipated.     Webb Silversmith, NP

## 2019-04-03 ENCOUNTER — Encounter: Payer: Self-pay | Admitting: Internal Medicine

## 2019-04-03 DIAGNOSIS — E269 Hyperaldosteronism, unspecified: Secondary | ICD-10-CM | POA: Diagnosis not present

## 2019-04-04 ENCOUNTER — Other Ambulatory Visit: Payer: Self-pay

## 2019-04-04 ENCOUNTER — Other Ambulatory Visit: Payer: Medicare HMO

## 2019-04-14 DIAGNOSIS — J4521 Mild intermittent asthma with (acute) exacerbation: Secondary | ICD-10-CM | POA: Diagnosis not present

## 2019-04-20 ENCOUNTER — Telehealth: Payer: Self-pay

## 2019-04-20 DIAGNOSIS — J4540 Moderate persistent asthma, uncomplicated: Secondary | ICD-10-CM

## 2019-04-20 NOTE — Telephone Encounter (Signed)
We could change her to Maria Franklin.  We will have to get a complete blood count before doing this, however.  Orders placed.  Could you tell her to go to [place] when she is able.   Salvatore Marvel, MD [facility]

## 2019-04-20 NOTE — Telephone Encounter (Signed)
Patient notified of orders place for CBC with Diff. And will get it done on Monday 04/23/2019. Patient is wanting to know if she will be doing the paperwork like she did for Dupixent.

## 2019-04-20 NOTE — Telephone Encounter (Signed)
Patient had sx started in march stopped and started back 04/06/2019 on dupixent q 2 weeks. C/O hypotension, lightheaded, ankle pain/joint pain, burning sensation, and patient discontinued medication of Dupixent.

## 2019-04-20 NOTE — Telephone Encounter (Signed)
Patient start dupixent ack in January then stopped then again in March as she finally got approved for the copay program.  The patient is having some side effects that she is concerned with. She called dupixent and they told her to contact us.   Symptoms : Light Headed (2 Weeks or more) that has since stopped, BP Low, Right Ankle is hurting and burning like a restless leg feeling.   Patient doesn't want to come back in for injections at this time.    Please Advise.

## 2019-04-20 NOTE — Telephone Encounter (Signed)
Can we get a better idea of this hypotension? Does she have a blood pressure cuff at home? Was she having these issues when she got her first injections back in January? I honestly have not heard of these side effects at all.   The most concerning thing here is the hypotension. If it is truly low pressure, she needs to seek emergency care. She should at least call her PCP to discuss.   Maybe we should add her as a telephone visit this afternoon?   Salvatore Marvel, MD [facility]

## 2019-04-20 NOTE — Addendum Note (Signed)
Addended by: Valentina Shaggy on: 04/20/2019 03:49 PM   Modules accepted: Orders

## 2019-04-20 NOTE — Telephone Encounter (Signed)
Hypotension started last week 118/65 which is low for her, and her average 139/86. Patient states she rash around the ankles and varicose vein look a like back in January. Patient states she only gets this experience after getting her dupixent. She is having burning sensation and lightheadedness at the time she received her dupixent injection and she contacted the company c/o issue and pharmacy company notified patient to contact Dr. Ernst Bowler. Patient only wanted to let Dr. Ernst Bowler doesn't want to be seen in the office or have any telemedicine visit. Patient is only wondering if this will be discontinued and other options may come available. Please advise.

## 2019-04-20 NOTE — Telephone Encounter (Signed)
Dr. Ernst Bowler, do you have any recommendations that I can give the patient?

## 2019-04-23 NOTE — Telephone Encounter (Signed)
I am sure that Tammy will get her whatever forms that she needs.  Salvatore Marvel, MD [facility] [place]

## 2019-04-23 NOTE — Telephone Encounter (Signed)
I have her chart pulled and will keep eye out for CBC

## 2019-04-26 NOTE — Telephone Encounter (Signed)
Thanks Tammy!   Zuleica Seith, MD [facility]  

## 2019-05-03 DIAGNOSIS — E876 Hypokalemia: Secondary | ICD-10-CM | POA: Diagnosis not present

## 2019-05-03 DIAGNOSIS — E269 Hyperaldosteronism, unspecified: Secondary | ICD-10-CM | POA: Diagnosis not present

## 2019-05-03 DIAGNOSIS — D3502 Benign neoplasm of left adrenal gland: Secondary | ICD-10-CM | POA: Diagnosis not present

## 2019-05-03 DIAGNOSIS — I10 Essential (primary) hypertension: Secondary | ICD-10-CM | POA: Diagnosis not present

## 2019-05-03 DIAGNOSIS — J449 Chronic obstructive pulmonary disease, unspecified: Secondary | ICD-10-CM | POA: Diagnosis not present

## 2019-05-03 DIAGNOSIS — E039 Hypothyroidism, unspecified: Secondary | ICD-10-CM | POA: Diagnosis not present

## 2019-05-07 ENCOUNTER — Ambulatory Visit (INDEPENDENT_AMBULATORY_CARE_PROVIDER_SITE_OTHER): Payer: Medicare HMO | Admitting: Internal Medicine

## 2019-05-07 ENCOUNTER — Encounter: Payer: Self-pay | Admitting: Internal Medicine

## 2019-05-07 VITALS — BP 138/84 | HR 79 | Temp 98.4°F

## 2019-05-07 DIAGNOSIS — R1032 Left lower quadrant pain: Secondary | ICD-10-CM | POA: Diagnosis not present

## 2019-05-07 DIAGNOSIS — M545 Low back pain, unspecified: Secondary | ICD-10-CM

## 2019-05-07 DIAGNOSIS — R238 Other skin changes: Secondary | ICD-10-CM | POA: Diagnosis not present

## 2019-05-07 DIAGNOSIS — Z20828 Contact with and (suspected) exposure to other viral communicable diseases: Secondary | ICD-10-CM

## 2019-05-07 DIAGNOSIS — Z20822 Contact with and (suspected) exposure to covid-19: Secondary | ICD-10-CM

## 2019-05-07 NOTE — Progress Notes (Signed)
Virtual Visit via Video Note  I connected with Maria Franklin on 05/07/19 at 10:00 AM EDT by a video enabled telemedicine application and verified that I am speaking with the correct person using two identifiers.  Location: Patient: Home Provider: Office   I discussed the limitations of evaluation and management by telemedicine and the availability of in person appointments. The patient expressed understanding and agreed to proceed.  History of Present Illness:  Pt reports left lower back pain and left lower abdominal pain. This started 5-6 days ago. She describes the pain as aching and throbbing. The pain does not radiate. The pain is worse with laying down. She reports the skin is sensitive to touch but she has not noticed a rash. She denies nausea, vomiting, diarrhea, constipation or blood in her stool. She denies urinary or vaginal complaints. She did take a laxative and had a normal BM, but this did not improve her pain. She denies fever, chills or body aches. She denies any injury that she is aware of. She has tried Ibuprofen with some relief. She has never had shingles. She had a colonoscopy in 2013, which was normal per her report (no history of diverticulosis). She has had contact with someone who tested positive for COVID 19.   Past Medical History:  Diagnosis Date  . Allergy   . Anemia   . Angio-edema   . Asthma    ALL THE TIME   LAST FLARE UP 12/2018  . Chicken pox   . Diabetes mellitus without complication ([place])   . GERD (gastroesophageal reflux disease)   . Hyperlipidemia   . Hypertension   . Hyperthyroidism   . Recurrent upper respiratory infection (URI)   . Schizophrenia ([place])   . Sleep apnea    DX 2-3 YRS AGO.  Marland Kitchen Thyroid disease   . Urticaria     Current Outpatient Medications  Medication Sig Dispense Refill  . albuterol (PROVENTIL) (2.5 MG/3ML) 0.083% nebulizer solution Take 3 mLs (2.5 mg total) by nebulization every 4 (four) hours as needed for wheezing or  shortness of breath. 75 mL 11  . albuterol (VENTOLIN HFA) 108 (90 Base) MCG/ACT inhaler INHALE ONE TO TWO PUFFS INTO LUNGS EVERY 6 HOURS AS NEEDED FOR WHEEZING OR SHORTNESS OF BREATH 18 each 1  . amLODipine (NORVASC) 10 MG tablet Take 1 tablet (10 mg total) by mouth daily. MUST SCHEDULE PHYSICAL EXAM 90 tablet 0  . Apoaequorin (PREVAGEN EXTRA STRENGTH) 20 MG CAPS Take 20 mg by mouth daily.    Marland Kitchen atorvastatin (LIPITOR) 10 MG tablet TAKE 1 TABLET DAILY AT 6PM 90 tablet 0  . BIOTIN PO Take 1 tablet by mouth daily.    . budesonide-formoterol (SYMBICORT) 160-4.5 MCG/ACT inhaler Inhale 2 puffs into the lungs 2 (two) times daily. 1 Inhaler 5  . carvedilol (COREG) 12.5 MG tablet Take 1 tablet (12.5 mg total) by mouth 2 (two) times daily with a meal. MUST SCHEDULE ANNUAL PHYSICAL 180 tablet 0  . EPINEPHrine 0.3 mg/0.3 mL IJ SOAJ injection Inject 0.3 mg into the muscle once.     . ferrous sulfate 325 (65 FE) MG tablet Take 1 tablet (325 mg total) by mouth daily with breakfast. 30 tablet 2  . haloperidol (HALDOL) 1 MG tablet Take 1 tablet (1 mg total) by mouth at bedtime. 90 tablet 1  . hydrALAZINE (APRESOLINE) 10 MG tablet Take 1 tablet (10 mg total) by mouth 2 (two) times daily. MUST SCHEDULE ANNUAL PHYSICAL 180 tablet 0  . levothyroxine (  SYNTHROID, LEVOTHROID) 50 MCG tablet Take 1 tablet (50 mcg total) by mouth daily before breakfast. MUST SCHEDULE ANNUAL PHYSICAL 90 tablet 0  . lisinopril (PRINIVIL,ZESTRIL) 40 MG tablet Take 1 tablet (40 mg total) by mouth daily. MUST SCHEDULE PHYSICAL EXAM 90 tablet 0  . metFORMIN (GLUCOPHAGE-XR) 500 MG 24 hr tablet Take 1,000 mg by mouth every evening.     . montelukast (SINGULAIR) 10 MG tablet Take 1 tablet (10 mg total) by mouth daily. MUST SCHEDULE PHYSICAL EXAM 90 tablet 0  . Omega-3 Fatty Acids (FISH OIL PO) Take 1 capsule by mouth daily.    Marland Kitchen omeprazole (PRILOSEC) 40 MG capsule TAKE 1 CAPSULE DAILY 90 capsule 0   No current facility-administered medications for  this visit.     Allergies  Allergen Reactions  . Bee Pollen Cough  . Pollen Extract Cough  . Latex Itching, Rash and Other (See Comments)    Severe itching    Family History  Problem Relation Age of Onset  . Mental illness Mother   . Hypertension Mother   . Schizophrenia Mother   . Depression Mother   . Cancer Father        LUNG  . Cancer Sister        BREAST  . Breast cancer Sister   . Mental illness Brother   . Hypertension Brother   . Schizophrenia Brother   . Depression Brother   . Heart disease Maternal Grandmother   . Depression Maternal Grandmother   . Schizophrenia Maternal Grandmother   . Schizophrenia Brother   . Schizophrenia Maternal Uncle   . Breast cancer Maternal Aunt     Social History   Socioeconomic History  . Marital status: Married    Spouse name: Not on file  . Number of children: Not on file  . Years of education: Not on file  . Highest education level: Not on file  Occupational History  . Not on file  Social Needs  . Financial resource strain: Not on file  . Food insecurity:    Worry: Not on file    Inability: Not on file  . Transportation needs:    Medical: Not on file    Non-medical: Not on file  Tobacco Use  . Smoking status: Former Smoker    Packs/day: 0.25    Years: 6.00    Pack years: 1.50  . Smokeless tobacco: Never Used  . Tobacco comment: QUIT IN HER EARLY 20'S  Substance and Sexual Activity  . Alcohol use: No    Alcohol/week: 0.0 standard drinks  . Drug use: No  . Sexual activity: Not Currently  Lifestyle  . Physical activity:    Days per week: Not on file    Minutes per session: Not on file  . Stress: Not on file  Relationships  . Social connections:    Talks on phone: Not on file    Gets together: Not on file    Attends religious service: Not on file    Active member of club or organization: Not on file    Attends meetings of clubs or organizations: Not on file    Relationship status: Not on file  .  Intimate partner violence:    Fear of current or ex partner: Not on file    Emotionally abused: Not on file    Physically abused: Not on file    Forced sexual activity: Not on file  Other Topics Concern  . 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  HEENT: Denies eye pain, eye redness, ear pain, ringing in the ears, wax buildup, runny nose, nasal congestion, bloody nose, or sore throat. Respiratory: Denies difficulty breathing, shortness of breath, cough or sputum production.   Cardiovascular: Denies chest pain, chest tightness, palpitations or swelling in the hands or feet.  Gastrointestinal: Pt reports left lower abdominal pain. Denies  bloating, constipation, diarrhea or blood in the stool.  GU: Denies urgency, frequency, pain with urination, burning sensation, blood in urine, odor or discharge. Musculoskeletal: Pt reports left lower back pain. Denies decrease in range of motion, difficulty with gait, or joint pain and swelling.  Skin: Pt reports skin sensitivity of left flank. Denies redness, rashes, lesions or ulcercations.  Neurological: Denies dizziness, difficulty with memory, difficulty with speech or problems with balance and coordination.    No other specific complaints in a complete review of systems (except as listed in HPI above).  BP 138/84   Pulse 79   Temp 98.4 F (36.9 C) (Oral)   LMP 02/02/2017 (Approximate)  Wt Readings from Last 3 Encounters:  02/07/19 249 lb (112.9 kg)  01/05/19 247 lb 11.2 oz (112.4 kg)  12/28/18 247 lb 11.2 oz (112.4 kg)    General: Appears her stated age, obese, in NAD. Skin: Warm, dry and intact. No rashes noted. HEENT: Head: normal shape and  size; Eyes: sclera white, EOMs intact;  Pulmonary/Chest: Normal effort. No respiratory distress.  Abdomen: No obvious distention or masses noted.  Neurological: Alert and oriented.   BMET    Component Value Date/Time   NA 138 03/21/2019 1449   K 3.1 (L) 03/21/2019 1449   CL 101 03/21/2019 1449   CO2 30 03/21/2019 1449   GLUCOSE 85 03/21/2019 1449   BUN 17 03/21/2019 1449   CREATININE 0.98 03/21/2019 1449   CALCIUM 9.0 03/21/2019 1449   GFRNONAA >60 12/28/2018 1043   GFRAA >60 12/28/2018 1043    Lipid Panel     Component Value Date/Time   CHOL 161 03/21/2019 1449   TRIG 109.0 03/21/2019 1449   HDL 67.80 03/21/2019 1449   CHOLHDL 2 03/21/2019 1449   VLDL 21.8 03/21/2019 1449   LDLCALC 71 03/21/2019 1449    CBC    Component Value Date/Time   WBC 7.1 03/21/2019 1449   RBC 4.01 03/21/2019 1449   HGB 10.6 (L) 03/21/2019 1449   HCT 32.9 (L) 03/21/2019 1449   PLT 285.0 03/21/2019 1449   MCV 82.1 03/21/2019 1449   MCH 26.3 12/28/2018 1043   MCHC 32.3 03/21/2019 1449   RDW 15.0 03/21/2019 1449   LYMPHSABS 1.7 01/14/2014 1224   MONOABS 0.3 01/14/2014 1224   EOSABS 0.1 01/14/2014 1224   BASOSABS 0.0 01/14/2014 1224    Hgb A1C Lab Results  Component Value Date   HGBA1C 6.5 03/21/2019        Assessment and Plan:  Left Lower Back Pain, Left Lower Abdominal Pain, Skin Sensitivity, Positive COVID Contact:  Advised she will need labs, possible KUB vs CT abdomen I can not bring her into the office knowing she has had contact with COVID 10 positive patient Offered to have her tested for COVID at a drive up  site, once that comes back (if negative), could have her schedule lab only appt for labs and imagine She feels like her pain is to the point where she can not wait DDX include pancreatitis, diverticulitis, kidney stone, ovarian cyst, shingles without a rash She will proceed to UC for further evaluation at this time.  Return precautions discussed  Follow Up  Instructions:    I discussed the assessment and treatment plan with the patient. The patient was provided an opportunity to ask questions and all were answered. The patient agreed with the plan and demonstrated an understanding of the instructions.   The patient was advised to call back or seek an in-person evaluation if the symptoms worsen or if the condition fails to improve as anticipated.    Webb Silversmith, NP

## 2019-05-07 NOTE — Patient Instructions (Signed)

## 2019-05-08 ENCOUNTER — Ambulatory Visit (HOSPITAL_COMMUNITY)
Admission: EM | Admit: 2019-05-08 | Discharge: 2019-05-08 | Disposition: A | Discharge status: Home / Self Care | Payer: Medicare HMO | Attending: Emergency Medicine | Admitting: Emergency Medicine

## 2019-05-08 ENCOUNTER — Other Ambulatory Visit: Payer: Self-pay

## 2019-05-08 ENCOUNTER — Encounter (HOSPITAL_COMMUNITY): Payer: Self-pay | Admitting: Emergency Medicine

## 2019-05-08 DIAGNOSIS — M62830 Muscle spasm of back: Secondary | ICD-10-CM

## 2019-05-08 DIAGNOSIS — R109 Unspecified abdominal pain: Secondary | ICD-10-CM

## 2019-05-08 LAB — POCT URINALYSIS DIP (DEVICE)
Bilirubin Urine: NEGATIVE
Glucose, UA: NEGATIVE mg/dL
Hgb urine dipstick: NEGATIVE
Ketones, ur: NEGATIVE mg/dL
Nitrite: NEGATIVE
Protein, ur: 30 mg/dL — AB
Specific Gravity, Urine: 1.025 (ref 1.005–1.030)
Urobilinogen, UA: 0.2 mg/dL (ref 0.0–1.0)
pH: 6 (ref 5.0–8.0)

## 2019-05-08 MED ORDER — CYCLOBENZAPRINE HCL 10 MG PO TABS: 10.0000 mg | ORAL_TABLET | Freq: Every day | ORAL | 0 refills | Status: DC

## 2019-05-08 MED ORDER — MELOXICAM 7.5 MG PO TABS: ORAL_TABLET | ORAL | 0 refills | Status: DC

## 2019-05-08 NOTE — ED Provider Notes (Signed)
[facility]    CSN: [id number] Arrival date & time: 05/08/19  1242     History   Chief Complaint Chief Complaint  Patient presents with   Abdominal Pain    HPI Maria Franklin is a 58 y.o. female.  PCP did telemedicine visit on 05/07/2019:  "Assessment and Plan: Left Lower Back Pain, Left Lower Abdominal Pain, Skin Sensitivity....... She feels like her pain is to the point where she can not wait DDX include pancreatitis, diverticulitis, kidney stone, ovarian cyst, shingles without a rash She will proceed to UC for further evaluation at this time."    Flank Pain  This is a new problem. Episode onset: Left mid back, left flank and left upper quadrant pain x5 days. The problem occurs constantly. Progression since onset: Waxes and wanes. Associated symptoms include abdominal pain (Left upper lateral abdominal pain, but no other abdominal pain). Pertinent negatives include no chest pain, no headaches and no shortness of breath. The symptoms are aggravated by bending and twisting (Sometimes worse when lying down and trying to sleep at night). The symptoms are relieved by position and rest (Warm bath improves it somewhat). She has tried a warm compress for the symptoms. The treatment provided mild relief.   I tried to clarify location of her pain and its mostly throbbing in the left flank and left mid back and she feels it also includes the left upper lateral abdomen.  Pain level currently 4 or 5 out of 10.  Was up to 7 out of 10 yesterday, tried Motrin and moist heat and that helped a little.  Denies any other abdominal pain.   Denies any rash, vaginal or GYN symptoms. No myalgias, rash, fever or chills or nausea or vomiting.  No blood in stool or melena.  She felt constipated about 5 days ago, but took a laxative and had normal BM this morning. Denies ever having kidney stone. Denies hematuria, urinary frequency, dysuria. Denies any lower abdominal or pelvic pain. Appetite  has been normal. There was a question of COVID exposure to her father-in-law, but she denies having direct exposure to her father-in-law. She denies any history of abdominal surgery in the past. Past Medical History:  Diagnosis Date   Allergy    Anemia    Angio-edema    Asthma    ALL THE TIME   LAST FLARE UP 12/2018   Chicken pox    Diabetes mellitus without complication (HCC)    GERD (gastroesophageal reflux disease)    Hyperlipidemia    Hypertension    Hyperthyroidism    Recurrent upper respiratory infection (URI)    Schizophrenia (HCC)    Sleep apnea    DX 2-3 YRS AGO.   Thyroid disease    Urticaria     Patient Active Problem List   Diagnosis Date Noted   Sinusitis with nasal polyps 01/05/2019   OSA (obstructive sleep apnea) 04/29/2016   GERD (gastroesophageal reflux disease) 10/28/2015   Asthma, mild persistent 04/21/2015   Paranoid schizophrenia, chronic condition ([place]) 04/19/2014   Seasonal allergies 04/19/2014   DM2 (diabetes mellitus, type 2) ([place]) 04/19/2014   Angina, [place] 12/30/2013   Hypertension 01/10/2013   Hyperlipidemia 01/10/2013   Hypothyroidism 01/10/2013    Past Surgical History:  Procedure Laterality Date   SINOSCOPY     SINUS ENDO W/FUSION Bilateral 01/05/2019   Procedure: ENDOSCOPIC SINUS SURGERY WITH NAVIGATION;  Surgeon: Jerrell Belfast, MD;  Location: [facility];  Service: ENT;  Laterality: Bilateral;   SINUS  IRRIGATION     TONSILLECTOMY     TURBINATE REDUCTION N/A 01/05/2019   Procedure: Turbinate Reduction;  Surgeon: Jerrell Belfast, MD;  Location: [facility];  Service: ENT;  Laterality: N/A;    OB History   No obstetric history on file.      Home Medications    Prior to Admission medications   Medication Sig Start Date End Date Taking? Authorizing Provider  albuterol (PROVENTIL) (2.5 MG/3ML) 0.083% nebulizer solution Take 3 mLs (2.5 mg total) by nebulization every 4 (four) hours as needed for wheezing or  shortness of breath. 12/14/18   Valentina Shaggy, MD  albuterol (VENTOLIN HFA) 108 (90 Base) MCG/ACT inhaler INHALE ONE TO TWO PUFFS INTO LUNGS EVERY 6 HOURS AS NEEDED FOR WHEEZING OR SHORTNESS OF BREATH 02/07/19   Jearld Fenton, NP  amLODipine (NORVASC) 10 MG tablet Take 1 tablet (10 mg total) by mouth daily. MUST SCHEDULE PHYSICAL EXAM 02/07/19   Jearld Fenton, NP  Apoaequorin (PREVAGEN EXTRA STRENGTH) 20 MG CAPS Take 20 mg by mouth daily.    [provider]  atorvastatin (LIPITOR) 10 MG tablet TAKE 1 TABLET DAILY AT 6PM 02/12/19   Jearld Fenton, NP  BIOTIN PO Take 1 tablet by mouth daily.    [provider]  budesonide-formoterol (SYMBICORT) 160-4.5 MCG/ACT inhaler Inhale 2 puffs into the lungs 2 (two) times daily. 12/14/18   Valentina Shaggy, MD  carvedilol (COREG) 12.5 MG tablet Take 1 tablet (12.5 mg total) by mouth 2 (two) times daily with a meal. MUST SCHEDULE ANNUAL PHYSICAL 02/07/19   Jearld Fenton, NP  cyclobenzaprine (FLEXERIL) 10 MG tablet Take 1 tablet (10 mg total) by mouth at bedtime. For muscle relaxant 05/08/19   Jacqulyn Cane, MD  EPINEPHrine 0.3 mg/0.3 mL IJ SOAJ injection Inject 0.3 mg into the muscle once.  02/07/18   [provider]  ferrous sulfate 325 (65 FE) MG tablet Take 1 tablet (325 mg total) by mouth daily with breakfast. 03/27/19   Jearld Fenton, NP  haloperidol (HALDOL) 1 MG tablet Take 1 tablet (1 mg total) by mouth at bedtime. 01/09/19 01/09/20  Arfeen, Arlyce Harman, MD  hydrALAZINE (APRESOLINE) 10 MG tablet Take 1 tablet (10 mg total) by mouth 2 (two) times daily. MUST SCHEDULE ANNUAL PHYSICAL 02/07/19   Jearld Fenton, NP  levothyroxine (SYNTHROID, LEVOTHROID) 50 MCG tablet Take 1 tablet (50 mcg total) by mouth daily before breakfast. MUST SCHEDULE ANNUAL PHYSICAL 02/07/19   Jearld Fenton, NP  lisinopril (PRINIVIL,ZESTRIL) 40 MG tablet Take 1 tablet (40 mg total) by mouth daily. MUST SCHEDULE PHYSICAL EXAM 02/07/19   Jearld Fenton, NP  meloxicam  (MOBIC) 7.5 MG tablet Take 1 twice a day as needed for pain. Take with food. (Do not take with any other NSAID.) 05/08/19   Jacqulyn Cane, MD  metFORMIN (GLUCOPHAGE-XR) 500 MG 24 hr tablet Take 1,000 mg by mouth every evening.  10/10/18   [provider]  montelukast (SINGULAIR) 10 MG tablet Take 1 tablet (10 mg total) by mouth daily. MUST SCHEDULE PHYSICAL EXAM 02/07/19   Jearld Fenton, NP  Omega-3 Fatty Acids (FISH OIL PO) Take 1 capsule by mouth daily.    [provider]  omeprazole (PRILOSEC) 40 MG capsule TAKE 1 CAPSULE DAILY 02/12/19   Jearld Fenton, NP    Family History Family History  Problem Relation Age of Onset   Mental illness Mother    Hypertension Mother    Schizophrenia Mother  Depression Mother    Cancer Father        LUNG   Cancer Sister        BREAST   Breast cancer Sister    Mental illness Brother    Hypertension Brother    Schizophrenia Brother    Depression Brother    Heart disease Maternal Grandmother    Depression Maternal Grandmother    Schizophrenia Maternal Grandmother    Schizophrenia Brother    Schizophrenia Maternal Uncle    Breast cancer Maternal Aunt     Social History Social History   Tobacco Use   Smoking status: Former Smoker    Packs/day: 0.25    Years: 6.00    Pack years: 1.50   Smokeless tobacco: Never Used   Tobacco comment: QUIT IN HER EARLY 20'S  Substance Use Topics   Alcohol use: No    Alcohol/week: 0.0 standard drinks   Drug use: No     Allergies   Bee pollen; Pollen extract; and Latex   Review of Systems Review of Systems  Respiratory: Negative for shortness of breath.   Cardiovascular: Negative for chest pain.  Gastrointestinal: Positive for abdominal pain (Left upper lateral abdominal pain, but no other abdominal pain).  Genitourinary: Positive for flank pain.  Neurological: Negative for headaches.  All other systems reviewed and are negative.  Pertinent items noted in HPI  and remainder of comprehensive ROS otherwise negative.   Physical Exam Triage Vital Signs ED Triage Vitals  Enc Vitals Group     BP 05/08/19 1258 138/86     Pulse Rate 05/08/19 1258 61     Resp 05/08/19 1258 18     Temp 05/08/19 1258 98.4 F (36.9 C)     Temp Source 05/08/19 1258 Oral     SpO2 05/08/19 1258 100 %     Weight --      Height --      Head Circumference --      Peak Flow --      Pain Score 05/08/19 1257 6     Pain Loc --      Pain Edu? --      Excl. in [place]? --    No data found.  Updated Vital Signs BP 138/86 (BP Location: Right Arm)    Pulse 61    Temp 98.4 F (36.9 C) (Oral)    Resp 18    LMP 02/02/2017 (Approximate)    SpO2 100%    Physical Exam Vitals signs reviewed.  Constitutional:      General: She is not in acute distress.    Appearance: She is well-developed. She is not ill-appearing, toxic-appearing or diaphoretic.  HENT:     Head: Normocephalic and atraumatic.     Mouth/Throat:     Mouth: Mucous membranes are moist.     Pharynx: No pharyngeal swelling or oropharyngeal exudate.  Eyes:     General: No scleral icterus.    Pupils: Pupils are equal, round, and reactive to light.  Neck:     Musculoskeletal: Normal range of motion and neck supple.  Cardiovascular:     Rate and Rhythm: Normal rate and regular rhythm.     Heart sounds: Normal heart sounds. No murmur.  Pulmonary:     Effort: Pulmonary effort is normal.     Breath sounds: Normal breath sounds.  Abdominal:     General: Bowel sounds are normal. There is no distension or abdominal bruit.     Palpations: Abdomen is soft. There  is no hepatomegaly, splenomegaly, mass or pulsatile mass.     Tenderness: There is no abdominal tenderness. There is left CVA tenderness. There is no guarding or rebound. Negative signs include Murphy's sign, Rovsing's sign and McBurney's sign.     Hernia: There is no hernia in the ventral area.  Musculoskeletal:     Thoracic back: She exhibits decreased range of  motion, tenderness and spasm. She exhibits no bony tenderness, no swelling and no laceration.       Back:     Comments: No spinal tenderness or deformity.  Skin:    General: Skin is warm and dry.     Capillary Refill: Capillary refill takes less than 2 seconds.     Findings: No rash.  Neurological:     General: No focal deficit present.     Mental Status: She is alert and oriented to person, place, and time.     Cranial Nerves: No cranial nerve deficit.  Psychiatric:        Behavior: Behavior normal.      UC Treatments / Results  Labs (all labs ordered are listed, but only abnormal results are displayed) Labs Reviewed  POCT URINALYSIS DIP (DEVICE) - Abnormal; Notable for the following components:      Result Value   Protein, ur 30 (*)    Leukocytes,Ua TRACE (*)    All other components within normal limits    EKG None  Radiology No results found.  Procedures Procedures (including critical care time)  Medications Ordered in UC Medications - No data to display  Initial Impression / Assessment and Plan / UC Course  I have reviewed the triage vital signs and the nursing notes.  Pertinent labs & imaging results that were available during my care of the patient were reviewed by me and considered in my medical decision making (see chart for details).    Urinalysis shows no significant abnormality.  I doubt this could be kidney stone or UTI.  Because of trace leukocytes, will send for urine culture. No rash, given the duration of the pain, it is unlikely that this is shingles. Most likely, this is the muscle strain and spasm of the left parathoracic muscles, based on history and physical exam.-She has no bony tenderness, imaging not indicated in my opinion. No evidence of intra-abdominal process.  No evidence of surgical abdomen based on history and physical exam.   Final Clinical Impressions(s) / UC Diagnoses   Final diagnoses:  Back muscle spasm  Acute left flank  pain  She declined any blood work such as CBC.  She declined the option of going to emergency room for further evaluation and CT scanning.    Discharge Instructions     Urine test shows no sign of kidney stone.  No signs of infection. Your left flank and left back pain is likely from muscle spasm and muscle strain. Please read attached instructions on muscle cramps and spasms and back strain. Stop taking Motrin, I'm prescribing a stronger nonnarcotic NSAID for pain relief and a muscle relaxant for bedtime. Based on your history and physical exam, there is no sign of any intra-abdominal abnormality.  No sign of COVID based on the timing of your history and physical exam. If you develop severely worsening symptoms, go to emergency room. Of note, regular x-ray would not likely show anything that would change our management.  If you develop severely worsening symptoms, you need to go to emergency room for further evaluation and treatment, and possible  CT scanning, but I do not feel that is indicated at this time.    ED Prescriptions    Medication Sig Dispense Auth. Provider   cyclobenzaprine (FLEXERIL) 10 MG tablet Take 1 tablet (10 mg total) by mouth at bedtime. For muscle relaxant 10 tablet Jacqulyn Cane, MD   meloxicam (MOBIC) 7.5 MG tablet Take 1 twice a day as needed for pain. Take with food. (Do not take with any other NSAID.) 20 tablet Jacqulyn Cane, MD     An After Visit Summary was printed and given to the patient. Precautions discussed. Red flags discussed. Questions invited and answered. Patient voiced understanding and agreement.    Jacqulyn Cane, MD 05/08/19 2144

## 2019-05-08 NOTE — ED Triage Notes (Signed)
Pt presents to [facility] for assessment of LUQ and left flank pain, starting 1 week ago.  C/o some changes in stool, but denies diarrhea or constipation.  Denies n/v.  Denies changes in urination.

## 2019-05-08 NOTE — ED Notes (Signed)
Pt has had a family member recently in the past 2 days test positive for Covid but she is not displaying any symptoms

## 2019-05-08 NOTE — Discharge Instructions (Addendum)
Urine test shows no sign of kidney stone.  No signs of infection. Your left flank and left back pain is likely from muscle spasm and muscle strain. Please read attached instructions on muscle cramps and spasms and back strain. Stop taking Motrin, I'm prescribing a stronger nonnarcotic NSAID for pain relief and a muscle relaxant for bedtime. Based on your history and physical exam, there is no sign of any intra-abdominal abnormality.  No sign of COVID based on the timing of your history and physical exam. If you develop severely worsening symptoms, go to emergency room. Of note, regular x-ray would not likely show anything that would change our management.  If you develop severely worsening symptoms, you need to go to emergency room for further evaluation and treatment, and possible CT scanning, but I do not feel that is indicated at this time.

## 2019-05-09 ENCOUNTER — Encounter: Payer: Self-pay | Admitting: Internal Medicine

## 2019-05-09 ENCOUNTER — Ambulatory Visit (INDEPENDENT_AMBULATORY_CARE_PROVIDER_SITE_OTHER): Payer: Medicare HMO | Admitting: Internal Medicine

## 2019-05-09 DIAGNOSIS — R1032 Left lower quadrant pain: Secondary | ICD-10-CM | POA: Diagnosis not present

## 2019-05-09 DIAGNOSIS — M545 Low back pain, unspecified: Secondary | ICD-10-CM

## 2019-05-09 NOTE — Progress Notes (Signed)
Virtual Visit via Video Note  I connected with Maria Franklin on 05/09/19 at  3:45 PM EDT by a video enabled telemedicine application and verified that I am speaking with the correct person using two identifiers.  Location: Patient: Home Provider: Office   I discussed the limitations of evaluation and management by telemedicine and the availability of in person appointments. The patient expressed understanding and agreed to proceed.  History of Present Illness:  Pt due for UC followup. She went to [facility] 6/2 with left lower back pain, left lower abdominal pain. No labs were done other than urinalysis. They felt like her symptoms were muscular, did not feel like imaging was necessary. They gave her RX for Flexeril and Meloxicam, which she has taken with some relief. She does not feel like it is muscular. She reports the pain is not worse with movement, but can be worse with laying down. She denies nausea, vomiting, diarrhea, constipation or blood in her stool. She denies urinary or vaginal complaints. She denies fever, chills or body aches. She had some partially undigested food/pill in her stool this morning and wants to know if this is the cause of her pain. Her last colonoscopy was in 2013, normal per her report, no diverticulosis.   Past Medical History:  Diagnosis Date  . Allergy   . Anemia   . Angio-edema   . Asthma    ALL THE TIME   LAST FLARE UP 12/2018  . Chicken pox   . Diabetes mellitus without complication ([place])   . GERD (gastroesophageal reflux disease)   . Hyperlipidemia   . Hypertension   . Hyperthyroidism   . Recurrent upper respiratory infection (URI)   . Schizophrenia ([place])   . Sleep apnea    DX 2-3 YRS AGO.  Marland Kitchen Thyroid disease   . Urticaria     Current Outpatient Medications  Medication Sig Dispense Refill  . albuterol (PROVENTIL) (2.5 MG/3ML) 0.083% nebulizer solution Take 3 mLs (2.5 mg total) by nebulization every 4 (four) hours as needed for wheezing or shortness  of breath. 75 mL 11  . albuterol (VENTOLIN HFA) 108 (90 Base) MCG/ACT inhaler INHALE ONE TO TWO PUFFS INTO LUNGS EVERY 6 HOURS AS NEEDED FOR WHEEZING OR SHORTNESS OF BREATH 18 each 1  . amLODipine (NORVASC) 10 MG tablet Take 1 tablet (10 mg total) by mouth daily. MUST SCHEDULE PHYSICAL EXAM 90 tablet 0  . Apoaequorin (PREVAGEN EXTRA STRENGTH) 20 MG CAPS Take 20 mg by mouth daily.    Marland Kitchen atorvastatin (LIPITOR) 10 MG tablet TAKE 1 TABLET DAILY AT 6PM 90 tablet 0  . BIOTIN PO Take 1 tablet by mouth daily.    . budesonide-formoterol (SYMBICORT) 160-4.5 MCG/ACT inhaler Inhale 2 puffs into the lungs 2 (two) times daily. 1 Inhaler 5  . carvedilol (COREG) 12.5 MG tablet Take 1 tablet (12.5 mg total) by mouth 2 (two) times daily with a meal. MUST SCHEDULE ANNUAL PHYSICAL 180 tablet 0  . cyclobenzaprine (FLEXERIL) 10 MG tablet Take 1 tablet (10 mg total) by mouth at bedtime. For muscle relaxant 10 tablet 0  . EPINEPHrine 0.3 mg/0.3 mL IJ SOAJ injection Inject 0.3 mg into the muscle once.     . ferrous sulfate 325 (65 FE) MG tablet Take 1 tablet (325 mg total) by mouth daily with breakfast. 30 tablet 2  . haloperidol (HALDOL) 1 MG tablet Take 1 tablet (1 mg total) by mouth at bedtime. 90 tablet 1  . hydrALAZINE (APRESOLINE) 10 MG tablet Take  1 tablet (10 mg total) by mouth 2 (two) times daily. MUST SCHEDULE ANNUAL PHYSICAL 180 tablet 0  . levothyroxine (SYNTHROID, LEVOTHROID) 50 MCG tablet Take 1 tablet (50 mcg total) by mouth daily before breakfast. MUST SCHEDULE ANNUAL PHYSICAL 90 tablet 0  . lisinopril (PRINIVIL,ZESTRIL) 40 MG tablet Take 1 tablet (40 mg total) by mouth daily. MUST SCHEDULE PHYSICAL EXAM 90 tablet 0  . meloxicam (MOBIC) 7.5 MG tablet Take 1 twice a day as needed for pain. Take with food. (Do not take with any other NSAID.) 20 tablet 0  . metFORMIN (GLUCOPHAGE-XR) 500 MG 24 hr tablet Take 1,000 mg by mouth every evening.     . montelukast (SINGULAIR) 10 MG tablet Take 1 tablet (10 mg total) by  mouth daily. MUST SCHEDULE PHYSICAL EXAM 90 tablet 0  . Omega-3 Fatty Acids (FISH OIL PO) Take 1 capsule by mouth daily.    Marland Kitchen omeprazole (PRILOSEC) 40 MG capsule TAKE 1 CAPSULE DAILY 90 capsule 0   No current facility-administered medications for this visit.     Allergies  Allergen Reactions  . Bee Pollen Cough  . Pollen Extract Cough  . Latex Itching, Rash and Other (See Comments)    Severe itching    Family History  Problem Relation Age of Onset  . Mental illness Mother   . Hypertension Mother   . Schizophrenia Mother   . Depression Mother   . Cancer Father        LUNG  . Cancer Sister        BREAST  . Breast cancer Sister   . Mental illness Brother   . Hypertension Brother   . Schizophrenia Brother   . Depression Brother   . Heart disease Maternal Grandmother   . Depression Maternal Grandmother   . Schizophrenia Maternal Grandmother   . Schizophrenia Brother   . Schizophrenia Maternal Uncle   . Breast cancer Maternal Aunt     Social History   Socioeconomic History  . Marital status: Married    Spouse name: Not on file  . Number of children: Not on file  . Years of education: Not on file  . Highest education level: Not on file  Occupational History  . Not on file  Social Needs  . Financial resource strain: Not on file  . Food insecurity:    Worry: Not on file    Inability: Not on file  . Transportation needs:    Medical: Not on file    Non-medical: Not on file  Tobacco Use  . Smoking status: Former Smoker    Packs/day: 0.25    Years: 6.00    Pack years: 1.50  . Smokeless tobacco: Never Used  . Tobacco comment: QUIT IN HER EARLY 20'S  Substance and Sexual Activity  . Alcohol use: No    Alcohol/week: 0.0 standard drinks  . Drug use: No  . Sexual activity: Not Currently  Lifestyle  . Physical activity:    Days per week: Not on file    Minutes per session: Not on file  . Stress: Not on file  Relationships  . Social connections:    Talks on  phone: Not on file    Gets together: Not on file    Attends religious service: Not on file    Active member of club or organization: Not on file    Attends meetings of clubs or organizations: Not on file    Relationship status: Not on file  . Intimate partner violence:  Fear of current or ex partner: Not on file    Emotionally abused: Not on file    Physically abused: Not on file    Forced sexual activity: Not on file  Other Topics Concern  . 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  HEENT: Denies eye pain, eye redness, ear pain, ringing in the ears, wax buildup, runny nose, nasal congestion, bloody nose, or sore throat. Respiratory: Denies difficulty breathing, shortness of breath, cough or sputum production.   Cardiovascular: Denies chest pain, chest tightness, palpitations or swelling in the hands or feet.  Gastrointestinal: Pt reports LLQ abdominal pain. Denies bloating, constipation, diarrhea or blood in the stool.  GU: Denies urgency, frequency, pain with urination, burning sensation, blood in urine, odor or discharge. Musculoskeletal: Pt reports left lower back pain. Denies decrease in range of motion, difficulty with gait, or joint swelling.  Skin: Denies redness, rashes, lesions or ulcercations.  Neurological: Denies dizziness, difficulty with memory, difficulty with speech or problems with balance and coordination.  Psych: Denies anxiety, depression, SI/HI.  No other specific complaints in a complete review of systems (except as listed in HPI above).    Wt Readings from Last 3 Encounters:  02/07/19 249 lb (112.9 kg)  01/05/19 247 lb 11.2 oz (112.4 kg)  12/28/18 247 lb 11.2  oz (112.4 kg)    General: Appears her stated age, obese, in NAD. Skin: Warm, dry and intact. No rashes noted. HEENT: Head: normal shape and size; Eyes: sclera white and  EOMs intact;  Pulmonary/Chest: Normal effort. No respiratory distress.  Abdomen: No masses noted.  Neurological: Alert and oriented.   Psychiatric: Mildly anxious appearing  BMET    Component Value Date/Time   NA 138 03/21/2019 1449   K 3.1 (L) 03/21/2019 1449   CL 101 03/21/2019 1449   CO2 30 03/21/2019 1449   GLUCOSE 85 03/21/2019 1449   BUN 17 03/21/2019 1449   CREATININE 0.98 03/21/2019 1449   CALCIUM 9.0 03/21/2019 1449   GFRNONAA >60 12/28/2018 1043   GFRAA >60 12/28/2018 1043    Lipid Panel     Component Value Date/Time   CHOL 161 03/21/2019 1449   TRIG 109.0 03/21/2019 1449   HDL 67.80 03/21/2019 1449   CHOLHDL 2 03/21/2019 1449   VLDL 21.8 03/21/2019 1449   LDLCALC 71 03/21/2019 1449    CBC    Component Value Date/Time   WBC 7.1 03/21/2019 1449   RBC 4.01 03/21/2019 1449   HGB 10.6 (L) 03/21/2019 1449   HCT 32.9 (L) 03/21/2019 1449   PLT 285.0 03/21/2019 1449   MCV 82.1 03/21/2019 1449   MCH 26.3 12/28/2018 1043   MCHC 32.3 03/21/2019 1449   RDW 15.0 03/21/2019 1449   LYMPHSABS 1.7 01/14/2014 1224   MONOABS 0.3 01/14/2014 1224   EOSABS 0.1 01/14/2014 1224   BASOSABS 0.0 01/14/2014 1224    Hgb A1C Lab Results  Component Value Date   HGBA1C 6.5 03/21/2019        Assessment and Plan:  UC Follow Up for LLQ Pain, Left Lower Back Pain:  UC notes and labs reviewed She has only had 1 dose of Meloxicam and  muscle relaxer- will continue for now Advised her to try heat OTC If no improvement, consider lab workup, possible CT scan of abdomen  Return precautions discussed   Follow Up Instructions:    I discussed the assessment and treatment plan with the patient. The patient was provided an opportunity to ask questions and all were answered. The patient agreed with the plan  and demonstrated an understanding of the instructions.   The patient was advised to call back or seek an in-person evaluation if the symptoms worsen or if the condition fails to improve as anticipated.    Webb Silversmith, NP

## 2019-05-10 ENCOUNTER — Encounter: Payer: Self-pay | Admitting: Internal Medicine

## 2019-05-10 NOTE — Patient Instructions (Signed)

## 2019-05-15 DIAGNOSIS — J4521 Mild intermittent asthma with (acute) exacerbation: Secondary | ICD-10-CM | POA: Diagnosis not present

## 2019-06-04 ENCOUNTER — Ambulatory Visit: Payer: Self-pay | Admitting: Pulmonary Disease

## 2019-06-12 ENCOUNTER — Ambulatory Visit: Payer: Medicare HMO | Admitting: Allergy & Immunology

## 2019-06-14 ENCOUNTER — Telehealth: Payer: Medicare HMO | Admitting: Family

## 2019-06-14 DIAGNOSIS — E279 Disorder of adrenal gland, unspecified: Secondary | ICD-10-CM | POA: Diagnosis not present

## 2019-06-14 DIAGNOSIS — J4521 Mild intermittent asthma with (acute) exacerbation: Secondary | ICD-10-CM | POA: Diagnosis not present

## 2019-06-14 DIAGNOSIS — L709 Acne, unspecified: Secondary | ICD-10-CM

## 2019-06-14 DIAGNOSIS — E278 Other specified disorders of adrenal gland: Secondary | ICD-10-CM | POA: Diagnosis not present

## 2019-06-14 MED ORDER — CLINDAMYCIN PHOS-BENZOYL PEROX 1-5 % EX GEL: Freq: Two times a day (BID) | CUTANEOUS | 0 refills | Status: DC

## 2019-06-14 NOTE — Progress Notes (Signed)
Greater than 5 minutes, yet less than 10 minutes of time have been spent researching, coordinating, and implementing care for this patient today.  Thank you for the details you included in the comment boxes. Those details are very helpful in determining the best course of treatment for you and help [place] to provide the best care.  Your questionnaire states "severe facial swelling or redness." If it is severely swollen, and you have other areas such as your neck, arms, legs, or feet that are also swollen, be seen face to face. If your throat is very tight, also be seen face to face. The list of questions we ask is sometimes limited and this is probably not an issue; just wanted to make sure I covered all that. See plan below.  We are sorry that you are experiencing this issue.  Here is how we plan to help!  Based on what you shared with me it looks like you have uncomplicated acne.  Acne is a disorder of the hair follicles and oil glands (sebaceous glands). The sebaceous glands secrete oils to keep the skin moist.  When the glands get clogged, it can lead to pimples or cysts.  These cysts may become infected and leave scars. Acne is very common and normally occurs at puberty.  Acne is also inherited.  Your personal care plan consists of the following recommendations:  I recommend that you use a daily cleanser  You may try a topical exfoliator and salicylic acid scrub.  These scrubs have coarse particles that clear your pores but may also irritate your skin.  I have prescribed a topical gel with an antibiotic:  Clindamycin-benzoyl peroxide gel.  This gel should be applied to the affected areas twice a day.  Be sure to read the package insert to understand potential side effects.   If excessive dryness or peeling occurs, reduce dose frequency or concentration of the topical scrubs.  If excessive stinging or burning occurs, remove the topical gel with mild soap and water and resume at a lower dose the  next day.  Remember oral antibiotics and topical acne treatments may increase your sensitivity to the sun!  HOME CARE:  Do not squeeze pimples because that can often lead to infections, worse acne, and scars.  Use a moisturizer that contains retinoid or fruit acids that may inhibit the development of new acne lesions.  Although there is not a clear link that foods can cause acne, doctors do believe that too many sweets predispose you to skin problems.  GET HELP RIGHT AWAY IF:  If your acne gets worse or is not better within 10 days.  If you become depressed.  If you become pregnant, discontinue medications and call your OB/GYN.  MAKE SURE YOU:  Understand these instructions.  Will watch your condition.  Will get help right away if you are not doing well or get worse.   Your e-visit answers were reviewed by a board certified advanced clinical practitioner to complete your personal care plan.  Depending upon the condition, your plan could have included both over the counter or prescription medications.  Please review your pharmacy choice.  If there is a problem, you may contact your provider through [employer] and have the prescription routed to another pharmacy.  Your safety is important to [place].  If you have drug allergies check your prescription carefully.  For the next 24 hours you can use MyChart to ask questions about today's visit, request a non-urgent call back, or  ask for a work or school excuse from your e-visit provider.  You will get an email in the next two days asking about your experience. I hope that your e-visit has been valuable and will speed your recovery.

## 2019-06-27 ENCOUNTER — Other Ambulatory Visit: Payer: Self-pay

## 2019-06-27 ENCOUNTER — Ambulatory Visit (INDEPENDENT_AMBULATORY_CARE_PROVIDER_SITE_OTHER): Payer: Medicare HMO | Admitting: Psychiatry

## 2019-06-27 ENCOUNTER — Encounter (HOSPITAL_COMMUNITY): Payer: Self-pay | Admitting: Psychiatry

## 2019-06-27 DIAGNOSIS — F200 Paranoid schizophrenia: Secondary | ICD-10-CM

## 2019-06-27 DIAGNOSIS — R69 Illness, unspecified: Secondary | ICD-10-CM | POA: Diagnosis not present

## 2019-06-27 MED ORDER — HALOPERIDOL 1 MG PO TABS: 1.0000 mg | ORAL_TABLET | Freq: Every day | ORAL | 1 refills | Status: DC

## 2019-06-27 NOTE — Progress Notes (Signed)
Virtual Visit via Telephone Note  I connected with Maria Franklin on 06/27/19 at  2:00 PM EDT by telephone and verified that I am speaking with the correct person using two identifiers.   I discussed the limitations, risks, security and privacy concerns of performing an evaluation and management service by telephone and the availability of in person appointments. I also discussed with the patient that there may be a patient responsible charge related to this service. The patient expressed understanding and agreed to proceed.   History of Present Illness: Patient was evaluated by phone session.  She is taking Haldol 1 mg at bedtime.  She denies any hallucination or any paranoia.  She saw neurologist and she was told that she has no memory problem and she has use her CPAP mask every night.  Patient admitted not able to use her CPAP because due to COVID she is not able to get the new mask.  Overall she describes her mood is good.  She is having upcoming abdominal surgery for her adrenal glands and she was told it is possible she may have to stop some of her antihypertensive medication.  She is sleeping fair without CPAP mask.  She denies any irritability or any anger.  Recently she saw her 25-year-old grandchild who had a birthday.  Her son lives in [place] who also have 70-month-old child and she has been in contact with them on a regular basis.  She lives with her husband is very supportive.  She wants to continue low-dose Haldol.  She has no tremors, shakes or any EPS.  Since we cut down the dose of Haldol her memory is also improved.   Past Psychiatric History: Reviewed. History of paranoia and delusions. H/O overdose and inpatient at [facility]. Again admitted in 4 months due to noncompliant with medication. Abilify worked well butcould not afford. No history of physical sexual verbal and emotional abuse.   Recent Results (from the past 2160 hour(s))  POCT urinalysis dip (device)      Status: Abnormal   Collection Time: 05/08/19  1:05 PM  Result Value Ref Range   Glucose, UA NEGATIVE NEGATIVE mg/dL   Bilirubin Urine NEGATIVE NEGATIVE   Ketones, ur NEGATIVE NEGATIVE mg/dL   Specific Gravity, Urine 1.025 1.005 - 1.030   Hgb urine dipstick NEGATIVE NEGATIVE   pH 6.0 5.0 - 8.0   Protein, ur 30 (A) NEGATIVE mg/dL   Urobilinogen, UA 0.2 0.0 - 1.0 mg/dL   Nitrite NEGATIVE NEGATIVE   Leukocytes,Ua TRACE (A) NEGATIVE    Comment: Biochemical Testing Only. Please order routine urinalysis from main lab if confirmatory testing is needed.   Recent Results (from the past 2160 hour(s))  POCT urinalysis dip (device)     Status: Abnormal   Collection Time: 05/08/19  1:05 PM  Result Value Ref Range   Glucose, UA NEGATIVE NEGATIVE mg/dL   Bilirubin Urine NEGATIVE NEGATIVE   Ketones, ur NEGATIVE NEGATIVE mg/dL   Specific Gravity, Urine 1.025 1.005 - 1.030   Hgb urine dipstick NEGATIVE NEGATIVE   pH 6.0 5.0 - 8.0   Protein, ur 30 (A) NEGATIVE mg/dL   Urobilinogen, UA 0.2 0.0 - 1.0 mg/dL   Nitrite NEGATIVE NEGATIVE   Leukocytes,Ua TRACE (A) NEGATIVE    Comment: Biochemical Testing Only. Please order routine urinalysis from main lab if confirmatory testing is needed.    Psychiatric Specialty Exam: Physical Exam  ROS  Last menstrual period 02/02/2017.There is no height or weight on file  to calculate BMI.  General Appearance: NA  Eye Contact:  NA  Speech:  Clear and Coherent and Slow  Volume:  Normal  Mood:  Euthymic  Affect:  NA  Thought Process:  Goal Directed  Orientation:  Full (Time, Place, and Person)  Thought Content:  WDL and Logical  Suicidal Thoughts:  No  Homicidal Thoughts:  No  Memory:  Immediate;   Good Recent;   Good Remote;   Good  Judgement:  Good  Insight:  Good  Psychomotor Activity:  NA  Concentration:  Concentration: Fair and Attention Span: Fair  Recall:  [place] of Knowledge:  Good  Language:  Good  Akathisia:  No  Handed:  Right  AIMS  (if indicated):     Assets:  Communication Skills Desire for Improvement Housing Resilience Social Support Talents/Skills  ADL's:  Intact  Cognition:  WNL  Sleep:   fair      Assessment and Plan: Schizophrenia chronic paranoid type.  Patient is doing better on her low-dose Haldol.  I reviewed her recent blood work results.  Her hemoglobin A1c is 6.5.  She is having abdominal surgery for her adrenal gland.  I encourage to continue to explore CPAP mask so she can had a better sleep.  Discussed medication side effects and benefits.  She is not interested in therapy.  Recommended to call us back if she has any question or any concern.  Follow-up in 6 months.  Follow Up Instructions:    I discussed the assessment and treatment plan with the patient. The patient was provided an opportunity to ask questions and all were answered. The patient agreed with the plan and demonstrated an understanding of the instructions.   The patient was advised to call back or seek an in-person evaluation if the symptoms worsen or if the condition fails to improve as anticipated.  I provided 20 minutes of non-face-to-face time during this encounter.   Kathlee Nations, MD

## 2019-07-04 ENCOUNTER — Telehealth: Payer: Self-pay

## 2019-07-04 NOTE — Telephone Encounter (Signed)
Patient would like to restart dupixent.  Please Advise.

## 2019-07-04 NOTE — Telephone Encounter (Signed)
Would you like to schedule an ov with her to discuss or just let Maria Franklin know?

## 2019-07-04 NOTE — Telephone Encounter (Signed)
Good plan - let's do a televisit sometime! :-)   Salvatore Marvel, MD [facility] [place] [facility]

## 2019-07-04 NOTE — Telephone Encounter (Signed)
LVM for pt to return call

## 2019-07-04 NOTE — Telephone Encounter (Signed)
TELEMED VISIT SCHEDULED FOR TOMORROW

## 2019-07-05 ENCOUNTER — Telehealth: Payer: Self-pay | Admitting: *Deleted

## 2019-07-05 ENCOUNTER — Ambulatory Visit (INDEPENDENT_AMBULATORY_CARE_PROVIDER_SITE_OTHER): Payer: Medicare HMO | Admitting: Allergy & Immunology

## 2019-07-05 ENCOUNTER — Encounter: Payer: Self-pay | Admitting: Allergy & Immunology

## 2019-07-05 ENCOUNTER — Other Ambulatory Visit: Payer: Self-pay

## 2019-07-05 DIAGNOSIS — J4540 Moderate persistent asthma, uncomplicated: Secondary | ICD-10-CM

## 2019-07-05 DIAGNOSIS — J339 Nasal polyp, unspecified: Secondary | ICD-10-CM

## 2019-07-05 DIAGNOSIS — J302 Other seasonal allergic rhinitis: Secondary | ICD-10-CM | POA: Diagnosis not present

## 2019-07-05 DIAGNOSIS — E039 Hypothyroidism, unspecified: Secondary | ICD-10-CM | POA: Diagnosis not present

## 2019-07-05 DIAGNOSIS — J3089 Other allergic rhinitis: Secondary | ICD-10-CM | POA: Diagnosis not present

## 2019-07-05 DIAGNOSIS — Z8639 Personal history of other endocrine, nutritional and metabolic disease: Secondary | ICD-10-CM | POA: Diagnosis not present

## 2019-07-05 DIAGNOSIS — E2601 Conn's syndrome: Secondary | ICD-10-CM | POA: Diagnosis not present

## 2019-07-05 DIAGNOSIS — R7309 Other abnormal glucose: Secondary | ICD-10-CM | POA: Diagnosis not present

## 2019-07-05 MED ORDER — FLUTICASONE PROPIONATE 50 MCG/ACT NA SUSP: 2.0000 | Freq: Two times a day (BID) | NASAL | 5 refills | Status: DC

## 2019-07-05 MED ORDER — EPINEPHRINE 0.3 MG/0.3ML IJ SOAJ: 0.3000 mg | Freq: Once | INTRAMUSCULAR | 0 refills | Status: AC

## 2019-07-05 NOTE — Progress Notes (Signed)
RE: Maria Franklin MRN: [id number] DOB: March 30, 1961 Date of Telemedicine Visit: 07/05/2019  Referring provider: Jearld Fenton, NP Primary care provider: Jearld Fenton, NP  Chief Complaint: Asthma (wants to restart dupixent)   Telemedicine Follow Up Visit via Telephone: I connected with Maria Franklin for a follow up on 07/05/19 by telephone and verified that I am speaking with the correct person using two identifiers.   I discussed the limitations, risks, security and privacy concerns of performing an evaluation and management service by telephone and the availability of in person appointments. I also discussed with the patient that there may be a patient responsible charge related to this service. The patient expressed understanding and agreed to proceed.  Patient is at home.  Provider is at the office.  Visit start time: 8:33 AM Visit end time: 8:50 AM Insurance consent/check in by: [employer] consent and medical assistant/nurse: Maria Franklin  History of Present Illness:  She is a 58 y.o. female, who is being followed for moderate persistent asthma and nasal polyposis. Her previous allergy office visit was in April 2020 with myself. At her last visit, she felt that she was having reactions to [place].  These were rather vague and consisted of cold-like and flulike symptoms.  We attempted to make multiple changes to make her asthma regimen more economical for her.  We changed her from Symbicort to Flovent since I felt this might be cheaper.  Unfortunately, it was the same price as the Symbicort.  Regardless, we recommended 2 puffs twice daily with albuterol as needed.  We did try to get her approved for cheaper drug through [place] [facility], but apparently this did not help at all.  For her rhinitis, we continued Flonase and Astelin as well as Allegra and Singulair.  She has been on allergy shots in the past, but was inconsistent with coming in for those.  Her nasal polyposis was stable and  she did not feel that the [place] had helped with that at all.  In the interim, she decided she wanted to give Dupixent another try, which is why she presents today.  She was having a lot of issues with the [place]. She reports that she was having flu like symptoms with it. But she have had the flu at the time. This was back in March 2020. She did have a fever, although she did not take her temperature at all. She did have bad chills. She did feel that it helped her asthma when she was taking it. Therefore she is interested in restarting the [place] today.   Asthma/Respiratory Symptom History: She remains on the Symbicort two puffs twice daily but she is not using it like she should due to the cost. She did try the [place] [facility] [place] and she was not approved for that. She is only using one puff once daily. She has not needed any prednisone.  She has not needed any hospitalizations.  Allergic Rhinitis Symptom History: She really is only using the Flonase.  She tells me she could not afford the Astelin.  She occasionally takes an antihistamine.  She has not interested in restarting allergy shots.  Nasal Polyposis Symptom History: She had a bilateral endoscopic sinus surgery with nasal polypectomy and bilateral inferior turbinate reduction performed in January 2020.  Her ENT is Dr. Wilburn Franklin.  She last saw him in February 2020.  She tells me that her last appointment was canceled due to [place].  In retrospect, she tells me that  the Dupixent did seem to help with improving her nasal congestion while she was on it.  Otherwise, there have been no changes to her past medical history, surgical history, family history, or social history.  Assessment and Plan:  Maria Franklin is a 58 y.o. female with:  Seasonal and perennial allergic rhinitis (grasses, weeds, trees, indoor and outdoor molds, dust mite, roach)  Moderate persistent asthma without complication - benefit with Dupixent  Nasal polyposis- s/p three sinus  surgeries (1990s, 2013, 2020)  Difficulty affording medications    1. Seasonal and perennial allergic rhinitis - Continue with the Flonase two sprays per nostril daily.  - Continue with Allegra one tablet daily (can take an extra if you have a particularly bad day).  - Continue with Singulair (montelukast) 10mg  daily.   2. Moderate persistent asthma - not well controlled - We will go ahead and get the Dupixent sent to you again. - Daily controller medication(s): Symbicort 160/4.54mcg two puffs twice daily with spacer - Prior to physical activity: albuterol 2 puffs 10-15 minutes before physical activity. - Rescue medications: albuterol 4 puffs every 4-6 hours as needed or albuterol nebulizer one vial every 4-6 hours as needed - Asthma control goals:  * Full participation in all desired activities (may need albuterol before activity) * Albuterol use two time or less a week on average (not counting use with activity) * Cough interfering with sleep two time or less a month * Oral steroids no more than once a year * No hospitalizations  3. Nasal polyposis - Adding the Dupixent back on will help with the nasal polyps.  - Continue to follow with ENT.   4. Return in about 3 months (around 10/05/2019). This can be an in-person, a virtual Webex or a telephone follow up visit.   Diagnostics: None.  Medication List:  Current Outpatient Medications  Medication Sig Dispense Refill   albuterol (PROVENTIL) (2.5 MG/3ML) 0.083% nebulizer solution Take 3 mLs (2.5 mg total) by nebulization every 4 (four) hours as needed for wheezing or shortness of breath. 75 mL 11   albuterol (VENTOLIN HFA) 108 (90 Base) MCG/ACT inhaler INHALE ONE TO TWO PUFFS INTO LUNGS EVERY 6 HOURS AS NEEDED FOR WHEEZING OR SHORTNESS OF BREATH 18 each 1   amLODipine (NORVASC) 10 MG tablet Take 1 tablet (10 mg total) by mouth daily. MUST SCHEDULE PHYSICAL EXAM 90 tablet 0   atorvastatin (LIPITOR) 10 MG tablet TAKE 1 TABLET  DAILY AT 6PM 90 tablet 0   budesonide-formoterol (SYMBICORT) 160-4.5 MCG/ACT inhaler Inhale 2 puffs into the lungs 2 (two) times daily. 1 Inhaler 5   carvedilol (COREG) 12.5 MG tablet Take 1 tablet (12.5 mg total) by mouth 2 (two) times daily with a meal. MUST SCHEDULE ANNUAL PHYSICAL 180 tablet 0   EPINEPHrine 0.3 mg/0.3 mL IJ SOAJ injection Inject 0.3 mg into the muscle once.      ferrous sulfate 325 (65 FE) MG tablet Take 1 tablet (325 mg total) by mouth daily with breakfast. 30 tablet 2   haloperidol (HALDOL) 1 MG tablet Take 1 tablet (1 mg total) by mouth at bedtime. 90 tablet 1   hydrALAZINE (APRESOLINE) 10 MG tablet Take 1 tablet (10 mg total) by mouth 2 (two) times daily. MUST SCHEDULE ANNUAL PHYSICAL 180 tablet 0   levothyroxine (SYNTHROID, LEVOTHROID) 50 MCG tablet Take 1 tablet (50 mcg total) by mouth daily before breakfast. MUST SCHEDULE ANNUAL PHYSICAL 90 tablet 0   lisinopril (PRINIVIL,ZESTRIL) 40 MG tablet Take 1 tablet (40 mg  total) by mouth daily. MUST SCHEDULE PHYSICAL EXAM 90 tablet 0   montelukast (SINGULAIR) 10 MG tablet Take 1 tablet (10 mg total) by mouth daily. MUST SCHEDULE PHYSICAL EXAM 90 tablet 0   Omega-3 Fatty Acids (FISH OIL PO) Take 1 capsule by mouth daily.     omeprazole (PRILOSEC) 40 MG capsule TAKE 1 CAPSULE DAILY 90 capsule 0   metFORMIN (GLUCOPHAGE) 500 MG tablet      No current facility-administered medications for this visit.    Allergies: Allergies  Allergen Reactions   Bee Pollen Cough   Pollen Extract Cough   Latex Itching, Rash and Other (See Comments)    Severe itching   I reviewed her past medical history, social history, family history, and environmental history and no significant changes have been reported from previous visits.  Review of Systems  Constitutional: Negative for activity change, appetite change, chills and fever.  HENT: Positive for congestion, postnasal drip and rhinorrhea. Negative for sinus pressure and sore  throat.   Eyes: Negative for pain, discharge, redness and itching.  Respiratory: Positive for shortness of breath. Negative for wheezing and stridor.   Gastrointestinal: Negative for diarrhea, nausea and vomiting.  Endocrine: Negative for cold intolerance and heat intolerance.  Musculoskeletal: Negative for arthralgias, joint swelling and myalgias.  Skin: Negative for rash.  Allergic/Immunologic: Negative for environmental allergies and food allergies.    Objective:  Physical exam not obtained as encounter was done via telephone.   Previous notes and tests were reviewed.  I discussed the assessment and treatment plan with the patient. The patient was provided an opportunity to ask questions and all were answered. The patient agreed with the plan and demonstrated an understanding of the instructions.   The patient was advised to call back or seek an in-person evaluation if the symptoms worsen or if the condition fails to improve as anticipated.  I provided 17 minutes of non-face-to-face time during this encounter.  It was my pleasure to participate in [place] care today. Please feel free to contact me with any questions or concerns.   Sincerely,  Valentina Shaggy, MD

## 2019-07-05 NOTE — Telephone Encounter (Signed)
Per Dr Ernst Bowler patient wants to restart her [place].  I L/M for patient to contact me so I can advise her to reach out to [facility] to set that up.  Their number is [phone]

## 2019-07-05 NOTE — Patient Instructions (Addendum)
1. Seasonal and perennial allergic rhinitis - Continue with the Flonase two sprays per nostril daily.  - Continue with Allegra one tablet daily (can take an extra if you have a particularly bad day).  - Continue with Singulair (montelukast) 10mg  daily.   2. Moderate persistent asthma - not well controlled - We will go ahead and get the Dupixent sent to you again. - Daily controller medication(s): Symbicort 160/4.41mcg two puffs twice daily with spacer - Prior to physical activity: albuterol 2 puffs 10-15 minutes before physical activity. - Rescue medications: albuterol 4 puffs every 4-6 hours as needed or albuterol nebulizer one vial every 4-6 hours as needed - Asthma control goals:  * Full participation in all desired activities (may need albuterol before activity) * Albuterol use two time or less a week on average (not counting use with activity) * Cough interfering with sleep two time or less a month * Oral steroids no more than once a year * No hospitalizations  3. Nasal polyposis - Adding the Dupixent back on will help with the nasal polyps.  - Continue to follow with ENT.   4. Return in about 3 months (around 10/05/2019). This can be an in-person, a virtual Webex or a telephone follow up visit.   Please inform us of any Emergency Department visits, hospitalizations, or changes in symptoms. Call us before going to the ED for breathing or allergy symptoms since we might be able to fit you in for a sick visit. Feel free to contact us anytime with any questions, problems, or concerns.  It was a pleasure to talk to you today today!  Websites that have reliable patient information: 1. American Academy of Asthma, Allergy, and Immunology: www.aaaai.org 2. Food Allergy Research and Education (FARE): foodallergy.org 3. Mothers of Asthmatics: http://www.asthmacommunitynetwork.org 4. American College of Allergy, Asthma, and Immunology: www.acaai.org  "Like" [place] on Facebook and Instagram for  our latest updates!      Make sure you are registered to vote! If you have moved or changed any of your contact information, you will need to get this updated before voting!  In some cases, you MAY be able to register to vote online: [email or web address]    Voter ID laws are NOT going into effect for the General Election in November 2020! DO NOT let this stop you from exercising your right to vote!   Absentee voting is the SAFEST way to vote during the coronavirus pandemic!   Download and print an absentee ballot request form at [email or web address] or you can scan the QR code below with your smart phone:      More information on absentee ballots can be found here: [email or web address]

## 2019-07-05 NOTE — Addendum Note (Signed)
Addended by: Neomia Dear on: 07/05/2019 10:29 AM   Modules accepted: Orders

## 2019-07-06 DIAGNOSIS — J4540 Moderate persistent asthma, uncomplicated: Secondary | ICD-10-CM | POA: Insufficient documentation

## 2019-07-06 DIAGNOSIS — Z1159 Encounter for screening for other viral diseases: Secondary | ICD-10-CM | POA: Diagnosis not present

## 2019-07-06 DIAGNOSIS — E269 Hyperaldosteronism, unspecified: Secondary | ICD-10-CM | POA: Diagnosis not present

## 2019-07-06 DIAGNOSIS — Z6841 Body Mass Index (BMI) 40.0 and over, adult: Secondary | ICD-10-CM | POA: Diagnosis not present

## 2019-07-06 DIAGNOSIS — Z20828 Contact with and (suspected) exposure to other viral communicable diseases: Secondary | ICD-10-CM | POA: Diagnosis not present

## 2019-07-06 DIAGNOSIS — D3502 Benign neoplasm of left adrenal gland: Secondary | ICD-10-CM | POA: Diagnosis not present

## 2019-07-06 DIAGNOSIS — E119 Type 2 diabetes mellitus without complications: Secondary | ICD-10-CM | POA: Diagnosis not present

## 2019-07-06 DIAGNOSIS — Z01812 Encounter for preprocedural laboratory examination: Secondary | ICD-10-CM | POA: Diagnosis not present

## 2019-07-06 NOTE — Telephone Encounter (Signed)
I spoke to patient and then contacted [facility] and advised patient that nurse will reach out to set up delivery or she can contact them

## 2019-07-07 DIAGNOSIS — R9431 Abnormal electrocardiogram [ECG] [EKG]: Secondary | ICD-10-CM | POA: Diagnosis not present

## 2019-07-07 HISTORY — PX: OTHER SURGICAL HISTORY: SHX169

## 2019-07-10 ENCOUNTER — Encounter: Payer: Self-pay | Admitting: Internal Medicine

## 2019-07-11 DIAGNOSIS — R7309 Other abnormal glucose: Secondary | ICD-10-CM | POA: Diagnosis not present

## 2019-07-11 DIAGNOSIS — D3502 Benign neoplasm of left adrenal gland: Secondary | ICD-10-CM | POA: Diagnosis not present

## 2019-07-11 DIAGNOSIS — D4412 Neoplasm of uncertain behavior of left adrenal gland: Secondary | ICD-10-CM | POA: Diagnosis not present

## 2019-07-11 DIAGNOSIS — E119 Type 2 diabetes mellitus without complications: Secondary | ICD-10-CM | POA: Diagnosis not present

## 2019-07-11 DIAGNOSIS — E269 Hyperaldosteronism, unspecified: Secondary | ICD-10-CM | POA: Diagnosis not present

## 2019-07-11 DIAGNOSIS — E2689 Other hyperaldosteronism: Secondary | ICD-10-CM | POA: Diagnosis not present

## 2019-07-11 DIAGNOSIS — Z8639 Personal history of other endocrine, nutritional and metabolic disease: Secondary | ICD-10-CM | POA: Diagnosis not present

## 2019-07-11 DIAGNOSIS — E039 Hypothyroidism, unspecified: Secondary | ICD-10-CM | POA: Diagnosis not present

## 2019-07-11 DIAGNOSIS — Z6841 Body Mass Index (BMI) 40.0 and over, adult: Secondary | ICD-10-CM | POA: Diagnosis not present

## 2019-07-11 DIAGNOSIS — I10 Essential (primary) hypertension: Secondary | ICD-10-CM | POA: Diagnosis not present

## 2019-07-11 DIAGNOSIS — E2601 Conn's syndrome: Secondary | ICD-10-CM | POA: Diagnosis not present

## 2019-07-12 DIAGNOSIS — E039 Hypothyroidism, unspecified: Secondary | ICD-10-CM | POA: Diagnosis not present

## 2019-07-12 DIAGNOSIS — E2601 Conn's syndrome: Secondary | ICD-10-CM | POA: Diagnosis not present

## 2019-07-12 DIAGNOSIS — Z8639 Personal history of other endocrine, nutritional and metabolic disease: Secondary | ICD-10-CM | POA: Diagnosis not present

## 2019-07-12 DIAGNOSIS — Z6841 Body Mass Index (BMI) 40.0 and over, adult: Secondary | ICD-10-CM | POA: Diagnosis not present

## 2019-07-12 DIAGNOSIS — R7309 Other abnormal glucose: Secondary | ICD-10-CM | POA: Diagnosis not present

## 2019-07-12 DIAGNOSIS — I10 Essential (primary) hypertension: Secondary | ICD-10-CM | POA: Diagnosis not present

## 2019-07-12 MED ORDER — MORPHINE SULFATE 4 MG/ML IJ SOLN
1.00 | INTRAMUSCULAR | Status: DC
Start: ? — End: 2019-07-12

## 2019-07-12 MED ORDER — PANTOPRAZOLE SODIUM 40 MG PO TBEC
40.00 | DELAYED_RELEASE_TABLET | ORAL | Status: DC
Start: 2019-07-13 — End: 2019-07-12

## 2019-07-12 MED ORDER — OXYCODONE-ACETAMINOPHEN 5-325 MG PO TABS
2.00 | ORAL_TABLET | ORAL | Status: DC
Start: ? — End: 2019-07-12

## 2019-07-12 MED ORDER — OXYCODONE-ACETAMINOPHEN 5-325 MG PO TABS
1.00 | ORAL_TABLET | ORAL | Status: DC
Start: ? — End: 2019-07-12

## 2019-07-12 MED ORDER — ONDANSETRON HCL 4 MG/2ML IJ SOLN
4.00 | INTRAMUSCULAR | Status: DC
Start: ? — End: 2019-07-12

## 2019-07-12 MED ORDER — DIPHENHYDRAMINE HCL 25 MG PO CAPS
25.00 | ORAL_CAPSULE | ORAL | Status: DC
Start: ? — End: 2019-07-12

## 2019-07-12 MED ORDER — GLUCOSE 40 % PO GEL
15.00 | ORAL | Status: DC
Start: ? — End: 2019-07-12

## 2019-07-12 MED ORDER — ATORVASTATIN CALCIUM 10 MG PO TABS
10.00 | ORAL_TABLET | ORAL | Status: DC
Start: 2019-07-12 — End: 2019-07-12

## 2019-07-12 MED ORDER — HYDROMORPHONE HCL 2 MG/ML IJ SOLN
.50 | INTRAMUSCULAR | Status: DC
Start: ? — End: 2019-07-12

## 2019-07-12 MED ORDER — CARVEDILOL 12.5 MG PO TABS
12.50 | ORAL_TABLET | ORAL | Status: DC
Start: 2019-07-12 — End: 2019-07-12

## 2019-07-12 MED ORDER — BUDESONIDE-FORMOTEROL FUMARATE 160-4.5 MCG/ACT IN AERO
2.00 | INHALATION_SPRAY | RESPIRATORY_TRACT | Status: DC
Start: 2019-07-12 — End: 2019-07-12

## 2019-07-12 MED ORDER — LEVOTHYROXINE SODIUM 25 MCG PO TABS
50.00 | ORAL_TABLET | ORAL | Status: DC
Start: 2019-07-13 — End: 2019-07-12

## 2019-07-12 MED ORDER — FLUTICASONE PROPIONATE 50 MCG/ACT NA SUSP
2.00 | NASAL | Status: DC
Start: 2019-07-13 — End: 2019-07-12

## 2019-07-12 MED ORDER — INSULIN LISPRO 100 UNIT/ML ~~LOC~~ SOLN
2.00 | SUBCUTANEOUS | Status: DC
Start: 2019-07-12 — End: 2019-07-12

## 2019-07-12 MED ORDER — ENOXAPARIN SODIUM 40 MG/0.4ML ~~LOC~~ SOLN
40.00 | SUBCUTANEOUS | Status: DC
Start: 2019-07-13 — End: 2019-07-12

## 2019-07-12 MED ORDER — DEXTROSE 10 % IV SOLN
125.00 | INTRAVENOUS | Status: DC
Start: ? — End: 2019-07-12

## 2019-07-12 MED ORDER — ALBUTEROL SULFATE (2.5 MG/3ML) 0.083% IN NEBU
2.50 | INHALATION_SOLUTION | RESPIRATORY_TRACT | Status: DC
Start: ? — End: 2019-07-12

## 2019-07-15 DIAGNOSIS — J4521 Mild intermittent asthma with (acute) exacerbation: Secondary | ICD-10-CM | POA: Diagnosis not present

## 2019-07-20 ENCOUNTER — Other Ambulatory Visit: Payer: Self-pay

## 2019-07-20 DIAGNOSIS — E269 Hyperaldosteronism, unspecified: Secondary | ICD-10-CM | POA: Diagnosis not present

## 2019-07-20 DIAGNOSIS — Z8639 Personal history of other endocrine, nutritional and metabolic disease: Secondary | ICD-10-CM | POA: Diagnosis not present

## 2019-07-20 DIAGNOSIS — Z4889 Encounter for other specified surgical aftercare: Secondary | ICD-10-CM | POA: Diagnosis not present

## 2019-07-20 MED ORDER — LISINOPRIL 40 MG PO TABS: 40.0000 mg | ORAL_TABLET | Freq: Every day | ORAL | 0 refills | Status: DC

## 2019-07-23 ENCOUNTER — Other Ambulatory Visit: Payer: Self-pay | Admitting: Internal Medicine

## 2019-07-25 ENCOUNTER — Other Ambulatory Visit: Payer: Self-pay

## 2019-07-25 ENCOUNTER — Ambulatory Visit (INDEPENDENT_AMBULATORY_CARE_PROVIDER_SITE_OTHER): Payer: Medicare HMO | Admitting: Internal Medicine

## 2019-07-25 ENCOUNTER — Encounter: Payer: Self-pay | Admitting: Internal Medicine

## 2019-07-25 VITALS — BP 144/84 | HR 72 | Temp 97.7°F | Ht 62.5 in | Wt 250.0 lb

## 2019-07-25 DIAGNOSIS — Z124 Encounter for screening for malignant neoplasm of cervix: Secondary | ICD-10-CM | POA: Diagnosis not present

## 2019-07-25 DIAGNOSIS — K219 Gastro-esophageal reflux disease without esophagitis: Secondary | ICD-10-CM | POA: Diagnosis not present

## 2019-07-25 DIAGNOSIS — G4733 Obstructive sleep apnea (adult) (pediatric): Secondary | ICD-10-CM

## 2019-07-25 DIAGNOSIS — I10 Essential (primary) hypertension: Secondary | ICD-10-CM | POA: Diagnosis not present

## 2019-07-25 DIAGNOSIS — E119 Type 2 diabetes mellitus without complications: Secondary | ICD-10-CM | POA: Diagnosis not present

## 2019-07-25 DIAGNOSIS — K122 Cellulitis and abscess of mouth: Secondary | ICD-10-CM

## 2019-07-25 DIAGNOSIS — R232 Flushing: Secondary | ICD-10-CM | POA: Diagnosis not present

## 2019-07-25 DIAGNOSIS — E039 Hypothyroidism, unspecified: Secondary | ICD-10-CM | POA: Diagnosis not present

## 2019-07-25 DIAGNOSIS — F200 Paranoid schizophrenia: Secondary | ICD-10-CM

## 2019-07-25 DIAGNOSIS — J4530 Mild persistent asthma, uncomplicated: Secondary | ICD-10-CM

## 2019-07-25 DIAGNOSIS — Z0000 Encounter for general adult medical examination without abnormal findings: Secondary | ICD-10-CM

## 2019-07-25 DIAGNOSIS — R69 Illness, unspecified: Secondary | ICD-10-CM | POA: Diagnosis not present

## 2019-07-25 DIAGNOSIS — E782 Mixed hyperlipidemia: Secondary | ICD-10-CM

## 2019-07-25 DIAGNOSIS — Z113 Encounter for screening for infections with a predominantly sexual mode of transmission: Secondary | ICD-10-CM | POA: Diagnosis not present

## 2019-07-25 MED ORDER — LISINOPRIL 20 MG PO TABS: 20.0000 mg | ORAL_TABLET | Freq: Every day | ORAL | 3 refills | Status: DC

## 2019-07-25 NOTE — Assessment & Plan Note (Signed)
CBC and CMET today Continue Omeprazole for now Discussed how avoiding eating late at night and weight loss could help reduce reflux Will monitor

## 2019-07-25 NOTE — Assessment & Plan Note (Signed)
TSH and Free T4 today Will adjust Levothyroxine if needed based on labs Continue to follow with endocrinology

## 2019-07-25 NOTE — Assessment & Plan Note (Signed)
No angina Monitor

## 2019-07-25 NOTE — Assessment & Plan Note (Signed)
Continue current inhalers She will continue to follow with pulmonology

## 2019-07-25 NOTE — Assessment & Plan Note (Signed)
Stable on Haldol She will continue to follow with psych

## 2019-07-25 NOTE — Patient Instructions (Signed)

## 2019-07-25 NOTE — Progress Notes (Signed)
HPI:  Pt presents to the clinic today for her subsequent annual Medicare Wellness Exam. She is also due to follow up chronic conditions.  Hypothyroidism: She denies any issues on her current dose of Levothyroxine. She follows with endocrinology.  DM 2: Her last A1C was 6.5, 03/2019. She does not routinely check her sugars. She is taking Metformin as prescribed. She checks her feet daily. Her last eye exam was 07/2018. She is following with endocrinology.   OSA: She averages 7 hours of sleep per night. She does not wear her CPAP. Sleep study from 06/2016 reviewed.  HTN: Her BP today is 144/84. She is taking Carvedilol as prescribed. ECG from 03/2019 reviewed.  HLD: Her last LDL was 71, 03/2019. She denies myalgias on Atorvastatin. She is taking Fish Oil OTC. She does not consume a low fat diet.  GERD: Triggered by eating late and laying down. She denies breakthrough on Omeprazole. There is no upper GI on file.  Asthma: She reports chronic SOB. She is taking Singulair, Symbicort and Albuterol as prescribed. PFT's from 12/2018 reviewed. She follows with pulmonology.  Schizophrenia: Chronic, stable on Haldol. She denies hallucinations or paranoia. She denies SI/HI. She follows with psychiatry.   Past Medical History:  Diagnosis Date  . Allergy   . Anemia   . Angio-edema   . Asthma    ALL THE TIME   LAST FLARE UP 12/2018  . Chicken pox   . Diabetes mellitus without complication ([place])   . GERD (gastroesophageal reflux disease)   . Hyperlipidemia   . Hypertension   . Hyperthyroidism   . Recurrent upper respiratory infection (URI)   . Schizophrenia ([place])   . Sleep apnea    DX 2-3 YRS AGO.  Marland Kitchen Thyroid disease   . Urticaria     Current Outpatient Medications  Medication Sig Dispense Refill  . albuterol (PROVENTIL) (2.5 MG/3ML) 0.083% nebulizer solution Take 3 mLs (2.5 mg total) by nebulization every 4 (four) hours as needed for wheezing or shortness of breath. 75 mL 11  . albuterol  (VENTOLIN HFA) 108 (90 Base) MCG/ACT inhaler INHALE ONE TO TWO PUFFS INTO LUNGS EVERY 6 HOURS AS NEEDED FOR WHEEZING OR SHORTNESS OF BREATH 18 each 1  . amLODipine (NORVASC) 10 MG tablet Take 1 tablet (10 mg total) by mouth daily. MUST SCHEDULE PHYSICAL EXAM 90 tablet 0  . atorvastatin (LIPITOR) 10 MG tablet Take 1 tablet (10 mg total) by mouth daily at 6 PM. 90 tablet 0  . budesonide-formoterol (SYMBICORT) 160-4.5 MCG/ACT inhaler Inhale 2 puffs into the lungs 2 (two) times daily. 1 Inhaler 5  . carvedilol (COREG) 12.5 MG tablet Take 1 tablet (12.5 mg total) by mouth 2 (two) times daily with a meal. MUST SCHEDULE ANNUAL PHYSICAL 180 tablet 0  . ferrous sulfate 325 (65 FE) MG tablet Take 1 tablet (325 mg total) by mouth daily with breakfast. 30 tablet 2  . fluticasone (FLONASE) 50 MCG/ACT nasal spray Place 2 sprays into both nostrils 2 (two) times a day. 16 g 5  . haloperidol (HALDOL) 1 MG tablet Take 1 tablet (1 mg total) by mouth at bedtime. 90 tablet 1  . hydrALAZINE (APRESOLINE) 10 MG tablet Take 1 tablet (10 mg total) by mouth 2 (two) times daily. MUST SCHEDULE ANNUAL PHYSICAL 180 tablet 0  . levothyroxine (SYNTHROID, LEVOTHROID) 50 MCG tablet Take 1 tablet (50 mcg total) by mouth daily before breakfast. MUST SCHEDULE ANNUAL PHYSICAL 90 tablet 0  . lisinopril (ZESTRIL) 40 MG tablet Take  1 tablet (40 mg total) by mouth daily. 90 tablet 0  . metFORMIN (GLUCOPHAGE) 500 MG tablet     . montelukast (SINGULAIR) 10 MG tablet Take 1 tablet (10 mg total) by mouth daily. MUST SCHEDULE PHYSICAL EXAM 90 tablet 0  . Omega-3 Fatty Acids (FISH OIL PO) Take 1 capsule by mouth daily.    Marland Kitchen omeprazole (PRILOSEC) 40 MG capsule TAKE 1 CAPSULE DAILY 90 capsule 0   No current facility-administered medications for this visit.     Allergies  Allergen Reactions  . Bee Pollen Cough  . Pollen Extract Cough  . Latex Itching, Rash and Other (See Comments)    Severe itching    Family History  Problem Relation Age  of Onset  . Mental illness Mother   . Hypertension Mother   . Schizophrenia Mother   . Depression Mother   . Cancer Father        LUNG  . Cancer Sister        BREAST  . Breast cancer Sister   . Mental illness Brother   . Hypertension Brother   . Schizophrenia Brother   . Depression Brother   . Heart disease Maternal Grandmother   . Depression Maternal Grandmother   . Schizophrenia Maternal Grandmother   . Schizophrenia Brother   . Schizophrenia Maternal Uncle   . Breast cancer Maternal Aunt     Social History   Socioeconomic History  . Marital status: Married    Spouse name: Not on file  . Number of children: Not on file  . Years of education: Not on file  . Highest education level: Not on file  Occupational History  . Not on file  Social Needs  . Financial resource strain: Not on file  . Food insecurity    Worry: Not on file    Inability: Not on file  . Transportation needs    Medical: Not on file    Non-medical: Not on file  Tobacco Use  . Smoking status: Former Smoker    Packs/day: 0.25    Years: 6.00    Pack years: 1.50  . Smokeless tobacco: Never Used  . Tobacco comment: QUIT IN HER EARLY 20'S  Substance and Sexual Activity  . Alcohol use: No    Alcohol/week: 0.0 standard drinks  . Drug use: No  . Sexual activity: Not Currently  Lifestyle  . Physical activity    Days per week: Not on file    Minutes per session: Not on file  . Stress: Not on file  Relationships  . Social Herbalist on phone: Not on file    Gets together: Not on file    Attends religious service: Not on file    Active member of club or organization: Not on file    Attends meetings of clubs or organizations: Not on file    Relationship status: Not on file  . Intimate partner violence    Fear of current or ex partner: Not on file    Emotionally abused: Not on file    Physically abused: Not on file    Forced sexual activity: Not on file  Other Topics Concern  . 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 Scarlette Slice  Psychiatrist: Dr. Adele Schilder  ENT: Dr. Georgina Pillion  Allergist: Dr. Rosalita Chessman  Pulmonologist: Dr. Alva Garnet  Neurologist: Dr. Delice Lesch   I have personally reviewed and have noted:  1. The patient's medical and social history 2. Their use of alcohol, tobacco or illicit drugs 3. Their current medications and supplements 4. The patient's functional ability including ADL's, fall risks, home safety risks and hearing or visual impairment. 5. Diet and physical activities 6. Evidence for depression or mood disorder  Subjective:   Review of Systems:   Constitutional: Denies fever, malaise, fatigue, headache or abrupt weight changes.  HEENT: Denies eye pain, eye redness, ear pain, ringing in the ears, wax buildup, runny nose, nasal congestion, bloody nose, or sore throat. Respiratory: Pt reports shortness of breath. Denies difficulty breathing, cough or sputum production.   Cardiovascular: Denies chest pain, chest tightness, palpitations or swelling in the hands or feet.  Gastrointestinal: Denies abdominal pain, bloating, constipation, diarrhea or blood in the stool.  GU: Denies urgency, frequency, pain with urination, burning sensation, blood in urine, odor or discharge. Musculoskeletal: Denies decrease in range of motion, difficulty with gait, muscle pain or joint pain and swelling.  Skin: Denies redness, rashes, lesions or ulcercations.  Neurological: Pt reports hot flashes.  Denies dizziness, difficulty with memory, difficulty with speech or problems with balance and coordination.  Psych: Denies anxiety, depression, SI/HI.  No other specific complaints in a complete review of systems (except as listed in HPI above).  Objective:  PE:   BP (!) 144/84   Pulse 72   Temp 97.7 F (36.5 C) (Temporal)   Ht 5' 2.5" (1.588 m)   Wt 250 lb (113.4 kg)   LMP 02/02/2017 (Approximate)   SpO2 99%   BMI 45.00 kg/m   Wt Readings from Last 3 Encounters:  02/07/19 249 lb (112.9 kg)  01/05/19 247 lb 11.2 oz (112.4 kg)  12/28/18 247 lb 11.2 oz (112.4 kg)    General: Appears her stated age, obese, in NAD. Skin: Warm, dry and intact. No lower extremity ulcerations noted. HEENT: Head: normal shape and size; Eyes: sclera white, no icterus, conjunctiva pink, PERRLA and EOMs intact; Ears: Tm's gray and intact, normal light reflex;  Neck: Neck supple, trachea midline. No masses, lumps present.  Cardiovascular: Normal rate and rhythm. S1,S2 noted.  No murmur, rubs or gallops noted. No JVD or BLE edema. No carotid bruits noted. Pulmonary/Chest: Normal effort and positive vesicular breath sounds. No respiratory distress. No wheezes, rales or ronchi noted.  Abdomen: Soft and nontender. Normal bowel sounds. No distention or masses noted. Liver, spleen and kidneys non palpable. Pelvic: Normal female anatomy. Thick clear/white discharge noted, no odor. No CMT. Adnexa non palpable. Musculoskeletal: . Strength 5/5 BUE/BLE. No signs of joint swelling.  Neurological: Alert and oriented. Cranial nerves II-XII grossly intact. Coordination normal.  Psychiatric: Mood and affect normal. Behavior is normal. Judgment and thought content normal.     BMET    Component Value Date/Time   NA 138 03/21/2019 1449   K 3.1 (L) 03/21/2019 1449   CL 101 03/21/2019 1449   CO2 30 03/21/2019 1449   GLUCOSE 85 03/21/2019 1449   BUN 17 03/21/2019 1449   CREATININE 0.98 03/21/2019  1449   CALCIUM 9.0  03/21/2019 1449   GFRNONAA >60 12/28/2018 1043   GFRAA >60 12/28/2018 1043    Lipid Panel     Component Value Date/Time   CHOL 161 03/21/2019 1449   TRIG 109.0 03/21/2019 1449   HDL 67.80 03/21/2019 1449   CHOLHDL 2 03/21/2019 1449   VLDL 21.8 03/21/2019 1449   LDLCALC 71 03/21/2019 1449    CBC    Component Value Date/Time   WBC 7.1 03/21/2019 1449   RBC 4.01 03/21/2019 1449   HGB 10.6 (L) 03/21/2019 1449   HCT 32.9 (L) 03/21/2019 1449   PLT 285.0 03/21/2019 1449   MCV 82.1 03/21/2019 1449   MCH 26.3 12/28/2018 1043   MCHC 32.3 03/21/2019 1449   RDW 15.0 03/21/2019 1449   LYMPHSABS 1.7 01/14/2014 1224   MONOABS 0.3 01/14/2014 1224   EOSABS 0.1 01/14/2014 1224   BASOSABS 0.0 01/14/2014 1224    Hgb A1C Lab Results  Component Value Date   HGBA1C 6.5 03/21/2019      Assessment and Plan:   Medicare Annual Wellness Visit:  Diet: She does eat meat. She consumes fruits and veggies daily. She does eat some fried foods. She drinks mostly water, grapefruit juice, coffee and hot tea. Physical activity: None since recent surgery Depression/mood screen: Negative, PHQ 9 score of 0 Hearing: Intact to whispered voice Visual acuity: Grossly normal, performs annual eye exam  ADLs: Capable Fall risk: None Home safety: Good Cognitive evaluation: Intact to orientation, naming, recall and repetition EOL planning: No adv directives, full code/ I agree  Preventative Medicine: Encouraged her to get a flu shot in the fall. Tetanus, pneumovax UTD. Mammogram due 10/2019. Pap smear today with STD screening. Colon screening UTD. Encouraged her to consume a balanced diet and exercise regimen. Advised her to see an eye doctor and dentist annually. Will check CBC, CMET, Lipid, TSH, Free T4, Lipid, A1C, urine microalbumin and Vit D today. Due dates for screening exam given to patient as part of her AVS.  Hot Flashes:  Known menopausal She wants FSH/LH checked for confirmation   Next  appointment:RTC in 6 months to follow up chronic conditions.   Webb Silversmith, NP

## 2019-07-25 NOTE — Assessment & Plan Note (Signed)
A1C and urine microalbumin today Encouraged her to consume a low carb diet and exercise for weight loss Continue Metformin- CMET today Encouraged yearly eye exam, due now Foot exam today Encouraged her to get a flu shot in the fall Pneumovax UTD

## 2019-07-25 NOTE — Assessment & Plan Note (Signed)
Due for repeat sleep study, will discuss this with her pulmonologist Encouraged exercise for weight loss as this may help improve OSA symptoms

## 2019-07-25 NOTE — Assessment & Plan Note (Signed)
Stop Carvedilol Start Lisinopril 20 mg daily CMET today Reinforced DASH diet and exercise for weight loss  RTC in 2 weeks for BP check

## 2019-07-25 NOTE — Assessment & Plan Note (Signed)
CMET and Lipid profile today Continue Atorvastatin and Fish Oil Encouraged her to consume a low fat diet

## 2019-07-26 ENCOUNTER — Other Ambulatory Visit (HOSPITAL_COMMUNITY)
Admission: RE | Admit: 2019-07-26 | Discharge: 2019-07-26 | Disposition: A | Discharge status: Home / Self Care | Payer: Medicare HMO | Source: Ambulatory Visit | Attending: Internal Medicine | Admitting: Internal Medicine

## 2019-07-26 DIAGNOSIS — Z113 Encounter for screening for infections with a predominantly sexual mode of transmission: Secondary | ICD-10-CM | POA: Insufficient documentation

## 2019-07-26 DIAGNOSIS — Z780 Asymptomatic menopausal state: Secondary | ICD-10-CM | POA: Insufficient documentation

## 2019-07-26 DIAGNOSIS — R69 Illness, unspecified: Secondary | ICD-10-CM | POA: Diagnosis not present

## 2019-07-26 DIAGNOSIS — Z1151 Encounter for screening for human papillomavirus (HPV): Secondary | ICD-10-CM | POA: Insufficient documentation

## 2019-07-26 DIAGNOSIS — Z124 Encounter for screening for malignant neoplasm of cervix: Secondary | ICD-10-CM | POA: Diagnosis not present

## 2019-07-26 LAB — COMPREHENSIVE METABOLIC PANEL
ALT: 17 U/L (ref 0–35)
AST: 17 U/L (ref 0–37)
Albumin: 4.4 g/dL (ref 3.5–5.2)
Alkaline Phosphatase: 103 U/L (ref 39–117)
BUN: 19 mg/dL (ref 6–23)
CO2: 26 mEq/L (ref 19–32)
Calcium: 9.9 mg/dL (ref 8.4–10.5)
Chloride: 100 mEq/L (ref 96–112)
Creatinine, Ser: 1.38 mg/dL — ABNORMAL HIGH (ref 0.40–1.20)
GFR: 47.49 mL/min — ABNORMAL LOW (ref >60.00)
Glucose, Bld: 96 mg/dL (ref 70–99)
Potassium: 4.6 mEq/L (ref 3.5–5.1)
Sodium: 139 mEq/L (ref 135–145)
Total Bilirubin: 0.3 mg/dL (ref 0.2–1.2)
Total Protein: 7.5 g/dL (ref 6.0–8.3)

## 2019-07-26 LAB — CBC
HCT: 33 % — ABNORMAL LOW (ref 36.0–46.0)
Hemoglobin: 10.6 g/dL — ABNORMAL LOW (ref 12.0–15.0)
MCHC: 32.1 g/dL (ref 30.0–36.0)
MCV: 82.2 fl (ref 78.0–100.0)
Platelets: 351 10*3/uL (ref 150.0–400.0)
RBC: 4.02 Mil/uL (ref 3.87–5.11)
RDW: 14.3 % (ref 11.5–15.5)
WBC: 9 10*3/uL (ref 4.0–10.5)

## 2019-07-26 LAB — LIPID PANEL
Cholesterol: 197 mg/dL (ref 0–200)
HDL: 68.2 mg/dL (ref >39.00)
LDL Cholesterol: 106 mg/dL — ABNORMAL HIGH (ref 0–99)
NonHDL: 128.34
Total CHOL/HDL Ratio: 3
Triglycerides: 112 mg/dL (ref 0.0–149.0)
VLDL: 22.4 mg/dL (ref 0.0–40.0)

## 2019-07-26 LAB — HEMOGLOBIN A1C: Hgb A1c MFr Bld: 6.7 % — ABNORMAL HIGH (ref 4.6–6.5)

## 2019-07-26 LAB — T4, FREE: Free T4: 1.03 ng/dL (ref 0.60–1.60)

## 2019-07-26 LAB — FOLLICLE STIMULATING HORMONE: FSH: 51.9 m[IU]/mL

## 2019-07-26 LAB — MICROALBUMIN / CREATININE URINE RATIO
Creatinine,U: 153.6 mg/dL
Microalb Creat Ratio: 2.7 mg/g (ref 0.0–30.0)
Microalb, Ur: 4.2 mg/dL — ABNORMAL HIGH (ref 0.0–1.9)

## 2019-07-26 LAB — TSH: TSH: 2.14 u[IU]/mL (ref 0.35–4.50)

## 2019-07-26 LAB — VITAMIN D 25 HYDROXY (VIT D DEFICIENCY, FRACTURES): VITD: 28.95 ng/mL — ABNORMAL LOW (ref 30.00–100.00)

## 2019-07-26 LAB — LUTEINIZING HORMONE: LH: 39.96 m[IU]/mL

## 2019-07-26 NOTE — Addendum Note (Signed)
Addended by: Lurlean Nanny on: 07/26/2019 02:27 PM   Modules accepted: Orders

## 2019-07-28 ENCOUNTER — Encounter: Payer: Self-pay | Admitting: Internal Medicine

## 2019-07-30 LAB — CYTOLOGY - PAP
Chlamydia: NEGATIVE
Diagnosis: NEGATIVE
HPV: NOT DETECTED
Neisseria Gonorrhea: NEGATIVE
Trichomonas: NEGATIVE

## 2019-08-07 DIAGNOSIS — R079 Chest pain, unspecified: Secondary | ICD-10-CM

## 2019-08-07 HISTORY — DX: Chest pain, unspecified: R07.9

## 2019-08-08 ENCOUNTER — Encounter: Payer: Self-pay | Admitting: Internal Medicine

## 2019-08-15 DIAGNOSIS — J4521 Mild intermittent asthma with (acute) exacerbation: Secondary | ICD-10-CM | POA: Diagnosis not present

## 2019-08-20 DIAGNOSIS — E039 Hypothyroidism, unspecified: Secondary | ICD-10-CM | POA: Diagnosis not present

## 2019-08-20 DIAGNOSIS — R7309 Other abnormal glucose: Secondary | ICD-10-CM | POA: Diagnosis not present

## 2019-08-20 DIAGNOSIS — I10 Essential (primary) hypertension: Secondary | ICD-10-CM | POA: Diagnosis not present

## 2019-08-21 ENCOUNTER — Other Ambulatory Visit: Payer: Self-pay

## 2019-08-21 ENCOUNTER — Emergency Department (HOSPITAL_COMMUNITY)
Admission: EM | Admit: 2019-08-21 | Discharge: 2019-08-21 | Disposition: A | Discharge status: Home / Self Care | Payer: Medicare HMO | Attending: Emergency Medicine | Admitting: Emergency Medicine

## 2019-08-21 ENCOUNTER — Encounter (HOSPITAL_COMMUNITY): Payer: Self-pay

## 2019-08-21 DIAGNOSIS — M546 Pain in thoracic spine: Secondary | ICD-10-CM | POA: Diagnosis present

## 2019-08-21 DIAGNOSIS — Z5321 Procedure and treatment not carried out due to patient leaving prior to being seen by health care provider: Secondary | ICD-10-CM | POA: Insufficient documentation

## 2019-08-21 NOTE — ED Triage Notes (Addendum)
Pt arrived POV with c/o upper back pain for 2 weeks unresolved with heat ice and tylenol. Pt states 8/10 pain. Denies falling or heavy lifting. VSS Afebrile. Pt strongly reports latex/adhesive allergy/irratation

## 2019-08-21 NOTE — ED Notes (Signed)
Called for patient X3. No answer.

## 2019-08-23 ENCOUNTER — Ambulatory Visit: Payer: Medicare HMO | Admitting: Internal Medicine

## 2019-08-23 DIAGNOSIS — R7309 Other abnormal glucose: Secondary | ICD-10-CM | POA: Diagnosis not present

## 2019-08-23 DIAGNOSIS — I10 Essential (primary) hypertension: Secondary | ICD-10-CM | POA: Diagnosis not present

## 2019-08-23 DIAGNOSIS — Z8639 Personal history of other endocrine, nutritional and metabolic disease: Secondary | ICD-10-CM | POA: Diagnosis not present

## 2019-08-23 DIAGNOSIS — E039 Hypothyroidism, unspecified: Secondary | ICD-10-CM | POA: Diagnosis not present

## 2019-08-23 DIAGNOSIS — E2601 Conn's syndrome: Secondary | ICD-10-CM | POA: Diagnosis not present

## 2019-08-23 DIAGNOSIS — E875 Hyperkalemia: Secondary | ICD-10-CM | POA: Diagnosis not present

## 2019-08-23 DIAGNOSIS — Z6841 Body Mass Index (BMI) 40.0 and over, adult: Secondary | ICD-10-CM | POA: Diagnosis not present

## 2019-08-27 ENCOUNTER — Ambulatory Visit: Payer: Medicare HMO | Admitting: Primary Care

## 2019-08-27 ENCOUNTER — Ambulatory Visit: Payer: Medicare HMO | Admitting: Internal Medicine

## 2019-08-28 ENCOUNTER — Inpatient Hospital Stay (HOSPITAL_COMMUNITY)
Admission: AD | Admit: 2019-08-28 | Discharge: 2019-08-31 | DRG: 305 | Disposition: A | Discharge status: Home / Self Care | Payer: Medicare HMO | Attending: Internal Medicine | Admitting: Internal Medicine

## 2019-08-28 ENCOUNTER — Other Ambulatory Visit: Payer: Self-pay

## 2019-08-28 ENCOUNTER — Ambulatory Visit: Payer: Medicare HMO | Admitting: Family Medicine

## 2019-08-28 ENCOUNTER — Ambulatory Visit (INDEPENDENT_AMBULATORY_CARE_PROVIDER_SITE_OTHER)
Admission: EM | Admit: 2019-08-28 | Discharge: 2019-08-28 | Disposition: A | Discharge status: Home / Self Care | Payer: Medicare HMO | Source: Home / Self Care

## 2019-08-28 ENCOUNTER — Emergency Department (HOSPITAL_COMMUNITY): Payer: Medicare HMO

## 2019-08-28 ENCOUNTER — Encounter (HOSPITAL_COMMUNITY): Payer: Self-pay | Admitting: Emergency Medicine

## 2019-08-28 ENCOUNTER — Encounter (HOSPITAL_COMMUNITY): Payer: Self-pay

## 2019-08-28 DIAGNOSIS — N1831 Chronic kidney disease, stage 3a: Secondary | ICD-10-CM

## 2019-08-28 DIAGNOSIS — Z7984 Long term (current) use of oral hypoglycemic drugs: Secondary | ICD-10-CM | POA: Diagnosis not present

## 2019-08-28 DIAGNOSIS — R0789 Other chest pain: Secondary | ICD-10-CM | POA: Diagnosis present

## 2019-08-28 DIAGNOSIS — E039 Hypothyroidism, unspecified: Secondary | ICD-10-CM | POA: Diagnosis not present

## 2019-08-28 DIAGNOSIS — I160 Hypertensive urgency: Secondary | ICD-10-CM

## 2019-08-28 DIAGNOSIS — Z803 Family history of malignant neoplasm of breast: Secondary | ICD-10-CM | POA: Diagnosis not present

## 2019-08-28 DIAGNOSIS — D638 Anemia in other chronic diseases classified elsewhere: Secondary | ICD-10-CM | POA: Diagnosis present

## 2019-08-28 DIAGNOSIS — R072 Precordial pain: Secondary | ICD-10-CM | POA: Diagnosis not present

## 2019-08-28 DIAGNOSIS — F200 Paranoid schizophrenia: Secondary | ICD-10-CM | POA: Diagnosis present

## 2019-08-28 DIAGNOSIS — R079 Chest pain, unspecified: Secondary | ICD-10-CM | POA: Diagnosis not present

## 2019-08-28 DIAGNOSIS — Z79890 Hormone replacement therapy (postmenopausal): Secondary | ICD-10-CM

## 2019-08-28 DIAGNOSIS — I129 Hypertensive chronic kidney disease with stage 1 through stage 4 chronic kidney disease, or unspecified chronic kidney disease: Secondary | ICD-10-CM | POA: Diagnosis not present

## 2019-08-28 DIAGNOSIS — I10 Essential (primary) hypertension: Secondary | ICD-10-CM | POA: Diagnosis not present

## 2019-08-28 DIAGNOSIS — Z6841 Body Mass Index (BMI) 40.0 and over, adult: Secondary | ICD-10-CM

## 2019-08-28 DIAGNOSIS — Z79899 Other long term (current) drug therapy: Secondary | ICD-10-CM | POA: Diagnosis not present

## 2019-08-28 DIAGNOSIS — R51 Headache: Secondary | ICD-10-CM | POA: Diagnosis present

## 2019-08-28 DIAGNOSIS — E785 Hyperlipidemia, unspecified: Secondary | ICD-10-CM | POA: Diagnosis present

## 2019-08-28 DIAGNOSIS — Z8249 Family history of ischemic heart disease and other diseases of the circulatory system: Secondary | ICD-10-CM | POA: Diagnosis not present

## 2019-08-28 DIAGNOSIS — K219 Gastro-esophageal reflux disease without esophagitis: Secondary | ICD-10-CM | POA: Diagnosis present

## 2019-08-28 DIAGNOSIS — M549 Dorsalgia, unspecified: Secondary | ICD-10-CM | POA: Diagnosis present

## 2019-08-28 DIAGNOSIS — Z91040 Latex allergy status: Secondary | ICD-10-CM

## 2019-08-28 DIAGNOSIS — R7989 Other specified abnormal findings of blood chemistry: Secondary | ICD-10-CM | POA: Diagnosis not present

## 2019-08-28 DIAGNOSIS — I161 Hypertensive emergency: Principal | ICD-10-CM | POA: Diagnosis present

## 2019-08-28 DIAGNOSIS — E119 Type 2 diabetes mellitus without complications: Secondary | ICD-10-CM | POA: Diagnosis not present

## 2019-08-28 DIAGNOSIS — R0602 Shortness of breath: Secondary | ICD-10-CM

## 2019-08-28 DIAGNOSIS — N183 Chronic kidney disease, stage 3 unspecified: Secondary | ICD-10-CM | POA: Diagnosis present

## 2019-08-28 DIAGNOSIS — J4530 Mild persistent asthma, uncomplicated: Secondary | ICD-10-CM | POA: Diagnosis not present

## 2019-08-28 DIAGNOSIS — Z87891 Personal history of nicotine dependence: Secondary | ICD-10-CM

## 2019-08-28 DIAGNOSIS — R69 Illness, unspecified: Secondary | ICD-10-CM | POA: Diagnosis not present

## 2019-08-28 DIAGNOSIS — Z7951 Long term (current) use of inhaled steroids: Secondary | ICD-10-CM

## 2019-08-28 DIAGNOSIS — J9811 Atelectasis: Secondary | ICD-10-CM | POA: Diagnosis present

## 2019-08-28 DIAGNOSIS — G4733 Obstructive sleep apnea (adult) (pediatric): Secondary | ICD-10-CM | POA: Diagnosis present

## 2019-08-28 DIAGNOSIS — Z20828 Contact with and (suspected) exposure to other viral communicable diseases: Secondary | ICD-10-CM | POA: Diagnosis present

## 2019-08-28 DIAGNOSIS — E1122 Type 2 diabetes mellitus with diabetic chronic kidney disease: Secondary | ICD-10-CM | POA: Diagnosis not present

## 2019-08-28 LAB — CBC
HCT: 34.8 % — ABNORMAL LOW (ref 36.0–46.0)
Hemoglobin: 11 g/dL — ABNORMAL LOW (ref 12.0–15.0)
MCH: 27.4 pg (ref 26.0–34.0)
MCHC: 31.6 g/dL (ref 30.0–36.0)
MCV: 86.8 fL (ref 80.0–100.0)
Platelets: 238 10*3/uL (ref 150–400)
RBC: 4.01 MIL/uL (ref 3.87–5.11)
RDW: 14.5 % (ref 11.5–15.5)
WBC: 8.2 10*3/uL (ref 4.0–10.5)
nRBC: 0 % (ref 0.0–0.2)

## 2019-08-28 LAB — BASIC METABOLIC PANEL
Anion gap: 6 (ref 5–15)
BUN: 17 mg/dL (ref 6–20)
CO2: 26 mmol/L (ref 22–32)
Calcium: 9.2 mg/dL (ref 8.9–10.3)
Chloride: 106 mmol/L (ref 98–111)
Creatinine, Ser: 1.37 mg/dL — ABNORMAL HIGH (ref 0.44–1.00)
GFR calc Af Amer: 49 mL/min — ABNORMAL LOW (ref >60)
GFR calc non Af Amer: 42 mL/min — ABNORMAL LOW (ref >60)
Glucose, Bld: 102 mg/dL — ABNORMAL HIGH (ref 70–99)
Potassium: 4.6 mmol/L (ref 3.5–5.1)
Sodium: 138 mmol/L (ref 135–145)

## 2019-08-28 LAB — I-STAT BETA HCG BLOOD, ED (MC, WL, AP ONLY): I-stat hCG, quantitative: <5 m[IU]/mL (ref <5)

## 2019-08-28 LAB — TROPONIN I (HIGH SENSITIVITY): Troponin I (High Sensitivity): 22 ng/L — ABNORMAL HIGH (ref <18)

## 2019-08-28 MED ORDER — SODIUM CHLORIDE 0.9% FLUSH
3.0000 mL | Freq: Once | INTRAVENOUS | Status: AC
  Administered 2019-08-29: 01:00:00 3 mL via INTRAVENOUS

## 2019-08-28 NOTE — ED Provider Notes (Signed)
MRN: [id number] DOB: 1960/12/25  Subjective:   Maria Franklin is a 58 y.o. female presenting for 1 day history of acute onset intermittent mid to upper left-sided chest pain that radiates to her back.  She also has a 2-week history of persistent shortness of breath, malaise associated with her back pain.  She has been using her albuterol treatments every day with good relief.  She contacted a nurse at her PCP practice and was advised to go to the [facility] ER.  However, patient showed up here.  Patient was also in the emergency room on 08/21/2019 but left before being seen.  Patient's medical history significant for hypertension, diabetes type 2, OSA, hyperlipidemia, Ludwig angina.  No current facility-administered medications for this encounter.   Current Outpatient Medications:  .  albuterol (PROVENTIL) (2.5 MG/3ML) 0.083% nebulizer solution, Take 3 mLs (2.5 mg total) by nebulization every 4 (four) hours as needed for wheezing or shortness of breath., Disp: 75 mL, Rfl: 11 .  albuterol (VENTOLIN HFA) 108 (90 Base) MCG/ACT inhaler, INHALE ONE TO TWO PUFFS INTO LUNGS EVERY 6 HOURS AS NEEDED FOR WHEEZING OR SHORTNESS OF BREATH, Disp: 18 each, Rfl: 1 .  atorvastatin (LIPITOR) 10 MG tablet, Take 1 tablet (10 mg total) by mouth daily at 6 PM., Disp: 90 tablet, Rfl: 0 .  budesonide-formoterol (SYMBICORT) 160-4.5 MCG/ACT inhaler, Inhale 2 puffs into the lungs 2 (two) times daily., Disp: 1 Inhaler, Rfl: 5 .  dupilumab (DUPIXENT) 200 MG/1.14ML prefilled syringe, Inject 200 mg into the skin every 14 (fourteen) days., Disp: , Rfl:  .  ferrous sulfate 325 (65 FE) MG tablet, Take 1 tablet (325 mg total) by mouth daily with breakfast., Disp: 30 tablet, Rfl: 2 .  fluticasone (FLONASE) 50 MCG/ACT nasal spray, Place 2 sprays into both nostrils 2 (two) times a day., Disp: 16 g, Rfl: 5 .  haloperidol (HALDOL) 1 MG tablet, Take 1 tablet (1 mg total) by mouth at bedtime., Disp: 90 tablet, Rfl: 1 .  levothyroxine (SYNTHROID,  LEVOTHROID) 50 MCG tablet, Take 1 tablet (50 mcg total) by mouth daily before breakfast. MUST SCHEDULE ANNUAL PHYSICAL, Disp: 90 tablet, Rfl: 0 .  lisinopril (ZESTRIL) 20 MG tablet, Take 1 tablet (20 mg total) by mouth daily. (Patient taking differently: Take 10 mg by mouth daily. ), Disp: 90 tablet, Rfl: 3 .  metFORMIN (GLUCOPHAGE) 500 MG tablet, Take 500 mg by mouth 2 (two) times daily with a meal. , Disp: , Rfl:  .  montelukast (SINGULAIR) 10 MG tablet, Take 1 tablet (10 mg total) by mouth daily. MUST SCHEDULE PHYSICAL EXAM, Disp: 90 tablet, Rfl: 0 .  Omega-3 Fatty Acids (FISH OIL PO), Take 1 capsule by mouth daily., Disp: , Rfl:  .  omeprazole (PRILOSEC) 40 MG capsule, TAKE 1 CAPSULE DAILY, Disp: 90 capsule, Rfl: 0   Allergies  Allergen Reactions  . Bee Pollen Cough  . Pollen Extract Cough  . Latex Itching, Rash and Other (See Comments)    Severe itching    Past Medical History:  Diagnosis Date  . Allergy   . Anemia   . Angio-edema   . Asthma    ALL THE TIME   LAST FLARE UP 12/2018  . Chicken pox   . Diabetes mellitus without complication ([place])   . GERD (gastroesophageal reflux disease)   . Hyperlipidemia   . Hypertension   . Hyperthyroidism   . Recurrent upper respiratory infection (URI)   . Schizophrenia ([place])   . Sleep apnea  DX 2-3 YRS AGO.  Marland Kitchen Thyroid disease   . Urticaria      Past Surgical History:  Procedure Laterality Date  . SINOSCOPY    . SINUS ENDO W/FUSION Bilateral 01/05/2019   Procedure: ENDOSCOPIC SINUS SURGERY WITH NAVIGATION;  Surgeon: Jerrell Belfast, MD;  Location: [place];  Service: ENT;  Laterality: Bilateral;  . SINUS IRRIGATION    . TONSILLECTOMY    . TURBINATE REDUCTION N/A 01/05/2019   Procedure: Turbinate Reduction;  Surgeon: Jerrell Belfast, MD;  Location: [place];  Service: ENT;  Laterality: N/A;    ROS  Objective:   Vitals: BP (!) 172/124 (BP Location: Right Arm)   Pulse 75   Temp 98 F (36.7 C) (Temporal)   Resp 16   LMP  02/02/2017 (Approximate)   Pulse oximetry was 98% on recheck.  Physical Exam Constitutional:      General: She is not in acute distress.    Appearance: Normal appearance. She is well-developed. She is obese. She is not ill-appearing, toxic-appearing or diaphoretic.  HENT:     Head: Normocephalic and atraumatic.     Nose: Nose normal.     Mouth/Throat:     Mouth: Mucous membranes are moist.     Pharynx: Oropharynx is clear.  Eyes:     General: No scleral icterus.    Extraocular Movements: Extraocular movements intact.     Pupils: Pupils are equal, round, and reactive to light.  Cardiovascular:     Rate and Rhythm: Normal rate and regular rhythm.     Pulses: Normal pulses.     Heart sounds: Normal heart sounds. No murmur. No friction rub. No gallop.   Pulmonary:     Effort: Pulmonary effort is normal. No respiratory distress.     Breath sounds: Normal breath sounds. No stridor. No wheezing, rhonchi or rales.  Skin:    General: Skin is warm and dry.     Findings: No rash.  Neurological:     General: No focal deficit present.     Mental Status: She is alert and oriented to person, place, and time.     Cranial Nerves: No cranial nerve deficit.  Psychiatric:        Mood and Affect: Mood normal.        Behavior: Behavior normal.        Thought Content: Thought content normal.     Assessment and Plan :   1. Hypertensive urgency   2. Essential hypertension   3. Elevated blood pressure reading with diagnosis of hypertension   4. Atypical chest pain   5. Acute back pain, unspecified back location, unspecified back pain laterality     Patient needs emergent work-up for rule out of ACS, aortic dissection in the setting of hypertensive urgency given her blood pressure reading and difficult to control blood pressure.  Patient states that her PCP has tried to manage this, has had an adrenal gland procedure.  She also has a history of Ludwig angina.  Counseled patient that we cannot  adequately evaluate her in the urgent care for ACS, aortic dissection or other acute cardiopulmonary event in the setting of multiple cardiovascular risk factors.  Patient refused transport by her nursing staff, states that she will had there on her own now.   Jaynee Eagles, [place] 08/28/19 1941

## 2019-08-28 NOTE — ED Triage Notes (Signed)
Patient reports having back pain and shortness of breath for 2 weeks, she is using  Albuterol inhaler and nebulizer treatments every day, without improvement of her symptoms.

## 2019-08-28 NOTE — ED Triage Notes (Signed)
Pt reports back pain and sob x2 weeks, states that 3-4 days ago she began having chest pain as well. States she has a hx of asthma and has been using her singulair, rescue inhaler and breathing txs without resolution. Pt a/ox4, resp mildly labored while talking.

## 2019-08-28 NOTE — ED Provider Notes (Signed)
[facility] EMERGENCY DEPARTMENT Provider Note   CSN: [id number] Arrival date & time: 08/28/19  1942     History   Chief Complaint Chief Complaint  Patient presents with  . Chest Pain  . Back Pain    HPI Maria Franklin is a 58 y.o. female.     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 She states this has felt different from her asthma because she has not been wheezing. She has taken her regular medications for asthma, including Singulair, Albuterol inhaler and nebulizers, which are not providing any relief. About 3 days ago, while at rest, she developed central chest tightness, "like a pressure". This discomfort has remained constant but does wax and wane in intensity. It does not radiate. She denies experiencing this in the past. She can't say what makes it better or worse. She reports her blood pressure medications have been changed recently, starting with the adrenal surgery where the stopped all medications and she ultimately ended up take carvedilol daily, and hydralazine twice daily for pressures that reach or exceed 150/90. Tonight was the first time she had to take the hydralazine.  She went to her Urgent Care prior to arrival and was sent here for further evaluation.   The history is provided by the patient. No language interpreter was used.  Chest Pain Associated symptoms: back pain (Chronic) and shortness of breath   Associated symptoms: no abdominal pain, no cough, no fever and no nausea   Back Pain Associated symptoms: chest pain   Associated symptoms: no abdominal pain and no fever     Past Medical History:  Diagnosis Date  . Allergy   . Anemia   . Angio-edema   . Asthma    ALL THE TIME   LAST FLARE UP 12/2018  . Chicken pox   . Diabetes mellitus without complication ([place])   . GERD  (gastroesophageal reflux disease)   . Hyperlipidemia   . Hypertension   . Hyperthyroidism   . Recurrent upper respiratory infection (URI)   . Schizophrenia ([place])   . Sleep apnea    DX 2-3 YRS AGO.  Marland Kitchen Thyroid disease   . Urticaria     Patient Active Problem List   Diagnosis Date Noted  . OSA (obstructive sleep apnea) 04/29/2016  . GERD (gastroesophageal reflux disease) 10/28/2015  . Asthma, mild persistent 04/21/2015  . Paranoid schizophrenia, chronic condition ([place]) 04/19/2014  . Seasonal allergies 04/19/2014  . DM2 (diabetes mellitus, type 2) ([place]) 04/19/2014  . Angina, [place] 12/30/2013  . Hypertension 01/10/2013  . Hyperlipidemia 01/10/2013  . Hypothyroidism 01/10/2013    Past Surgical History:  Procedure Laterality Date  . SINOSCOPY    . SINUS ENDO W/FUSION Bilateral 01/05/2019   Procedure: ENDOSCOPIC SINUS SURGERY WITH NAVIGATION;  Surgeon: Jerrell Belfast, MD;  Location: No Name;  Service: ENT;  Laterality: Bilateral;  . SINUS IRRIGATION    . TONSILLECTOMY    . TURBINATE REDUCTION N/A 01/05/2019   Procedure: Turbinate Reduction;  Surgeon: Jerrell Belfast, MD;  Location: [facility];  Service: ENT;  Laterality: N/A;     OB History   No obstetric history on file.      Home Medications    Prior to Admission medications   Medication Sig Start Date End Date Taking? Authorizing Provider  albuterol (PROVENTIL) (2.5 MG/3ML) 0.083% nebulizer solution Take  3 mLs (2.5 mg total) by nebulization every 4 (four) hours as needed for wheezing or shortness of breath. 12/14/18   Valentina Shaggy, MD  albuterol (VENTOLIN HFA) 108 (90 Base) MCG/ACT inhaler INHALE ONE TO TWO PUFFS INTO LUNGS EVERY 6 HOURS AS NEEDED FOR WHEEZING OR SHORTNESS OF BREATH 02/07/19   Jearld Fenton, NP  atorvastatin (LIPITOR) 10 MG tablet Take 1 tablet (10 mg total) by mouth daily at 6 PM. 07/24/19   Baity, Coralie Keens, NP  budesonide-formoterol (SYMBICORT) 160-4.5 MCG/ACT inhaler Inhale 2 puffs into the lungs 2  (two) times daily. 12/14/18   Valentina Shaggy, MD  dupilumab (DUPIXENT) 200 MG/1.14ML prefilled syringe Inject 200 mg into the skin every 14 (fourteen) days.    [provider]  ferrous sulfate 325 (65 FE) MG tablet Take 1 tablet (325 mg total) by mouth daily with breakfast. 03/27/19   Jearld Fenton, NP  fluticasone (FLONASE) 50 MCG/ACT nasal spray Place 2 sprays into both nostrils 2 (two) times a day. 07/05/19   Valentina Shaggy, MD  haloperidol (HALDOL) 1 MG tablet Take 1 tablet (1 mg total) by mouth at bedtime. 06/27/19 06/26/20  Arfeen, Arlyce Harman, MD  levothyroxine (SYNTHROID, LEVOTHROID) 50 MCG tablet Take 1 tablet (50 mcg total) by mouth daily before breakfast. MUST SCHEDULE ANNUAL PHYSICAL 02/07/19   Jearld Fenton, NP  lisinopril (ZESTRIL) 20 MG tablet Take 1 tablet (20 mg total) by mouth daily. Patient taking differently: Take 10 mg by mouth daily.  07/25/19   Jearld Fenton, NP  metFORMIN (GLUCOPHAGE) 500 MG tablet Take 500 mg by mouth 2 (two) times daily with a meal.  06/26/19   [provider]  montelukast (SINGULAIR) 10 MG tablet Take 1 tablet (10 mg total) by mouth daily. MUST SCHEDULE PHYSICAL EXAM 02/07/19   Jearld Fenton, NP  Omega-3 Fatty Acids (FISH OIL PO) Take 1 capsule by mouth daily.    [provider]  omeprazole (PRILOSEC) 40 MG capsule TAKE 1 CAPSULE DAILY 02/12/19   Jearld Fenton, NP    Family History Family History  Problem Relation Age of Onset  . Mental illness Mother   . Hypertension Mother   . Schizophrenia Mother   . Depression Mother   . Cancer Father        LUNG  . Cancer Sister        BREAST  . Breast cancer Sister   . Mental illness Brother   . Hypertension Brother   . Schizophrenia Brother   . Depression Brother   . Heart disease Maternal Grandmother   . Depression Maternal Grandmother   . Schizophrenia Maternal Grandmother   . Schizophrenia Brother   . Schizophrenia Maternal Uncle   . Breast cancer Maternal Aunt      Social History Social History   Tobacco Use  . Smoking status: Former Smoker    Packs/day: 0.25    Years: 6.00    Pack years: 1.50  . Smokeless tobacco: Never Used  . Tobacco comment: QUIT IN HER EARLY 20'S  Substance Use Topics  . Alcohol use: No    Alcohol/week: 0.0 standard drinks  . Drug use: No     Allergies   Bee pollen, Pollen extract, and Latex   Review of Systems Review of Systems  Constitutional: Negative for chills and fever.  HENT: Negative.   Respiratory: Positive for chest tightness and shortness of breath. Negative for cough.   Cardiovascular: Positive for chest pain. Negative for  leg swelling.  Gastrointestinal: Negative.  Negative for abdominal pain, diarrhea and nausea.  Musculoskeletal: Positive for back pain (Chronic).  Skin: Negative.   Neurological: Negative.      Physical Exam Updated Vital Signs BP (!) 162/75 (BP Location: Left Wrist)   Pulse 77   Temp 98.4 F (36.9 C) (Oral)   Resp 18   LMP 02/02/2017 (Approximate)   SpO2 99%   Physical Exam Vitals signs and nursing note reviewed.  Constitutional:      Appearance: She is well-developed.  HENT:     Head: Normocephalic.  Neck:     Musculoskeletal: Normal range of motion and neck supple.     Vascular: No carotid bruit.  Cardiovascular:     Rate and Rhythm: Normal rate and regular rhythm.     Heart sounds: No murmur.  Pulmonary:     Effort: Pulmonary effort is normal.     Breath sounds: Normal breath sounds. No wheezing, rhonchi or rales.  Chest:     Chest wall: No tenderness.  Abdominal:     General: Bowel sounds are normal.     Palpations: Abdomen is soft.     Tenderness: There is no abdominal tenderness. There is no guarding or rebound.  Musculoskeletal: Normal range of motion.     Right lower leg: No edema.     Left lower leg: No edema.  Skin:    General: Skin is warm and dry.     Findings: No rash.  Neurological:     General: No focal deficit present.     Mental  Status: She is alert and oriented to person, place, and time.      ED Treatments / Results  Labs (all labs ordered are listed, but only abnormal results are displayed) Labs Reviewed  BASIC METABOLIC PANEL - Abnormal; Notable for the following components:      Result Value   Glucose, Bld 102 (*)    Creatinine, Ser 1.37 (*)    GFR calc non Af Amer 42 (*)    GFR calc Af Amer 49 (*)    All other components within normal limits  CBC - Abnormal; Notable for the following components:   Hemoglobin 11.0 (*)    HCT 34.8 (*)    All other components within normal limits  TROPONIN I (HIGH SENSITIVITY) - Abnormal; Notable for the following components:   Troponin I (High Sensitivity) 22 (*)    All other components within normal limits  I-STAT BETA HCG BLOOD, ED ([facility], [facility], [facility] ONLY)  TROPONIN I (HIGH SENSITIVITY)  TROPONIN I (HIGH SENSITIVITY)   Results for orders placed or performed during the hospital encounter of [id number]  Basic metabolic panel  Result Value Ref Range   Sodium 138 135 - 145 mmol/L   Potassium 4.6 3.5 - 5.1 mmol/L   Chloride 106 98 - 111 mmol/L   CO2 26 22 - 32 mmol/L   Glucose, Bld 102 (H) 70 - 99 mg/dL   BUN 17 6 - 20 mg/dL   Creatinine, Ser 1.37 (H) 0.44 - 1.00 mg/dL   Calcium 9.2 8.9 - 10.3 mg/dL   GFR calc non Af Amer 42 (L) >60 mL/min   GFR calc Af Amer 49 (L) >60 mL/min   Anion gap 6 5 - 15  CBC  Result Value Ref Range   WBC 8.2 4.0 - 10.5 K/uL   RBC 4.01 3.87 - 5.11 MIL/uL   Hemoglobin 11.0 (L) 12.0 - 15.0 g/dL   HCT 34.8 (L)  36.0 - 46.0 %   MCV 86.8 80.0 - 100.0 fL   MCH 27.4 26.0 - 34.0 pg   MCHC 31.6 30.0 - 36.0 g/dL   RDW 14.5 11.5 - 15.5 %   Platelets 238 150 - 400 K/uL   nRBC 0.0 0.0 - 0.2 %  I-Stat beta hCG blood, ED  Result Value Ref Range   I-stat hCG, quantitative <5.0 <5 mIU/mL   Comment 3          Troponin I (High Sensitivity)  Result Value Ref Range   Troponin I (High Sensitivity) 22 (H) <18 ng/L  Troponin I (High Sensitivity)  Result  Value Ref Range   Troponin I (High Sensitivity) 23 (H) <18 ng/L    EKG None  Radiology Dg Chest 2 View  Result Date: 08/28/2019 CLINICAL DATA:  Back pain, shortness of breath, chest pain EXAM: CHEST - 2 VIEW COMPARISON:  12/04/2018 FINDINGS: Linear subsegmental atelectasis at the right lung base. Left lung clear. No effusions. Heart is normal size. No acute bony abnormality. IMPRESSION: Right base atelectasis.  No active disease. Electronically Signed   By: Rolm Baptise M.D.   On: 08/28/2019 20:31    Procedures Procedures (including critical care time)  Medications Ordered in ED Medications  sodium chloride flush (NS) 0.9 % injection 3 mL (has no administration in time range)     Initial Impression / Assessment and Plan / ED Course  I have reviewed the triage vital signs and the nursing notes.  Pertinent labs & imaging results that were available during my care of the patient were reviewed by me and considered in my medical decision making (see chart for details).        Patient with medical history as per HPI presents with chest tightness x 3 days, SOB x 2 weeks. Sent from Urgent Care for evaluation of hypertensive urgency.   On arrival, she reports her chest tightness is improved. Blood pressure also improved to 154/85. EKG nonischemic.   She has a heart score of 4 with troponins of 23 and 22. Due to her risk factors, presentation and troponin levels, admission is indicated to further evaluate.  Final Clinical Impressions(s) / ED Diagnoses   Final diagnoses:  None   1. Chest pain, nonspecific 2. Dyspnea   ED Discharge Orders    None       Charlann Lange, Hershal Coria [phone]    Delora Fuel, MD [phone]

## 2019-08-29 ENCOUNTER — Observation Stay (HOSPITAL_COMMUNITY): Payer: Medicare HMO

## 2019-08-29 ENCOUNTER — Encounter (HOSPITAL_COMMUNITY): Payer: Self-pay | Admitting: Family Medicine

## 2019-08-29 DIAGNOSIS — J4530 Mild persistent asthma, uncomplicated: Secondary | ICD-10-CM | POA: Diagnosis not present

## 2019-08-29 DIAGNOSIS — R0602 Shortness of breath: Secondary | ICD-10-CM | POA: Diagnosis not present

## 2019-08-29 DIAGNOSIS — E119 Type 2 diabetes mellitus without complications: Secondary | ICD-10-CM

## 2019-08-29 DIAGNOSIS — R079 Chest pain, unspecified: Secondary | ICD-10-CM | POA: Diagnosis not present

## 2019-08-29 DIAGNOSIS — N183 Chronic kidney disease, stage 3 unspecified: Secondary | ICD-10-CM | POA: Diagnosis present

## 2019-08-29 DIAGNOSIS — R7989 Other specified abnormal findings of blood chemistry: Secondary | ICD-10-CM | POA: Diagnosis not present

## 2019-08-29 DIAGNOSIS — F200 Paranoid schizophrenia: Secondary | ICD-10-CM

## 2019-08-29 DIAGNOSIS — I160 Hypertensive urgency: Secondary | ICD-10-CM | POA: Diagnosis present

## 2019-08-29 DIAGNOSIS — R072 Precordial pain: Secondary | ICD-10-CM

## 2019-08-29 LAB — COMPREHENSIVE METABOLIC PANEL
ALT: 18 U/L (ref 0–44)
AST: 20 U/L (ref 15–41)
Albumin: 3.5 g/dL (ref 3.5–5.0)
Alkaline Phosphatase: 76 U/L (ref 38–126)
Anion gap: 8 (ref 5–15)
BUN: 18 mg/dL (ref 6–20)
CO2: 24 mmol/L (ref 22–32)
Calcium: 8.9 mg/dL (ref 8.9–10.3)
Chloride: 105 mmol/L (ref 98–111)
Creatinine, Ser: 1.23 mg/dL — ABNORMAL HIGH (ref 0.44–1.00)
GFR calc Af Amer: 56 mL/min — ABNORMAL LOW (ref >60)
GFR calc non Af Amer: 48 mL/min — ABNORMAL LOW (ref >60)
Glucose, Bld: 112 mg/dL — ABNORMAL HIGH (ref 70–99)
Potassium: 4.2 mmol/L (ref 3.5–5.1)
Sodium: 137 mmol/L (ref 135–145)
Total Bilirubin: 0.3 mg/dL (ref 0.3–1.2)
Total Protein: 6.7 g/dL (ref 6.5–8.1)

## 2019-08-29 LAB — GLUCOSE, CAPILLARY
Glucose-Capillary: 79 mg/dL (ref 70–99)
Glucose-Capillary: 127 mg/dL — ABNORMAL HIGH (ref 70–99)

## 2019-08-29 LAB — CBC
HCT: 37.8 % (ref 36.0–46.0)
Hemoglobin: 11.5 g/dL — ABNORMAL LOW (ref 12.0–15.0)
MCH: 26.2 pg (ref 26.0–34.0)
MCHC: 30.4 g/dL (ref 30.0–36.0)
MCV: 86.1 fL (ref 80.0–100.0)
Platelets: 229 10*3/uL (ref 150–400)
RBC: 4.39 MIL/uL (ref 3.87–5.11)
RDW: 14.4 % (ref 11.5–15.5)
WBC: 7.5 10*3/uL (ref 4.0–10.5)
nRBC: 0 % (ref 0.0–0.2)

## 2019-08-29 LAB — CBG MONITORING, ED
Glucose-Capillary: 99 mg/dL (ref 70–99)
Glucose-Capillary: 90 mg/dL (ref 70–99)

## 2019-08-29 LAB — SARS CORONAVIRUS 2 (TAT 6-24 HRS): SARS Coronavirus 2: NEGATIVE

## 2019-08-29 LAB — D-DIMER, QUANTITATIVE: D-Dimer, Quant: 0.94 ug/mL-FEU — ABNORMAL HIGH (ref 0.00–0.50)

## 2019-08-29 LAB — TROPONIN I (HIGH SENSITIVITY)
Troponin I (High Sensitivity): 23 ng/L — ABNORMAL HIGH (ref <18)
Troponin I (High Sensitivity): 23 ng/L — ABNORMAL HIGH (ref <18)
Troponin I (High Sensitivity): 22 ng/L — ABNORMAL HIGH (ref <18)

## 2019-08-29 LAB — HEPARIN LEVEL (UNFRACTIONATED)
Heparin Unfractionated: 0.57 IU/mL (ref 0.30–0.70)
Heparin Unfractionated: 0.4 IU/mL (ref 0.30–0.70)

## 2019-08-29 LAB — HIV ANTIBODY (ROUTINE TESTING W REFLEX): HIV Screen 4th Generation wRfx: NONREACTIVE

## 2019-08-29 MED ORDER — NITROGLYCERIN 0.4 MG SL SUBL
0.8000 mg | SUBLINGUAL_TABLET | Freq: Once | SUBLINGUAL | Status: AC
  Administered 2019-08-29: 15:00:00 0.8 mg via SUBLINGUAL

## 2019-08-29 MED ORDER — HEPARIN (PORCINE) 25000 UT/250ML-% IV SOLN
1100.0000 [IU]/h | INTRAVENOUS | Status: DC
  Administered 2019-08-29 (×2): 1100 [IU]/h via INTRAVENOUS
  Filled 2019-08-29 (×2): qty 250

## 2019-08-29 MED ORDER — SODIUM CHLORIDE 0.9% FLUSH: 3.0000 mL | INTRAVENOUS | Status: DC | PRN

## 2019-08-29 MED ORDER — IOHEXOL 350 MG/ML SOLN
80.0000 mL | Freq: Once | INTRAVENOUS | Status: AC | PRN
  Administered 2019-08-29: 15:00:00 80 mL via INTRAVENOUS

## 2019-08-29 MED ORDER — NITROGLYCERIN 0.4 MG SL SUBL
0.4000 mg | SUBLINGUAL_TABLET | SUBLINGUAL | Status: DC | PRN
  Filled 2019-08-29: qty 1

## 2019-08-29 MED ORDER — ATORVASTATIN CALCIUM 10 MG PO TABS: 10.0000 mg | ORAL_TABLET | Freq: Every day | ORAL | Status: DC

## 2019-08-29 MED ORDER — HYDRALAZINE HCL 10 MG PO TABS
10.0000 mg | ORAL_TABLET | Freq: Two times a day (BID) | ORAL | Status: DC
  Administered 2019-08-29: 10:00:00 10 mg via ORAL
  Filled 2019-08-29 (×3): qty 1

## 2019-08-29 MED ORDER — ASPIRIN EC 81 MG PO TBEC
81.0000 mg | DELAYED_RELEASE_TABLET | Freq: Every day | ORAL | Status: DC
  Administered 2019-08-30 – 2019-08-31 (×2): 81 mg via ORAL
  Filled 2019-08-29 (×2): qty 1

## 2019-08-29 MED ORDER — NITROGLYCERIN 0.4 MG SL SUBL
0.4000 mg | SUBLINGUAL_TABLET | SUBLINGUAL | Status: DC | PRN
  Administered 2019-08-29 (×2): 0.4 mg via SUBLINGUAL
  Filled 2019-08-29 (×2): qty 1

## 2019-08-29 MED ORDER — MORPHINE SULFATE (PF) 2 MG/ML IV SOLN
2.0000 mg | INTRAVENOUS | Status: DC | PRN
  Filled 2019-08-29: qty 1

## 2019-08-29 MED ORDER — SODIUM CHLORIDE 0.9% FLUSH
3.0000 mL | Freq: Two times a day (BID) | INTRAVENOUS | Status: DC
  Administered 2019-08-29 – 2019-08-31 (×5): 3 mL via INTRAVENOUS

## 2019-08-29 MED ORDER — HYDRALAZINE HCL 25 MG PO TABS
25.0000 mg | ORAL_TABLET | Freq: Two times a day (BID) | ORAL | Status: DC
  Administered 2019-08-29 – 2019-08-30 (×2): 25 mg via ORAL
  Filled 2019-08-29 (×2): qty 1

## 2019-08-29 MED ORDER — SODIUM CHLORIDE 0.9 % IV BOLUS
250.0000 mL | Freq: Once | INTRAVENOUS | Status: AC
  Administered 2019-08-29: 250 mL via INTRAVENOUS

## 2019-08-29 MED ORDER — HEPARIN BOLUS VIA INFUSION
4000.0000 [IU] | Freq: Once | INTRAVENOUS | Status: AC
  Administered 2019-08-29: 4000 [IU] via INTRAVENOUS
  Filled 2019-08-29: qty 4000

## 2019-08-29 MED ORDER — SODIUM CHLORIDE 0.9 % IV SOLN
INTRAVENOUS | Status: AC
  Administered 2019-08-29: 16:00:00 via INTRAVENOUS

## 2019-08-29 MED ORDER — SODIUM CHLORIDE 0.9 % IV SOLN: 250.0000 mL | INTRAVENOUS | Status: DC | PRN

## 2019-08-29 MED ORDER — ASPIRIN 81 MG PO CHEW
324.0000 mg | CHEWABLE_TABLET | Freq: Once | ORAL | Status: AC
  Administered 2019-08-29: 324 mg via ORAL
  Filled 2019-08-29: qty 4

## 2019-08-29 MED ORDER — LEVOTHYROXINE SODIUM 50 MCG PO TABS
50.0000 ug | ORAL_TABLET | Freq: Every day | ORAL | Status: DC
  Administered 2019-08-29 – 2019-08-31 (×3): 50 ug via ORAL
  Filled 2019-08-29 (×3): qty 1

## 2019-08-29 MED ORDER — MOMETASONE FURO-FORMOTEROL FUM 200-5 MCG/ACT IN AERO
2.0000 | INHALATION_SPRAY | Freq: Two times a day (BID) | RESPIRATORY_TRACT | Status: DC
  Administered 2019-08-29 – 2019-08-31 (×4): 2 via RESPIRATORY_TRACT
  Filled 2019-08-29: qty 8.8

## 2019-08-29 MED ORDER — HALOPERIDOL 1 MG PO TABS
1.0000 mg | ORAL_TABLET | Freq: Every day | ORAL | Status: DC
  Administered 2019-08-29 – 2019-08-30 (×3): 1 mg via ORAL
  Filled 2019-08-29 (×3): qty 1

## 2019-08-29 MED ORDER — INSULIN ASPART 100 UNIT/ML ~~LOC~~ SOLN
0.0000 [IU] | SUBCUTANEOUS | Status: DC
  Administered 2019-08-29 – 2019-08-30 (×2): 1 [IU] via SUBCUTANEOUS

## 2019-08-29 MED ORDER — METOPROLOL TARTRATE 5 MG/5ML IV SOLN: 5.0000 mg | INTRAVENOUS | Status: DC | PRN

## 2019-08-29 MED ORDER — IOHEXOL 350 MG/ML SOLN
75.0000 mL | Freq: Once | INTRAVENOUS | Status: AC | PRN
  Administered 2019-08-29: 75 mL via INTRAVENOUS

## 2019-08-29 MED ORDER — ATORVASTATIN CALCIUM 40 MG PO TABS
40.0000 mg | ORAL_TABLET | Freq: Every day | ORAL | Status: DC
  Administered 2019-08-29 – 2019-08-30 (×2): 40 mg via ORAL
  Filled 2019-08-29 (×2): qty 1

## 2019-08-29 MED ORDER — ONDANSETRON HCL 4 MG/2ML IJ SOLN: 4.0000 mg | Freq: Four times a day (QID) | INTRAMUSCULAR | Status: DC | PRN

## 2019-08-29 MED ORDER — ALBUTEROL SULFATE HFA 108 (90 BASE) MCG/ACT IN AERS
2.0000 | INHALATION_SPRAY | RESPIRATORY_TRACT | Status: DC | PRN
  Filled 2019-08-29: qty 6.7

## 2019-08-29 MED ORDER — PANTOPRAZOLE SODIUM 40 MG PO TBEC
40.0000 mg | DELAYED_RELEASE_TABLET | Freq: Every day | ORAL | Status: DC
  Administered 2019-08-29 – 2019-08-31 (×3): 40 mg via ORAL
  Filled 2019-08-29 (×3): qty 1

## 2019-08-29 MED ORDER — CARVEDILOL 12.5 MG PO TABS
12.5000 mg | ORAL_TABLET | Freq: Two times a day (BID) | ORAL | Status: DC
  Administered 2019-08-29 – 2019-08-30 (×4): 12.5 mg via ORAL
  Filled 2019-08-29 (×4): qty 1

## 2019-08-29 MED ORDER — METOPROLOL TARTRATE 5 MG/5ML IV SOLN
INTRAVENOUS | Status: AC
  Filled 2019-08-29: qty 5

## 2019-08-29 MED ORDER — ACETAMINOPHEN 325 MG PO TABS: 650.0000 mg | ORAL_TABLET | ORAL | Status: DC | PRN

## 2019-08-29 MED ORDER — NITROGLYCERIN 0.4 MG SL SUBL
SUBLINGUAL_TABLET | SUBLINGUAL | Status: AC
  Administered 2019-08-29: 15:00:00 0.8 mg via SUBLINGUAL
  Filled 2019-08-29: qty 2

## 2019-08-29 NOTE — H&P (Signed)
History and Physical    Maria Franklin [id number] DOB: Jul 19, 1961 DOA: 08/28/2019  PCP: Jearld Fenton, NP   Patient coming from: Home   Chief Complaint: SOB, chest pain   HPI: Maria Franklin is a 58 y.o. female with medical history significant for schizophrenia, hypertension, asthma, hypothyroidism, and type 2 diabetes mellitus, now presenting to the emergency department for evaluation of shortness of breath and chest pain.  Patient reports dyspnea over the past couple weeks and now accompanied by chest pain for the past couple days.  She denied any fevers, chills, or cough associated with this.  Shortness of breath is similar to her experiences with asthma exacerbation but she has not noted any wheezing recently which she usually has with her asthma flares.  Chest pain is been fairly constant with no alleviating or exacerbating factors identified.  Her dyspnea is certainly worse with exertion, but not necessarily the chest pain.  She believes that her mother had DVTs.  Patient reports having left adrenal gland removed last month.  She has not noted any lower extremity swelling or tenderness.  ED Course: Upon arrival to the ED, patient is found to be afebrile, saturating well on room air, and with blood pressure as high as 200/108.  EKG features a sinus rhythm and chest x-ray is notable for atelectasis but no acute cardiopulmonary disease.  Chemistry panel features a creatinine 1.37, similar to prior.  CBC is notable for mild normocytic anemia.  Troponin was slightly elevated at 22, then 23 three and 1/2 hours later.  Patient was treated with 324 mg of aspirin, nitroglycerin, and started on IV heparin in the ED.  Review of Systems:  All other systems reviewed and apart from HPI, are negative.  Past Medical History:  Diagnosis Date  . Allergy   . Anemia   . Angio-edema   . Asthma    ALL THE TIME   LAST FLARE UP 12/2018  . Chicken pox   . Diabetes mellitus without complication ([place])    . GERD (gastroesophageal reflux disease)   . Hyperlipidemia   . Hypertension   . Hyperthyroidism   . Recurrent upper respiratory infection (URI)   . Schizophrenia ([place])   . Sleep apnea    DX 2-3 YRS AGO.  Marland Kitchen Thyroid disease   . Urticaria     Past Surgical History:  Procedure Laterality Date  . SINOSCOPY    . SINUS ENDO W/FUSION Bilateral 01/05/2019   Procedure: ENDOSCOPIC SINUS SURGERY WITH NAVIGATION;  Surgeon: Jerrell Belfast, MD;  Location: [facility];  Service: ENT;  Laterality: Bilateral;  . SINUS IRRIGATION    . TONSILLECTOMY    . TURBINATE REDUCTION N/A 01/05/2019   Procedure: Turbinate Reduction;  Surgeon: Jerrell Belfast, MD;  Location: [facility];  Service: ENT;  Laterality: N/A;     reports that she has quit smoking. She has a 1.50 pack-year smoking history. She has never used smokeless tobacco. She reports that she does not drink alcohol or use drugs.  Allergies  Allergen Reactions  . Bee Pollen Cough  . Pollen Extract Cough  . Latex Itching, Rash and Other (See Comments)    Severe itching    Family History  Problem Relation Age of Onset  . Mental illness Mother   . Hypertension Mother   . Schizophrenia Mother   . Depression Mother   . Cancer Father        LUNG  . Cancer Sister        BREAST  .  Breast cancer Sister   . Mental illness Brother   . Hypertension Brother   . Schizophrenia Brother   . Depression Brother   . Heart disease Maternal Grandmother   . Depression Maternal Grandmother   . Schizophrenia Maternal Grandmother   . Schizophrenia Brother   . Schizophrenia Maternal Uncle   . Breast cancer Maternal Aunt      Prior to Admission medications   Medication Sig Start Date End Date Taking? Authorizing Provider  albuterol (PROVENTIL) (2.5 MG/3ML) 0.083% nebulizer solution Take 3 mLs (2.5 mg total) by nebulization every 4 (four) hours as needed for wheezing or shortness of breath. 12/14/18  Yes Valentina Shaggy, MD  albuterol (VENTOLIN HFA) 108 (90  Base) MCG/ACT inhaler INHALE ONE TO TWO PUFFS INTO LUNGS EVERY 6 HOURS AS NEEDED FOR WHEEZING OR SHORTNESS OF BREATH 02/07/19  Yes Baity, Coralie Keens, NP  atorvastatin (LIPITOR) 10 MG tablet Take 1 tablet (10 mg total) by mouth daily at 6 PM. 07/24/19  Yes Baity, Coralie Keens, NP  budesonide-formoterol (SYMBICORT) 160-4.5 MCG/ACT inhaler Inhale 2 puffs into the lungs 2 (two) times daily. 12/14/18  Yes Valentina Shaggy, MD  carvedilol (COREG) 12.5 MG tablet Take 12.5 mg by mouth 2 (two) times daily. 08/14/18  Yes [provider]  dupilumab (DUPIXENT) 200 MG/1.14ML prefilled syringe Inject 200 mg into the skin every 14 (fourteen) days.   Yes [provider]  ferrous sulfate 325 (65 FE) MG tablet Take 1 tablet (325 mg total) by mouth daily with breakfast. 03/27/19  Yes Baity, Coralie Keens, NP  fluticasone (FLONASE) 50 MCG/ACT nasal spray Place 2 sprays into both nostrils 2 (two) times a day. 07/05/19  Yes Valentina Shaggy, MD  haloperidol (HALDOL) 1 MG tablet Take 1 tablet (1 mg total) by mouth at bedtime. 06/27/19 06/26/20 Yes Arfeen, Arlyce Harman, MD  hydrALAZINE (APRESOLINE) 10 MG tablet Take 10 mg by mouth 2 (two) times daily.   Yes [provider]  levothyroxine (SYNTHROID, LEVOTHROID) 50 MCG tablet Take 1 tablet (50 mcg total) by mouth daily before breakfast. MUST SCHEDULE ANNUAL PHYSICAL 02/07/19  Yes Jearld Fenton, NP  metFORMIN (GLUCOPHAGE) 500 MG tablet Take 500 mg by mouth 2 (two) times daily with a meal.  06/26/19  Yes [provider]  Omega-3 Fatty Acids (FISH OIL PO) Take 1 capsule by mouth daily.   Yes [provider]  omeprazole (PRILOSEC) 40 MG capsule TAKE 1 CAPSULE DAILY Patient taking differently: Take 40 mg by mouth daily.  02/12/19  Yes Baity, Coralie Keens, NP  lisinopril (ZESTRIL) 20 MG tablet Take 1 tablet (20 mg total) by mouth daily. Patient not taking: Reported on 08/29/2019 07/25/19   Jearld Fenton, NP  montelukast (SINGULAIR) 10 MG tablet Take 1 tablet  (10 mg total) by mouth daily. MUST SCHEDULE PHYSICAL EXAM Patient not taking: Reported on 08/29/2019 02/07/19   Jearld Fenton, NP    Physical Exam: Vitals:   08/29/19 0015 08/29/19 0015 08/29/19 0030 08/29/19 0045  BP:  (!) 154/85 (!) 166/95   Pulse: 68 64 67 78  Resp: (!) 22 16 15 19   Temp:  98.2 F (36.8 C)    TempSrc:  Oral    SpO2: 100% 100% 100% 100%  Weight:  113.4 kg    Height:  5\' 2"  (1.575 m)      Constitutional: NAD, calm  Eyes: PERTLA, lids and conjunctivae normal ENMT: Mucous membranes are moist. Posterior pharynx clear of any exudate or lesions.  Neck: normal, supple, no masses, no thyromegaly Respiratory: no wheezing, no crackles. No accessory muscle use.  Cardiovascular: S1 & S2 heard, regular rate and rhythm. Trace lower leg edema bilaterally. Abdomen: No distension, no tenderness, soft. Bowel sounds active.  Musculoskeletal: no clubbing / cyanosis. No joint deformity upper and lower extremities.   Skin: no significant rashes, lesions, ulcers. Warm, dry, well-perfused. Neurologic: CN 2-12 grossly intact. Sensation intact. Strength 5/5 in all 4 limbs.  Psychiatric: Alert and oriented x 3. Pleasant, cooperative.    Labs on Admission: I have personally reviewed following labs and imaging studies  CBC: Recent Labs  Lab 08/28/19 1956  WBC 8.2  HGB 11.0*  HCT 34.8*  MCV 86.8  PLT 99991111   Basic Metabolic Panel: Recent Labs  Lab 08/28/19 1956  NA 138  K 4.6  CL 106  CO2 26  GLUCOSE 102*  BUN 17  CREATININE 1.37*  CALCIUM 9.2   GFR: Estimated Creatinine Clearance: 53.3 mL/min (A) (by C-G formula based on SCr of 1.37 mg/dL (H)). Liver Function Tests: No results for input(s): AST, ALT, ALKPHOS, BILITOT, PROT, ALBUMIN in the last 168 hours. No results for input(s): LIPASE, AMYLASE in the last 168 hours. No results for input(s): AMMONIA in the last 168 hours. Coagulation Profile: No results for input(s): INR, PROTIME in the last 168 hours. Cardiac  Enzymes: No results for input(s): CKTOTAL, CKMB, CKMBINDEX, TROPONINI in the last 168 hours. BNP (last 3 results) No results for input(s): PROBNP in the last 8760 hours. HbA1C: No results for input(s): HGBA1C in the last 72 hours. CBG: No results for input(s): GLUCAP in the last 168 hours. Lipid Profile: No results for input(s): CHOL, HDL, LDLCALC, TRIG, CHOLHDL, LDLDIRECT in the last 72 hours. Thyroid Function Tests: No results for input(s): TSH, T4TOTAL, FREET4, T3FREE, THYROIDAB in the last 72 hours. Anemia Panel: No results for input(s): VITAMINB12, FOLATE, FERRITIN, TIBC, IRON, RETICCTPCT in the last 72 hours. Urine analysis:    Component Value Date/Time   COLORURINE YELLOW 01/14/2014 [place] 01/14/2014 1224   LABSPEC 1.025 05/08/2019 1305   PHURINE 6.0 05/08/2019 1305   GLUCOSEU NEGATIVE 05/08/2019 1305   GLUCOSEU NEGATIVE 01/14/2014 1224   HGBUR NEGATIVE 05/08/2019 1305   BILIRUBINUR NEGATIVE 05/08/2019 1305   KETONESUR NEGATIVE 05/08/2019 1305   PROTEINUR 30 (A) 05/08/2019 1305   UROBILINOGEN 0.2 05/08/2019 1305   NITRITE NEGATIVE 05/08/2019 1305   LEUKOCYTESUR TRACE (A) 05/08/2019 1305   Sepsis Labs: @LABRCNTIP (procalcitonin:4,lacticidven:4) )No results found for this or any previous visit (from the past 240 hour(s)).   Radiological Exams on Admission: Dg Chest 2 View  Result Date: 08/28/2019 CLINICAL DATA:  Back pain, shortness of breath, chest pain EXAM: CHEST - 2 VIEW COMPARISON:  12/04/2018 FINDINGS: Linear subsegmental atelectasis at the right lung base. Left lung clear. No effusions. Heart is normal size. No acute bony abnormality. IMPRESSION: Right base atelectasis.  No active disease. Electronically Signed   By: Rolm Baptise M.D.   On: 08/28/2019 20:31    EKG: Independently reviewed. Sinus rhythm, rate 74, QTc 405 ms.   Assessment/Plan   1. Chest pain, SOB  - Presents with 2 weeks of SOB and 2-3 days of chest pain, not improved with  NTG, no ischemic features noted on EKG, no acute findings on CXR, and troponin 22 and then 23  - She was treated with ASA 324 mg and started on IV heparin infusion in ED  - Continue cardiac monitoring, check d-dimer, trend troponin,  continue ASA, statin, beta-blocker, and continue IV heparin for now    2. Asthma  - She reports recent SOB, but no cough or wheezing  - Continue ICS/LABA and as-needed albuterol   3. CKD II-III  - SCr is 1.37 on admission, similar to priors  - Renally-dose medications, monitor   4. Hypertension with hypertensive urgency  - BP was as high as 200/110 in ED, normalized after treatment with NTG  - Chest pain persists despite her BP normalizing  - She reports a brief headache after taking NTG, but no neuro deficits - Continue Coreg and hydralazine   5. Type II DM  - A1c was 6.7% in August 2020  - Managed with metformin at home, held on admission  - Check CBG's and use a low-intensity SSI with Novolog while in hospital    6. Schizophrenia  - Continue Haldol    PPE: Mask, face shield  DVT prophylaxis: IV heparin  Code Status: Full  Family Communication: Discussed with patient  Consults called: None  Admission status: Observation     Vianne Bulls, MD [facility] Pager [phone]  If 7PM-7AM, please contact night-coverage [email or web address] Password [email or web address]  08/29/2019, 1:55 AM

## 2019-08-29 NOTE — Progress Notes (Signed)
  Echocardiogram 2D Echocardiogram has been attempted. Patient not in room.  Maria Franklin 08/29/2019, 2:42 PM

## 2019-08-29 NOTE — ED Notes (Signed)
Patient wanting to know if she may eat - admitting MD paged since patient NPO - waiting on cardiology to see patient in case they want to schedule a stress test. Patient aware.

## 2019-08-29 NOTE — Progress Notes (Signed)
ANTICOAGULATION CONSULT NOTE - Initial Consult  Pharmacy Consult for heparin Indication: chest pain/ACS  Allergies  Allergen Reactions  . Bee Pollen Cough  . Pollen Extract Cough  . Latex Itching, Rash and Other (See Comments)    Severe itching    Patient Measurements: Height: 5\' 2"  (157.5 cm) Weight: 250 lb (113.4 kg) IBW/kg (Calculated) : 50.1 Heparin Dosing Weight: 80kg  Vital Signs: Temp: 98.2 F (36.8 C) (09/23 0015) Temp Source: Oral (09/23 0015) BP: 154/85 (09/23 0015) Pulse Rate: 64 (09/23 0015)  Labs: Recent Labs    08/28/19 1956 08/28/19 2327  HGB 11.0*  --   HCT 34.8*  --   PLT 238  --   CREATININE 1.37*  --   TROPONINIHS 22* 23*    Estimated Creatinine Clearance: 53.3 mL/min (A) (by C-G formula based on SCr of 1.37 mg/dL (H)).   Medical History: Past Medical History:  Diagnosis Date  . Allergy   . Anemia   . Angio-edema   . Asthma    ALL THE TIME   LAST FLARE UP 12/2018  . Chicken pox   . Diabetes mellitus without complication ([place])   . GERD (gastroesophageal reflux disease)   . Hyperlipidemia   . Hypertension   . Hyperthyroidism   . Recurrent upper respiratory infection (URI)   . Schizophrenia ([place])   . Sleep apnea    DX 2-3 YRS AGO.  Marland Kitchen Thyroid disease   . Urticaria      Assessment: 58yo female c/o SOB x2wk and eventually intermittent CP started ~4d ago, sent to ED from [facility] for hypertensive urgency, troponin found to be mildly elevated, to begin heparin.  Goal of Therapy:  Heparin level 0.3-0.7 units/ml Monitor platelets by anticoagulation protocol: Yes   Plan:  Will give heparin 4000 units IV bolus x1 followed by gtt at 1100 units/hr and monitor heparin levels and CBC.  Wynona Neat, PharmD, BCPS  08/29/2019,12:25 AM

## 2019-08-29 NOTE — Progress Notes (Addendum)
58 year old with prior history of schizophrenia, hypertension, hypothyroidism, asthma and type 2 diabetes mellitus was admitted earlier this morning by Dr..  Presented to ED with shortness of breath and chest pain.  She was admitted for evaluation of ACS and was started on IV heparin.   On examination patient reports she continues to have intermittent ongoing chest pain even at rest.  Cardiology consulted to see if she needs a stress test/cardiac catheterization for further evaluation of intermittent ongoing chest pain/unstable angina.   Alert and in mild distress from chest pain.  CVS s1s2 heard.  Lungs clear to auscultation, no wheezing or rhonchi.  Abdomen soft non tender non distended bowel sounds heard.  Extremities: trace edema.    Plan:  1. Will need inpatient stay, for further evaluation of ongoing intermittent chest pain.  2. Cardiology consulted and  Coronary CTA ordered. Continue with IV heparin. Echocardiogram.  3. Hypertensive emergency : pt admitted with a bp of 200/108 mmhg.    Hosie Poisson, MD [phone]

## 2019-08-29 NOTE — ED Notes (Signed)
ED TO INPATIENT HANDOFF REPORT  ED Nurse Name and Phone #: [phone]  S Name/Age/Gender Maria Franklin 58 y.o. female Room/Bed: 013C/013C  Code Status   Code Status: Full Code  Home/SNF/Other Home Patient oriented to: self, place, time and situation Is this baseline? Yes   Triage Complete: Triage complete  Chief Complaint sob   Triage Note Pt reports back pain and sob x2 weeks, states that 3-4 days ago she began having chest pain as well. States she has a hx of asthma and has been using her singulair, rescue inhaler and breathing txs without resolution. Pt a/ox4, resp mildly labored while talking.    Allergies Allergies  Allergen Reactions  . Bee Pollen Cough  . Pollen Extract Cough  . Latex Itching, Rash and Other (See Comments)    Severe itching    Level of Care/Admitting Diagnosis ED Disposition    ED Disposition Condition Comment   Admit  Hospital Area: [place] [[id number]]  Level of Care: Telemetry Cardiac [103]  I expect the patient will be discharged within 24 hours: No (not a candidate for 5C-Observation unit)  Covid Evaluation: Confirmed COVID Negative  Diagnosis: Chest pain [id number]  Admitting Physician: Hosie Poisson [[id number]]  Attending Physician: Hosie Poisson [[id number]]  PT Class (Do Not Modify): Observation [104]  PT Acc Code (Do Not Modify): Observation [10022]       B Medical/Surgery History Past Medical History:  Diagnosis Date  . Allergy   . Anemia   . Angio-edema   . Asthma    ALL THE TIME   LAST FLARE UP 12/2018  . Chicken pox   . Diabetes mellitus without complication ([place])   . GERD (gastroesophageal reflux disease)   . Hyperlipidemia   . Hypertension   . Hyperthyroidism   . Recurrent upper respiratory infection (URI)   . Schizophrenia ([place])   . Sleep apnea    DX 2-3 YRS AGO.  Marland Kitchen Thyroid disease   . Urticaria    Past Surgical History:  Procedure Laterality Date  . left adrenal gland removal  07/2019  . SINOSCOPY     . SINUS ENDO W/FUSION Bilateral 01/05/2019   Procedure: ENDOSCOPIC SINUS SURGERY WITH NAVIGATION;  Surgeon: Jerrell Belfast, MD;  Location: [place];  Service: ENT;  Laterality: Bilateral;  . SINUS IRRIGATION    . TONSILLECTOMY    . TURBINATE REDUCTION N/A 01/05/2019   Procedure: Turbinate Reduction;  Surgeon: Jerrell Belfast, MD;  Location: [place];  Service: ENT;  Laterality: N/A;     A IV Location/Drains/Wounds Patient Lines/Drains/Airways Status   Active Line/Drains/Airways    Name:   Placement date:   Placement time:   Site:   Days:   Peripheral IV 08/29/19 Right Antecubital   08/29/19    0056    Antecubital   less than 1   Peripheral IV 08/29/19 Left Antecubital   08/29/19    0255    Antecubital   less than 1   Incision (Closed) 01/05/19 Nose Other (Comment)   01/05/19    0910     236          Intake/Output Last 24 hours No intake or output data in the 24 hours ending 08/29/19 1408  Labs/Imaging Results for orders placed or performed during the hospital encounter of 08/28/19 (from the past 48 hour(s))  Basic metabolic panel     Status: Abnormal   Collection Time: 08/28/19  7:56 PM  Result Value Ref Range   Sodium 138 135 -  145 mmol/L   Potassium 4.6 3.5 - 5.1 mmol/L   Chloride 106 98 - 111 mmol/L   CO2 26 22 - 32 mmol/L   Glucose, Bld 102 (H) 70 - 99 mg/dL   BUN 17 6 - 20 mg/dL   Creatinine, Ser 1.37 (H) 0.44 - 1.00 mg/dL   Calcium 9.2 8.9 - 10.3 mg/dL   GFR calc non Af Amer 42 (L) >60 mL/min   GFR calc Af Amer 49 (L) >60 mL/min   Anion gap 6 5 - 15    Comment: Performed at [facility] [address] [place], [place] [address]  CBC     Status: Abnormal   Collection Time: 08/28/19  7:56 PM  Result Value Ref Range   WBC 8.2 4.0 - 10.5 K/uL   RBC 4.01 3.87 - 5.11 MIL/uL   Hemoglobin 11.0 (L) 12.0 - 15.0 g/dL   HCT 34.8 (L) 36.0 - 46.0 %   MCV 86.8 80.0 - 100.0 fL   MCH 27.4 26.0 - 34.0 pg   MCHC 31.6 30.0 - 36.0 g/dL   RDW 14.5 11.5 - 15.5 %   Platelets  238 150 - 400 K/uL   nRBC 0.0 0.0 - 0.2 %    Comment: Performed at [facility], [place] [address] [place], [place] [address]  Troponin I (High Sensitivity)     Status: Abnormal   Collection Time: 08/28/19  7:56 PM  Result Value Ref Range   Troponin I (High Sensitivity) 22 (H) <18 ng/L    Comment: (NOTE) Elevated high sensitivity troponin I (hsTnI) values and significant  changes across serial measurements may suggest ACS but many other  chronic and acute conditions are known to elevate hsTnI results.  Refer to the "Links" section for chest pain algorithms and additional  guidance. Performed at [facility], [place] [address], [place], [place] [address]   I-Stat beta hCG blood, ED     Status: None   Collection Time: 08/28/19  8:14 PM  Result Value Ref Range   I-stat hCG, quantitative <5.0 <5 mIU/mL   Comment 3            Comment:   GEST. AGE      CONC.  (mIU/mL)   <=1 WEEK        5 - 50     2 WEEKS       50 - 500     3 WEEKS       100 - 10,000     4 WEEKS     1,000 - 30,000        FEMALE AND NON-PREGNANT FEMALE:     LESS THAN 5 mIU/mL   Troponin I (High Sensitivity)     Status: Abnormal   Collection Time: 08/28/19 11:27 PM  Result Value Ref Range   Troponin I (High Sensitivity) 23 (H) <18 ng/L    Comment: (NOTE) Elevated high sensitivity troponin I (hsTnI) values and significant  changes across serial measurements may suggest ACS but many other  chronic and acute conditions are known to elevate hsTnI results.  Refer to the "Links" section for chest pain algorithms and additional  guidance. Performed at [facility], [place] [address] [place], [place] [address]   Troponin I (High Sensitivity)     Status: Abnormal   Collection Time: 08/29/19  1:27 AM  Result Value Ref Range   Troponin I (High Sensitivity) 23 (H) <18 ng/L    Comment: (  NOTE) Elevated high sensitivity troponin I (hsTnI) values and significant  changes across serial measurements may suggest ACS but  many other  chronic and acute conditions are known to elevate hsTnI results.  Refer to the "Links" section for chest pain algorithms and additional  guidance. Performed at [facility], [place] [address], [place], [place] [address]   SARS CORONAVIRUS 2 (TAT 6-24 HRS) Nasopharyngeal Nasopharyngeal Swab     Status: None   Collection Time: 08/29/19  1:49 AM   Specimen: Nasopharyngeal Swab  Result Value Ref Range   SARS Coronavirus 2 NEGATIVE NEGATIVE    Comment: (NOTE) SARS-CoV-2 target nucleic acids are NOT DETECTED. The SARS-CoV-2 RNA is generally detectable in upper and lower respiratory specimens during the acute phase of infection. Negative results do not preclude SARS-CoV-2 infection, do not rule out co-infections with other pathogens, and should not be used as the sole basis for treatment or other patient management decisions. Negative results must be combined with clinical observations, patient history, and epidemiological information. The expected result is Negative. Fact Sheet for Patients: SugarRoll.be Fact Sheet for Healthcare Providers: https://www.woods-mathews.com/ This test is not yet approved or cleared by the [place] FDA and  has been authorized for detection and/or diagnosis of SARS-CoV-2 by FDA under an Emergency Use Authorization (EUA). This EUA will remain  in effect (meaning this test can be used) for the duration of the COVID-19 declaration under Section 56 4(b)(1) of the Act, 21 U.S.C. section 360bbb-3(b)(1), unless the authorization is terminated or revoked sooner. Performed at [facility], [place] [address], [place], [place] [address]   Troponin I (High Sensitivity)     Status: Abnormal   Collection Time: 08/29/19  2:54 AM  Result Value Ref Range   Troponin I (High Sensitivity) 22 (H) <18 ng/L    Comment: (NOTE) Elevated high sensitivity troponin I (hsTnI) values and significant  changes across  serial measurements may suggest ACS but many other  chronic and acute conditions are known to elevate hsTnI results.  Refer to the "Links" section for chest pain algorithms and additional  guidance. Performed at [facility], [place] [address], [place], [place] [address]   D-dimer, quantitative (not at [facility])     Status: Abnormal   Collection Time: 08/29/19  2:54 AM  Result Value Ref Range   D-Dimer, Quant 0.94 (H) 0.00 - 0.50 ug/mL-FEU    Comment: (NOTE) At the manufacturer cut-off of 0.50 ug/mL FEU, this assay has been documented to exclude PE with a sensitivity and negative predictive value of 97 to 99%.  At this time, this assay has not been approved by the FDA to exclude DVT/VTE. Results should be correlated with clinical presentation. Performed at [facility], [place] [address] [place],  [address]   HIV antibody (Routine Testing)     Status: None   Collection Time: 08/29/19  5:55 AM  Result Value Ref Range   HIV Screen 4th Generation wRfx Non Reactive Non Reactive    Comment: (NOTE) Performed At: [facility] [place], [place] [id number] Rush Farmer MD [id number]   Comprehensive metabolic panel     Status: Abnormal   Collection Time: 08/29/19  5:55 AM  Result Value Ref Range   Sodium 137 135 - 145 mmol/L   Potassium 4.2 3.5 - 5.1 mmol/L   Chloride 105 98 - 111 mmol/L   CO2 24 22 - 32 mmol/L   Glucose, Bld 112 (H) 70 - 99  mg/dL   BUN 18 6 - 20 mg/dL   Creatinine, Ser 1.23 (H) 0.44 - 1.00 mg/dL   Calcium 8.9 8.9 - 10.3 mg/dL   Total Protein 6.7 6.5 - 8.1 g/dL   Albumin 3.5 3.5 - 5.0 g/dL   AST 20 15 - 41 U/L   ALT 18 0 - 44 U/L   Alkaline Phosphatase 76 38 - 126 U/L   Total Bilirubin 0.3 0.3 - 1.2 mg/dL   GFR calc non Af Amer 48 (L) >60 mL/min   GFR calc Af Amer 56 (L) >60 mL/min   Anion gap 8 5 - 15    Comment: Performed at [facility] [address] [place], [place] [address]  CBG monitoring, ED     Status:  None   Collection Time: 08/29/19  6:03 AM  Result Value Ref Range   Glucose-Capillary 99 70 - 99 mg/dL  Heparin level (unfractionated)     Status: None   Collection Time: 08/29/19 10:08 AM  Result Value Ref Range   Heparin Unfractionated 0.57 0.30 - 0.70 IU/mL    Comment: (NOTE) If heparin results are below expected values, and patient dosage has  been confirmed, suggest follow up testing of antithrombin III levels. Performed at [facility], [place] [address], [place], [place] [address]   CBG monitoring, ED     Status: None   Collection Time: 08/29/19 12:04 PM  Result Value Ref Range   Glucose-Capillary 90 70 - 99 mg/dL   Dg Chest 2 View  Result Date: 08/28/2019 CLINICAL DATA:  Back pain, shortness of breath, chest pain EXAM: CHEST - 2 VIEW COMPARISON:  12/04/2018 FINDINGS: Linear subsegmental atelectasis at the right lung base. Left lung clear. No effusions. Heart is normal size. No acute bony abnormality. IMPRESSION: Right base atelectasis.  No active disease. Electronically Signed   By: Rolm Baptise M.D.   On: 08/28/2019 20:31   Ct Angio Chest Pe W Or Wo Contrast  Result Date: 08/29/2019 CLINICAL DATA:  Chest pain and shortness of breath EXAM: CT ANGIOGRAPHY CHEST WITH CONTRAST TECHNIQUE: Multidetector CT imaging of the chest was performed using the standard protocol during bolus administration of intravenous contrast. Multiplanar CT image reconstructions and MIPs were obtained to evaluate the vascular anatomy. CONTRAST:  62mL OMNIPAQUE IOHEXOL 350 MG/ML SOLN COMPARISON:  Chest radiograph August 28, 2019 FINDINGS: Cardiovascular: There is no demonstrable pulmonary embolus. There is no thoracic aortic aneurysm or dissection. The visualized great vessels appear unremarkable. Note that the right innominate left common carotid arteries arise as a common trunk, an anatomic variant. There is no appreciable pericardial effusion or pericardial thickening. Mediastinum/Nodes: Thyroid appears  mildly inhomogeneous. There is a 1.1 cm mass in the right lobe of the thyroid near the isthmus. There are subcentimeter mediastinal lymph nodes. There is no adenopathy by size criteria. There is a small hiatal hernia. Lungs/Pleura: There is mild atelectatic change in the lung bases. No edema or consolidation. No pleural effusions evident. Upper Abdomen: Visualized upper abdominal structures appear unremarkable. Musculoskeletal: There is degenerative change in the thoracic spine. No blastic or lytic bone lesions. No chest wall lesions. Review of the MIP images confirms the above findings. IMPRESSION: 1. No demonstrable pulmonary embolus. No thoracic aortic aneurysm or dissection. 2.  Mild basilar atelectatic change.  No edema or consolidation. 3.  No evident adenopathy. 4.  Small hiatal hernia. 5. 1.1 cm right thyroid mass. Per consensus guidelines, a mass of this size does not warrant further  imaging assessment. Electronically Signed   By: Lowella Grip III M.D.   On: 08/29/2019 11:43    Pending Labs Unresulted Labs (From admission, onward)    Start     Ordered   08/30/19 0500  Heparin level (unfractionated)  Daily,   R     08/29/19 0031   08/29/19 1600  Heparin level (unfractionated)  Once-Timed,   STAT     08/29/19 1122   08/29/19 0500  CBC  Daily,   R     08/29/19 0031   08/29/19 0310  CBC  Once,   R     08/29/19 0310          Vitals/Pain Today's Vitals   08/29/19 1230 08/29/19 1245 08/29/19 1300 08/29/19 1302  BP: (!) 170/97 (!) 165/94    Pulse: 67 69 66   Resp: (!) 21 11 17    Temp:      TempSrc:      SpO2: 100% 100% 98%   Weight:      Height:      PainSc:    4     Isolation Precautions No active isolations  Medications Medications  heparin ADULT infusion 100 units/mL (25000 units/223mL sodium chloride 0.45%) (1,100 Units/hr Intravenous Rate/Dose Verify 08/29/19 1022)  carvedilol (COREG) tablet 12.5 mg (12.5 mg Oral Given 08/29/19 0842)  haloperidol (HALDOL) tablet 1 mg  (1 mg Oral Given 08/29/19 0311)  levothyroxine (SYNTHROID) tablet 50 mcg (50 mcg Oral Given 08/29/19 0841)  pantoprazole (PROTONIX) EC tablet 40 mg (40 mg Oral Given 08/29/19 0900)  albuterol (VENTOLIN HFA) 108 (90 Base) MCG/ACT inhaler 2 puff (has no administration in time range)  mometasone-formoterol (DULERA) 200-5 MCG/ACT inhaler 2 puff (2 puffs Inhalation Given 08/29/19 1000)  aspirin EC tablet 81 mg (has no administration in time range)  nitroGLYCERIN (NITROSTAT) SL tablet 0.4 mg (has no administration in time range)  acetaminophen (TYLENOL) tablet 650 mg (has no administration in time range)  ondansetron (ZOFRAN) injection 4 mg (has no administration in time range)  insulin aspart (novoLOG) injection 0-9 Units (0 Units Subcutaneous Not Given 08/29/19 1220)  sodium chloride flush (NS) 0.9 % injection 3 mL (has no administration in time range)  sodium chloride flush (NS) 0.9 % injection 3 mL (has no administration in time range)  0.9 %  sodium chloride infusion (has no administration in time range)  morphine 2 MG/ML injection 2-4 mg (has no administration in time range)  hydrALAZINE (APRESOLINE) tablet 25 mg (has no administration in time range)  atorvastatin (LIPITOR) tablet 40 mg (has no administration in time range)  sodium chloride flush (NS) 0.9 % injection 3 mL (3 mLs Intravenous Given 08/29/19 0057)  aspirin chewable tablet 324 mg (324 mg Oral Given 08/29/19 0036)  heparin bolus via infusion 4,000 Units (4,000 Units Intravenous Bolus from Bag 08/29/19 0126)  iohexol (OMNIPAQUE) 350 MG/ML injection 75 mL (75 mLs Intravenous Contrast Given 08/29/19 1127)    Mobility walks with person assist Low fall risk   Focused Assessments Cardiac Assessment Handoff:  Cardiac Rhythm: Normal sinus rhythm Lab Results  Component Value Date   TROPONINI <0.30 01/02/2014   Lab Results  Component Value Date   DDIMER 0.94 (H) 08/29/2019   Does the Patient currently have chest pain? No      R Recommendations: See Admitting Provider Note  Report given to:   Additional Notes:

## 2019-08-29 NOTE — Progress Notes (Signed)
Maria Franklin for heparin Indication: chest pain/ACS  Allergies  Allergen Reactions  . Bee Pollen Cough  . Pollen Extract Cough  . Latex Itching, Rash and Other (See Comments)    Severe itching    Patient Measurements: Height: 5\' 2"  (157.5 cm) Weight: 252 lb 9.6 oz (114.6 kg) IBW/kg (Calculated) : 50.1 Heparin Dosing Weight: 80kg  Vital Signs: Temp: 98.5 F (36.9 C) (09/23 1512) Temp Source: Oral (09/23 1512) BP: 155/106 (09/23 1512) Pulse Rate: 72 (09/23 1512)  Labs: Recent Labs    08/28/19 1956 08/28/19 2327 08/29/19 0127 08/29/19 0254 08/29/19 0555 08/29/19 1008 08/29/19 1531  HGB 11.0*  --   --   --   --   --  11.5*  HCT 34.8*  --   --   --   --   --  37.8  PLT 238  --   --   --   --   --  229  HEPARINUNFRC  --   --   --   --   --  0.57 0.40  CREATININE 1.37*  --   --   --  1.23*  --   --   TROPONINIHS 22* 23* 23* 22*  --   --   --     Estimated Creatinine Clearance: 59.7 mL/min (A) (by C-G formula based on SCr of 1.23 mg/dL (H)).   Medical History: Past Medical History:  Diagnosis Date  . Allergy   . Anemia   . Angio-edema   . Asthma    ALL THE TIME   LAST FLARE UP 12/2018  . Chicken pox   . Diabetes mellitus without complication ([place])   . GERD (gastroesophageal reflux disease)   . Hyperlipidemia   . Hypertension   . Hyperthyroidism   . Recurrent upper respiratory infection (URI)   . Schizophrenia ([place])   . Sleep apnea    DX 2-3 YRS AGO.  Marland Kitchen Thyroid disease   . Urticaria      Assessment: 58yo female c/o SOB x2wk and eventually intermittent CP started ~4d ago, sent to ED from [facility] for hypertensive urgency, troponin found to be mildly elevated, to begin heparin. Not on anticoagulation PTA.. Hg 11, plt wnl on admit. No active bleed issues documented. Noted d-dimer elevated - CTA pending.  Confirmatory heparin level continues to be at goal, no bleeding issues noted.   Goal of Therapy:  Heparin level 0.3-0.7  units/ml Monitor platelets by anticoagulation protocol: Yes   Plan:  Continue heparin at 1100 units/hr Daily heparin level/CBC Monitor s/sx bleeding F/u CTA results, Cardiology plans   Erin Hearing PharmD., BCPS Clinical Pharmacist 08/29/2019 4:28 PM

## 2019-08-29 NOTE — Consult Note (Addendum)
Cardiology Consultation:   Patient ID: Maria Franklin MRN: [id number]; DOB: 02/15/1961  Admit date: 08/28/2019 Date of Consult: 08/29/2019  Primary Care Provider: Jearld Fenton, NP Primary Cardiologist: No primary care provider on file.  Primary Electrophysiologist:  None    Patient Profile:   Maria Franklin is a 58 y.o. 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 Karleen Hampshire.  History of Present Illness:   Maria Franklin has no prior cardiac history. She has a history of hypertension and recent left  adrenal gland removal surgery in August. Prior to the surgery she was on lisinopril, coreg, amlodipine, and hydralazine but still felt her pressures were hard to treat. Since the removal of the adrenal gland she has been taking Coreg and hydralazine as needed, but at home still recording pressures with systolics in the [id number]. She also has history of hyperlipidemia on Atorvastatin 10 mg daily and fish oil. Patient follows with psychiatry for history of paranoid schizophrenia since 2006. She plans on getting a sleep study for OSA.   On 9/15 the patient went to the ER for worsening SOB but was left before she could be seen. She had reported sob for the last 1-2 weeks minimally improved with albuterol inhaler and nebulizers. She also was having severe back pain at that time.   On 08/28/19 in the afternoon the patient took her BP and noticed it was elevated  over [id number] systolic and took a hydralazine. 15 after taking the medication she began to feel a 6/10 centralized chest tightness. It was non radiating. The tightness waxed and wane but was not worse on exertion. Denied associated sob, nausea, vomiting, or diaphoresis. At that point she went to an Urgent care who then referred her to the ED. Upon arrival to the ED chest pain had  improved to 4/10. She said she first noticed chest discomfort on Saturday but was unsure if it was indigestion. Patient was still experiencing SOB as well for weeks despite using albuterol inhaler and nebulizer treatments.   In the ED BP was 154/85. She was afebrile and saturating well on room air. BP were noted to be as high as [id number] systolic. Labs revealed creatinine 1.37. electrolytes were wnl. WBC 8.2, Hgb 11.0. Glucose 102. D-dimer 0.94. CXR showed no acute disease. Hs troponin 22 > 23 > 22. EKG showed NSR, 66bpm with TWI III and AvF. Patient was treated with Aspirin 324 mg, NTG, and started on IV heparin. She was admitted for further work-up.   Patient denies drug/alcohol use. She smoked 5-6 years as a teenager. She cooks meals at home and tries to watch what she eats. Before her adrenal surgery she was very active, walking daily, but has not been active recently. She has noticed pedal edema in the past, but attributed it to taking Amlodipine. Denies orthopnea. Family history is positive for high blood pressure in 6 of her siblings and heart attacks in 3 of her sisters around age 53-60s.   Heart Pathway Score:     Past Medical History:  Diagnosis Date  . Allergy   . Anemia   . Angio-edema   . Asthma    ALL THE TIME   LAST FLARE UP 12/2018  . Chicken pox   . Diabetes mellitus without complication ([place])   . GERD (gastroesophageal reflux disease)   .  Hyperlipidemia   . Hypertension   . Hyperthyroidism   . Recurrent upper respiratory infection (URI)   . Schizophrenia ([place])   . Sleep apnea    DX 2-3 YRS AGO.  Marland Kitchen Thyroid disease   . Urticaria     Past Surgical History:  Procedure Laterality Date  . SINOSCOPY    . SINUS ENDO W/FUSION Bilateral 01/05/2019   Procedure: ENDOSCOPIC SINUS SURGERY WITH NAVIGATION;  Surgeon: Jerrell Belfast, MD;  Location: [facility];  Service: ENT;  Laterality: Bilateral;  . SINUS IRRIGATION    . TONSILLECTOMY    . TURBINATE REDUCTION N/A 01/05/2019   Procedure:  Turbinate Reduction;  Surgeon: Jerrell Belfast, MD;  Location: [facility];  Service: ENT;  Laterality: N/A;     Home Medications:  Prior to Admission medications   Medication Sig Start Date End Date Taking? Authorizing Provider  albuterol (PROVENTIL) (2.5 MG/3ML) 0.083% nebulizer solution Take 3 mLs (2.5 mg total) by nebulization every 4 (four) hours as needed for wheezing or shortness of breath. 12/14/18  Yes Valentina Shaggy, MD  albuterol (VENTOLIN HFA) 108 (90 Base) MCG/ACT inhaler INHALE ONE TO TWO PUFFS INTO LUNGS EVERY 6 HOURS AS NEEDED FOR WHEEZING OR SHORTNESS OF BREATH 02/07/19  Yes Baity, Coralie Keens, NP  atorvastatin (LIPITOR) 10 MG tablet Take 1 tablet (10 mg total) by mouth daily at 6 PM. 07/24/19  Yes Baity, Coralie Keens, NP  budesonide-formoterol (SYMBICORT) 160-4.5 MCG/ACT inhaler Inhale 2 puffs into the lungs 2 (two) times daily. 12/14/18  Yes Valentina Shaggy, MD  carvedilol (COREG) 12.5 MG tablet Take 12.5 mg by mouth 2 (two) times daily. 08/14/18  Yes [provider]  dupilumab (DUPIXENT) 200 MG/1.14ML prefilled syringe Inject 200 mg into the skin every 14 (fourteen) days.   Yes [provider]  ferrous sulfate 325 (65 FE) MG tablet Take 1 tablet (325 mg total) by mouth daily with breakfast. 03/27/19  Yes Baity, Coralie Keens, NP  fluticasone (FLONASE) 50 MCG/ACT nasal spray Place 2 sprays into both nostrils 2 (two) times a day. 07/05/19  Yes Valentina Shaggy, MD  haloperidol (HALDOL) 1 MG tablet Take 1 tablet (1 mg total) by mouth at bedtime. 06/27/19 06/26/20 Yes Arfeen, Arlyce Harman, MD  hydrALAZINE (APRESOLINE) 10 MG tablet Take 10 mg by mouth 2 (two) times daily.   Yes [provider]  levothyroxine (SYNTHROID, LEVOTHROID) 50 MCG tablet Take 1 tablet (50 mcg total) by mouth daily before breakfast. MUST SCHEDULE ANNUAL PHYSICAL 02/07/19  Yes Jearld Fenton, NP  metFORMIN (GLUCOPHAGE) 500 MG tablet Take 500 mg by mouth 2 (two) times daily with a meal.  06/26/19  Yes  [provider]  Omega-3 Fatty Acids (FISH OIL PO) Take 1 capsule by mouth daily.   Yes [provider]  omeprazole (PRILOSEC) 40 MG capsule TAKE 1 CAPSULE DAILY Patient taking differently: Take 40 mg by mouth daily.  02/12/19  Yes Baity, Coralie Keens, NP  lisinopril (ZESTRIL) 20 MG tablet Take 1 tablet (20 mg total) by mouth daily. Patient not taking: Reported on 08/29/2019 07/25/19   Jearld Fenton, NP  montelukast (SINGULAIR) 10 MG tablet Take 1 tablet (10 mg total) by mouth daily. MUST SCHEDULE PHYSICAL EXAM Patient not taking: Reported on 08/29/2019 02/07/19   Jearld Fenton, NP    Inpatient Medications: Scheduled Meds: . [START ON 08/30/2019] aspirin EC  81 mg Oral Daily  . atorvastatin  10 mg Oral q1800  . carvedilol  12.5 mg Oral BID WC  .  haloperidol  1 mg Oral QHS  . hydrALAZINE  10 mg Oral BID  . insulin aspart  0-9 Units Subcutaneous Q4H  . levothyroxine  50 mcg Oral QAC breakfast  . mometasone-formoterol  2 puff Inhalation BID  . pantoprazole  40 mg Oral Daily  . sodium chloride flush  3 mL Intravenous Q12H   Continuous Infusions: . sodium chloride    . heparin 1,100 Units/hr (08/29/19 1022)   PRN Meds: sodium chloride, acetaminophen, albuterol, morphine injection, nitroGLYCERIN, ondansetron (ZOFRAN) IV, sodium chloride flush  Allergies:    Allergies  Allergen Reactions  . Bee Pollen Cough  . Pollen Extract Cough  . Latex Itching, Rash and Other (See Comments)    Severe itching    Social History:   Social History   Socioeconomic History  . Marital status: Married    Spouse name: Not on file  . Number of children: Not on file  . Years of education: Not on file  . Highest education level: Not on file  Occupational History  . Not on file  Social Needs  . Financial resource strain: Not on file  . Food insecurity    Worry: Not on file    Inability: Not on file  . Transportation needs    Medical: Not on file    Non-medical: Not on file  Tobacco  Use  . Smoking status: Former Smoker    Packs/day: 0.25    Years: 6.00    Pack years: 1.50  . Smokeless tobacco: Never Used  . Tobacco comment: QUIT IN HER EARLY 20'S  Substance and Sexual Activity  . Alcohol use: No    Alcohol/week: 0.0 standard drinks  . Drug use: No  . Sexual activity: Not Currently  Lifestyle  . Physical activity    Days per week: Not on file    Minutes per session: Not on file  . Stress: Not on file  Relationships  . Social Herbalist on phone: Not on file    Gets together: Not on file    Attends religious service: Not on file    Active member of club or organization: Not on file    Attends meetings of clubs or organizations: Not on file    Relationship status: Not on file  . Intimate partner violence    Fear of current or ex partner: Not on file    Emotionally abused: Not on file    Physically abused: Not on file    Forced sexual activity: Not on file  Other Topics Concern  . 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 Mental illness Mother   . Hypertension Mother   . Schizophrenia Mother   . Depression Mother   . Cancer Father        LUNG  . Cancer Sister        BREAST  . Breast cancer Sister   . Mental illness Brother   . Hypertension Brother   . Schizophrenia Brother   . Depression Brother   . Heart disease Maternal Grandmother   . Depression Maternal Grandmother   . Schizophrenia Maternal Grandmother   . Schizophrenia  Brother   . Schizophrenia Maternal Uncle   . Breast cancer Maternal Aunt      ROS:  Please see the history of present illness.  All other ROS reviewed and negative.     Physical Exam/Data:   Vitals:   08/29/19 0730 08/29/19  0843 08/29/19 0930 08/29/19 1015  BP: (!) 145/76 (!) 158/90 (!) 148/90 (!) 148/92  Pulse: 65 65 65 70  Resp: 15 20 13 15   Temp:      TempSrc:      SpO2: 100% 100% 99% 98%  Weight:      Height:       No intake or output data in the 24 hours ending 08/29/19 1108 Last 3 Weights 08/29/2019 08/21/2019 07/25/2019  Weight (lbs) 250 lb 250 lb 250 lb  Weight (kg) 113.399 kg 113.399 kg 113.399 kg  Some encounter information is confidential and restricted. Go to Review Flowsheets activity to see all data.     Body mass index is 45.73 kg/m.  General:  Well nourished, well developed obese AAF, in no acute distress HEENT: normal Lymph: no adenopathy Neck: no JVD Endocrine:  No thryomegaly Vascular: No carotid bruits; FA pulses 2+ bilaterally without bruits  Cardiac:  normal S1, S2; RRR; no murmur  Lungs:  clear to auscultation bilaterally, no wheezing, rhonchi or rales  Abd: soft, nontender, no hepatomegaly  Ext: no edema Musculoskeletal:  No deformities, BUE and BLE strength normal and equal Skin: warm and dry  Neuro:  CNs 2-12 intact, no focal abnormalities noted Psych:  Normal affect   EKG:  The EKG was personally reviewed and demonstrates:  08/28/19 NSR, 66 bpm, TWI III and AvF,  Telemetry:  Telemetry was personally reviewed and demonstrates:  NSR HR in the 60s, no other arrhythmias noted  Relevant CV Studies:  Echo ordered   Laboratory Data:  High Sensitivity Troponin:   Recent Labs  Lab 08/28/19 1956 08/28/19 2327 08/29/19 0127 08/29/19 0254  TROPONINIHS 22* 23* 23* 22*     Chemistry Recent Labs  Lab 08/28/19 1956 08/29/19 0555  NA 138 137  K 4.6 4.2  CL 106 105  CO2 26 24  GLUCOSE 102* 112*  BUN 17 18  CREATININE 1.37* 1.23*  CALCIUM 9.2 8.9  GFRNONAA 42* 48*  GFRAA 49* 56*  ANIONGAP 6 8    Recent Labs  Lab 08/29/19 0555  PROT 6.7  ALBUMIN 3.5  AST 20  ALT 18  ALKPHOS 76  BILITOT 0.3   Hematology Recent Labs  Lab 08/28/19 1956  WBC 8.2  RBC  4.01  HGB 11.0*  HCT 34.8*  MCV 86.8  MCH 27.4  MCHC 31.6  RDW 14.5  PLT 238   BNPNo results for input(s): BNP, PROBNP in the last 168 hours.  DDimer  Recent Labs  Lab 08/29/19 0254  DDIMER 0.94*     Radiology/Studies:  Dg Chest 2 View  Result Date: 08/28/2019 CLINICAL DATA:  Back pain, shortness of breath, chest pain EXAM: CHEST - 2 VIEW COMPARISON:  12/04/2018 FINDINGS: Linear subsegmental atelectasis at the right lung base. Left lung clear. No effusions. Heart is normal size. No acute bony abnormality. IMPRESSION: Right base atelectasis.  No active disease. Electronically Signed   By: Rolm Baptise M.D.   On: 08/28/2019 20:31    Assessment and Plan:   Chest pain/SOB Presented with 3-4 days of acute onset left-sided chest pain that radiates to her back. Noted a 2 week history of worsening sob. EKG with no acute  changes. HS troponin 22>23>23. Patient was given aspirin and NTG in the ER and started on IV heparin. D-dimer was negative. COVID negative. CT chest negative for PE and dissection; no edema or consolidation. - Chest pain in the setting of hypertensive urgency. Pressures have since improved. On coreg and hydralazine.  - Chest tightness improved with NTG, but still 4/10 waxing and waning - Troponins flat and EKG with no acute changes - Patient with no known history of CAD but has many risk factors: HTN, hyperlipidemia, DM2, obesity - Will get an echo to evaluate LV function - Given risk factors and family history will order Coronary CTA  Hypertensive Urgency/Hypertension Recent left adrenal gland surgery with medication changes. Coreg and hydralazine home meds - BP in the ER up to 200/110. Pressures improved after NTG, but chest pain persistent. - Coreg 12.5 mg and hydralazine 10 mg TID continued on admission - Pressures are still slightly elevated >> continue to monitor - Echo ordered  Asthma/SOB - Recent SOB despite taking home meds - CXR with no acute process -  Lungs clear on exam - per IM  ?Renal Insufficiency -1.37 on admission >> 1.23 today - Appears to have normal creatinine in April 2020 - Avoid nephrotoxic agents   Hyperlipidemia - LDL 106 07/25/19 - Atorvastatin 10mg  and fish old home meds - Would increase atorvastatin   DM2 - A1C 6.7 07/25/19 - SSI per IM  OSA - Plan for sleep study   Schizophrenia - Stable on Haldol - Follows with psychiatry outpatient - per IM  BMI 45 - Recommend weight loss/lifestyle changes  Hypothyroidism - on levothyroxine - TSH 2.14 07/25/19 - Thyroid mass seen on CT>> follow up outpatient  For questions or updates, please contact [facility] Please consult [email or web address] for contact info under     Signed, Cadence Ninfa Meeker, PA-C  08/29/2019 11:08 AM   I have examined the patient and reviewed assessment and plan and discussed with patient.  Agree with above as stated.    She has chest discomfort with RF for CAD.  Some typical and some atypical features.  At the moment, she is pain free.  Low level troponin without a significant change.  Continue RF modification including DM management, weight loss for morbid obesity.  Need improved BP control as well.     Larae Grooms

## 2019-08-29 NOTE — Plan of Care (Signed)

## 2019-08-29 NOTE — Progress Notes (Signed)
[facility] for heparin Indication: chest pain/ACS  Allergies  Allergen Reactions  . Bee Pollen Cough  . Pollen Extract Cough  . Latex Itching, Rash and Other (See Comments)    Severe itching    Patient Measurements: Height: 5\' 2"  (157.5 cm) Weight: 250 lb (113.4 kg) IBW/kg (Calculated) : 50.1 Heparin Dosing Weight: 80kg  Vital Signs: Temp: 98.2 F (36.8 C) (09/23 0015) Temp Source: Oral (09/23 0015) BP: 148/92 (09/23 1015) Pulse Rate: 70 (09/23 1015)  Labs: Recent Labs    08/28/19 1956 08/28/19 2327 08/29/19 0127 08/29/19 0254 08/29/19 0555 08/29/19 1008  HGB 11.0*  --   --   --   --   --   HCT 34.8*  --   --   --   --   --   PLT 238  --   --   --   --   --   HEPARINUNFRC  --   --   --   --   --  0.57  CREATININE 1.37*  --   --   --  1.23*  --   TROPONINIHS 22* 23* 23* 22*  --   --     Estimated Creatinine Clearance: 59.3 mL/min (A) (by C-G formula based on SCr of 1.23 mg/dL (H)).   Medical History: Past Medical History:  Diagnosis Date  . Allergy   . Anemia   . Angio-edema   . Asthma    ALL THE TIME   LAST FLARE UP 12/2018  . Chicken pox   . Diabetes mellitus without complication ([place])   . GERD (gastroesophageal reflux disease)   . Hyperlipidemia   . Hypertension   . Hyperthyroidism   . Recurrent upper respiratory infection (URI)   . Schizophrenia ([place])   . Sleep apnea    DX 2-3 YRS AGO.  Marland Kitchen Thyroid disease   . Urticaria      Assessment: 58yo female c/o SOB x2wk and eventually intermittent CP started ~4d ago, sent to ED from [facility] for hypertensive urgency, troponin found to be mildly elevated, to begin heparin. Not on anticoagulation PTA. Initial heparin level therapeutic. Hg 11, plt wnl on admit. No active bleed issues documented. Noted d-dimer elevated - CTA pending.  Goal of Therapy:  Heparin level 0.3-0.7 units/ml Monitor platelets by anticoagulation protocol: Yes   Plan:  Continue heparin at 1100  units/hr 6h confirmatory heparin level Daily heparin level/CBC Monitor s/sx bleeding F/u CTA results, Cardiology plans   Elicia Lamp, PharmD, BCPS Please check AMION for all [facility] contact numbers Clinical Pharmacist 08/29/2019 11:20 AM

## 2019-08-29 NOTE — ED Notes (Signed)
Patient transported to CT 

## 2019-08-30 ENCOUNTER — Inpatient Hospital Stay (HOSPITAL_COMMUNITY): Payer: Medicare HMO

## 2019-08-30 ENCOUNTER — Encounter (HOSPITAL_COMMUNITY): Payer: Self-pay | Admitting: General Practice

## 2019-08-30 DIAGNOSIS — R51 Headache: Secondary | ICD-10-CM | POA: Diagnosis present

## 2019-08-30 DIAGNOSIS — E039 Hypothyroidism, unspecified: Secondary | ICD-10-CM | POA: Diagnosis not present

## 2019-08-30 DIAGNOSIS — G4733 Obstructive sleep apnea (adult) (pediatric): Secondary | ICD-10-CM | POA: Diagnosis present

## 2019-08-30 DIAGNOSIS — I129 Hypertensive chronic kidney disease with stage 1 through stage 4 chronic kidney disease, or unspecified chronic kidney disease: Secondary | ICD-10-CM | POA: Diagnosis not present

## 2019-08-30 DIAGNOSIS — R0602 Shortness of breath: Secondary | ICD-10-CM | POA: Diagnosis not present

## 2019-08-30 DIAGNOSIS — Z79899 Other long term (current) drug therapy: Secondary | ICD-10-CM | POA: Diagnosis not present

## 2019-08-30 DIAGNOSIS — N183 Chronic kidney disease, stage 3 (moderate): Secondary | ICD-10-CM | POA: Diagnosis not present

## 2019-08-30 DIAGNOSIS — E119 Type 2 diabetes mellitus without complications: Secondary | ICD-10-CM | POA: Diagnosis not present

## 2019-08-30 DIAGNOSIS — Z91040 Latex allergy status: Secondary | ICD-10-CM | POA: Diagnosis not present

## 2019-08-30 DIAGNOSIS — I161 Hypertensive emergency: Secondary | ICD-10-CM | POA: Diagnosis present

## 2019-08-30 DIAGNOSIS — J9811 Atelectasis: Secondary | ICD-10-CM | POA: Diagnosis not present

## 2019-08-30 DIAGNOSIS — D638 Anemia in other chronic diseases classified elsewhere: Secondary | ICD-10-CM | POA: Diagnosis present

## 2019-08-30 DIAGNOSIS — F200 Paranoid schizophrenia: Secondary | ICD-10-CM | POA: Diagnosis not present

## 2019-08-30 DIAGNOSIS — E785 Hyperlipidemia, unspecified: Secondary | ICD-10-CM | POA: Diagnosis present

## 2019-08-30 DIAGNOSIS — Z79890 Hormone replacement therapy (postmenopausal): Secondary | ICD-10-CM | POA: Diagnosis not present

## 2019-08-30 DIAGNOSIS — R0789 Other chest pain: Secondary | ICD-10-CM | POA: Diagnosis not present

## 2019-08-30 DIAGNOSIS — R079 Chest pain, unspecified: Secondary | ICD-10-CM | POA: Diagnosis not present

## 2019-08-30 DIAGNOSIS — Z6841 Body Mass Index (BMI) 40.0 and over, adult: Secondary | ICD-10-CM | POA: Diagnosis not present

## 2019-08-30 DIAGNOSIS — M549 Dorsalgia, unspecified: Secondary | ICD-10-CM | POA: Diagnosis present

## 2019-08-30 DIAGNOSIS — Z8249 Family history of ischemic heart disease and other diseases of the circulatory system: Secondary | ICD-10-CM | POA: Diagnosis not present

## 2019-08-30 DIAGNOSIS — Z7984 Long term (current) use of oral hypoglycemic drugs: Secondary | ICD-10-CM | POA: Diagnosis not present

## 2019-08-30 DIAGNOSIS — J4530 Mild persistent asthma, uncomplicated: Secondary | ICD-10-CM | POA: Diagnosis not present

## 2019-08-30 DIAGNOSIS — Z803 Family history of malignant neoplasm of breast: Secondary | ICD-10-CM | POA: Diagnosis not present

## 2019-08-30 DIAGNOSIS — E1122 Type 2 diabetes mellitus with diabetic chronic kidney disease: Secondary | ICD-10-CM | POA: Diagnosis not present

## 2019-08-30 DIAGNOSIS — I10 Essential (primary) hypertension: Secondary | ICD-10-CM

## 2019-08-30 DIAGNOSIS — R072 Precordial pain: Secondary | ICD-10-CM | POA: Diagnosis not present

## 2019-08-30 DIAGNOSIS — Z20828 Contact with and (suspected) exposure to other viral communicable diseases: Secondary | ICD-10-CM | POA: Diagnosis not present

## 2019-08-30 DIAGNOSIS — K219 Gastro-esophageal reflux disease without esophagitis: Secondary | ICD-10-CM | POA: Diagnosis present

## 2019-08-30 LAB — CBC
HCT: 32.5 % — ABNORMAL LOW (ref 36.0–46.0)
Hemoglobin: 10.3 g/dL — ABNORMAL LOW (ref 12.0–15.0)
MCH: 27 pg (ref 26.0–34.0)
MCHC: 31.7 g/dL (ref 30.0–36.0)
MCV: 85.3 fL (ref 80.0–100.0)
Platelets: 216 10*3/uL (ref 150–400)
RBC: 3.81 MIL/uL — ABNORMAL LOW (ref 3.87–5.11)
RDW: 14.6 % (ref 11.5–15.5)
WBC: 6.8 10*3/uL (ref 4.0–10.5)
nRBC: 0 % (ref 0.0–0.2)

## 2019-08-30 LAB — GLUCOSE, CAPILLARY
Glucose-Capillary: 102 mg/dL — ABNORMAL HIGH (ref 70–99)
Glucose-Capillary: 110 mg/dL — ABNORMAL HIGH (ref 70–99)
Glucose-Capillary: 130 mg/dL — ABNORMAL HIGH (ref 70–99)
Glucose-Capillary: 89 mg/dL (ref 70–99)
Glucose-Capillary: 94 mg/dL (ref 70–99)

## 2019-08-30 LAB — BASIC METABOLIC PANEL
Anion gap: 8 (ref 5–15)
BUN: 15 mg/dL (ref 6–20)
CO2: 25 mmol/L (ref 22–32)
Calcium: 9.1 mg/dL (ref 8.9–10.3)
Chloride: 106 mmol/L (ref 98–111)
Creatinine, Ser: 1.17 mg/dL — ABNORMAL HIGH (ref 0.44–1.00)
GFR calc Af Amer: 59 mL/min — ABNORMAL LOW (ref >60)
GFR calc non Af Amer: 51 mL/min — ABNORMAL LOW (ref >60)
Glucose, Bld: 132 mg/dL — ABNORMAL HIGH (ref 70–99)
Potassium: 4.4 mmol/L (ref 3.5–5.1)
Sodium: 139 mmol/L (ref 135–145)

## 2019-08-30 LAB — ECHOCARDIOGRAM COMPLETE
Height: 62 in
Weight: 4051.2 oz

## 2019-08-30 LAB — HEPARIN LEVEL (UNFRACTIONATED): Heparin Unfractionated: 0.51 IU/mL (ref 0.30–0.70)

## 2019-08-30 LAB — NOVEL CORONAVIRUS, NAA (HOSP ORDER, SEND-OUT TO REF LAB; TAT 18-24 HRS): SARS-CoV-2, NAA: NOT DETECTED

## 2019-08-30 MED ORDER — TRAMADOL HCL 50 MG PO TABS: 100.0000 mg | ORAL_TABLET | Freq: Four times a day (QID) | ORAL | Status: DC | PRN

## 2019-08-30 MED ORDER — CARVEDILOL 25 MG PO TABS
25.0000 mg | ORAL_TABLET | Freq: Two times a day (BID) | ORAL | Status: DC
  Administered 2019-08-31: 25 mg via ORAL
  Filled 2019-08-30 (×2): qty 1

## 2019-08-30 MED ORDER — LABETALOL HCL 5 MG/ML IV SOLN
10.0000 mg | INTRAVENOUS | Status: DC | PRN
  Administered 2019-08-30 (×2): 10 mg via INTRAVENOUS
  Filled 2019-08-30 (×2): qty 4

## 2019-08-30 MED ORDER — CARVEDILOL 25 MG PO TABS: 25.0000 mg | ORAL_TABLET | Freq: Two times a day (BID) | ORAL | Status: DC

## 2019-08-30 MED ORDER — METHOCARBAMOL 1000 MG/10ML IJ SOLN
500.0000 mg | Freq: Three times a day (TID) | INTRAVENOUS | Status: DC | PRN
  Administered 2019-08-30: 500 mg via INTRAVENOUS
  Filled 2019-08-30 (×2): qty 5

## 2019-08-30 MED ORDER — LISINOPRIL 20 MG PO TABS
20.0000 mg | ORAL_TABLET | Freq: Every day | ORAL | Status: DC
  Administered 2019-08-30 – 2019-08-31 (×2): 20 mg via ORAL
  Filled 2019-08-30 (×2): qty 1

## 2019-08-30 MED ORDER — HYDRALAZINE HCL 50 MG PO TABS
50.0000 mg | ORAL_TABLET | Freq: Two times a day (BID) | ORAL | Status: DC
  Administered 2019-08-30 – 2019-08-31 (×2): 50 mg via ORAL
  Filled 2019-08-30 (×2): qty 1

## 2019-08-30 MED ORDER — METHOCARBAMOL 1000 MG/10ML IJ SOLN
500.0000 mg | Freq: Four times a day (QID) | INTRAVENOUS | Status: DC | PRN
  Filled 2019-08-30: qty 5

## 2019-08-30 NOTE — Progress Notes (Signed)
  Echocardiogram 2D Echocardiogram has been performed.  Maria Franklin 08/30/2019, 12:18 PM

## 2019-08-30 NOTE — Progress Notes (Addendum)
PROGRESS NOTE    Maria Franklin  [id number] DOB: December 17, 1960 DOA: 08/28/2019 PCP: Jearld Fenton, NP  Brief Narrative:   58 year old with prior history of schizophrenia, hypertension, hypothyroidism, asthma and type 2 diabetes mellitus resented to ED with shortness of breath and chest pressure and back pain.  She was admitted for evaluation of ACS cardiology was consulted.  She had minimally elevated troponins, EKG does not show any ST elevations.  She underwent CT angiogram of the chest which was negative for PE. Cardiology ordered CT coronaries the score was 0.  But patient continues to have intermittent chest pressure radiating to the back and back pain radiating to the shoulders.  Patient seen and examined at bedside today with the above complaints Assessment & Plan:   Principal Problem:   Chest pain Active Problems:   Hypertension   Hypothyroidism   Paranoid schizophrenia, chronic condition ([place])   DM2 (diabetes mellitus, type 2) (HCC)   Asthma, mild persistent   CKD (chronic kidney disease), stage III ([place])   Hypertensive urgency   Hypertensive emergency   Atypical chest pain;  Patient was initially started on IV heparin for possible NSTEMI.  It was ruled out and IV heparin stopped this morning Probably musculoskeletal versus probably from hypertensive emergency, as her CT angiogram of the chest ruled out acute PE and coronary CTA was negative.  Echocardiogram ordered and pending. Appreciate cardiology recommendations. Minimally elevated troponins probably from hypertensive emergency. Added IV Robaxin and IV morphine for musculoskeletal pain.  Patient continues to have intermittent and severe back pain requiring IV Robaxin and IV pain medication.    Hypertensive emergency Patient's blood pressures ranging from 150s to 200s over 100, Coreg increased from 12.5 twice daily to 25 mg twice daily. Hydralazine 25 mg twice daily, increase it to 50 mg 3 times daily.  lisinopril 20 mg daily was added by cardiology.    Asthma CT chest x-ray negative for acute cardiopulmonary disease No wheezing heard.  Shortness of breath has resolved.   Hyperlipidemia LDL 106 Statin increased to 40 mg daily   Stage III CKD Creatinine at baseline.   Paranoid schizophrenia Resume home medications.   Mild normocytic anemia/anemia of chronic disease Baseline hemoglobin around 11.    Type 2 diabetes mellitus: Well controlled CBG (last 3)  Recent Labs    08/30/19 0548 08/30/19 1213 08/30/19 1708  GLUCAP 94 110* 89   Resume SSI.  A1c is 6.7   Severity of Illness: The appropriate patient status for this patient is INPATIENT. Inpatient status is judged to be reasonable and necessary in order to provide the required intensity of service to ensure the patient's safety. The patient's presenting symptoms, physical exam findings, and initial radiographic and laboratory data in the context of their chronic comorbidities is felt to place them at high risk for further clinical deterioration. Furthermore, it is not anticipated that the patient will be medically stable for discharge from the hospital within 2 midnights of admission. The following factors support the patient status of inpatient.   " The patient's presenting symptoms include intermittent chest pressure and back pain requiring frequent IV pain medications and IV muscle relaxants.  " The initial radiographic and laboratory data are worrisome because of BP 200/100'S, requiring IV anti hypertensive medications.  " The chronic co-morbidities include hypertension and DM.    * I certify that at the point of admission it is my clinical judgment that the patient will require inpatient hospital care spanning beyond 2 midnights from  the point of admission due to high intensity of service, high risk for further deterioration and high frequency of surveillance required.*   Late entry of inpatient admission  ordered.   DVT prophylaxis: SCDs Code Status: Full code Family Communication: None at bedside Disposition Plan: Pending resolution of hypertensive emergency and pain control   Consultants:   Cardiology.   Procedures: CT coronaries Echocardiogram.   Antimicrobials: none.   Subjective: Back pain and some chest pressure present, intermittent, not relating to breathing , the back pain comes when she moves her upper extremities.   Objective: Vitals:   08/30/19 0859 08/30/19 1016 08/30/19 1216 08/30/19 1707  BP: (!) 155/102 (!) 164/109 (!) 181/110 (!) 206/114  Pulse: 79 69 76 64  Resp:   20 18  Temp: 98.1 F (36.7 C)  98.2 F (36.8 C) 98.6 F (37 C)  TempSrc: Oral  Oral Oral  SpO2: 99%  97% 99%  Weight:      Height:        Intake/Output Summary (Last 24 hours) at 08/30/2019 1712 Last data filed at 08/30/2019 1224 Gross per 24 hour  Intake 966.64 ml  Output 1400 ml  Net -433.36 ml   Filed Weights   08/29/19 0015 08/29/19 1512 08/30/19 0528  Weight: 113.4 kg 114.6 kg 114.9 kg    Examination:  General exam: mild distress from back pain.  Respiratory system: Clear to auscultation. Respiratory effort normal. Cardiovascular system: S1 & S2 heard, RRR. No JVD,  Gastrointestinal system: Abdomen is nondistended, soft and nontender. No organomegaly or masses felt. Normal bowel sounds heard. Central nervous system: Alert and oriented. No focal neurological deficits. Extremities: Symmetric 5 x 5 power. Skin: No rashes, lesions or ulcers Psychiatry anxious    Data Reviewed: I have personally reviewed following labs and imaging studies  CBC: Recent Labs  Lab 08/28/19 1956 08/29/19 1531 08/30/19 0409  WBC 8.2 7.5 6.8  HGB 11.0* 11.5* 10.3*  HCT 34.8* 37.8 32.5*  MCV 86.8 86.1 85.3  PLT 238 229 [id number]   Basic Metabolic Panel: Recent Labs  Lab 08/28/19 1956 08/29/19 0555 08/30/19 0942  NA 138 137 139  K 4.6 4.2 4.4  CL 106 105 106  CO2 26 24 25   GLUCOSE 102*  112* 132*  BUN 17 18 15   CREATININE 1.37* 1.23* 1.17*  CALCIUM 9.2 8.9 9.1   GFR: Estimated Creatinine Clearance: 62.9 mL/min (A) (by C-G formula based on SCr of 1.17 mg/dL (H)). Liver Function Tests: Recent Labs  Lab 08/29/19 0555  AST 20  ALT 18  ALKPHOS 76  BILITOT 0.3  PROT 6.7  ALBUMIN 3.5   No results for input(s): LIPASE, AMYLASE in the last 168 hours. No results for input(s): AMMONIA in the last 168 hours. Coagulation Profile: No results for input(s): INR, PROTIME in the last 168 hours. Cardiac Enzymes: No results for input(s): CKTOTAL, CKMB, CKMBINDEX, TROPONINI in the last 168 hours. BNP (last 3 results) No results for input(s): PROBNP in the last 8760 hours. HbA1C: No results for input(s): HGBA1C in the last 72 hours. CBG: Recent Labs  Lab 08/29/19 1614 08/29/19 2136 08/30/19 0024 08/30/19 0548 08/30/19 1213  GLUCAP 79 127* 102* 94 110*   Lipid Profile: No results for input(s): CHOL, HDL, LDLCALC, TRIG, CHOLHDL, LDLDIRECT in the last 72 hours. Thyroid Function Tests: No results for input(s): TSH, T4TOTAL, FREET4, T3FREE, THYROIDAB in the last 72 hours. Anemia Panel: No results for input(s): VITAMINB12, FOLATE, FERRITIN, TIBC, IRON, RETICCTPCT in the last 72 hours.  Sepsis Labs: No results for input(s): PROCALCITON, LATICACIDVEN in the last 168 hours.  Recent Results (from the past 240 hour(s))  SARS CORONAVIRUS 2 (TAT 6-24 HRS) Nasopharyngeal Nasopharyngeal Swab     Status: None   Collection Time: 08/29/19  1:49 AM   Specimen: Nasopharyngeal Swab  Result Value Ref Range Status   SARS Coronavirus 2 NEGATIVE NEGATIVE Final    Comment: (NOTE) SARS-CoV-2 target nucleic acids are NOT DETECTED. The SARS-CoV-2 RNA is generally detectable in upper and lower respiratory specimens during the acute phase of infection. Negative results do not preclude SARS-CoV-2 infection, do not rule out co-infections with other pathogens, and should not be used as the sole  basis for treatment or other patient management decisions. Negative results must be combined with clinical observations, patient history, and epidemiological information. The expected result is Negative. Fact Sheet for Patients: SugarRoll.be Fact Sheet for Healthcare Providers: https://www.woods-mathews.com/ This test is not yet approved or cleared by the [place] FDA and  has been authorized for detection and/or diagnosis of SARS-CoV-2 by FDA under an Emergency Use Authorization (EUA). This EUA will remain  in effect (meaning this test can be used) for the duration of the COVID-19 declaration under Section 56 4(b)(1) of the Act, 21 U.S.C. section 360bbb-3(b)(1), unless the authorization is terminated or revoked sooner. Performed at [facility], [place] [address] [place], [place] [address]          Radiology Studies: Dg Chest 2 View  Result Date: 08/28/2019 CLINICAL DATA:  Back pain, shortness of breath, chest pain EXAM: CHEST - 2 VIEW COMPARISON:  12/04/2018 FINDINGS: Linear subsegmental atelectasis at the right lung base. Left lung clear. No effusions. Heart is normal size. No acute bony abnormality. IMPRESSION: Right base atelectasis.  No active disease. Electronically Signed   By: Rolm Baptise M.D.   On: 08/28/2019 20:31   Ct Angio Chest Pe W Or Wo Contrast  Result Date: 08/29/2019 CLINICAL DATA:  Chest pain and shortness of breath EXAM: CT ANGIOGRAPHY CHEST WITH CONTRAST TECHNIQUE: Multidetector CT imaging of the chest was performed using the standard protocol during bolus administration of intravenous contrast. Multiplanar CT image reconstructions and MIPs were obtained to evaluate the vascular anatomy. CONTRAST:  40mL OMNIPAQUE IOHEXOL 350 MG/ML SOLN COMPARISON:  Chest radiograph August 28, 2019 FINDINGS: Cardiovascular: There is no demonstrable pulmonary embolus. There is no thoracic aortic aneurysm or dissection. The  visualized great vessels appear unremarkable. Note that the right innominate left common carotid arteries arise as a common trunk, an anatomic variant. There is no appreciable pericardial effusion or pericardial thickening. Mediastinum/Nodes: Thyroid appears mildly inhomogeneous. There is a 1.1 cm mass in the right lobe of the thyroid near the isthmus. There are subcentimeter mediastinal lymph nodes. There is no adenopathy by size criteria. There is a small hiatal hernia. Lungs/Pleura: There is mild atelectatic change in the lung bases. No edema or consolidation. No pleural effusions evident. Upper Abdomen: Visualized upper abdominal structures appear unremarkable. Musculoskeletal: There is degenerative change in the thoracic spine. No blastic or lytic bone lesions. No chest wall lesions. Review of the MIP images confirms the above findings. IMPRESSION: 1. No demonstrable pulmonary embolus. No thoracic aortic aneurysm or dissection. 2.  Mild basilar atelectatic change.  No edema or consolidation. 3.  No evident adenopathy. 4.  Small hiatal hernia. 5. 1.1 cm right thyroid mass. Per consensus guidelines, a mass of this size does not warrant further imaging assessment. Electronically Signed   By: Lowella Grip  III M.D.   On: 08/29/2019 11:43   Ct Coronary Morph W/cta Cor W/score W/ca W/cm &/or Wo/cm  Addendum Date: 08/29/2019   ADDENDUM REPORT: 08/29/2019 17:39 CLINICAL DATA:  16F with hypertension, GERD, diabetes and chest pain. EXAM: Cardiac/Coronary  CT TECHNIQUE: The patient was scanned on a [employer]. FINDINGS: A 120 kV prospective scan was triggered in the descending thoracic aorta at 111 HU's. Axial non-contrast 3 mm slices were carried out through the heart. The data set was analyzed on a dedicated work station and scored using the [place]. Gantry rotation speed was 250 msecs and collimation was .6 mm. No beta blockade and 0.8 mg of sl NTG was given. The 3D data set was reconstructed  in 5% intervals of the 67-82 % of the R-R cycle. Diastolic phases were analyzed on a dedicated work station using MPR, MIP and VRT modes. The patient received 80 cc of contrast. Aorta: Mild ascending aorta aneurysm. No calcifications. No dissection. Aortic Valve:  Trileaf[place].  No calcifications. Coronary Arteries:  Normal coronary origin.  Right dominance. RCA is a non-dominant artery that gives rise to PDA and PLVB. There is no plaque. Left main is a large artery that gives rise to LAD and LCX arteries. LAD is a large vessel that has no plaque. There is a large, branching D1 and small D2 without significant stenosis. D3 is too small to characterize. LCX is a large, dominant artery that gives rise to two large OM branches, 3 small OM3, OM4, OM5, and PDA. There is no plaque. Other findings: Normal pulmonary vein drainage into the left atrium. Normal let atrial appendage without a thrombus. Normal size of the pulmonary artery. IMPRESSION: 1. Coronary calcium score of 0. This was 0 percentile for age and sex matched control. 2. Normal coronary origin with right dominance. 3. No evidence of CAD. Skeet Latch, MD Electronically Signed   By: Skeet Latch   On: 08/29/2019 17:39   Result Date: 08/29/2019 EXAM: OVER-READ INTERPRETATION  CT CHEST The following report is an over-read performed by radiologist Dr. Suzy Bouchard of [facility], [place] on 08/29/2019. This over-read does not include interpretation of cardiac or coronary anatomy or pathology. The coronary CTA interpretation by the cardiologist is attached. COMPARISON:  None. FINDINGS: Limited view of the lung parenchyma demonstrates no suspicious nodularity. Airways are normal. Limited view of the mediastinum demonstrates no adenopathy. Esophagus normal. Limited view of the upper abdomen unremarkable. Limited view of the skeleton and chest wall is unremarkable. IMPRESSION: No significant extracardiac findings. Electronically Signed: By: Suzy Bouchard M.D. On: 08/29/2019 15:15        Scheduled Meds: . aspirin EC  81 mg Oral Daily  . atorvastatin  40 mg Oral q1800  . [START ON 08/31/2019] carvedilol  25 mg Oral BID WC  . haloperidol  1 mg Oral QHS  . hydrALAZINE  50 mg Oral BID  . insulin aspart  0-9 Units Subcutaneous Q4H  . levothyroxine  50 mcg Oral QAC breakfast  . lisinopril  20 mg Oral Daily  . mometasone-formoterol  2 puff Inhalation BID  . pantoprazole  40 mg Oral Daily  . sodium chloride flush  3 mL Intravenous Q12H   Continuous Infusions: . sodium chloride    . methocarbamol (ROBAXIN) IV       LOS: 0 days        Hosie Poisson, MD [facility] Pager [phone]   If 7PM-7AM, please contact night-coverage [email or web address] Password [facility] 08/30/2019, 5:12 PM

## 2019-08-30 NOTE — Plan of Care (Signed)
  Problem: Education: Goal: Knowledge of General Education information will improve Description: Including pain rating scale, medication(s)/side effects and non-pharmacologic comfort measures Outcome: Progressing   Problem: Health Behavior/Discharge Planning: Goal: Ability to manage health-related needs will improve Outcome: Progressing    Problem: Clinical Measurements: Goal: Diagnostic test results will improve Outcome: Progressing   Problem: Nutrition: Goal: Adequate nutrition will be maintained Outcome: Progressing   Problem: Pain Managment: Goal: General experience of comfort will improve Outcome: Progressing

## 2019-08-30 NOTE — Progress Notes (Addendum)
Progress Note  Patient Name: Maria Franklin Date of Encounter: 08/30/2019  Primary Cardiologist: Larae Grooms, MD   Subjective   Patient has not had recurrent chest pain but is having some back pain. Sob improved.   Inpatient Medications    Scheduled Meds: . aspirin EC  81 mg Oral Daily  . atorvastatin  40 mg Oral q1800  . carvedilol  12.5 mg Oral BID WC  . haloperidol  1 mg Oral QHS  . hydrALAZINE  25 mg Oral BID  . insulin aspart  0-9 Units Subcutaneous Q4H  . levothyroxine  50 mcg Oral QAC breakfast  . mometasone-formoterol  2 puff Inhalation BID  . pantoprazole  40 mg Oral Daily  . sodium chloride flush  3 mL Intravenous Q12H   Continuous Infusions: . sodium chloride     PRN Meds: sodium chloride, acetaminophen, metoprolol tartrate, morphine injection, nitroGLYCERIN, ondansetron (ZOFRAN) IV, sodium chloride flush   Vital Signs    Vitals:   08/30/19 0528 08/30/19 0729 08/30/19 0838 08/30/19 0859  BP:  (!) 166/104  (!) 155/102  Pulse:  68  79  Resp:  18    Temp:  98 F (36.7 C)  98.1 F (36.7 C)  TempSrc:  Oral  Oral  SpO2:  100% 98% 99%  Weight: 114.9 kg     Height:        Intake/Output Summary (Last 24 hours) at 08/30/2019 0929 Last data filed at 08/30/2019 0850 Gross per 24 hour  Intake 1206.64 ml  Output 401 ml  Net 805.64 ml   Last 3 Weights 08/30/2019 08/29/2019 08/29/2019  Weight (lbs) 253 lb 3.2 oz 252 lb 9.6 oz 250 lb  Weight (kg) 114.851 kg 114.579 kg 113.399 kg  Some encounter information is confidential and restricted. Go to Review Flowsheets activity to see all data.      Telemetry    NSR Hr 70-80s, no other arrhythmias noted - Personally Reviewed  ECG    No new - Personally Reviewed  Physical Exam   GEN: No acute distress.   Neck: No JVD Cardiac: RRR, no murmurs, rubs, or gallops.  Respiratory: Clear to auscultation bilaterally. GI: Soft, nontender, non-distended  MS: No edema; No deformity. Neuro:  Nonfocal  Psych:  Normal affect   Labs    High Sensitivity Troponin:   Recent Labs  Lab 08/28/19 1956 08/28/19 2327 08/29/19 0127 08/29/19 0254  TROPONINIHS 22* 23* 23* 22*      Chemistry Recent Labs  Lab 08/28/19 1956 08/29/19 0555  NA 138 137  K 4.6 4.2  CL 106 105  CO2 26 24  GLUCOSE 102* 112*  BUN 17 18  CREATININE 1.37* 1.23*  CALCIUM 9.2 8.9  PROT  --  6.7  ALBUMIN  --  3.5  AST  --  20  ALT  --  18  ALKPHOS  --  76  BILITOT  --  0.3  GFRNONAA 42* 48*  GFRAA 49* 56*  ANIONGAP 6 8     Hematology Recent Labs  Lab 08/28/19 1956 08/29/19 1531 08/30/19 0409  WBC 8.2 7.5 6.8  RBC 4.01 4.39 3.81*  HGB 11.0* 11.5* 10.3*  HCT 34.8* 37.8 32.5*  MCV 86.8 86.1 85.3  MCH 27.4 26.2 27.0  MCHC 31.6 30.4 31.7  RDW 14.5 14.4 14.6  PLT 238 229 216    BNPNo results for input(s): BNP, PROBNP in the last 168 hours.   DDimer  Recent Labs  Lab 08/29/19 0254  DDIMER 0.94*  Radiology    Dg Chest 2 View  Result Date: 08/28/2019 CLINICAL DATA:  Back pain, shortness of breath, chest pain EXAM: CHEST - 2 VIEW COMPARISON:  12/04/2018 FINDINGS: Linear subsegmental atelectasis at the right lung base. Left lung clear. No effusions. Heart is normal size. No acute bony abnormality. IMPRESSION: Right base atelectasis.  No active disease. Electronically Signed   By: Rolm Baptise M.D.   On: 08/28/2019 20:31   Ct Angio Chest Pe W Or Wo Contrast  Result Date: 08/29/2019 CLINICAL DATA:  Chest pain and shortness of breath EXAM: CT ANGIOGRAPHY CHEST WITH CONTRAST TECHNIQUE: Multidetector CT imaging of the chest was performed using the standard protocol during bolus administration of intravenous contrast. Multiplanar CT image reconstructions and MIPs were obtained to evaluate the vascular anatomy. CONTRAST:  65mL OMNIPAQUE IOHEXOL 350 MG/ML SOLN COMPARISON:  Chest radiograph August 28, 2019 FINDINGS: Cardiovascular: There is no demonstrable pulmonary embolus. There is no thoracic aortic  aneurysm or dissection. The visualized great vessels appear unremarkable. Note that the right innominate left common carotid arteries arise as a common trunk, an anatomic variant. There is no appreciable pericardial effusion or pericardial thickening. Mediastinum/Nodes: Thyroid appears mildly inhomogeneous. There is a 1.1 cm mass in the right lobe of the thyroid near the isthmus. There are subcentimeter mediastinal lymph nodes. There is no adenopathy by size criteria. There is a small hiatal hernia. Lungs/Pleura: There is mild atelectatic change in the lung bases. No edema or consolidation. No pleural effusions evident. Upper Abdomen: Visualized upper abdominal structures appear unremarkable. Musculoskeletal: There is degenerative change in the thoracic spine. No blastic or lytic bone lesions. No chest wall lesions. Review of the MIP images confirms the above findings. IMPRESSION: 1. No demonstrable pulmonary embolus. No thoracic aortic aneurysm or dissection. 2.  Mild basilar atelectatic change.  No edema or consolidation. 3.  No evident adenopathy. 4.  Small hiatal hernia. 5. 1.1 cm right thyroid mass. Per consensus guidelines, a mass of this size does not warrant further imaging assessment. Electronically Signed   By: Lowella Grip III M.D.   On: 08/29/2019 11:43   Ct Coronary Morph W/cta Cor W/score W/ca W/cm &/or Wo/cm  Addendum Date: 08/29/2019   ADDENDUM REPORT: 08/29/2019 17:39 CLINICAL DATA:  38F with hypertension, GERD, diabetes and chest pain. EXAM: Cardiac/Coronary  CT TECHNIQUE: The patient was scanned on a [employer]. FINDINGS: A 120 kV prospective scan was triggered in the descending thoracic aorta at 111 HU's. Axial non-contrast 3 mm slices were carried out through the heart. The data set was analyzed on a dedicated work station and scored using the [place]. Gantry rotation speed was 250 msecs and collimation was .6 mm. No beta blockade and 0.8 mg of sl NTG was given. The  3D data set was reconstructed in 5% intervals of the 67-82 % of the R-R cycle. Diastolic phases were analyzed on a dedicated work station using MPR, MIP and VRT modes. The patient received 80 cc of contrast. Aorta: Mild ascending aorta aneurysm. No calcifications. No dissection. Aortic Valve:  Trileaflet.  No calcifications. Coronary Arteries:  Normal coronary origin.  Right dominance. RCA is a non-dominant artery that gives rise to PDA and PLVB. There is no plaque. Left main is a large artery that gives rise to LAD and LCX arteries. LAD is a large vessel that has no plaque. There is a large, branching D1 and small D2 without significant stenosis. D3 is too small to characterize. LCX is a large, dominant  artery that gives rise to two large OM branches, 3 small OM3, OM4, OM5, and PDA. There is no plaque. Other findings: Normal pulmonary vein drainage into the left atrium. Normal let atrial appendage without a thrombus. Normal size of the pulmonary artery. IMPRESSION: 1. Coronary calcium score of 0. This was 0 percentile for age and sex matched control. 2. Normal coronary origin with right dominance. 3. No evidence of CAD. Skeet Latch, MD Electronically Signed   By: Skeet Latch   On: 08/29/2019 17:39   Result Date: 08/29/2019 EXAM: OVER-READ INTERPRETATION  CT CHEST The following report is an over-read performed by radiologist Dr. Suzy Bouchard of [facility], [place] on 08/29/2019. This over-read does not include interpretation of cardiac or coronary anatomy or pathology. The coronary CTA interpretation by the cardiologist is attached. COMPARISON:  None. FINDINGS: Limited view of the lung parenchyma demonstrates no suspicious nodularity. Airways are normal. Limited view of the mediastinum demonstrates no adenopathy. Esophagus normal. Limited view of the upper abdomen unremarkable. Limited view of the skeleton and chest wall is unremarkable. IMPRESSION: No significant extracardiac findings.  Electronically Signed: By: Suzy Bouchard M.D. On: 08/29/2019 15:15    Cardiac Studies   Coronary CTA 08/29/19 IMPRESSION: 1. Coronary calcium score of 0. This was 0 percentile for age and sex matched control. 2. Normal coronary origin with right dominance. 3. No evidence of CAD.  Echo Ordered   Patient Profile     58 y.o. 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   Assessment & Plan    Chest pain Patient presented yesterday with chest pain. EKG with no acute changes. HS troponin 22>23. Pain slowly improved yesterday after ASA, NTG and IV heparin. CT negative for PE. Patient was hypertensive yesterday to systolic [id number].  - Chest pain in the setting of hypertensive emergency - Coronary CT showed calcium score 0 with no evidence of CAD - Echo pending - Patient denies chest pain, but does have some back pain - Pressures are still elevated >> will add back lisinopril - Low suspicion for ACS at this point. No further ischemic work-up at this time  Hypertensive urgency/Hypertension - Elevated to [id number] systolic yesterday - Coreg continued on admission - Hydralazine changed to 25 mg twice daily - Pressures still elevated 155/102 today - Will add back lisinopril 20 mg daily  Asthma/SOB - Recent SOB despite home meds - CXR negative for acute process - Breathing improved since yesterday after oxygen via [place]  Hyperlipidemia - LDL 106 07/25/19 - Atorvastatin increased to 40 mg  Renal Insufficiency - 1.23 yesterday  - AM labs pending  DM2 - last A1C 6.7 - SSI per IM  For questions or updates, please contact [facility] Please consult [email or web address] for contact info under        Signed, Cadence Ninfa Meeker, PA-C  08/30/2019, 9:29 AM    I have examined the patient and reviewed assessment and plan and discussed with patient.  Agree with above  as stated.    No CAD by CT.  Renal function stable.  COntinue preventive therapy.  Echo pending.    Larae Grooms

## 2019-08-31 ENCOUNTER — Encounter (HOSPITAL_COMMUNITY): Payer: Self-pay

## 2019-08-31 DIAGNOSIS — I161 Hypertensive emergency: Principal | ICD-10-CM

## 2019-08-31 DIAGNOSIS — J4530 Mild persistent asthma, uncomplicated: Secondary | ICD-10-CM

## 2019-08-31 LAB — GLUCOSE, CAPILLARY
Glucose-Capillary: 101 mg/dL — ABNORMAL HIGH (ref 70–99)
Glucose-Capillary: 106 mg/dL — ABNORMAL HIGH (ref 70–99)
Glucose-Capillary: 98 mg/dL (ref 70–99)

## 2019-08-31 MED ORDER — HYDRALAZINE HCL 10 MG PO TABS: 25.0000 mg | ORAL_TABLET | Freq: Two times a day (BID) | ORAL | 1 refills | Status: DC

## 2019-08-31 MED ORDER — LISINOPRIL 20 MG PO TABS: 20.0000 mg | ORAL_TABLET | Freq: Every day | ORAL | 3 refills | Status: DC

## 2019-08-31 MED ORDER — ATORVASTATIN CALCIUM 40 MG PO TABS: 40.0000 mg | ORAL_TABLET | Freq: Every day | ORAL | 1 refills | Status: DC

## 2019-08-31 MED ORDER — ASPIRIN 81 MG PO TBEC: 81.0000 mg | DELAYED_RELEASE_TABLET | Freq: Every day | ORAL | 0 refills | Status: AC

## 2019-08-31 MED ORDER — CARVEDILOL 25 MG PO TABS: 25.0000 mg | ORAL_TABLET | Freq: Two times a day (BID) | ORAL | 0 refills | Status: DC

## 2019-08-31 MED ORDER — METHOCARBAMOL 500 MG PO TABS: 500.0000 mg | ORAL_TABLET | Freq: Three times a day (TID) | ORAL | 0 refills | Status: DC | PRN

## 2019-09-02 NOTE — Discharge Summary (Signed)
Physician Discharge Summary  Maria Franklin [id number] DOB: 12-20-1960 DOA: 08/28/2019  PCP: Jearld Fenton, NP  Admit date: 08/28/2019 Discharge date: 08/31/2019  Admitted From: Home Disposition: Home.   Recommendations for Outpatient Follow-up:  1. Follow up with PCP in 1-2 weeks 2. Please obtain BMP/CBC in one week  Discharge Condition: stable CODE STATUS: full code Diet recommendation: Heart Healthy   Brief/Interim Summary: 58 year old with prior history of schizophrenia, hypertension, hypothyroidism, asthma and type 2 diabetes mellitus resented to ED with shortness of breath and chest pressure and back pain.  She was admitted for evaluation of ACS cardiology was consulted.  She had minimally elevated troponins, EKG does not show any ST elevations.  She underwent CT angiogram of the chest which was negative for PE. Cardiology ordered CT coronaries the score was 0.  But patient continues to have intermittent chest pressure radiating to the back and back pain radiating to the shoulders. She was monitored overnight for pain control.    Discharge Diagnoses:  Principal Problem:   Chest pain Active Problems:   Hypertension   Hypothyroidism   Paranoid schizophrenia, chronic condition ([place])   DM2 (diabetes mellitus, type 2) (HCC)   Asthma, mild persistent   CKD (chronic kidney disease), stage III ([place])   Hypertensive urgency   Hypertensive emergency  Atypical chest pain;  Patient was initially started on IV heparin for possible NSTEMI.  It was ruled out and IV heparin stopped. Probably musculoskeletal versus probably from hypertensive emergency, as her CT angiogram of the chest ruled out acute PE and coronary CTA was negative.  Echocardiogram ordered and showed Left ventricular ejection fraction, by visual estimation, is 60 to 65%. The left ventricle has normal function. There is no left ventricular hypertrophy. Normal left ventricular size. Spectral Doppler shows Left  ventricular diastolic  parameters were normal pattern of LV diastolic filling. Appreciate cardiology recommendations. With IV robaxin and IV morphine his chest pain resolved.   Minimally elevated troponins probably from hypertensive emergency.     Hypertensive emergency Patient's blood pressures ranging from 150s to 200/ 100, Coreg increased from 12.5 twice daily to 25 mg twice daily. Resume hydralazine and lisinopril on discharge.     Asthma CT chest x-ray negative for acute cardiopulmonary disease No wheezing heard.     Hyperlipidemia LDL 106 Statin increased to 40 mg daily   Stage III CKD Creatinine at baseline.   Paranoid schizophrenia Resume home medications.   Mild normocytic anemia/anemia of chronic disease Baseline hemoglobin around 11.    Type 2 diabetes mellitus: Well controlled CBG (last 3)  Recent Labs    08/31/19 0018 08/31/19 0343 08/31/19 0722  GLUCAP 106* 98 101*    Resume SSI.  A1c is 6.7   Discharge Instructions  Discharge Instructions    Diet - low sodium heart healthy   Complete by: As directed    Discharge instructions   Complete by: As directed    Please follow up with PCP in one week.     Allergies as of 08/31/2019      Reactions   Bee Pollen Cough   Pollen Extract Cough   Latex Itching, Rash, Other (See Comments)   Severe itching      Medication List    TAKE these medications   albuterol (2.5 MG/3ML) 0.083% nebulizer solution Commonly known as: PROVENTIL Take 3 mLs (2.5 mg total) by nebulization every 4 (four) hours as needed for wheezing or shortness of breath.   albuterol 108 (90 Base) MCG/ACT  inhaler Commonly known as: Ventolin HFA INHALE ONE TO TWO PUFFS INTO LUNGS EVERY 6 HOURS AS NEEDED FOR WHEEZING OR SHORTNESS OF BREATH   aspirin 81 MG EC tablet Take 1 tablet (81 mg total) by mouth daily.   atorvastatin 40 MG tablet Commonly known as: LIPITOR Take 1 tablet (40 mg total) by mouth daily  at 6 PM. What changed:   medication strength  how much to take   budesonide-formoterol 160-4.5 MCG/ACT inhaler Commonly known as: Symbicort Inhale 2 puffs into the lungs 2 (two) times daily.   carvedilol 25 MG tablet Commonly known as: COREG Take 1 tablet (25 mg total) by mouth 2 (two) times daily with a meal. What changed:   medication strength  how much to take  when to take this   dupilumab 200 MG/1.14ML prefilled syringe Commonly known as: DUPIXENT Inject 200 mg into the skin every 14 (fourteen) days.   ferrous sulfate 325 (65 FE) MG tablet Take 1 tablet (325 mg total) by mouth daily with breakfast.   FISH OIL PO Take 1 capsule by mouth daily.   fluticasone 50 MCG/ACT nasal spray Commonly known as: FLONASE Place 2 sprays into both nostrils 2 (two) times a day.   haloperidol 1 MG tablet Commonly known as: HALDOL Take 1 tablet (1 mg total) by mouth at bedtime.   hydrALAZINE 10 MG tablet Commonly known as: APRESOLINE Take 2.5 tablets (25 mg total) by mouth 2 (two) times daily. What changed: how much to take   levothyroxine 50 MCG tablet Commonly known as: SYNTHROID Take 1 tablet (50 mcg total) by mouth daily before breakfast. MUST SCHEDULE ANNUAL PHYSICAL   lisinopril 20 MG tablet Commonly known as: ZESTRIL Take 1 tablet (20 mg total) by mouth daily.   metFORMIN 500 MG tablet Commonly known as: GLUCOPHAGE Take 500 mg by mouth 2 (two) times daily with a meal.   methocarbamol 500 MG tablet Commonly known as: Robaxin Take 1 tablet (500 mg total) by mouth every 8 (eight) hours as needed for muscle spasms.   montelukast 10 MG tablet Commonly known as: SINGULAIR Take 1 tablet (10 mg total) by mouth daily. MUST SCHEDULE PHYSICAL EXAM   omeprazole 40 MG capsule Commonly known as: PRILOSEC TAKE 1 CAPSULE DAILY      Follow-up Information    Jearld Fenton, NP. Go on 09/06/2019.   Specialties: Internal Medicine, Emergency Medicine Why: @11 :45am Contact  information: [place] [address] [phone]          Allergies  Allergen Reactions  . Bee Pollen Cough  . Pollen Extract Cough  . Latex Itching, Rash and Other (See Comments)    Severe itching    Consultations:  Cardiology.    Procedures/Studies: Dg Chest 2 View  Result Date: 08/28/2019 CLINICAL DATA:  Back pain, shortness of breath, chest pain EXAM: CHEST - 2 VIEW COMPARISON:  12/04/2018 FINDINGS: Linear subsegmental atelectasis at the right lung base. Left lung clear. No effusions. Heart is normal size. No acute bony abnormality. IMPRESSION: Right base atelectasis.  No active disease. Electronically Signed   By: Rolm Baptise M.D.   On: 08/28/2019 20:31   Ct Angio Chest Pe W Or Wo Contrast  Result Date: 08/29/2019 CLINICAL DATA:  Chest pain and shortness of breath EXAM: CT ANGIOGRAPHY CHEST WITH CONTRAST TECHNIQUE: Multidetector CT imaging of the chest was performed using the standard protocol during bolus administration of intravenous contrast. Multiplanar CT image reconstructions and MIPs were obtained to evaluate  the vascular anatomy. CONTRAST:  97mL OMNIPAQUE IOHEXOL 350 MG/ML SOLN COMPARISON:  Chest radiograph August 28, 2019 FINDINGS: Cardiovascular: There is no demonstrable pulmonary embolus. There is no thoracic aortic aneurysm or dissection. The visualized great vessels appear unremarkable. Note that the right innominate left common carotid arteries arise as a common trunk, an anatomic variant. There is no appreciable pericardial effusion or pericardial thickening. Mediastinum/Nodes: Thyroid appears mildly inhomogeneous. There is a 1.1 cm mass in the right lobe of the thyroid near the isthmus. There are subcentimeter mediastinal lymph nodes. There is no adenopathy by size criteria. There is a small hiatal hernia. Lungs/Pleura: There is mild atelectatic change in the lung bases. No edema or consolidation. No pleural effusions evident. Upper Abdomen:  Visualized upper abdominal structures appear unremarkable. Musculoskeletal: There is degenerative change in the thoracic spine. No blastic or lytic bone lesions. No chest wall lesions. Review of the MIP images confirms the above findings. IMPRESSION: 1. No demonstrable pulmonary embolus. No thoracic aortic aneurysm or dissection. 2.  Mild basilar atelectatic change.  No edema or consolidation. 3.  No evident adenopathy. 4.  Small hiatal hernia. 5. 1.1 cm right thyroid mass. Per consensus guidelines, a mass of this size does not warrant further imaging assessment. Electronically Signed   By: Lowella Grip III M.D.   On: 08/29/2019 11:43   Ct Coronary Morph W/cta Cor W/score W/ca W/cm &/or Wo/cm  Addendum Date: 08/29/2019   ADDENDUM REPORT: 08/29/2019 17:39 CLINICAL DATA:  55F with hypertension, GERD, diabetes and chest pain. EXAM: Cardiac/Coronary  CT TECHNIQUE: The patient was scanned on a [employer]. FINDINGS: A 120 kV prospective scan was triggered in the descending thoracic aorta at 111 HU's. Axial non-contrast 3 mm slices were carried out through the heart. The data set was analyzed on a dedicated work station and scored using the Holt. Gantry rotation speed was 250 msecs and collimation was .6 mm. No beta blockade and 0.8 mg of sl NTG was given. The 3D data set was reconstructed in 5% intervals of the 67-82 % of the R-R cycle. Diastolic phases were analyzed on a dedicated work station using MPR, MIP and VRT modes. The patient received 80 cc of contrast. Aorta: Mild ascending aorta aneurysm. No calcifications. No dissection. Aortic Valve:  Trileaflet.  No calcifications. Coronary Arteries:  Normal coronary origin.  Right dominance. RCA is a non-dominant artery that gives rise to PDA and PLVB. There is no plaque. Left main is a large artery that gives rise to LAD and LCX arteries. LAD is a large vessel that has no plaque. There is a large, branching D1 and small D2 without  significant stenosis. D3 is too small to characterize. LCX is a large, dominant artery that gives rise to two large OM branches, 3 small OM3, OM4, OM5, and PDA. There is no plaque. Other findings: Normal pulmonary vein drainage into the left atrium. Normal let atrial appendage without a thrombus. Normal size of the pulmonary artery. IMPRESSION: 1. Coronary calcium score of 0. This was 0 percentile for age and sex matched control. 2. Normal coronary origin with right dominance. 3. No evidence of CAD. Skeet Latch, MD Electronically Signed   By: Skeet Latch   On: 08/29/2019 17:39   Result Date: 08/29/2019 EXAM: OVER-READ INTERPRETATION  CT CHEST The following report is an over-read performed by radiologist Dr. Suzy Bouchard of [facility], [place] on 08/29/2019. This over-read does not include interpretation of cardiac or coronary anatomy or pathology. The coronary  CTA interpretation by the cardiologist is attached. COMPARISON:  None. FINDINGS: Limited view of the lung parenchyma demonstrates no suspicious nodularity. Airways are normal. Limited view of the mediastinum demonstrates no adenopathy. Esophagus normal. Limited view of the upper abdomen unremarkable. Limited view of the skeleton and chest wall is unremarkable. IMPRESSION: No significant extracardiac findings. Electronically Signed: By: Suzy Bouchard M.D. On: 08/29/2019 15:15       Subjective: Chest pain resolved.   Discharge Exam: Vitals:   08/31/19 0754 08/31/19 0830  BP:  (!) 148/79  Pulse: 66   Resp: 16   Temp:    SpO2: 99%    Vitals:   08/30/19 2008 08/31/19 0630 08/31/19 0754 08/31/19 0830  BP:  125/80  (!) 148/79  Pulse:  62 66   Resp:   16   Temp:  98.3 F (36.8 C)    TempSrc:  Oral    SpO2: 100% 100% 99%   Weight:  113.8 kg    Height:        General: Pt is alert, awake, not in acute distress Cardiovascular: RRR, S1/S2 +, no rubs, no gallops Respiratory: CTA bilaterally, no wheezing, no  rhonchi Abdominal: Soft, NT, ND, bowel sounds + Extremities: no edema, no cyanosis    The results of significant diagnostics from this hospitalization (including imaging, microbiology, ancillary and laboratory) are listed below for reference.     Microbiology: Recent Results (from the past 240 hour(s))  Novel Coronavirus, NAA (Hosp order, Send-out to Ref Lab; TAT 18-24 hrs     Status: None   Collection Time: 08/28/19 11:27 PM   Specimen: Nasopharyngeal Swab; Respiratory  Result Value Ref Range Status   SARS-CoV-2, NAA NOT DETECTED NOT DETECTED Final    Comment: (NOTE) This nucleic acid amplification test was developed and its performance characteristics determined by [employer]. Nucleic acid amplification tests include PCR and TMA. This test has not been FDA cleared or approved. This test has been authorized by FDA under an Emergency Use Authorization (EUA). This test is only authorized for the duration of time the declaration that circumstances exist justifying the authorization of the emergency use of in vitro diagnostic tests for detection of SARS-CoV-2 virus and/or diagnosis of COVID-19 infection under section 564(b)(1) of the Act, 21 U.S.C. [id number]) (1), unless the authorization is terminated or revoked sooner. When diagnostic testing is negative, the possibility of a false negative result should be considered in the context of a patient's recent exposures and the presence of clinical signs and symptoms consistent with COVID-19. An individual without symptoms of COVID- 19 and who is not shedding SARS-CoV-2 vi rus would expect to have a negative (not detected) result in this assay. Performed At: [facility] [address] [place], [place] [id number] Rush Farmer MD [id number]    [place]  Final    Comment: Performed at [facility], [place] [address], [place], [place] [address]  SARS CORONAVIRUS 2 (TAT 6-24 HRS)  Nasopharyngeal Nasopharyngeal Swab     Status: None   Collection Time: 08/29/19  1:49 AM   Specimen: Nasopharyngeal Swab  Result Value Ref Range Status   SARS Coronavirus 2 NEGATIVE NEGATIVE Final    Comment: (NOTE) SARS-CoV-2 target nucleic acids are NOT DETECTED. The SARS-CoV-2 RNA is generally detectable in upper and lower respiratory specimens during the acute phase of infection. Negative results do not preclude SARS-CoV-2 infection, do not rule out co-infections with other pathogens, and should not be used as the sole basis for  treatment or other patient management decisions. Negative results must be combined with clinical observations, patient history, and epidemiological information. The expected result is Negative. Fact Sheet for Patients: SugarRoll.be Fact Sheet for Healthcare Providers: https://www.woods-mathews.com/ This test is not yet approved or cleared by the [place] FDA and  has been authorized for detection and/or diagnosis of SARS-CoV-2 by FDA under an Emergency Use Authorization (EUA). This EUA will remain  in effect (meaning this test can be used) for the duration of the COVID-19 declaration under Section 56 4(b)(1) of the Act, 21 U.S.C. section 360bbb-3(b)(1), unless the authorization is terminated or revoked sooner. Performed at [facility], [place] [address] [place], [place] [address]      Labs: BNP (last 3 results) No results for input(s): BNP in the last 8760 hours. Basic Metabolic Panel: Recent Labs  Lab 08/28/19 1956 08/29/19 0555 08/30/19 0942  NA 138 137 139  K 4.6 4.2 4.4  CL 106 105 106  CO2 26 24 25   GLUCOSE 102* 112* 132*  BUN 17 18 15   CREATININE 1.37* 1.23* 1.17*  CALCIUM 9.2 8.9 9.1   Liver Function Tests: Recent Labs  Lab 08/29/19 0555  AST 20  ALT 18  ALKPHOS 76  BILITOT 0.3  PROT 6.7  ALBUMIN 3.5   No results for input(s): LIPASE, AMYLASE in the last 168 hours. No  results for input(s): AMMONIA in the last 168 hours. CBC: Recent Labs  Lab 08/28/19 1956 08/29/19 1531 08/30/19 0409  WBC 8.2 7.5 6.8  HGB 11.0* 11.5* 10.3*  HCT 34.8* 37.8 32.5*  MCV 86.8 86.1 85.3  PLT 238 229 216   Cardiac Enzymes: No results for input(s): CKTOTAL, CKMB, CKMBINDEX, TROPONINI in the last 168 hours. BNP: Invalid input(s): POCBNP CBG: Recent Labs  Lab 08/30/19 1708 08/30/19 2001 08/31/19 0018 08/31/19 0343 08/31/19 0722  GLUCAP 89 130* 106* 98 101*   D-Dimer No results for input(s): DDIMER in the last 72 hours. Hgb A1c No results for input(s): HGBA1C in the last 72 hours. Lipid Profile No results for input(s): CHOL, HDL, LDLCALC, TRIG, CHOLHDL, LDLDIRECT in the last 72 hours. Thyroid function studies No results for input(s): TSH, T4TOTAL, T3FREE, THYROIDAB in the last 72 hours.  Invalid input(s): FREET3 Anemia work up No results for input(s): VITAMINB12, FOLATE, FERRITIN, TIBC, IRON, RETICCTPCT in the last 72 hours. Urinalysis    Component Value Date/Time   COLORURINE YELLOW 01/14/2014 1224   APPEARANCEUR CLEAR 01/14/2014 1224   LABSPEC 1.025 05/08/2019 1305   PHURINE 6.0 05/08/2019 1305   GLUCOSEU NEGATIVE 05/08/2019 1305   GLUCOSEU NEGATIVE 01/14/2014 1224   HGBUR NEGATIVE 05/08/2019 1305   [place] 05/08/2019 1305   [place] 05/08/2019 1305   PROTEINUR 30 (A) 05/08/2019 1305   UROBILINOGEN 0.2 05/08/2019 1305   NITRITE NEGATIVE 05/08/2019 1305   LEUKOCYTESUR TRACE (A) 05/08/2019 1305   Sepsis Labs Invalid input(s): PROCALCITONIN,  WBC,  LACTICIDVEN Microbiology Recent Results (from the past 240 hour(s))  Novel Coronavirus, NAA (Hosp order, Send-out to Ref Lab; TAT 18-24 hrs     Status: None   Collection Time: 08/28/19 11:27 PM   Specimen: Nasopharyngeal Swab; Respiratory  Result Value Ref Range Status   SARS-CoV-2, NAA NOT DETECTED NOT DETECTED Final    Comment: (NOTE) This nucleic acid amplification test was  developed and its performance characteristics determined by [employer] and [employer]. Nucleic acid amplification tests include PCR and TMA. This test has not been FDA cleared or approved. This test has been authorized  by FDA under an Emergency Use Authorization (EUA). This test is only authorized for the duration of time the declaration that circumstances exist justifying the authorization of the emergency use of in vitro diagnostic tests for detection of SARS-CoV-2 virus and/or diagnosis of COVID-19 infection under section 564(b)(1) of the Act, 21 U.S.C. [id number]) (1), unless the authorization is terminated or revoked sooner. When diagnostic testing is negative, the possibility of a false negative result should be considered in the context of a patient's recent exposures and the presence of clinical signs and symptoms consistent with COVID-19. An individual without symptoms of COVID- 19 and who is not shedding SARS-CoV-2 vi rus would expect to have a negative (not detected) result in this assay. Performed At: [facility] [address] [place], [place] [id number] Rush Farmer MD [id number]    [place]  Final    Comment: Performed at [facility], [place] [address] [place], [place] [address]  SARS CORONAVIRUS 2 (TAT 6-24 HRS) Nasopharyngeal Nasopharyngeal Swab     Status: None   Collection Time: 08/29/19  1:49 AM   Specimen: Nasopharyngeal Swab  Result Value Ref Range Status   SARS Coronavirus 2 NEGATIVE NEGATIVE Final    Comment: (NOTE) SARS-CoV-2 target nucleic acids are NOT DETECTED. The SARS-CoV-2 RNA is generally detectable in upper and lower respiratory specimens during the acute phase of infection. Negative results do not preclude SARS-CoV-2 infection, do not rule out co-infections with other pathogens, and should not be used as the sole basis for treatment or other patient management decisions. Negative results must be combined with  clinical observations, patient history, and epidemiological information. The expected result is Negative. Fact Sheet for Patients: SugarRoll.be Fact Sheet for Healthcare Providers: https://www.woods-mathews.com/ This test is not yet approved or cleared by the [place] FDA and  has been authorized for detection and/or diagnosis of SARS-CoV-2 by FDA under an Emergency Use Authorization (EUA). This EUA will remain  in effect (meaning this test can be used) for the duration of the COVID-19 declaration under Section 56 4(b)(1) of the Act, 21 U.S.C. section 360bbb-3(b)(1), unless the authorization is terminated or revoked sooner. Performed at [facility], [place] [address], [place], [place] [address]      Time coordinating discharge: 32 minutes  SIGNED:   Hosie Poisson, MD  [facility] 09/02/2019, 9:00 AM Pager   If 7PM-7AM, please contact night-coverage [email or web address] Password [email or web address]

## 2019-09-03 ENCOUNTER — Telehealth: Payer: Self-pay

## 2019-09-03 DIAGNOSIS — E2601 Conn's syndrome: Secondary | ICD-10-CM | POA: Diagnosis not present

## 2019-09-03 DIAGNOSIS — I10 Essential (primary) hypertension: Secondary | ICD-10-CM | POA: Diagnosis not present

## 2019-09-03 DIAGNOSIS — E039 Hypothyroidism, unspecified: Secondary | ICD-10-CM | POA: Diagnosis not present

## 2019-09-03 NOTE — Telephone Encounter (Signed)
noted 

## 2019-09-03 NOTE — Telephone Encounter (Signed)
Spoke with patient.  She wants to cancel her hospital follow up with Webb Silversmith, NP.  Patient says she is finding a new doctor and does not wish to discuss further.    She has no questions or concerns at this point and does not wish to discuss further.    Phone call ended, appt cancelled and R. Baity, NP removed as PCP.

## 2019-09-06 ENCOUNTER — Ambulatory Visit: Payer: Medicare HMO | Admitting: Internal Medicine

## 2019-09-07 ENCOUNTER — Encounter: Payer: Self-pay | Admitting: Family Medicine

## 2019-09-07 ENCOUNTER — Ambulatory Visit (INDEPENDENT_AMBULATORY_CARE_PROVIDER_SITE_OTHER): Payer: Medicare HMO | Admitting: Family Medicine

## 2019-09-07 ENCOUNTER — Other Ambulatory Visit: Payer: Self-pay

## 2019-09-07 DIAGNOSIS — I10 Essential (primary) hypertension: Secondary | ICD-10-CM | POA: Diagnosis not present

## 2019-09-07 DIAGNOSIS — E119 Type 2 diabetes mellitus without complications: Secondary | ICD-10-CM

## 2019-09-07 DIAGNOSIS — N1831 Chronic kidney disease, stage 3a: Secondary | ICD-10-CM

## 2019-09-07 DIAGNOSIS — G4733 Obstructive sleep apnea (adult) (pediatric): Secondary | ICD-10-CM

## 2019-09-07 NOTE — Progress Notes (Signed)
Virtual Visit via Video Note  I connected with Maria Franklin on 09/07/19 at 10:30 AM EDT by a video enabled telemedicine application 2/2 [id number] pandemic and verified that I am speaking with the correct person using two identifiers.  Location patient: home Location provider:work or home office Persons participating in the virtual visit: patient, provider  I discussed the limitations of evaluation and management by telemedicine and the availability of in person appointments. The patient expressed understanding and agreed to proceed.   HPI: Pt is a 58 yo female with pmh sig for HTN, DM II, obesity, CKD 3, HLD, seasonal allergies, paranoid schizophrenia, hyperaldosteronism.  Pt previously seen by Webb Silversmith, NP.    HTN: -checks at home.   -was 150/85  -Adrenal gland removed Aug 5th.  Was taking off all meds except coreg. -after her surgery, her pcp d/c'd the coreg and she restarted lisinopril. -pt noted elevated bp , back pain, and CP.  She ended up in the [facility] 9/22, then admitted at [facility] 9/22-9/25. -pt is now taking lisinopril 20 mg, hydralazine 25 mg BID, and coreg 25 mg BID since d/c. -last night bp 140/90s.  Pt walked and went up the stairs for exercise. -bp this am 116/62 -pt eating baked chicken, vegetables, cheerios, coffee -pt expresses frustration  OSA: -not using CPAP -states doesn't need it since having polyps removed from her sinuses -pt also states she needs new tubing, but was told she had to have another sleep study first.  DM II: -followed by Endocrinoglogy -fsbs 109  CKD III: -pt states she was not aware of this until recently   ROS: See pertinent positives and negatives per HPI.  Past Medical History:  Diagnosis Date  . Allergy   . Anemia   . Angio-edema   . Asthma    ALL THE TIME   LAST FLARE UP 12/2018  . Chest pain 08/2019  . Chicken pox   . Diabetes mellitus without complication ([place])   . GERD (gastroesophageal reflux disease)   . Hyperlipidemia    . Hypertension   . Hyperthyroidism   . Recurrent upper respiratory infection (URI)   . Schizophrenia ([place])   . Sleep apnea    DX 2-3 YRS AGO.  Marland Kitchen Thyroid disease   . Urticaria     Past Surgical History:  Procedure Laterality Date  . left adrenal gland removal  07/2019  . SINOSCOPY    . SINUS ENDO W/FUSION Bilateral 01/05/2019   Procedure: ENDOSCOPIC SINUS SURGERY WITH NAVIGATION;  Surgeon: Jerrell Belfast, MD;  Location: [facility];  Service: ENT;  Laterality: Bilateral;  . SINUS IRRIGATION    . TONSILLECTOMY    . TURBINATE REDUCTION N/A 01/05/2019   Procedure: Turbinate Reduction;  Surgeon: Jerrell Belfast, MD;  Location: [facility] OR;  Service: ENT;  Laterality: N/A;    Family History  Problem Relation Age of Onset  . Mental illness Mother   . Hypertension Mother   . Schizophrenia Mother   . Depression Mother   . Cancer Father        LUNG  . Cancer Sister        BREAST  . Breast cancer Sister   . Mental illness Brother   . Hypertension Brother   . Schizophrenia Brother   . Depression Brother   . Heart disease Maternal Grandmother   . Depression Maternal Grandmother   . Schizophrenia Maternal Grandmother   . Schizophrenia Brother   . Schizophrenia Maternal Uncle   . Breast cancer Maternal Aunt   .  Heart attack Sister   . Heart attack Sister   . Heart attack Sister     Current Outpatient Medications:  .  albuterol (PROVENTIL) (2.5 MG/3ML) 0.083% nebulizer solution, Take 3 mLs (2.5 mg total) by nebulization every 4 (four) hours as needed for wheezing or shortness of breath., Disp: 75 mL, Rfl: 11 .  albuterol (VENTOLIN HFA) 108 (90 Base) MCG/ACT inhaler, INHALE ONE TO TWO PUFFS INTO LUNGS EVERY 6 HOURS AS NEEDED FOR WHEEZING OR SHORTNESS OF BREATH, Disp: 18 each, Rfl: 1 .  aspirin EC 81 MG EC tablet, Take 1 tablet (81 mg total) by mouth daily., Disp: 30 tablet, Rfl: 0 .  atorvastatin (LIPITOR) 40 MG tablet, Take 1 tablet (40 mg total) by mouth daily at 6 PM., Disp: 30 tablet,  Rfl: 1 .  budesonide-formoterol (SYMBICORT) 160-4.5 MCG/ACT inhaler, Inhale 2 puffs into the lungs 2 (two) times daily., Disp: 1 Inhaler, Rfl: 5 .  carvedilol (COREG) 25 MG tablet, Take 1 tablet (25 mg total) by mouth 2 (two) times daily with a meal., Disp: 60 tablet, Rfl: 0 .  dupilumab (DUPIXENT) 200 MG/1.14ML prefilled syringe, Inject 200 mg into the skin every 14 (fourteen) days., Disp: , Rfl:  .  ferrous sulfate 325 (65 FE) MG tablet, Take 1 tablet (325 mg total) by mouth daily with breakfast., Disp: 30 tablet, Rfl: 2 .  fluticasone (FLONASE) 50 MCG/ACT nasal spray, Place 2 sprays into both nostrils 2 (two) times a day., Disp: 16 g, Rfl: 5 .  haloperidol (HALDOL) 1 MG tablet, Take 1 tablet (1 mg total) by mouth at bedtime., Disp: 90 tablet, Rfl: 1 .  hydrALAZINE (APRESOLINE) 10 MG tablet, Take 2.5 tablets (25 mg total) by mouth 2 (two) times daily., Disp: 60 tablet, Rfl: 1 .  levothyroxine (SYNTHROID, LEVOTHROID) 50 MCG tablet, Take 1 tablet (50 mcg total) by mouth daily before breakfast. MUST SCHEDULE ANNUAL PHYSICAL, Disp: 90 tablet, Rfl: 0 .  lisinopril (ZESTRIL) 20 MG tablet, Take 1 tablet (20 mg total) by mouth daily., Disp: 90 tablet, Rfl: 3 .  metFORMIN (GLUCOPHAGE) 500 MG tablet, Take 500 mg by mouth 2 (two) times daily with a meal. , Disp: , Rfl:  .  methocarbamol (ROBAXIN) 500 MG tablet, Take 1 tablet (500 mg total) by mouth every 8 (eight) hours as needed for muscle spasms., Disp: 10 tablet, Rfl: 0 .  montelukast (SINGULAIR) 10 MG tablet, Take 1 tablet (10 mg total) by mouth daily. MUST SCHEDULE PHYSICAL EXAM (Patient not taking: Reported on 08/29/2019), Disp: 90 tablet, Rfl: 0 .  Omega-3 Fatty Acids (FISH OIL PO), Take 1 capsule by mouth daily., Disp: , Rfl:  .  omeprazole (PRILOSEC) 40 MG capsule, TAKE 1 CAPSULE DAILY (Patient taking differently: Take 40 mg by mouth daily. ), Disp: 90 capsule, Rfl: 0  EXAM:  VITALS per patient if applicable: BP 123XX123.  RR between 12-20  bpm  GENERAL: alert, oriented, appears well and in no acute distress.    HEENT: atraumatic, conjunctiva clear, no obvious abnormalities on inspection of external nose and ears  NECK: normal movements of the head and neck  LUNGS: on inspection no signs of respiratory distress, breathing rate appears normal, no obvious gross SOB, gasping or wheezing  CV: no obvious cyanosis  MS: moves all visible extremities without noticeable abnormality  PSYCH/NEURO: pleasant and cooperative, no obvious depression or anxiety, speech and thought processing grossly intact  ASSESSMENT AND PLAN:  Discussed the following assessment and plan:  Pt to f/u in the  next few wks to complete TOC as ran out of time this visit.  Unable to answer direct questions without extra details.  Essential hypertension -continue current meds -discussed lifestyle modifcations -continue checking bp at home -will obtain BMP at next OFV.  OSA (obstructive sleep apnea) -pt to f/u with Pulm for repeat sleep study.  Type 2 diabetes mellitus without complication, without long-term current use of insulin (HCC) -continue current meds -last hgb A1C 132  Stage 3a chronic kidney disease -GFR 59, creat 1.17 on 08/30/19 -discussed the importance of staying hydrated. -will obtain repeat BMP at next OFV   F/u prn   I discussed the assessment and treatment plan with the patient. The patient was provided an opportunity to ask questions and all were answered. The patient agreed with the plan and demonstrated an understanding of the instructions.   The patient was advised to call back or seek an in-person evaluation if the symptoms worsen or if the condition fails to improve as anticipated.  I provided 23 minutes of non-face-to-face time during this encounter.   Billie Ruddy, MD

## 2019-09-11 DIAGNOSIS — I10 Essential (primary) hypertension: Secondary | ICD-10-CM | POA: Diagnosis not present

## 2019-09-11 DIAGNOSIS — R7309 Other abnormal glucose: Secondary | ICD-10-CM | POA: Diagnosis not present

## 2019-09-11 DIAGNOSIS — Z6841 Body Mass Index (BMI) 40.0 and over, adult: Secondary | ICD-10-CM | POA: Diagnosis not present

## 2019-09-11 DIAGNOSIS — E2601 Conn's syndrome: Secondary | ICD-10-CM | POA: Diagnosis not present

## 2019-09-11 DIAGNOSIS — E039 Hypothyroidism, unspecified: Secondary | ICD-10-CM | POA: Diagnosis not present

## 2019-09-13 ENCOUNTER — Other Ambulatory Visit: Payer: Self-pay

## 2019-09-13 ENCOUNTER — Ambulatory Visit (INDEPENDENT_AMBULATORY_CARE_PROVIDER_SITE_OTHER): Payer: Medicare HMO | Admitting: Family Medicine

## 2019-09-13 ENCOUNTER — Encounter: Payer: Self-pay | Admitting: Family Medicine

## 2019-09-13 VITALS — BP 142/92 | HR 76 | Temp 97.8°F | Wt 252.0 lb

## 2019-09-13 DIAGNOSIS — M47814 Spondylosis without myelopathy or radiculopathy, thoracic region: Secondary | ICD-10-CM | POA: Diagnosis not present

## 2019-09-13 DIAGNOSIS — I10 Essential (primary) hypertension: Secondary | ICD-10-CM | POA: Diagnosis not present

## 2019-09-13 DIAGNOSIS — J302 Other seasonal allergic rhinitis: Secondary | ICD-10-CM | POA: Diagnosis not present

## 2019-09-13 DIAGNOSIS — E041 Nontoxic single thyroid nodule: Secondary | ICD-10-CM

## 2019-09-13 MED ORDER — HYDRALAZINE HCL 25 MG PO TABS: 25.0000 mg | ORAL_TABLET | Freq: Three times a day (TID) | ORAL | 2 refills | Status: DC

## 2019-09-13 MED ORDER — ALBUTEROL SULFATE HFA 108 (90 BASE) MCG/ACT IN AERS: INHALATION_SPRAY | RESPIRATORY_TRACT | 5 refills | Status: DC

## 2019-09-13 NOTE — Patient Instructions (Addendum)
We are increasing her hydralazine to 25 mg 3 times a day.  Continue taking Coreg 25 mg twice a day and Spironolactone 50 mg daily.  You should continue to check your blood pressure and keep a log of the readings to bring with you to the next appointment. 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 Referral to physical therapy has been placed in regards to the pain in your back.  You will be called about scheduling this appointment.   Managing Your Hypertension Hypertension is commonly called high blood pressure. This is when the force of your blood pressing against the walls of your arteries is too strong. Arteries are blood vessels that carry blood from your heart throughout your body. Hypertension forces the heart to work harder to pump blood, and may cause the arteries to become narrow or stiff. Having untreated or uncontrolled hypertension can cause heart attack, stroke, kidney disease, and other problems. What are blood pressure readings? A blood pressure reading consists of a higher number over a lower number. Ideally, your blood pressure should be below 120/80. The first ("top") number is called the systolic pressure. It is a measure of the pressure in your arteries as your heart beats. The second ("bottom") number is called the diastolic pressure. It is a measure of the pressure in your arteries as the heart relaxes. What does my blood pressure reading mean? Blood pressure is classified into four stages. Based on your blood pressure reading, your health care provider may use the following stages to determine what type of treatment you need, if any. Systolic pressure and diastolic pressure are measured in a unit called mm Hg. Normal  Systolic pressure: below 123456.  Diastolic pressure: below 80. Elevated  Systolic pressure: [id number].  Diastolic  pressure: below 80. Hypertension stage 1  Systolic pressure: 0000000.  Diastolic pressure: [id number]. Hypertension stage 2  Systolic pressure: [id number] or above.  Diastolic pressure: 90 or above. What health risks are associated with hypertension? Managing your hypertension is an important responsibility. Uncontrolled hypertension can lead to:  A heart attack.  A stroke.  A weakened blood vessel (aneurysm).  Heart failure.  Kidney damage.  Eye damage.  Metabolic syndrome.  Memory and concentration problems. What changes can I make to manage my hypertension? Hypertension can be managed by making lifestyle changes and possibly by taking medicines. Your health care provider will help you make a plan to bring your blood pressure within a normal range. Eating and drinking   Eat a diet that is high in fiber and potassium, and low in salt (sodium), added sugar, and fat. An example eating plan is called the DASH (Dietary Approaches to Stop Hypertension) diet. To eat this way: ? Eat plenty of fresh fruits and vegetables. Try to fill half of your plate at each meal with fruits and vegetables. ? Eat whole grains, such as whole wheat pasta, brown rice, or whole grain bread. Fill about one quarter of your plate with whole grains. ? Eat low-fat diary products. ? Avoid fatty cuts of meat, processed or cured meats, and poultry with skin. Fill about one quarter of your plate with lean proteins such as fish, chicken without skin, beans, eggs, and tofu. ? Avoid premade and processed foods. These tend to be higher in sodium, added sugar, and fat.  Reduce your daily sodium intake.  Most people with hypertension should eat less than 1,500 mg of sodium a day.  Limit alcohol intake to no more than 1 drink a day for nonpregnant women and 2 drinks a day for men. One drink equals 12 oz of beer, 5 oz of wine, or 1 oz of hard liquor. Lifestyle  Work with your health care provider to maintain a healthy body  weight, or to lose weight. Ask what an ideal weight is for you.  Get at least 30 minutes of exercise that causes your heart to beat faster (aerobic exercise) most days of the week. Activities may include walking, swimming, or biking.  Include exercise to strengthen your muscles (resistance exercise), such as weight lifting, as part of your weekly exercise routine. Try to do these types of exercises for 30 minutes at least 3 days a week.  Do not use any products that contain nicotine or tobacco, such as cigarettes and e-cigarettes. If you need help quitting, ask your health care provider.  Control any long-term (chronic) conditions you have, such as high cholesterol or diabetes. Monitoring  Monitor your blood pressure at home as told by your health care provider. Your personal target blood pressure may vary depending on your medical conditions, your age, and other factors.  Have your blood pressure checked regularly, as often as told by your health care provider. Working with your health care provider  Review all the medicines you take with your health care provider because there may be side effects or interactions.  Talk with your health care provider about your diet, exercise habits, and other lifestyle factors that may be contributing to hypertension.  Visit your health care provider regularly. Your health care provider can help you create and adjust your plan for managing hypertension. Will I need medicine to control my blood pressure? Your health care provider may prescribe medicine if lifestyle changes are not enough to get your blood pressure under control, and if:  Your systolic blood pressure is 130 or higher.  Your diastolic blood pressure is 80 or higher. Take medicines only as told by your health care provider. Follow the directions carefully. Blood pressure medicines must be taken as prescribed. The medicine does not work as well when you skip doses. Skipping doses also puts  you at risk for problems. Contact a health care provider if:  You think you are having a reaction to medicines you have taken.  You have repeated (recurrent) headaches.  You feel dizzy.  You have swelling in your ankles.  You have trouble with your vision. Get help right away if:  You develop a severe headache or confusion.  You have unusual weakness or numbness, or you feel faint.  You have severe pain in your chest or abdomen.  You vomit repeatedly.  You have trouble breathing. Summary  Hypertension is when the force of blood pumping through your arteries is too strong. If this condition is not controlled, it may put you at risk for serious complications.  Your personal target blood pressure may vary depending on your medical conditions, your age, and other factors. For most people, a normal blood pressure is less than 120/80.  Hypertension is managed by lifestyle changes, medicines, or both. Lifestyle changes include weight loss, eating a healthy, low-sodium diet, exercising more, and limiting alcohol. This information is not intended to replace advice given to you by your health care provider. Make sure you discuss any questions you have with your health care provider. Document Released: 08/16/2012 Document Revised:  03/16/2019 Document Reviewed: 10/20/2016 Elsevier Patient Education  [place].  Thyroid Nodule  A thyroid nodule is an isolated growth of thyroid cells that forms a lump in your thyroid gland. The thyroid gland is a butterfly-shaped gland. It is found in the lower front of your neck. This gland sends chemical messengers (hormones) through your blood to all parts of your body. These hormones are important in regulating your body temperature and helping your body to use energy. Thyroid nodules are common. Most are not cancerous (benign). You may have one nodule or several nodules. Different types of thyroid nodules include nodules that:  Grow and fill  with fluid (thyroid cysts).  Produce too much thyroid hormone (hot nodules or hyperthyroid).  Produce no thyroid hormone (cold nodules or hypothyroid).  Form from cancer cells (thyroid cancers). What are the causes? In most cases, the cause of this condition is not known. What increases the risk? The following factors may make you more likely to develop this condition.  Age. Thyroid nodules become more common in people who are older than 58 years of age.  Gender. ? Benign thyroid nodules are more common in women. ? Cancerous (malignant) thyroid nodules are more common in men.  A family history that includes: ? Thyroid nodules. ? Pheochromocytoma. ? Thyroid carcinoma. ? Hyperparathyroidism.  Certain kinds of thyroid diseases, such as Hashimoto's thyroiditis.  Lack of iodine in your diet.  A history of head and neck radiation, such as from previous cancer treatment. What are the signs or symptoms? In many cases, there are no symptoms. If you have symptoms, they may include:  A lump in your lower neck.  Feeling a lump or tickle in your throat.  Pain in your neck, jaw, or ear.  Having trouble swallowing. Hot nodules may cause symptoms that include:  Weight loss.  Warm, flushed skin.  Feeling hot.  Feeling nervous.  A racing heartbeat. Cold nodules may cause symptoms that include:  Weight gain.  Dry skin.  Brittle hair. This may also occur with hair loss.  Feeling cold.  Fatigue. Thyroid cancer nodules may cause symptoms that include:  Hard nodules that feel stuck to the thyroid gland.  Hoarseness.  Lumps in the glands near your thyroid (lymph nodes). How is this diagnosed? A thyroid nodule may be felt by your health care provider during a physical exam. This condition may also be diagnosed based on your symptoms. You may also have tests, including:  An ultrasound. This may be done to confirm the diagnosis.  A biopsy. This involves taking a sample  from the nodule and looking at it under a microscope.  Blood tests to make sure that your thyroid is working properly.  A thyroid scan. This test uses a radioactive tracer injected into a vein to create an image of the thyroid gland on a computer screen.  Imaging tests such as MRI or CT scan. These may be done if: ? Your nodule is large. ? Your nodule is blocking your airway. ? Cancer is suspected. How is this treated? Treatment depends on the cause and size of your nodule or nodules. If the nodule is benign, treatment may not be necessary. Your health care provider may monitor the nodule to see if it goes away without treatment. If the nodule continues to grow, is cancerous, or does not go away, treatment may be needed. Treatment may include:  Having a cystic nodule drained with a needle.  Ablation therapy. In this treatment, alcohol is injected  into the area of the nodule to destroy the cells. Ablation with heat (thermal ablation) may also be used.  Radioactive iodine. In this treatment, radioactive iodine is given as a pill or liquid that you drink. This substance causes the thyroid nodule to shrink.  Surgery to remove the nodule. Part or all of your thyroid gland may need to be removed as well.  Medicines. Follow these instructions at home:  Pay attention to any changes in your nodule.  Take over-the-counter and prescription medicines only as told by your health care provider.  Keep all follow-up visits as told by your health care provider. This is important. Contact a health care provider if:  Your voice changes.  You have trouble swallowing.  You have pain in your neck, ear, or jaw that is getting worse.  Your nodule gets bigger.  Your nodule starts to make it harder for you to breathe.  Your muscles look like they are shrinking (muscle wasting). Get help right away if:  You have chest pain.  There is a loss of consciousness.  You have a sudden fever.  You feel  confused.  You are seeing or hearing things that other people do not see or hear (having hallucinations).  You feel very weak.  You have mood swings.  You feel very restless.  You feel suddenly nauseous or throw up.  You suddenly have diarrhea. Summary  A thyroid nodule is an isolated growth of thyroid cells that forms a lump in your thyroid gland.  Thyroid nodules are common. Most are not cancerous (benign). You may have one nodule or several nodules.  Treatment depends on the cause and size of your nodule or nodules. If the nodule is benign, treatment may not be necessary.  Your health care provider may monitor the nodule to see if it goes away without treatment. If the nodule continues to grow, is cancerous, or does not go away, treatment may be needed. This information is not intended to replace advice given to you by your health care provider. Make sure you discuss any questions you have with your health care provider. Document Released: 10/15/2004 Document Revised: 07/07/2018 Document Reviewed: 07/10/2018 Elsevier Patient Education  2020 [employer].

## 2019-09-13 NOTE — Progress Notes (Signed)
Subjective:    Patient ID: Maria Franklin, female    DOB: 01-Aug-1961, 58 y.o.   MRN: [id number]  No chief complaint on file.   HPI Patient was seen today for f/u on ongoing concerns.  HTN: -Patient status post left adrenal gland removal -Currently taking Coreg 25 mg twice daily, hydralazine 25 mg twice daily, spironolactone 50 mg daily -Patient endorses BP being 160/100 -Endorses elevated BP s/p surgery.  Prior to surgery BP was 120-130s/70s -Patient denies headache, chest pain, changes in vision  Back pain: -Patient states this is her main concern -Endorses ongoing back pain x4-6 weeks -Described as a deep pain near/under right scapula. -At times the pain causes pain to radiate down right arm causing weakness in arm. -Patient has tried heat, massage, NSAIDs, rest without relief -Denies injury  Endocrinology concerns: -Patient followed by endocrinology, Dr. Garnet Koyanagi at [facility] -Last TSH 9/28 elevated per review on pt's phone. -Pt taking Synthroid 50 mcg daily.  No adjustments of dose were made.   -Pt notes concerns over use of biotin supplement causing changes in TSH.  Pt d/c'd biotin 1 month ago. -Plans to recheck TSH at the end of the month -Pt s/p left adrenal gland removal for adrenal nodule at [facility] by Dr. Cam Hai.    Past Medical History:  Diagnosis Date  . Allergy   . Anemia   . Angio-edema   . Asthma    ALL THE TIME   LAST FLARE UP 12/2018  . Chest pain 08/2019  . Chicken pox   . Diabetes mellitus without complication ([place])   . GERD (gastroesophageal reflux disease)   . Hyperlipidemia   . Hypertension   . Hyperthyroidism   . Recurrent upper respiratory infection (URI)   . Schizophrenia ([place])   . Sleep apnea    DX 2-3 YRS AGO.  Marland Kitchen Thyroid disease   . Urticaria     Allergies  Allergen Reactions  . Bee Pollen Cough  . Pollen Extract Cough  . Latex Itching, Rash and Other (See Comments)    Severe itching     ROS General: Denies fever, chills, night sweats, changes in weight, changes in appetite HEENT: Denies headaches, ear pain, changes in vision, rhinorrhea, sore throat CV: Denies CP, palpitations, SOB, orthopnea Pulm: Denies SOB, cough, wheezing GI: Denies abdominal pain, nausea, vomiting, diarrhea, constipation GU: Denies dysuria, hematuria, frequency, vaginal discharge Msk: Denies muscle cramps, joint pains  +R sided mid back pain Neuro: Denies weakness, numbness, tingling Skin: Denies rashes, bruising Psych: Denies depression, anxiety, hallucinations      Objective:    Blood pressure (!) 142/92, pulse 76, temperature 97.8 F (36.6 C), temperature source Temporal, weight 252 lb (114.3 kg), last menstrual period 02/02/2017, SpO2 97 %.   Gen. Pleasant, well-nourished, in no distress, normal affect   HEENT: [place]/AT, face symmetric, no scleral icterus, PERRLA, nares patent without drainage Lungs: no accessory muscle use, CTAB, no wheezes or rales Cardiovascular: RRR, no m/r/g, no peripheral edema Abdomen: BS present, soft, NT/ND Musculoskeletal: No TTP of cervical, thoracic, lumbar spine, or paraspinal muscles.  No TTP of right scapula or shoulder.  No edema noted.  No deformities, no cyanosis or clubbing, normal tone Neuro:  A&Ox3, CN II-XII intact, normal gait Skin:  Warm, no lesions/ rash   Wt Readings from Last 3 Encounters:  09/13/19 252 lb (114.3 kg)  08/31/19 250 lb 12.8 oz (113.8 kg)  07/25/19 250 lb (113.4 kg)    Lab Results  Component  Value Date   WBC 6.8 08/30/2019   HGB 10.3 (L) 08/30/2019   HCT 32.5 (L) 08/30/2019   PLT 216 08/30/2019   GLUCOSE 132 (H) 08/30/2019   CHOL 197 07/25/2019   TRIG 112.0 07/25/2019   HDL 68.20 07/25/2019   LDLDIRECT 97.1 01/14/2014   LDLCALC 106 (H) 07/25/2019   ALT 18 08/29/2019   AST 20 08/29/2019   NA 139 08/30/2019   K 4.4 08/30/2019   CL 106 08/30/2019   CREATININE 1.17 (H) 08/30/2019   BUN 15 08/30/2019   CO2 25  08/30/2019   TSH 2.14 07/25/2019   HGBA1C 6.7 (H) 07/25/2019   MICROALBUR 4.2 (H) 07/25/2019    Assessment/Plan:  Essential hypertension -Uncontrolled -BP initially 146/100.  142/92 on recheck -Discussed lifestyle modifications including limiting sodium intake -Renin level obtained 08/2019 normal.  Reviewed on patient's phone.  Done at [facility]. -Continue Coreg 25 mg twice daily Spironolactone 50 mg daily -We will increase hydralazine to 25 mg 3 times daily. -Patient encouraged to continue checking BP daily. -Consider renal ultrasound - Plan: hydrALAZINE (APRESOLINE) 25 MG tablet  Thyroid nodule -Patient advised a 1.1 cm mass in the right lobe of thyroid near it mass noted on CT angio chest from 08/29/2019 -Recent TSH elevated -Discussed follow-up with endocrinology.  Pt has appointment in the next few weeks. -For now continue levothyroxine 50 mcg daily  Osteoarthritis of thoracic spine, unspecified spinal osteoarthritis complication status  -Degenerative changes in thoracic spine noted on CT 08/29/2019 -Discussed possibly contributing to patient's current back pain. -Encouraged to continue supportive therapy -Patient to decide between chiropractor or physical therapist.  She will check with her insurance company to see which is covered - Plan: Ambulatory referral to Physical Therapy  Seasonal allergies  - Plan: albuterol (VENTOLIN HFA) 108 (90 Base) MCG/ACT inhaler   Discussed obtaining records from endocrinology.  We will also review chart further.  F/u PRN in 1 month  Grier Mitts, MD

## 2019-09-14 DIAGNOSIS — J4521 Mild intermittent asthma with (acute) exacerbation: Secondary | ICD-10-CM | POA: Diagnosis not present

## 2019-09-20 ENCOUNTER — Ambulatory Visit: Payer: Medicare HMO | Admitting: Pulmonary Disease

## 2019-09-24 ENCOUNTER — Other Ambulatory Visit: Payer: Self-pay | Admitting: Internal Medicine

## 2019-09-24 DIAGNOSIS — E041 Nontoxic single thyroid nodule: Secondary | ICD-10-CM

## 2019-09-27 ENCOUNTER — Other Ambulatory Visit: Payer: Self-pay

## 2019-09-27 ENCOUNTER — Ambulatory Visit: Payer: Medicare HMO | Admitting: Pulmonary Disease

## 2019-09-27 ENCOUNTER — Encounter: Payer: Self-pay | Admitting: Pulmonary Disease

## 2019-09-27 VITALS — BP 128/70 | HR 94 | Ht 62.0 in | Wt 247.4 lb

## 2019-09-27 DIAGNOSIS — G4733 Obstructive sleep apnea (adult) (pediatric): Secondary | ICD-10-CM

## 2019-09-27 NOTE — Patient Instructions (Signed)
History of obstructive sleep apnea  We will schedule you for home sleep  We will contact you after the studies reviewed  I will see you back in the office in 3 months  Call with significant concerns Sleep Apnea Sleep apnea is a condition in which breathing pauses or becomes shallow during sleep. Episodes of sleep apnea usually last 10 seconds or longer, and they may occur as many as 20 times an hour. Sleep apnea disrupts your sleep and keeps your body from getting the rest that it needs. This condition can increase your risk of certain health problems, including:  Heart attack.  Stroke.  Obesity.  Diabetes.  Heart failure.  Irregular heartbeat. What are the causes? There are three kinds of sleep apnea:  Obstructive sleep apnea. This kind is caused by a blocked or collapsed airway.  Central sleep apnea. This kind happens when the part of the brain that controls breathing does not send the correct signals to the muscles that control breathing.  Mixed sleep apnea. This is a combination of obstructive and central sleep apnea. The most common cause of this condition is a collapsed or blocked airway. An airway can collapse or become blocked if:  Your throat muscles are abnormally relaxed.  Your tongue and tonsils are larger than normal.  You are overweight.  Your airway is smaller than normal. What increases the risk? You are more likely to develop this condition if you:  Are overweight.  Smoke.  Have a smaller than normal airway.  Are elderly.  Are female.  Drink alcohol.  Take sedatives or tranquilizers.  Have a family history of sleep apnea. What are the signs or symptoms? Symptoms of this condition include:  Trouble staying asleep.  Daytime sleepiness and tiredness.  Irritability.  Loud snoring.  Morning headaches.  Trouble concentrating.  Forgetfulness.  Decreased interest in sex.  Unexplained sleepiness.  Mood swings.  Personality  changes.  Feelings of depression.  Waking up often during the night to urinate.  Dry mouth.  Sore throat. How is this diagnosed? This condition may be diagnosed with:  A medical history.  A physical exam.  A series of tests that are done while you are sleeping (sleep study). These tests are usually done in a sleep lab, but they may also be done at home. How is this treated? Treatment for this condition aims to restore normal breathing and to ease symptoms during sleep. It may involve managing health issues that can affect breathing, such as high blood pressure or obesity. Treatment may include:  Sleeping on your side.  Using a decongestant if you have nasal congestion.  Avoiding the use of depressants, including alcohol, sedatives, and narcotics.  Losing weight if you are overweight.  Making changes to your diet.  Quitting smoking.  Using a device to open your airway while you sleep, such as: ? An oral appliance. This is a custom-made mouthpiece that shifts your lower jaw forward. ? A continuous positive airway pressure (CPAP) device. This device blows air through a mask when you breathe out (exhale). ? A nasal expiratory positive airway pressure (EPAP) device. This device has valves that you put into each nostril. ? A bi-level positive airway pressure (BPAP) device. This device blows air through a mask when you breathe in (inhale) and breathe out (exhale).  Having surgery if other treatments do not work. During surgery, excess tissue is removed to create a wider airway. It is important to get treatment for sleep apnea. Without treatment, this  condition can lead to:  High blood pressure.  Coronary artery disease.  In men, an inability to achieve or maintain an erection (impotence).  Reduced thinking abilities. Follow these instructions at home: Lifestyle  Make any lifestyle changes that your health care provider recommends.  Eat a healthy, well-balanced diet.   Take steps to lose weight if you are overweight.  Avoid using depressants, including alcohol, sedatives, and narcotics.  Do not use any products that contain nicotine or tobacco, such as cigarettes, e-cigarettes, and chewing tobacco. If you need help quitting, ask your health care provider. General instructions  Take over-the-counter and prescription medicines only as told by your health care provider.  If you were given a device to open your airway while you sleep, use it only as told by your health care provider.  If you are having surgery, make sure to tell your health care provider you have sleep apnea. You may need to bring your device with you.  Keep all follow-up visits as told by your health care provider. This is important. Contact a health care provider if:  The device that you received to open your airway during sleep is uncomfortable or does not seem to be working.  Your symptoms do not improve.  Your symptoms get worse. Get help right away if:  You develop: ? Chest pain. ? Shortness of breath. ? Discomfort in your back, arms, or stomach.  You have: ? Trouble speaking. ? Weakness on one side of your body. ? Drooping in your face. These symptoms may represent a serious problem that is an emergency. Do not wait to see if the symptoms will go away. Get medical help right away. Call your local emergency services (911 in the [place] Do not drive yourself to the hospital. Summary  Sleep apnea is a condition in which breathing pauses or becomes shallow during sleep.  The most common cause is a collapsed or blocked airway.  The goal of treatment is to restore normal breathing and to ease symptoms during sleep. This information is not intended to replace advice given to you by your health care provider. Make sure you discuss any questions you have with your health care provider. Document Released: 11/12/2002 Document Revised: 09/08/2018 Document Reviewed: 07/18/2018  Elsevier Patient Education  2020 [employer].

## 2019-09-27 NOTE — Progress Notes (Signed)
Maria Franklin    [id number]    02/28/1961  Primary Care Physician:Banks, Langley Adie, MD  Referring Physician: Billie Ruddy, [place],  [place] [address]  Chief complaint:   Patient with a history of obstructive sleep apnea Has not been using CPAP machine  HPI:  Diagnosed with obstructive sleep apnea in 2008 Did use CPAP for few years She had restudy performed about 4 to 5 years later-was restarted on CPAP therapy  She quit using CPAP on 2 occasions secondary to significant nasal stuffiness congestion-she has nasal polyposis  She had recent surgery for nasal polyps in January 2020  She had called the medical supply company for CPAP supplies and was advised that she needed a repeat study  Admits to snoring Admits to some daytime sleepiness Denies dryness of the mouth in the morning Denies headaches No choking episodes No witnessed apneas  No nighttime palpitations or rapid heartbeat    Outpatient Encounter Medications as of 09/27/2019  Medication Sig  . albuterol (VENTOLIN HFA) 108 (90 Base) MCG/ACT inhaler INHALE ONE TO TWO PUFFS INTO LUNGS EVERY 6 HOURS AS NEEDED FOR WHEEZING OR SHORTNESS OF BREATH  . aspirin EC 81 MG EC tablet Take 1 tablet (81 mg total) by mouth daily.  Marland Kitchen atorvastatin (LIPITOR) 40 MG tablet Take 1 tablet (40 mg total) by mouth daily at 6 PM.  . budesonide-formoterol (SYMBICORT) 160-4.5 MCG/ACT inhaler Inhale 2 puffs into the lungs 2 (two) times daily.  . carvedilol (COREG) 25 MG tablet Take 1 tablet (25 mg total) by mouth 2 (two) times daily with a meal.  . fluticasone (FLONASE) 50 MCG/ACT nasal spray Place 2 sprays into both nostrils 2 (two) times a day.  . haloperidol (HALDOL) 1 MG tablet Take 1 tablet (1 mg total) by mouth at bedtime.  . hydrALAZINE (APRESOLINE) 25 MG tablet Take 1 tablet (25 mg total) by mouth 3 (three) times daily.  Marland Kitchen levothyroxine (SYNTHROID, LEVOTHROID) 50 MCG tablet Take 1 tablet (50 mcg  total) by mouth daily before breakfast. MUST SCHEDULE ANNUAL PHYSICAL  . methocarbamol (ROBAXIN) 500 MG tablet Take 1 tablet (500 mg total) by mouth every 8 (eight) hours as needed for muscle spasms.  . Omega-3 Fatty Acids (FISH OIL PO) Take 1 capsule by mouth daily.  Marland Kitchen omeprazole (PRILOSEC) 40 MG capsule TAKE 1 CAPSULE DAILY (Patient taking differently: Take 40 mg by mouth daily. )  . SPIRONOLACTONE PO Take 50 mg by mouth daily.  . [DISCONTINUED] dupilumab (DUPIXENT) 200 MG/1.14ML prefilled syringe Inject 200 mg into the skin every 14 (fourteen) days.  . [DISCONTINUED] ferrous sulfate 325 (65 FE) MG tablet Take 1 tablet (325 mg total) by mouth daily with breakfast.  . [DISCONTINUED] metFORMIN (GLUCOPHAGE) 500 MG tablet Take 500 mg by mouth 2 (two) times daily with a meal.   . [DISCONTINUED] montelukast (SINGULAIR) 10 MG tablet Take 1 tablet (10 mg total) by mouth daily. MUST SCHEDULE PHYSICAL EXAM   No facility-administered encounter medications on file as of 09/27/2019.     Allergies as of 09/27/2019 - Review Complete 09/27/2019  Allergen Reaction Noted  . Bee pollen Cough 03/17/2015  . Pollen extract Cough 03/17/2015  . Latex Itching, Rash, and Other (See Comments) 01/18/2012    Past Medical History:  Diagnosis Date  . Allergy   . Anemia   . Angio-edema   . Asthma    ALL THE TIME   LAST FLARE UP 12/2018  .  Chest pain 08/2019  . Chicken pox   . Diabetes mellitus without complication ([place])   . GERD (gastroesophageal reflux disease)   . Hyperlipidemia   . Hypertension   . Hyperthyroidism   . Recurrent upper respiratory infection (URI)   . Schizophrenia ([place])   . Sleep apnea    DX 2-3 YRS AGO.  Marland Kitchen Thyroid disease   . Urticaria     Past Surgical History:  Procedure Laterality Date  . left adrenal gland removal  07/2019  . SINOSCOPY    . SINUS ENDO W/FUSION Bilateral 01/05/2019   Procedure: ENDOSCOPIC SINUS SURGERY WITH NAVIGATION;  Surgeon: Jerrell Belfast, MD;  Location:  [facility];  Service: ENT;  Laterality: Bilateral;  . SINUS IRRIGATION    . TONSILLECTOMY    . TURBINATE REDUCTION N/A 01/05/2019   Procedure: Turbinate Reduction;  Surgeon: Jerrell Belfast, MD;  Location: [facility] OR;  Service: ENT;  Laterality: N/A;    Family History  Problem Relation Age of Onset  . Mental illness Mother   . Hypertension Mother   . Schizophrenia Mother   . Depression Mother   . Cancer Father        LUNG  . Cancer Sister        BREAST  . Breast cancer Sister   . Mental illness Brother   . Hypertension Brother   . Schizophrenia Brother   . Depression Brother   . Heart disease Maternal Grandmother   . Depression Maternal Grandmother   . Schizophrenia Maternal Grandmother   . Schizophrenia Brother   . Schizophrenia Maternal Uncle   . Breast cancer Maternal Aunt   . Heart attack Sister   . Heart attack Sister   . Heart attack Sister     Social History   Socioeconomic History  . Marital status: Married    Spouse name: Not on file  . Number of children: Not on file  . Years of education: Not on file  . Highest education level: Not on file  Occupational History  . Not on file  Social Needs  . Financial resource strain: Not on file  . Food insecurity    Worry: Not on file    Inability: Not on file  . Transportation needs    Medical: Not on file    Non-medical: Not on file  Tobacco Use  . Smoking status: Former Smoker    Packs/day: 0.25    Years: 6.00    Pack years: 1.50  . Smokeless tobacco: Never Used  . Tobacco comment: QUIT IN HER EARLY 20'S  Substance and Sexual Activity  . Alcohol use: No    Alcohol/week: 0.0 standard drinks  . Drug use: No  . Sexual activity: Not Currently  Lifestyle  . Physical activity    Days per week: Not on file    Minutes per session: Not on file  . Stress: Not on file  Relationships  . Social Herbalist on phone: Not on file    Gets together: Not on file    Attends religious service: Not on file    Active  member of club or organization: Not on file    Attends meetings of clubs or organizations: Not on file    Relationship status: Not on file  . Intimate partner violence    Fear of current or ex partner: Not on file    Emotionally abused: Not on file    Physically abused: Not on file    Forced sexual activity:  Not on file  Other Topics Concern  . 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  HENT: Negative.   Respiratory: Positive for apnea.   Cardiovascular: Negative.   Gastrointestinal: Negative.   Psychiatric/Behavioral: Positive for sleep disturbance.    Vitals:   09/27/19 1007  BP: 128/70  Pulse: 94  SpO2: 99%     Physical Exam  Constitutional: She appears well-developed and well-nourished. No distress.  HENT:  Head: Normocephalic and atraumatic.  Mallampati 2, crowded oropharynx  Eyes: Pupils are equal, round, and reactive to light. Right eye exhibits no discharge. Left eye exhibits no discharge.  Neck: Normal range of motion. Neck supple. No tracheal deviation present. No thyromegaly present.  Cardiovascular: Normal rate and regular rhythm.  No murmur heard. Pulmonary/Chest: [place] normal and breath sounds normal. No respiratory distress. She has no wheezes. She has no rales.  Abdominal: Soft. She exhibits no distension.  Musculoskeletal: Normal range of motion.        General: No edema.  Neurological: She is alert. No cranial nerve deficit.  Skin: Skin is warm and dry. She is not diaphoretic. No erythema.  Psychiatric: She has a normal mood and affect.   Results of the Epworth flowsheet 09/27/2019  Sitting and reading 1  Watching TV 1  Sitting, inactive in a public place (e.g. a theatre or a  meeting) 0  As a passenger in a car for an hour without a break 3  Lying down to rest in the afternoon when circumstances permit 3  Sitting and talking to someone 0  Sitting quietly after a lunch without alcohol 1  In a car, while stopped for a few minutes in traffic 0  Total score 9   Data Reviewed: Records from Dr. Lonell Grandchild office reviewed Previous compliance reviewed  Assessment:  Obstructive sleep apnea .  Has not been on CPAP recently .  Continues to have symptoms suggesting presence of sleep disordered breathing  Obesity  Excessive daytime sleepiness  Plan/Recommendations: We will schedule patient for home sleep study  Treatment options discussed with the patient  Importance of weight management exercise and diet discussed with the patient  I will see him back in the office in about 3 months  Encouraged to call with any significant concerns   Sherrilyn Rist MD [facility] 09/27/2019, 10:29 AM  CC: Billie Ruddy, MD

## 2019-09-28 ENCOUNTER — Ambulatory Visit
Admission: RE | Admit: 2019-09-28 | Discharge: 2019-09-28 | Disposition: A | Discharge status: Home / Self Care | Payer: Medicare HMO | Source: Ambulatory Visit | Attending: Internal Medicine | Admitting: Internal Medicine

## 2019-09-28 DIAGNOSIS — E041 Nontoxic single thyroid nodule: Secondary | ICD-10-CM | POA: Diagnosis not present

## 2019-10-02 ENCOUNTER — Ambulatory Visit: Payer: Medicare HMO | Admitting: Physical Therapy

## 2019-10-02 DIAGNOSIS — I10 Essential (primary) hypertension: Secondary | ICD-10-CM | POA: Diagnosis not present

## 2019-10-03 ENCOUNTER — Telehealth (INDEPENDENT_AMBULATORY_CARE_PROVIDER_SITE_OTHER): Payer: Medicare HMO | Admitting: Family Medicine

## 2019-10-03 DIAGNOSIS — K219 Gastro-esophageal reflux disease without esophagitis: Secondary | ICD-10-CM | POA: Diagnosis not present

## 2019-10-03 DIAGNOSIS — E041 Nontoxic single thyroid nodule: Secondary | ICD-10-CM

## 2019-10-03 DIAGNOSIS — E782 Mixed hyperlipidemia: Secondary | ICD-10-CM

## 2019-10-03 DIAGNOSIS — I10 Essential (primary) hypertension: Secondary | ICD-10-CM | POA: Diagnosis not present

## 2019-10-03 MED ORDER — OMEPRAZOLE 40 MG PO CPDR: 40.0000 mg | DELAYED_RELEASE_CAPSULE | Freq: Every day | ORAL | 2 refills | Status: DC

## 2019-10-03 MED ORDER — CARVEDILOL 25 MG PO TABS: 25.0000 mg | ORAL_TABLET | Freq: Two times a day (BID) | ORAL | 3 refills | Status: DC

## 2019-10-03 MED ORDER — ATORVASTATIN CALCIUM 40 MG PO TABS: 40.0000 mg | ORAL_TABLET | Freq: Every day | ORAL | 3 refills | Status: DC

## 2019-10-03 NOTE — Progress Notes (Signed)
Virtual Visit via Telephone Note  I connected with Maria Franklin on 10/03/19 at  1:00 PM EDT by telephone and verified that I am speaking with the correct person using two identifiers.   I discussed the limitations, risks, security and privacy concerns of performing an evaluation and management service by telephone and the availability of in person appointments. I also discussed with the patient that there may be a patient responsible charge related to this service. The patient expressed understanding and agreed to proceed.  Location patient: home Location provider: work or home office Participants present for the call: patient, provider Patient did not have a visit in the prior 7 days to address this/these issue(s).   History of Present Illness: Pt states bp has improved.  Pt checking bp 136/83, 168/92, 118/72, 99/77.  Pt states she couldn't walk when it was 99/77, felt like she was going to pass out.  She states ate some salt to get it back up.  States "had plenty of water".  Did a 2 day fast during this episode of low bp.  Now taking Spironolactone 50 mg, Coreg 25 mg BID, hydralazine 25 mg TID.  Was 128/70 at pulmonology visit.  Needs to have sleep study.  Pt cancelled appt for PT as her back pain has since resolved.  Back was hurting x 6 wks.  Pt questions if Dupixent was the cause of her back pain.  Seen by Allergy and asthma, Dr. Ernst Bowler.  She was taking it in January then stopped it after COVID.  During that time she had side pain.  She restarted it in August, but has since stopped it at the beginning of Oct.    Pt had f/u with the Endocrinologist.  Had f/u on her thyroid nodule. Does not know the results yet.  Needs refill on coreg, Atorvastatin, and lipitor.  Denies myalgias.   Observations/Objective: Patient sounds cheerful and well on the phone. I do not appreciate any SOB. Speech and thought processing are grossly intact. Patient reported vitals:  Assessment and  Plan: Essential hypertension  -improving -continue lifestyle modifications -Continue current medications including Coreg 25 mg twice daily, hydralazine 25 mg 3 times daily, spironolactone 50 mg daily. -Patient encouraged to check BP at home daily and keep a log.  Patient advised to contact clinic for continued hypotension/symptomatic low BP readings. - Plan: carvedilol (COREG) 25 MG tablet  Gastroesophageal reflux disease  -Avoid foods known to cause problems - Plan: omeprazole (PRILOSEC) 40 MG capsule  Mixed hyperlipidemia  - Plan: atorvastatin (LIPITOR) 40 MG tablet  Thyroid nodule -Continue follow-up with endocrinology -Patient awaiting results from recent thyroid ultrasound.   Follow Up Instructions: F/u in 1-2 months for HTN  I did not refer this patient for an OV in the next 24 hours for this/these issue(s).  I discussed the assessment and treatment plan with the patient. The patient was provided an opportunity to ask questions and all were answered. The patient agreed with the plan and demonstrated an understanding of the instructions.   The patient was advised to call back or seek an in-person evaluation if the symptoms worsen or if the condition fails to improve as anticipated.  I provided 19 minutes of non-face-to-face time during this encounter.   Billie Ruddy, MD

## 2019-10-03 NOTE — Addendum Note (Signed)
Addended by: Grier Mitts R on: 10/03/2019 01:52 PM   Modules accepted: Level of Service

## 2019-10-04 ENCOUNTER — Ambulatory Visit: Payer: Self-pay | Admitting: Allergy & Immunology

## 2019-10-09 ENCOUNTER — Other Ambulatory Visit: Payer: Self-pay | Admitting: Family Medicine

## 2019-10-09 ENCOUNTER — Telehealth: Payer: Self-pay | Admitting: Pulmonary Disease

## 2019-10-09 DIAGNOSIS — E2601 Conn's syndrome: Secondary | ICD-10-CM | POA: Diagnosis not present

## 2019-10-09 DIAGNOSIS — Z6841 Body Mass Index (BMI) 40.0 and over, adult: Secondary | ICD-10-CM | POA: Diagnosis not present

## 2019-10-09 DIAGNOSIS — E039 Hypothyroidism, unspecified: Secondary | ICD-10-CM | POA: Diagnosis not present

## 2019-10-09 DIAGNOSIS — Z1231 Encounter for screening mammogram for malignant neoplasm of breast: Secondary | ICD-10-CM

## 2019-10-09 DIAGNOSIS — R7309 Other abnormal glucose: Secondary | ICD-10-CM | POA: Diagnosis not present

## 2019-10-09 DIAGNOSIS — I10 Essential (primary) hypertension: Secondary | ICD-10-CM | POA: Diagnosis not present

## 2019-10-09 DIAGNOSIS — E042 Nontoxic multinodular goiter: Secondary | ICD-10-CM | POA: Diagnosis not present

## 2019-10-09 NOTE — Telephone Encounter (Signed)
Dr. Ander Slade had ordered a home sleep test on 10/22 office visit. PCC's please advise on status of scheduling patient for her HST.  Pt has not yet been contacted about this.  Thanks!

## 2019-10-09 NOTE — Telephone Encounter (Signed)
I called pt to schedule hst.  I had to leave her vm to call me back.

## 2019-10-10 NOTE — Telephone Encounter (Signed)
Spoke to pt & scheduled her HST.  Nothing further needed.

## 2019-10-11 ENCOUNTER — Other Ambulatory Visit: Payer: Self-pay

## 2019-10-11 ENCOUNTER — Ambulatory Visit (INDEPENDENT_AMBULATORY_CARE_PROVIDER_SITE_OTHER): Payer: Medicare HMO | Admitting: Allergy & Immunology

## 2019-10-11 ENCOUNTER — Encounter: Payer: Self-pay | Admitting: Allergy & Immunology

## 2019-10-11 DIAGNOSIS — J4540 Moderate persistent asthma, uncomplicated: Secondary | ICD-10-CM | POA: Diagnosis not present

## 2019-10-11 DIAGNOSIS — J339 Nasal polyp, unspecified: Secondary | ICD-10-CM | POA: Diagnosis not present

## 2019-10-11 DIAGNOSIS — J3089 Other allergic rhinitis: Secondary | ICD-10-CM

## 2019-10-11 DIAGNOSIS — J302 Other seasonal allergic rhinitis: Secondary | ICD-10-CM

## 2019-10-11 DIAGNOSIS — K9049 Malabsorption due to intolerance, not elsewhere classified: Secondary | ICD-10-CM | POA: Diagnosis not present

## 2019-10-11 NOTE — Patient Instructions (Addendum)
1. Seasonal and perennial allergic rhinitis - Continue with the Flonase two sprays per nostril daily.  - Continue with Allegra one tablet daily (can take an extra if you have a particularly bad day).  - Continue with Singulair (montelukast) 10mg  daily.   2. Moderate persistent asthma - not well controlled -  - Daily controller medication(s): Symbicort 160/4.47mcg two puffs twice daily with spacer - Prior to physical activity: albuterol 2 puffs 10-15 minutes before physical activity. - Rescue medications: albuterol 4 puffs every 4-6 hours as needed or albuterol nebulizer one vial every 4-6 hours as needed - Asthma control goals:  * Full participation in all desired activities (may need albuterol before activity) * Albuterol use two time or less a week on average (not counting use with activity) * Cough interfering with sleep two time or less a month * Oral steroids no more than once a year * No hospitalizations  3. Nasal polyposis - Adding the Dupixent back on will help with the nasal polyps.  - Continue to follow with ENT.   4. No follow-ups on file. This can be an in-person, a virtual Webex or a telephone follow up visit.   Please inform us of any Emergency Department visits, hospitalizations, or changes in symptoms. Call us before going to the ED for breathing or allergy symptoms since we might be able to fit you in for a sick visit. Feel free to contact us anytime with any questions, problems, or concerns.  It was a pleasure to talk to you today today!  Websites that have reliable patient information: 1. American Academy of Asthma, Allergy, and Immunology: www.aaaai.org 2. Food Allergy Research and Education (FARE): foodallergy.org 3. Mothers of Asthmatics: http://www.asthmacommunitynetwork.org 4. American College of Allergy, Asthma, and Immunology: www.acaai.org  "Like" [place] on Facebook and Instagram for our latest updates!      Make sure you are registered to vote! If you have  moved or changed any of your contact information, you will need to get this updated before voting!  In some cases, you MAY be able to register to vote online: [email or web address]    Voter ID laws are NOT going into effect for the General Election in November 2020! DO NOT let this stop you from exercising your right to vote!   Absentee voting is the SAFEST way to vote during the coronavirus pandemic!   Download and print an absentee ballot request form at [email or web address] or you can scan the QR code below with your smart phone:      More information on absentee ballots can be found here: [email or web address]

## 2019-10-11 NOTE — Progress Notes (Signed)
RE: [name] MRN: [id number] DOB: 1961/08/27 Date of Telemedicine Visit: 10/11/2019  Referring provider: Jearld Fenton, NP Primary care provider: Billie Ruddy, MD  Chief Complaint: Asthma and Allergies   Telemedicine Follow Up Visit via Telephone: I connected with Maria Franklin for a follow up on 10/11/19 by telephone and verified that I am speaking with the correct person using two identifiers.   I discussed the limitations, risks, security and privacy concerns of performing an evaluation and management service by telephone and the availability of in person appointments. I also discussed with the patient that there may be a patient responsible charge related to this service. The patient expressed understanding and agreed to proceed.  Patient is at home. Provider is at the office.  Visit start time: 8:37 AM Visit end time: 8:59 AM Insurance consent/check in by: [employer] consent and medical assistant/nurse: Maria Franklin  History of Present Illness:  She is a 58 y.o. female, who is being followed for moderate persistent asthma as well as seasonal and perennial allergic rhinitis and nasal polyposis. Her previous allergy office visit was in July 2020 with myself.  At the last visit, her allergic rhinitis was controlled with Flonase, Allegra, and Singulair.  For her asthma, she was continued on Symbicort 160/4.5 mcg 2 puffs twice daily with a spacer as well as Dupixent every 2 weeks.  She does have nasal polyposis, which has symptomatically improved with the [place].  In the interim, she was admitted to the hospital for chest pain and hypertensive emergency in September 2020. She went to the PCP for back pain but was found to have an elevated BP. Regardless, she felt that the [place] was leading to the back pain and everything cleared up when she stopped the [place]. Her last dose of Dupixent was probably sometime in September. She has always had a history of blood pressure  issues and in fact she had her adrenal glands to deal with that. [facility]  However, from a breathing perspective she has done well.  Asthma/Respiratory Symptom History: She has been using her rescue inhaler at all. She is using her Symbicort two puffs sparingly, because it is expensive with her insurance. We have tried others but they are essentially the same price. Her rescue inhaler is $50 compared to about quadruple that for her Symbicort. She has not needed any steroids for her breathing at all.   Allergic Rhinitis Symptom History: She has been avoiding a lot of allergens. Her dog passed away and her smyptoms have improved since the dog passed away. Her son no longer lives nearby and she does not have to take care of his dog at all.   Food Allergy Symptom History: She continues to avoid nuts and mushrooms. She has had no accidental ingestions whatsoever. She is not interested in retesting at this time.   She does have some itchiness on her skin. This has just started back over the last week or so. It was doing it a long time before, but it was quiescent for a period of time. She has been using Cerve without a problem.  Otherwise, there have been no changes to her past medical history, surgical history, family history, or social history.  Assessment and Plan:  Maria Franklin is a 58 y.o. female with:  Seasonal and perennial allergic rhinitis(grasses, weeds, trees, indoor and outdoor molds, dust mite, roach)  Moderate persistent asthma without complication   Adverse reaction to Dupixent -  unclear causation  Nasal polyposis- s/p three sinus surgeries (1990s, 2013, 2020)  Adverse food reactions (nuts, mushrooms) - with negative testing the past  Pruritus - controlled with emollients alone  Difficulty affording medications   Maria Franklin presents for a follow-up telephone visit.  She is doing fairly well from an atopic perspective.  She has  not been using her Symbicort on a routine basis due to cost.  However, despite this, she is only using 1-2 albuterol inhalers per year.  She does not remember the last time that she needed prednisone or hospitalization for her breathing.  Therefore, we will discontinue on this path for now.  We can certainly revisit the addition of a different biologic in the future.  We are going to stop her Dupixent, although I have never heard of back pain as a side effect of this.  Regardless, she seems to be doing okay without it anyway.  It seems that the decreased allergen exposure overall has helped to control her symptoms.  We are going to continue with emollients for her itchy skin.  She is not interested in adding a topical steroid.  We will see her in 4 months and see how she is doing at that point.   1. Seasonal and perennial allergic rhinitis - Continue with the Flonase two sprays per nostril daily.  - Continue with Allegra one tablet daily (can take an extra if you have a particularly bad day).  - Continue with Singulair (montelukast) 10mg  daily.   2. Moderate persistent asthma - not well controlled -  - Daily controller medication(s): Symbicort 160/4.71mcg two puffs twice daily with spacer - Prior to physical activity: albuterol 2 puffs 10-15 minutes before physical activity. - Rescue medications: albuterol 4 puffs every 4-6 hours as needed or albuterol nebulizer one vial every 4-6 hours as needed - Asthma control goals:  * Full participation in all desired activities (may need albuterol before activity) * Albuterol use two time or less a week on average (not counting use with activity) * Cough interfering with sleep two time or less a month * Oral steroids no more than once a year * No hospitalizations  3. Nasal polyposis - Adding the Dupixent back on will help with the nasal polyps.  - Continue to follow with ENT.   4. Follow up in four months or earlier if needed. This can be an in-person, a  virtual Webex or a telephone follow up visit.   Which  Diagnostics: None.  Medication List:  Current Outpatient Medications  Medication Sig Dispense Refill  . albuterol (VENTOLIN HFA) 108 (90 Base) MCG/ACT inhaler INHALE ONE TO TWO PUFFS INTO LUNGS EVERY 6 HOURS AS NEEDED FOR WHEEZING OR SHORTNESS OF BREATH 8 g 5  . aspirin EC 81 MG EC tablet Take 1 tablet (81 mg total) by mouth daily. 30 tablet 0  . atorvastatin (LIPITOR) 40 MG tablet Take 1 tablet (40 mg total) by mouth daily at 6 PM. 90 tablet 3  . budesonide-formoterol (SYMBICORT) 160-4.5 MCG/ACT inhaler Inhale 2 puffs into the lungs 2 (two) times daily. 1 Inhaler 5  . carvedilol (COREG) 25 MG tablet Take 1 tablet (25 mg total) by mouth 2 (two) times daily with a meal. 180 tablet 3  . fluticasone (FLONASE) 50 MCG/ACT nasal spray Place 2 sprays into both nostrils 2 (two) times a day. 16 g 5  . haloperidol (HALDOL) 1 MG tablet Take 1 tablet (1 mg total) by mouth at bedtime. 90 tablet 1  .  hydrALAZINE (APRESOLINE) 25 MG tablet Take 1 tablet (25 mg total) by mouth 3 (three) times daily. 90 tablet 2  . levothyroxine (SYNTHROID, LEVOTHROID) 50 MCG tablet Take 1 tablet (50 mcg total) by mouth daily before breakfast. MUST SCHEDULE ANNUAL PHYSICAL 90 tablet 0  . omeprazole (PRILOSEC) 40 MG capsule Take 1 capsule (40 mg total) by mouth daily. 90 capsule 2  . SPIRONOLACTONE PO Take 50 mg by mouth daily.    . Omega-3 Fatty Acids (FISH OIL PO) Take 1 capsule by mouth daily.     No current facility-administered medications for this visit.    Allergies: Allergies  Allergen Reactions  . Bee Pollen Cough  . Pollen Extract Cough  . Latex Itching, Rash and Other (See Comments)    Severe itching   I reviewed her past medical history, social history, family history, and environmental history and no significant changes have been reported from previous visits.  Review of Systems  Constitutional: Negative for activity change, appetite change, chills  and diaphoresis.  HENT: Positive for congestion. Negative for postnasal drip, rhinorrhea, sinus pressure and sore throat.   Eyes: Negative for pain, discharge, redness and itching.  Respiratory: Negative for shortness of breath, wheezing and stridor.   Gastrointestinal: Negative for diarrhea, nausea and vomiting.  Endocrine: Negative for cold intolerance and heat intolerance.  Musculoskeletal: Negative for arthralgias, joint swelling and myalgias.  Skin: Negative for rash.  Allergic/Immunologic: Negative for environmental allergies, food allergies and immunocompromised state.    Objective:  Physical exam not obtained as encounter was done via telephone.   Previous notes and tests were reviewed.  I discussed the assessment and treatment plan with the patient. The patient was provided an opportunity to ask questions and all were answered. The patient agreed with the plan and demonstrated an understanding of the instructions.   The patient was advised to call back or seek an in-person evaluation if the symptoms worsen or if the condition fails to improve as anticipated.  I provided 22 minutes of non-face-to-face time during this encounter.  It was my pleasure to participate in [place] care today. Please feel free to contact me with any questions or concerns.   Sincerely,  Valentina Shaggy, MD

## 2019-10-12 ENCOUNTER — Ambulatory Visit (INDEPENDENT_AMBULATORY_CARE_PROVIDER_SITE_OTHER): Payer: Medicare HMO | Admitting: Psychiatry

## 2019-10-12 ENCOUNTER — Encounter (HOSPITAL_COMMUNITY): Payer: Self-pay | Admitting: Psychiatry

## 2019-10-12 DIAGNOSIS — F200 Paranoid schizophrenia: Secondary | ICD-10-CM | POA: Diagnosis not present

## 2019-10-12 DIAGNOSIS — R69 Illness, unspecified: Secondary | ICD-10-CM | POA: Diagnosis not present

## 2019-10-12 NOTE — Progress Notes (Signed)
Virtual Visit via Telephone Note  I connected with Maria Franklin on 10/12/19 at 11:40 AM EST by telephone and verified that I am speaking with the correct person using two identifiers.   I discussed the limitations, risks, security and privacy concerns of performing an evaluation and management service by telephone and the availability of in person appointments. I also discussed with the patient that there may be a patient responsible charge related to this service. The patient expressed understanding and agreed to proceed.   History of Present Illness: Patient was evaluated by phone session.  She requested earlier appointment as she was feeling overwhelmed.  She admitted recently a lot of stress because of her general physical health.  She had surgery for renal gland and she was hoping that she will come off from blood pressure medication but she was admitted with chest pain and shortness of breath and they have to rule out for heart attack.  She is taking antihypertensive medication and she is concerned about her general health.  She noticed lately having more irritability and paranoia and not trusting family members.  She is also overwhelmed with the elections and followed with the news.  But she is sleeping most of the time but she is scheduled to have a new sleep study and hoping to get CPAP.  She has apnea but not able to get CPAP mask because of Covid.  She is taking Haldol 1 mg which helps most of the time but recent stress due to physical health has caused some increase in paranoia and delusions.  She denies any suicidal thoughts or homicidal thought.  She denies any agitation, anger, hallucination.  She lives with her husband who is very supportive.  Her son lives in [place] and she is in contact with him on a regular basis.  She denies drinking or using any illegal substances.  She used to take a higher dose of Haldol however we cut down as she was complaining of family issues.  Her  memory is improved from the past.    Psychiatric History:Reviewed. H/O paranoia and delusions.H/[facility] overdose and [facility]. Readmitted in 4 months due tononcompliant with medication.Abilifyworked wellbutcould not afford. Noh/o abuse.   Recent Results (from the past 2160 hour(s))  CBC     Status: Abnormal   Collection Time: 07/25/19  3:53 PM  Result Value Ref Range   WBC 9.0 4.0 - 10.5 K/uL   RBC 4.02 3.87 - 5.11 Mil/uL   Platelets 351.0 150.0 - 400.0 K/uL   Hemoglobin 10.6 (L) 12.0 - 15.0 g/dL   HCT 33.0 (L) 36.0 - 46.0 %   MCV 82.2 78.0 - 100.0 fl   MCHC 32.1 30.0 - 36.0 g/dL   RDW 14.3 11.5 - 15.5 %  Comprehensive metabolic panel     Status: Abnormal   Collection Time: 07/25/19  3:53 PM  Result Value Ref Range   Sodium 139 135 - 145 mEq/L   Potassium 4.6 3.5 - 5.1 mEq/L   Chloride 100 96 - 112 mEq/L   CO2 26 19 - 32 mEq/L   Glucose, Bld 96 70 - 99 mg/dL   BUN 19 6 - 23 mg/dL   Creatinine, Ser 1.38 (H) 0.40 - 1.20 mg/dL   Total Bilirubin 0.3 0.2 - 1.2 mg/dL   Alkaline Phosphatase 103 39 - 117 U/L   AST 17 0 - 37 U/L   ALT 17 0 - 35 U/L   Total Protein 7.5 6.0 - 8.3  g/dL   Albumin 4.4 3.5 - 5.2 g/dL   Calcium 9.9 8.4 - 10.5 mg/dL   GFR 47.49 (L) >60.00 mL/min  TSH     Status: None   Collection Time: 07/25/19  3:53 PM  Result Value Ref Range   TSH 2.14 0.35 - 4.50 uIU/mL  T4, free     Status: None   Collection Time: 07/25/19  3:53 PM  Result Value Ref Range   Free T4 1.03 0.60 - 1.60 ng/dL    Comment: Specimens from patients who are undergoing biotin therapy and /or ingesting biotin supplements may contain high levels of biotin.  The higher biotin concentration in these specimens interferes with this Free T4 assay.  Specimens that contain high levels  of biotin may cause false high results for this Free T4 assay.  Please interpret results in light of the total clinical presentation of the patient.    Lipid panel     Status: Abnormal    Collection Time: 07/25/19  3:53 PM  Result Value Ref Range   Cholesterol 197 0 - 200 mg/dL    Comment: ATP III Classification       Desirable:  < 200 mg/dL               Borderline High:  200 - 239 mg/dL          High:  > = 240 mg/dL   Triglycerides 112.0 0.0 - 149.0 mg/dL    Comment: Normal:  <150 mg/dLBorderline High:  150 - 199 mg/dL   HDL 68.20 >39.00 mg/dL   VLDL 22.4 0.0 - 40.0 mg/dL   LDL Cholesterol 106 (H) 0 - 99 mg/dL   Total CHOL/HDL Ratio 3     Comment:                Men          Women1/2 Average Risk     3.4          3.3Average Risk          5.0          4.42X Average Risk          9.6          7.13X Average Risk          15.0          11.0                       NonHDL 128.34     Comment: NOTE:  Non-HDL goal should be 30 mg/dL higher than patient's LDL goal (i.e. LDL goal of < 70 mg/dL, would have non-HDL goal of < 100 mg/dL)  Hemoglobin A1c     Status: Abnormal   Collection Time: 07/25/19  3:53 PM  Result Value Ref Range   Hgb A1c MFr Bld 6.7 (H) 4.6 - 6.5 %    Comment: Glycemic Control Guidelines for People with Diabetes:Non Diabetic:  <6%Goal of Therapy: <7%Additional Action Suggested:  >8%   Microalbumin / creatinine urine ratio     Status: Abnormal   Collection Time: 07/25/19  3:53 PM  Result Value Ref Range   Microalb, Ur 4.2 (H) 0.0 - 1.9 mg/dL   Creatinine,U 153.6 mg/dL   Microalb Creat Ratio 2.7 0.0 - 30.0 mg/g  VITAMIN D 25 Hydroxy (Vit-D Deficiency, Fractures)     Status: Abnormal   Collection Time: 07/25/19  3:53 PM  Result Value Ref Range   VITD 28.95 (  L) 30.00 - [id number] ng/mL  Follicle stimulating hormone     Status: None   Collection Time: 07/25/19  3:53 PM  Result Value Ref Range   FSH 51.9 mIU/ML    Comment: Female Reference Range:  1.4-18.1 mIU/mLFemale Reference Range:Follicular Phase          2.5-10.2 mIU/mLMidCycle Peak          3.4-33.4 mIU/mLLuteal Phase          1.5-9.1 mIU/mLPost Menopausal     23.0-116.3 mIU/mLPregnant          <0.3 mIU/mL   Luteinizing hormone     Status: None   Collection Time: 07/25/19  3:53 PM  Result Value Ref Range   LH 39.96 mIU/mL    Comment: Female Reference Range:20-70 yrs     1.5-9.3 mIU/mL>70 yrs       3.1-35.6 mIU/mLFemale Reference Range:Follicular Phase     [id number] mIU/mLMidcycle             8.7-76.3 mIU/mLLuteal Phase         0.5-16.9 mIU/mL  Post Menopausal      15.9-54.0  mIU/mLPregnant             <1.5 mIU/mLContraceptives       0.7-5.6 mIU/mL   Cytology - PAP([place])     Status: None   Collection Time: 07/26/19 12:00 AM  Result Value Ref Range   Adequacy      Satisfactory for evaluation. The presence or absence of an endocervical / transformation zone component cannot be determined because of atrophy.   Diagnosis      NEGATIVE FOR INTRAEPITHELIAL LESIONS OR MALIGNANCY.   Chlamydia Negative     Comment: Normal Reference Range - Negative   Neisseria Gonorrhea Negative     Comment: Normal Reference Range - Negative   Trichomonas Negative     Comment: Normal Reference Range - Negative   HPV NOT Detected     Comment: Normal Reference Range - NOT Detected   Material Submitted CervicoVaginal Pap [ThinPrep Imaged]    CYTOLOGY - PAP PAP RESULT   Basic metabolic panel     Status: Abnormal   Collection Time: 08/28/19  7:56 PM  Result Value Ref Range   Sodium 138 135 - 145 mmol/L   Potassium 4.6 3.5 - 5.1 mmol/L   Chloride 106 98 - 111 mmol/L   CO2 26 22 - 32 mmol/L   Glucose, Bld 102 (H) 70 - 99 mg/dL   BUN 17 6 - 20 mg/dL   Creatinine, Ser 1.37 (H) 0.44 - 1.00 mg/dL   Calcium 9.2 8.9 - 10.3 mg/dL   GFR calc non Af Amer 42 (L) >60 mL/min   GFR calc Af Amer 49 (L) >60 mL/min   Anion gap 6 5 - 15    Comment: Performed at [facility], [place] [address] [place], [place] [address]  CBC     Status: Abnormal   Collection Time: 08/28/19  7:56 PM  Result Value Ref Range   WBC 8.2 4.0 - 10.5 K/uL   RBC 4.01 3.87 - 5.11 MIL/uL   Hemoglobin 11.0 (L) 12.0 - 15.0 g/dL   HCT 34.8 (L)  36.0 - 46.0 %   MCV 86.8 80.0 - 100.0 fL   MCH 27.4 26.0 - 34.0 pg   MCHC 31.6 30.0 - 36.0 g/dL   RDW 14.5 11.5 - 15.5 %   Platelets 238 150 - 400 K/uL   nRBC 0.0 0.0 - 0.2 %  Comment: Performed at [facility], [place] [address] [place], [place] [address]  Troponin I (High Sensitivity)     Status: Abnormal   Collection Time: 08/28/19  7:56 PM  Result Value Ref Range   Troponin I (High Sensitivity) 22 (H) <18 ng/L    Comment: (NOTE) Elevated high sensitivity troponin I (hsTnI) values and significant  changes across serial measurements may suggest ACS but many other  chronic and acute conditions are known to elevate hsTnI results.  Refer to the "Links" section for chest pain algorithms and additional  guidance. Performed at [facility], [place] [address], [place], [place] [address]   I-Stat beta hCG blood, ED     Status: None   Collection Time: 08/28/19  8:14 PM  Result Value Ref Range   I-stat hCG, quantitative <5.0 <5 mIU/mL   Comment 3            Comment:   GEST. AGE      CONC.  (mIU/mL)   <=1 WEEK        5 - 50     2 WEEKS       50 - 500     3 WEEKS       100 - 10,000     4 WEEKS     1,000 - 30,000        FEMALE AND NON-PREGNANT FEMALE:     LESS THAN 5 mIU/mL   Novel Coronavirus, NAA (Hosp order, Send-out to Ref Lab; TAT 18-24 hrs     Status: None   Collection Time: 08/28/19 11:27 PM   Specimen: Nasopharyngeal Swab; Respiratory  Result Value Ref Range   SARS-CoV-2, NAA NOT DETECTED NOT DETECTED    Comment: (NOTE) This nucleic acid amplification test was developed and its performance characteristics determined by [employer]. Nucleic acid amplification tests include PCR and TMA. This test has not been FDA cleared or approved. This test has been authorized by FDA under an Emergency Use Authorization (EUA). This test is only authorized for the duration of time the declaration that circumstances exist justifying the authorization of the emergency use of  in vitro diagnostic tests for detection of SARS-CoV-2 virus and/or diagnosis of COVID-19 infection under section 564(b)(1) of the Act, 21 U.S.C. [id number]) (1), unless the authorization is terminated or revoked sooner. When diagnostic testing is negative, the possibility of a false negative result should be considered in the context of a patient's recent exposures and the presence of clinical signs and symptoms consistent with COVID-19. An individual without symptoms of COVID- 19 and who is not shedding SARS-CoV-2 vi rus would expect to have a negative (not detected) result in this assay. Performed At: [facility] [place], [place] [id number] Rush Farmer MD [id number]    Coronavirus Source NASOPHARYNGEAL     Comment: Performed at [facility], [place] [address], [place], [place] [address]  Troponin I (High Sensitivity)     Status: Abnormal   Collection Time: 08/28/19 11:27 PM  Result Value Ref Range   Troponin I (High Sensitivity) 23 (H) <18 ng/L    Comment: (NOTE) Elevated high sensitivity troponin I (hsTnI) values and significant  changes across serial measurements may suggest ACS but many other  chronic and acute conditions are known to elevate hsTnI results.  Refer to the "Links" section for chest pain algorithms and additional  guidance. Performed at [facility], [place] [address] [place],  [address]   Troponin  I (High Sensitivity)     Status: Abnormal   Collection Time: 08/29/19  1:27 AM  Result Value Ref Range   Troponin I (High Sensitivity) 23 (H) <18 ng/L    Comment: (NOTE) Elevated high sensitivity troponin I (hsTnI) values and significant  changes across serial measurements may suggest ACS but many other  chronic and acute conditions are known to elevate hsTnI results.  Refer to the "Links" section for chest pain algorithms and additional  guidance. Performed at [facility], [place] [address], [place],  [place] [address]   SARS CORONAVIRUS 2 (TAT 6-24 HRS) Nasopharyngeal Nasopharyngeal Swab     Status: None   Collection Time: 08/29/19  1:49 AM   Specimen: Nasopharyngeal Swab  Result Value Ref Range   SARS Coronavirus 2 NEGATIVE NEGATIVE    Comment: (NOTE) SARS-CoV-2 target nucleic acids are NOT DETECTED. The SARS-CoV-2 RNA is generally detectable in upper and lower respiratory specimens during the acute phase of infection. Negative results do not preclude SARS-CoV-2 infection, do not rule out co-infections with other pathogens, and should not be used as the sole basis for treatment or other patient management decisions. Negative results must be combined with clinical observations, patient history, and epidemiological information. The expected result is Negative. Fact Sheet for Patients: SugarRoll.be Fact Sheet for Healthcare Providers: https://www.woods-mathews.com/ This test is not yet approved or cleared by the [place] FDA and  has been authorized for detection and/or diagnosis of SARS-CoV-2 by FDA under an Emergency Use Authorization (EUA). This EUA will remain  in effect (meaning this test can be used) for the duration of the COVID-19 declaration under Section 56 4(b)(1) of the Act, 21 U.S.C. section 360bbb-3(b)(1), unless the authorization is terminated or revoked sooner. Performed at [facility], [place] [address] [place], [place] [address]   Troponin I (High Sensitivity)     Status: Abnormal   Collection Time: 08/29/19  2:54 AM  Result Value Ref Range   Troponin I (High Sensitivity) 22 (H) <18 ng/L    Comment: (NOTE) Elevated high sensitivity troponin I (hsTnI) values and significant  changes across serial measurements may suggest ACS but many other  chronic and acute conditions are known to elevate hsTnI results.  Refer to the "Links" section for chest pain algorithms and additional  guidance. Performed at [facility], [place] [address], [place], [place] [address]   D-dimer, quantitative (not at [facility])     Status: Abnormal   Collection Time: 08/29/19  2:54 AM  Result Value Ref Range   D-Dimer, Quant 0.94 (H) 0.00 - 0.50 ug/mL-FEU    Comment: (NOTE) At the manufacturer cut-off of 0.50 ug/mL FEU, this assay has been documented to exclude PE with a sensitivity and negative predictive value of 97 to 99%.  At this time, this assay has not been approved by the FDA to exclude DVT/VTE. Results should be correlated with clinical presentation. Performed at [facility], [place] [address] [place], [place] [address]   HIV antibody (Routine Testing)     Status: None   Collection Time: 08/29/19  5:55 AM  Result Value Ref Range   HIV Screen 4th Generation wRfx Non Reactive Non Reactive    Comment: (NOTE) Performed At: [facility] [place], [place] [id number] Rush Farmer MD [id number]   Comprehensive metabolic panel     Status: Abnormal   Collection Time: 08/29/19  5:55 AM  Result Value Ref Range   Sodium 137 135 -  145 mmol/L   Potassium 4.2 3.5 - 5.1 mmol/L   Chloride 105 98 - 111 mmol/L   CO2 24 22 - 32 mmol/L   Glucose, Bld 112 (H) 70 - 99 mg/dL   BUN 18 6 - 20 mg/dL   Creatinine, Ser 1.23 (H) 0.44 - 1.00 mg/dL   Calcium 8.9 8.9 - 10.3 mg/dL   Total Protein 6.7 6.5 - 8.1 g/dL   Albumin 3.5 3.5 - 5.0 g/dL   AST 20 15 - 41 U/L   ALT 18 0 - 44 U/L   Alkaline Phosphatase 76 38 - 126 U/L   Total Bilirubin 0.3 0.3 - 1.2 mg/dL   GFR calc non Af Amer 48 (L) >60 mL/min   GFR calc Af Amer 56 (L) >60 mL/min   Anion gap 8 5 - 15    Comment: Performed at [place] [address] [place], [place] [address]  CBG monitoring, ED     Status: None   Collection Time: 08/29/19  6:03 AM  Result Value Ref Range   Glucose-Capillary 99 70 - 99 mg/dL  Heparin level (unfractionated)     Status: None   Collection Time: 08/29/19 10:08 AM  Result Value Ref Range   Heparin  Unfractionated 0.57 0.30 - 0.70 IU/mL    Comment: (NOTE) If heparin results are below expected values, and patient dosage has  been confirmed, suggest follow up testing of antithrombin III levels. Performed at [facility], [place] [address], [place], [place] [address]   CBG monitoring, ED     Status: None   Collection Time: 08/29/19 12:04 PM  Result Value Ref Range   Glucose-Capillary 90 70 - 99 mg/dL  CBC     Status: Abnormal   Collection Time: 08/29/19  3:31 PM  Result Value Ref Range   WBC 7.5 4.0 - 10.5 K/uL   RBC 4.39 3.87 - 5.11 MIL/uL   Hemoglobin 11.5 (L) 12.0 - 15.0 g/dL   HCT 37.8 36.0 - 46.0 %   MCV 86.1 80.0 - 100.0 fL   MCH 26.2 26.0 - 34.0 pg   MCHC 30.4 30.0 - 36.0 g/dL   RDW 14.4 11.5 - 15.5 %   Platelets 229 150 - 400 K/uL   nRBC 0.0 0.0 - 0.2 %    Comment: Performed at [facility], [place] [address], [place], [place] [address]  Heparin level (unfractionated)     Status: None   Collection Time: 08/29/19  3:31 PM  Result Value Ref Range   Heparin Unfractionated 0.40 0.30 - 0.70 IU/mL    Comment: (NOTE) If heparin results are below expected values, and patient dosage has  been confirmed, suggest follow up testing of antithrombin III levels. Performed at [facility], [place] [address], [place], [place] [address]   Glucose, capillary     Status: None   Collection Time: 08/29/19  4:14 PM  Result Value Ref Range   Glucose-Capillary 79 70 - 99 mg/dL   Comment 1 Notify RN    Comment 2 Document in Chart   Glucose, capillary     Status: Abnormal   Collection Time: 08/29/19  9:36 PM  Result Value Ref Range   Glucose-Capillary 127 (H) 70 - 99 mg/dL  Glucose, capillary     Status: Abnormal   Collection Time: 08/30/19 12:24 AM  Result Value Ref Range   Glucose-Capillary 102 (H) 70 - 99 mg/dL  Heparin level (unfractionated)     Status:  None   Collection Time: 08/30/19  4:09 AM  Result Value Ref Range   Heparin Unfractionated 0.51 0.30 - 0.70 IU/mL     Comment: (NOTE) If heparin results are below expected values, and patient dosage has  been confirmed, suggest follow up testing of antithrombin III levels. Performed at [facility], [place] [address], [place], [place] [address]   CBC     Status: Abnormal   Collection Time: 08/30/19  4:09 AM  Result Value Ref Range   WBC 6.8 4.0 - 10.5 K/uL   RBC 3.81 (L) 3.87 - 5.11 MIL/uL   Hemoglobin 10.3 (L) 12.0 - 15.0 g/dL   HCT 32.5 (L) 36.0 - 46.0 %   MCV 85.3 80.0 - 100.0 fL   MCH 27.0 26.0 - 34.0 pg   MCHC 31.7 30.0 - 36.0 g/dL   RDW 14.6 11.5 - 15.5 %   Platelets 216 150 - 400 K/uL   nRBC 0.0 0.0 - 0.2 %    Comment: Performed at [facility], [place] [address], [place], [place] [address]  Glucose, capillary     Status: None   Collection Time: 08/30/19  5:48 AM  Result Value Ref Range   Glucose-Capillary 94 70 - 99 mg/dL  Basic metabolic panel     Status: Abnormal   Collection Time: 08/30/19  9:42 AM  Result Value Ref Range   Sodium 139 135 - 145 mmol/L   Potassium 4.4 3.5 - 5.1 mmol/L   Chloride 106 98 - 111 mmol/L   CO2 25 22 - 32 mmol/L   Glucose, Bld 132 (H) 70 - 99 mg/dL   BUN 15 6 - 20 mg/dL   Creatinine, Ser 1.17 (H) 0.44 - 1.00 mg/dL   Calcium 9.1 8.9 - 10.3 mg/dL   GFR calc non Af Amer 51 (L) >60 mL/min   GFR calc Af Amer 59 (L) >60 mL/min   Anion gap 8 5 - 15    Comment: Performed at [facility], [place] [address] [place], [place] [address]  Glucose, capillary     Status: Abnormal   Collection Time: 08/30/19 12:13 PM  Result Value Ref Range   Glucose-Capillary 110 (H) 70 - 99 mg/dL  ECHOCARDIOGRAM COMPLETE     Status: None   Collection Time: 08/30/19 12:18 PM  Result Value Ref Range   Weight 4,051.2 oz   Height 62 in   BP 164/109 mmHg  Glucose, capillary     Status: None   Collection Time: 08/30/19  5:08 PM  Result Value Ref Range   Glucose-Capillary 89 70 - 99 mg/dL  Glucose, capillary     Status: Abnormal   Collection Time: 08/30/19  8:01 PM   Result Value Ref Range   Glucose-Capillary 130 (H) 70 - 99 mg/dL  Glucose, capillary     Status: Abnormal   Collection Time: 08/31/19 12:18 AM  Result Value Ref Range   Glucose-Capillary 106 (H) 70 - 99 mg/dL  Glucose, capillary     Status: None   Collection Time: 08/31/19  3:43 AM  Result Value Ref Range   Glucose-Capillary 98 70 - 99 mg/dL  Glucose, capillary     Status: Abnormal   Collection Time: 08/31/19  7:22 AM  Result Value Ref Range   Glucose-Capillary 101 (H) 70 - 99 mg/dL     Psychiatric Specialty Exam: Physical Exam  ROS  Last menstrual period 02/02/2017.There is no height or weight on file to calculate BMI.  General  Appearance: NA  Eye Contact:  NA  Speech:  Clear and Coherent and Slow  Volume:  Normal  Mood:  Dysphoric  Affect:  NA  Thought Process:  Descriptions of Associations: Intact  Orientation:  Full (Time, Place, and Person)  Thought Content:  Paranoid Ideation, Rumination and trust issues with people  Suicidal Thoughts:  No  Homicidal Thoughts:  No  Memory:  Immediate;   Good Recent;   Good Remote;   Good  Judgement:  Good  Insight:  Good  Psychomotor Activity:  NA  Concentration:  Concentration: Fair and Attention Span: Fair  Recall:  Good  Fund of Knowledge:  Good  Language:  Good  Akathisia:  No  Handed:  Right  AIMS (if indicated):     Assets:  Communication Skills Housing Resilience Social Support  ADL's:  Intact  Cognition:  WNL  Sleep:   ok      Assessment and Plan: Patient was recently had abdominal surgery for her adrenal gland and then she admitted for hypertensive crisis.  She admitted feeling overwhelmed with health issues.  She is slowly and gradually getting better and she feels her paranoia is less intense.  I recommend to continue 1 mg Haldol and if she does not feel improvement in 3 to 4 weeks then we will consider adjusting the dose of Haldol.  She agreed with the plan.  I also encouraged to keep appointment for new  sleep study so she can get CPAP.  Recommended to call us back if she is any question of any concern.  Follow-up in 2 months.  Follow Up Instructions:    I discussed the assessment and treatment plan with the patient. The patient was provided an opportunity to ask questions and all were answered. The patient agreed with the plan and demonstrated an understanding of the instructions.   The patient was advised to call back or seek an in-person evaluation if the symptoms worsen or if the condition fails to improve as anticipated.  I provided 20 minutes of non-face-to-face time during this encounter.   Kathlee Nations, MD

## 2019-10-15 DIAGNOSIS — J4521 Mild intermittent asthma with (acute) exacerbation: Secondary | ICD-10-CM | POA: Diagnosis not present

## 2019-10-16 ENCOUNTER — Other Ambulatory Visit: Payer: Self-pay

## 2019-10-16 ENCOUNTER — Encounter (HOSPITAL_COMMUNITY): Payer: Self-pay | Admitting: *Deleted

## 2019-10-16 ENCOUNTER — Observation Stay (HOSPITAL_COMMUNITY)
Admission: AD | Admit: 2019-10-16 | Discharge: 2019-10-17 | Disposition: A | Discharge status: Other Acute Inpatient Hospital | Payer: Medicare HMO | Attending: Psychiatry | Admitting: Psychiatry

## 2019-10-16 DIAGNOSIS — E119 Type 2 diabetes mellitus without complications: Secondary | ICD-10-CM | POA: Diagnosis not present

## 2019-10-16 DIAGNOSIS — R69 Illness, unspecified: Secondary | ICD-10-CM | POA: Diagnosis not present

## 2019-10-16 DIAGNOSIS — J45909 Unspecified asthma, uncomplicated: Secondary | ICD-10-CM | POA: Diagnosis not present

## 2019-10-16 DIAGNOSIS — F200 Paranoid schizophrenia: Secondary | ICD-10-CM | POA: Diagnosis present

## 2019-10-16 DIAGNOSIS — Z7982 Long term (current) use of aspirin: Secondary | ICD-10-CM | POA: Insufficient documentation

## 2019-10-16 DIAGNOSIS — I10 Essential (primary) hypertension: Secondary | ICD-10-CM | POA: Insufficient documentation

## 2019-10-16 DIAGNOSIS — D649 Anemia, unspecified: Secondary | ICD-10-CM | POA: Diagnosis not present

## 2019-10-16 DIAGNOSIS — Z20828 Contact with and (suspected) exposure to other viral communicable diseases: Secondary | ICD-10-CM | POA: Diagnosis not present

## 2019-10-16 DIAGNOSIS — G4730 Sleep apnea, unspecified: Secondary | ICD-10-CM | POA: Diagnosis not present

## 2019-10-16 DIAGNOSIS — Z79890 Hormone replacement therapy (postmenopausal): Secondary | ICD-10-CM | POA: Diagnosis not present

## 2019-10-16 DIAGNOSIS — Z79899 Other long term (current) drug therapy: Secondary | ICD-10-CM | POA: Diagnosis not present

## 2019-10-16 DIAGNOSIS — K219 Gastro-esophageal reflux disease without esophagitis: Secondary | ICD-10-CM | POA: Diagnosis not present

## 2019-10-16 DIAGNOSIS — E785 Hyperlipidemia, unspecified: Secondary | ICD-10-CM | POA: Insufficient documentation

## 2019-10-16 DIAGNOSIS — E079 Disorder of thyroid, unspecified: Secondary | ICD-10-CM | POA: Insufficient documentation

## 2019-10-16 LAB — SARS CORONAVIRUS 2 BY RT PCR (HOSPITAL ORDER, PERFORMED IN ~~LOC~~ HOSPITAL LAB): SARS Coronavirus 2: NEGATIVE

## 2019-10-16 MED ORDER — HYDRALAZINE HCL 25 MG PO TABS
25.0000 mg | ORAL_TABLET | Freq: Three times a day (TID) | ORAL | Status: DC
  Administered 2019-10-17 (×2): 25 mg via ORAL
  Filled 2019-10-16 (×2): qty 1

## 2019-10-16 MED ORDER — CARVEDILOL 12.5 MG PO TABS
25.0000 mg | ORAL_TABLET | Freq: Two times a day (BID) | ORAL | Status: DC
  Administered 2019-10-17: 08:00:00 25 mg via ORAL
  Filled 2019-10-16: qty 2

## 2019-10-16 MED ORDER — ALBUTEROL SULFATE HFA 108 (90 BASE) MCG/ACT IN AERS: 2.0000 | INHALATION_SPRAY | Freq: Four times a day (QID) | RESPIRATORY_TRACT | Status: DC | PRN

## 2019-10-16 MED ORDER — ASPIRIN 81 MG PO TBEC
81.0000 mg | DELAYED_RELEASE_TABLET | Freq: Every day | ORAL | Status: DC
  Administered 2019-10-16 – 2019-10-17 (×2): 81 mg via ORAL
  Filled 2019-10-16 (×6): qty 1

## 2019-10-16 MED ORDER — HALOPERIDOL 0.5 MG PO TABS
1.0000 mg | ORAL_TABLET | Freq: Every day | ORAL | Status: DC
  Administered 2019-10-16: 22:00:00 1 mg via ORAL
  Filled 2019-10-16: qty 2

## 2019-10-16 MED ORDER — ATORVASTATIN CALCIUM 10 MG PO TABS: 40.0000 mg | ORAL_TABLET | Freq: Every day | ORAL | Status: DC
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
## 2019-10-16 NOTE — BH Assessment (Signed)
Patient accepted to [facility] [place] 3[address] bld[address]  Accepting Dr. Elaina Hoops The number to give report [phone] The date and time that the patient can be transported today 10/16/2019 ot tomorrow 10/17/2019 after 10am

## 2019-10-16 NOTE — Care Management (Signed)
Under review [place], [place], [place], [employer], [place], [place], [place], [place], [place], [place], [place], [place], [place], [place], [place], [place], [place], [place], [place], [place], [place], [place], [place], [employer] [place] [employer], [place], [place], [place], [place], [facility], [place], [place], [place]

## 2019-10-16 NOTE — Progress Notes (Signed)
Patient ID: Maria Franklin, female   DOB: 1961/08/09, 58 y.o.   MRN: [id number] Pt A&O x 4, resting in bed at present.  Calm & cooperative, no distress noted.  Monitoring for safety.  Pt is IVC.  Pending [facility] in am.  Transport via [place].
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
## 2019-10-16 NOTE — Plan of Care (Signed)
[facility] Crisis Plan  Reason for Crisis Plan:  Chronic Mental Illness/Medical Illness, Crisis Stabilization and Medication Management   Plan of Care:  Referral for Inpatient Hospitalization and Referral for Telepsychiatry/Psychiatric Consult  Family Support:    Husband, son  Current Living Environment:  Living Arrangements: Spouse/significant other  Insurance:   Hospital Account    Name Acct ID Class Status Primary Coverage   Maria, Franklin [id number] [facility]        Guarantor Account (for Hospital Account [id number])    Name Relation to Pt Service Area Active? Acct Type   Maria Franklin Self [facility] Yes Behavioral Health   Address Phone       [address] [place], [place] [address] [phone]          Coverage Information (for Hospital Account [id number])    F/O Payor/Plan Precert #   [employer] [place] MEDICARE HMO/PPO    Subscriber Subscriber #   Maria, Franklin [facility]   Address Phone   [address] [place] [place] [address] [phone]      Legal Guardian:   none  Primary Care Provider:  Billie Ruddy, MD  Current Outpatient Providers:  Maria Franklin  Psychiatrist:  Name of Psychiatrist: Dr. Adele Franklin  Counselor/Therapist:  Name of Therapist: none  Compliant with Medications:  Yes  Additional Information:   Maria Franklin 11/10/20204:49 PM
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
## 2019-10-17 DIAGNOSIS — D649 Anemia, unspecified: Secondary | ICD-10-CM | POA: Diagnosis not present

## 2019-10-17 DIAGNOSIS — R69 Illness, unspecified: Secondary | ICD-10-CM | POA: Diagnosis not present

## 2019-10-17 DIAGNOSIS — E039 Hypothyroidism, unspecified: Secondary | ICD-10-CM | POA: Diagnosis not present

## 2019-10-17 DIAGNOSIS — I10 Essential (primary) hypertension: Secondary | ICD-10-CM | POA: Diagnosis not present

## 2019-10-17 DIAGNOSIS — F200 Paranoid schizophrenia: Secondary | ICD-10-CM | POA: Diagnosis not present

## 2019-10-17 DIAGNOSIS — Z62810 Personal history of physical and sexual abuse in childhood: Secondary | ICD-10-CM | POA: Diagnosis not present

## 2019-10-17 DIAGNOSIS — E785 Hyperlipidemia, unspecified: Secondary | ICD-10-CM | POA: Diagnosis not present

## 2019-10-17 DIAGNOSIS — J45909 Unspecified asthma, uncomplicated: Secondary | ICD-10-CM | POA: Diagnosis not present

## 2019-10-17 DIAGNOSIS — K219 Gastro-esophageal reflux disease without esophagitis: Secondary | ICD-10-CM | POA: Diagnosis not present

## 2019-10-17 LAB — CBC
HCT: 36.1 % (ref 36.0–46.0)
Hemoglobin: 11 g/dL — ABNORMAL LOW (ref 12.0–15.0)
MCH: 26.8 pg (ref 26.0–34.0)
MCHC: 30.5 g/dL (ref 30.0–36.0)
MCV: 88 fL (ref 80.0–100.0)
Platelets: 261 10*3/uL (ref 150–400)
RBC: 4.1 MIL/uL (ref 3.87–5.11)
RDW: 14.4 % (ref 11.5–15.5)
WBC: 7.1 10*3/uL (ref 4.0–10.5)
nRBC: 0 % (ref 0.0–0.2)

## 2019-10-17 LAB — ETHANOL: Alcohol, Ethyl (B): <10 mg/dL (ref <10)

## 2019-10-17 LAB — COMPREHENSIVE METABOLIC PANEL WITH GFR
ALT: 24 U/L (ref 0–44)
AST: 23 U/L (ref 15–41)
Albumin: 3.8 g/dL (ref 3.5–5.0)
Alkaline Phosphatase: 89 U/L (ref 38–126)
Anion gap: 9 (ref 5–15)
BUN: 22 mg/dL — ABNORMAL HIGH (ref 6–20)
CO2: 24 mmol/L (ref 22–32)
Calcium: 9.3 mg/dL (ref 8.9–10.3)
Chloride: 103 mmol/L (ref 98–111)
Creatinine, Ser: 1.47 mg/dL — ABNORMAL HIGH (ref 0.44–1.00)
GFR calc Af Amer: 45 mL/min — ABNORMAL LOW (ref >60)
GFR calc non Af Amer: 39 mL/min — ABNORMAL LOW (ref >60)
Glucose, Bld: 160 mg/dL — ABNORMAL HIGH (ref 70–99)
Potassium: 4.3 mmol/L (ref 3.5–5.1)
Sodium: 136 mmol/L (ref 135–145)
Total Bilirubin: 0.6 mg/dL (ref 0.3–1.2)
Total Protein: 7.8 g/dL (ref 6.5–8.1)

## 2019-10-17 LAB — LIPID PANEL
Cholesterol: 153 mg/dL (ref 0–200)
HDL: 59 mg/dL (ref >40)
LDL Cholesterol: 76 mg/dL (ref 0–99)
Total CHOL/HDL Ratio: 2.6 RATIO
Triglycerides: 89 mg/dL (ref <150)
VLDL: 18 mg/dL (ref 0–40)

## 2019-10-17 LAB — HEMOGLOBIN A1C
Hgb A1c MFr Bld: 6.2 % — ABNORMAL HIGH (ref 4.8–5.6)
Mean Plasma Glucose: 131.24 mg/dL

## 2019-10-17 LAB — TSH: TSH: 2.644 u[IU]/mL (ref 0.350–4.500)

## 2019-10-17 NOTE — Progress Notes (Signed)
Patient is awake in room. Alert and oriented. Anxious and paranoid, complaining that she did not need to be here. Thinks that her family "planned all this". Patient is suspicious and guarded. Denying hallucinations at this moment. Denying suicidal thoughts. Patient ate breakfast and received blood pressure medications. BP at 0900: 150/88, pulse 79. Patient spoke to her son over the phone and asked staff to give her phone to her son to take it home and use it to pay her bills. Patient's husband visited but patient did not want to talk to him.

## 2019-10-17 NOTE — Discharge Summary (Addendum)
  [facility] is to be transferred to [facility] for [facility]  Attest to NP note

## 2019-10-17 NOTE — Progress Notes (Signed)
Patient transferred to [facility] via [facility]. Nursing report given to [facility], RN. No sign of distress upon discharge.

## 2019-10-18 DIAGNOSIS — R69 Illness, unspecified: Secondary | ICD-10-CM | POA: Diagnosis not present

## 2019-10-18 LAB — PROLACTIN: Prolactin: 18.1 ng/mL (ref 4.8–23.3)

## 2019-10-19 DIAGNOSIS — R69 Illness, unspecified: Secondary | ICD-10-CM | POA: Diagnosis not present

## 2019-10-20 DIAGNOSIS — R69 Illness, unspecified: Secondary | ICD-10-CM | POA: Diagnosis not present

## 2019-10-21 DIAGNOSIS — R69 Illness, unspecified: Secondary | ICD-10-CM | POA: Diagnosis not present

## 2019-10-22 DIAGNOSIS — R69 Illness, unspecified: Secondary | ICD-10-CM | POA: Diagnosis not present

## 2019-10-23 DIAGNOSIS — R69 Illness, unspecified: Secondary | ICD-10-CM | POA: Diagnosis not present

## 2019-10-24 DIAGNOSIS — R69 Illness, unspecified: Secondary | ICD-10-CM | POA: Diagnosis not present

## 2019-10-29 ENCOUNTER — Other Ambulatory Visit: Payer: Self-pay

## 2019-10-29 ENCOUNTER — Ambulatory Visit: Payer: Medicare HMO

## 2019-10-29 DIAGNOSIS — G4733 Obstructive sleep apnea (adult) (pediatric): Secondary | ICD-10-CM

## 2019-10-30 DIAGNOSIS — G4733 Obstructive sleep apnea (adult) (pediatric): Secondary | ICD-10-CM

## 2019-10-31 ENCOUNTER — Telehealth: Payer: Self-pay

## 2019-10-31 NOTE — Telephone Encounter (Signed)
Call made to patient, confirmed DOB, made aware of HST results:  AHI 7.0, mild osa  Cpap therapy may be considered if patient has significant daytime symptoms taht can be ascribed to be an effect of sleep disordered breathing.   Recommend cpap auto titrating 5-15 cm.   Pt declines cpap at this time. She states she is not having any siginifcant symptoms and if something arise, she will contact the office.   Made aware I will route message to AO as FYI. Voiced understanding.   Nothing further needed at this time.

## 2019-11-12 ENCOUNTER — Other Ambulatory Visit: Payer: Self-pay

## 2019-11-12 ENCOUNTER — Ambulatory Visit (INDEPENDENT_AMBULATORY_CARE_PROVIDER_SITE_OTHER): Payer: Medicare HMO | Admitting: Psychiatry

## 2019-11-12 DIAGNOSIS — F200 Paranoid schizophrenia: Secondary | ICD-10-CM

## 2019-11-12 DIAGNOSIS — F419 Anxiety disorder, unspecified: Secondary | ICD-10-CM | POA: Diagnosis not present

## 2019-11-12 DIAGNOSIS — R69 Illness, unspecified: Secondary | ICD-10-CM | POA: Diagnosis not present

## 2019-11-12 MED ORDER — HYDROXYZINE PAMOATE 25 MG PO CAPS: 25.0000 mg | ORAL_CAPSULE | Freq: Every day | ORAL | 1 refills | Status: DC

## 2019-11-12 MED ORDER — HALOPERIDOL 5 MG PO TABS: 5.0000 mg | ORAL_TABLET | Freq: Every day | ORAL | 1 refills | Status: DC

## 2019-11-12 NOTE — Progress Notes (Signed)
Virtual Visit via Telephone Note  I connected with Maria Franklin on 11/12/19 at  2:20 PM EST by telephone and verified that I am speaking with the correct person using two identifiers.   I discussed the limitations, risks, security and privacy concerns of performing an evaluation and management service by telephone and the availability of in person appointments. I also discussed with the patient that there may be a patient responsible charge related to this service. The patient expressed understanding and agreed to proceed.   History of Present Illness: Patient was evaluated by phone session.  She was recently admitted at [facility] due to paranoia.  Patient told it was a miscommunication with her family member.  She admitted that she was having some paranoia when she had her last visit with this Probation officer.  However she turned off her phone and her family thought that she is not communicating and not safe by herself.  The call police and committed her to the hospital.  She stayed at old [facility] for few days and she was discharged on Haldol 5 mg which was increased from 2 mg.  She feels things are going very well.  She does not feel any paranoia and she has a better communication with her family.  She lives with her husband who is very supportive.  Her only concern is that sometimes she has a muscle stiffness and she feels very nervous and anxious.  However her sleep is good.  She denies any irritability, hallucination or any suicidal thoughts.  Her energy level is fair.  She had a quite Thanksgiving.  Her son lives in [place] and she is in contact with him.  She like to have a therapist for coping skills which we have recommended in the past.  Psychiatric History:Reviewed. H/O paranoia and delusions.H/O overdose and [facility] H/O overdose and inpatientat [facility]. Readmitted in 4 months due tononcompliant with medication.Last admission at  [facility] in Nov 2020. Abilifyworked wellbutcould not afford. Noh/o abuse. eadmitted in 4 months due tononcompliant with medication.Abilifyworked wellbutcould not afford. Noh/o abuse.   Recent Results (from the past 2160 hour(s))  Basic metabolic panel     Status: Abnormal   Collection Time: 08/28/19  7:56 PM  Result Value Ref Range   Sodium 138 135 - 145 mmol/L   Potassium 4.6 3.5 - 5.1 mmol/L   Chloride 106 98 - 111 mmol/L   CO2 26 22 - 32 mmol/L   Glucose, Bld 102 (H) 70 - 99 mg/dL   BUN 17 6 - 20 mg/dL   Creatinine, Ser 1.37 (H) 0.44 - 1.00 mg/dL   Calcium 9.2 8.9 - 10.3 mg/dL   GFR calc non Af Amer 42 (L) >60 mL/min   GFR calc Af Amer 49 (L) >60 mL/min   Anion gap 6 5 - 15    Comment: Performed at [facility] [address], [place], [place] [address]  CBC     Status: Abnormal   Collection Time: 08/28/19  7:56 PM  Result Value Ref Range   WBC 8.2 4.0 - 10.5 K/uL   RBC 4.01 3.87 - 5.11 MIL/uL   Hemoglobin 11.0 (L) 12.0 - 15.0 g/dL   HCT 34.8 (L) 36.0 - 46.0 %   MCV 86.8 80.0 - 100.0 fL   MCH 27.4 26.0 - 34.0 pg   MCHC 31.6 30.0 - 36.0 g/dL   RDW 14.5 11.5 - 15.5 %   Platelets 238 150 -  400 K/uL   nRBC 0.0 0.0 - 0.2 %    Comment: Performed at [facility], [place] [address] [place], [place] [address]  Troponin I (High Sensitivity)     Status: Abnormal   Collection Time: 08/28/19  7:56 PM  Result Value Ref Range   Troponin I (High Sensitivity) 22 (H) <18 ng/L    Comment: (NOTE) Elevated high sensitivity troponin I (hsTnI) values and significant  changes across serial measurements may suggest ACS but many other  chronic and acute conditions are known to elevate hsTnI results.  Refer to the "Links" section for chest pain algorithms and additional  guidance. Performed at [facility], [place] [address] [place], [place] [address]   I-Stat beta hCG blood, ED     Status: None   Collection Time: 08/28/19  8:14 PM  Result Value Ref Range    I-stat hCG, quantitative <5.0 <5 mIU/mL   Comment 3            Comment:   GEST. AGE      CONC.  (mIU/mL)   <=1 WEEK        5 - 50     2 WEEKS       50 - 500     3 WEEKS       100 - 10,000     4 WEEKS     1,000 - 30,000        FEMALE AND NON-PREGNANT FEMALE:     LESS THAN 5 mIU/mL   Novel Coronavirus, NAA (Hosp order, Send-out to Ref Lab; TAT 18-24 hrs     Status: None   Collection Time: 08/28/19 11:27 PM   Specimen: Nasopharyngeal Swab; Respiratory  Result Value Ref Range   SARS-CoV-2, NAA NOT DETECTED NOT DETECTED    Comment: (NOTE) This nucleic acid amplification test was developed and its performance characteristics determined by [employer]. Nucleic acid amplification tests include PCR and TMA. This test has not been FDA cleared or approved. This test has been authorized by FDA under an Emergency Use Authorization (EUA). This test is only authorized for the duration of time the declaration that circumstances exist justifying the authorization of the emergency use of in vitro diagnostic tests for detection of SARS-CoV-2 virus and/or diagnosis of COVID-19 infection under section 564(b)(1) of the Act, 21 U.S.C. [id number]) (1), unless the authorization is terminated or revoked sooner. When diagnostic testing is negative, the possibility of a false negative result should be considered in the context of a patient's recent exposures and the presence of clinical signs and symptoms consistent with COVID-19. An individual without symptoms of COVID- 19 and who is not shedding SARS-CoV-2 vi rus would expect to have a negative (not detected) result in this assay. Performed At: [facility] [place], [place] [id number] Rush Farmer MD [id number]    Coronavirus Source NASOPHARYNGEAL     Comment: Performed at [facility], [place] [address] [place], [place] [address]  Troponin I (High Sensitivity)     Status: Abnormal   Collection Time: 08/28/19  11:27 PM  Result Value Ref Range   Troponin I (High Sensitivity) 23 (H) <18 ng/L    Comment: (NOTE) Elevated high sensitivity troponin I (hsTnI) values and significant  changes across serial measurements may suggest ACS but many other  chronic and acute conditions are known to elevate hsTnI results.  Refer to the "Links" section for chest pain algorithms and additional  guidance. Performed at  [facility], [place] [address] [place], [place] [address]   Troponin I (High Sensitivity)     Status: Abnormal   Collection Time: 08/29/19  1:27 AM  Result Value Ref Range   Troponin I (High Sensitivity) 23 (H) <18 ng/L    Comment: (NOTE) Elevated high sensitivity troponin I (hsTnI) values and significant  changes across serial measurements may suggest ACS but many other  chronic and acute conditions are known to elevate hsTnI results.  Refer to the "Links" section for chest pain algorithms and additional  guidance. Performed at [facility], [place] [address], [place], [place] [address]   SARS CORONAVIRUS 2 (TAT 6-24 HRS) Nasopharyngeal Nasopharyngeal Swab     Status: None   Collection Time: 08/29/19  1:49 AM   Specimen: Nasopharyngeal Swab  Result Value Ref Range   SARS Coronavirus 2 NEGATIVE NEGATIVE    Comment: (NOTE) SARS-CoV-2 target nucleic acids are NOT DETECTED. The SARS-CoV-2 RNA is generally detectable in upper and lower respiratory specimens during the acute phase of infection. Negative results do not preclude SARS-CoV-2 infection, do not rule out co-infections with other pathogens, and should not be used as the sole basis for treatment or other patient management decisions. Negative results must be combined with clinical observations, patient history, and epidemiological information. The expected result is Negative. Fact Sheet for Patients: SugarRoll.be Fact Sheet for Healthcare Providers: https://www.woods-mathews.com/ This  test is not yet approved or cleared by the [place] FDA and  has been authorized for detection and/or diagnosis of SARS-CoV-2 by FDA under an Emergency Use Authorization (EUA). This EUA will remain  in effect (meaning this test can be used) for the duration of the COVID-19 declaration under Section 56 4(b)(1) of the Act, 21 U.S.C. section 360bbb-3(b)(1), unless the authorization is terminated or revoked sooner. Performed at [facility], [place] [address] [place], [place] [address]   Troponin I (High Sensitivity)     Status: Abnormal   Collection Time: 08/29/19  2:54 AM  Result Value Ref Range   Troponin I (High Sensitivity) 22 (H) <18 ng/L    Comment: (NOTE) Elevated high sensitivity troponin I (hsTnI) values and significant  changes across serial measurements may suggest ACS but many other  chronic and acute conditions are known to elevate hsTnI results.  Refer to the "Links" section for chest pain algorithms and additional  guidance. Performed at [facility], [place] [address], [place], [place] [address]   D-dimer, quantitative (not at [facility])     Status: Abnormal   Collection Time: 08/29/19  2:54 AM  Result Value Ref Range   D-Dimer, Quant 0.94 (H) 0.00 - 0.50 ug/mL-FEU    Comment: (NOTE) At the manufacturer cut-off of 0.50 ug/mL FEU, this assay has been documented to exclude PE with a sensitivity and negative predictive value of 97 to 99%.  At this time, this assay has not been approved by the FDA to exclude DVT/VTE. Results should be correlated with clinical presentation. Performed at [facility], [place] [address], [place], [place] [address]   HIV antibody (Routine Testing)     Status: None   Collection Time: 08/29/19  5:55 AM  Result Value Ref Range   HIV Screen 4th Generation wRfx Non Reactive Non Reactive    Comment: (NOTE) Performed At: [facility] [place], [place] [id number] Rush Farmer MD [id number]    Comprehensive metabolic panel     Status: Abnormal   Collection Time:  08/29/19  5:55 AM  Result Value Ref Range   Sodium 137 135 - 145 mmol/L   Potassium 4.2 3.5 - 5.1 mmol/L   Chloride 105 98 - 111 mmol/L   CO2 24 22 - 32 mmol/L   Glucose, Bld 112 (H) 70 - 99 mg/dL   BUN 18 6 - 20 mg/dL   Creatinine, Ser 1.23 (H) 0.44 - 1.00 mg/dL   Calcium 8.9 8.9 - 10.3 mg/dL   Total Protein 6.7 6.5 - 8.1 g/dL   Albumin 3.5 3.5 - 5.0 g/dL   AST 20 15 - 41 U/L   ALT 18 0 - 44 U/L   Alkaline Phosphatase 76 38 - 126 U/L   Total Bilirubin 0.3 0.3 - 1.2 mg/dL   GFR calc non Af Amer 48 (L) >60 mL/min   GFR calc Af Amer 56 (L) >60 mL/min   Anion gap 8 5 - 15    Comment: Performed at [facility] [address], [place], [place] [address]  CBG monitoring, ED     Status: None   Collection Time: 08/29/19  6:03 AM  Result Value Ref Range   Glucose-Capillary 99 70 - 99 mg/dL  Heparin level (unfractionated)     Status: None   Collection Time: 08/29/19 10:08 AM  Result Value Ref Range   Heparin Unfractionated 0.57 0.30 - 0.70 IU/mL    Comment: (NOTE) If heparin results are below expected values, and patient dosage has  been confirmed, suggest follow up testing of antithrombin III levels. Performed at [facility], [place] [address], [place], [place] [address]   CBG monitoring, ED     Status: None   Collection Time: 08/29/19 12:04 PM  Result Value Ref Range   Glucose-Capillary 90 70 - 99 mg/dL  CBC     Status: Abnormal   Collection Time: 08/29/19  3:31 PM  Result Value Ref Range   WBC 7.5 4.0 - 10.5 K/uL   RBC 4.39 3.87 - 5.11 MIL/uL   Hemoglobin 11.5 (L) 12.0 - 15.0 g/dL   HCT 37.8 36.0 - 46.0 %   MCV 86.1 80.0 - 100.0 fL   MCH 26.2 26.0 - 34.0 pg   MCHC 30.4 30.0 - 36.0 g/dL   RDW 14.4 11.5 - 15.5 %   Platelets 229 150 - 400 K/uL   nRBC 0.0 0.0 - 0.2 %    Comment: Performed at [facility], [place] [address] [place], [place] [address]  Heparin level (unfractionated)      Status: None   Collection Time: 08/29/19  3:31 PM  Result Value Ref Range   Heparin Unfractionated 0.40 0.30 - 0.70 IU/mL    Comment: (NOTE) If heparin results are below expected values, and patient dosage has  been confirmed, suggest follow up testing of antithrombin III levels. Performed at [facility], [place] [address] [place], [place] [address]   Glucose, capillary     Status: None   Collection Time: 08/29/19  4:14 PM  Result Value Ref Range   Glucose-Capillary 79 70 - 99 mg/dL   Comment 1 Notify RN    Comment 2 Document in Chart   Glucose, capillary     Status: Abnormal   Collection Time: 08/29/19  9:36 PM  Result Value Ref Range   Glucose-Capillary 127 (H) 70 - 99 mg/dL  Glucose, capillary     Status: Abnormal   Collection Time: 08/30/19 12:24 AM  Result Value Ref Range   Glucose-Capillary  102 (H) 70 - 99 mg/dL  Heparin level (unfractionated)     Status: None   Collection Time: 08/30/19  4:09 AM  Result Value Ref Range   Heparin Unfractionated 0.51 0.30 - 0.70 IU/mL    Comment: (NOTE) If heparin results are below expected values, and patient dosage has  been confirmed, suggest follow up testing of antithrombin III levels. Performed at [facility], [place] [address] [place], [place] [address]   CBC     Status: Abnormal   Collection Time: 08/30/19  4:09 AM  Result Value Ref Range   WBC 6.8 4.0 - 10.5 K/uL   RBC 3.81 (L) 3.87 - 5.11 MIL/uL   Hemoglobin 10.3 (L) 12.0 - 15.0 g/dL   HCT 32.5 (L) 36.0 - 46.0 %   MCV 85.3 80.0 - 100.0 fL   MCH 27.0 26.0 - 34.0 pg   MCHC 31.7 30.0 - 36.0 g/dL   RDW 14.6 11.5 - 15.5 %   Platelets 216 150 - 400 K/uL   nRBC 0.0 0.0 - 0.2 %    Comment: Performed at [facility], [place] [address] [place], [place] [address]  Glucose, capillary     Status: None   Collection Time: 08/30/19  5:48 AM  Result Value Ref Range   Glucose-Capillary 94 70 - 99 mg/dL  Basic metabolic panel     Status: Abnormal   Collection Time:  08/30/19  9:42 AM  Result Value Ref Range   Sodium 139 135 - 145 mmol/L   Potassium 4.4 3.5 - 5.1 mmol/L   Chloride 106 98 - 111 mmol/L   CO2 25 22 - 32 mmol/L   Glucose, Bld 132 (H) 70 - 99 mg/dL   BUN 15 6 - 20 mg/dL   Creatinine, Ser 1.17 (H) 0.44 - 1.00 mg/dL   Calcium 9.1 8.9 - 10.3 mg/dL   GFR calc non Af Amer 51 (L) >60 mL/min   GFR calc Af Amer 59 (L) >60 mL/min   Anion gap 8 5 - 15    Comment: Performed at [facility], [place] [address] [place], [place] [address]  Glucose, capillary     Status: Abnormal   Collection Time: 08/30/19 12:13 PM  Result Value Ref Range   Glucose-Capillary 110 (H) 70 - 99 mg/dL  ECHOCARDIOGRAM COMPLETE     Status: None   Collection Time: 08/30/19 12:18 PM  Result Value Ref Range   Weight 4,051.2 oz   Height 62 in   BP 164/109 mmHg  Glucose, capillary     Status: None   Collection Time: 08/30/19  5:08 PM  Result Value Ref Range   Glucose-Capillary 89 70 - 99 mg/dL  Glucose, capillary     Status: Abnormal   Collection Time: 08/30/19  8:01 PM  Result Value Ref Range   Glucose-Capillary 130 (H) 70 - 99 mg/dL  Glucose, capillary     Status: Abnormal   Collection Time: 08/31/19 12:18 AM  Result Value Ref Range   Glucose-Capillary 106 (H) 70 - 99 mg/dL  Glucose, capillary     Status: None   Collection Time: 08/31/19  3:43 AM  Result Value Ref Range   Glucose-Capillary 98 70 - 99 mg/dL  Glucose, capillary     Status: Abnormal   Collection Time: 08/31/19  7:22 AM  Result Value Ref Range   Glucose-Capillary 101 (H) 70 - 99 mg/dL  SARS Coronavirus 2 by RT PCR (hospital order, performed in [place] hospital  lab) Nasopharyngeal Nasopharyngeal Swab     Status: None   Collection Time: 10/16/19  5:53 PM   Specimen: Nasopharyngeal Swab  Result Value Ref Range   SARS Coronavirus 2 NEGATIVE NEGATIVE    Comment: (NOTE) If result is NEGATIVE SARS-CoV-2 target nucleic acids are NOT DETECTED. The SARS-CoV-2 RNA is generally detectable in upper  and lower  respiratory specimens during the acute phase of infection. The lowest  concentration of SARS-CoV-2 viral copies this assay can detect is 250  copies / mL. A negative result does not preclude SARS-CoV-2 infection  and should not be used as the sole basis for treatment or other  patient management decisions.  A negative result may occur with  improper specimen collection / handling, submission of specimen other  than nasopharyngeal swab, presence of viral mutation(s) within the  areas targeted by this assay, and inadequate number of viral copies  (<250 copies / mL). A negative result must be combined with clinical  observations, patient history, and epidemiological information. If result is POSITIVE SARS-CoV-2 target nucleic acids are DETECTED. The SARS-CoV-2 RNA is generally detectable in upper and lower  respiratory specimens dur ing the acute phase of infection.  Positive  results are indicative of active infection with SARS-CoV-2.  Clinical  correlation with patient history and other diagnostic information is  necessary to determine patient infection status.  Positive results do  not rule out bacterial infection or co-infection with other viruses. If result is PRESUMPTIVE POSTIVE SARS-CoV-2 nucleic acids MAY BE PRESENT.   A presumptive positive result was obtained on the submitted specimen  and confirmed on repeat testing.  While 2019 novel coronavirus  (SARS-CoV-2) nucleic acids may be present in the submitted sample  additional confirmatory testing may be necessary for epidemiological  and / or clinical management purposes  to differentiate between  SARS-CoV-2 and other Sarbecovirus currently known to infect humans.  If clinically indicated additional testing with an alternate test  methodology [phone]) is advised. The SARS-CoV-2 RNA is generally  detectable in upper and lower respiratory sp ecimens during the acute  phase of infection. The expected result is  Negative. Fact Sheet for Patients:  StrictlyIdeas.no Fact Sheet for Healthcare Providers: BankingDealers.co.za This test is not yet approved or cleared by the [place] FDA and has been authorized for detection and/or diagnosis of SARS-CoV-2 by FDA under an Emergency Use Authorization (EUA).  This EUA will remain in effect (meaning this test can be used) for the duration of the COVID-19 declaration under Section 564(b)(1) of the Act, 21 U.S.C. section 360bbb-3(b)(1), unless the authorization is terminated or revoked sooner. Performed at [facility], [place] [address], [place], [place] [address]   CBC     Status: Abnormal   Collection Time: 10/17/19  6:30 AM  Result Value Ref Range   WBC 7.1 4.0 - 10.5 K/uL   RBC 4.10 3.87 - 5.11 MIL/uL   Hemoglobin 11.0 (L) 12.0 - 15.0 g/dL   HCT 36.1 36.0 - 46.0 %   MCV 88.0 80.0 - 100.0 fL   MCH 26.8 26.0 - 34.0 pg   MCHC 30.5 30.0 - 36.0 g/dL   RDW 14.4 11.5 - 15.5 %   Platelets 261 150 - 400 K/uL   nRBC 0.0 0.0 - 0.2 %    Comment: Performed at [facility], [place] [address], [place], [place] [address]  Comprehensive metabolic panel     Status: Abnormal   Collection Time: 10/17/19  6:30 AM  Result  Value Ref Range   Sodium 136 135 - 145 mmol/L   Potassium 4.3 3.5 - 5.1 mmol/L   Chloride 103 98 - 111 mmol/L   CO2 24 22 - 32 mmol/L   Glucose, Bld 160 (H) 70 - 99 mg/dL   BUN 22 (H) 6 - 20 mg/dL   Creatinine, Ser 1.47 (H) 0.44 - 1.00 mg/dL   Calcium 9.3 8.9 - 10.3 mg/dL   Total Protein 7.8 6.5 - 8.1 g/dL   Albumin 3.8 3.5 - 5.0 g/dL   AST 23 15 - 41 U/L   ALT 24 0 - 44 U/L   Alkaline Phosphatase 89 38 - 126 U/L   Total Bilirubin 0.6 0.3 - 1.2 mg/dL   GFR calc non Af Amer 39 (L) >60 mL/min   GFR calc Af Amer 45 (L) >60 mL/min   Anion gap 9 5 - 15    Comment: Performed at [facility], [place] [address] [place], [place] [address]   Hemoglobin A1c     Status: Abnormal   Collection Time: 10/17/19  6:30 AM  Result Value Ref Range   Hgb A1c MFr Bld 6.2 (H) 4.8 - 5.6 %    Comment: (NOTE) Pre diabetes:          5.7%-6.4% Diabetes:              >6.4% Glycemic control for   <7.0% adults with diabetes    Mean Plasma Glucose 131.24 mg/dL    Comment: Performed at [facility] [address] [place], [place] [address]  Ethanol     Status: None   Collection Time: 10/17/19  6:30 AM  Result Value Ref Range   Alcohol, Ethyl (B) <10 <10 mg/dL    Comment: (NOTE) Lowest detectable limit for serum alcohol is 10 mg/dL. For medical purposes only. Performed at [facility], [place] [address], [place], [place] [address]   Lipid panel     Status: None   Collection Time: 10/17/19  6:30 AM  Result Value Ref Range   Cholesterol 153 0 - 200 mg/dL   Triglycerides 89 <150 mg/dL   HDL 59 >40 mg/dL   Total CHOL/HDL Ratio 2.6 RATIO   VLDL 18 0 - 40 mg/dL   LDL Cholesterol 76 0 - 99 mg/dL    Comment:        Total Cholesterol/HDL:CHD Risk Coronary Heart Disease Risk Table                     Men   Women  1/2 Average Risk   3.4   3.3  Average Risk       5.0   4.4  2 X Average Risk   9.6   7.1  3 X Average Risk  23.4   11.0        Use the calculated Patient Ratio above and the CHD Risk Table to determine the patient's CHD Risk.        ATP III CLASSIFICATION (LDL):  <100     mg/dL   Optimal  100-129  mg/dL   Near or Above                    Optimal  130-159  mg/dL   Borderline  160-189  mg/dL   High  >190     mg/dL   Very High Performed at [facility] [address], [place], [place] [address]   TSH  Status: None   Collection Time: 10/17/19  6:30 AM  Result Value Ref Range   TSH 2.644 0.350 - 4.500 uIU/mL    Comment: Performed by a 3rd Generation assay with a functional sensitivity of <=0.01 uIU/mL. Performed at [facility], [place] [address],  [place], [place] [address]   Prolactin     Status: None   Collection Time: 10/17/19  6:30 AM  Result Value Ref Range   Prolactin 18.1 4.8 - 23.3 ng/mL    Comment: (NOTE) Performed At: [facility] [place], [place] [id number] Rush Farmer MD [id number]       Psychiatric Specialty Exam: Physical Exam  ROS  Last menstrual period 02/02/2017.There is no height or weight on file to calculate BMI.  General Appearance: NA  Eye Contact:  NA  Speech:  Clear and Coherent and Slow  Volume:  Decreased  Mood:  Euthymic  Affect:  NA  Thought Process:  Goal Directed  Orientation:  Full (Time, Place, and Person)  Thought Content:  Logical  Suicidal Thoughts:  No  Homicidal Thoughts:  No  Memory:  Immediate;   Good Recent;   Good Remote;   Good  Judgement:  Good  Insight:  Good  Psychomotor Activity:  NA  Concentration:  Concentration: Fair and Attention Span: Fair  Recall:  Good  Fund of Knowledge:  Good  Language:  Good  Akathisia:  No  Handed:  Right  AIMS (if indicated):     Assets:  Communication Skills Desire for Improvement Housing Resilience Social Support  ADL's:  Intact  Cognition:  WNL  Sleep:   ok      Assessment and Plan: Schizophrenia chronic paranoid type.  Anxiety.  I review records from last ER visit, blood work and current medication.  Her hemoglobin A1c is 6.2.  Her creatinine is 1.47.  Her blood alcohol level was less than 10.  She is now taking Haldol 5 mg which is actually helping her and she mention her paranoia is almost gone.  She is getting along with the family member.  Her only concern is muscle stiffness and anxiety.  I recommend to keep the Haldol 5 mg daily and add low-dose hydroxyzine to help her anxiety and EPS.  We will also schedule appointment to see a therapist in the office.  I recommended to call us back if she has any question or any concern.  Follow-up in 2 months.  Time spent 25 minutes.  More than 50% of the time  spent in psychoeducation, counseling, coronation of care and reviewing her records.  Follow Up Instructions:    I discussed the assessment and treatment plan with the patient. The patient was provided an opportunity to ask questions and all were answered. The patient agreed with the plan and demonstrated an understanding of the instructions.   The patient was advised to call back or seek an in-person evaluation if the symptoms worsen or if the condition fails to improve as anticipated.  I provided 25 minutes of non-face-to-face time during this encounter.   Kathlee Nations, MD

## 2019-11-14 ENCOUNTER — Ambulatory Visit (INDEPENDENT_AMBULATORY_CARE_PROVIDER_SITE_OTHER): Payer: Medicare HMO | Admitting: Family Medicine

## 2019-11-14 ENCOUNTER — Ambulatory Visit (INDEPENDENT_AMBULATORY_CARE_PROVIDER_SITE_OTHER): Payer: Medicare HMO

## 2019-11-14 ENCOUNTER — Encounter: Payer: Self-pay | Admitting: Family Medicine

## 2019-11-14 ENCOUNTER — Other Ambulatory Visit: Payer: Self-pay

## 2019-11-14 VITALS — BP 120/76 | HR 98 | Temp 98.3°F | Wt 254.0 lb

## 2019-11-14 DIAGNOSIS — R7309 Other abnormal glucose: Secondary | ICD-10-CM | POA: Diagnosis not present

## 2019-11-14 DIAGNOSIS — F200 Paranoid schizophrenia: Secondary | ICD-10-CM | POA: Diagnosis not present

## 2019-11-14 DIAGNOSIS — M25462 Effusion, left knee: Secondary | ICD-10-CM | POA: Diagnosis not present

## 2019-11-14 DIAGNOSIS — I10 Essential (primary) hypertension: Secondary | ICD-10-CM

## 2019-11-14 DIAGNOSIS — R69 Illness, unspecified: Secondary | ICD-10-CM | POA: Diagnosis not present

## 2019-11-14 DIAGNOSIS — M25562 Pain in left knee: Secondary | ICD-10-CM | POA: Diagnosis not present

## 2019-11-14 DIAGNOSIS — J4521 Mild intermittent asthma with (acute) exacerbation: Secondary | ICD-10-CM | POA: Diagnosis not present

## 2019-11-14 NOTE — Patient Instructions (Addendum)
We will get x-rays of your left knee today.  You can use Voltaren gel on your knee.  It can be found over-the-counter at your local drugstore.  You can also use ice and/or heat on your knee to help with discomfort.  It is okay to continue Tylenol if needed for your knee pain.  Acute Knee Pain, Adult Acute knee pain is sudden and may be caused by damage, swelling, or irritation of the muscles and tissues that support your knee. The injury may result from:  A fall.  An injury to your knee from twisting motions.  A hit to the knee.  Infection. Acute knee pain may go away on its own with time and rest. If it does not, your health care provider may order tests to find the cause of the pain. These may include:  Imaging tests, such as an X-ray, MRI, or ultrasound.  Joint aspiration. In this test, fluid is removed from the knee.  Arthroscopy. In this test, a lighted tube is inserted into the knee and an image is projected onto a TV screen.  Biopsy. In this test, a sample of tissue is removed from the body and studied under a microscope. Follow these instructions at home: Pay attention to any changes in your symptoms. Take these actions to relieve your pain. If you have a knee sleeve or brace:   Wear the sleeve or brace as told by your health care provider. Remove it only as told by your health care provider.  Loosen the sleeve or brace if your toes tingle, become numb, or turn cold and blue.  Keep the sleeve or brace clean.  If the sleeve or brace is not waterproof: ? Do not let it get wet. ? Cover it with a watertight covering when you take a bath or shower. Activity  Rest your knee.  Do not do things that cause pain or make pain worse.  Avoid high-impact activities or exercises, such as running, jumping rope, or doing jumping jacks.  Work with a physical therapist to make a safe exercise program, as recommended by your health care provider. Do exercises as told by your physical  therapist. Managing pain, stiffness, and swelling   If directed, put ice on the knee: ? Put ice in a plastic bag. ? Place a towel between your skin and the bag. ? Leave the ice on for 20 minutes, 2-3 times a day.  If directed, use an elastic bandage to put pressure (compression) on your injured knee. This may control swelling, give support, and help with discomfort. General instructions  Take over-the-counter and prescription medicines only as told by your health care provider.  Raise (elevate) your knee above the level of your heart when you are sitting or lying down.  Sleep with a pillow under your knee.  Do not use any products that contain nicotine or tobacco, such as cigarettes, e-cigarettes, and chewing tobacco. These can delay healing. If you need help quitting, ask your health care provider.  If you are overweight, work with your health care provider and a dietitian to set a weight-loss goal that is healthy and reasonable for you. Extra weight can put pressure on your knee.  Keep all follow-up visits as told by your health care provider. This is important. Contact a health care provider if:  Your knee pain continues, changes, or gets worse.  You have a fever along with knee pain.  Your knee feels warm to the touch.  Your knee buckles or  locks up. Get help right away if:  Your knee swells, and the swelling becomes worse.  You cannot move your knee.  You have severe pain in your knee. Summary  Acute knee pain can be caused by a fall, an injury, an infection, or damage, swelling, or irritation of the tissues that support your knee.  Your health care provider may perform tests to find out the cause of the pain.  Pay attention to any changes in your symptoms. Relieve your pain with rest, medicines, light activity, and use of ice.  Get help if your pain continues or becomes worse, your knee swells, or you cannot move your knee. This information is not intended to  replace advice given to you by your health care provider. Make sure you discuss any questions you have with your health care provider. Document Released: 09/19/2007 Document Revised: 05/04/2018 Document Reviewed: 05/04/2018 Elsevier Patient Education  2020 [employer].  How to Use Cold Therapy Cold therapy, or cryotherapy, is a treatment that uses cold temperatures to treat an injury or medical condition. It includes using cold packs or ice packs to reduce pain and swelling. Only use cold therapy if your doctor says it is okay. What are the risks? Generally, cold therapy is a safe treatment. However, it is not safe for:  People who are not able to say they are in pain. These include small children and people who have memory problems.  People who have certain conditions, such as: ? A problem in the vessels that slows blood flow to the fingers and toes (Raynaud's syndrome). ? Feeling very cold easily (cold hypersensitivity). ? Lack of feeling in the area being iced. Cold therapy may not be safe for people who have other conditions. Do not use it without talking to your doctor if you have:  A heart condition.  High blood pressure.  Open or healing wounds.  An infection.  Pain and swelling in your joints (rheumatoid arthritis).  Poor blood flow in the body.  Diabetes.  Certain skin conditions. How can I make a cold pack? When using a cold pack at home to reduce pain and swelling, you can use:  A silica gel cold pack that has been left in the freezer. You can buy this online or in stores.  A sealable plastic bag that has been filled with crushed ice.  A washcloth or paper towels soaked in cold (or ice) water.  A plastic bag of frozen vegetables. Throw them away when you are finished using them as a cold pack. Supplies needed:  A cold pack.  A towel. This can be dry or damp, based on what you like. How to use cold therapy  1. Have your cold pack ready. 2. Place a towel  between the cold pack and your skin. You may also wrap the cold pack in a towel. 3. Put the cold pack on the affected area. Keep it on for no more than 20 minutes at a time. 4. Check your skin after 5 minutes to make sure that there is no damage to the area. Check for: ? White spots on your skin. Your skin may look blotchy or mottled. ? Skin that looks blue or pale. ? Skin that feels waxy or hard. 5. Repeat these steps as many times each day as told by your doctor. Always use a towel to avoid direct contact with your skin. Contact a doctor if:  You start to have white spots on your skin. This may give  your skin a blotchy or mottled look.  Your skin turns blue or pale.  Your skin becomes waxy or hard.  Your swelling gets worse. Summary  Cold therapy, or cryotherapy, is used to treat an injury or other conditions. It includes using cold packs or ice packs to reduce pain and swelling.  Cold therapy is not safe for people who are not able to say they are in pain.  When using cold packs or ice packs, always place a towel between the cold source and your skin.  Check your skin after 5 minutes of icing it. This is to make sure that there is no skin damage.  Contact your doctor if you notice changes in your skin or your swelling gets worse. This information is not intended to replace advice given to you by your health care provider. Make sure you discuss any questions you have with your health care provider. Document Released: 05/10/2008 Document Revised: 08/21/2018 Document Reviewed: 08/21/2018 Elsevier Patient Education  2020 [employer].

## 2019-11-14 NOTE — Progress Notes (Signed)
Subjective:    Patient ID: Maria Franklin, female    DOB: [birth year], 58 y.o.   MRN: [id number]  No chief complaint on file.   HPI Patient was seen today for acute concern.  Pt with L knee pain x 5-6 wks. patient notes left kneecap feels "like he is is going to pop".  Patient also notes knee feels unstable.  Pt now having difficulty going up steps.  Knee feels tight causing patient's walk to change.  Pt also has pain into her shin.  Pt has tried icy roll on medication, Motrin, Tylenol.  Pt notes feeling increased stiffness in body x a few wks.  Attributes this to an increase in her Haldol dose to 5 mg for paranoid schizophrenia.  Pt taking hydroxyzine to help with this.    HTN has improved since last OFV.  Past Medical History:  Diagnosis Date  . Allergy   . Anemia   . Angio-edema   . Asthma    ALL THE TIME   LAST FLARE UP 12/2018  . Chest pain 08/2019  . Chicken pox   . Diabetes mellitus without complication ([place])   . GERD (gastroesophageal reflux disease)   . Hyperlipidemia   . Hypertension   . Hyperthyroidism   . Recurrent upper respiratory infection (URI)   . Schizophrenia ([place])   . Sleep apnea    DX 2-3 YRS AGO.  Marland Kitchen Thyroid disease   . Urticaria     Allergies  Allergen Reactions  . Bee Pollen Cough  . Pollen Extract Cough  . Latex Itching, Rash and Other (See Comments)    Severe itching    ROS General: Denies fever, chills, night sweats, changes in weight, changes in appetite HEENT: Denies headaches, ear pain, changes in vision, rhinorrhea, sore throat CV: Denies CP, palpitations, SOB, orthopnea Pulm: Denies SOB, cough, wheezing GI: Denies abdominal pain, nausea, vomiting, diarrhea, constipation GU: Denies dysuria, hematuria, frequency, vaginal discharge Msk: Denies muscle cramps + L knee pain/stiffness Neuro: Denies weakness, numbness, tingling Skin: Denies rashes, bruising Psych: Denies depression, anxiety, hallucinations     Objective:    Blood pressure  120/76, pulse 98, temperature 98.3 F (36.8 C), temperature source Temporal, weight 254 lb (115.2 kg), last menstrual period 02/02/2017, SpO2 98 %.   Gen. Pleasant, well-nourished, in no distress, normal affect   HEENT: [place]/AT, face symmetric, no scleral icterus, PERRLA, EOMI, nares patent without drainage Lungs: no accessory muscle use Cardiovascular: RRR, no peripheral edema Musculoskeletal: No TTP of b/l knees at joint line, no crepitus, or patellar tracking abnormality.  L lateral knee with mild induration, no erythema, increased warmth. No effusion.  Negative anterior drawer, Lachman sign.  No deformities, no cyanosis or clubbing, normal tone Neuro:  A&Ox3, CN II-XII intact, normal gait Skin:  Warm, no lesions/ rash  Wt Readings from Last 3 Encounters:  09/27/19 247 lb 6.4 oz (112.2 kg)  09/13/19 252 lb (114.3 kg)  08/31/19 250 lb 12.8 oz (113.8 kg)    Lab Results  Component Value Date   WBC 7.1 10/17/2019   HGB 11.0 (L) 10/17/2019   HCT 36.1 10/17/2019   PLT 261 10/17/2019   GLUCOSE 160 (H) 10/17/2019   CHOL 153 10/17/2019   TRIG 89 10/17/2019   HDL 59 10/17/2019   LDLDIRECT 97.1 01/14/2014   LDLCALC 76 10/17/2019   ALT 24 10/17/2019   AST 23 10/17/2019   NA 136 10/17/2019   K 4.3 10/17/2019   CL 103 10/17/2019   CREATININE 1.47 (  H) 10/17/2019   BUN 22 (H) 10/17/2019   CO2 24 10/17/2019   TSH 2.644 10/17/2019   HGBA1C 6.2 (H) 10/17/2019   MICROALBUR 4.2 (H) 07/25/2019    Assessment/Plan:  Acute pain of left knee -discussed supportive care: tylenol, heat, ice, rest, elevation -discussed weight loss -Pt to try OTC Voltaren gel -will obtain imaging.  -consider referral to Ortho based on results.  - Plan: DG Knee Complete 4 Views Left  Essential hypertension -controlled -continue coreg 25 mg BID, hydralazine 25 mg TID -continue lifestyle modifications  Paranoid schizophrenia (HCC) -stable -haldol increased after recent admission 10/16/19 -continue haldol  5 mg daily, hydroxyzine 25 mg -continue f/u with Psychiatry, Dr. Adele Schilder  F/u prn  Grier Mitts, MD

## 2019-11-20 ENCOUNTER — Other Ambulatory Visit: Payer: Self-pay

## 2019-11-20 DIAGNOSIS — M25562 Pain in left knee: Secondary | ICD-10-CM

## 2019-11-21 DIAGNOSIS — E039 Hypothyroidism, unspecified: Secondary | ICD-10-CM | POA: Diagnosis not present

## 2019-11-21 DIAGNOSIS — Z6841 Body Mass Index (BMI) 40.0 and over, adult: Secondary | ICD-10-CM | POA: Diagnosis not present

## 2019-11-21 DIAGNOSIS — E042 Nontoxic multinodular goiter: Secondary | ICD-10-CM | POA: Diagnosis not present

## 2019-11-21 DIAGNOSIS — R7309 Other abnormal glucose: Secondary | ICD-10-CM | POA: Diagnosis not present

## 2019-11-21 DIAGNOSIS — E2601 Conn's syndrome: Secondary | ICD-10-CM | POA: Diagnosis not present

## 2019-11-21 DIAGNOSIS — I10 Essential (primary) hypertension: Secondary | ICD-10-CM | POA: Diagnosis not present

## 2019-11-28 ENCOUNTER — Other Ambulatory Visit: Payer: Self-pay

## 2019-11-28 ENCOUNTER — Ambulatory Visit
Admission: RE | Admit: 2019-11-28 | Discharge: 2019-11-28 | Disposition: A | Discharge status: Home / Self Care | Payer: Medicare HMO | Source: Ambulatory Visit | Attending: Family Medicine | Admitting: Family Medicine

## 2019-11-28 DIAGNOSIS — Z1231 Encounter for screening mammogram for malignant neoplasm of breast: Secondary | ICD-10-CM | POA: Diagnosis not present

## 2019-11-28 DIAGNOSIS — M25562 Pain in left knee: Secondary | ICD-10-CM | POA: Diagnosis not present

## 2019-12-10 DIAGNOSIS — M25562 Pain in left knee: Secondary | ICD-10-CM | POA: Diagnosis not present

## 2019-12-12 ENCOUNTER — Other Ambulatory Visit: Payer: Self-pay

## 2019-12-12 DIAGNOSIS — I10 Essential (primary) hypertension: Secondary | ICD-10-CM

## 2019-12-12 MED ORDER — HYDRALAZINE HCL 25 MG PO TABS: 25.0000 mg | ORAL_TABLET | Freq: Two times a day (BID) | ORAL | 0 refills | Status: DC

## 2019-12-13 ENCOUNTER — Ambulatory Visit: Payer: Medicare HMO | Admitting: Family Medicine

## 2019-12-15 DIAGNOSIS — J4521 Mild intermittent asthma with (acute) exacerbation: Secondary | ICD-10-CM | POA: Diagnosis not present

## 2019-12-19 DIAGNOSIS — M1712 Unilateral primary osteoarthritis, left knee: Secondary | ICD-10-CM | POA: Diagnosis not present

## 2019-12-24 ENCOUNTER — Ambulatory Visit (HOSPITAL_COMMUNITY): Payer: Medicare HMO | Admitting: Psychiatry

## 2019-12-26 ENCOUNTER — Ambulatory Visit: Payer: Medicare HMO | Admitting: Pulmonary Disease

## 2020-01-01 DIAGNOSIS — M25562 Pain in left knee: Secondary | ICD-10-CM | POA: Diagnosis not present

## 2020-01-09 DIAGNOSIS — M25562 Pain in left knee: Secondary | ICD-10-CM | POA: Diagnosis not present

## 2020-01-11 ENCOUNTER — Telehealth (INDEPENDENT_AMBULATORY_CARE_PROVIDER_SITE_OTHER): Payer: Medicare HMO | Admitting: Family Medicine

## 2020-01-11 DIAGNOSIS — I10 Essential (primary) hypertension: Secondary | ICD-10-CM | POA: Diagnosis not present

## 2020-01-11 NOTE — Progress Notes (Signed)
Virtual Visit via Video Note  I connected with Maria Franklin on 01/11/20 at  1:00 PM EST by a video enabled telemedicine application 2/2 [id number] pandemic and verified that I am speaking with the correct person using two identifiers.  Location patient: home Location provider:work or home office Persons participating in the virtual visit: patient, provider  I discussed the limitations of evaluation and management by telemedicine and the availability of in person appointments. The patient expressed understanding and agreed to proceed.   HPI: Pt seen for f/u on HTN.  States BP is going up again.  158/102, 152/91, 147/97, 145/93, 144/90.  Pt does not recall any changes in meds or foods.  States wasn't checking bp regularly, but starting having HAs while sleeping.  Taking 2nd dose of meds at 3 pm.  Pt notes increased stress as she is buying a house.  Resting and exercising to reduce stress.  Pt started PT for her knee.  In the past pt was on CPAP for OSA, but at last sleep study was advised she did not need the CPAP.  ROS: See pertinent positives and negatives per HPI.  Past Medical History:  Diagnosis Date  . Allergy   . Anemia   . Angio-edema   . Asthma    ALL THE TIME   LAST FLARE UP 12/2018  . Chest pain 08/2019  . Chicken pox   . Diabetes mellitus without complication ([place])   . GERD (gastroesophageal reflux disease)   . Hyperlipidemia   . Hypertension   . Hyperthyroidism   . Recurrent upper respiratory infection (URI)   . Schizophrenia ([place])   . Sleep apnea    DX 2-3 YRS AGO.  Marland Kitchen Thyroid disease   . Urticaria     Past Surgical History:  Procedure Laterality Date  . left adrenal gland removal  07/2019  . SINOSCOPY    . SINUS ENDO W/FUSION Bilateral 01/05/2019   Procedure: ENDOSCOPIC SINUS SURGERY WITH NAVIGATION;  Surgeon: Jerrell Belfast, MD;  Location: [facility];  Service: ENT;  Laterality: Bilateral;  . SINUS IRRIGATION    . TONSILLECTOMY    . TURBINATE REDUCTION N/A  01/05/2019   Procedure: Turbinate Reduction;  Surgeon: Jerrell Belfast, MD;  Location[facility];  Service: ENT;  Laterality: N/A;    Family History  Problem Relation Age of Onset  . Mental illness Mother   . Hypertension Mother   . Schizophrenia Mother   . Depression Mother   . Cancer Father        LUNG  . Cancer Sister        BREAST  . Breast cancer Sister   . Mental illness Brother   . Hypertension Brother   . Schizophrenia Brother   . Depression Brother   . Heart disease Maternal Grandmother   . Depression Maternal Grandmother   . Schizophrenia Maternal Grandmother   . Schizophrenia Brother   . Schizophrenia Maternal Uncle   . Breast cancer Maternal Aunt   . Heart attack Sister   . Heart attack Sister   . Heart attack Sister   . Allergic rhinitis Neg Hx   . Angioedema Neg Hx   . Asthma Neg Hx   . Atopy Neg Hx   . Eczema Neg Hx   . Immunodeficiency Neg Hx   . Urticaria Neg Hx      Current Outpatient Medications:  .  albuterol (VENTOLIN HFA) 108 (90 Base) MCG/ACT inhaler, INHALE ONE TO TWO PUFFS INTO LUNGS EVERY 6 HOURS AS NEEDED  FOR WHEEZING OR SHORTNESS OF BREATH, Disp: 8 g, Rfl: 5 .  aspirin EC 81 MG EC tablet, Take 1 tablet (81 mg total) by mouth daily., Disp: 30 tablet, Rfl: 0 .  atorvastatin (LIPITOR) 40 MG tablet, Take 1 tablet (40 mg total) by mouth daily at 6 PM., Disp: 90 tablet, Rfl: 3 .  budesonide-formoterol (SYMBICORT) 160-4.5 MCG/ACT inhaler, Inhale 2 puffs into the lungs 2 (two) times daily., Disp: 1 Inhaler, Rfl: 5 .  carvedilol (COREG) 25 MG tablet, Take 1 tablet (25 mg total) by mouth 2 (two) times daily with a meal., Disp: 180 tablet, Rfl: 3 .  Ferrous Sulfate (IRON) 325 (65 Fe) MG TABS, Take 1 tablet by mouth every morning., Disp: , Rfl:  .  fluticasone (FLONASE) 50 MCG/ACT nasal spray, Place 2 sprays into both nostrils 2 (two) times a day. (Patient taking differently: Place 2 sprays into both nostrils daily. ), Disp: 16 g, Rfl: 5 .  haloperidol  (HALDOL) 5 MG tablet, Take 1 tablet (5 mg total) by mouth at bedtime., Disp: 30 tablet, Rfl: 1 .  hydrALAZINE (APRESOLINE) 25 MG tablet, Take 1 tablet (25 mg total) by mouth 2 (two) times daily., Disp: 180 tablet, Rfl: 0 .  hydrOXYzine (VISTARIL) 25 MG capsule, Take 1 capsule (25 mg total) by mouth at bedtime., Disp: 30 capsule, Rfl: 1 .  levothyroxine (SYNTHROID, LEVOTHROID) 50 MCG tablet, Take 1 tablet (50 mcg total) by mouth daily before breakfast. MUST SCHEDULE ANNUAL PHYSICAL, Disp: 90 tablet, Rfl: 0 .  omeprazole (PRILOSEC) 40 MG capsule, Take 1 capsule (40 mg total) by mouth daily., Disp: 90 capsule, Rfl: 2  EXAM:  VITALS per patient if applicable:  RR [id number] bpm,  GENERAL: alert, oriented, appears well and in no acute distress  HEENT: atraumatic, conjunctiva clear, no obvious abnormalities on inspection of external nose and ears  NECK: normal movements of the head and neck  LUNGS: on inspection no signs of respiratory distress, breathing rate appears normal, no obvious gross SOB, gasping or wheezing  CV: no obvious cyanosis  MS: moves all visible extremities without noticeable abnormality  PSYCH/NEURO: pleasant and cooperative, no obvious depression or anxiety, speech and thought processing grossly intact  ASSESSMENT AND PLAN:  Discussed the following assessment and plan:  Essential hypertension -elevated -will increased hydralazine to 50 mg TID.  Pt just picked up 90 refill of 25 mg pills, so will take 2 pills TID. -continue coreg 25 mg BID -pt to increase po intake of water.  Discussed ways to reduce stress. -continue checking bp at home.  Will make further adjustments if needed. -f/u in 2 wks, sooner if needed    I discussed the assessment and treatment plan with the patient. The patient was provided an opportunity to ask questions and all were answered. The patient agreed with the plan and demonstrated an understanding of the instructions.   The patient was advised  to call back or seek an in-person evaluation if the symptoms worsen or if the condition fails to improve as anticipated.   Billie Ruddy, MD

## 2020-01-16 ENCOUNTER — Ambulatory Visit (INDEPENDENT_AMBULATORY_CARE_PROVIDER_SITE_OTHER): Payer: Medicare HMO | Admitting: Psychiatry

## 2020-01-16 ENCOUNTER — Other Ambulatory Visit: Payer: Self-pay

## 2020-01-16 ENCOUNTER — Encounter (HOSPITAL_COMMUNITY): Payer: Self-pay | Admitting: Psychiatry

## 2020-01-16 DIAGNOSIS — F200 Paranoid schizophrenia: Secondary | ICD-10-CM | POA: Diagnosis not present

## 2020-01-16 DIAGNOSIS — R69 Illness, unspecified: Secondary | ICD-10-CM | POA: Diagnosis not present

## 2020-01-16 MED ORDER — HYDRALAZINE HCL 25 MG PO TABS: 50.0000 mg | ORAL_TABLET | Freq: Three times a day (TID) | ORAL | 0 refills | Status: DC

## 2020-01-16 MED ORDER — HALOPERIDOL 5 MG PO TABS: ORAL_TABLET | ORAL | 2 refills | Status: DC

## 2020-01-16 NOTE — Progress Notes (Signed)
Virtual Visit via Telephone Note  I connected with Maria Franklin on 01/16/20 at  2:20 PM EST by telephone and verified that I am speaking with the correct person using two identifiers.   I discussed the limitations, risks, security and privacy concerns of performing an evaluation and management service by telephone and the availability of in person appointments. I also discussed with the patient that there may be a patient responsible charge related to this service. The patient expressed understanding and agreed to proceed.   History of Present Illness: Patient was evaluated by phone session.  She had cut down her Haldol to half tablet and taking 2.5 mg.  She decided reducing the dose on her own.  On the last visit we have added low-dose hydroxyzine to help for stiffness but patient stopped taking it.  She reported that since she does not need higher dose of Haldol she reduce the dose.  Patient appears somewhat guarded and not happy with the recommendation.  Patient was discharged from [facility] on Haldol 5 mg.  Patient told that she knows her body very well and does not feel she needed a higher dose.  Patient reported her sleep is good.  She has no issue, paranoia, irritability or any anger.  Her sleep is good.  Her son lives in [place] and she is in contact with him regular basis.  She lives with her husband who is supportive. Psychiatric History:Reviewed. H/Oparanoia and delusions.[facility]/O overdose [facility] overdose and [facility]Readmitted in 4 months due tononcompliant with medication.Last admission at [facility] in Nov 2020. Abilifyworked butcould not afford. Noh/o abuse.   Psychiatric Specialty Exam: Physical Exam  Review of Systems  Last menstrual period 02/02/2017.There is no height or weight on file to calculate BMI.  General Appearance: NA  Eye Contact:  NA  Speech:  Normal Rate  Volume:  Normal  Mood:   Euthymic  Affect:  NA  Thought Process:  Descriptions of Associations: Intact  Orientation:  Full (Time, Place, and Person)  Thought Content:  Rumination  Suicidal Thoughts:  No  Homicidal Thoughts:  No  Memory:  Immediate;   Good Recent;   Good Remote;   Good  Judgement:  Fair  Insight:  Present  Psychomotor Activity:  NA  Concentration:  Concentration: Fair and Attention Span: Fair  Recall:  [place] of Knowledge:  Good  Language:  Good  Akathisia:  No  Handed:  Right  AIMS (if indicated):     Assets:  Communication Skills Desire for Improvement Housing Social Support  ADL's:  Intact  Cognition:  WNL  Sleep:   ok      Assessment and Plan: Schizophrenia chronic paranoid type.  Patient has decided to cut down her Haldol to 2.5 mg on her own.  I explained risk of relapse with cutting down the dose but patient insist that she is doing well and like to keep the current dose.  I will discontinue hydroxyzine since patient is no longer taking it.  Recommended to call us back if she decided to see a therapist and we will schedule appointment.  Follow-up in 3 months.  Follow Up Instructions:    I discussed the assessment and treatment plan with the patient. The patient was provided an opportunity to ask questions and all were answered. The patient agreed with the plan and demonstrated an understanding of the instructions.   The patient was advised to call back or seek an  in-person evaluation if the symptoms worsen or if the condition fails to improve as anticipated.  I provided 20 minutes of non-face-to-face time during this encounter.   Kathlee Nations, MD

## 2020-01-18 DIAGNOSIS — E785 Hyperlipidemia, unspecified: Secondary | ICD-10-CM | POA: Diagnosis not present

## 2020-01-18 DIAGNOSIS — J449 Chronic obstructive pulmonary disease, unspecified: Secondary | ICD-10-CM | POA: Diagnosis not present

## 2020-01-18 DIAGNOSIS — Z6841 Body Mass Index (BMI) 40.0 and over, adult: Secondary | ICD-10-CM | POA: Diagnosis not present

## 2020-01-18 DIAGNOSIS — G8929 Other chronic pain: Secondary | ICD-10-CM | POA: Diagnosis not present

## 2020-01-18 DIAGNOSIS — E039 Hypothyroidism, unspecified: Secondary | ICD-10-CM | POA: Diagnosis not present

## 2020-01-18 DIAGNOSIS — I10 Essential (primary) hypertension: Secondary | ICD-10-CM | POA: Diagnosis not present

## 2020-01-18 DIAGNOSIS — E119 Type 2 diabetes mellitus without complications: Secondary | ICD-10-CM | POA: Diagnosis not present

## 2020-01-18 DIAGNOSIS — R69 Illness, unspecified: Secondary | ICD-10-CM | POA: Diagnosis not present

## 2020-01-18 DIAGNOSIS — J309 Allergic rhinitis, unspecified: Secondary | ICD-10-CM | POA: Diagnosis not present

## 2020-01-23 ENCOUNTER — Telehealth (INDEPENDENT_AMBULATORY_CARE_PROVIDER_SITE_OTHER): Payer: Medicare HMO | Admitting: Family Medicine

## 2020-01-23 DIAGNOSIS — I10 Essential (primary) hypertension: Secondary | ICD-10-CM

## 2020-01-23 DIAGNOSIS — M1712 Unilateral primary osteoarthritis, left knee: Secondary | ICD-10-CM | POA: Diagnosis not present

## 2020-01-23 MED ORDER — LISINOPRIL 40 MG PO TABS: 40.0000 mg | ORAL_TABLET | Freq: Every day | ORAL | 3 refills | Status: DC

## 2020-01-23 MED ORDER — HYDRALAZINE HCL 25 MG PO TABS: 12.5000 mg | ORAL_TABLET | Freq: Two times a day (BID) | ORAL | 0 refills | Status: DC

## 2020-01-23 MED ORDER — AMLODIPINE BESYLATE 10 MG PO TABS: 10.0000 mg | ORAL_TABLET | Freq: Every day | ORAL | 3 refills | Status: DC

## 2020-01-23 MED ORDER — CARVEDILOL 25 MG PO TABS: 12.5000 mg | ORAL_TABLET | Freq: Two times a day (BID) | ORAL | 3 refills | Status: DC

## 2020-01-23 NOTE — Progress Notes (Signed)
Virtual Visit via Video Note  I connected with Maria Franklin on 01/23/20 at  1:30 PM EST by a video enabled telemedicine application 2/2 [id number] pandemic and verified that I am speaking with the correct person using two identifiers.  Location patient: home Location provider:work or home office Persons participating in the virtual visit: patient, provider  I discussed the limitations of evaluation and management by telemedicine and the availability of in person appointments. The patient expressed understanding and agreed to proceed.   HPI: Pt states she is feeling better.  The increased hydralazine did not help.  Pt had a home health assessment with her insurance and they suggested decreasing sodium.  Pt feels like bp has become worse since her adrenal gland surgery.  She looked back in her chart to see what she was taking prior to surgery and added back norvasc and lisinopril.  Pt notes improvement in bp, but says stress has been making it go up.  Pt now taking: Levothyroxine 50 mcg, carvedilol 12.5 mg BID, hydralazine 12.5 mg BID, lisinopril 40 mg daily, atorvastatin 40 mg, omeprazole, norvasc 10 mg daily, haloperidol 2.5 mg qhs.  Also taking asthma medication and nasal spray.  BP has been 124/79, 126/86, 123/83, 120/92.    ROS: See pertinent positives and negatives per HPI.  Past Medical History:  Diagnosis Date  . Allergy   . Anemia   . Angio-edema   . Asthma    ALL THE TIME   LAST FLARE UP 12/2018  . Chest pain 08/2019  . Chicken pox   . Diabetes mellitus without complication ([place])   . GERD (gastroesophageal reflux disease)   . Hyperlipidemia   . Hypertension   . Hyperthyroidism   . Recurrent upper respiratory infection (URI)   . Schizophrenia ([place])   . Sleep apnea    DX 2-3 YRS AGO.  Marland Kitchen Thyroid disease   . Urticaria     Past Surgical History:  Procedure Laterality Date  . left adrenal gland removal  07/2019  . SINOSCOPY    . SINUS ENDO W/FUSION Bilateral 01/05/2019   Procedure: ENDOSCOPIC SINUS SURGERY WITH NAVIGATION;  Surgeon: Jerrell Belfast, MD;  Location: [facility];  Service: ENT;  Laterality: Bilateral;  . SINUS IRRIGATION    . TONSILLECTOMY    . TURBINATE REDUCTION N/A 01/05/2019   Procedure: Turbinate Reduction;  Surgeon: Jerrell Belfast, MD;  [facility];  Service: ENT;  Laterality: N/A;    Family History  Problem Relation Age of Onset  . Mental illness Mother   . Hypertension Mother   . Schizophrenia Mother   . Depression Mother   . Cancer Father        LUNG  . Cancer Sister        BREAST  . Breast cancer Sister   . Mental illness Brother   . Hypertension Brother   . Schizophrenia Brother   . Depression Brother   . Heart disease Maternal Grandmother   . Depression Maternal Grandmother   . Schizophrenia Maternal Grandmother   . Schizophrenia Brother   . Schizophrenia Maternal Uncle   . Breast cancer Maternal Aunt   . Heart attack Sister   . Heart attack Sister   . Heart attack Sister   . Allergic rhinitis Neg Hx   . Angioedema Neg Hx   . Asthma Neg Hx   . Atopy Neg Hx   . Eczema Neg Hx   . Immunodeficiency Neg Hx   . Urticaria Neg Hx  Current Outpatient Medications:  .  albuterol (VENTOLIN HFA) 108 (90 Base) MCG/ACT inhaler, INHALE ONE TO TWO PUFFS INTO LUNGS EVERY 6 HOURS AS NEEDED FOR WHEEZING OR SHORTNESS OF BREATH, Disp: 8 g, Rfl: 5 .  aspirin EC 81 MG EC tablet, Take 1 tablet (81 mg total) by mouth daily., Disp: 30 tablet, Rfl: 0 .  atorvastatin (LIPITOR) 40 MG tablet, Take 1 tablet (40 mg total) by mouth daily at 6 PM., Disp: 90 tablet, Rfl: 3 .  budesonide-formoterol (SYMBICORT) 160-4.5 MCG/ACT inhaler, Inhale 2 puffs into the lungs 2 (two) times daily., Disp: 1 Inhaler, Rfl: 5 .  carvedilol (COREG) 25 MG tablet, Take 1 tablet (25 mg total) by mouth 2 (two) times daily with a meal., Disp: 180 tablet, Rfl: 3 .  Ferrous Sulfate (IRON) 325 (65 Fe) MG TABS, Take 1 tablet by mouth every morning., Disp: , Rfl:  .   fluticasone (FLONASE) 50 MCG/ACT nasal spray, Place 2 sprays into both nostrils 2 (two) times a day. (Patient taking differently: Place 2 sprays into both nostrils daily. ), Disp: 16 g, Rfl: 5 .  haloperidol (HALDOL) 5 MG tablet, Take 1/2 tab at bed time, Disp: 15 tablet, Rfl: 2 .  hydrALAZINE (APRESOLINE) 25 MG tablet, Take 2 tablets (50 mg total) by mouth 3 (three) times daily., Disp: 180 tablet, Rfl: 0 .  levothyroxine (SYNTHROID, LEVOTHROID) 50 MCG tablet, Take 1 tablet (50 mcg total) by mouth daily before breakfast. MUST SCHEDULE ANNUAL PHYSICAL, Disp: 90 tablet, Rfl: 0 .  omeprazole (PRILOSEC) 40 MG capsule, Take 1 capsule (40 mg total) by mouth daily., Disp: 90 capsule, Rfl: 2  EXAM:  VITALS per patient if applicable: RR between 123456 bpm  GENERAL: alert, oriented, appears well and in no acute distress  HEENT: atraumatic, conjunctiva clear, no obvious abnormalities on inspection of external nose and ears  NECK: normal movements of the head and neck  LUNGS: on inspection no signs of respiratory distress, breathing rate appears normal, no obvious gross SOB, gasping or wheezing  CV: no obvious cyanosis  MS: moves all visible extremities without noticeable abnormality  PSYCH/NEURO: pleasant and cooperative, no obvious depression or anxiety, speech and thought processing grossly intact  ASSESSMENT AND PLAN:  Discussed the following assessment and plan:  Essential hypertension -improving  -ok with adding back lisinopril 40 mg and norvsac 10 mg -continue other medications: carvedilol 12.5 mg BID, hydralazine 12.5 mg BID -advised to continue lifestyle modifications and checking bp regularly - Plan: lisinopril (ZESTRIL) 40 MG tablet, amLODipine (NORVASC) 10 MG tablet    F/u in 1-2 months   I discussed the assessment and treatment plan with the patient. The patient was provided an opportunity to ask questions and all were answered. The patient agreed with the plan and demonstrated  an understanding of the instructions.   The patient was advised to call back or seek an in-person evaluation if the symptoms worsen or if the condition fails to improve as anticipated.   Billie Ruddy, MD

## 2020-01-25 ENCOUNTER — Telehealth: Payer: Medicare HMO | Admitting: Family Medicine

## 2020-02-14 ENCOUNTER — Ambulatory Visit: Payer: Self-pay | Admitting: Allergy & Immunology

## 2020-02-29 ENCOUNTER — Ambulatory Visit: Payer: Medicare HMO | Attending: Internal Medicine

## 2020-02-29 DIAGNOSIS — Z23 Encounter for immunization: Secondary | ICD-10-CM

## 2020-02-29 NOTE — Progress Notes (Signed)
   Covid-19 Vaccination Clinic  Name:  Maria Franklin    MRN: [id number] DOB: [birth year]  02/29/2020  Maria Franklin was observed post Covid-19 immunization for 15 minutes without incident. She was provided with Vaccine Information Sheet and instruction to access the V-Safe system.   Maria Franklin was instructed to call 911 with any severe reactions post vaccine: Marland Kitchen Difficulty breathing  . Swelling of face and throat  . A fast heartbeat  . A bad rash all over body  . Dizziness and weakness   Immunizations Administered    Name Date Dose VIS Date Route   Pfizer COVID-19 Vaccine 02/29/2020  9:48 AM 0.3 mL 11/16/2019 Intramuscular   Manufacturer: [place]   Lot: [id number]   [place]: [id number]

## 2020-03-22 ENCOUNTER — Ambulatory Visit: Payer: Medicare HMO | Attending: Internal Medicine

## 2020-03-22 DIAGNOSIS — Z23 Encounter for immunization: Secondary | ICD-10-CM

## 2020-03-22 NOTE — Progress Notes (Signed)
   Covid-19 Vaccination Clinic  Name:  SHERENA SCHWERTNER    MRN: [id number] DOB: March 15, 1961  03/22/2020  Ms. Hauth was observed post Covid-19 immunization for 15 minutes without incident. She was provided with Vaccine Information Sheet and instruction to access the V-Safe system.   Ms. Amborn was instructed to call 911 with any severe reactions post vaccine: Marland Kitchen Difficulty breathing  . Swelling of face and throat  . A fast heartbeat  . A bad rash all over body  . Dizziness and weakness   Immunizations Administered    Name Date Dose VIS Date Route   Pfizer COVID-19 Vaccine 03/22/2020 10:38 AM 0.3 mL 11/16/2019 Intramuscular   Manufacturer: [place]   Lot: [id number]   [place]: [id number]

## 2020-03-24 ENCOUNTER — Ambulatory Visit: Payer: Self-pay

## 2020-04-09 ENCOUNTER — Other Ambulatory Visit: Payer: Self-pay | Admitting: Family Medicine

## 2020-04-09 NOTE — Telephone Encounter (Signed)
Medication: Pharmacy:   Pt has questions and concerns about medication and possible refill.

## 2020-04-09 NOTE — Telephone Encounter (Signed)
She also needs another Rx refilled for 90 days  ADVAIR DISKUS [address]), [place] Phone:  [phone]  Fax:  [phone]     The patient is wanting to talk with Dr. Volanda Napoleon nurse about this Rx instead of sending messages.  Please advise

## 2020-04-10 ENCOUNTER — Other Ambulatory Visit: Payer: Self-pay

## 2020-04-10 DIAGNOSIS — I10 Essential (primary) hypertension: Secondary | ICD-10-CM

## 2020-04-10 MED ORDER — CARVEDILOL 25 MG PO TABS: 12.5000 mg | ORAL_TABLET | Freq: Two times a day (BID) | ORAL | 3 refills | Status: DC

## 2020-04-10 MED ORDER — HYDRALAZINE HCL 25 MG PO TABS: 12.5000 mg | ORAL_TABLET | Freq: Two times a day (BID) | ORAL | 0 refills | Status: DC

## 2020-04-10 NOTE — Telephone Encounter (Signed)
Spoke with pt aware that I spoke with her pharmacy this morning and a new Rx for carvedilol 12.5 mg and Hydralazine was sent for refills. Waiting of approval for pt Advair Rx from Dr Volanda Napoleon

## 2020-04-10 NOTE — Telephone Encounter (Signed)
Spoke with pt state that she took Rx(Advair)a few years back but due to change with her insurance was not able to afford paying for Rx, pt states that now she is on different health insurance and which covers Advair, Request for a new Rx to her pharmacy, ok to send

## 2020-04-11 ENCOUNTER — Telehealth: Payer: Self-pay | Admitting: Allergy & Immunology

## 2020-04-11 NOTE — Telephone Encounter (Signed)
Patient said she has been on Symbicort, but likes Advair better and was a requesting a prescription for Advair be sent to [facility] on [address].

## 2020-04-11 NOTE — Telephone Encounter (Signed)
Would like a 90 day supply, explained that the insurance may or may not cover the Advair diskus.  Pt does not like the Symbicort.  Please advise.

## 2020-04-14 ENCOUNTER — Encounter (HOSPITAL_COMMUNITY): Payer: Self-pay | Admitting: Psychiatry

## 2020-04-14 ENCOUNTER — Telehealth (INDEPENDENT_AMBULATORY_CARE_PROVIDER_SITE_OTHER): Payer: Medicare HMO | Admitting: Psychiatry

## 2020-04-14 ENCOUNTER — Other Ambulatory Visit: Payer: Self-pay

## 2020-04-14 DIAGNOSIS — F200 Paranoid schizophrenia: Secondary | ICD-10-CM

## 2020-04-14 DIAGNOSIS — F419 Anxiety disorder, unspecified: Secondary | ICD-10-CM

## 2020-04-14 DIAGNOSIS — R69 Illness, unspecified: Secondary | ICD-10-CM | POA: Diagnosis not present

## 2020-04-14 MED ORDER — FLUTICASONE-SALMETEROL 250-50 MCG/DOSE IN AEPB: 1.0000 | INHALATION_SPRAY | Freq: Two times a day (BID) | RESPIRATORY_TRACT | 1 refills | Status: DC

## 2020-04-14 NOTE — Addendum Note (Signed)
Addended by: Valere Dross on: 04/14/2020 11:46 AM   Modules accepted: Orders

## 2020-04-14 NOTE — Telephone Encounter (Signed)
Sure let's do the Advair 250/50 one puff twice daily.   Salvatore Marvel, MD [facility]

## 2020-04-14 NOTE — Progress Notes (Signed)
Virtual Visit via Telephone Note  I connected with Maria Franklin on 04/14/20 at  2:40 PM EDT by telephone and verified that I am speaking with the correct person using two identifiers.   I discussed the limitations, risks, security and privacy concerns of performing an evaluation and management service by telephone and the availability of in person appointments. I also discussed with the patient that there may be a patient responsible charge related to this service. The patient expressed understanding and agreed to proceed.   History of Present Illness: Patient is evaluated by phone session.  She is taking Haldol 2.5 mg and do not report any worsening of symptoms.  She sleeps good.  She denies any paranoia or any hallucination.  However she like to start therapy since she noticed when she went to work she get anxious and feeling overwhelmed.  She like to go back to work but like to see a therapist before she did that.  She has no tremors, shakes or any EPS.  She lives with her husband who is supportive.  Her son lives in [place].  She admitted not having blood work and physical in a while and like to do the blood work with her PCP soon.  Psychiatric History:Reviewed. H/Oparanoia and delusions.[facility] overdose [facility]O overdose and inpatient[facility].Readmitted in 4 months due tononcompliant with medication.Last admission at [facility] in Nov 2020.Abilifyworked butcould not afford. Noh/o abuse.   Psychiatric Specialty Exam: Physical Exam  Review of Systems  Last menstrual period 02/02/2017.There is no height or weight on file to calculate BMI.  General Appearance: NA  Eye Contact:  NA  Speech:  Slow  Volume:  Normal  Mood:  Euthymic  Affect:  NA  Thought Process:  Descriptions of Associations: Intact  Orientation:  Full (Time, Place, and Person)  Thought Content:  Logical  Suicidal Thoughts:  No  Homicidal Thoughts:  No   Memory:  Immediate;   Good Recent;   Good Remote;   Good  Judgement:  Fair  Insight:  Fair  Psychomotor Activity:  NA  Concentration:  Concentration: Fair and Attention Span: Fair  Recall:  Good  Fund of Knowledge:  Good  Language:  Good  Akathisia:  No  Handed:  Right  AIMS (if indicated):     Assets:  Communication Skills Desire for Improvement Housing Social Support  ADL's:  Intact  Cognition:  WNL  Sleep:   ok      Assessment and Plan: Schizophrenia chronic paranoid type.  Patient doing well on low-dose Haldol 2.5 mg.  She still have refills remaining and does not want to get a new prescription.  She like to schedule therapy to help her anxiety related to her job.  We will provide names and contact information of therapist since our therapist "I am not taking new patient.  I recommend to call us back if she has any questions or any concerns.  Follow-up in 3 months.  Follow Up Instructions:    I discussed the assessment and treatment plan with the patient. The patient was provided an opportunity to ask questions and all were answered. The patient agreed with the plan and demonstrated an understanding of the instructions.   The patient was advised to call back or seek an in-person evaluation if the symptoms worsen or if the condition fails to improve as anticipated.  I provided 15 minutes of non-face-to-face time during this encounter.   Kathlee Nations, MD

## 2020-04-14 NOTE — Telephone Encounter (Addendum)
Advair 250/50 was sent to [facility]. Patient was contacted and left message letting patient know we sent Rx to pharmacy.   PA for Advair 250/50 was initiated through [email or web address]. Awaiting questionnaire and approval

## 2020-04-15 NOTE — Telephone Encounter (Signed)
Pt seen by [facility].  It appears the rx was sent in by them.

## 2020-04-15 NOTE — Telephone Encounter (Signed)
Followed up on the status of the PA and it stated that the patient has access to this medication and a PA was not needed.

## 2020-04-16 NOTE — Telephone Encounter (Signed)
Spoke with pt state that the Advair was refilled by Allergy clinic

## 2020-04-29 ENCOUNTER — Ambulatory Visit (INDEPENDENT_AMBULATORY_CARE_PROVIDER_SITE_OTHER): Payer: Medicare HMO | Admitting: Allergy & Immunology

## 2020-04-29 ENCOUNTER — Encounter: Payer: Self-pay | Admitting: Allergy & Immunology

## 2020-04-29 ENCOUNTER — Other Ambulatory Visit: Payer: Self-pay

## 2020-04-29 DIAGNOSIS — J3089 Other allergic rhinitis: Secondary | ICD-10-CM | POA: Diagnosis not present

## 2020-04-29 DIAGNOSIS — J302 Other seasonal allergic rhinitis: Secondary | ICD-10-CM

## 2020-04-29 DIAGNOSIS — J339 Nasal polyp, unspecified: Secondary | ICD-10-CM | POA: Diagnosis not present

## 2020-04-29 DIAGNOSIS — J4540 Moderate persistent asthma, uncomplicated: Secondary | ICD-10-CM | POA: Diagnosis not present

## 2020-04-29 NOTE — Progress Notes (Signed)
RE: Maria Franklin MRN: [id number] DOB: Aug 22, 1961 Date of Telemedicine Visit: 04/29/2020  Referring provider: Billie Ruddy, MD Primary care provider: Billie Ruddy, MD  Chief Complaint: No chief complaint on file.   Telemedicine Follow Up Visit via Telephone: I connected with Maria Franklin for a follow up on 04/29/20 by telephone and verified that I am speaking with the correct person using two identifiers.   I discussed the limitations, risks, security and privacy concerns of performing an evaluation and management service by telephone and the availability of in person appointments. I also discussed with the patient that there may be a patient responsible charge related to this service. The patient expressed understanding and agreed to proceed.  Patient is at home.  Provider is at the office.  Visit start time: 8:38 AM Visit end time: 9:10 AM Insurance consent/check in by: [facility] consent and medical assistant/nurse: [place]  History of Present Illness:  She is a 59 y.o. female, who is being followed for moderate persistent asthma as well as S/PAR. Her previous allergy office visit was in November 2020 with myself. At that last visit, we continued with Symbicort two puffs BID as well as albuterol as needed. For her rhinitis, we continued with fluticasone as well as Allegra and Singulair. We also discussed adding on the [place] again for control of her nasal polyposis.   Since the last visit, she has mostly done well.   Asthma/Respiratory Symptom History: She is on the Advair which was sent in over the last two weeks. She was on the Symbicort but it was too expensive. Advair was expensive as well. She paid $90 for this. 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 She has not been using her rescue inhaler since getting on the Advair. Over the last couple of days, it has been acting up and she has sprayed it once daily over the last  couple of days. She is now back on her [employer]. She thinks that it will be $200 for the first inhaler and then $50 per month.   Allergic Rhinitis Symptom History: She has been having some problems with dust and mold. She has not had eye dswelling at all and has not been doing a lot of sneezing except when she cut grass. Overall she has been doing alright. Nasal polyps have been well controlled since her last surgery in 2020 (Dr. Wilburn Cornelia). She is on the fluticasone two sprays once per day. She does report being able to smell and taste.   She did get both of her COVID19 vaccinations. She did not have a reaction to the second one, she actually had a reaction including diarrhea and a few other symptoms with the first one. Symptoms lasted 24 hours and she was fine after that.   She did recently quit her most recent job with her anxiety was acting up.   Otherwise, there have been no changes to her past medical history, surgical history, family history, or social history.  Assessment and Plan:  Maria Franklin is a 59 y.o. female with:  Seasonal and perennial allergic rhinitis(grasses, weeds, trees, indoor and outdoor molds, dust mite, roach)  Moderate persistent asthma without complication   Adverse reaction to Dupixent - unclear causation  Nasal polyposis- s/pthreesinus surgeries (1990s, 2013, 2020)  Adverse food reactions (nuts, mushrooms) - with negative testing the past  Pruritus - controlled with emollients alone  Difficulty affording medications   Maria Franklin seems to be  doing well on the current regimen. We did change her from Symbicort to Advair to make things more affordable for her. She seems to be well controlled so we will just go with it. We constantly have to play around with her controllers since her Medicare does not seem to cover anything very well. Regardless, she is in a good place now. Allergic rhinitis is controlled with the use of the fluticasone as well as an  antihistamine as needed. We have discussed doing allergy shots but this has never worked with her schedule. We also trialed Dupixent, but she did not tolerate it all.   Diagnostics: None.  Medication List:  Current Outpatient Medications  Medication Sig Dispense Refill  . albuterol (VENTOLIN HFA) 108 (90 Base) MCG/ACT inhaler INHALE ONE TO TWO PUFFS INTO LUNGS EVERY 6 HOURS AS NEEDED FOR WHEEZING OR SHORTNESS OF BREATH 8 g 5  . amLODipine (NORVASC) 10 MG tablet Take 1 tablet (10 mg total) by mouth daily. 90 tablet 3  . aspirin EC 81 MG EC tablet Take 1 tablet (81 mg total) by mouth daily. 30 tablet 0  . atorvastatin (LIPITOR) 40 MG tablet Take 1 tablet (40 mg total) by mouth daily at 6 PM. 90 tablet 3  . budesonide-formoterol (SYMBICORT) 160-4.5 MCG/ACT inhaler Inhale 2 puffs into the lungs 2 (two) times daily. 1 Inhaler 5  . carvedilol (COREG) 25 MG tablet Take 0.5 tablets (12.5 mg total) by mouth 2 (two) times daily with a meal. 90 tablet 3  . Ferrous Sulfate (IRON) 325 (65 Fe) MG TABS Take 1 tablet by mouth every morning.    . fluticasone (FLONASE) 50 MCG/ACT nasal spray Place 2 sprays into both nostrils 2 (two) times a day. (Patient taking differently: Place 2 sprays into both nostrils daily. ) 16 g 5  . Fluticasone-Salmeterol (ADVAIR DISKUS) 250-50 MCG/DOSE AEPB Inhale 1 puff into the lungs 2 (two) times daily. 180 each 1  . haloperidol (HALDOL) 5 MG tablet Take 1/2 tab at bed time (Patient taking differently: 2.5 mg. Take 1/2 tab at bed time) 15 tablet 2  . hydrALAZINE (APRESOLINE) 25 MG tablet Take 0.5 tablets (12.5 mg total) by mouth in the morning and at bedtime. 180 tablet 0  . levothyroxine (SYNTHROID, LEVOTHROID) 50 MCG tablet Take 1 tablet (50 mcg total) by mouth daily before breakfast. MUST SCHEDULE ANNUAL PHYSICAL 90 tablet 0  . lisinopril (ZESTRIL) 40 MG tablet Take 1 tablet (40 mg total) by mouth daily. 90 tablet 3  . omeprazole (PRILOSEC) 40 MG capsule Take 1 capsule (40 mg  total) by mouth daily. 90 capsule 2   No current facility-administered medications for this visit.   Allergies: Allergies  Allergen Reactions  . Bee Pollen Cough  . Pollen Extract Cough  . Latex Itching, Rash and Other (See Comments)    Severe itching   I reviewed her past medical history, social history, family history, and environmental history and no significant changes have been reported from previous visits.  Review of Systems  Constitutional: Negative for activity change, appetite change, chills, fatigue and fever.  HENT: Negative for congestion, postnasal drip, rhinorrhea, sinus pressure and sore throat.   Eyes: Negative for pain, discharge, redness and itching.  Respiratory: Negative for shortness of breath, wheezing and stridor.   Gastrointestinal: Negative for diarrhea, nausea and vomiting.  Endocrine: Negative for cold intolerance and heat intolerance.  Musculoskeletal: Negative for arthralgias, joint swelling and myalgias.  Skin: Negative for rash.  Allergic/Immunologic: Negative for environmental allergies and  food allergies.    Objective:  Physical exam not obtained as encounter was done via telephone.   Previous notes and tests were reviewed.  I discussed the assessment and treatment plan with the patient. The patient was provided an opportunity to ask questions and all were answered. The patient agreed with the plan and demonstrated an understanding of the instructions.   The patient was advised to call back or seek an in-person evaluation if the symptoms worsen or if the condition fails to improve as anticipated.  I provided 32 minutes of non-face-to-face time during this encounter.  It was my pleasure to participate in [place] care today. Please feel free to contact me with any questions or concerns.   Sincerely,  Valentina Shaggy, MD

## 2020-07-15 ENCOUNTER — Encounter (HOSPITAL_COMMUNITY): Payer: Self-pay | Admitting: Psychiatry

## 2020-07-15 ENCOUNTER — Other Ambulatory Visit: Payer: Self-pay

## 2020-07-15 ENCOUNTER — Telehealth (INDEPENDENT_AMBULATORY_CARE_PROVIDER_SITE_OTHER): Payer: Medicare HMO | Admitting: Psychiatry

## 2020-07-15 DIAGNOSIS — F200 Paranoid schizophrenia: Secondary | ICD-10-CM | POA: Diagnosis not present

## 2020-07-15 DIAGNOSIS — R69 Illness, unspecified: Secondary | ICD-10-CM | POA: Diagnosis not present

## 2020-07-15 MED ORDER — HALOPERIDOL 5 MG PO TABS: 2.5000 mg | ORAL_TABLET | Freq: Every day | ORAL | 1 refills | Status: DC

## 2020-07-15 NOTE — Progress Notes (Addendum)
Virtual Visit via Telephone Note  I connected with Maria Franklin on 07/15/20 at  2:40 PM EDT by telephone and verified that I am speaking with the correct person using two identifiers.  Location: Patient: home Provider: home office   I discussed the limitations, risks, security and privacy concerns of performing an evaluation and management service by telephone and the availability of in person appointments. I also discussed with the patient that there may be a patient responsible charge related to this service. The patient expressed understanding and agreed to proceed.   History of Present Illness: Patient is evaluated by phone session.  She is taking Haldol 2.5 mg at bedtime.  She has not able to start therapy so far.  She feels Haldol helps the paranoia and she denies recent agitation, anger, hallucination.  Her husband is very supportive.  Her son lives in [place].  She is scheduled to have blood work.  She does not want to change the medication.    Psychiatric History:Reviewed. H/Oparanoia and delusions.H/O overdose and inpatient[facility].H/O overdose and inpatientat [facility].Readmitted in 4 months due tononcompliant with medication.Last admission at [facility] in Nov 2020.Abilifyworked butcould not afford. Noh/o abuse.   Psychiatric Specialty Exam: Physical Exam  Review of Systems  Weight 248 lb (112.5 kg), last menstrual period 02/02/2017.There is no height or weight on file to calculate BMI.  General Appearance: NA  Eye Contact:  NA  Speech:  Normal Rate  Volume:  Normal  Mood:  Euthymic  Affect:  NA  Thought Process:  Goal Directed  Orientation:  Full (Time, Place, and Person)  Thought Content:  Logical  Suicidal Thoughts:  No  Homicidal Thoughts:  No  Memory:  Immediate;   Good Recent;   Good Remote;   Good  Judgement:  Intact  Insight:  Present  Psychomotor Activity:  NA  Concentration:  Concentration: Fair and  Attention Span: Fair  Recall:  Good  Fund of Knowledge:  Good  Language:  Good  Akathisia:  No  Handed:  Right  AIMS (if indicated):     Assets:  Communication Skills Desire for Improvement Housing Resilience Social Support  ADL's:  Intact  Cognition:  WNL  Sleep:         Assessment and Plan: Schizophrenia chronic paranoid type.  Continue Haldol 2.5 mg bedtime.  Recommend therapy appointment to schedule.  1 more time recommended and referred names and current information for therapist.  Follow-up in 3 to 4 months.  Follow Up Instructions:    I discussed the assessment and treatment plan with the patient. The patient was provided an opportunity to ask questions and all were answered. The patient agreed with the plan and demonstrated an understanding of the instructions.   The patient was advised to call back or seek an in-person evaluation if the symptoms worsen or if the condition fails to improve as anticipated.  I provided 15 minutes of non-face-to-face time during this encounter.   Kathlee Nations, MD

## 2020-07-27 IMAGING — CT CT ABDOMEN WITHOUT AND WITH CONTRAST
2 of 4 series · 14 of 32 positions shown, 19 images · IV contrast (APPLIED)
Comparison: None

CLINICAL DATA: Increased all cerumen.  Adrenal evaluation

EXAM:
CT ABDOMEN WITHOUT AND WITH CONTRAST
TECHNIQUE: Multidetector CT imaging of the abdomen was performed following the
standard protocol before and following the bolus administration of
intravenous contrast.
CONTRAST:  125mL MMQYYC-HZZ IOPAMIDOL (MMQYYC-HZZ) INJECTION 61%
Creatinine was obtained on site at [HOSPITAL] at [HOSPITAL].
Results: Creatinine 1.0 mg/dL.

[Series 2: adrenal w/(date) · axial · 0.87mm/px · z∈[-231,-27]mm · 8 of 132 slices shown, 13 images]
[im 15/132  soft-tissue]
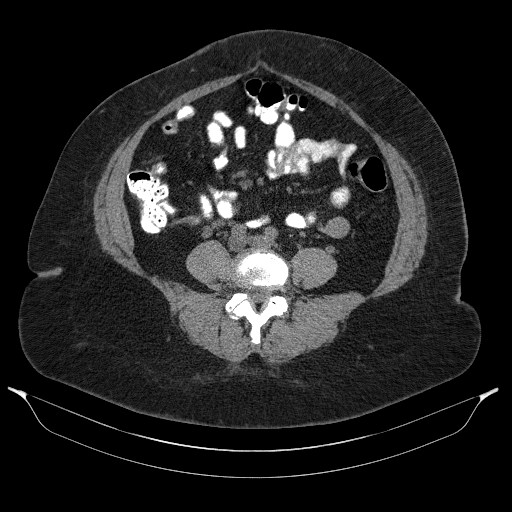
[im 15/132  bone]
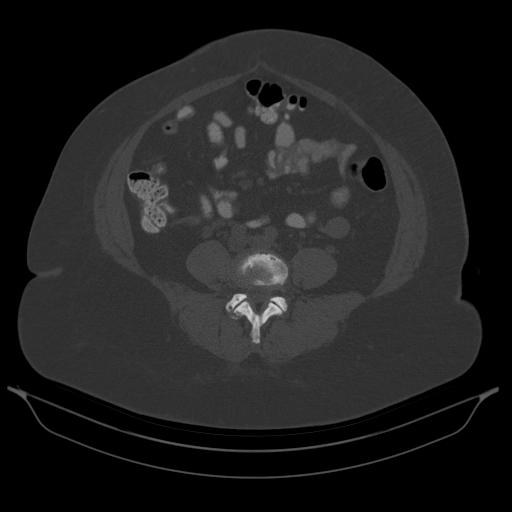
[im 30/132  soft-tissue]
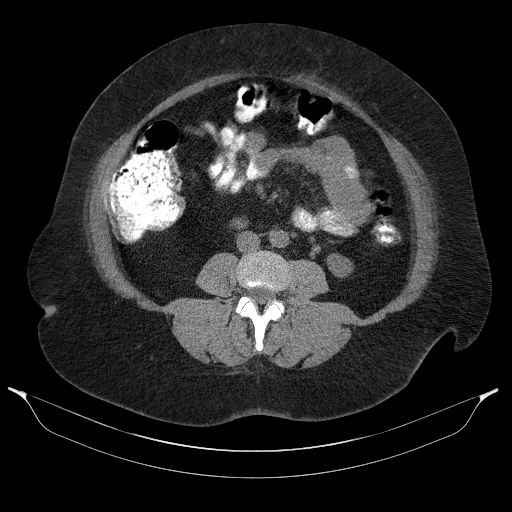
[im 44/132  soft-tissue]
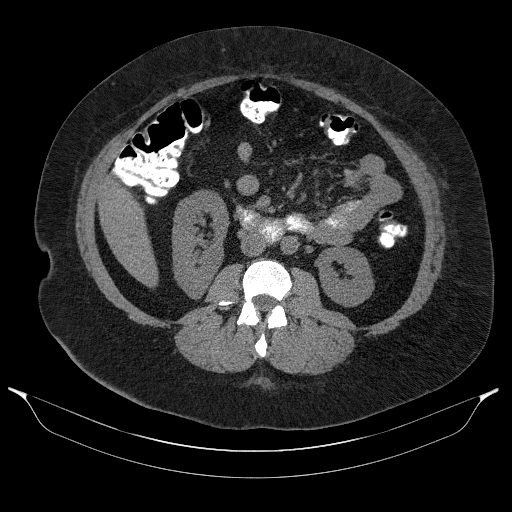
[im 59/132  soft-tissue]
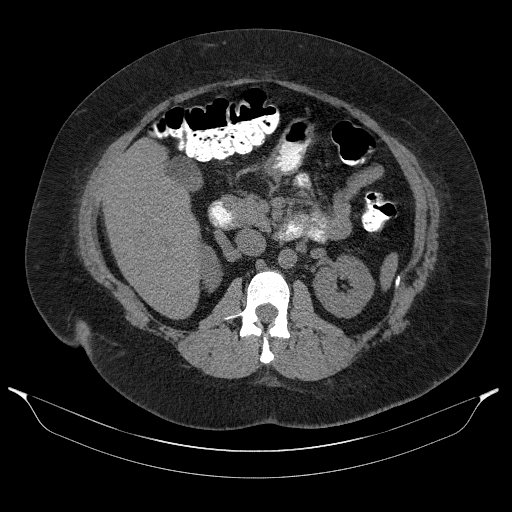
[im 73/132  soft-tissue]
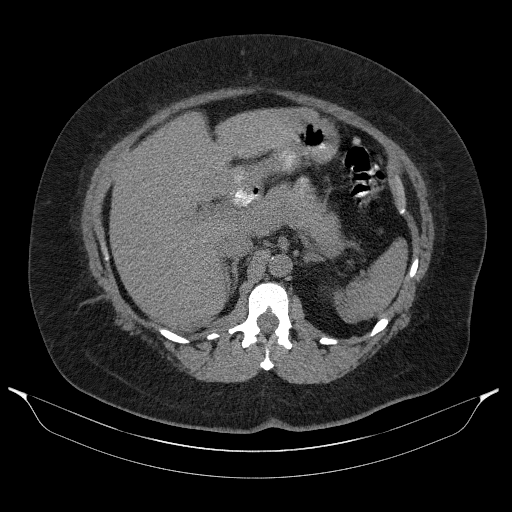
[im 73/132  lung]
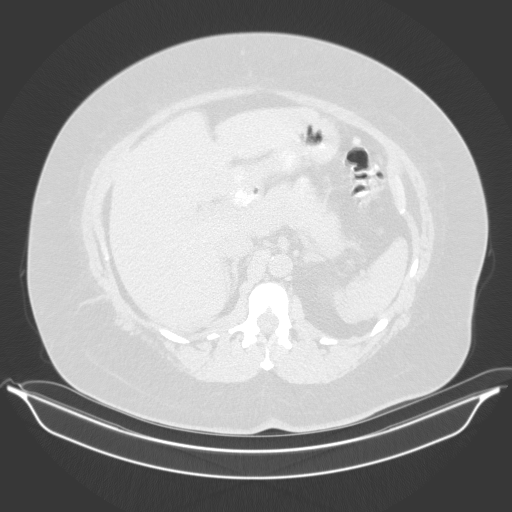
[im 88/132  soft-tissue]
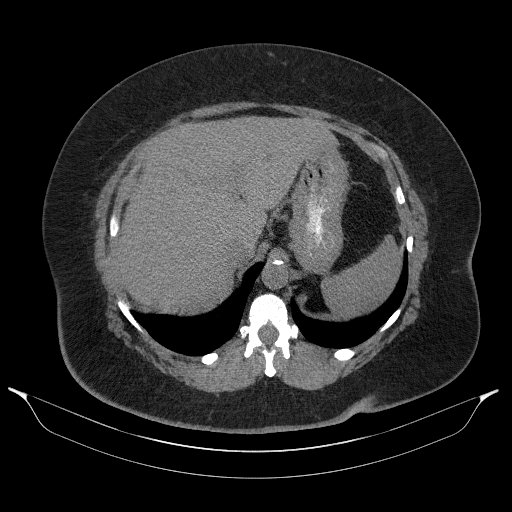
[im 88/132  lung]
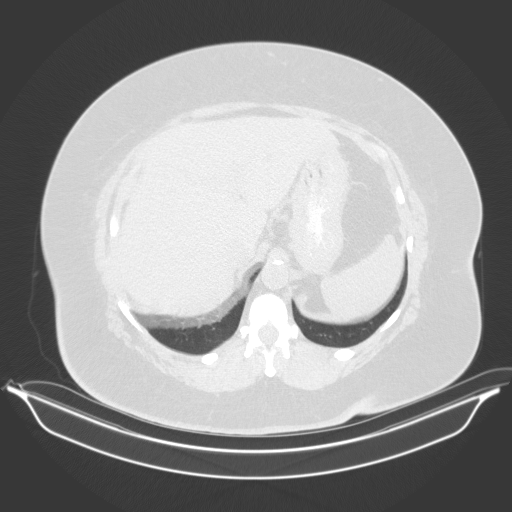
[im 102/132  soft-tissue]
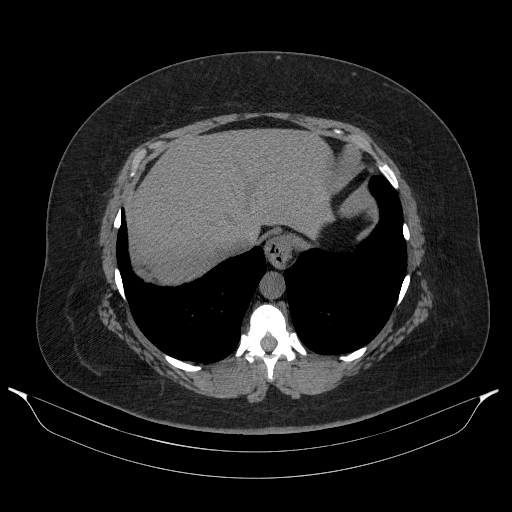
[im 102/132  lung]
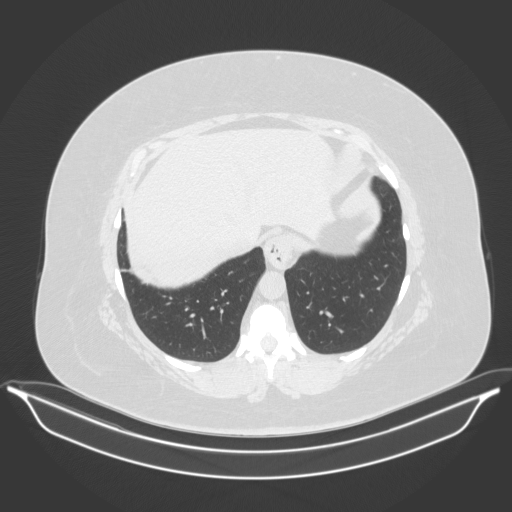
[im 117/132  soft-tissue]
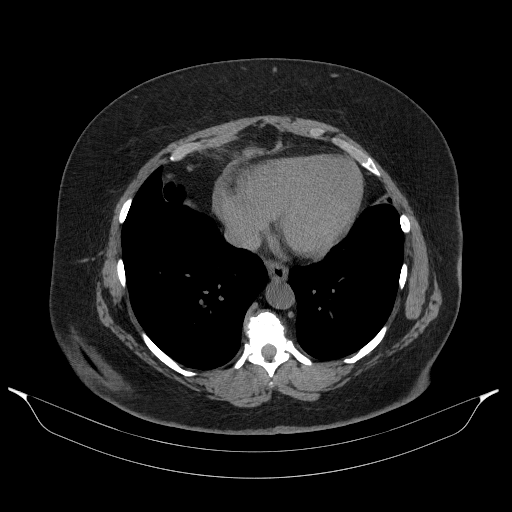
[im 117/132  lung]
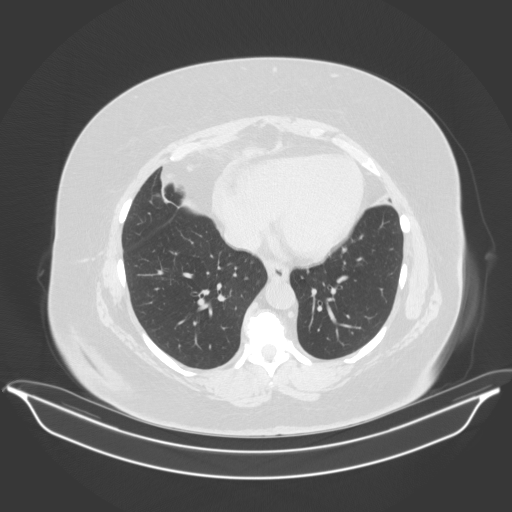

[Series 7: 10 min delay · axial · delayed · 0.87mm/px · z∈[-227,-63]mm · 6 of 132 slices shown]
[im 17/132  soft-tissue]
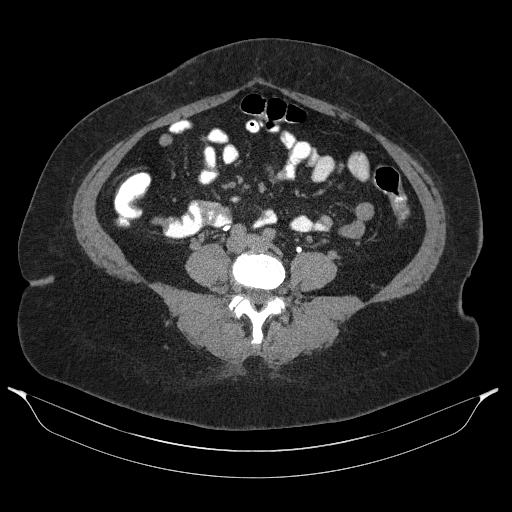
[im 33/132  soft-tissue]
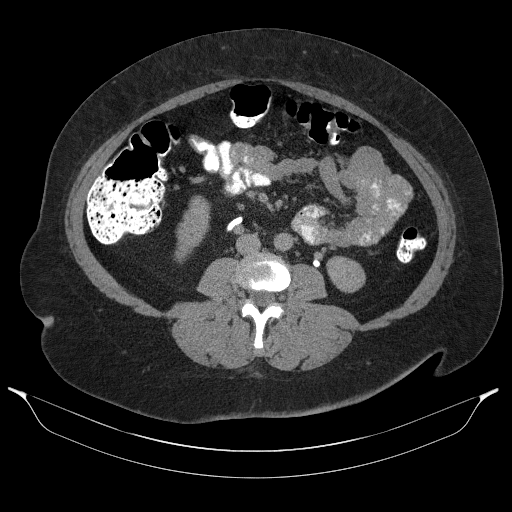
[im 50/132  soft-tissue]
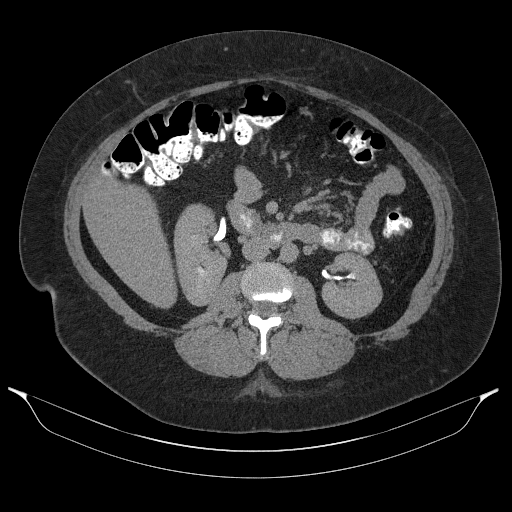
[im 66/132  soft-tissue]
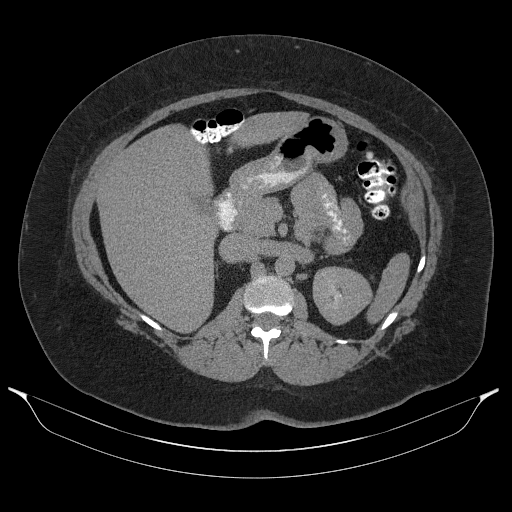
[im 82/132  soft-tissue]
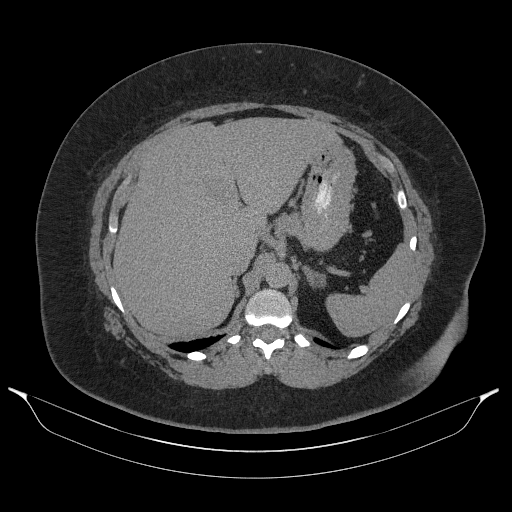
[im 99/132  soft-tissue]
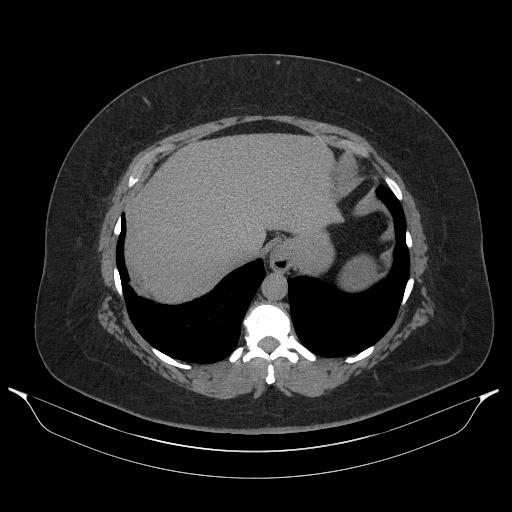

[14 of 32 positions shown; findings below may reference images not displayed]

FINDINGS: Lower chest: Lung bases are clear. Mild scarring in the inferior
RIGHT middle lobe

Hepatobiliary: No focal hepatic lesion. Normal gallbladder biliary
tree

Pancreas: Normal pancreatic parenchymal intensity. No ductal
dilatation or inflammation.

Spleen: Normal spleen.

Adrenals/urinary tract: A small nodule emanating from the lateral
limb of the LEFT adrenal gland measures 10 mm (image 47/9). The
nodule demonstrates post-contrast enhancement however lesion also
demonstrates significant post-contrast washout 65% which satisfies
the criteria for benign adrenal adenoma.

RIGHT adrenal glands normal.

The kidneys are normal.  Washout greater than 40%).

Stomach/Bowel: Stomach and limited of the small bowel is
unremarkable

Vascular/Lymphatic: Abdominal aortic normal caliber. No
retroperitoneal periportal lymphadenopathy.

Musculoskeletal: No aggressive osseous lesion
IMPRESSION: 1. Small nodule of the LEFT adrenal gland meets contrast washout
criteria for benign adenoma. However, given history of
hyperaldosteronism, further evaluation may be warranted (adrenal
vein sampling for example).

2.  Normal RIGHT adrenal gland.

3.  Normal kidneys.

## 2020-07-28 ENCOUNTER — Encounter: Payer: Self-pay | Admitting: Family Medicine

## 2020-07-28 ENCOUNTER — Ambulatory Visit (INDEPENDENT_AMBULATORY_CARE_PROVIDER_SITE_OTHER): Payer: Medicare HMO | Admitting: Family Medicine

## 2020-07-28 ENCOUNTER — Ambulatory Visit (INDEPENDENT_AMBULATORY_CARE_PROVIDER_SITE_OTHER): Payer: Medicare HMO

## 2020-07-28 ENCOUNTER — Other Ambulatory Visit: Payer: Self-pay

## 2020-07-28 VITALS — BP 110/78 | HR 86 | Temp 98.6°F | Ht 62.0 in | Wt 261.0 lb

## 2020-07-28 DIAGNOSIS — E1121 Type 2 diabetes mellitus with diabetic nephropathy: Secondary | ICD-10-CM

## 2020-07-28 DIAGNOSIS — E119 Type 2 diabetes mellitus without complications: Secondary | ICD-10-CM | POA: Diagnosis not present

## 2020-07-28 DIAGNOSIS — H04203 Unspecified epiphora, bilateral lacrimal glands: Secondary | ICD-10-CM | POA: Diagnosis not present

## 2020-07-28 DIAGNOSIS — N1831 Chronic kidney disease, stage 3a: Secondary | ICD-10-CM | POA: Diagnosis not present

## 2020-07-28 DIAGNOSIS — I10 Essential (primary) hypertension: Secondary | ICD-10-CM

## 2020-07-28 DIAGNOSIS — E782 Mixed hyperlipidemia: Secondary | ICD-10-CM | POA: Diagnosis not present

## 2020-07-28 DIAGNOSIS — Z0000 Encounter for general adult medical examination without abnormal findings: Secondary | ICD-10-CM | POA: Diagnosis not present

## 2020-07-28 DIAGNOSIS — K219 Gastro-esophageal reflux disease without esophagitis: Secondary | ICD-10-CM

## 2020-07-28 DIAGNOSIS — R635 Abnormal weight gain: Secondary | ICD-10-CM

## 2020-07-28 DIAGNOSIS — E1122 Type 2 diabetes mellitus with diabetic chronic kidney disease: Secondary | ICD-10-CM

## 2020-07-28 MED ORDER — ATORVASTATIN CALCIUM 40 MG PO TABS: 40.0000 mg | ORAL_TABLET | Freq: Every day | ORAL | 3 refills | Status: DC

## 2020-07-28 MED ORDER — CARVEDILOL 12.5 MG PO TABS: 12.5000 mg | ORAL_TABLET | Freq: Two times a day (BID) | ORAL | 3 refills | Status: DC

## 2020-07-28 MED ORDER — OMEPRAZOLE 40 MG PO CPDR: 40.0000 mg | DELAYED_RELEASE_CAPSULE | Freq: Every day | ORAL | 3 refills | Status: DC

## 2020-07-28 MED ORDER — HYDRALAZINE HCL 25 MG PO TABS: 12.5000 mg | ORAL_TABLET | Freq: Two times a day (BID) | ORAL | 3 refills | Status: DC

## 2020-07-28 NOTE — Progress Notes (Signed)
Subjective:   Maria Franklin is a 59 y.o. female who presents for Medicare Annual (Subsequent) preventive examination.     Review of Systems    N/A Cardiac Risk Factors include: diabetes mellitus;dyslipidemia;hypertension     Objective:    Today's Vitals   07/28/20 0947  BP: 110/78  Pulse: 86  Temp: 98.6 F (37 C)  TempSrc: Oral  SpO2: 97%  Weight: 261 lb (118.4 kg)  Height: 5\' 2"  (1.575 m)  PainSc: 6    Body mass index is 47.74 kg/m.  Advanced Directives 07/28/2020 08/30/2019 08/21/2019 03/29/2019 12/28/2018 12/04/2018 08/21/2017  Does Patient Have a Medical Advance Directive? No No No No No No No  Would patient like information on creating a medical advance directive? No - Patient declined No - Patient declined No - Patient declined No - Patient declined No - Patient declined No - Patient declined -  Pre-existing out of facility DNR order (yellow form or pink MOST form) - - - - - - -  Some encounter information is confidential and restricted. Go to Review Flowsheets activity to see all data.    Current Medications (verified) Outpatient Encounter Medications as of 07/28/2020  Medication Sig  . albuterol (VENTOLIN HFA) 108 (90 Base) MCG/ACT inhaler INHALE ONE TO TWO PUFFS INTO LUNGS EVERY 6 HOURS AS NEEDED FOR WHEEZING OR SHORTNESS OF BREATH  . amLODipine (NORVASC) 10 MG tablet Take 1 tablet (10 mg total) by mouth daily.  Marland Kitchen aspirin EC 81 MG EC tablet Take 1 tablet (81 mg total) by mouth daily.  Marland Kitchen atorvastatin (LIPITOR) 40 MG tablet Take 1 tablet (40 mg total) by mouth daily at 6 PM.  . budesonide-formoterol (SYMBICORT) 160-4.5 MCG/ACT inhaler Inhale 2 puffs into the lungs 2 (two) times daily.  . carvedilol (COREG) 12.5 MG tablet Take 1 tablet (12.5 mg total) by mouth 2 (two) times daily with a meal.  . Ferrous Sulfate (IRON) 325 (65 Fe) MG TABS Take 1 tablet by mouth every morning.  . fluticasone (FLONASE) 50 MCG/ACT nasal spray Place 2 sprays into both nostrils 2 (two)  times a day. (Patient taking differently: Place 2 sprays into both nostrils daily. )  . Fluticasone-Salmeterol (ADVAIR DISKUS) 250-50 MCG/DOSE AEPB Inhale 1 puff into the lungs 2 (two) times daily.  . haloperidol (HALDOL) 5 MG tablet Take 0.5 tablets (2.5 mg total) by mouth at bedtime. Take 1/2 tab at bed time  . hydrALAZINE (APRESOLINE) 25 MG tablet Take 0.5 tablets (12.5 mg total) by mouth in the morning and at bedtime.  Marland Kitchen levothyroxine (SYNTHROID, LEVOTHROID) 50 MCG tablet Take 1 tablet (50 mcg total) by mouth daily before breakfast. MUST SCHEDULE ANNUAL PHYSICAL  . lisinopril (ZESTRIL) 40 MG tablet Take 1 tablet (40 mg total) by mouth daily.  Marland Kitchen omeprazole (PRILOSEC) 40 MG capsule Take 1 capsule (40 mg total) by mouth daily.  . [DISCONTINUED] carvedilol (COREG) 25 MG tablet Take 0.5 tablets (12.5 mg total) by mouth 2 (two) times daily with a meal.   No facility-administered encounter medications on file as of 07/28/2020.    Allergies (verified) Bee pollen, Pollen extract, and Latex   History: Past Medical History:  Diagnosis Date  . Allergy   . Anemia   . Angio-edema   . Asthma    ALL THE TIME   LAST FLARE UP 12/2018  . Chest pain 08/2019  . Chicken pox   . Diabetes mellitus without complication ([place])   . GERD (gastroesophageal reflux disease)   . Hyperlipidemia   .  Hypertension   . Hyperthyroidism   . Recurrent upper respiratory infection (URI)   . Schizophrenia ([place])   . Sleep apnea    DX 2-3 YRS AGO.  Marland Kitchen Thyroid disease   . Urticaria    Past Surgical History:  Procedure Laterality Date  . left adrenal gland removal  07/2019  . SINOSCOPY    . SINUS ENDO W/FUSION Bilateral 01/05/2019   Procedure: ENDOSCOPIC SINUS SURGERY WITH NAVIGATION;  Surgeon: Jerrell Belfast, MD;  Location: [facility];  Service: ENT;  Laterality: Bilateral;  . SINUS IRRIGATION    . TONSILLECTOMY    . TURBINATE REDUCTION N/A 01/05/2019   Procedure: Turbinate Reduction;  Surgeon: Jerrell Belfast, MD;   Location: [facility] OR;  Service: ENT;  Laterality: N/A;   Family History  Problem Relation Age of Onset  . Mental illness Mother   . Hypertension Mother   . Schizophrenia Mother   . Depression Mother   . Cancer Father        LUNG  . Cancer Sister        BREAST  . Breast cancer Sister   . Mental illness Brother   . Hypertension Brother   . Schizophrenia Brother   . Depression Brother   . Heart disease Maternal Grandmother   . Depression Maternal Grandmother   . Schizophrenia Maternal Grandmother   . Schizophrenia Brother   . Schizophrenia Maternal Uncle   . Breast cancer Maternal Aunt   . Heart attack Sister   . Heart attack Sister   . Heart attack Sister   . Allergic rhinitis Neg Hx   . Angioedema Neg Hx   . Asthma Neg Hx   . Atopy Neg Hx   . Eczema Neg Hx   . Immunodeficiency Neg Hx   . Urticaria Neg Hx    Social History   Socioeconomic History  . Marital status: Married    Spouse name: Not on file  . Number of children: Not on file  . Years of education: Not on file  . Highest education level: Not on file  Occupational History  . Not on file  Tobacco Use  . Smoking status: Former Smoker    Packs/day: 0.25    Years: 6.00    Pack years: 1.50  . Smokeless tobacco: Never Used  . Tobacco comment: QUIT IN HER EARLY 20'S  Vaping Use  . Vaping Use: Never used  Substance and Sexual Activity  . Alcohol use: No    Alcohol/week: 0.0 standard drinks  . Drug use: No  . Sexual activity: Not Currently  Other Topics Concern  . 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 Difficulty of Paying Living Expenses: Not hard at all  Food Insecurity: No Food Insecurity  . Worried About Sales executive in the Last Year: Never true  . Ran Out of Food in the Last Year: Never true  Transportation Needs: No Transportation Needs  . Lack of Transportation (Medical): No  . Lack of Transportation (Non-Medical): No  Physical Activity: Inactive  . Days of Exercise per Week: 0 days  . Minutes of Exercise per Session: 0 min  Stress: No Stress  Concern Present  . Feeling of Stress : Not at all  Social Connections: Socially Integrated  . Frequency of Communication with Friends and Family: More than three times a week  . Frequency of Social Gatherings with Friends and Family: Once a week  . Attends Religious Services: More than 4 times per year  . Active Member of Clubs or Organizations: Yes  . Attends Archivist Meetings: More than 4 times per year  . Marital Status: Married    Tobacco Counseling Counseling given: Not Answered Comment: QUIT IN HER EARLY 20'S   Clinical Intake:  Pre-visit preparation completed: Yes  Pain : 0-10 Pain Score: 6  Pain Type: Acute pain, Chronic pain Pain Location: Foot (Back) Pain Descriptors / Indicators: Other (Comment) (Pulling) Pain Onset: 1 to 4 weeks ago Pain Frequency: Constant Pain Relieving Factors: Heating pad  Pain Relieving Factors: Heating pad  Nutritional Risks: None Diabetes: Yes (Patient states does not check glucose) CBG done?: No Did pt. bring in CBG monitor from home?: No  How often do you need to have someone help you when you read instructions, pamphlets, or other written materials from your doctor or pharmacy?: 1 - Never What is the last grade level you completed in school?: Associates Degree  Diabetic?Yes  Interpreter Needed?: No  Information entered by :: [place] of Daily Living In your present state of health, do you have any difficulty performing the following activities: 07/28/2020 08/30/2019  Hearing? N N  Vision? N N  Difficulty concentrating or making decisions? N N  Walking or  climbing stairs? N Y  Dressing or bathing? N N  Doing errands, shopping? N N  Preparing Food and eating ? N -  Using the Toilet? N -  In the past six months, have you accidently leaked urine? N -  Do you have problems with loss of bowel control? N -  Managing your Medications? N -  Managing your Finances? N -  Housekeeping or managing your Housekeeping? N -  Some encounter information is confidential and restricted. Go to Review Flowsheets activity to see all data.  Some recent data might be hidden    Patient Care Team: Billie Ruddy, MD as PCP - General (Family Medicine) Jettie Booze, MD as PCP - Cardiology (Cardiology)  Indicate any recent Medical Services you may have received from other than [facility] providers in the past year (date may be approximate).     Assessment:   This is a routine wellness examination for Almee.  Hearing/Vision screen  Hearing Screening   125Hz  250Hz  500Hz  1000Hz  2000Hz  3000Hz  4000Hz  6000Hz  8000Hz   Right ear:           Left ear:           Vision Screening Comments: Patient states eyes checked annually   Dietary issues and exercise activities discussed: Current Exercise Habits: The patient does not participate in regular exercise at present  Goals    . Patient Stated     I would like to be more active    . Weight (lb) < 160 lb (72.6 kg)     Patient would like to lose 100 lbs       Depression Screen PHQ 2/9 Scores 07/28/2020 07/25/2019 03/15/2019 02/14/2018 05/19/2017 11/04/2016 04/29/2016  PHQ - 2 Score 0 0 0 0 1 0 0  PHQ- 9 Score 0 0 - 0 - - -    Fall Risk Fall Risk  07/28/2020 07/25/2019 02/07/2019 02/14/2018 05/19/2017  Falls in the past year? - 0 1 No No  Comment - - pt had 1 fall resulting in sprained ankle - -  Number falls in past yr: 0 - 0 - -  Injury with Fall? 0 - 1 - -    Any stairs in or around the home? Yes  If so, are there any without handrails? No  Home free of loose throw rugs in walkways, pet beds, electrical cords,  etc? Yes  Adequate lighting in your home to reduce risk of falls? Yes   ASSISTIVE DEVICES UTILIZED TO PREVENT FALLS:  Life alert? No  Use of a cane, walker or w/c? No  Grab bars in the bathroom? No  Shower chair or bench in shower? Yes  Elevated toilet seat or a handicapped toilet? Yes   TIMED UP AND GO:  Was the test performed? Yes .  Length of time to ambulate 10 feet: 5  sec.   Gait steady and fast without use of assistive device  Cognitive Function: Cognitive screening not indicated based on direct observation   Montreal Cognitive Assessment  02/07/2019  Visuospatial/ Executive (0/5) 4  Naming (0/3) 3  Attention: Read list of digits (0/2) 2  Attention: Read list of letters (0/1) 1  Attention: Serial 7 subtraction starting at 100 (0/3) 2  Language: Repeat phrase (0/2) 2  Language : Fluency (0/1) 1  Abstraction (0/2) 2  Delayed Recall (0/5) 3  Orientation (0/6) 6  Total 26      Immunizations Immunization History  Administered Date(s) Administered  . Influenza,inj,Quad PF,6+ Mos 10/21/2014, 10/28/2015, 11/04/2016, 02/14/2018, 09/28/2018  . PFIZER SARS-COV-2 Vaccination 02/29/2020, 03/22/2020  . Pneumococcal Polysaccharide-23 10/21/2014, 03/21/2019  . Td 04/18/2016    TDAP status: Up to date Flu Vaccine status: Up to date Pneumococcal vaccine status: Completed during today's visit. Covid-19 vaccine status: Completed vaccines  Qualifies for Shingles Vaccine? Yes   Zostavax completed No   Shingrix Completed?: No.    Education has been provided regarding the importance of this vaccine. Patient has been advised to call insurance company to determine out of pocket expense if they have not yet received this vaccine. Advised may also receive vaccine at local pharmacy or Health Dept. Verbalized acceptance and understanding.  Screening Tests Health Maintenance  Topic Date Due  . OPHTHALMOLOGY EXAM  08/04/2019  . HEMOGLOBIN A1C  04/15/2020  . INFLUENZA VACCINE   07/06/2020  . FOOT EXAM  07/24/2020  . MAMMOGRAM  11/27/2021  . COLONOSCOPY  12/06/2021  . PAP SMEAR-Modifier  07/25/2024  . TETANUS/TDAP  04/18/2026  . PNEUMOCOCCAL POLYSACCHARIDE VACCINE AGE 31-64 HIGH RISK  Completed  . COVID-19 Vaccine  Completed  . Hepatitis C Screening  Completed  . HIV Screening  Completed    Health Maintenance  Health Maintenance Due  Topic Date Due  . OPHTHALMOLOGY EXAM  08/04/2019  . HEMOGLOBIN A1C  04/15/2020  . INFLUENZA VACCINE  07/06/2020  . FOOT EXAM  07/24/2020    Colorectal cancer screening: Completed 12/07/2011. Repeat every 10 years Mammogram status: Completed 11/28/2019. Repeat every year Bone Density Status: Not required at this age  Lung Cancer Screening: (Low Dose CT Chest recommended if Age 64-80 years, 30 pack-year currently smoking OR have quit w/in 15years.) does not qualify.   Lung Cancer Screening Referral: N/A  Additional Screening:  Hepatitis C Screening: does qualify; Completed 04/29/2016  Vision Screening: Recommended annual ophthalmology exams for early detection of glaucoma and other disorders of the eye. Is the patient up to date  with their annual eye exam?  Yes  Who is the provider or what is the name of the office in which the patient attends annual eye exams? Dr. [place] If pt is not established with a provider, would they like to be referred to a provider to establish care? No .   Dental Screening: Recommended annual dental exams for proper oral hygiene  Community Resource Referral / Chronic Care Management: CRR required this visit?  No   CCM required this visit?  No      Plan:     I have personally reviewed and noted the following in the patient's chart:   . Medical and social history . Use of alcohol, tobacco or illicit drugs  . Current medications and supplements . Functional ability and status . Nutritional status . Physical activity . Advanced directives . List of other  physicians . Hospitalizations, surgeries, and ER visits in previous 12 months . Vitals . Screenings to include cognitive, depression, and falls . Referrals and appointments  In addition, I have reviewed and discussed with patient certain preventive protocols, quality metrics, and best practice recommendations. A written personalized care plan for preventive services as well as general preventive health recommendations were provided to patient.     Ofilia Neas, LPN   [id number]   Nurse Notes: None

## 2020-07-28 NOTE — Patient Instructions (Signed)
Preventive Care 40-59 Years Old, Female Preventive care refers to visits with your health care provider and lifestyle choices that can promote health and wellness. This includes:  A yearly physical exam. This may also be called an annual well check.  Regular dental visits and eye exams.  Immunizations.  Screening for certain conditions.  Healthy lifestyle choices, such as eating a healthy diet, getting regular exercise, not using drugs or products that contain nicotine and tobacco, and limiting alcohol use. What can I expect for my preventive care visit? Physical exam Your health care provider will check your:  Height and weight. This may be used to calculate body mass index (BMI), which tells if you are at a healthy weight.  Heart rate and blood pressure.  Skin for abnormal spots. Counseling Your health care provider may ask you questions about your:  Alcohol, tobacco, and drug use.  Emotional well-being.  Home and relationship well-being.  Sexual activity.  Eating habits.  Work and work environment.  Method of birth control.  Menstrual cycle.  Pregnancy history. What immunizations do I need?  Influenza (flu) vaccine  This is recommended every year. Tetanus, diphtheria, and pertussis (Tdap) vaccine  You may need a Td booster every 10 years. Varicella (chickenpox) vaccine  You may need this if you have not been vaccinated. Zoster (shingles) vaccine  You may need this after age 60. Measles, mumps, and rubella (MMR) vaccine  You may need at least one dose of MMR if you were born in 1957 or later. You may also need a second dose. Pneumococcal conjugate (PCV13) vaccine  You may need this if you have certain conditions and were not previously vaccinated. Pneumococcal polysaccharide (PPSV23) vaccine  You may need one or two doses if you smoke cigarettes or if you have certain conditions. Meningococcal conjugate (MenACWY) vaccine  You may need this if you  have certain conditions. Hepatitis A vaccine  You may need this if you have certain conditions or if you travel or work in places where you may be exposed to hepatitis A. Hepatitis B vaccine  You may need this if you have certain conditions or if you travel or work in places where you may be exposed to hepatitis B. Haemophilus influenzae type b (Hib) vaccine  You may need this if you have certain conditions. Human papillomavirus (HPV) vaccine  If recommended by your health care provider, you may need three doses over 6 months. You may receive vaccines as individual doses or as more than one vaccine together in one shot (combination vaccines). Talk with your health care provider about the risks and benefits of combination vaccines. What tests do I need? Blood tests  Lipid and cholesterol levels. These may be checked every 5 years, or more frequently if you are over 50 years old.  Hepatitis C test.  Hepatitis B test. Screening  Lung cancer screening. You may have this screening every year starting at age 55 if you have a 30-pack-year history of smoking and currently smoke or have quit within the past 15 years.  Colorectal cancer screening. All adults should have this screening starting at age 50 and continuing until age 75. Your health care provider may recommend screening at age 45 if you are at increased risk. You will have tests every 1-10 years, depending on your results and the type of screening test.  Diabetes screening. This is done by checking your blood sugar (glucose) after you have not eaten for a while (fasting). You may have this   done every 1-3 years.  Mammogram. This may be done every 1-2 years. Talk with your health care provider about when you should start having regular mammograms. This may depend on whether you have a family history of breast cancer.  BRCA-related cancer screening. This may be done if you have a family history of breast, ovarian, tubal, or peritoneal  cancers.  Pelvic exam and Pap test. This may be done every 3 years starting at age 98. Starting at age 45, this may be done every 5 years if you have a Pap test in combination with an HPV test. Other tests  Sexually transmitted disease (STD) testing.  Bone density scan. This is done to screen for osteoporosis. You may have this scan if you are at high risk for osteoporosis. Follow these instructions at home: Eating and drinking  Eat a diet that includes fresh fruits and vegetables, whole grains, lean protein, and low-fat dairy.  Take vitamin and mineral supplements as recommended by your health care provider.  Do not drink alcohol if: ? Your health care provider tells you not to drink. ? You are pregnant, may be pregnant, or are planning to become pregnant.  If you drink alcohol: ? Limit how much you have to 0-1 drink a day. ? Be aware of how much alcohol is in your drink. In the [place] one drink equals one 12 oz bottle of beer (355 mL), one 5 oz glass of wine (148 mL), or one 1 oz glass of hard liquor (44 mL). Lifestyle  Take daily care of your teeth and gums.  Stay active. Exercise for at least 30 minutes on 5 or more days each week.  Do not use any products that contain nicotine or tobacco, such as cigarettes, e-cigarettes, and chewing tobacco. If you need help quitting, ask your health care provider.  If you are sexually active, practice safe sex. Use a condom or other form of birth control (contraception) in order to prevent pregnancy and STIs (sexually transmitted infections).  If told by your health care provider, take low-dose aspirin daily starting at age 34. What's next?  Visit your health care provider once a year for a well check visit.  Ask your health care provider how often you should have your eyes and teeth checked.  Stay up to date on all vaccines. This information is not intended to replace advice given to you by your health care provider. Make sure you  discuss any questions you have with your health care provider. Document Revised: 08/03/2018 Document Reviewed: 08/03/2018 Elsevier Patient Education  2020 [place] for Gastroesophageal Reflux Disease, Adult When you have gastroesophageal reflux disease (GERD), the foods you eat and your eating habits are very important. Choosing the right foods can help ease your discomfort. Think about working with a nutrition specialist (dietitian) to help you make good choices. What are tips for following this plan?  Meals  Choose healthy foods that are low in fat, such as fruits, vegetables, whole grains, low-fat dairy products, and lean meat, fish, and poultry.  Eat small meals often instead of 3 large meals a day. Eat your meals slowly, and in a place where you are relaxed. Avoid bending over or lying down until 2-3 hours after eating.  Avoid eating meals 2-3 hours before bed.  Avoid drinking a lot of liquid with meals.  Cook foods using methods other than frying. Bake, grill, or broil food instead.  Avoid or limit: ? Chocolate. ? Peppermint or spearmint. ?  Alcohol. ? Pepper. ? Black and decaffeinated coffee. ? Black and decaffeinated tea. ? Bubbly (carbonated) soft drinks. ? Caffeinated energy drinks and soft drinks.  Limit high-fat foods such as: ? Fatty meat or fried foods. ? Whole milk, cream, butter, or ice cream. ? Nuts and nut butters. ? Pastries, donuts, and sweets made with butter or shortening.  Avoid foods that cause symptoms. These foods may be different for everyone. Common foods that cause symptoms include: ? Tomatoes. ? Oranges, lemons, and limes. ? Peppers. ? Spicy food. ? Onions and garlic. ? Vinegar. Lifestyle  Maintain a healthy weight. Ask your doctor what weight is healthy for you. If you need to lose weight, work with your doctor to do so safely.  Exercise for at least 30 minutes for 5 or more days each week, or as told by your  doctor.  Wear loose-fitting clothes.  Do not smoke. If you need help quitting, ask your doctor.  Sleep with the head of your bed higher than your feet. Use a wedge under the mattress or blocks under the bed frame to raise the head of the bed. Summary  When you have gastroesophageal reflux disease (GERD), food and lifestyle choices are very important in easing your symptoms.  Eat small meals often instead of 3 large meals a day. Eat your meals slowly, and in a place where you are relaxed.  Limit high-fat foods such as fatty meat or fried foods.  Avoid bending over or lying down until 2-3 hours after eating.  Avoid peppermint and spearmint, caffeine, alcohol, and chocolate. This information is not intended to replace advice given to you by your health care provider. Make sure you discuss any questions you have with your health care provider. Document Revised: 03/15/2019 Document Reviewed: 12/28/2016 Elsevier Patient Education  2020 [employer]  Gastroesophageal Reflux Disease, Adult Gastroesophageal reflux (GER) happens when acid from the stomach flows up into the tube that connects the mouth and the stomach (esophagus). Normally, food travels down the esophagus and stays in the stomach to be digested. With GER, food and stomach acid sometimes move back up into the esophagus. You may have a disease called gastroesophageal reflux disease (GERD) if the reflux:  Happens often.  Causes frequent or very bad symptoms.  Causes problems such as damage to the esophagus. When this happens, the esophagus becomes sore and swollen (inflamed). Over time, GERD can make small holes (ulcers) in the lining of the esophagus. What are the causes? This condition is caused by a problem with the muscle between the esophagus and the stomach. When this muscle is weak or not normal, it does not close properly to keep food and acid from coming back up from the stomach. The muscle can be weak because  of:  Tobacco use.  Pregnancy.  Having a certain type of hernia (hiatal hernia).  Alcohol use.  Certain foods and drinks, such as coffee, chocolate, onions, and peppermint. What increases the risk? You are more likely to develop this condition if you:  Are overweight.  Have a disease that affects your connective tissue.  Use NSAID medicines. What are the signs or symptoms? Symptoms of this condition include:  Heartburn.  Difficult or painful swallowing.  The feeling of having a lump in the throat.  A bitter taste in the mouth.  Bad breath.  Having a lot of saliva.  Having an upset or bloated stomach.  Belching.  Chest pain. Different conditions can cause chest pain. Make sure you see  your doctor if you have chest pain.  Shortness of breath or noisy breathing (wheezing).  Ongoing (chronic) cough or a cough at night.  Wearing away of the surface of teeth (tooth enamel).  Weight loss. How is this treated? Treatment will depend on how bad your symptoms are. Your doctor may suggest:  Changes to your diet.  Medicine.  Surgery. Follow these instructions at home: Eating and drinking   Follow a diet as told by your doctor. You may need to avoid foods and drinks such as: ? Coffee and tea (with or without caffeine). ? Drinks that contain alcohol. ? Energy drinks and sports drinks. ? Bubbly (carbonated) drinks or sodas. ? Chocolate and cocoa. ? Peppermint and mint flavorings. ? Garlic and onions. ? Horseradish. ? Spicy and acidic foods. These include peppers, chili powder, curry powder, vinegar, hot sauces, and BBQ sauce. ? Citrus fruit juices and citrus fruits, such as oranges, lemons, and limes. ? Tomato-based foods. These include red sauce, chili, salsa, and pizza with red sauce. ? Fried and fatty foods. These include donuts, french fries, potato chips, and high-fat dressings. ? High-fat meats. These include hot dogs, rib eye steak, sausage, ham, and  bacon. ? High-fat dairy items, such as whole milk, butter, and cream cheese.  Eat small meals often. Avoid eating large meals.  Avoid drinking large amounts of liquid with your meals.  Avoid eating meals during the 2-3 hours before bedtime.  Avoid lying down right after you eat.  Do not exercise right after you eat. Lifestyle   Do not use any products that contain nicotine or tobacco. These include cigarettes, e-cigarettes, and chewing tobacco. If you need help quitting, ask your doctor.  Try to lower your stress. If you need help doing this, ask your doctor.  If you are overweight, lose an amount of weight that is healthy for you. Ask your doctor about a safe weight loss goal. General instructions  Pay attention to any changes in your symptoms.  Take over-the-counter and prescription medicines only as told by your doctor. Do not take aspirin, ibuprofen, or other NSAIDs unless your doctor says it is okay.  Wear loose clothes. Do not wear anything tight around your waist.  Raise (elevate) the head of your bed about 6 inches (15 cm).  Avoid bending over if this makes your symptoms worse.  Keep all follow-up visits as told by your doctor. This is important. Contact a doctor if:  You have new symptoms.  You lose weight and you do not know why.  You have trouble swallowing or it hurts to swallow.  You have wheezing or a cough that keeps happening.  Your symptoms do not get better with treatment.  You have a hoarse voice. Get help right away if:  You have pain in your arms, neck, jaw, teeth, or back.  You feel sweaty, dizzy, or light-headed.  You have chest pain or shortness of breath.  You throw up (vomit) and your throw-up looks like blood or coffee grounds.  You pass out (faint).  Your poop (stool) is bloody or black.  You cannot swallow, drink, or eat. Summary  If a person has gastroesophageal reflux disease (GERD), food and stomach acid move back up into  the esophagus and cause symptoms or problems such as damage to the esophagus.  Treatment will depend on how bad your symptoms are.  Follow a diet as told by your doctor.  Take all medicines only as told by your doctor. This information  is not intended to replace advice given to you by your health care provider. Make sure you discuss any questions you have with your health care provider. Document Revised: 05/31/2018 Document Reviewed: 05/31/2018 Elsevier Patient Education  2020 [employer]  Dyslipidemia Dyslipidemia is an imbalance of waxy, fat-like substances (lipids) in the blood. The body needs lipids in small amounts. Dyslipidemia often involves a high level of cholesterol or triglycerides, which are types of lipids. Common forms of dyslipidemia include:  High levels of LDL cholesterol. LDL is the type of cholesterol that causes fatty deposits (plaques) to build up in the blood vessels that carry blood away from your heart (arteries).  Low levels of HDL cholesterol. HDL cholesterol is the type of cholesterol that protects against heart disease. High levels of HDL remove the LDL buildup from arteries.  High levels of triglycerides. Triglycerides are a fatty substance in the blood that is linked to a buildup of plaques in the arteries. What are the causes? Primary dyslipidemia is caused by changes (mutations) in genes that are passed down through families (inherited). These mutations cause several types of dyslipidemia. Secondary dyslipidemia is caused by lifestyle choices and diseases that lead to dyslipidemia, such as:  Eating a diet that is high in animal fat.  Not getting enough exercise.  Having diabetes, kidney disease, liver disease, or thyroid disease.  Drinking large amounts of alcohol.  Using certain medicines. What increases the risk? You are more likely to develop this condition if you are an older man or if you are a woman who has gone through menopause. Other risk  factors include:  Having a family history of dyslipidemia.  Taking certain medicines, including birth control pills, steroids, some diuretics, and beta-blockers.  Smoking cigarettes.  Eating a high-fat diet.  Having certain medical conditions such as diabetes, polycystic ovary syndrome (PCOS), kidney disease, liver disease, or hypothyroidism.  Not exercising regularly.  Being overweight or obese with too much belly fat. What are the signs or symptoms? In most cases, dyslipidemia does not usually cause any symptoms. In severe cases, very high lipid levels can cause:  Fatty bumps under the skin (xanthomas).  White or gray ring around the black center (pupil) of the eye. Very high triglyceride levels can cause inflammation of the pancreas (pancreatitis). How is this diagnosed? Your health care provider may diagnose dyslipidemia based on a routine blood test (fasting blood test). Because most people do not have symptoms of the condition, this blood testing (lipid profile) is done on adults age 76 and older and is repeated every 5 years. This test checks:  Total cholesterol. This measures the total amount of cholesterol in your blood, including LDL cholesterol, HDL cholesterol, and triglycerides. A healthy number is below 200.  LDL cholesterol. The target number for LDL cholesterol is different for each person, depending on individual risk factors. Ask your health care provider what your LDL cholesterol should be.  HDL cholesterol. An HDL level of 60 or higher is best because it helps to protect against heart disease. A number below 40 for men or below 50 for women increases the risk for heart disease.  Triglycerides. A healthy triglyceride number is below 150. If your lipid profile is abnormal, your health care provider may do other blood tests. How is this treated? Treatment depends on the type of dyslipidemia that you have and your other risk factors for heart disease and stroke.  Your health care provider will have a target range for your lipid levels based  on this information. For many people, this condition may be treated by lifestyle changes, such as diet and exercise. Your health care provider may recommend that you:  Get regular exercise.  Make changes to your diet.  Quit smoking if you smoke. If diet changes and exercise do not help you reach your goals, your health care provider may also prescribe medicine to lower lipids. The most commonly prescribed type of medicine lowers your LDL cholesterol (statin drug). If you have a high triglyceride level, your provider may prescribe another type of drug (fibrate) or an omega-3 fish oil supplement, or both. Follow these instructions at home:  Eating and drinking  Follow instructions from your health care provider or dietitian about eating or drinking restrictions.  Eat a healthy diet as told by your health care provider. This can help you reach and maintain a healthy weight, lower your LDL cholesterol, and raise your HDL cholesterol. This may include: ? Limiting your calories, if you are overweight. ? Eating more fruits, vegetables, whole grains, fish, and lean meats. ? Limiting saturated fat, trans fat, and cholesterol.  If you drink alcohol: ? Limit how much you use. ? Be aware of how much alcohol is in your drink. In the [place] one drink equals one 12 oz bottle of beer (355 mL), one 5 oz glass of wine (148 mL), or one 1 oz glass of hard liquor (44 mL).  Do not drink alcohol if: ? Your health care provider tells you not to drink. ? You are pregnant, may be pregnant, or are planning to become pregnant. Activity  Get regular exercise. Start an exercise and strength training program as told by your health care provider. Ask your health care provider what activities are safe for you. Your health care provider may recommend: ? 30 minutes of aerobic activity 4-6 days a week. Brisk walking is an example of aerobic  activity. ? Strength training 2 days a week. General instructions  Do not use any products that contain nicotine or tobacco, such as cigarettes, e-cigarettes, and chewing tobacco. If you need help quitting, ask your health care provider.  Take over-the-counter and prescription medicines only as told by your health care provider. This includes supplements.  Keep all follow-up visits as told by your health care provider. Contact a health care provider if:  You are: ? Having trouble sticking to your exercise or diet plan. ? Struggling to quit smoking or control your use of alcohol. Summary  Dyslipidemia often involves a high level of cholesterol or triglycerides, which are types of lipids.  Treatment depends on the type of dyslipidemia that you have and your other risk factors for heart disease and stroke.  For many people, treatment starts with lifestyle changes, such as diet and exercise.  Your health care provider may prescribe medicine to lower lipids. This information is not intended to replace advice given to you by your health care provider. Make sure you discuss any questions you have with your health care provider. Document Revised: 07/17/2018 Document Reviewed: 06/23/2018 Elsevier Patient Education  [place].

## 2020-07-28 NOTE — Patient Instructions (Signed)
[name] , Thank you for taking time to come for your Medicare Wellness Visit. I appreciate your ongoing commitment to your health goals. Please review the following plan we discussed and let me know if I can assist you in the future.   Screening recommendations/referrals: Colonoscopy: Up to date, next due 12/05/2021 Mammogram: Up to date next due 11/27/2020 Bone Density: Not due until age 59 Recommended yearly ophthalmology/optometry visit for glaucoma screening and checkup Recommended yearly dental visit for hygiene and checkup  Vaccinations: Influenza vaccine: Up to date, next due this fall 2021 Pneumococcal vaccine: Completed series Tdap vaccine: Up to date, next due 04/18/2026 Shingles vaccine: Currently due, may receive at your pharmacy     Advanced directives: Advance directive discussed with you today. Even though you declined this today please call our office should you change your mind and we can give you the proper paperwork for you to fill out.   Conditions/risks identified: None  Next appointment: None   Preventive Care 40-64 Years, Female Preventive care refers to lifestyle choices and visits with your health care provider that can promote health and wellness. What does preventive care include?  A yearly physical exam. This is also called an annual well check.  Dental exams once or twice a year.  Routine eye exams. Ask your health care provider how often you should have your eyes checked.  Personal lifestyle choices, including:  Daily care of your teeth and gums.  Regular physical activity.  Eating a healthy diet.  Avoiding tobacco and drug use.  Limiting alcohol use.  Practicing safe sex.  Taking low-dose aspirin daily starting at age 29.  Taking vitamin and mineral supplements as recommended by your health care provider. What happens during an annual well check? The services and screenings done by your health care provider during your annual well  check will depend on your age, overall health, lifestyle risk factors, and family history of disease. Counseling  Your health care provider may ask you questions about your:  Alcohol use.  Tobacco use.  Drug use.  Emotional well-being.  Home and relationship well-being.  Sexual activity.  Eating habits.  Work and work Statistician.  Method of birth control.  Menstrual cycle.  Pregnancy history. Screening  You may have the following tests or measurements:  Height, weight, and BMI.  Blood pressure.  Lipid and cholesterol levels. These may be checked every 5 years, or more frequently if you are over 13 years old.  Skin check.  Lung cancer screening. You may have this screening every year starting at age 54 if you have a 30-pack-year history of smoking and currently smoke or have quit within the past 15 years.  Fecal occult blood test (FOBT) of the stool. You may have this test every year starting at age 56.  Flexible sigmoidoscopy or colonoscopy. You may have a sigmoidoscopy every 5 years or a colonoscopy every 10 years starting at age 36.  Hepatitis C blood test.  Hepatitis B blood test.  Sexually transmitted disease (STD) testing.  Diabetes screening. This is done by checking your blood sugar (glucose) after you have not eaten for a while (fasting). You may have this done every 1-3 years.  Mammogram. This may be done every 1-2 years. Talk to your health care provider about when you should start having regular mammograms. This may depend on whether you have a family history of breast cancer.  BRCA-related cancer screening. This may be done if you have a family history of breast,  ovarian, tubal, or peritoneal cancers.  Pelvic exam and Pap test. This may be done every 3 years starting at age 66. Starting at age 27, this may be done every 5 years if you have a Pap test in combination with an HPV test.  Bone density scan. This is done to screen for osteoporosis. You  may have this scan if you are at high risk for osteoporosis. Discuss your test results, treatment options, and if necessary, the need for more tests with your health care provider. Vaccines  Your health care provider may recommend certain vaccines, such as:  Influenza vaccine. This is recommended every year.  Tetanus, diphtheria, and acellular pertussis (Tdap, Td) vaccine. You may need a Td booster every 10 years.  Zoster vaccine. You may need this after age 60.  Pneumococcal 13-valent conjugate (PCV13) vaccine. You may need this if you have certain conditions and were not previously vaccinated.  Pneumococcal polysaccharide (PPSV23) vaccine. You may need one or two doses if you smoke cigarettes or if you have certain conditions. Talk to your health care provider about which screenings and vaccines you need and how often you need them. This information is not intended to replace advice given to you by your health care provider. Make sure you discuss any questions you have with your health care provider. Document Released: 12/19/2015 Document Revised: 08/11/2016 Document Reviewed: 09/23/2015 Elsevier Interactive Patient Education  2017 [employer] Prevention in the Home Falls can cause injuries. They can happen to people of all ages. There are many things you can do to make your home safe and to help prevent falls. What can I do on the outside of my home?  Regularly fix the edges of walkways and driveways and fix any cracks.  Remove anything that might make you trip as you walk through a door, such as a raised step or threshold.  Trim any bushes or trees on the path to your home.  Use bright outdoor lighting.  Clear any walking paths of anything that might make someone trip, such as rocks or tools.  Regularly check to see if handrails are loose or broken. Make sure that both sides of any steps have handrails.  Any raised decks and porches should have guardrails on the  edges.  Have any leaves, snow, or ice cleared regularly.  Use sand or salt on walking paths during winter.  Clean up any spills in your garage right away. This includes oil or grease spills. What can I do in the bathroom?  Use night lights.  Install grab bars by the toilet and in the tub and shower. Do not use towel bars as grab bars.  Use non-skid mats or decals in the tub or shower.  If you need to sit down in the shower, use a plastic, non-slip stool.  Keep the floor dry. Clean up any water that spills on the floor as soon as it happens.  Remove soap buildup in the tub or shower regularly.  Attach bath mats securely with double-sided non-slip rug tape.  Do not have throw rugs and other things on the floor that can make you trip. What can I do in the bedroom?  Use night lights.  Make sure that you have a light by your bed that is easy to reach.  Do not use any sheets or blankets that are too big for your bed. They should not hang down onto the floor.  Have a firm chair that has side  arms. You can use this for support while you get dressed.  Do not have throw rugs and other things on the floor that can make you trip. What can I do in the kitchen?  Clean up any spills right away.  Avoid walking on wet floors.  Keep items that you use a lot in easy-to-reach places.  If you need to reach something above you, use a strong step stool that has a grab bar.  Keep electrical cords out of the way.  Do not use floor polish or wax that makes floors slippery. If you must use wax, use non-skid floor wax.  Do not have throw rugs and other things on the floor that can make you trip. What can I do with my stairs?  Do not leave any items on the stairs.  Make sure that there are handrails on both sides of the stairs and use them. Fix handrails that are broken or loose. Make sure that handrails are as long as the stairways.  Check any carpeting to make sure that it is firmly  attached to the stairs. Fix any carpet that is loose or worn.  Avoid having throw rugs at the top or bottom of the stairs. If you do have throw rugs, attach them to the floor with carpet tape.  Make sure that you have a light switch at the top of the stairs and the bottom of the stairs. If you do not have them, ask someone to add them for you. What else can I do to help prevent falls?  Wear shoes that:  Do not have high heels.  Have rubber bottoms.  Are comfortable and fit you well.  Are closed at the toe. Do not wear sandals.  If you use a stepladder:  Make sure that it is fully opened. Do not climb a closed stepladder.  Make sure that both sides of the stepladder are locked into place.  Ask someone to hold it for you, if possible.  Clearly mark and make sure that you can see:  Any grab bars or handrails.  First and last steps.  Where the edge of each step is.  Use tools that help you move around (mobility aids) if they are needed. These include:  Canes.  Walkers.  Scooters.  Crutches.  Turn on the lights when you go into a dark area. Replace any light bulbs as soon as they burn out.  Set up your furniture so you have a clear path. Avoid moving your furniture around.  If any of your floors are uneven, fix them.  If there are any pets around you, be aware of where they are.  Review your medicines with your doctor. Some medicines can make you feel dizzy. This can increase your chance of falling. Ask your doctor what other things that you can do to help prevent falls. This information is not intended to replace advice given to you by your health care provider. Make sure you discuss any questions you have with your health care provider. Document Released: 09/18/2009 Document Revised: 04/29/2016 Document Reviewed: 12/27/2014 Elsevier Interactive Patient Education  2017 [employer].

## 2020-07-28 NOTE — Progress Notes (Signed)
Subjective:     Maria Franklin is a 59 y.o. female and is here for a comprehensive physical exam. The patient reports problems - watery eyes, back and foot pain.  Pt notes continued watering of eyes.  States people think she is crying all the time.  Pt is not currently taking allergy meds.   Pt thinks some of her back and foot pain returning is 2/2 weight gain.  Pt states bp has been good.  Needs refill on Coreg.  Pt also needs refill on Omprazole.    Social History   Socioeconomic History  . Marital status: Married    Spouse name: Not on file  . Number of children: Not on file  . Years of education: Not on file  . Highest education level: Not on file  Occupational History  . Not on file  Tobacco Use  . Smoking status: Former Smoker    Packs/day: 0.25    Years: 6.00    Pack years: 1.50  . Smokeless tobacco: Never Used  . Tobacco comment: QUIT IN HER EARLY 20'S  Vaping Use  . Vaping Use: Never used  Substance and Sexual Activity  . Alcohol use: No    Alcohol/week: 0.0 standard drinks  . Drug use: No  . Sexual activity: Not Currently  Other Topics Concern  . 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 Difficulty of Paying Living Expenses: Not on file  Food Insecurity:   . Worried About Charity fundraiser in the Last Year: Not on file  . Ran Out of Food in the Last Year: Not on file  Transportation Needs:   . Lack of Transportation (Medical): Not on file  . Lack of Transportation (Non-Medical): Not on file  Physical Activity:   . Days of Exercise per Week: Not on file  . Minutes of Exercise per Session: Not on file  Stress:   . Feeling of Stress : Not on file  Social Connections:   . Frequency of  Communication with Friends and Family: Not on file  . Frequency of Social Gatherings with Friends and Family: Not on file  . Attends Religious Services: Not on file  . Active Member of Clubs or Organizations: Not on file  . Attends Archivist Meetings: Not on file  . Marital Status: Not on file  Intimate Partner Violence:   . Fear of Current or Ex-Partner: Not on file  . Emotionally Abused: Not on file  . Physically Abused: Not on file  . Sexually Abused: Not on file   Health Maintenance  Topic Date Due  . OPHTHALMOLOGY EXAM  08/04/2019  . HEMOGLOBIN A1C  04/15/2020  . INFLUENZA VACCINE  07/06/2020  . FOOT EXAM  07/24/2020  . MAMMOGRAM  11/27/2021  . COLONOSCOPY  12/06/2021  . PAP SMEAR-Modifier  07/25/2024  . TETANUS/TDAP  04/18/2026  . PNEUMOCOCCAL POLYSACCHARIDE VACCINE AGE 29-64 HIGH RISK  Completed  . COVID-19 Vaccine  Completed  . Hepatitis C Screening  Completed  . HIV Screening  Completed    The following portions of the patient's history were reviewed and updated as appropriate: allergies, current medications, past family history, past  medical history, past social history, past surgical history and problem list.  Review of Systems Pertinent items noted in HPI and remainder of comprehensive ROS otherwise negative.   Objective:    BP 110/78 (BP Location: Left Arm, Patient Position: Sitting, Cuff Size: Large)   Pulse 86   Temp 98.6 F (37 C) (Oral)   Ht $R'5\' 2"'BB$  (1.575 m)   Wt 261 lb (118.4 kg)   LMP 02/02/2017 (Approximate)   SpO2 97%   BMI 47.74 kg/m  General appearance: alert, cooperative and no distress Head: Normocephalic, without obvious abnormality, atraumatic Eyes: conjunctivae/corneas clear. PERRL, EOM's intact. Fundi benign. Ears: normal TM's and external ear canals both ears Nose: Nares normal. Septum midline. Mucosa normal. No drainage or sinus tenderness. Throat: lips, mucosa, and tongue normal; teeth and gums normal Neck: no adenopathy, no  carotid bruit, no JVD, supple, symmetrical, trachea midline and thyroid not enlarged, symmetric, no tenderness/mass/nodules Lungs: clear to auscultation bilaterally Heart: regular rate and rhythm, S1, S2 normal, no murmur, click, rub or gallop Abdomen: soft, non-tender; bowel sounds normal; no masses,  no organomegaly Extremities: extremities normal, atraumatic, no cyanosis or edema Pulses: 2+ and symmetric Skin: Skin color, texture, turgor normal. No rashes or lesions Lymph nodes: Cervical, supraclavicular, and axillary nodes normal. Neurologic: Alert and oriented X 3, normal strength and tone. Normal symmetric reflexes. Normal coordination and gait    Assessment:    Healthy female exam.     Plan:     Anticipatory guidance given including wearing seatbelts, smoke detectors in the home, increasing physical activity, increasing p.o. intake of water and vegetables. -will obtain labs -mammogram due 11/2020 -pap done 07/26/19 -given handout -next CPE in 1 yr See After Visit Summary for Counseling Recommendations    Essential hypertension  -controlled -continue lisinopril 40 mg, hydralazine 12.5 mg BID, Coreg 21.5 mg twice daily. - Plan: CMP with eGFR([facility]), hydrALAZINE (APRESOLINE) 25 MG tablet, carvedilol (COREG) 12.5 MG tablet, CMP with eGFR([facility])  Weight gain  -discussed lifestyle modifications -encouraged to increase physical activity - Plan: Hemoglobin A1c, TSH, T4, free  Mixed hyperlipidemia  -lifestyle modifications - Plan: Lipid panel, atorvastatin (LIPITOR) 40 MG tablet, Lipid panel  Type 2 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 disease, without long-term current use of insulin ([place]) -diet controlled, not currently on meds. -hgb A1C 6.2% on 10/17/19, as high as 6.9% in 2018 and 2015 per chart review. -continue lifestyle modifications -Plan: hgb A1C  Gastroesophageal reflux disease, unspecified whether esophagitis present  - Plan: omeprazole (PRILOSEC) 40 MG  capsule  Watery eyes -discussed OTC allergy meds -consider f/u with Ophthalmology for continued or worsened symptoms  F/u in the next few months, sooner if needed.  Grier Mitts, MD

## 2020-07-29 LAB — LIPID PANEL
Cholesterol: 156 mg/dL (ref <200)
HDL: 70 mg/dL (ref >=50)
LDL Cholesterol (Calc): 69 mg/dL (calc)
Non-HDL Cholesterol (Calc): 86 mg/dL (calc) (ref <130)
Total CHOL/HDL Ratio: 2.2 (calc) (ref <5.0)
Triglycerides: 83 mg/dL (ref <150)

## 2020-07-29 LAB — HEMOGLOBIN A1C
Hgb A1c MFr Bld: 6.3 % of total Hgb — ABNORMAL HIGH (ref <5.7)
Mean Plasma Glucose: 134 (calc)
eAG (mmol/L): 7.4 (calc)

## 2020-07-29 LAB — COMPLETE METABOLIC PANEL WITH GFR
AG Ratio: 1.4 (calc) (ref 1.0–2.5)
ALT: 19 U/L (ref 6–29)
AST: 20 U/L (ref 10–35)
Albumin: 4 g/dL (ref 3.6–5.1)
Alkaline phosphatase (APISO): 86 U/L (ref 37–153)
BUN/Creatinine Ratio: 21 (calc) (ref 6–22)
BUN: 24 mg/dL (ref 7–25)
CO2: 28 mmol/L (ref 20–32)
Calcium: 9.6 mg/dL (ref 8.6–10.4)
Chloride: 104 mmol/L (ref 98–110)
Creat: 1.14 mg/dL — ABNORMAL HIGH (ref 0.50–1.05)
GFR, Est African American: 61 mL/min/{1.73_m2} (ref >=60)
GFR, Est Non African American: 53 mL/min/{1.73_m2} — ABNORMAL LOW (ref >=60)
Globulin: 2.9 g/dL (calc) (ref 1.9–3.7)
Glucose, Bld: 104 mg/dL — ABNORMAL HIGH (ref 65–99)
Potassium: 4.5 mmol/L (ref 3.5–5.3)
Sodium: 140 mmol/L (ref 135–146)
Total Bilirubin: 0.3 mg/dL (ref 0.2–1.2)
Total Protein: 6.9 g/dL (ref 6.1–8.1)

## 2020-07-29 LAB — CBC
HCT: 33.7 % — ABNORMAL LOW (ref 35.0–45.0)
Hemoglobin: 10.7 g/dL — ABNORMAL LOW (ref 11.7–15.5)
MCH: 27.3 pg (ref 27.0–33.0)
MCHC: 31.8 g/dL — ABNORMAL LOW (ref 32.0–36.0)
MCV: 86 fL (ref 80.0–100.0)
MPV: 9.7 fL (ref 7.5–12.5)
Platelets: 260 10*3/uL (ref 140–400)
RBC: 3.92 10*6/uL (ref 3.80–5.10)
RDW: 13 % (ref 11.0–15.0)
WBC: 8.7 10*3/uL (ref 3.8–10.8)

## 2020-07-29 LAB — T4, FREE: Free T4: 1 ng/dL (ref 0.8–1.8)

## 2020-07-29 LAB — TSH: TSH: 3.27 mIU/L (ref 0.40–4.50)

## 2020-08-03 ENCOUNTER — Encounter: Payer: Self-pay | Admitting: Family Medicine

## 2020-08-04 ENCOUNTER — Encounter: Payer: Self-pay | Admitting: Family Medicine

## 2020-08-06 ENCOUNTER — Other Ambulatory Visit: Payer: Self-pay

## 2020-08-06 ENCOUNTER — Ambulatory Visit: Payer: Medicare HMO | Admitting: Family Medicine

## 2020-08-06 NOTE — Telephone Encounter (Signed)
Spoke with pt reviewed lab results verbalized understanding

## 2020-08-07 ENCOUNTER — Encounter: Payer: Self-pay | Admitting: Family Medicine

## 2020-08-07 ENCOUNTER — Other Ambulatory Visit: Payer: Medicare HMO

## 2020-08-07 ENCOUNTER — Ambulatory Visit (INDEPENDENT_AMBULATORY_CARE_PROVIDER_SITE_OTHER)
Admission: RE | Admit: 2020-08-07 | Discharge: 2020-08-07 | Disposition: A | Discharge status: Home / Self Care | Payer: Medicare HMO | Source: Ambulatory Visit | Attending: Family Medicine | Admitting: Family Medicine

## 2020-08-07 ENCOUNTER — Ambulatory Visit (INDEPENDENT_AMBULATORY_CARE_PROVIDER_SITE_OTHER): Payer: Medicare HMO | Admitting: Family Medicine

## 2020-08-07 VITALS — BP 118/78 | HR 90 | Temp 98.2°F | Wt 262.0 lb

## 2020-08-07 DIAGNOSIS — D649 Anemia, unspecified: Secondary | ICD-10-CM | POA: Diagnosis not present

## 2020-08-07 DIAGNOSIS — M50321 Other cervical disc degeneration at C4-C5 level: Secondary | ICD-10-CM | POA: Diagnosis not present

## 2020-08-07 DIAGNOSIS — D519 Vitamin B12 deficiency anemia, unspecified: Secondary | ICD-10-CM | POA: Diagnosis not present

## 2020-08-07 DIAGNOSIS — M4802 Spinal stenosis, cervical region: Secondary | ICD-10-CM | POA: Diagnosis not present

## 2020-08-07 DIAGNOSIS — M5412 Radiculopathy, cervical region: Secondary | ICD-10-CM | POA: Diagnosis not present

## 2020-08-07 DIAGNOSIS — M50322 Other cervical disc degeneration at C5-C6 level: Secondary | ICD-10-CM | POA: Diagnosis not present

## 2020-08-07 DIAGNOSIS — M50323 Other cervical disc degeneration at C6-C7 level: Secondary | ICD-10-CM | POA: Diagnosis not present

## 2020-08-07 NOTE — Patient Instructions (Signed)

## 2020-08-07 NOTE — Progress Notes (Signed)
Subjective:    Patient ID: Maria Franklin, female    DOB: 31-May-1961, 59 y.o.   MRN: [id number]  No chief complaint on file.   HPI Patient was seen today for ongoing concern.  Patient endorses upper back pain with numbness and tingling in to LUE.  Pt notes episodes of similar symptoms over the last year.  "Pins-and-needles" sensation in LUE that comes and goes.  Sensation relieved by laying on left side at night, using a heating pad at night, and Aleve.  Patient took Aleve this morning however it did not work.  Patient notes upper back/arm feel little bit better this morning.  Pt denies weakness in LUE, injury, pushing, pulling, heavy lifting.  Pt does note tossing and turning at night when sleeping.  Past Medical History:  Diagnosis Date  . Allergy   . Anemia   . Angio-edema   . Asthma    ALL THE TIME   LAST FLARE UP 12/2018  . Chest pain 08/2019  . Chicken pox   . Diabetes mellitus without complication ([place])   . GERD (gastroesophageal reflux disease)   . Hyperlipidemia   . Hypertension   . Hyperthyroidism   . Recurrent upper respiratory infection (URI)   . Schizophrenia ([place])   . Sleep apnea    DX 2-3 YRS AGO.  Marland Kitchen Thyroid disease   . Urticaria     Allergies  Allergen Reactions  . Bee Pollen Cough  . Pollen Extract Cough  . Latex Itching, Rash and Other (See Comments)    Severe itching    ROS General: Denies fever, chills, night sweats, changes in weight, changes in appetite HEENT: Denies headaches, ear pain, changes in vision, rhinorrhea, sore throat CV: Denies CP, palpitations, SOB, orthopnea Pulm: Denies SOB, cough, wheezing GI: Denies abdominal pain, nausea, vomiting, diarrhea, constipation GU: Denies dysuria, hematuria, frequency, vaginal discharge Msk: Denies muscle cramps, joint pains  +L upper back pain Neuro: Denies weakness   + numbness and tingling in LUE Skin: Denies rashes, bruising Psych: Denies depression, anxiety, hallucinations      Objective:     Blood pressure 118/78, pulse 90, temperature 98.2 F (36.8 C), temperature source Oral, weight 262 lb (118.8 kg), last menstrual period 02/02/2017, SpO2 96 %.  Gen. Pleasant, well-nourished, in no distress, normal affect   HEENT: /AT, face symmetric, conjunctiva clear, no scleral icterus, PERRLA, EOMI, nares patent without drainage Lungs: no accessory muscle use, CTAB, no wheezes or rales Cardiovascular: RRR, no m/r/g, no peripheral edema Musculoskeletal: TTP of upper thoracic spine and left paraspinal muscle, rhomboid major.  Tightness of bilateral upper trapezius muscles.  Pain, numbness, tingling with axial loading.  Negative Tinel and Phalen's no deformities, no cyanosis or clubbing, normal tone Neuro:  A&Ox3, CN II-XII intact, normal gait Skin:  Warm, no lesions/ rash   Wt Readings from Last 3 Encounters:  07/28/20 261 lb (118.4 kg)  07/28/20 261 lb (118.4 kg)  11/14/19 254 lb (115.2 kg)    Lab Results  Component Value Date   WBC 8.7 07/28/2020   HGB 10.7 (L) 07/28/2020   HCT 33.7 (L) 07/28/2020   PLT 260 07/28/2020   GLUCOSE 104 (H) 07/28/2020   CHOL 156 07/28/2020   TRIG 83 07/28/2020   HDL 70 07/28/2020   LDLDIRECT 97.1 01/14/2014   LDLCALC 69 07/28/2020   ALT 19 07/28/2020   AST 20 07/28/2020   NA 140 07/28/2020   K 4.5 07/28/2020   CL 104 07/28/2020   CREATININE 1.14 (H)  07/28/2020   BUN 24 07/28/2020   CO2 28 07/28/2020   TSH 3.27 07/28/2020   HGBA1C 6.3 (H) 07/28/2020   MICROALBUR 4.2 (H) 07/25/2019    Assessment/Plan:  Cervical radiculopathy  -discussed various causes including bone spurs, degenerative changes in spine,  -labs obtained 07/28/20 reviewed including hemoglobin A1c 6.3%, normal TSH and free T4.  Creatinine elevated at 1.14 which is improved.  Anemia noted as hemoglobin 10.7 -supportive care -further recs based on imaging. -given handout - Plan: DG Cervical Spine Complete  Anemia -Hemoglobin 10.7 on 07/28/2020 -Consider starting  iron -We will obtain additional labs  F/u prn  Grier Mitts, MD

## 2020-08-08 ENCOUNTER — Encounter: Payer: Self-pay | Admitting: Family Medicine

## 2020-08-08 LAB — CBC
HCT: 32.6 % — ABNORMAL LOW (ref 35.0–45.0)
Hemoglobin: 10.3 g/dL — ABNORMAL LOW (ref 11.7–15.5)
MCH: 27.2 pg (ref 27.0–33.0)
MCHC: 31.6 g/dL — ABNORMAL LOW (ref 32.0–36.0)
MCV: 86 fL (ref 80.0–100.0)
MPV: 9.7 fL (ref 7.5–12.5)
Platelets: 267 10*3/uL (ref 140–400)
RBC: 3.79 10*6/uL — ABNORMAL LOW (ref 3.80–5.10)
RDW: 13.2 % (ref 11.0–15.0)
WBC: 8.6 10*3/uL (ref 3.8–10.8)

## 2020-08-08 LAB — IRON, TOTAL/TOTAL IRON BINDING CAP
%SAT: 16 % (calc) (ref 16–45)
Iron: 62 ug/dL (ref 45–160)
TIBC: 378 mcg/dL (calc) (ref 250–450)

## 2020-08-08 LAB — VITAMIN B12: Vitamin B-12: 1014 pg/mL (ref 200–1100)

## 2020-08-08 LAB — FOLATE: Folate: 22.6 ng/mL

## 2020-08-13 DIAGNOSIS — R2689 Other abnormalities of gait and mobility: Secondary | ICD-10-CM | POA: Diagnosis not present

## 2020-08-13 DIAGNOSIS — R609 Edema, unspecified: Secondary | ICD-10-CM | POA: Diagnosis not present

## 2020-08-13 DIAGNOSIS — M7661 Achilles tendinitis, right leg: Secondary | ICD-10-CM | POA: Diagnosis not present

## 2020-08-13 DIAGNOSIS — M24572 Contracture, left ankle: Secondary | ICD-10-CM | POA: Diagnosis not present

## 2020-08-13 DIAGNOSIS — M24571 Contracture, right ankle: Secondary | ICD-10-CM | POA: Diagnosis not present

## 2020-08-13 DIAGNOSIS — M722 Plantar fascial fibromatosis: Secondary | ICD-10-CM | POA: Diagnosis not present

## 2020-08-22 ENCOUNTER — Telehealth: Payer: Self-pay | Admitting: Allergy & Immunology

## 2020-08-22 NOTE — Telephone Encounter (Signed)
Changed to televisit.

## 2020-08-22 NOTE — Telephone Encounter (Signed)
Pt was scheduled for appointment and seen at the office by Dr Volanda Napoleon

## 2020-08-22 NOTE — Telephone Encounter (Signed)
That is reasonable. We can change to a televisit and do a in person visit next time.  Salvatore Marvel, MD [facility]

## 2020-08-22 NOTE — Telephone Encounter (Signed)
Patient wanted to change ov on 9/21 to a televisit, informed patient that she needs a spiro and would have to come into the office for that. Patient was unaware that spiro was needed and does not feel comfortable coming into the office with COVID cases increasing.   Should patient reschedule until she can come into the office or should she have a televisit on 9/21?

## 2020-08-26 ENCOUNTER — Ambulatory Visit (INDEPENDENT_AMBULATORY_CARE_PROVIDER_SITE_OTHER): Payer: Medicare HMO | Admitting: Allergy & Immunology

## 2020-08-26 ENCOUNTER — Other Ambulatory Visit: Payer: Self-pay

## 2020-08-26 ENCOUNTER — Encounter: Payer: Self-pay | Admitting: Allergy & Immunology

## 2020-08-26 DIAGNOSIS — J302 Other seasonal allergic rhinitis: Secondary | ICD-10-CM

## 2020-08-26 DIAGNOSIS — J4540 Moderate persistent asthma, uncomplicated: Secondary | ICD-10-CM | POA: Diagnosis not present

## 2020-08-26 DIAGNOSIS — J339 Nasal polyp, unspecified: Secondary | ICD-10-CM | POA: Diagnosis not present

## 2020-08-26 DIAGNOSIS — J3089 Other allergic rhinitis: Secondary | ICD-10-CM | POA: Diagnosis not present

## 2020-08-26 DIAGNOSIS — K9049 Malabsorption due to intolerance, not elsewhere classified: Secondary | ICD-10-CM

## 2020-08-26 NOTE — Patient Instructions (Addendum)
1. Seasonal and perennial allergic rhinitis - Continue with the Flonase two sprays per nostril daily.  - Continue with Claritin one tablet daily.  2. Moderate persistent asthma - not well controlled - Lung testing not done today.  - Daily controller medication(s): Advair 250/50 one puff once daily - Prior to physical activity: albuterol 2 puffs 10-15 minutes before physical activity. - Rescue medications: albuterol 4 puffs every 4-6 hours as needed or albuterol nebulizer one vial every 4-6 hours as needed - Asthma control goals:  * Full participation in all desired activities (may need albuterol before activity) * Albuterol use two time or less a week on average (not counting use with activity) * Cough interfering with sleep two time or less a month * Oral steroids no more than once a year * No hospitalizations  3. Nasal polyposis - Continue with your current management.  - Continue to follow with ENT.   4. Return in about 6 months (around 02/23/2021).    Please inform us of any Emergency Department visits, hospitalizations, or changes in symptoms. Call us before going to the ED for breathing or allergy symptoms since we might be able to fit you in for a sick visit. Feel free to contact us anytime with any questions, problems, or concerns.  It was a pleasure to talk to you today today!  Websites that have reliable patient information: 1. American Academy of Asthma, Allergy, and Immunology: www.aaaai.org 2. Food Allergy Research and Education (FARE): foodallergy.org 3. Mothers of Asthmatics: http://www.asthmacommunitynetwork.org 4. American College of Allergy, Asthma, and Immunology: www.acaai.org   COVID-19 Vaccine Information can be found at: ShippingScam.co.uk For questions related to vaccine distribution or appointments, please email vaccine@[facility] .com or call [phone].     Like [place] on [place] and Instagram  for our latest updates!        Make sure you are registered to vote! If you have moved or changed any of your contact information, you will need to get this updated before voting!  In some cases, you MAY be able to register to vote online: [email or web address]

## 2020-08-26 NOTE — Progress Notes (Signed)
RE: Maria Franklin MRN: [id number] DOB: 06-17-1961 Date of Telemedicine Visit: 08/26/2020  Referring provider: Billie Ruddy, MD Primary care provider: Billie Ruddy, MD  Chief Complaint: Asthma (doing well. no issues or shortness of breath. no steroids or antibiotics since last visit. )   Telemedicine Follow Up Visit via Telephone: I connected with Maria Franklin for a follow up on 08/26/20 by telephone and verified that I am speaking with the correct person using two identifiers.   I discussed the limitations, risks, security and privacy concerns of performing an evaluation and management service by telephone and the availability of in person appointments. I also discussed with the patient that there may be a patient responsible charge related to this service. The patient expressed understanding and agreed to proceed.  Patient is at home.  Provider is at the office.  Visit start time: 10:52 AM Visit end time: 11:15 AM Insurance consent/check in by: [facility] consent and medical assistant/nurse: Kayla  History of Present Illness:  She is a 59 y.o. female, who is being followed for mild persistent asthma as well as seasonal perennial allergic rhinitis and nasal polyps. She also has food allergies. Her previous allergy office visit was in May 2021 with myself.  She has a history of not being able to afford her medications.  At the last visit, we changed her back to Advair 250/50 mcg 1 puff twice daily since this was the cheaper option for her.  We have tried using Dupixent for her because of her history of nasal polyposis, but she had an adverse reaction to it.  For her allergic rhinitis, we have discussed allergen immunotherapy, but she has never been able to work this into her schedule.  Since the last visit, she has remained fairly stable.  Asthma/Respiratory Symptom History: She remains on the Advair one puff twice daily. This is the least unafforadable at this point in the  time. Seh does not have the jo any longer and does not have the insurance to pay for it. She has been on Symbicort, but it was sanme cost as Advair and she preferred the activity of Advair. She did have [place] [facility], but with Medicare she did not qualify. She has been on Breo one puff once daily.  She uses the medication only once per day to try to make it last longer.  She is not interested in trying any more Biologics.  She said she has tried all of the inhalers and Advair seems to be the cheapest.  She has been on Flovent and other ICS alone, but she was not very well controlled on it.  Nasal Polyp Nasal History: She reports that these are well controlled. She was having some issues with her sinuses over the last few weeks. She thinks she was eating something that caused nasal congestion? She has been on a diet recently which seems to make her polyps less of an issue.  She has not seen ENT in a while.  Her last nasal polyp surgery was in 2020.  Allergic Rhinitis Symptom History: She is on Claritin now. She stopped the montelukast. She does not feel that her symptoms worsened without the montelukast on board.    She is fully vaccinated with Ruso (completed April 2021).    Otherwise, there have been no changes to her past medical history, surgical history, family history, or social history.  Asses she has not received a booster.  Ment and Plan:  Maria Franklin is a 59  y.o. female with:  Seasonal and perennial allergic rhinitis(grasses, weeds, trees, indoor and outdoor molds, dust mite, roach)  Moderate persistent asthma without complication  Adverse reaction to Dupixent- unclear causation  Nasal polyposis- s/pthreesinus surgeries (1990s, 2013, 2020)  Adverse food reactions (nuts, mushrooms) - with negative testing the past  Pruritus- controlledwith emollients alone  Difficulty affording medications - not interested in trying another biologic (which ironically would be more affordable  for her)  Fully vaccinated to COVID-19     1. Seasonal and perennial allergic rhinitis - Continue with the Flonase two sprays per nostril daily.  - Continue with Claritin one tablet daily.  2. Moderate persistent asthma - not well controlled - Lung testing not done today.  - Daily controller medication(s): Advair 250/50 one puff once daily - Prior to physical activity: albuterol 2 puffs 10-15 minutes before physical activity. - Rescue medications: albuterol 4 puffs every 4-6 hours as needed or albuterol nebulizer one vial every 4-6 hours as needed - Asthma control goals:  * Full participation in all desired activities (may need albuterol before activity) * Albuterol use two time or less a week on average (not counting use with activity) * Cough interfering with sleep two time or less a month * Oral steroids no more than once a year * No hospitalizations  3. Nasal polyposis - Continue with your current management.  - Continue to follow with ENT.   4. Return in about 6 months (around 02/23/2021).    Diagnostics: None.  Medication List:  Current Outpatient Medications  Medication Sig Dispense Refill  . albuterol (VENTOLIN HFA) 108 (90 Base) MCG/ACT inhaler INHALE ONE TO TWO PUFFS INTO LUNGS EVERY 6 HOURS AS NEEDED FOR WHEEZING OR SHORTNESS OF BREATH 8 g 5  . amLODipine (NORVASC) 10 MG tablet Take 1 tablet (10 mg total) by mouth daily. 90 tablet 3  . aspirin EC 81 MG EC tablet Take 1 tablet (81 mg total) by mouth daily. 30 tablet 0  . atorvastatin (LIPITOR) 40 MG tablet Take 1 tablet (40 mg total) by mouth daily at 6 PM. 90 tablet 3  . carvedilol (COREG) 12.5 MG tablet Take 1 tablet (12.5 mg total) by mouth 2 (two) times daily with a meal. 180 tablet 3  . Ferrous Sulfate (IRON) 325 (65 Fe) MG TABS Take 1 tablet by mouth every morning.    . fluticasone (FLONASE) 50 MCG/ACT nasal spray Place 2 sprays into both nostrils 2 (two) times a day. (Patient taking differently: Place 2 sprays  into both nostrils daily. ) 16 g 5  . Fluticasone-Salmeterol (ADVAIR DISKUS) 250-50 MCG/DOSE AEPB Inhale 1 puff into the lungs 2 (two) times daily. 180 each 1  . haloperidol (HALDOL) 5 MG tablet Take 0.5 tablets (2.5 mg total) by mouth at bedtime. Take 1/2 tab at bed time 30 tablet 1  . hydrALAZINE (APRESOLINE) 25 MG tablet Take 0.5 tablets (12.5 mg total) by mouth in the morning and at bedtime. 180 tablet 3  . levothyroxine (SYNTHROID, LEVOTHROID) 50 MCG tablet Take 1 tablet (50 mcg total) by mouth daily before breakfast. MUST SCHEDULE ANNUAL PHYSICAL 90 tablet 0  . lisinopril (ZESTRIL) 40 MG tablet Take 1 tablet (40 mg total) by mouth daily. 90 tablet 3   No current facility-administered medications for this visit.   Allergies: Allergies  Allergen Reactions  . Bee Pollen Cough  . Pollen Extract Cough  . Latex Itching, Rash and Other (See Comments)    Severe itching   I reviewed her  past medical history, social history, family history, and environmental history and no significant changes have been reported from previous visits.  Review of Systems  Constitutional: Negative for activity change, appetite change, chills, diaphoresis and fatigue.  HENT: Negative for congestion, postnasal drip, rhinorrhea, sinus pressure and sore throat.   Eyes: Negative for pain, discharge, redness and itching.  Respiratory: Negative for shortness of breath, wheezing and stridor.   Gastrointestinal: Negative for diarrhea, nausea and vomiting.  Musculoskeletal: Negative for arthralgias, joint swelling and myalgias.  Skin: Negative for rash.  Allergic/Immunologic: Negative for environmental allergies and food allergies.    Objective:  Physical exam not obtained as encounter was done via telephone.   Previous notes and tests were reviewed.  I discussed the assessment and treatment plan with the patient. The patient was provided an opportunity to ask questions and all were answered. The patient agreed  with the plan and demonstrated an understanding of the instructions.   The patient was advised to call back or seek an in-person evaluation if the symptoms worsen or if the condition fails to improve as anticipated.  I provided 23 minutes of non-face-to-face time during this encounter.  It was my pleasure to participate in [place] care today. Please feel free to contact me with any questions or concerns.   Sincerely,  Valentina Shaggy, MD

## 2020-08-27 DIAGNOSIS — M24571 Contracture, right ankle: Secondary | ICD-10-CM | POA: Diagnosis not present

## 2020-08-27 DIAGNOSIS — M7661 Achilles tendinitis, right leg: Secondary | ICD-10-CM | POA: Diagnosis not present

## 2020-08-27 DIAGNOSIS — M24572 Contracture, left ankle: Secondary | ICD-10-CM | POA: Diagnosis not present

## 2020-08-27 DIAGNOSIS — M722 Plantar fascial fibromatosis: Secondary | ICD-10-CM | POA: Diagnosis not present

## 2020-09-02 ENCOUNTER — Ambulatory Visit: Payer: Self-pay | Admitting: Allergy & Immunology

## 2020-09-09 DIAGNOSIS — E119 Type 2 diabetes mellitus without complications: Secondary | ICD-10-CM | POA: Diagnosis not present

## 2020-09-09 LAB — HM DIABETES EYE EXAM

## 2020-09-10 DIAGNOSIS — M7732 Calcaneal spur, left foot: Secondary | ICD-10-CM | POA: Diagnosis not present

## 2020-09-10 DIAGNOSIS — M792 Neuralgia and neuritis, unspecified: Secondary | ICD-10-CM | POA: Diagnosis not present

## 2020-09-10 DIAGNOSIS — M722 Plantar fascial fibromatosis: Secondary | ICD-10-CM | POA: Diagnosis not present

## 2020-09-10 DIAGNOSIS — M7661 Achilles tendinitis, right leg: Secondary | ICD-10-CM | POA: Diagnosis not present

## 2020-09-10 DIAGNOSIS — M7731 Calcaneal spur, right foot: Secondary | ICD-10-CM | POA: Diagnosis not present

## 2020-09-15 ENCOUNTER — Telehealth: Payer: Self-pay | Admitting: Family Medicine

## 2020-09-15 NOTE — Telephone Encounter (Signed)
levothyroxine (SYNTHROID, LEVOTHROID) 50 MCG tablet    [id number] [place], [place] - [address] AT [place] Phone:  [phone]  Fax:  [phone]      Patient had her yearly physical 07/28/2020 with Dr. Volanda Napoleon

## 2020-09-16 ENCOUNTER — Other Ambulatory Visit: Payer: Self-pay | Admitting: *Deleted

## 2020-09-16 DIAGNOSIS — E039 Hypothyroidism, unspecified: Secondary | ICD-10-CM

## 2020-09-16 MED ORDER — LEVOTHYROXINE SODIUM 50 MCG PO TABS: 50.0000 ug | ORAL_TABLET | Freq: Every day | ORAL | 0 refills | Status: DC

## 2020-09-16 NOTE — Telephone Encounter (Signed)
Medication refill sent to pharmacy  

## 2020-09-24 DIAGNOSIS — M7731 Calcaneal spur, right foot: Secondary | ICD-10-CM | POA: Diagnosis not present

## 2020-09-24 DIAGNOSIS — M792 Neuralgia and neuritis, unspecified: Secondary | ICD-10-CM | POA: Diagnosis not present

## 2020-09-24 DIAGNOSIS — M24571 Contracture, right ankle: Secondary | ICD-10-CM | POA: Diagnosis not present

## 2020-09-24 DIAGNOSIS — M722 Plantar fascial fibromatosis: Secondary | ICD-10-CM | POA: Diagnosis not present

## 2020-09-24 DIAGNOSIS — M7661 Achilles tendinitis, right leg: Secondary | ICD-10-CM | POA: Diagnosis not present

## 2020-10-01 DIAGNOSIS — R69 Illness, unspecified: Secondary | ICD-10-CM | POA: Diagnosis not present

## 2020-10-13 ENCOUNTER — Telehealth (HOSPITAL_COMMUNITY): Payer: Medicare HMO | Admitting: Psychiatry

## 2020-10-14 ENCOUNTER — Telehealth (HOSPITAL_COMMUNITY): Payer: Medicare HMO | Admitting: Psychiatry

## 2020-10-23 DIAGNOSIS — M24571 Contracture, right ankle: Secondary | ICD-10-CM | POA: Diagnosis not present

## 2020-10-23 DIAGNOSIS — M7731 Calcaneal spur, right foot: Secondary | ICD-10-CM | POA: Diagnosis not present

## 2020-10-23 DIAGNOSIS — M7661 Achilles tendinitis, right leg: Secondary | ICD-10-CM | POA: Diagnosis not present

## 2020-10-23 DIAGNOSIS — M722 Plantar fascial fibromatosis: Secondary | ICD-10-CM | POA: Diagnosis not present

## 2020-11-11 ENCOUNTER — Telehealth (INDEPENDENT_AMBULATORY_CARE_PROVIDER_SITE_OTHER): Payer: Medicare HMO | Admitting: Psychiatry

## 2020-11-11 ENCOUNTER — Encounter (HOSPITAL_COMMUNITY): Payer: Self-pay | Admitting: Psychiatry

## 2020-11-11 ENCOUNTER — Other Ambulatory Visit: Payer: Self-pay

## 2020-11-11 DIAGNOSIS — F200 Paranoid schizophrenia: Secondary | ICD-10-CM

## 2020-11-11 DIAGNOSIS — R69 Illness, unspecified: Secondary | ICD-10-CM | POA: Diagnosis not present

## 2020-11-11 MED ORDER — HALOPERIDOL 5 MG PO TABS: 2.5000 mg | ORAL_TABLET | Freq: Every day | ORAL | 3 refills | Status: DC

## 2020-11-11 NOTE — Progress Notes (Signed)
Virtual Visit via Telephone Note  I connected with Maria Franklin on 11/11/20 at  3:00 PM EST by telephone and verified that I am speaking with the correct person using two identifiers.  Location: Patient: Home Provider: Home Office   I discussed the limitations, risks, security and privacy concerns of performing an evaluation and management service by telephone and the availability of in person appointments. I also discussed with the patient that there may be a patient responsible charge related to this service. The patient expressed understanding and agreed to proceed.   History of Present Illness: Patient is evaluated by phone session.  She is taking Haldol 2.5 mg at bedtime.  She feel it is helping her paranoia and denies any recent agitation, irritability, mood swings.  Sleep is good.  She has chronic arthritis sometimes that causes pain but otherwise she feels her sleep is good.  She is happy because her son came from [place] for Thanksgiving.  She had a good time.  She lives with her husband who is very supportive.  She has no tremors, shakes or any EPS.  She is compliant with medication and does not want to stop because she feels it does work very well for her paranoia.  Ready level is okay.  Her appetite is okay.  She denies drinking or using any illegal substances.  Psychiatric History:Reviewed. H/Oparanoia and delusions.H/O overdose and [facility].H/O overdose and inpatient[facility].Readmitted in 4 months due tononcompliant with medication.Last admission at [facility] in Nov 2020.Abilifyworked butcould not afford. Noh/o abuse.   Psychiatric Specialty Exam: Physical Exam  Review of Systems  Weight 260 lb (117.9 kg), last menstrual period 02/02/2017.There is no height or weight on file to calculate BMI.  General Appearance: NA  Eye Contact:  NA  Speech:  Slow  Volume:  Normal  Mood:  Euthymic  Affect:  NA  Thought  Process:  Goal Directed  Orientation:  Full (Time, Place, and Person)  Thought Content:  WDL  Suicidal Thoughts:  No  Homicidal Thoughts:  No  Memory:  Immediate;   Good Recent;   Good Remote;   Good  Judgement:  Good  Insight:  Present  Psychomotor Activity:  NA  Concentration:  Concentration: Good and Attention Span: Good  Recall:  Good  Fund of Knowledge:  Good  Language:  Good  Akathisia:  No  Handed:  Right  AIMS (if indicated):     Assets:  Communication Skills Desire for Improvement Housing Resilience Social Support Transportation  ADL's:  Intact  Cognition:  WNL  Sleep:   ok      Assessment and Plan: Schizophrenia chronic paranoid type.  Continue Haldol 2.5 mg at bedtime.  Patient like to have a follow-up in 6 months.  She reported no side effects.  Discussed medication side effects and benefits.  She is not interested in therapy.  Recommended to call us back if she has any question or any concern.  Follow-up in 6 months.  Follow Up Instructions:    I discussed the assessment and treatment plan with the patient. The patient was provided an opportunity to ask questions and all were answered. The patient agreed with the plan and demonstrated an understanding of the instructions.   The patient was advised to call back or seek an in-person evaluation if the symptoms worsen or if the condition fails to improve as anticipated.  I provided 12 minutes of non-face-to-face time during this encounter.   Arlyce Harman  Adele Schilder, MD

## 2020-11-18 ENCOUNTER — Other Ambulatory Visit: Payer: Self-pay

## 2020-11-18 ENCOUNTER — Emergency Department (HOSPITAL_COMMUNITY)
Admission: EM | Admit: 2020-11-18 | Discharge: 2020-11-18 | Disposition: A | Discharge status: Home / Self Care | Payer: Medicare HMO | Attending: Emergency Medicine | Admitting: Emergency Medicine

## 2020-11-18 ENCOUNTER — Emergency Department (HOSPITAL_COMMUNITY): Admit: 2020-11-18 | Payer: Medicare HMO

## 2020-11-18 ENCOUNTER — Emergency Department (HOSPITAL_BASED_OUTPATIENT_CLINIC_OR_DEPARTMENT_OTHER)
Admit: 2020-11-18 | Discharge: 2020-11-18 | Disposition: A | Discharge status: Home / Self Care | Payer: Medicare HMO | Attending: Emergency Medicine | Admitting: Emergency Medicine

## 2020-11-18 ENCOUNTER — Emergency Department (HOSPITAL_COMMUNITY): Payer: Medicare HMO

## 2020-11-18 ENCOUNTER — Encounter (HOSPITAL_COMMUNITY): Payer: Self-pay

## 2020-11-18 DIAGNOSIS — E1122 Type 2 diabetes mellitus with diabetic chronic kidney disease: Secondary | ICD-10-CM | POA: Diagnosis not present

## 2020-11-18 DIAGNOSIS — I129 Hypertensive chronic kidney disease with stage 1 through stage 4 chronic kidney disease, or unspecified chronic kidney disease: Secondary | ICD-10-CM | POA: Diagnosis not present

## 2020-11-18 DIAGNOSIS — R079 Chest pain, unspecified: Secondary | ICD-10-CM | POA: Diagnosis not present

## 2020-11-18 DIAGNOSIS — Z7982 Long term (current) use of aspirin: Secondary | ICD-10-CM | POA: Insufficient documentation

## 2020-11-18 DIAGNOSIS — S76911A Strain of unspecified muscles, fascia and tendons at thigh level, right thigh, initial encounter: Secondary | ICD-10-CM | POA: Diagnosis not present

## 2020-11-18 DIAGNOSIS — G44209 Tension-type headache, unspecified, not intractable: Secondary | ICD-10-CM | POA: Diagnosis not present

## 2020-11-18 DIAGNOSIS — J45909 Unspecified asthma, uncomplicated: Secondary | ICD-10-CM | POA: Insufficient documentation

## 2020-11-18 DIAGNOSIS — R0789 Other chest pain: Secondary | ICD-10-CM | POA: Diagnosis not present

## 2020-11-18 DIAGNOSIS — X58XXXA Exposure to other specified factors, initial encounter: Secondary | ICD-10-CM | POA: Insufficient documentation

## 2020-11-18 DIAGNOSIS — Z91040 Latex allergy status: Secondary | ICD-10-CM | POA: Diagnosis not present

## 2020-11-18 DIAGNOSIS — Z7952 Long term (current) use of systemic steroids: Secondary | ICD-10-CM | POA: Diagnosis not present

## 2020-11-18 DIAGNOSIS — M549 Dorsalgia, unspecified: Secondary | ICD-10-CM | POA: Insufficient documentation

## 2020-11-18 DIAGNOSIS — N183 Chronic kidney disease, stage 3 unspecified: Secondary | ICD-10-CM | POA: Diagnosis not present

## 2020-11-18 DIAGNOSIS — S79921A Unspecified injury of right thigh, initial encounter: Secondary | ICD-10-CM | POA: Diagnosis present

## 2020-11-18 DIAGNOSIS — R609 Edema, unspecified: Secondary | ICD-10-CM | POA: Diagnosis not present

## 2020-11-18 DIAGNOSIS — E039 Hypothyroidism, unspecified: Secondary | ICD-10-CM | POA: Diagnosis not present

## 2020-11-18 DIAGNOSIS — S39012A Strain of muscle, fascia and tendon of lower back, initial encounter: Secondary | ICD-10-CM | POA: Diagnosis not present

## 2020-11-18 DIAGNOSIS — Z79899 Other long term (current) drug therapy: Secondary | ICD-10-CM | POA: Diagnosis not present

## 2020-11-18 DIAGNOSIS — T148XXA Other injury of unspecified body region, initial encounter: Secondary | ICD-10-CM

## 2020-11-18 DIAGNOSIS — R519 Headache, unspecified: Secondary | ICD-10-CM | POA: Diagnosis not present

## 2020-11-18 DIAGNOSIS — Z87891 Personal history of nicotine dependence: Secondary | ICD-10-CM | POA: Diagnosis not present

## 2020-11-18 LAB — CBC WITH DIFFERENTIAL/PLATELET
Abs Immature Granulocytes: 0.02 10*3/uL (ref 0.00–0.07)
Basophils Absolute: 0 10*3/uL (ref 0.0–0.1)
Basophils Relative: 0 %
Eosinophils Absolute: 0.4 10*3/uL (ref 0.0–0.5)
Eosinophils Relative: 5 %
HCT: 33.9 % — ABNORMAL LOW (ref 36.0–46.0)
Hemoglobin: 10.4 g/dL — ABNORMAL LOW (ref 12.0–15.0)
Immature Granulocytes: 0 %
Lymphocytes Relative: 38 %
Lymphs Abs: 2.6 10*3/uL (ref 0.7–4.0)
MCH: 27.3 pg (ref 26.0–34.0)
MCHC: 30.7 g/dL (ref 30.0–36.0)
MCV: 89 fL (ref 80.0–100.0)
Monocytes Absolute: 0.5 10*3/uL (ref 0.1–1.0)
Monocytes Relative: 7 %
Neutro Abs: 3.5 10*3/uL (ref 1.7–7.7)
Neutrophils Relative %: 50 %
Platelets: 266 10*3/uL (ref 150–400)
RBC: 3.81 MIL/uL — ABNORMAL LOW (ref 3.87–5.11)
RDW: 13.4 % (ref 11.5–15.5)
WBC: 7 10*3/uL (ref 4.0–10.5)
nRBC: 0 % (ref 0.0–0.2)

## 2020-11-18 LAB — BASIC METABOLIC PANEL
Anion gap: 6 (ref 5–15)
BUN: 24 mg/dL — ABNORMAL HIGH (ref 6–20)
CO2: 28 mmol/L (ref 22–32)
Calcium: 9.1 mg/dL (ref 8.9–10.3)
Chloride: 102 mmol/L (ref 98–111)
Creatinine, Ser: 1.39 mg/dL — ABNORMAL HIGH (ref 0.44–1.00)
GFR, Estimated: 44 mL/min — ABNORMAL LOW (ref >60)
Glucose, Bld: 120 mg/dL — ABNORMAL HIGH (ref 70–99)
Potassium: 4.6 mmol/L (ref 3.5–5.1)
Sodium: 136 mmol/L (ref 135–145)

## 2020-11-18 LAB — TROPONIN I (HIGH SENSITIVITY)
Troponin I (High Sensitivity): 11 ng/L (ref <18)
Troponin I (High Sensitivity): 10 ng/L (ref <18)

## 2020-11-18 MED ORDER — ACETAMINOPHEN 325 MG PO TABS
650.0000 mg | ORAL_TABLET | Freq: Once | ORAL | Status: AC
  Administered 2020-11-18: 650 mg via ORAL
  Filled 2020-11-18: qty 2

## 2020-11-18 MED ORDER — METHOCARBAMOL 500 MG PO TABS: 500.0000 mg | ORAL_TABLET | Freq: Two times a day (BID) | ORAL | 0 refills | Status: DC

## 2020-11-18 MED ORDER — METHOCARBAMOL 500 MG PO TABS
500.0000 mg | ORAL_TABLET | Freq: Once | ORAL | Status: AC
  Administered 2020-11-18: 500 mg via ORAL
  Filled 2020-11-18: qty 1

## 2020-11-18 NOTE — Progress Notes (Signed)
Right lower extremity venous duplex has been completed. Preliminary results can be found in CV Proc through chart review.  Results were given to [facility] PA.  11/18/20 11:02 AM Carlos Levering RVT

## 2020-11-18 NOTE — ED Provider Notes (Signed)
[facility] DEPT Provider Note   CSN: [id number] Arrival date & time: 11/18/20  7902     History Chief Complaint  Patient presents with  . Pain    Maria Franklin is a 59 y.o. female with a past medical history of diabetes, GERD, hypertension, schizophrenia presenting to the ED with multiple complaints. All of her symptoms began approximately 4 days ago and have been intermittent.  1. Headache. Reports a left-sided intermittent headache. Minimal improvement noted with home Tylenol and oxycodone. Denies any vision changes, vomiting, numbness in arms or legs, injuries or falls. Reports history of headache but states that this is different. No prior stroke or hemorrhage.  2. Reports right upper thigh pain. Denies any injury or trauma. Pain is worse with movement. Denies history of DVT or PE, recent immobilization.  3. Reports chronic left-sided back pain that is now radiating to the chest. This also began 4 days ago and has been intermittent without specific aggravating alleviating factor. Initially she was told her back pain was due to a pinched nerve. Reports some shortness of breath associated with the pain. No history of MI. Denies any cough, hemoptysis, fever or sick contacts with similar symptoms. She has been vaccinated against Covid.  HPI     Past Medical History:  Diagnosis Date  . Allergy   . Anemia   . Angio-edema   . Asthma    ALL THE TIME   LAST FLARE UP 12/2018  . Chest pain 08/2019  . Chicken pox   . Diabetes mellitus without complication ([place])   . GERD (gastroesophageal reflux disease)   . Hyperlipidemia   . Hypertension   . Hyperthyroidism   . Recurrent upper respiratory infection (URI)   . Schizophrenia ([place])   . Sleep apnea    DX 2-3 YRS AGO.  Marland Kitchen Thyroid disease   . Urticaria     Patient Active Problem List   Diagnosis Date Noted  . Paranoid schizophrenia ([place]) 10/16/2019  . Hypertensive emergency 08/30/2019  . Chest pain  08/29/2019  . CKD (chronic kidney disease), stage III ([place]) 08/29/2019  . Hypertensive urgency 08/29/2019  . OSA (obstructive sleep apnea) 04/29/2016  . GERD (gastroesophageal reflux disease) 10/28/2015  . Asthma, mild persistent 04/21/2015  . Paranoid schizophrenia, chronic condition ([place]) 04/19/2014  . Seasonal allergies 04/19/2014  . DM2 (diabetes mellitus, type 2) ([place]) 04/19/2014  . Angina, [place] 12/30/2013  . Hypertension 01/10/2013  . Hyperlipidemia 01/10/2013  . Hypothyroidism 01/10/2013    Past Surgical History:  Procedure Laterality Date  . left adrenal gland removal  07/2019  . SINOSCOPY    . SINUS ENDO W/FUSION Bilateral 01/05/2019   Procedure: ENDOSCOPIC SINUS SURGERY WITH NAVIGATION;  Surgeon: Jerrell Belfast, MD;  Location: [facility];  Service: ENT;  Laterality: Bilateral;  . SINUS IRRIGATION    . TONSILLECTOMY    . TURBINATE REDUCTION N/A 01/05/2019   Procedure: Turbinate Reduction;  Surgeon: Jerrell Belfast, MD;  Location: [facility];  Service: ENT;  Laterality: N/A;     OB History   No obstetric history on file.     Family History  Problem Relation Age of Onset  . Mental illness Mother   . Hypertension Mother   . Schizophrenia Mother   . Depression Mother   . Cancer Father        LUNG  . Cancer Sister        BREAST  . Breast cancer Sister   . Mental illness Brother   .  Hypertension Brother   . Schizophrenia Brother   . Depression Brother   . Heart disease Maternal Grandmother   . Depression Maternal Grandmother   . Schizophrenia Maternal Grandmother   . Schizophrenia Brother   . Schizophrenia Maternal Uncle   . Breast cancer Maternal Aunt   . Heart attack Sister   . Heart attack Sister   . Heart attack Sister   . Allergic rhinitis Neg Hx   . Angioedema Neg Hx   . Asthma Neg Hx   . Atopy Neg Hx   . Eczema Neg Hx   . Immunodeficiency Neg Hx   . Urticaria Neg Hx     Social History   Tobacco Use  . Smoking status: Former Smoker    Packs/day:  0.25    Years: 6.00    Pack years: 1.50  . Smokeless tobacco: Never Used  . Tobacco comment: QUIT IN HER EARLY 20'S  Vaping Use  . Vaping Use: Never used  Substance Use Topics  . Alcohol use: No    Alcohol/week: 0.0 standard drinks  . Drug use: No    Home Medications Prior to Admission medications   Medication Sig Start Date End Date Taking? Authorizing Provider  albuterol (VENTOLIN HFA) 108 (90 Base) MCG/ACT inhaler INHALE ONE TO TWO PUFFS INTO LUNGS EVERY 6 HOURS AS NEEDED FOR WHEEZING OR SHORTNESS OF BREATH 09/13/19   Billie Ruddy, MD  amLODipine (NORVASC) 10 MG tablet Take 1 tablet (10 mg total) by mouth daily. 01/23/20   Billie Ruddy, MD  aspirin EC 81 MG EC tablet Take 1 tablet (81 mg total) by mouth daily. 09/01/19   Hosie Poisson, MD  atorvastatin (LIPITOR) 40 MG tablet Take 1 tablet (40 mg total) by mouth daily at 6 PM. 07/28/20   Billie Ruddy, MD  carvedilol (COREG) 12.5 MG tablet Take 1 tablet (12.5 mg total) by mouth 2 (two) times daily with a meal. 07/28/20   Billie Ruddy, MD  Ferrous Sulfate (IRON) 325 (65 Fe) MG TABS Take 1 tablet by mouth every morning. 11/05/19   [provider]  fluticasone (FLONASE) 50 MCG/ACT nasal spray Place 2 sprays into both nostrils 2 (two) times a day. Patient taking differently: Place 2 sprays into both nostrils daily.  07/05/19   Valentina Shaggy, MD  Fluticasone-Salmeterol (ADVAIR DISKUS) 250-50 MCG/DOSE AEPB Inhale 1 puff into the lungs 2 (two) times daily. 04/14/20   Valentina Shaggy, MD  haloperidol (HALDOL) 5 MG tablet Take 0.5 tablets (2.5 mg total) by mouth at bedtime. Take 1/2 tab at bed time 11/11/20   Arfeen, Arlyce Harman, MD  hydrALAZINE (APRESOLINE) 25 MG tablet Take 0.5 tablets (12.5 mg total) by mouth in the morning and at bedtime. 07/28/20   Billie Ruddy, MD  levothyroxine (SYNTHROID) 50 MCG tablet Take 1 tablet (50 mcg total) by mouth daily before breakfast. 09/16/20   Billie Ruddy, MD  lisinopril  (ZESTRIL) 40 MG tablet Take 1 tablet (40 mg total) by mouth daily. 01/23/20   Billie Ruddy, MD  methocarbamol (ROBAXIN) 500 MG tablet Take 1 tablet (500 mg total) by mouth 2 (two) times daily. 11/18/20   Shaelin Lalley, PA-C    Allergies    Bee pollen, Pollen extract, and Latex  Review of Systems   Review of Systems  Constitutional: Negative for appetite change, chills and fever.  HENT: Negative for ear pain, rhinorrhea, sneezing and sore throat.   Eyes: Negative for photophobia and visual disturbance.  Respiratory: Positive for shortness of breath. Negative for cough, chest tightness and wheezing.   Cardiovascular: Positive for chest pain. Negative for palpitations.  Gastrointestinal: Negative for abdominal pain, blood in stool, constipation, diarrhea, nausea and vomiting.  Genitourinary: Negative for dysuria, hematuria and urgency.  Musculoskeletal: Positive for back pain and myalgias.  Skin: Negative for rash.  Neurological: Positive for headaches. Negative for dizziness, weakness and light-headedness.    Physical Exam Updated Vital Signs BP (!) 139/92   Pulse 81   Temp 98.1 F (36.7 C) (Oral)   Resp 13   Ht 5\' 2"  (1.575 m)   Wt 117.9 kg   LMP 02/02/2017 (Approximate)   SpO2 99%   BMI 47.55 kg/m   Physical Exam Vitals and nursing note reviewed.  Constitutional:      General: She is not in acute distress.    Appearance: She is well-developed and well-nourished. She is obese.     Comments: Speaking complete sentences without difficulty. No signs of respiratory distress  HENT:     Head: Normocephalic and atraumatic.     Nose: Nose normal.  Eyes:     General: No scleral icterus.       Right eye: No discharge.        Left eye: No discharge.     Extraocular Movements: EOM normal.     Conjunctiva/sclera: Conjunctivae normal.     Pupils: Pupils are equal, round, and reactive to light.  Cardiovascular:     Rate and Rhythm: Normal rate and regular rhythm.     Pulses:  Intact distal pulses.     Heart sounds: Normal heart sounds. No murmur heard. No friction rub. No gallop.   Pulmonary:     Effort: Pulmonary effort is normal. No respiratory distress.     Breath sounds: Normal breath sounds.  Abdominal:     General: Bowel sounds are normal. There is no distension.     Palpations: Abdomen is soft.     Tenderness: There is no abdominal tenderness. There is no guarding.  Musculoskeletal:        General: No edema. Normal range of motion.     Cervical back: Normal range of motion and neck supple.     Right lower leg: No edema.     Left lower leg: No edema.     Comments: No lower extremity edema, erythema or calf tenderness bilaterally. No tenderness in the right lateral thigh, no overlying skin changes.  Skin:    General: Skin is warm and dry.     Findings: No rash.  Neurological:     Mental Status: She is alert and oriented to person, place, and time.     Cranial Nerves: No cranial nerve deficit.     Sensory: No sensory deficit.     Motor: No weakness or abnormal muscle tone.     Coordination: Coordination normal.     Comments: Pupils reactive. No facial asymmetry noted. Cranial nerves appear grossly intact. Sensation intact to light touch on face, BUE and BLE. Strength 5/5 in BUE and BLE.   Psychiatric:        Mood and Affect: Mood and affect normal.     ED Results / Procedures / Treatments   Labs (all labs ordered are listed, but only abnormal results are displayed) Labs Reviewed  BASIC METABOLIC PANEL - Abnormal; Notable for the following components:      Result Value   Glucose, Bld 120 (*)    BUN 24 (*)  Creatinine, Ser 1.39 (*)    GFR, Estimated 44 (*)    All other components within normal limits  CBC WITH DIFFERENTIAL/PLATELET - Abnormal; Notable for the following components:   RBC 3.81 (*)    Hemoglobin 10.4 (*)    HCT 33.9 (*)    All other components within normal limits  TROPONIN I (HIGH SENSITIVITY)  TROPONIN I (HIGH  SENSITIVITY)    EKG EKG Interpretation  Date/Time:  Tuesday November 18 2020 09:10:52 EST Ventricular Rate:  69 PR Interval:    QRS Duration: 92 QT Interval:  379 QTC Calculation: 406 R Axis:   -16 Text Interpretation: Sinus rhythm Borderline left axis deviation Low voltage, precordial leads Confirmed by Dewaine Conger [phone]) on 11/18/2020 9:55:59 AM   Radiology DG Chest 2 View  Result Date: 11/18/2020 CLINICAL DATA:  Chest pain. EXAM: CHEST - 2 VIEW COMPARISON:  08/28/2019 FINDINGS: The heart size and mediastinal contours are within normal limits. Both lungs are clear. Spondylosis identified within the thoracic spine. IMPRESSION: No active cardiopulmonary abnormalities Electronically Signed   By: Kerby Moors M.D.   On: 11/18/2020 10:13   CT Head Wo Contrast  Result Date: 11/18/2020 CLINICAL DATA:  Headache. EXAM: CT HEAD WITHOUT CONTRAST TECHNIQUE: Contiguous axial images were obtained from the base of the skull through the vertex without intravenous contrast. COMPARISON:  MRI head 02/17/2019 FINDINGS: Brain: No evidence of acute infarction, hemorrhage, hydrocephalus, extra-axial collection or mass lesion/mass effect. Vascular: Negative for hyperdense vessel Skull: Negative Sinuses/Orbits: Prior sinus surgery. Mucosal edema in the paranasal sinuses without air-fluid level. Negative orbit Other: None IMPRESSION: Negative CT of the brain Chronic sinusitis with prior sinus surgery. Electronically Signed   By: Franchot Gallo M.D.   On: 11/18/2020 10:15   VAS [place] LOWER EXTREMITY VENOUS (DVT) (ONLY [facility] & [facility] 7a-7p)  Result Date: 11/18/2020  Lower Venous DVT Study Indications: Edema.  Risk Factors: None identified. Limitations: Body habitus and poor ultrasound/tissue interface. Comparison Study: No prior studies. Performing Technologist: Oliver Hum RVT  Examination Guidelines: A complete evaluation includes B-mode imaging, spectral Doppler, color Doppler, and power Doppler as needed of all  accessible portions of each vessel. Bilateral testing is considered an integral part of a complete examination. Limited examinations for reoccurring indications may be performed as noted. The reflux portion of the exam is performed with the patient in reverse Trendelenburg.  +---------+---------------+---------+-----------+----------+--------------+ RIGHT    CompressibilityPhasicitySpontaneityPropertiesThrombus Aging +---------+---------------+---------+-----------+----------+--------------+ CFV      Full           Yes      Yes                                 +---------+---------------+---------+-----------+----------+--------------+ SFJ      Full                                                        +---------+---------------+---------+-----------+----------+--------------+ FV Prox  Full                                                        +---------+---------------+---------+-----------+----------+--------------+ FV Mid   Full                                                        +---------+---------------+---------+-----------+----------+--------------+  FV DistalFull                                                        +---------+---------------+---------+-----------+----------+--------------+ PFV      Full                                                        +---------+---------------+---------+-----------+----------+--------------+ POP      Full           Yes      Yes                                 +---------+---------------+---------+-----------+----------+--------------+ PTV      Full                                                        +---------+---------------+---------+-----------+----------+--------------+ PERO     Full                                                        +---------+---------------+---------+-----------+----------+--------------+   +----+---------------+---------+-----------+----------+--------------+  LEFTCompressibilityPhasicitySpontaneityPropertiesThrombus Aging +----+---------------+---------+-----------+----------+--------------+ CFV Full           Yes      Yes                                 +----+---------------+---------+-----------+----------+--------------+     Summary: RIGHT: - There is no evidence of deep vein thrombosis in the lower extremity.  - No cystic structure found in the popliteal fossa.  LEFT: - No evidence of common femoral vein obstruction.  *See table(s) above for measurements and observations.    Preliminary     Procedures Procedures (including critical care time)  Medications Ordered in ED Medications  methocarbamol (ROBAXIN) tablet 500 mg (500 mg Oral Given 11/18/20 1132)  acetaminophen (TYLENOL) tablet 650 mg (650 mg Oral Given 11/18/20 1131)    ED Course  I have reviewed the triage vital signs and the nursing notes.  Pertinent labs & imaging results that were available during my care of the patient were reviewed by me and considered in my medical decision making (see chart for details).  Clinical Course as of 11/18/20 1324  Tue Nov 18, 2020  1036 Troponin I (High Sensitivity): 11 [HK]  1104 Troponin I (High Sensitivity): 10 [HK]    Clinical Course User Index [HK] Delia Heady, PA-C   MDM Rules/Calculators/A&P                          59 year old female with past medical history of diabetes, GERD, hypertension and schizophrenia presenting to the ED with multiple complaints. 1.  Headache reports left-sided intermittent headache with minimal improvement with Tylenol and  oxycodone.  States this feels different than her usual headaches.  Denies any vision changes, vomiting, numbness in arms or legs.  On exam patient without any neurological deficits.  No meningeal signs.  No weakness or numbness noted on exam.  She is afebrile.  CT of the head without any acute findings, this was done due to new headache over the age of 40.  No structural cause  seen on CT.  Denies any pain during time evaluation There are no headache characteristics that are lateralizing or concerning for increased ICP, infectious or vascular cause of her symptoms. 2.  Right upper thigh pain.  No injury or trauma.  Pain is worse with movement.  No tenderness noted on exam, no external abnormalities.  DVT study is negative.  Suspect musculoskeletal cause. Low suspicion for infectious etiology. #3 reports left-sided back pain that is now radiating to the chest.  The back pain has been chronic and was told it was due to a pinched nerve.  Intermittent chest pain for the past 4 days without specific aggravating or alleviating factor.  No cough, hemoptysis.  EKG shows sinus rhythm, no changes from prior tracings.  Chest x-ray is unremarkable.  CBC, BMP, initial delta troponin all unremarkable.  Creatinine is at baseline. Doubt ACS, PE or other emergent cause based on her work-up today as well as her risk factors.  Her pain improved with muscle relaxer and Tylenol here.  Due to her negative DVT study I doubt this is due to a PE.  Will treat symptomatically.  She declined Covid testing.  Return precautions given.   Patient is hemodynamically stable, in NAD, and able to ambulate in the ED. Evaluation does not show pathology that would require ongoing emergent intervention or inpatient treatment. I explained the diagnosis to the patient. Pain has been managed and has no complaints prior to discharge. Patient is comfortable with above plan and is stable for discharge at this time. All questions were answered prior to disposition. Strict return precautions for returning to the ED were discussed. Encouraged follow up with PCP.   An After Visit Summary was printed and given to the patient.   Portions of this note were generated with [employer]. Dictation errors may occur despite best attempts at proofreading.  Final Clinical Impression(s) / ED Diagnoses Final diagnoses:   Acute non intractable tension-type headache  Chest wall pain  Muscle strain    Rx / DC Orders ED Discharge Orders         Ordered    methocarbamol (ROBAXIN) 500 MG tablet  2 times daily        11/18/20 1317           Delia Heady, PA-C 11/18/20 1324    Breck Coons, MD 11/18/20 1350

## 2020-11-18 NOTE — ED Notes (Signed)
Pt in bed resting. Respirations even and unlabored. Pt complains of pain, MD made aware. Will continue to monitor.

## 2020-11-18 NOTE — Discharge Instructions (Addendum)
Take the muscle relaxer as needed with Tylenol to help with pain. Your work-up today including imaging and lab work did not show any concerning findings that needed urgent treatment. Follow-up with your primary care provider. Return to the ER if you start to experience worsening pain, headache, injuries or falls, severe chest pain or shortness of breath.

## 2020-11-18 NOTE — ED Triage Notes (Signed)
Pt reports left arm, upper back and right thigh pain. Denies any injury. Pt ambulatory to triage. Sx began Friday.

## 2020-11-24 ENCOUNTER — Encounter: Payer: Self-pay | Admitting: Family Medicine

## 2020-11-25 DIAGNOSIS — Z1231 Encounter for screening mammogram for malignant neoplasm of breast: Secondary | ICD-10-CM

## 2020-12-08 ENCOUNTER — Other Ambulatory Visit: Payer: Self-pay | Admitting: Family Medicine

## 2020-12-08 DIAGNOSIS — E039 Hypothyroidism, unspecified: Secondary | ICD-10-CM

## 2020-12-08 NOTE — Telephone Encounter (Signed)
Pt has a CPE scheduled for 12/10/2020 with Dr Salomon Fick, refill will be sent then

## 2020-12-10 ENCOUNTER — Encounter: Payer: Self-pay | Admitting: Family Medicine

## 2020-12-10 ENCOUNTER — Other Ambulatory Visit: Payer: Self-pay

## 2020-12-10 ENCOUNTER — Telehealth: Payer: Medicare HMO | Admitting: Family Medicine

## 2020-12-10 NOTE — Progress Notes (Signed)
Attempting to connect with pt via mychart video, however her video and audio do not seem to work.  Will try again.  Still unable to connect with pt.

## 2020-12-15 ENCOUNTER — Other Ambulatory Visit: Payer: Self-pay | Admitting: Family Medicine

## 2020-12-15 DIAGNOSIS — J302 Other seasonal allergic rhinitis: Secondary | ICD-10-CM

## 2020-12-15 DIAGNOSIS — M542 Cervicalgia: Secondary | ICD-10-CM | POA: Diagnosis not present

## 2020-12-15 DIAGNOSIS — M5412 Radiculopathy, cervical region: Secondary | ICD-10-CM | POA: Diagnosis not present

## 2020-12-17 ENCOUNTER — Encounter: Payer: Self-pay | Admitting: Family Medicine

## 2020-12-17 ENCOUNTER — Other Ambulatory Visit: Payer: Self-pay

## 2020-12-17 ENCOUNTER — Ambulatory Visit (INDEPENDENT_AMBULATORY_CARE_PROVIDER_SITE_OTHER): Payer: Medicare HMO | Admitting: Family Medicine

## 2020-12-17 VITALS — BP 112/76 | HR 80 | Temp 98.5°F | Wt 259.0 lb

## 2020-12-17 DIAGNOSIS — R252 Cramp and spasm: Secondary | ICD-10-CM | POA: Diagnosis not present

## 2020-12-17 NOTE — Progress Notes (Signed)
Subjective:    Patient ID: Maria Franklin, female    DOB: 10-29-1961, 60 y.o.   MRN: [id number]  No chief complaint on file.   HPI Patient is a 60 yo female with pmh sig for anemia, asthma, GERD, DM II, HLD, HTN, hyperthyroidism, schizophrenia, OSA who was seen today for ongoing concern.  Patient endorses spasms in hands, abdomen, feet, and legs x 2 months.  Pt inquires if she should be taking potassium supplements as she was on them in the past.  Pt eating more bananas.  Drinking 20-32 oz of water per day as she does not like water.  Taking Lipitor 40 mg daily.  Also taking iron daily, denies constipation.  Past Medical History:  Diagnosis Date  . Allergy   . Anemia   . Angio-edema   . Asthma    ALL THE TIME   LAST FLARE UP 12/2018  . Chest pain 08/2019  . Chicken pox   . Diabetes mellitus without complication ([place])   . GERD (gastroesophageal reflux disease)   . Hyperlipidemia   . Hypertension   . Hyperthyroidism   . Recurrent upper respiratory infection (URI)   . Schizophrenia ([place])   . Sleep apnea    DX 2-3 YRS AGO.  Marland Kitchen Thyroid disease   . Urticaria     Allergies  Allergen Reactions  . Bee Pollen Cough  . Pollen Extract Cough  . Latex Itching, Rash and Other (See Comments)    Severe itching    ROS General: Denies fever, chills, night sweats, changes in weight, changes in appetite HEENT: Denies headaches, ear pain, changes in vision, rhinorrhea, sore throat CV: Denies CP, palpitations, SOB, orthopnea Pulm: Denies SOB, cough, wheezing GI: Denies abdominal pain, nausea, vomiting, diarrhea, constipation GU: Denies dysuria, hematuria, frequency, vaginal discharge Msk: Denies muscle cramps, joint pains +muscle cramps Neuro: Denies weakness, numbness, tingling Skin: Denies rashes, bruising Psych: Denies depression, anxiety, hallucinations     Objective:    Blood pressure 112/76, pulse 80, temperature 98.5 F (36.9 C), temperature source Oral, weight 259 lb (117.5 kg),  last menstrual period 02/02/2017, SpO2 98 %.  Gen. Pleasant, well-nourished, in no distress, normal affect   HEENT: [place]/AT, face symmetric, conjunctiva clear, no scleral icterus, PERRLA, EOMI, nares patent without drainage Lungs: no accessory muscle use Cardiovascular: RRR, no peripheral edema Musculoskeletal: No deformities, no cyanosis or clubbing, normal tone Neuro:  A&Ox3, CN II-XII intact, normal gait Skin:  Warm, no lesions/ rash   Wt Readings from Last 3 Encounters:  12/17/20 259 lb (117.5 kg)  11/18/20 260 lb (117.9 kg)  08/07/20 262 lb (118.8 kg)    Lab Results  Component Value Date   WBC 7.0 11/18/2020   HGB 10.4 (L) 11/18/2020   HCT 33.9 (L) 11/18/2020   PLT 266 11/18/2020   GLUCOSE 120 (H) 11/18/2020   CHOL 156 07/28/2020   TRIG 83 07/28/2020   HDL 70 07/28/2020   LDLDIRECT 97.1 01/14/2014   LDLCALC 69 07/28/2020   ALT 19 07/28/2020   AST 20 07/28/2020   NA 136 11/18/2020   K 4.6 11/18/2020   CL 102 11/18/2020   CREATININE 1.39 (H) 11/18/2020   BUN 24 (H) 11/18/2020   CO2 28 11/18/2020   TSH 3.27 07/28/2020   HGBA1C 6.3 (H) 07/28/2020   MICROALBUR 4.2 (H) 07/25/2019    Assessment/Plan:  Muscle cramps  -discussed possible causes.   -advised potassium supplement not indicated at this time as no longer taking HCTZ and K+ normal during  last check (K+4.4 on 11/18/20) -will hold lipitor 40 mg x 2 wks to see if pt notices improvement in symptoms. Per chart review: Lipitor increased from 10 mg to 40 mg 08/30/19 by Cardiology as pt was having CP. -for improvement in symptoms with holding lipitor consider switching statins vs QOD dosing. -given precautions -continue to monitor  F/u prn in the next 2-4 wks  Grier Mitts, MD

## 2020-12-19 ENCOUNTER — Other Ambulatory Visit: Payer: Self-pay | Admitting: Internal Medicine

## 2020-12-19 ENCOUNTER — Other Ambulatory Visit: Payer: Self-pay | Admitting: Family Medicine

## 2020-12-19 DIAGNOSIS — Z1231 Encounter for screening mammogram for malignant neoplasm of breast: Secondary | ICD-10-CM

## 2020-12-23 DIAGNOSIS — Z1231 Encounter for screening mammogram for malignant neoplasm of breast: Secondary | ICD-10-CM

## 2021-01-06 LAB — HM MAMMOGRAPHY

## 2021-01-07 ENCOUNTER — Ambulatory Visit
Admission: RE | Admit: 2021-01-07 | Discharge: 2021-01-07 | Disposition: A | Discharge status: Home / Self Care | Payer: Medicare HMO | Source: Ambulatory Visit

## 2021-01-07 ENCOUNTER — Other Ambulatory Visit: Payer: Self-pay

## 2021-01-07 DIAGNOSIS — Z1231 Encounter for screening mammogram for malignant neoplasm of breast: Secondary | ICD-10-CM

## 2021-01-08 ENCOUNTER — Other Ambulatory Visit: Payer: Self-pay | Admitting: Family Medicine

## 2021-01-08 DIAGNOSIS — E039 Hypothyroidism, unspecified: Secondary | ICD-10-CM

## 2021-01-09 ENCOUNTER — Encounter: Payer: Self-pay | Admitting: Family Medicine

## 2021-01-13 DIAGNOSIS — Z20822 Contact with and (suspected) exposure to covid-19: Secondary | ICD-10-CM | POA: Diagnosis not present

## 2021-01-23 ENCOUNTER — Encounter: Payer: Self-pay | Admitting: Family Medicine

## 2021-01-23 ENCOUNTER — Other Ambulatory Visit: Payer: Self-pay

## 2021-01-23 ENCOUNTER — Ambulatory Visit (INDEPENDENT_AMBULATORY_CARE_PROVIDER_SITE_OTHER): Payer: Medicare HMO | Admitting: Family Medicine

## 2021-01-23 VITALS — BP 119/80 | HR 94 | Temp 98.6°F | Ht 62.0 in | Wt 261.4 lb

## 2021-01-23 DIAGNOSIS — Z713 Dietary counseling and surveillance: Secondary | ICD-10-CM | POA: Diagnosis not present

## 2021-01-23 DIAGNOSIS — Z6841 Body Mass Index (BMI) 40.0 and over, adult: Secondary | ICD-10-CM | POA: Diagnosis not present

## 2021-01-23 DIAGNOSIS — G4733 Obstructive sleep apnea (adult) (pediatric): Secondary | ICD-10-CM

## 2021-01-23 DIAGNOSIS — E119 Type 2 diabetes mellitus without complications: Secondary | ICD-10-CM

## 2021-01-23 NOTE — Progress Notes (Signed)
Subjective:    Patient ID: Maria Franklin, female    DOB: Dec 22, 1960, 60 y.o.   MRN: [id number]  No chief complaint on file.   HPI Patient was seen today for weight loss advice.  Pt interested in bariatric surgery options specifically lap band.  Pt notes struggling with weight x 10 yrs or more.  Unable to exercise as much 2/2 pain in knees and back.  Pt has tried diet modifications, joined the "Y", Noom, golo, ACV, and Alli.  At home walking on treadmill, using the elliptical, and weights.  May lose 10-12 pounds but gained it back.  Using CPAP nightly.  Past Medical History:  Diagnosis Date  . Allergy   . Anemia   . Angio-edema   . Asthma    ALL THE TIME   LAST FLARE UP 12/2018  . Chest pain 08/2019  . Chicken pox   . Diabetes mellitus without complication ([place])   . GERD (gastroesophageal reflux disease)   . Hyperlipidemia   . Hypertension   . Hyperthyroidism   . Recurrent upper respiratory infection (URI)   . Schizophrenia ([place])   . Sleep apnea    DX 2-3 YRS AGO.  Marland Kitchen Thyroid disease   . Urticaria     Allergies  Allergen Reactions  . Bee Pollen Cough  . Pollen Extract Cough  . Latex Itching, Rash and Other (See Comments)    Severe itching    ROS General: Denies fever, chills, night sweats, changes in appetite  +weight gain HEENT: Denies headaches, ear pain, changes in vision, rhinorrhea, sore throat CV: Denies CP, palpitations, SOB, orthopnea Pulm: Denies SOB, cough, wheezing GI: Denies abdominal pain, nausea, vomiting, diarrhea, constipation GU: Denies dysuria, hematuria, frequency, vaginal discharge Msk: Denies muscle cramps, joint pains Neuro: Denies weakness, numbness, tingling Skin: Denies rashes, bruising Psych: Denies depression, anxiety, hallucinations     Objective:    Blood pressure 119/80, pulse 94, temperature 98.6 F (37 C), temperature source Oral, height 5\' 2"  (1.575 m), weight 261 lb 6.4 oz (118.6 kg), last menstrual period 02/02/2017, SpO2 99  %.  Gen. Pleasant, well-nourished, obese, in no distress, normal affect   HEENT: [place]/AT, face symmetric, conjunctiva clear, no scleral icterus, PERRLA, EOMI, nares patent without drainage Lungs: no accessory muscle use Cardiovascular: RRR, no peripheral edema Musculoskeletal: No deformities, no cyanosis or clubbing, normal tone Neuro:  A&Ox3, CN II-XII intact, normal gait Skin:  Warm, no lesions/ rash   Wt Readings from Last 3 Encounters:  12/17/20 259 lb (117.5 kg)  11/18/20 260 lb (117.9 kg)  08/07/20 262 lb (118.8 kg)    Lab Results  Component Value Date   WBC 7.0 11/18/2020   HGB 10.4 (L) 11/18/2020   HCT 33.9 (L) 11/18/2020   PLT 266 11/18/2020   GLUCOSE 120 (H) 11/18/2020   CHOL 156 07/28/2020   TRIG 83 07/28/2020   HDL 70 07/28/2020   LDLDIRECT 97.1 01/14/2014   LDLCALC 69 07/28/2020   ALT 19 07/28/2020   AST 20 07/28/2020   NA 136 11/18/2020   K 4.6 11/18/2020   CL 102 11/18/2020   CREATININE 1.39 (H) 11/18/2020   BUN 24 (H) 11/18/2020   CO2 28 11/18/2020   TSH 3.27 07/28/2020   HGBA1C 6.3 (H) 07/28/2020   MICROALBUR 4.2 (H) 07/25/2019    Assessment/Plan:  Class 3 severe obesity due to excess calories with serious comorbidity and body mass index (BMI) of 45.0 to 49.9 in adult [facility])  -medications likely contributing. -Reviewed medication and  surgical options for weight loss. -Pt advised to inquire with her insurance company about coverage -continue lifestyle modifications -referral to gen surg placed for consultation - Plan: Ambulatory referral to General Surgery  Weight loss counseling, encounter for  OSA (obstructive sleep apnea) -continue CPAP use  Type 2 diabetes mellitus without complication, without long-term current use of insulin (HCC) -hgb A1C 6.3% on 07/28/20 -continue lifestyle modifications  F/u prn  Grier Mitts, MD

## 2021-01-23 NOTE — Patient Instructions (Addendum)
Bariatric Surgery Information Bariatric surgery, also called weight loss surgery, is a procedure that helps you lose weight. You may consider, or your health care provider may suggest, bariatric surgery if:  You are severely obese and have been unable to lose weight through diet and exercise.  You have health problems related to obesity, such as: ? Type 2 diabetes. ? Heart disease. ? Lung disease. How does bariatric surgery help me lose weight? Bariatric surgery helps you lose weight by:  Decreasing how much food your body absorbs. This is done by closing off part of your stomach to make it smaller. This restricts the amount of food your stomach can hold.  Changing your body's regular digestive process so that food bypasses the parts of your body that absorb calories and nutrients. If you decide to have bariatric surgery, it is important to continue to eat a healthy diet and exercise regularly after the surgery. What are the different kinds of bariatric surgery? There are two kinds of bariatric surgeries:  Restrictive surgery. This procedure makes your stomach smaller. It does not change your digestive process. The smaller the size of your new stomach, the less food you can eat. There are different types of restrictive surgeries.  Malabsorptive surgery. This procedure makes your stomach smaller and alters your digestive process so that your body processes less calories and nutrients. These are the most common kind of bariatric surgery. There are different types of malabsorptive surgeries. What are the different types of restrictive surgery? Adjustable Gastric Banding In this procedure, an inflatable band is placed around your stomach near the upper end. This makes the passageway for food into the rest of your stomach much smaller. The band can be adjusted, making it tighter or looser, by filling it with salt solution. Your surgeon can adjust the band based on how you are feeling and how much  weight you are losing. The band can be removed in the future. This requires another surgery. Sleeve Gastrectomy In this procedure, your stomach is made smaller. This is done by surgically removing a large part of your stomach. When your stomach is smaller, you feel full more quickly and reduce how much you eat. What are the different types of malabsorptive surgery? Roux-en-Y Gastric Bypass (RGB) This is the most common weight loss surgery. In this procedure, a small stomach pouch (gastric pouch) is created in the upper part of your stomach. Next, this gastric pouch is attached directly to the middle part of your small intestine. The farther down your small intestine the new connection is made, the fewer calories and nutrients you will absorb. This surgery has the highest rate of complications. Biliopancreatic Diversion with Duodenal Switch (BPD/DS) This is a multi-step procedure. First, a large part of your stomach is removed, making your stomach smaller. Next, this smaller stomach is attached to the lower part of your small intestine. Like the RGB surgery, you absorb fewer calories and nutrients the farther down your small intestine the attachment is made.   What are the risks of bariatric surgery? As with any surgical procedure, each type of bariatric surgery has its own risks. These risks also depend on your age, your overall health, and any other medical conditions you may have. When deciding on bariatric surgery, it is very important to:  Talk to your health care provider and choose the surgery that is best for you.  Ask your health care provider about specific risks for the surgery you choose. Generally, the risks of bariatric surgery   include:  Infection.  Bleeding.  Not getting enough nutrients from food (nutritional deficiencies).  Failure of the device or procedure. This may require another surgery to correct the problem. Where to find more information  American Society for  Metabolic & Bariatric Surgery: www.asmbs.org  Weight-control Information Network (WIN): win.AmenCredit.is Summary  Bariatric surgery, also called weight loss surgery, is a procedure that helps you lose weight.  This surgery may be recommended if you have diabetes, heart disease, or lung disease.  Generally, risks of bariatric surgery include infection, bleeding, and failure of the surgery or device, which may require another surgery to correct the problem. This information is not intended to replace advice given to you by your health care provider. Make sure you discuss any questions you have with your health care provider. Document Revised: 03/27/2020 Document Reviewed: 03/27/2020 Elsevier Patient Education  2021 [place].  Laparoscopic Gastric Band Surgery Laparoscopic gastric band surgery is a procedure to help you lose weight (bariatric surgery). During surgery, a band (gastric band) is put around the upper part of your stomach. This makes a small pouch that can hold only a small amount of food. The lower, bigger part of your stomach is below the band. The two parts stay connected by a small opening. Food passes into the lower part more slowly than before surgery. This means that you need less food to make you feel full. What happens before the procedure? Medicines  Ask your doctor about: ? Changing or stopping your normal medicines. This is important if you take diabetes medicines or blood thinners. ? Taking medicines such as aspirin and ibuprofen. These medicines can thin your blood. Do not take these medicines unless your doctor tells you to take them. ? Taking over-the-counter medicines, vitamins, herbs, and supplements.  You may be given antibiotic medicine. Staying hydrated Follow instructions from your doctor about drinking fluids. This may include:  Up to 2 hours before surgery - you may keep drinking clear liquids. These include: ? Water. ? Clear fruit juice. ? Black  coffee. ? Plain tea. Eating and drinking restrictions Follow instructions from your doctor about eating and drinking. These may include:  8 hours before surgery - stop eating heavy meals or foods. These include meat, fried foods, and fatty foods.  6 hours before surgery - stop eating light meals or foods. These include toast and cereal.  6 hours before surgery - stop drinking milk or drinks that have milk in them.  2 hours before surgery - stop drinking clear liquids. General instructions  Do not use any products that have nicotine or tobacco in them. These include cigarettes and e-cigarettes. If you need help quitting, ask your doctor.  You may be asked to shower with a germ-killing soap.  You may have blood tests.  You may have a test that makes a picture (image) of your gallbladder. This is an ultrasound.  Do not gain weight. This is important. Manage your weight as told by your doctor.  Plan to have someone take you home.  Plan to have someone you trust take care of you for at least 24 hours after you leave the hospital or clinic. This is important. What happens during the procedure?  An IV tube will be placed into one of your veins.  You will be given a medicine to make you fall asleep (general anesthetic).  You may be given a medicine to help you relax (sedative).  A few small cuts (incisions) will be made in your  belly (abdomen).  A tube (laparoscope) will be put through one of the cuts. The tube has a camera on it to help your surgeon see the area.  Surgical tools will be put through the other cuts.  A band will be put around the upper part of your stomach.  A tube (port) will be put under the skin of your belly. It will be connected to your stomach band. The tube helps your doctor change the size of the band after surgery.  Your cuts will be: ? Closed with stitches (sutures) or staples. ? Covered with a bandage (dressing). The procedure may vary among doctors  and hospitals.   What happens after the procedure?  You will be monitored until the medicines you were given have worn off. This includes checking your blood pressure, heart rate, breathing rate, and blood oxygen level.  You may be able to feel the port in your belly. This is normal.  You will need to take a test (barium swallow). You will swallow a liquid that shows up on X-rays. X-rays will be taken while you are in certain positions. These X-rays will show if the band is in the right place.  Do not drive until your doctor says that this is safe for you. If you were given a medicine to help you relax, do not drive for 24 hours.  You will have follow-up visits to change the size of the band. Summary  Laparoscopic gastric band surgery is a procedure to help you lose weight.  During surgery, a band (gastric band) is put around the upper part of your stomach. This means that you need less food to make you feel full.  Do not gain weight before surgery. This is important.  You will have follow-up visits to change the size of the band. This information is not intended to replace advice given to you by your health care provider. Make sure you discuss any questions you have with your health care provider. Document Revised: 03/27/2020 Document Reviewed: 03/27/2020 Elsevier Patient Education  2021 [place].  Sleeve Gastrectomy A sleeve gastrectomy is a surgery in which a large portion of the stomach (about 80%) is removed. After the surgery, the size of the stomach is reduced to a narrow tube about the size of a banana. The reduced size of the stomach limits the amount of food that you can eat, which helps you lose weight. Tell a health care provider about:  Any allergies you have.  All medicines you are taking, including vitamins, herbs, eye drops, creams, and over-the-counter medicines.  Any problems you or family members have had with anesthetic medicines.  Any blood disorders you  have.  Any surgeries you have had.  Any medical conditions you have.  Whether you are pregnant or may be pregnant. What are the risks? Generally, this is a safe procedure. However, problems may occur, including:  Infection.  Bleeding.  Allergic reactions to medicines.  Damage to other structures or organs.  Blood clots. What happens before the procedure? Staying hydrated Follow instructions from your health care provider about hydration, which may include:  Up to 2 hours before the procedure - you may continue to drink clear liquids, such as water, clear fruit juice, black coffee, and plain tea.   Eating and drinking restrictions Follow instructions from your health care provider about eating and drinking, which may include:  8 hours before the procedure - stop eating heavy meals or foods, such as meat, fried foods, or  fatty foods.  6 hours before the procedure - stop eating light meals or foods, such as toast or cereal.  6 hours before the procedure - stop drinking milk or drinks that contain milk.  2 hours before the procedure - stop drinking clear liquids. Medicines Ask your health care provider about:  Changing or stopping your regular medicines. This is especially important if you are taking diabetes medicines or blood thinners.  Taking medicines such as aspirin and ibuprofen. These medicines can thin your blood. Do not take these medicines unless your health care provider tells you to take them.  Taking over-the-counter medicines, vitamins, herbs, and supplements. Tests You may have an exam or testing. This may include:  Blood tests.  Urine tests.  Stool tests.  X-rays.  Ultrasound.  Esophageal manometry. This test checks the tube that carries food from your mouth to your stomach (esophagus). General instructions  Plan to have someone take you home from the hospital or clinic.  If you will be going home right after the procedure, plan to have someone  with you for 24 hours.  Ask your health care provider how your surgical site will be marked or identified.  Ask your health care provider what steps will be taken to help prevent infection. These may include: ? Removing hair at the surgery site. ? Washing skin with a germ-killing soap. ? Taking antibiotic medicine. What happens during the procedure?  An IV will be inserted into one of your veins.  You may be given one or both of the following: ? A medicine to help you relax (sedative). ? A medicine to make you fall asleep (general anesthetic).  Several small incisions will be made in your abdomen.  A thin, lighted tube with a tiny camera (laparoscope) will be inserted into one of the incisions. The camera sends pictures to a TV screen in the operating room. This gives the surgeon a good view of the stomach.  Small surgical instruments will be put through the other incisions.  Part of your stomach will be cut and removed through one of the incisions.  The remaining part of your stomach will be closed using stitches (sutures).  A small tube (drain) may be placed through one of the incisions to allow extra fluid to flow from the area.  Your incisions may be closed with sutures, staples, or skin glue.  Your incisions may be covered with a bandage (dressing). The procedure may vary among health care providers and hospitals.   What happens after the procedure?  Your blood pressure, heart rate, breathing rate, and blood oxygen level will be monitored until you leave the hospital or clinic.  You will continue to receive fluids and medicines through an IV line.  You may have some pain and nausea. You will be given medicines to treat this.  You may have fluid leaking from a drain in one of your incisions. This is normal.  You may have to wear compression stockings. These stockings help to prevent blood clots and reduce swelling in your legs.  You will be encouraged to walk around  several times a day. This helps to prevent blood clots.  You will be started on a liquid diet the first day after your procedure. You may have some tests to check if you are ready to start the liquid diet.  You will be encouraged to cough and to perform deep-breathing exercises. This helps to prevent a lung infection.  Do not drive for 24 hours if  you were given a sedative during your procedure. Summary  A sleeve gastrectomy is a surgery in which a large portion of the stomach (about 80%) is removed.  The reduced size of the stomach limits the amount of food that you can eat, which helps you lose weight.  Follow your health care provider's instructions on when to stop eating and drinking and what medicines to change or stop before the procedure.  Your health care provider will make several small incisions in your abdomen. He or she will cut part of your stomach and remove it through one of the small incisions.  After the procedure, you will be given medicines to treat pain and nausea. Do not drive for 24 hours if you were given a sedative during your procedure. This information is not intended to replace advice given to you by your health care provider. Make sure you discuss any questions you have with your health care provider. Document Revised: 03/15/2019 Document Reviewed: 07/11/2018 Elsevier Patient Education  2021 [place].  Duodenal Switch Procedure The duodenal switch procedure is a surgery to help you lose weight (bariatric surgery). The duodenal switch procedure is also called a biliopancreatic diversion with duodenal switch. This procedure combines two types of bariatric surgery.  In the first part, a large portion of your stomach is removed, making your stomach smaller. As a result, you will eat less.  Next, the smaller stomach is attached to the lower part of your small intestine. This will cause food to bypass much of your small intestine so that you absorb less of the  food you eat. You may have this procedure if you are very overweight (morbidly obese) and have not been able to lose weight with diet and exercise. This procedure can help you lose weight and reduce your risk for heart disease, high blood pressure, and type 2 diabetes. After the procedure, you will not be able to eat as much food or absorb as many nutrients. You will need to follow a certain diet and take vitamins, minerals, and proteins. Tell your health care provider about:  Any allergies you have.  All medicines you are taking, including vitamins, herbs, eye drops, creams, and over-the-counter medicines.  Any problems you or family members have had with anesthesia.  Any blood disorders you have.  Any surgeries you have had.  Any medical conditions you have.  Whether you are pregnant or may be pregnant. What are the risks? Generally, this is a safe procedure. However, problems may occur, including:  Infection.  Bleeding.  Allergic reactions to medicines.  Damage to nearby structures or organs.  Narrowing of the digestive tract (stenosis).  A stomach or intestinal ulcer.  Developing gallbladder stones.  Leaking digestion fluids.  A blood clot (deep vein thrombosis) that may break loose and travel to the lungs (pulmonary embolism). Long-term risks after surgery may include:  Low levels of vitamins and nutrients (malnutrition).  Sudden episodes of nausea, vomiting, and diarrhea (dumping syndrome).  Low blood sugar (hypoglycemia). What happens before the surgery? Medicines Ask your health care provider about:  Changing or stopping your regular medicines. This is especially important if you are taking diabetes medicines or blood thinners.  Taking medicines such as aspirin and ibuprofen. These medicines can thin your blood. Do not take these medicines unless your health care provider tells you to take them.  Taking over-the-counter medicines, vitamins, herbs, and  supplements. Staying hydrated Follow instructions from your health care provider about hydration, which may include:  Up to 2 hours before the procedure - you may continue to drink clear liquids, such as water, clear fruit juice, black coffee, and plain tea.   Eating and drinking Follow instructions from your health care provider about eating and drinking, which may include:  8 hours before the procedure - stop eating heavy meals or foods, such as meat, fried foods, or fatty foods.  6 hours before the procedure - stop eating light meals or foods, such as toast or cereal.  6 hours before the procedure - stop drinking milk or drinks that contain milk.  2 hours before the procedure - stop drinking clear liquids. General instructions  You may need to lose some weight and become more active before surgery.  Do not use any products that contain nicotine or tobacco for at least 2 weeks before the procedure. These products include cigarettes, e-cigarettes, and chewing tobacco. If you need help quitting, ask your health care provider.  Plan to have someone take you home from the hospital or clinic.  Plan to have a responsible adult care for you for at least 24 hours after you leave the hospital or clinic. This is important.  Ask your health care provider what steps will be taken to help prevent infection. These may include: ? Washing skin with a germ-killing soap. ? Taking antibiotic medicine. What happens during the surgery?  An IV will be inserted into one of your veins.  You will be given: ? A medicine to help you relax (sedative). ? A medicine to make you fall asleep (general anesthetic).  To reduce your risk of a blood clot, a blood-thinning medicine (heparin) may be injected under your skin.  Several small incisions will be made in your abdomen.  A tube-like instrument with a camera on the end (laparoscope) will be placed through one of the incisions. The camera will send images  to a television screen in the operating room. Surgical instruments will be placed through the other incisions.  Your surgeon will remove about 80 percent of your stomach and close the stomach with staples to form your stomach into a tube-like structure. This is called a sleeve gastrectomy.  Next, your surgeon will: ? Cut and separate the upper part of your small intestine (duodenum) below your stomach. ? Cut and separate an area of your small intestine near where your small intestine enters your large intestine. ? Connect the upper part of your small intestine that is still attached to your stomach to the lower part that leads to your large intestine. ? Close the open ends of the small intestine with sutures or staples.  The surgical instruments will be removed, and the incisions will be closed with sutures, surgical glue, or tape.  Bandages (dressings) will be placed over the incisions. The procedure may vary among health care providers and hospitals.   What happens after the surgery?  Your blood pressure, heart rate, breathing rate, and blood oxygen level will be monitored until you leave the hospital or clinic.  You will continue to get fluids through your IV until you are able to drink liquids. You will be started on a liquid diet the first day after surgery.  You will be given medicines as needed. This may include pain medicine, IV antibiotics, or injections of heparin.  Within the first 12 hours after surgery, you will be encouraged to get out of bed and walk around. Summary  The duodenal switch procedure is a surgery to help you lose weight.  In the first part of the procedure, a large portion of your stomach is removed, making your stomach smaller.  In the second part, the smaller stomach is attached to the lower part of your small intestine, allowing food to bypass much of your small intestine.  You will start on a liquid diet in the hospital. This information is not intended  to replace advice given to you by your health care provider. Make sure you discuss any questions you have with your health care provider. Document Revised: 03/14/2019 Document Reviewed: 05/30/2018 Elsevier Patient Education  [place]. Liraglutide injection (Weight Management) What is this medicine? LIRAGLUTIDE (LIR a GLOO tide) is used to help people lose weight and maintain weight loss. It is used with a reduced-calorie diet and exercise. This medicine may be used for other purposes; ask your health care provider or pharmacist if you have questions. COMMON BRAND NAME(S): Saxenda What should I tell my health care provider before I take this medicine? They need to know if you have any of these conditions:  endocrine tumors (MEN 2) or if someone in your family had these tumors  gallbladder disease  high cholesterol  history of alcohol abuse problem  history of pancreatitis  kidney disease or if you are on dialysis  liver disease  previous swelling of the tongue, face, or lips with difficulty breathing, difficulty swallowing, hoarseness, or tightening of the throat  stomach problems  suicidal thoughts, plans, or attempt; a previous suicide attempt by you or a family member  thyroid cancer or if someone in your family had thyroid cancer  an unusual or allergic reaction to liraglutide, other medicines, foods, dyes, or preservatives  pregnant or trying to get pregnant  breast-feeding How should I use this medicine? This medicine is for injection under the skin of your upper leg, stomach area, or upper arm. You will be taught how to prepare and give this medicine. Use exactly as directed. Take your medicine at regular intervals. Do not take it more often than directed. This medicine comes with INSTRUCTIONS FOR USE. Ask your pharmacist for directions on how to use this medicine. Read the information carefully. Talk to your pharmacist or health care provider if you have  questions. It is important that you put your used needles and syringes in a special sharps container. Do not put them in a trash can. If you do not have a sharps container, call your pharmacist or healthcare provider to get one. A special MedGuide will be given to you by the pharmacist with each prescription and refill. Be sure to read this information carefully each time. Talk to your health care provider about the use of this medicine in children. While it may be prescribed for children as young as 69 years of age for selected conditions, precautions do apply. Overdosage: If you think you have taken too much of this medicine contact a poison control center or emergency room at once. NOTE: This medicine is only for you. Do not share this medicine with others. What if I miss a dose? If you miss a dose, take it as soon as you can. If it is almost time for your next dose, take only that dose. Do not take double or extra doses. If you miss your dose for 3 days or more, call your doctor or health care professional to talk about how to restart this medicine. What may interact with this medicine?  insulin and other medicines for diabetes This list may not describe  all possible interactions. Give your health care provider a list of all the medicines, herbs, non-prescription drugs, or dietary supplements you use. Also tell them if you smoke, drink alcohol, or use illegal drugs. Some items may interact with your medicine. What should I watch for while using this medicine? Visit your doctor or health care professional for regular checks on your progress. Drink plenty of fluids while taking this medicine. Check with your doctor or health care professional if you get an attack of severe diarrhea, nausea, and vomiting. The loss of too much body fluid can make it dangerous for you to take this medicine. This medicine may affect blood sugar levels. Ask your healthcare provider if changes in diet or medicines are  needed if you have diabetes. Patients and their families should watch out for worsening depression or thoughts of suicide. Also watch out for sudden changes in feelings such as feeling anxious, agitated, panicky, irritable, hostile, aggressive, impulsive, severely restless, overly excited and hyperactive, or not being able to sleep. If this happens, especially at the beginning of treatment or after a change in dose, call your health care professional. Women should inform their health care provider if they wish to become pregnant or think they might be pregnant. Losing weight while pregnant is not advised and may cause harm to the unborn child. Talk to your health care provider for more information. What side effects may I notice from receiving this medicine? Side effects that you should report to your doctor or health care professional as soon as possible:  allergic reactions like skin rash, itching or hives, swelling of the face, lips, or tongue  breathing problems  diarrhea that continues or is severe  lump or swelling on the neck  severe nausea  signs and symptoms of infection like fever or chills; cough; sore throat; pain or trouble passing urine  signs and symptoms of low blood sugar such as feeling anxious; confusion; dizziness; increased hunger; unusually weak or tired; increased sweating; shakiness; cold, clammy skin; irritable; headache; blurred vision; fast heartbeat; loss of consciousness  signs and symptoms of kidney injury like trouble passing urine or change in the amount of urine  trouble swallowing  unusual stomach upset or pain  vomiting Side effects that usually do not require medical attention (report to your doctor or health care professional if they continue or are bothersome):  constipation  decreased appetite  diarrhea  fatigue  headache  nausea  pain, redness, or irritation at site where injected  stomach upset  stuffy or runny nose This list may  not describe all possible side effects. Call your doctor for medical advice about side effects. You may report side effects to FDA at 1-800-FDA-1088. Where should I keep my medicine? Keep out of the reach of children. Store unopened pen in a refrigerator between 2 and 8 degrees C (36 and 46 degrees F). Do not freeze or use if the medicine has been frozen. Protect from light and excessive heat. After you first use the pen, it can be stored at room temperature between 15 and 30 degrees C (59 and 86 degrees F) or in a refrigerator. Throw away your used pen after 30 days or after the expiration date, whichever comes first. Do not store your pen with the needle attached. If the needle is left on, medicine may leak from the pen. NOTE: This sheet is a summary. It may not cover all possible information. If you have questions about this medicine, talk to your doctor,  pharmacist, or health care provider.  2021 Elsevier/Gold Standard (2019-11-15 13:56:25)

## 2021-01-29 ENCOUNTER — Other Ambulatory Visit: Payer: Self-pay | Admitting: Family Medicine

## 2021-01-29 DIAGNOSIS — I10 Essential (primary) hypertension: Secondary | ICD-10-CM

## 2021-02-04 ENCOUNTER — Other Ambulatory Visit: Payer: Self-pay | Admitting: Family Medicine

## 2021-02-04 DIAGNOSIS — E039 Hypothyroidism, unspecified: Secondary | ICD-10-CM

## 2021-02-19 IMAGING — US US THYROID
1 series · 13 of 25 positions shown · non-contrast
Comparison: 08/29/2019 CT chest

CLINICAL DATA: 1.1 cm right inferior thyroid nodule by CT

EXAM:
THYROID ULTRASOUND
TECHNIQUE: Ultrasound examination of the thyroid gland and adjacent soft
tissues was performed.

[Series 1: us thyroid · 0.07mm/px · 13 of 55 slices shown]
[im 1/55]
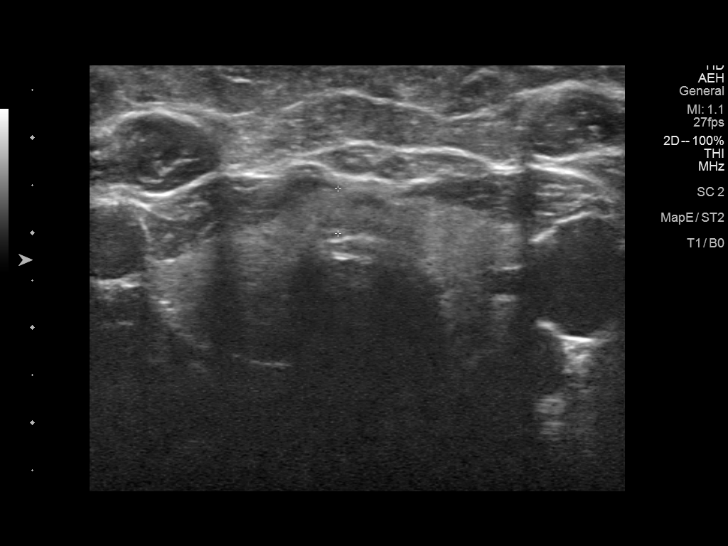
[im 5/55]
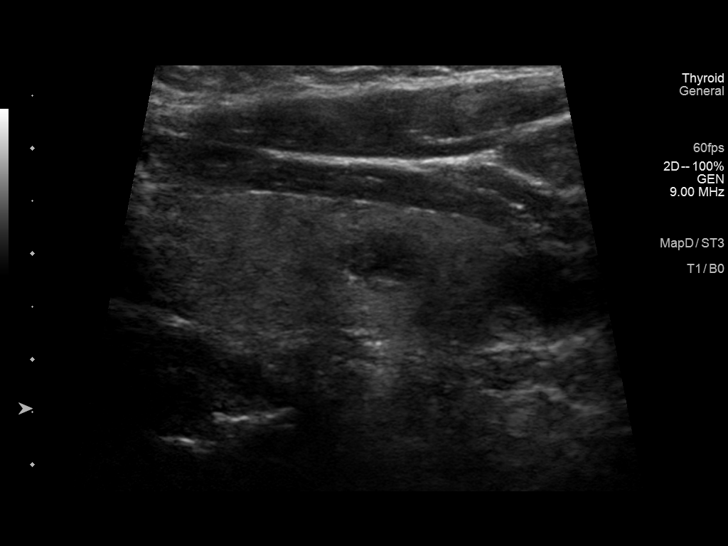
[im 10/55]
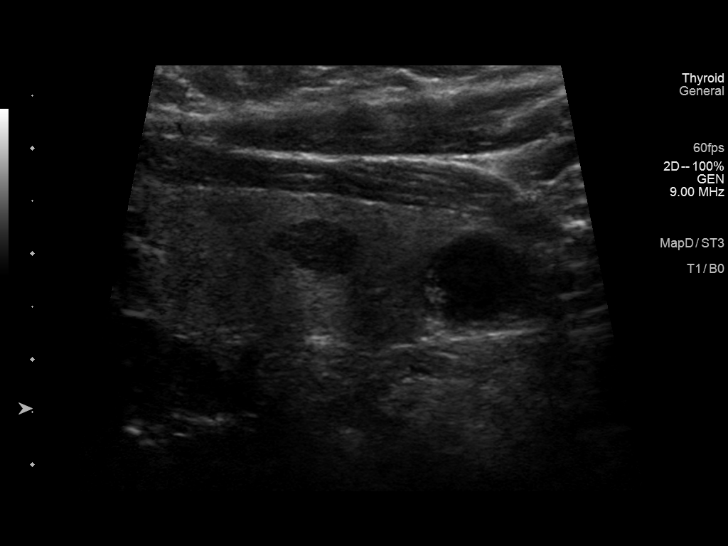
[im 14/55]
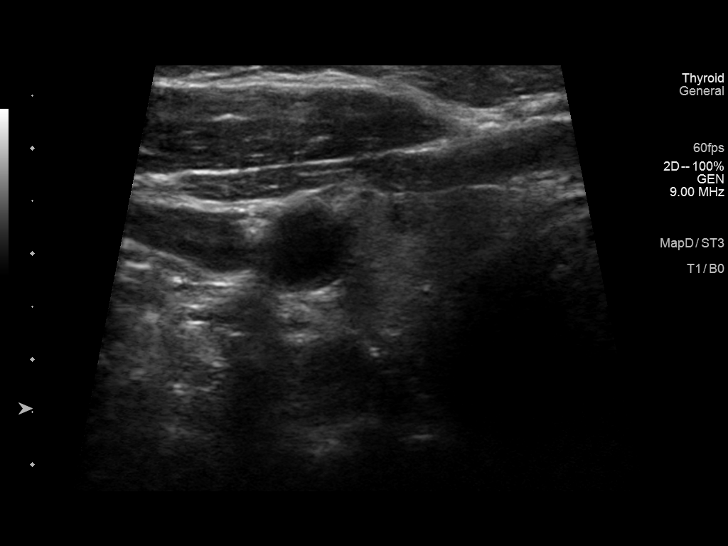
[im 19/55]
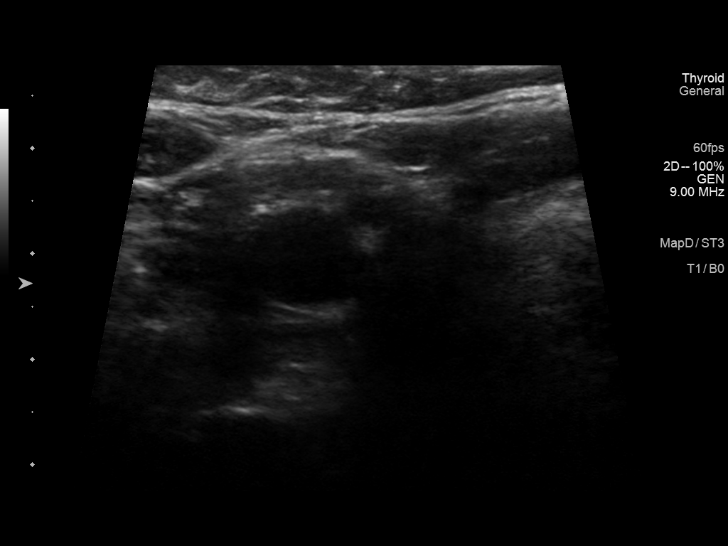
[im 23/55]
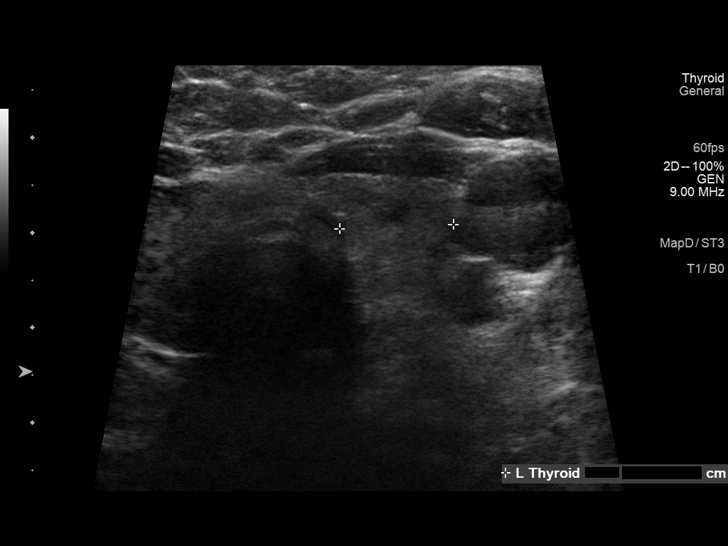
[im 28/55]
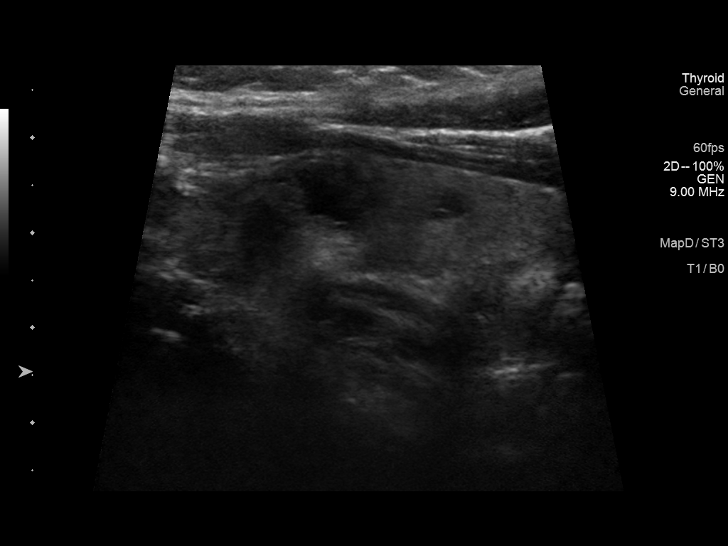
[im 32/55]
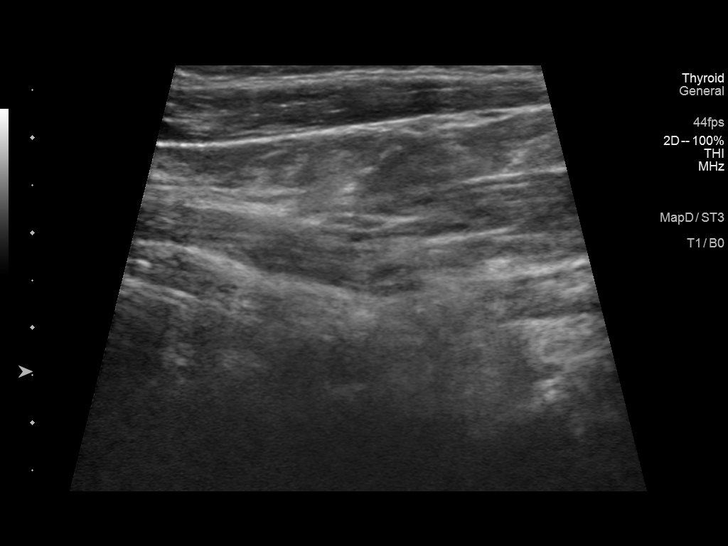
[im 37/55]
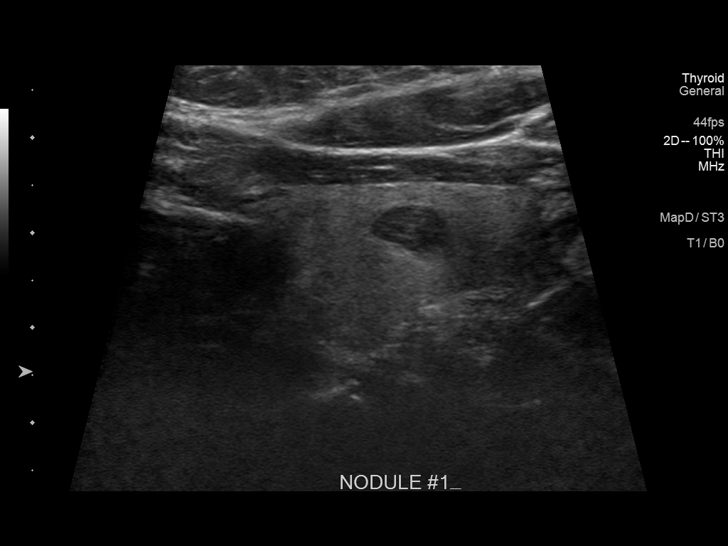
[im 41/55]
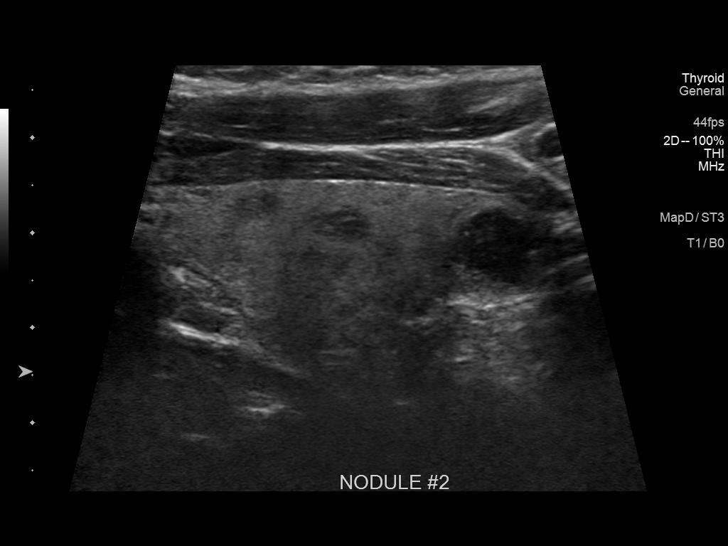
[im 46/55]
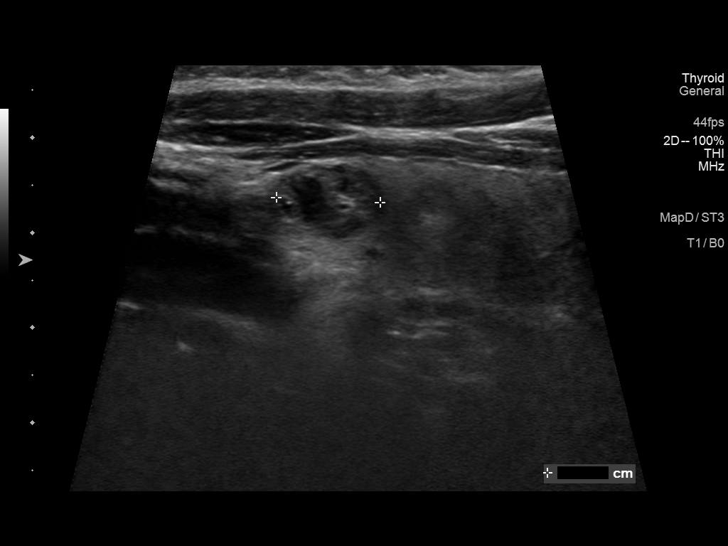
[im 50/55]
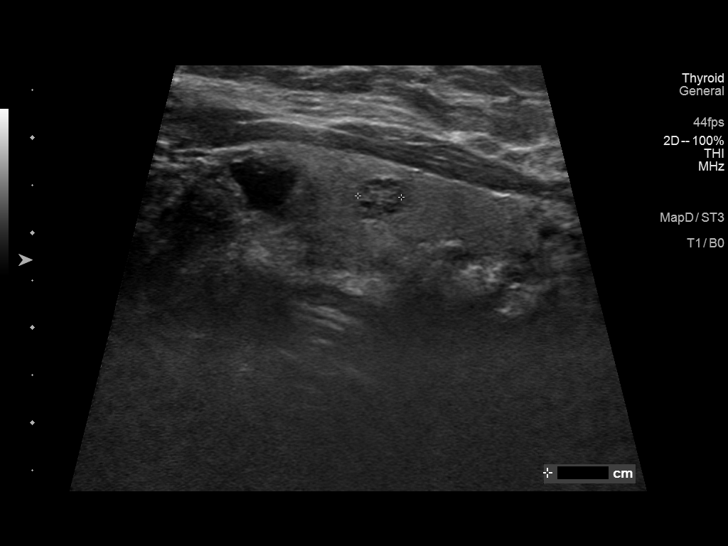
[im 55/55]
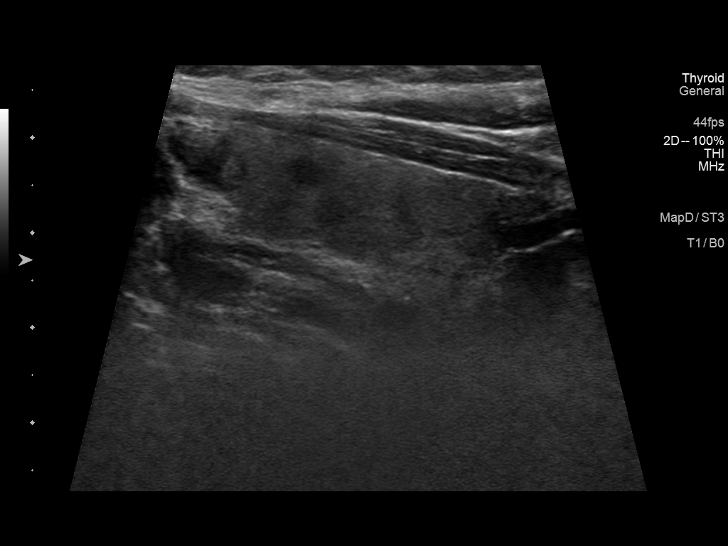

[13 of 25 positions shown; findings below may reference images not displayed]

FINDINGS: Parenchymal Echotexture: Moderately heterogenous

Isthmus: 5 mm

Right lobe: 4.4 x 1.3 x 1.6 cm

Left lobe: 4.0 x 1.3 x 1.2 cm

_________________________________________________________

Estimated total number of nodules >/= 1 cm: 2

Number of spongiform nodules >/=  2 cm not described below (TR1): 0

Number of mixed cystic and solid nodules >/= 1.5 cm not described
below (TR2): 0

_________________________________________________________

Nodule # 2:

Location: Right; Inferior

Maximum size: 1.1 cm; Other 2 dimensions: 0.9 x 0.7 cm

Composition: mixed cystic and solid (1)

Echogenicity: hypoechoic (2)

Shape: not taller-than-wide (0)

Margins: ill-defined (0)

Echogenic foci: none (0)

ACR TI-RADS total points: 3.

ACR TI-RADS risk category: TR3 (3 points).

ACR TI-RADS recommendations:

Given size (<1.4 cm) and appearance, this nodule does NOT meet
TI-RADS criteria for biopsy or dedicated follow-up.

_________________________________________________________

Nodule # 3:

Location: Left; Superior

Maximum size: 1.1 cm; Other 2 dimensions: 1.0 x 0.7 cm

Composition: mixed cystic and solid (1)

Echogenicity: isoechoic (1)

Shape: not taller-than-wide (0)

Margins: ill-defined (0)

Echogenic foci: none (0)

ACR TI-RADS total points: 2.

ACR TI-RADS risk category: TR2 (2 points).

ACR TI-RADS recommendations:

This nodule does NOT meet TI-RADS criteria for biopsy or dedicated
follow-up.

_________________________________________________________

There are additional bilateral cystic and isoechoic nodules noted
all measuring 8 mm or less in size. These would not meet criteria
for any biopsy or follow-up. Normal vascularity. No regional
adenopathy.
IMPRESSION: Moderate thyroid heterogeneity with multiple benign subcentimeter
nodules noted.

1.1 cm right inferior TR 3 nodule and 1.1 cm left superior TR 2
nodule noted. These dominant nodules do not meet criteria for any
biopsy or follow-up.

The above is in keeping with the ACR TI-RADS recommendations - [HOSPITAL] 7392;[DATE].

## 2021-03-03 ENCOUNTER — Ambulatory Visit: Payer: Self-pay | Admitting: Allergy & Immunology

## 2021-03-05 ENCOUNTER — Other Ambulatory Visit: Payer: Self-pay | Admitting: Family Medicine

## 2021-03-05 DIAGNOSIS — E039 Hypothyroidism, unspecified: Secondary | ICD-10-CM

## 2021-03-20 ENCOUNTER — Other Ambulatory Visit: Payer: Self-pay | Admitting: Family Medicine

## 2021-03-20 DIAGNOSIS — E039 Hypothyroidism, unspecified: Secondary | ICD-10-CM

## 2021-03-24 ENCOUNTER — Other Ambulatory Visit: Payer: Self-pay | Admitting: Family Medicine

## 2021-03-24 DIAGNOSIS — I10 Essential (primary) hypertension: Secondary | ICD-10-CM

## 2021-04-01 DIAGNOSIS — M7661 Achilles tendinitis, right leg: Secondary | ICD-10-CM | POA: Diagnosis not present

## 2021-04-01 DIAGNOSIS — M722 Plantar fascial fibromatosis: Secondary | ICD-10-CM | POA: Diagnosis not present

## 2021-04-01 DIAGNOSIS — M7731 Calcaneal spur, right foot: Secondary | ICD-10-CM | POA: Diagnosis not present

## 2021-04-01 DIAGNOSIS — M24571 Contracture, right ankle: Secondary | ICD-10-CM | POA: Diagnosis not present

## 2021-04-01 DIAGNOSIS — M792 Neuralgia and neuritis, unspecified: Secondary | ICD-10-CM | POA: Diagnosis not present

## 2021-04-10 ENCOUNTER — Telehealth: Payer: Self-pay

## 2021-04-10 NOTE — Telephone Encounter (Signed)
She was a patient of mine previously but she changed providers. I would like to know her reason for changing providers before I decide to accept her back as a patient.

## 2021-04-10 NOTE — Telephone Encounter (Signed)
Copied from [place] [id number]. Topic: Appointment Scheduling - New Patient >> Apr 10, 2021 11:03 AM Alanda Slim E wrote: New patient would like to be scheduled for your office. Provider: Webb Silversmith Pt is the wife of Derrian Rodak which is Regina's pt/ please advise if Rollene Fare would accept her as a NP  Date of Appointment:   Route to department's PEC pool.

## 2021-04-14 ENCOUNTER — Telehealth: Payer: Self-pay | Admitting: Family Medicine

## 2021-04-14 NOTE — Progress Notes (Signed)
  Chronic Care Management   Outreach Note  04/14/2021 Name: Maria Franklin MRN: [id number] DOB: March 25, 1961  Referred by: Billie Ruddy, MD Reason for referral : No chief complaint on file.   An unsuccessful telephone outreach was attempted today. The patient was referred to the pharmacist for assistance with care management and care coordination.   Follow Up Plan:   Carley Perdue [employer] Scheduler

## 2021-04-23 ENCOUNTER — Encounter: Payer: Self-pay | Admitting: Internal Medicine

## 2021-04-23 ENCOUNTER — Ambulatory Visit (INDEPENDENT_AMBULATORY_CARE_PROVIDER_SITE_OTHER): Payer: Medicare HMO | Admitting: Internal Medicine

## 2021-04-23 ENCOUNTER — Telehealth: Payer: Self-pay

## 2021-04-23 ENCOUNTER — Other Ambulatory Visit: Payer: Self-pay

## 2021-04-23 VITALS — BP 117/66 | HR 100 | Temp 97.7°F | Resp 18 | Ht 62.0 in | Wt 267.4 lb

## 2021-04-23 DIAGNOSIS — E119 Type 2 diabetes mellitus without complications: Secondary | ICD-10-CM

## 2021-04-23 DIAGNOSIS — E1122 Type 2 diabetes mellitus with diabetic chronic kidney disease: Secondary | ICD-10-CM

## 2021-04-23 DIAGNOSIS — E782 Mixed hyperlipidemia: Secondary | ICD-10-CM

## 2021-04-23 DIAGNOSIS — G4733 Obstructive sleep apnea (adult) (pediatric): Secondary | ICD-10-CM

## 2021-04-23 DIAGNOSIS — K122 Cellulitis and abscess of mouth: Secondary | ICD-10-CM

## 2021-04-23 DIAGNOSIS — I10 Essential (primary) hypertension: Secondary | ICD-10-CM

## 2021-04-23 DIAGNOSIS — J4530 Mild persistent asthma, uncomplicated: Secondary | ICD-10-CM

## 2021-04-23 DIAGNOSIS — E039 Hypothyroidism, unspecified: Secondary | ICD-10-CM | POA: Diagnosis not present

## 2021-04-23 DIAGNOSIS — K219 Gastro-esophageal reflux disease without esophagitis: Secondary | ICD-10-CM

## 2021-04-23 DIAGNOSIS — M79671 Pain in right foot: Secondary | ICD-10-CM

## 2021-04-23 DIAGNOSIS — M79672 Pain in left foot: Secondary | ICD-10-CM

## 2021-04-23 DIAGNOSIS — R69 Illness, unspecified: Secondary | ICD-10-CM | POA: Diagnosis not present

## 2021-04-23 DIAGNOSIS — F200 Paranoid schizophrenia: Secondary | ICD-10-CM

## 2021-04-23 DIAGNOSIS — N1831 Chronic kidney disease, stage 3a: Secondary | ICD-10-CM

## 2021-04-23 NOTE — Assessment & Plan Note (Signed)
Controlled on Amlodipine, Hydralazine, Lisinopril and Carvedilol She would like to stop any of these medications if she cane Carvedilol d/c'd Reinforced DASH diet and exercise for weight loss CMET today

## 2021-04-23 NOTE — Assessment & Plan Note (Signed)
Not wearing CPAP Encouraged weight loss as this can help reduce reflux symptoms

## 2021-04-23 NOTE — Progress Notes (Signed)
Subjective:    Patient ID: Maria Franklin, female    DOB: [birth year], 60 y.o.   MRN: [id number]  HPI  Patient presents the clinic today to establish care.  She is transferring care from Grier Mitts, MD.  HTN: Her BP today is 117/66.  She is taking Amlodipine, Carvedilol, Hydralazine and Lisinopril as prescribed.  ECG from 11/2020 reviewed.  HLD with Ludwig's Angina: Her last LDL was 69, triglycerides 83, 07/2020.  She denies myalgias on Atorvastatin.  She is not taking Aspirin.  GERD: Triggered by eating late at night.  She takes Omeprazole with good relief of symptoms.  There is no upper GI on file.  Asthma: She denies chronic cough or shortness of breath.  She is not using Advair as prescribed and Albuterol as prescribed.  There are no PFTs on file. She follows with pulmonology.  OSA: She averages 8 hours of sleep per night without the use of her CPAP.  Sleep study from 10/2019 reviewed.  Hypothyroidism: She denies any issues on her current dose of Levothyroxine.  She does not follow with endocrinology.  DM2: Her last A1c was 6.3%, 07/2020.  She is not currently taking any oral diabetic medication.  She does not check her sugars.  She checks her feet routinely.  Her last eye exam was in 2021.  Flu 09/2020.  Pneumovax 03/2019.  COVID-Pfizer x3.  CKD 3: Last creatinine was 1.43, GFR 44, 11/2020.  She is taking Lisinopril for renal protection.  She does not follow with nephrology.  Paranoid Schizophrenia: Managed with Haloperidol.  She denies audio/visual hallucinations or paranoia at this time.  She follows with psychiatry.  She c/o bilateral pain in her feet. She is currently being treated by podiatry for plantar fasciitis, she has been getting injections, wearing splints at night, wearing a boot without any relief.  Review of Systems      Past Medical History:  Diagnosis Date  . Allergy   . Anemia   . Angio-edema   . Asthma    ALL THE TIME   LAST FLARE UP 12/2018  . Chest  pain 08/2019  . Chicken pox   . Diabetes mellitus without complication ([place])   . GERD (gastroesophageal reflux disease)   . Hyperlipidemia   . Hypertension   . Hyperthyroidism   . Recurrent upper respiratory infection (URI)   . Schizophrenia ([place])   . Sleep apnea    DX 2-3 YRS AGO.  Marland Kitchen Thyroid disease   . Urticaria     Current Outpatient Medications  Medication Sig Dispense Refill  . albuterol (VENTOLIN HFA) 108 (90 Base) MCG/ACT inhaler INHALE 1 TO 2 PUFFS INTO THE LUNGS EVERY 6 HOURS AS NEEDED WHEEZING/SHORTNESS OF BREATH 18 each 0  . amLODipine (NORVASC) 10 MG tablet TAKE 1 TABLET BY MOUTH EVERY DAY 90 tablet 3  . aspirin EC 81 MG EC tablet Take 1 tablet (81 mg total) by mouth daily. 30 tablet 0  . atorvastatin (LIPITOR) 40 MG tablet Take 1 tablet (40 mg total) by mouth daily at 6 PM. 90 tablet 3  . carvedilol (COREG) 12.5 MG tablet Take 1 tablet (12.5 mg total) by mouth 2 (two) times daily with a meal. 180 tablet 3  . Ferrous Sulfate (IRON) 325 (65 Fe) MG TABS Take 1 tablet by mouth every morning.    . fluticasone (FLONASE) 50 MCG/ACT nasal spray Place 2 sprays into both nostrils 2 (two) times a day. (Patient taking differently: Place 2 sprays into both nostrils  daily.) 16 g 5  . Fluticasone-Salmeterol (ADVAIR DISKUS) 250-50 MCG/DOSE AEPB Inhale 1 puff into the lungs 2 (two) times daily. 180 each 1  . haloperidol (HALDOL) 5 MG tablet Take 0.5 tablets (2.5 mg total) by mouth at bedtime. Take 1/2 tab at bed time 30 tablet 3  . hydrALAZINE (APRESOLINE) 25 MG tablet Take 0.5 tablets (12.5 mg total) by mouth in the morning and at bedtime. 180 tablet 3  . levothyroxine (SYNTHROID) 50 MCG tablet TAKE 1 TABLET BY MOUTH EVERY DAY BEFORE BREAKFAST 90 tablet 1  . lisinopril (ZESTRIL) 40 MG tablet TAKE 1 TABLET BY MOUTH EVERY DAY 90 tablet 3   No current facility-administered medications for this visit.    Allergies  Allergen Reactions  . Bee Pollen Cough  . Pollen Extract Cough  . Latex  Itching, Rash and Other (See Comments)    Severe itching    Family History  Problem Relation Age of Onset  . Mental illness Mother   . Hypertension Mother   . Schizophrenia Mother   . Depression Mother   . Cancer Father        LUNG  . Cancer Sister        BREAST  . Breast cancer Sister   . Mental illness Brother   . Hypertension Brother   . Schizophrenia Brother   . Depression Brother   . Heart disease Maternal Grandmother   . Depression Maternal Grandmother   . Schizophrenia Maternal Grandmother   . Schizophrenia Brother   . Schizophrenia Maternal Uncle   . Breast cancer Maternal Aunt   . Heart attack Sister   . Heart attack Sister   . Heart attack Sister   . Allergic rhinitis Neg Hx   . Angioedema Neg Hx   . Asthma Neg Hx   . Atopy Neg Hx   . Eczema Neg Hx   . Immunodeficiency Neg Hx   . Urticaria Neg Hx     Social History   Socioeconomic History  . Marital status: Married    Spouse name: Not on file  . Number of children: Not on file  . Years of education: Not on file  . Highest education level: Not on file  Occupational History  . Not on file  Tobacco Use  . Smoking status: Former Smoker    Packs/day: 0.25    Years: 6.00    Pack years: 1.50  . Smokeless tobacco: Never Used  . Tobacco comment: QUIT IN HER EARLY 20'S  Vaping Use  . Vaping Use: Never used  Substance and Sexual Activity  . Alcohol use: No    Alcohol/week: 0.0 standard drinks  . Drug use: No  . Sexual activity: Not Currently  Other Topics Concern  . 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 Difficulty of Paying Living Expenses: Not hard at all  Food Insecurity: No Food Insecurity  . Worried About  Charity fundraiser in the Last Year: Never true  . Ran Out of Food in the Last Year: Never true  Transportation Needs: No Transportation Needs  . Lack of Transportation (Medical): No  . Lack of Transportation (Non-Medical): No  Physical Activity: Inactive  . Days of Exercise per Week: 0 days  . Minutes of Exercise per Session: 0 min  Stress: No Stress Concern Present  . Feeling of Stress : Not at all  Social Connections: Socially Integrated  . Frequency of Communication with Friends and Family: More than three times a week  . Frequency of Social Gatherings with Friends and Family: Once a week  . Attends Religious Services: More than 4 times per year  . Active Member of Clubs or Organizations: Yes  . Attends Archivist Meetings: More than 4 times per year  . Marital Status: Married  Human resources officer Violence: Not At Risk  . Fear of Current or Ex-Partner: No  . Emotionally Abused: No  . Physically Abused: No  . Sexually Abused: No     Constitutional: Denies fever, malaise, fatigue, headache or abrupt weight changes.  HEENT: Denies eye pain, eye redness, ear pain, ringing in the ears, wax buildup, runny nose, nasal congestion, bloody nose, or sore throat. Respiratory: Denies difficulty breathing, shortness of breath, cough or sputum production.   Cardiovascular: Pt reports swelling in legs. Denies chest pain, chest tightness, palpitations or swelling in the hands.  Gastrointestinal: Denies abdominal pain, bloating, constipation, diarrhea or blood in the stool.  GU: Denies urgency, frequency, pain with urination, burning sensation, blood in urine, odor or discharge. Musculoskeletal: Pt reports bilateral foot pain. Denies decrease in range of motion, difficulty with gait, muscle pain or joint swelling.  Skin: Denies redness, rashes, lesions or ulcercations.  Neurological: Denies dizziness, difficulty with memory, difficulty with speech or problems with balance and coordination.   Psych: Denies anxiety, depression, SI/HI.  No other specific complaints in a complete review of systems (except as listed in HPI above).  Objective:   Physical Exam  BP 117/66 (BP Location: Right Arm, Patient Position: Sitting, Cuff Size: Large)   Pulse 100   Temp 97.7 F (36.5 C) (Temporal)   Resp 18   Ht 5\' 2"  (1.575 m)   Wt 267 lb 6.4 oz (121.3 kg)   LMP 02/02/2017 (Approximate)   SpO2 100%   BMI 48.91 kg/m   Wt Readings from Last 3 Encounters:  01/23/21 261 lb 6.4 oz (118.6 kg)  12/17/20 259 lb (117.5 kg)  11/18/20 260 lb (117.9 kg)    General: Appears her stated age, obese, in NAD. Skin: Warm, dry and intact. No ulcerations noted. HEENT: Head: normal shape and size; Eyes: sclera white and EOMs intact; Neck:  Neck supple, trachea midline. No masses, lumps present. Thyromegaly noted. Cardiovascular: Normal rate and rhythm. S1,S2 noted.  No murmur, rubs or gallops noted. Trace pitting BLE edema. No carotid bruits noted. Pulmonary/Chest: Normal effort and positive vesicular breath sounds. No respiratory distress. No wheezes, rales or ronchi noted.  Abdomen:  Normal bowel sounds.  Musculoskeletal: No difficulty with gait.  Neurological: Alert and oriented.  Psychiatric: Mood and affect normal. Behavior is normal. Judgment and thought content normal.     BMET    Component Value Date/Time   NA 136 11/18/2020 0939   K 4.6 11/18/2020 0939   CL 102 11/18/2020 0939   CO2 28 11/18/2020 0939   GLUCOSE 120 (H) 11/18/2020 0939   BUN 24 (H) 11/18/2020 0939   CREATININE 1.39 (H) 11/18/2020 0939   CREATININE 1.14 (H) 07/28/2020 0942   CALCIUM 9.1 11/18/2020 0939   GFRNONAA 44 (L) 11/18/2020 0939   GFRNONAA 53 (L) 07/28/2020 0942   GFRAA 61 07/28/2020 1191  Lipid Panel     Component Value Date/Time   CHOL 156 07/28/2020 0942   TRIG 83 07/28/2020 0942   HDL 70 07/28/2020 0942   CHOLHDL 2.2 07/28/2020 0942   VLDL 18 10/17/2019 0630   LDLCALC 69 07/28/2020 0942     CBC    Component Value Date/Time   WBC 7.0 11/18/2020 0939   RBC 3.81 (L) 11/18/2020 0939   HGB 10.4 (L) 11/18/2020 0939   HCT 33.9 (L) 11/18/2020 0939   PLT 266 11/18/2020 0939   MCV 89.0 11/18/2020 0939   MCH 27.3 11/18/2020 0939   MCHC 30.7 11/18/2020 0939   RDW 13.4 11/18/2020 0939   LYMPHSABS 2.6 11/18/2020 0939   MONOABS 0.5 11/18/2020 0939   EOSABS 0.4 11/18/2020 0939   BASOSABS 0.0 11/18/2020 0939    Hgb A1C Lab Results  Component Value Date   HGBA1C 6.3 (H) 07/28/2020          Assessment & Plan:   Bilateral Foot Pain:  Will check uric acid to r/o gout  RTC in 6 months for your Medicare Wellness Exam  Webb Silversmith, NP This visit occurred during the SARS-CoV-2 public health emergency.  Safety protocols were in place, including screening questions prior to the visit, additional usage of staff PPE, and extensive cleaning of exam room while observing appropriate contact time as indicated for disinfecting solutions.

## 2021-04-23 NOTE — Chronic Care Management (AMB) (Signed)
  Care Management   Note  04/23/2021 Name: Maria Franklin MRN: [id number] DOB: 02/19/1961  [name] is a 60 y.o. year old female who is a primary care patient of Jearld Fenton, NP. I reached out to [name] by phone today in response to a referral sent by [name]'s health plan.    [name] was given information about care management services today including:  1. Care management services include personalized support from designated clinical staff supervised by her physician, including individualized plan of care and coordination with other care providers 2. 24/7 contact phone numbers for assistance for urgent and routine care needs. 3. The patient may stop care management services at any time by phone call to the office staff.  Patient agreed to services and verbal consent obtained.   Follow up plan: Telephone appointment with care management team member scheduled for:04/24/2021  Noreene Larsson, [place], [place], [place] [address] Direct Dial: [phone] [email or web address] Website: [email or web address]

## 2021-04-23 NOTE — Assessment & Plan Note (Signed)
Stable on Haloperidol She will continue to see psychiatry, will follow

## 2021-04-23 NOTE — Assessment & Plan Note (Signed)
Difficulty affording Advair, CCM referral placed Continue Albuterol as needed She will continue to see pulmonology, will follow

## 2021-04-23 NOTE — Assessment & Plan Note (Signed)
CMET and Lipid profile today Encouraged her to consume a low fat diet Continue Atorvastatin

## 2021-04-23 NOTE — Patient Instructions (Signed)

## 2021-04-23 NOTE — Assessment & Plan Note (Signed)
TSH and Free T4 today Will adjust Levothyroxine if needed based on labs 

## 2021-04-23 NOTE — Assessment & Plan Note (Signed)
No angina D/C Carvedilol Start baby Aspirin

## 2021-04-23 NOTE — Assessment & Plan Note (Signed)
Avoid foods that trigger your reflux, eating late at night Encouraged weight loss as this can help reduce reflux symptoms CBC and CMET today Continue Omeprazole

## 2021-04-23 NOTE — Assessment & Plan Note (Addendum)
A1C today No urine microalbumin secondary to ACEI therapy Diet controlled Consider Ozempic for weight management Encouraged routine eye exams, will request copy Encouraged routine foot exams Flu, pneumovax UTD Encouraged her to get her Covid booster

## 2021-04-24 ENCOUNTER — Ambulatory Visit (INDEPENDENT_AMBULATORY_CARE_PROVIDER_SITE_OTHER): Payer: Medicare HMO | Admitting: Pharmacist

## 2021-04-24 DIAGNOSIS — J4530 Mild persistent asthma, uncomplicated: Secondary | ICD-10-CM

## 2021-04-24 DIAGNOSIS — E119 Type 2 diabetes mellitus without complications: Secondary | ICD-10-CM

## 2021-04-24 DIAGNOSIS — E039 Hypothyroidism, unspecified: Secondary | ICD-10-CM | POA: Diagnosis not present

## 2021-04-24 DIAGNOSIS — I10 Essential (primary) hypertension: Secondary | ICD-10-CM | POA: Diagnosis not present

## 2021-04-24 LAB — COMPREHENSIVE METABOLIC PANEL
AG Ratio: 1.5 (calc) (ref 1.0–2.5)
ALT: 21 U/L (ref 6–29)
AST: 19 U/L (ref 10–35)
Albumin: 4.1 g/dL (ref 3.6–5.1)
Alkaline phosphatase (APISO): 87 U/L (ref 37–153)
BUN/Creatinine Ratio: 17 (calc) (ref 6–22)
BUN: 20 mg/dL (ref 7–25)
CO2: 27 mmol/L (ref 20–32)
Calcium: 9.3 mg/dL (ref 8.6–10.4)
Chloride: 105 mmol/L (ref 98–110)
Creat: 1.2 mg/dL — ABNORMAL HIGH (ref 0.50–1.05)
Globulin: 2.8 g/dL (calc) (ref 1.9–3.7)
Glucose, Bld: 132 mg/dL — ABNORMAL HIGH (ref 65–99)
Potassium: 4.3 mmol/L (ref 3.5–5.3)
Sodium: 140 mmol/L (ref 135–146)
Total Bilirubin: 0.3 mg/dL (ref 0.2–1.2)
Total Protein: 6.9 g/dL (ref 6.1–8.1)

## 2021-04-24 LAB — LIPID PANEL
Cholesterol: 170 mg/dL (ref <200)
HDL: 74 mg/dL (ref >=50)
LDL Cholesterol (Calc): 78 mg/dL (calc)
Non-HDL Cholesterol (Calc): 96 mg/dL (calc) (ref <130)
Total CHOL/HDL Ratio: 2.3 (calc) (ref <5.0)
Triglycerides: 99 mg/dL (ref <150)

## 2021-04-24 LAB — CBC
HCT: 35.5 % (ref 35.0–45.0)
Hemoglobin: 10.8 g/dL — ABNORMAL LOW (ref 11.7–15.5)
MCH: 26.8 pg — ABNORMAL LOW (ref 27.0–33.0)
MCHC: 30.4 g/dL — ABNORMAL LOW (ref 32.0–36.0)
MCV: 88.1 fL (ref 80.0–100.0)
MPV: 9.2 fL (ref 7.5–12.5)
Platelets: 256 10*3/uL (ref 140–400)
RBC: 4.03 10*6/uL (ref 3.80–5.10)
RDW: 12.9 % (ref 11.0–15.0)
WBC: 8 10*3/uL (ref 3.8–10.8)

## 2021-04-24 LAB — HEMOGLOBIN A1C
Hgb A1c MFr Bld: 6.3 % of total Hgb — ABNORMAL HIGH (ref <5.7)
Mean Plasma Glucose: 134 mg/dL
eAG (mmol/L): 7.4 mmol/L

## 2021-04-24 LAB — TSH: TSH: 2.06 mIU/L (ref 0.40–4.50)

## 2021-04-24 LAB — URIC ACID: Uric Acid, Serum: 6.5 mg/dL (ref 2.5–7.0)

## 2021-04-24 LAB — T4, FREE: Free T4: 1.1 ng/dL (ref 0.8–1.8)

## 2021-04-26 NOTE — Chronic Care Management (AMB) (Signed)
Chronic Care Management Pharmacy Note  04/24/2021 Name:  Maria Franklin MRN:  [id number] DOB:  Nov 04, 1961  Subjective: Maria Franklin is an 60 y.o. year old female who is a primary patient of Jearld Fenton, NP.  The CCM team was consulted for assistance with disease management and care coordination needs.    Engaged with patient by telephone for initial visit in response to provider referral for pharmacy case management and/or care coordination services.   Consent to Services:  The patient was given information about Chronic Care Management services, agreed to services, and gave verbal consent prior to initiation of services.  Please see initial visit note for detailed documentation.   Patient Care Team: Jearld Fenton, NP as PCP - General (Internal Medicine) Jettie Booze, MD as PCP - Cardiology (Cardiology) Alitzel Cookson, Virl Diamond, RPH-CPP (Pharmacist)  Recent office visits: Office Visit with PCP on 5/19 to establish care.   Hospital visits: None in previous 6 months  Objective:  Lab Results  Component Value Date   CREATININE 1.20 (H) 04/23/2021   CREATININE 1.39 (H) 11/18/2020   CREATININE 1.14 (H) 07/28/2020  Calculated CrCl (based on adjusted body weight): ~63 mL/min  Lab Results  Component Value Date   HGBA1C 6.3 (H) 04/23/2021   Last diabetic Eye exam:  Lab Results  Component Value Date/Time   HMDIABEYEEXA No Retinopathy 08/03/2018 04:45 PM    Last diabetic Foot exam: No results found for: HMDIABFOOTEX      Component Value Date/Time   CHOL 170 04/23/2021 1055   TRIG 99 04/23/2021 1055   HDL 74 04/23/2021 1055   CHOLHDL 2.3 04/23/2021 1055   VLDL 18 10/17/2019 0630   LDLCALC 78 04/23/2021 1055   LDLDIRECT 97.1 01/14/2014 1224    Hepatic Function Latest Ref Rng & Units 04/23/2021 07/28/2020 10/17/2019  Total Protein 6.1 - 8.1 g/dL 6.9 6.9 7.8  Albumin 3.5 - 5.0 g/dL - - 3.8  AST 10 - 35 U/L $Remo'19 20 23  'xPgdg$ ALT 6 - 29 U/L $Remo'21 19 24  'pHqqH$ Alk Phosphatase  38 - 126 U/L - - 89  Total Bilirubin 0.2 - 1.2 mg/dL 0.3 0.3 0.6    Lab Results  Component Value Date/Time   TSH 2.06 04/23/2021 10:55 AM   TSH 3.27 07/28/2020 09:42 AM   FREET4 1.1 04/23/2021 10:55 AM   FREET4 1.0 07/28/2020 09:42 AM   Clinical ASCVD: No  The 10-year ASCVD risk score Mikey Bussing DC Jr., et al., 2013) is: 8.1%   Values used to calculate the score:     Age: 68 years     Sex: Female     Is Non-Hispanic African American: Yes     Diabetic: Yes     Tobacco smoker: No     Systolic Blood Pressure: 881 mmHg     Is BP treated: Yes     HDL Cholesterol: 74 mg/dL     Total Cholesterol: 170 mg/dL     Social History   Tobacco Use  Smoking Status Former Smoker  . Packs/day: 0.25  . Years: 6.00  . Pack years: 1.50  Smokeless Tobacco Never Used  Tobacco Comment   QUIT IN HER EARLY 20'S   BP Readings from Last 3 Encounters:  04/23/21 117/66  01/23/21 119/80  12/17/20 112/76   Pulse Readings from Last 3 Encounters:  04/23/21 100  01/23/21 94  12/17/20 80   Wt Readings from Last 3 Encounters:  04/23/21 267 lb 6.4 oz (121.3 kg)  01/23/21  261 lb 6.4 oz (118.6 kg)  12/17/20 259 lb (117.5 kg)    Assessment: Review of patient past medical history, allergies, medications, health status, including review of consultants reports, laboratory and other test data, was performed as part of comprehensive evaluation and provision of chronic care management services.   SDOH:  (Social Determinants of Health) assessments and interventions performed: none   CCM Care Plan  Allergies  Allergen Reactions  . Bee Pollen Cough  . Pollen Extract Cough  . Latex Itching, Rash and Other (See Comments)    Severe itching    Medications Reviewed Today    Reviewed by Vella Raring, RPH-CPP (Pharmacist) on 04/24/21 at [facility] List Status: <None>  Medication Order Taking? Sig Documenting Provider Last Dose Status Informant  albuterol (VENTOLIN HFA) 108 (90 Base) MCG/ACT inhaler  [id number] Yes INHALE 1 TO 2 PUFFS INTO THE LUNGS EVERY 6 HOURS AS NEEDED WHEEZING/SHORTNESS OF Katy Apo, MD Taking Active   amLODipine (NORVASC) 10 MG tablet [id number] Yes TAKE 1 TABLET BY MOUTH EVERY DAY Billie Ruddy, MD Taking Active   aspirin EC 81 MG EC tablet [id number]  Take 1 tablet (81 mg total) by mouth daily.  Patient not taking: Reported on 04/23/2021   Hosie Poisson, MD  Active   atorvastatin (LIPITOR) 40 MG tablet [id number] Yes Take 1 tablet (40 mg total) by mouth daily at 6 PM. Billie Ruddy, MD Taking Active   cetirizine (ZYRTEC) 10 MG tablet [id number] Yes Take 10 mg by mouth daily. [provider] Taking Active   Ferrous Sulfate (IRON) 325 (65 Fe) MG TABS [id number] Yes Take 1 tablet by mouth every morning. [provider] Taking Active   fluticasone (FLONASE) 50 MCG/ACT nasal spray [id number] Yes Place 2 sprays into both nostrils 2 (two) times a day.  Patient taking differently: Place 2 sprays into both nostrils daily.   Valentina Shaggy, MD Taking Active   Fluticasone-Salmeterol (ADVAIR DISKUS) 250-50 Earlie Raveling [id number] Yes Inhale 1 puff into the lungs 2 (two) times daily.  Patient taking differently: Inhale 1 puff into the lungs daily.   Valentina Shaggy, MD Taking Active   haloperidol (HALDOL) 5 MG tablet [id number] Yes Take 0.5 tablets (2.5 mg total) by mouth at bedtime. Take 1/2 tab at bed time  Patient taking differently: Take 0.25 mg by mouth at bedtime. Take 1/2 tab at bed time   Arfeen, Arlyce Harman, MD Taking Active   hydrALAZINE (APRESOLINE) 25 MG tablet [id number] Yes Take 0.5 tablets (12.5 mg total) by mouth in the morning and at bedtime. Billie Ruddy, MD Taking Active   levothyroxine (SYNTHROID) 50 MCG tablet [id number] Yes TAKE 1 TABLET BY MOUTH EVERY DAY BEFORE BREAKFAST Billie Ruddy, MD Taking Active   lisinopril (ZESTRIL) 40 MG tablet [id number] Yes TAKE 1 TABLET BY MOUTH EVERY DAY Billie Ruddy, MD Taking  Active   Multiple Vitamins-Minerals (MULTIVITAMIN WITH MINERALS) tablet [id number] Yes Take 1 tablet by mouth every evening. [provider] Taking Active   omeprazole (PRILOSEC) 40 MG capsule [id number] Yes Take 1 capsule by mouth daily. [provider] Taking Active           Patient Active Problem List   Diagnosis Date Noted  . Paranoid schizophrenia ([place]) 10/16/2019  . CKD (chronic kidney disease), stage III ([place]) 08/29/2019  . OSA (obstructive sleep apnea) 04/29/2016  . GERD (gastroesophageal reflux disease) 10/28/2015  . Asthma, mild persistent 04/21/2015  . Type 2 diabetes  mellitus with stage 3a chronic kidney disease, without long-term current use of insulin ([place]) 04/19/2014  . Angina, [place] 12/30/2013  . Hypertension 01/10/2013  . Hyperlipidemia 01/10/2013  . Hypothyroidism 01/10/2013    Immunization History  Administered Date(s) Administered  . Influenza Inj Mdck Quad Pf 10/01/2020  . Influenza,inj,Quad PF,6+ Mos 10/21/2014, 10/28/2015, 11/04/2016, 02/14/2018, 09/28/2018  . PFIZER(Purple Top)SARS-COV-2 Vaccination 02/29/2020, 03/22/2020, 10/01/2020  . Pneumococcal Polysaccharide-23 10/21/2014, 03/21/2019  . Td 04/18/2016    Conditions to be addressed/monitored: HTN, HLD, hypothyroidism, asthma, T2DM  Care Plan : Pharmacy - Med Mgmt/Assistance  Updates made by Vella Raring, RPH-CPP since 04/26/2021 12:00 AM    Problem: Disease Progression     Long-Range Goal: Disease Progression Prevented or Minimized   Start Date: 04/24/2021  Expected End Date: 07/23/2021  This Visit's Progress: On track  Priority: High  Note:   Current Barriers:  . Chronic Disease Management support, education, and care coordination needs related to HTN, HLD, GERD, asthma, CKD and schizophrenia . Unable to independently afford treatment regimen o Reports difficulty with affording cost of Advair and albuterol inhalers and Ozempic through [place]):  Marland Kitchen Over the next 90 days, patient will verbalize ability to afford treatment regimen through collaboration with PharmD and provider.   Interventions: . 1:1 collaboration with Jearld Fenton, NP regarding development and update of comprehensive plan of care as evidenced by provider attestation and co-signature . Inter-disciplinary care team collaboration (see longitudinal plan of care) . Perform chart review. Patient seen by PCP on 5/19 to establish care. Note provider advised patient to: o Consider Ozempic for weight management o D/C Carvedilol o Start baby Aspirin . Comprehensive medication review performed; medication list updated in electronic medical record o Reports planning to pick up and restart aspirin 81 mg daily . Reports uses weekly pillbox to organize medications  Medication Assistance: . Reports difficulty with affording Advair and albuterol inhalers. Note each is currently a tier 3 option through her health plan o Based on reported income, patient does not meet income limit for [place] patient assistance program for Advair or patient assistance programs for therapeutic alternatives o Other assistance options evaluated: [place] (currently closed), [employer] (currently closed) o Review cost savings options for obtaining albuterol inhaler, including through [employer] . Reports interest in starting Ozempic as discussed with PCP if able to afford cost o Based on reported income, patient meets requirement for Ozempic patient assistance through [employer]. . Will collaborate with [facility] CPhT and PCP to assist patient with applying for [employer] patient assistance program  Asthma: . Patient followed by [facility] . Current treatment: o Advair 250-50 mcg/dose - 1 puff twice daily o Albuterol HFA as needed . Reports currently rationing both of her inhalers due to medication cost . Counsel patient  on rational for/encourage use of Advair maintenance inhaler twice daily and to rinse mouth out after use and of rescue inhaler (albuterol) as needed as directed  Seasonal Allergies: . Current treatment:  o Cetirizine 10 mg daily o Fluticasone nasal spray - 2 sprays per nostril daily as needed  Hypertension: . Controlled; current treatment: o Amlodipine 10 mg daily o Hydralazine 25 mg -  tablet (12.5 mg) twice daily in morning and at bedtime o Lisinopril 40 mg daily . Reports stopped carvedilol as discussed with PCP during recent office visit, to decrease pill burden . Reports has home upper arm monitor and checks home BP occasionally,  but denies keeping log of results . Recalls last checked BP on 5/20: 121/77 . Counsel patient to monitor home BP 1-2 days/week, keep log of results and bring record to medical appointments . Will mail patient a BP log and educational handout on BP monitoring as requested  T2DM: . Controlled; current treatment: none . Patient interested in starting Ozempic as discussed with PCP for weight loss benefit o Will assist with patient assistance (see above)  Hyperlipidemia: . Current treatment: o Atorvastatin 40 mg daily  Hypothyroidism: . Controlled; current treatment: levothyroxine 50 mcg daily before breakfast . Encourage patient to continue to take consistently with regard to separation of vitamins/minerals  Coordination of care: Marland Kitchen Reports followed by psychiatrist Dr. Adele Schilder for mental health o Reports planning to discuss haloperidol dose with provider at upcoming appointment on 6/1 . Reports interested in additional support for mental health/therapist o Will send referral to CCM Social Worker for mental health support resources  Patient Goals/Self-Care Activities . Over the next 90 days, patient will:  - take medications as prescribed - check blood pressure, document, and provide at future appointments  Follow Up Plan: Telephone follow up  appointment with care management team member scheduled for: 6/20 at 11:15 am     Patient's preferred pharmacy is:  [id number] [place], [place] - [address] [place] [address] Phone: [phone] Fax: [phone]  Uses pill box? Yes  Follow Up:  Patient agrees to Care Plan and Follow-up.  Harlow Asa, PharmD, [place], CPP Clinical Pharmacist [facility] [phone]

## 2021-04-27 ENCOUNTER — Other Ambulatory Visit: Payer: Self-pay | Admitting: *Deleted

## 2021-04-27 ENCOUNTER — Telehealth: Payer: Self-pay | Admitting: Allergy & Immunology

## 2021-04-27 DIAGNOSIS — R262 Difficulty in walking, not elsewhere classified: Secondary | ICD-10-CM | POA: Diagnosis not present

## 2021-04-27 DIAGNOSIS — M24571 Contracture, right ankle: Secondary | ICD-10-CM | POA: Diagnosis not present

## 2021-04-27 DIAGNOSIS — M7731 Calcaneal spur, right foot: Secondary | ICD-10-CM | POA: Diagnosis not present

## 2021-04-27 DIAGNOSIS — M722 Plantar fascial fibromatosis: Secondary | ICD-10-CM | POA: Diagnosis not present

## 2021-04-27 MED ORDER — ADVAIR DISKUS 250-50 MCG/ACT IN AEPB: 1.0000 | INHALATION_SPRAY | Freq: Every day | RESPIRATORY_TRACT | 0 refills | Status: DC

## 2021-04-27 NOTE — Chronic Care Management (AMB) (Signed)
  Chronic Care Management   Note  04/27/2021 Name: Maria Franklin MRN: [id number] DOB: August 21, 1961  Maria Franklin is a 60 y.o. year old female who is a primary care patient of Jearld Fenton, NP. I reached out to [name] by phone today in response to a referral sent by [name] PCP, Jearld Fenton, NP     Maria Franklin was given information about Chronic Care Management services today including:  1. CCM service includes personalized support from designated clinical staff supervised by her physician, including individualized plan of care and coordination with other care providers 2. 24/7 contact phone numbers for assistance for urgent and routine care needs. 3. Service will only be billed when office clinical staff spend 20 minutes or more in a month to coordinate care. 4. Only one practitioner may furnish and bill the service in a calendar month. 5. The patient may stop CCM services at any time (effective at the end of the month) by phone call to the office staff. 6. The patient will be responsible for cost sharing (co-pay) of up to 20% of the service fee (after annual deductible is met).  Patient agreed to services and verbal consent obtained.   Follow up plan: Telephone appointment with care management team member scheduled for:04/24/2021  Noreene Larsson, [place], [place], [place] [address] Direct Dial: [phone] [email or web address]: [email or web address]

## 2021-04-27 NOTE — Telephone Encounter (Signed)
Called patient and advised that the Advair was last sent in 04/14/2020. Sent in a courtesy refill of the Advair to the requested pharmacy. Advised to please keep her appointment so that we could sent in more refills at her visit. Patient verbalized understanding.

## 2021-04-27 NOTE — Patient Instructions (Signed)
Visit Information   PATIENT GOALS:  Goals Addressed            This Visit's Progress   . Pharmacy Goals       Our goal bad cholesterol, or LDL, is less than 70 . This is why it is important to continue taking your atorvastatin.  Please check your home blood pressure, keep a log of the results and bring this with you to your medical appointments.  Please watch the mail for patient assistance program paperwork. Please complete these forms and return to [place] using provided envelope.  Feel free to call me with any questions or concerns. I look forward to our next call!  Harlow Asa, PharmD, [place], CPP Clinical Pharmacist [place] [phone]       Consent to CCM Services: Maria Franklin was given information about Chronic Care Management services today including:  1. CCM service includes personalized support from designated clinical staff supervised by her physician, including individualized plan of care and coordination with other care providers 2. 24/7 contact phone numbers for assistance for urgent and routine care needs. 3. Service will only be billed when office clinical staff spend 20 minutes or more in a month to coordinate care. 4. Only one practitioner may furnish and bill the service in a calendar month. 5. The patient may stop CCM services at any time (effective at the end of the month) by phone call to the office staff. 6. The patient will be responsible for cost sharing (co-pay) of up to 20% of the service fee (after annual deductible is met).  Patient agreed to services and verbal consent obtained.   The patient verbalized understanding of instructions, educational materials, and care plan provided today and declined offer to receive copy of patient instructions, educational materials, and care plan.   Telephone follow up appointment with care management team member scheduled for: 6/20 at 11:15 am   CLINICAL CARE PLAN: Patient  Care Plan: Pharmacy - Med Mgmt/Assistance    Problem Identified: Disease Progression     Long-Range Goal: Disease Progression Prevented or Minimized   Start Date: 04/24/2021  Expected End Date: 07/23/2021  This Visit's Progress: On track  Priority: High  Note:   Current Barriers:  . Chronic Disease Management support, education, and care coordination needs related to HTN, HLD, GERD, asthma, CKD and schizophrenia . Unable to independently afford treatment regimen o Reports difficulty with affording cost of Advair and albuterol inhalers and Ozempic through [place]):  Marland Kitchen Over the next 90 days, patient will verbalize ability to afford treatment regimen through collaboration with PharmD and provider.   Interventions: . 1:1 collaboration with Jearld Fenton, NP regarding development and update of comprehensive plan of care as evidenced by provider attestation and co-signature . Inter-disciplinary care team collaboration (see longitudinal plan of care) . Perform chart review. Patient seen by PCP on 5/19 to establish care. Note provider advised patient to: o Consider Ozempic for weight management o D/C Carvedilol o Start baby Aspirin . Comprehensive medication review performed; medication list updated in electronic medical record o Reports planning to pick up and restart aspirin 81 mg daily . Reports uses weekly pillbox to organize medications  Medication Assistance: . Reports difficulty with affording Advair and albuterol inhalers. Note each is currently a tier 3 option through her health plan o Based on reported income, patient does not meet income limit for [place] patient assistance program for Advair or patient  assistance programs for therapeutic alternatives o Other assistance options evaluated: [place] (currently closed), [employer] (currently closed) o Review cost savings options for obtaining albuterol inhaler, including  through [employer] . Reports interest in starting Ozempic as discussed with PCP if able to afford cost o Based on reported income, patient meets requirement for Ozempic patient assistance through [employer]. . Will collaborate with [facility] CPhT and PCP to assist patient with applying for [employer] patient assistance program  Asthma: . Patient followed by [facility] [place] . Current treatment: o Advair 250-50 mcg/dose - 1 puff twice daily o Albuterol HFA as needed . Reports currently rationing both of her inhalers due to medication cost . Counsel patient on rational for/encourage use of Advair maintenance inhaler twice daily and to rinse mouth out after use and of rescue inhaler (albuterol) as needed as directed  Seasonal Allergies: . Current treatment:  o Cetirizine 10 mg daily o Fluticasone nasal spray - 2 sprays per nostril daily as needed  Hypertension: . Controlled; current treatment: o Amlodipine 10 mg daily o Hydralazine 25 mg -  tablet (12.5 mg) twice daily in morning and at bedtime o Lisinopril 40 mg daily . Reports stopped carvedilol as discussed with PCP during recent office visit, to decrease pill burden . Reports has home upper arm monitor and checks home BP occasionally, but denies keeping log of results . Recalls last checked BP on 5/20: 121/77 . Counsel patient to monitor home BP 1-2 days/week, keep log of results and bring record to medical appointments . Will mail patient a BP log and educational handout on BP monitoring as requested  T2DM: . Controlled; current treatment: none . Patient interested in starting Ozempic as discussed with PCP for weight loss benefit o Will assist with patient assistance (see above)  Hyperlipidemia: . Current treatment: o Atorvastatin 40 mg daily  Hypothyroidism: . Controlled; current treatment: levothyroxine 50 mcg daily before breakfast . Encourage patient to continue to take consistently with regard to  separation of vitamins/minerals  Coordination of care: Marland Kitchen Reports followed by psychiatrist Dr. Adele Schilder for mental health o Reports planning to discuss haloperidol dose with provider at upcoming appointment on 6/1 . Reports interested in additional support for mental health/therapist o Will send referral to CCM Social Worker for mental health support resources  Patient Goals/Self-Care Activities . Over the next 90 days, patient will:  - take medications as prescribed - check blood pressure, document, and provide at future appointments  Follow Up Plan: Telephone follow up appointment with care management team member scheduled for: 6/20 at 11:15 am

## 2021-04-27 NOTE — Telephone Encounter (Signed)
Patient called to check on a prescription for Advair [place], that she had requested. I told her it had been sent in on 04/14/21. I told her the pharmacy it was sent to and it was the wrong one. She said she does not go to the [facility] anymore. [facility] [address] [facility] [place].

## 2021-04-29 ENCOUNTER — Telehealth: Payer: Self-pay | Admitting: Licensed Clinical Social Worker

## 2021-04-29 ENCOUNTER — Ambulatory Visit: Payer: Self-pay | Admitting: Pharmacist

## 2021-04-29 ENCOUNTER — Other Ambulatory Visit: Payer: Self-pay | Admitting: *Deleted

## 2021-04-29 DIAGNOSIS — J4530 Mild persistent asthma, uncomplicated: Secondary | ICD-10-CM

## 2021-04-29 DIAGNOSIS — I10 Essential (primary) hypertension: Secondary | ICD-10-CM

## 2021-04-29 MED ORDER — ADVAIR DISKUS 250-50 MCG/ACT IN AEPB: 1.0000 | INHALATION_SPRAY | Freq: Two times a day (BID) | RESPIRATORY_TRACT | 0 refills | Status: DC

## 2021-04-29 NOTE — Telephone Encounter (Signed)
    Clinical Social Work  Care Management   Phone Outreach    04/29/2021 Name: Maria Franklin MRN: [id number] DOB: 10/10/1961  Maria Franklin is a 60 y.o. year old female who is a primary care patient of Garnette Gunner, Coralie Keens, NP .   CCM LCSW reached out to patient today by phone to introduce self, assess needs and offer Care Management services and interventions.    Telephone outreach was unsuccessful  Plan:Will route chart to Care Guide to see if patient would like to schedule phone appointment   Review of patient status, including review of consultants reports, relevant laboratory and other test results, and collaboration with appropriate care team members and the patient's provider was performed as part of comprehensive patient evaluation and provision of care management services.    Christa See, MSW, [facility] Care Management [email or web address] Phone [phone] 2:58 PM

## 2021-04-29 NOTE — Patient Instructions (Signed)
Visit Information  PATIENT GOALS: Goals Addressed            This Visit's Progress   . Pharmacy Goals       Our goal bad cholesterol, or LDL, is less than 70 . This is why it is important to continue taking your atorvastatin.  Please check your home blood pressure, keep a log of the results and bring this with you to your medical appointments.  Please watch the mail for patient assistance program paperwork. Please complete these forms and return to [place] using provided envelope.  Feel free to call me with any questions or concerns. I look forward to our next call!   Harlow Asa, PharmD, [place], CPP Clinical Pharmacist [facility] [phone]       The patient verbalized understanding of instructions, educational materials, and care plan provided today and declined offer to receive copy of patient instructions, educational materials, and care plan.   Telephone follow up appointment with care management team member scheduled for: 6/20 at 11:15 am

## 2021-04-29 NOTE — Chronic Care Management (AMB) (Signed)
Chronic Care Management Pharmacy Note  04/29/2021 Name:  Maria Franklin MRN:  [id number] DOB:  [birth year]  Subjective: Maria Franklin is an 60 y.o. year old female who is a primary patient of Jearld Fenton, NP.  The CCM team was consulted for assistance with disease management and care coordination needs.    Engaged with patient by telephone for follow up visit in response to provider referral for pharmacy case management and/or care coordination services.   Consent to Services:  The patient was given information about Chronic Care Management services, agreed to services, and gave verbal consent prior to initiation of services.  Please see initial visit note for detailed documentation.   Patient Care Team: Jearld Fenton, NP as PCP - General (Internal Medicine) Jettie Booze, MD as PCP - Cardiology (Cardiology) Ryelan Kazee, Virl Diamond, RPH-CPP (Pharmacist)  Recent office visits: None  Hospital visits: None in previous 6 months  Objective:  Lab Results  Component Value Date   CREATININE 1.20 (H) 04/23/2021   CREATININE 1.39 (H) 11/18/2020   CREATININE 1.14 (H) 07/28/2020    Lab Results  Component Value Date   HGBA1C 6.3 (H) 04/23/2021   Last diabetic Eye exam:  Lab Results  Component Value Date/Time   HMDIABEYEEXA No Retinopathy 08/03/2018 04:45 PM    Last diabetic Foot exam: No results found for: HMDIABFOOTEX      Component Value Date/Time   CHOL 170 04/23/2021 1055   TRIG 99 04/23/2021 1055   HDL 74 04/23/2021 1055   CHOLHDL 2.3 04/23/2021 1055   VLDL 18 10/17/2019 0630   LDLCALC 78 04/23/2021 1055   LDLDIRECT 97.1 01/14/2014 1224    Hepatic Function Latest Ref Rng & Units 04/23/2021 07/28/2020 10/17/2019  Total Protein 6.1 - 8.1 g/dL 6.9 6.9 7.8  Albumin 3.5 - 5.0 g/dL - - 3.8  AST 10 - 35 U/L $Remo'19 20 23  'gbMWh$ ALT 6 - 29 U/L $Remo'21 19 24  'FfyMj$ Alk Phosphatase 38 - 126 U/L - - 89  Total Bilirubin 0.2 - 1.2 mg/dL 0.3 0.3 0.6    Lab Results  Component Value  Date/Time   TSH 2.06 04/23/2021 10:55 AM   TSH 3.27 07/28/2020 09:42 AM   FREET4 1.1 04/23/2021 10:55 AM   FREET4 1.0 07/28/2020 09:42 AM   Clinical ASCVD: No  The 10-year ASCVD risk score Mikey Bussing DC Jr., et al., 2013) is: 8.1%   Values used to calculate the score:     Age: 59 years     Sex: Female     Is Non-Hispanic African American: Yes     Diabetic: Yes     Tobacco smoker: No     Systolic Blood Pressure: 546 mmHg     Is BP treated: Yes     HDL Cholesterol: 74 mg/dL     Total Cholesterol: 170 mg/dL    Other: (CHADS2VASc if Afib, PHQ9 if depression, MMRC or CAT for COPD, ACT, DEXA)  Social History   Tobacco Use  Smoking Status Former Smoker  . Packs/day: 0.25  . Years: 6.00  . Pack years: 1.50  Smokeless Tobacco Never Used  Tobacco Comment   QUIT IN HER EARLY 20'S   BP Readings from Last 3 Encounters:  04/23/21 117/66  01/23/21 119/80  12/17/20 112/76   Pulse Readings from Last 3 Encounters:  04/23/21 100  01/23/21 94  12/17/20 80   Wt Readings from Last 3 Encounters:  04/23/21 267 lb 6.4 oz (121.3 kg)  01/23/21 261 lb  6.4 oz (118.6 kg)  12/17/20 259 lb (117.5 kg)    Assessment: Review of patient past medical history, allergies, medications, health status, including review of consultants reports, laboratory and other test data, was performed as part of comprehensive evaluation and provision of chronic care management services.   SDOH:  (Social Determinants of Health) assessments and interventions performed:    CCM Care Plan  Allergies  Allergen Reactions  . Bee Pollen Cough  . Pollen Extract Cough  . Latex Itching, Rash and Other (See Comments)    Severe itching    Medications Reviewed Today    Reviewed by Vella Raring, RPH-CPP (Pharmacist) on 04/24/21 at [facility] List Status: <None>  Medication Order Taking? Sig Documenting Provider Last Dose Status Informant  albuterol (VENTOLIN HFA) 108 (90 Base) MCG/ACT inhaler [id number] Yes INHALE 1 TO 2  PUFFS INTO THE LUNGS EVERY 6 HOURS AS NEEDED WHEEZING/SHORTNESS OF Katy Apo, MD Taking Active   amLODipine (NORVASC) 10 MG tablet [id number] Yes TAKE 1 TABLET BY MOUTH EVERY DAY Billie Ruddy, MD Taking Active   aspirin EC 81 MG EC tablet [id number]  Take 1 tablet (81 mg total) by mouth daily.  Patient not taking: Reported on 04/23/2021   Hosie Poisson, MD  Active   atorvastatin (LIPITOR) 40 MG tablet [id number] Yes Take 1 tablet (40 mg total) by mouth daily at 6 PM. Billie Ruddy, MD Taking Active   cetirizine (ZYRTEC) 10 MG tablet [id number] Yes Take 10 mg by mouth daily. [provider] Taking Active   Ferrous Sulfate (IRON) 325 (65 Fe) MG TABS [id number] Yes Take 1 tablet by mouth every morning. [provider] Taking Active   fluticasone (FLONASE) 50 MCG/ACT nasal spray [id number] Yes Place 2 sprays into both nostrils 2 (two) times a day.  Patient taking differently: Place 2 sprays into both nostrils daily.   Valentina Shaggy, MD Taking Active   Fluticasone-Salmeterol (ADVAIR DISKUS) 250-50 Earlie Raveling [id number] Yes Inhale 1 puff into the lungs 2 (two) times daily.  Patient taking differently: Inhale 1 puff into the lungs daily.   Valentina Shaggy, MD Taking Active   haloperidol (HALDOL) 5 MG tablet [id number] Yes Take 0.5 tablets (2.5 mg total) by mouth at bedtime. Take 1/2 tab at bed time  Patient taking differently: Take 0.25 mg by mouth at bedtime. Take 1/2 tab at bed time   Arfeen, Arlyce Harman, MD Taking Active   hydrALAZINE (APRESOLINE) 25 MG tablet [id number] Yes Take 0.5 tablets (12.5 mg total) by mouth in the morning and at bedtime. Billie Ruddy, MD Taking Active   levothyroxine (SYNTHROID) 50 MCG tablet [id number] Yes TAKE 1 TABLET BY MOUTH EVERY DAY BEFORE BREAKFAST Billie Ruddy, MD Taking Active   lisinopril (ZESTRIL) 40 MG tablet [id number] Yes TAKE 1 TABLET BY MOUTH EVERY DAY Billie Ruddy, MD Taking Active   Multiple  Vitamins-Minerals (MULTIVITAMIN WITH MINERALS) tablet [id number] Yes Take 1 tablet by mouth every evening. [provider] Taking Active   omeprazole (PRILOSEC) 40 MG capsule [id number] Yes Take 1 capsule by mouth daily. [provider] Taking Active           Patient Active Problem List   Diagnosis Date Noted  . Paranoid schizophrenia ([place]) 10/16/2019  . CKD (chronic kidney disease), stage III ([place]) 08/29/2019  . OSA (obstructive sleep apnea) 04/29/2016  . GERD (gastroesophageal reflux disease) 10/28/2015  . Asthma, mild persistent 04/21/2015  . Type 2 diabetes mellitus with  stage 3a chronic kidney disease, without long-term current use of insulin ([place]) 04/19/2014  . Angina, Ludwig 12/30/2013  . Hypertension 01/10/2013  . Hyperlipidemia 01/10/2013  . Hypothyroidism 01/10/2013    Immunization History  Administered Date(s) Administered  . Influenza Inj Mdck Quad Pf 10/01/2020  . Influenza,inj,Quad PF,6+ Mos 10/21/2014, 10/28/2015, 11/04/2016, 02/14/2018, 09/28/2018  . PFIZER(Purple Top)SARS-COV-2 Vaccination 02/29/2020, 03/22/2020, 10/01/2020  . Pneumococcal Polysaccharide-23 10/21/2014, 03/21/2019  . Td 04/18/2016    Conditions to be addressed/monitored: HTN, HLD, hypothyroidism, asthma, T2DM  Care Plan : Pharmacy - Med Mgmt/Assistance  Updates made by Vella Raring, RPH-CPP since 04/29/2021 12:00 AM    Problem: Disease Progression     Long-Range Goal: Disease Progression Prevented or Minimized   Start Date: 04/24/2021  Expected End Date: 07/23/2021  This Visit's Progress: On track  Recent Progress: On track  Priority: High  Note:   Current Barriers:  . Chronic Disease Management support, education, and care coordination needs related to HTN, HLD, GERD, asthma, CKD and schizophrenia . Unable to independently afford treatment regimen o Reports difficulty with affording cost of Advair and albuterol inhalers and Ozempic through [place]):  Marland Kitchen Over the next 90 days, patient will verbalize ability to afford treatment regimen through collaboration with PharmD and provider.   Interventions: . 1:1 collaboration with Jearld Fenton, NP regarding development and update of comprehensive plan of care as evidenced by provider attestation and co-signature . Inter-disciplinary care team collaboration (see longitudinal plan of care) . Receive call from patient today requesting call back regarding cost of Advair inhaler . Reports seen by Podiatrist yesterday and prescribed 6-day course of Medrol (started taking today)  Medication Assistance: . Return call to patient. Reports picked up her Advair inhaler yesterday, but cost was increased  . Place [facility] and [facility] with [facility]. Identify patient charged $94, rather than $47 copayment as Rx billed for 60 day supply. Note direction sent in by [facility] on 5/23 indicated patient to use inhaler only ONCE daily, rather than TWICE daily o RPh Melany agrees pharmacy will rebill Advair Rx to updated direction/day supply/copayment if new Rx sent by original prescriber . Place coordination of care call to [facility] and [place]. Receive call back from [place], [place] o Updated Rx to reflect patient using Advair inhaler twice daily [facility] to [facility] o Follow up with patient. Advise patient to follow up with pharmacy for rebilling of Rx . Continue to collaborate with [place] for assistance to patient with applying for [employer] patient assistance program  Asthma: . Patient followed by [facility] and [place] . Current treatment: o Advair 250-50 mcg/dose - 1 puff twice daily o Albuterol HFA as needed . Counsel patient on rational for/encourage use of Advair maintenance inhaler twice daily and to rinse mouth out after use and of rescue inhaler (albuterol) as needed as  directed  Hypertension: . Controlled; current treatment: o Amlodipine 10 mg daily o Hydralazine 25 mg -  tablet (12.5 mg) twice daily in morning and at bedtime o Lisinopril 40 mg daily o Carvedilol 12.5 mg twice daily (self-restarted today) . Reports restarted checking home BP and keeping log o Yesterday: 148/81, HR 105 o Today: 153/83, HR 111 . Reports self-restarted taking carvedilol today as has noticed home BP results elevated since stopped taking o Note previously stopped carvedilol as discussed with PCP during recent office visit to decrease pill burden .  Encourage patient to continue to monitor home BP, keep log of results and bring record to medical appointments . Have mailed patient a BP log and educational handout on BP monitoring as requested   Patient Goals/Self-Care Activities . Over the next 90 days, patient will:  - take medications as prescribed - check blood pressure, document, and provide at future appointments  Follow Up Plan: Telephone follow up appointment with care management team member scheduled for: 6/20 at 11:15 am     Medication Assistance: Application for Ozempic  medication assistance program. in process.    [id number] [place], [address] [place] [address] Phone: [phone] Fax: [phone]  Uses pill box? Yes  Follow Up:  Patient agrees to Care Plan and Follow-up.  Harlow Asa, PharmD, [place], CPP Clinical Pharmacist [facility] [phone]

## 2021-05-05 ENCOUNTER — Encounter: Payer: Self-pay | Admitting: Allergy & Immunology

## 2021-05-05 ENCOUNTER — Other Ambulatory Visit: Payer: Self-pay

## 2021-05-05 ENCOUNTER — Ambulatory Visit (INDEPENDENT_AMBULATORY_CARE_PROVIDER_SITE_OTHER): Payer: Medicare HMO | Admitting: Allergy & Immunology

## 2021-05-05 ENCOUNTER — Telehealth: Payer: Self-pay | Admitting: Pharmacy Technician

## 2021-05-05 VITALS — BP 124/80 | HR 82 | Temp 97.9°F | Resp 18 | Ht 63.0 in | Wt 264.4 lb

## 2021-05-05 DIAGNOSIS — J302 Other seasonal allergic rhinitis: Secondary | ICD-10-CM

## 2021-05-05 DIAGNOSIS — J339 Nasal polyp, unspecified: Secondary | ICD-10-CM

## 2021-05-05 DIAGNOSIS — Z596 Low income: Secondary | ICD-10-CM

## 2021-05-05 DIAGNOSIS — J3089 Other allergic rhinitis: Secondary | ICD-10-CM | POA: Diagnosis not present

## 2021-05-05 DIAGNOSIS — J4540 Moderate persistent asthma, uncomplicated: Secondary | ICD-10-CM | POA: Diagnosis not present

## 2021-05-05 MED ORDER — CETIRIZINE HCL 10 MG PO TABS: 10.0000 mg | ORAL_TABLET | Freq: Every day | ORAL | 5 refills | Status: DC

## 2021-05-05 MED ORDER — LORATADINE 10 MG PO TABS: 10.0000 mg | ORAL_TABLET | Freq: Every day | ORAL | 3 refills | Status: DC

## 2021-05-05 MED ORDER — ALBUTEROL SULFATE HFA 108 (90 BASE) MCG/ACT IN AERS: INHALATION_SPRAY | RESPIRATORY_TRACT | 1 refills | Status: DC

## 2021-05-05 MED ORDER — FLUTICASONE PROPIONATE 50 MCG/ACT NA SUSP: 2.0000 | Freq: Every day | NASAL | 5 refills | Status: DC

## 2021-05-05 MED ORDER — ADVAIR DISKUS 250-50 MCG/ACT IN AEPB: 1.0000 | INHALATION_SPRAY | Freq: Two times a day (BID) | RESPIRATORY_TRACT | 5 refills | Status: DC

## 2021-05-05 NOTE — Progress Notes (Signed)
[facility])                                            [place] Team    05/05/2021  JONTAE SONIER Jul 29, 1961 [id number]                                      Medication Assistance Referral  Referral From: [facility] Embedded RPh Dorthula Perfect   Medication/Company: Larna Daughters / [employer] Patient application portion:  Education officer, museum portion: Faxed  to Webb Silversmith, FNP Provider address/fax verified via: [employer]. Allex [place], [place]  [phone]

## 2021-05-05 NOTE — Patient Instructions (Addendum)
1. Seasonal and perennial allergic rhinitis - Continue with the Flonase two sprays per nostril daily.  - Continue with Claritin one tablet daily.  2. Moderate persistent asthma - not well controlled - Lung testing looked great today. - We are not going to make any changes today.  - Daily controller medication(s): Advair 250/50 one puff once daily - Prior to physical activity: albuterol 2 puffs 10-15 minutes before physical activity. - Rescue medications: albuterol 4 puffs every 4-6 hours as needed or albuterol nebulizer one vial every 4-6 hours as needed - Asthma control goals:  * Full participation in all desired activities (may need albuterol before activity) * Albuterol use two time or less a week on average (not counting use with activity) * Cough interfering with sleep two time or less a month * Oral steroids no more than once a year * No hospitalizations  3. Nasal polyposis - Continue with your current management.  - Continue to follow with ENT.  - Consider start [place] in the future again (information provided).  4. Return in about 6 months (around 11/04/2021).    Please inform us of any Emergency Department visits, hospitalizations, or changes in symptoms. Call us before going to the ED for breathing or allergy symptoms since we might be able to fit you in for a sick visit. Feel free to contact us anytime with any questions, problems, or concerns.  It was a pleasure to talk to you today today!  Websites that have reliable patient information: 1. American Academy of Asthma, Allergy, and Immunology: www.aaaai.org 2. Food Allergy Research and Education (FARE): foodallergy.org 3. Mothers of Asthmatics: http://www.asthmacommunitynetwork.org 4. American College of Allergy, Asthma, and Immunology: www.acaai.org   COVID-19 Vaccine Information can be found at: ShippingScam.co.uk For questions related to vaccine  distribution or appointments, please email vaccine@[email or web address] .[email or web address] or call [phone].     "Like" [place] on Facebook and Instagram for our latest updates!        Make sure you are registered to vote! If you have moved or changed any of your contact information, you will need to get this updated before voting!  In some cases, you MAY be able to register to vote online: [email or web address]

## 2021-05-05 NOTE — Progress Notes (Signed)
FOLLOW UP  Date of Service/Encounter:  05/05/21   Assessment:   Seasonal and perennial allergic rhinitis(grasses, weeds, trees, indoor and outdoor molds, dust mite, roach)  Moderate persistent asthma without complication  Adverse reaction to Dupixent- unclear causation  Nasal polyposis- s/pthreesinus surgeries (1990s, 2013, 2020)  Adverse food reactions (nuts, mushrooms) - with negative testing the past  Pruritus- controlledwith emollients alone  Difficulty affording medications - not interested in trying another biologic (which ironically would be more affordable for her)  Fully vaccinated to COVID-19  Plan/Recommendations:   1. Seasonal and perennial allergic rhinitis - Continue with the Flonase two sprays per nostril daily.  - Continue with Claritin one tablet daily.  2. Moderate persistent asthma - much better well controlled today - Lung testing looked great today. - We are not going to make any changes today.  - Daily controller medication(s): Advair 250/50 one puff once daily - Prior to physical activity: albuterol 2 puffs 10-15 minutes before physical activity. - Rescue medications: albuterol 4 puffs every 4-6 hours as needed or albuterol nebulizer one vial every 4-6 hours as needed - Asthma control goals:  * Full participation in all desired activities (may need albuterol before activity) * Albuterol use two time or less a week on average (not counting use with activity) * Cough interfering with sleep two time or less a month * Oral steroids no more than once a year * No hospitalizations  3. Nasal polyposis - Continue with your current management.  - Continue to follow with ENT.  - Consider start [place] in the future again (information provided).  4. Return in about 6 months (around 11/04/2021).    Subjective:   Maria Franklin is a 60 y.o. female presenting today for follow up of  Chief Complaint  Patient presents with  . Asthma     Maria Franklin has a history of the following: Patient Active Problem List   Diagnosis Date Noted  . Paranoid schizophrenia ([place]) 10/16/2019  . CKD (chronic kidney disease), stage III ([place]) 08/29/2019  . OSA (obstructive sleep apnea) 04/29/2016  . GERD (gastroesophageal reflux disease) 10/28/2015  . Asthma, mild persistent 04/21/2015  . Type 2 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 disease, without long-term current use of insulin ([place]) 04/19/2014  . Angina, Ludwig 12/30/2013  . Hypertension 01/10/2013  . Hyperlipidemia 01/10/2013  . Hypothyroidism 01/10/2013    History obtained from: chart review and patient.  Maria Franklin is a 60 y.o. female presenting for a follow up visit. She was last seen in September 2021. At that time, we continued with fluticasone one spray per nostril daily as well as loratadine one tablet daily. For her asthma, she was continued on Advair 261mcg one puff once daily as well as albuterol as needed. Her nasal polyps were under fair control with her ENT.   Since the last visit, she has mostly done well.   Asthma/Respiratory Symptom History: Her breathing is under good control. She remains on her Advair and she uses it one puff once daily. Her copays are more manageable especially when she is able to stretch it out to once daily. This makes her copay last for two months. She is overall sleeping well at night. She is waking up at night, but this is related to hot flashes rather than her breathing. She is not avoiding doing anything that she enjoys because of her breathing.   Allergic Rhinitis Symptom History: She has been having a lot of itchy watery eyes. She  has been using some lubricating drops. She has these eye issues throughout the year. This is more watery rather than itchy. She has issues with staining over her entire face as if she had not washed her face for the day. She thinks that she needs some wipes to keep in her back to keep her eyes clean.     She continues to have issues with nasal polyps. She reports that she is constantly having issues with smelling. She is smelling random scents, including a "nursing home". She sees Dr. Remer Macho. She has had 2 or 3 polypectomies. She was on Dupixent for a short period of time. She did not tolerate it at all.   Otherwise, there have been no changes to her past medical history, surgical history, family history, or social history.    Review of Systems  Constitutional: Negative.  Negative for chills, malaise/fatigue and weight loss.  HENT: Negative.  Negative for congestion, ear discharge and ear pain.   Eyes: Negative for pain, discharge and redness.  Respiratory: Negative for cough, sputum production, shortness of breath and wheezing.   Cardiovascular: Negative.  Negative for chest pain and palpitations.  Gastrointestinal: Negative for abdominal pain, constipation, diarrhea, heartburn, nausea and vomiting.  Skin: Negative.  Negative for itching and rash.  Neurological: Negative for dizziness and headaches.  Endo/Heme/Allergies: Negative for environmental allergies. Does not bruise/bleed easily.       Objective:   Blood pressure 124/80, pulse 82, temperature 97.9 F (36.6 C), resp. rate 18, height 5\' 3"  (1.6 m), weight 264 lb 6.4 oz (119.9 kg), last menstrual period 02/02/2017, SpO2 97 %. Body mass index is 46.84 kg/m.   Physical Exam:  Physical Exam Vitals reviewed.  Constitutional:      Appearance: She is well-developed.  HENT:     Head: Normocephalic and atraumatic.     Right Ear: Tympanic membrane, ear canal and external ear normal.     Left Ear: Tympanic membrane, ear canal and external ear normal.     Nose: No nasal deformity, septal deviation, mucosal edema or rhinorrhea.     Right Turbinates: Enlarged, swollen and pale.     Left Turbinates: Enlarged, swollen and pale.     Right Sinus: No maxillary sinus tenderness or frontal sinus tenderness.     Left Sinus: No  maxillary sinus tenderness or frontal sinus tenderness.     Comments: Left turbinates are too enlarged to see anything. There is a polyp within the right nares.     Mouth/Throat:     Mouth: Mucous membranes are not pale and not dry.     Pharynx: Uvula midline.  Eyes:     General:        Right eye: No discharge.        Left eye: No discharge.     Conjunctiva/sclera: Conjunctivae normal.     Right eye: Right conjunctiva is not injected. No chemosis.    Left eye: Left conjunctiva is not injected. No chemosis.    Pupils: Pupils are equal, round, and reactive to light.  Cardiovascular:     Rate and Rhythm: Normal rate and regular rhythm.     Heart sounds: Normal heart sounds.  Pulmonary:     Effort: Pulmonary effort is normal. No tachypnea, accessory muscle usage or respiratory distress.     Breath sounds: Normal breath sounds. No wheezing, rhonchi or rales.     Comments: Moving air well in all lung fields.  No increased work of breathing. Chest:  Chest wall: No tenderness.  Lymphadenopathy:     Cervical: No cervical adenopathy.  Skin:    General: Skin is warm.     Capillary Refill: Capillary refill takes less than 2 seconds.     Coloration: Skin is not pale.     Findings: No abrasion, erythema, petechiae or rash. Rash is not papular, urticarial or vesicular.     Comments: No eczematous or urticarial lesions noted.  Neurological:     Mental Status: She is alert.  Psychiatric:        Behavior: Behavior is cooperative.      Diagnostic studies:   Spirometry: results normal (FEV1: 1.54/76%, FVC: 2.21/86%, FEV1/FVC: 70%).    Spirometry consistent with normal pattern.   Allergy Studies: none      Salvatore Marvel, MD  [facility] [place] [facility] [place]

## 2021-05-06 ENCOUNTER — Encounter (HOSPITAL_COMMUNITY): Payer: Self-pay | Admitting: Psychiatry

## 2021-05-06 ENCOUNTER — Telehealth (INDEPENDENT_AMBULATORY_CARE_PROVIDER_SITE_OTHER): Payer: Medicare HMO | Admitting: Psychiatry

## 2021-05-06 DIAGNOSIS — R69 Illness, unspecified: Secondary | ICD-10-CM | POA: Diagnosis not present

## 2021-05-06 DIAGNOSIS — F200 Paranoid schizophrenia: Secondary | ICD-10-CM

## 2021-05-06 MED ORDER — HALOPERIDOL 5 MG PO TABS: 2.5000 mg | ORAL_TABLET | Freq: Every day | ORAL | 2 refills | Status: DC

## 2021-05-06 NOTE — Progress Notes (Signed)
Virtual Visit via Video Note  I connected with Maria Franklin on 05/06/21 at  3:00 PM EDT by a video enabled telemedicine application and verified that I am speaking with the correct person using two identifiers.  Location: Patient: Home Provider: Home Office   I discussed the limitations of evaluation and management by telemedicine and the availability of in person appointments. The patient expressed understanding and agreed to proceed.  History of Present Illness: Patient is evaluated by video session.  She had missed he times her medication and cut down further to take only one fourth of the Haldol 5 mg.  She admitted to the past few weeks started to have paranoia, irritability, mood swings and poor sleep.  She had a history of poor compliance with medication and she usually cut down the medicine when she feels better.  She feels since her son moved in 4 weeks ago she noticed worsening of symptoms but she also admitted that she was not taking the haloperidol dose that was prescribed.  She also admitted there are nights when she missed taking the medication completely.  She sleeps not very good because she has a lot of neuropathy and pain.  She is using stocking to help the pain but she was told may require surgery for plantar fasciitis.  She denies any suicidal thoughts.  She lives with her husband who is supportive but recently he had been admitted because of high blood pressure but now stable.  Since she noticed her symptoms are getting worse she is back on Haloperidol 2.5 when she noticed improvement in recent days.  She is less paranoid and less irritable.  She promised to keep the medication as prescribed.  She has no tremors shakes or any EPS.  Her appetite is okay.  Her energy level is fair.   Psychiatric History:Reviewed. H/Oparanoia and delusions.[facility].H/O overdose and inpatientat [facility].Readmitted in 4 months due  tononcompliant with medication.Last admission at [facility] in Nov 2020.Abilifyworked butcould not afford. Noh/o abuse.  Recent Results (from the past 2160 hour(s))  CBC     Status: Abnormal   Collection Time: 04/23/21 10:55 AM  Result Value Ref Range   WBC 8.0 3.8 - 10.8 Thousand/uL   RBC 4.03 3.80 - 5.10 Million/uL   Hemoglobin 10.8 (L) 11.7 - 15.5 g/dL   HCT 35.5 35.0 - 45.0 %   MCV 88.1 80.0 - 100.0 fL   MCH 26.8 (L) 27.0 - 33.0 pg   MCHC 30.4 (L) 32.0 - 36.0 g/dL   RDW 12.9 11.0 - 15.0 %   Platelets 256 140 - 400 Thousand/uL   MPV 9.2 7.5 - 12.5 fL  Comprehensive metabolic panel     Status: Abnormal   Collection Time: 04/23/21 10:55 AM  Result Value Ref Range   Glucose, Bld 132 (H) 65 - 99 mg/dL    Comment: .            Fasting reference interval . For someone without known diabetes, a glucose value >125 mg/dL indicates that they may have diabetes and this should be confirmed with a follow-up test. .    BUN 20 7 - 25 mg/dL   Creat 1.20 (H) 0.50 - 1.05 mg/dL    Comment: For patients >63 years of age, the reference limit for Creatinine is approximately 13% higher for people identified as African-American. .    BUN/Creatinine Ratio 17 6 - 22 (calc)   Sodium 140 135 - 146 mmol/L  Potassium 4.3 3.5 - 5.3 mmol/L   Chloride 105 98 - 110 mmol/L   CO2 27 20 - 32 mmol/L   Calcium 9.3 8.6 - 10.4 mg/dL   Total Protein 6.9 6.1 - 8.1 g/dL   Albumin 4.1 3.6 - 5.1 g/dL   Globulin 2.8 1.9 - 3.7 g/dL (calc)   AG Ratio 1.5 1.0 - 2.5 (calc)   Total Bilirubin 0.3 0.2 - 1.2 mg/dL   Alkaline phosphatase (APISO) 87 37 - 153 U/L   AST 19 10 - 35 U/L   ALT 21 6 - 29 U/L  TSH     Status: None   Collection Time: 04/23/21 10:55 AM  Result Value Ref Range   TSH 2.06 0.40 - 4.50 mIU/L  T4, free     Status: None   Collection Time: 04/23/21 10:55 AM  Result Value Ref Range   Free T4 1.1 0.8 - 1.8 ng/dL  Lipid panel     Status: None   Collection Time: 04/23/21 10:55 AM   Result Value Ref Range   Cholesterol 170 <200 mg/dL   HDL 74 > OR = 50 mg/dL   Triglycerides 99 <150 mg/dL   LDL Cholesterol (Calc) 78 mg/dL (calc)    Comment: Reference range: <100 . Desirable range <100 mg/dL for primary prevention;   <70 mg/dL for patients with CHD or diabetic patients  with > or = 2 CHD risk factors. Marland Kitchen LDL-C is now calculated using the Martin-Hopkins  calculation, which is a validated novel method providing  better accuracy than the Friedewald equation in the  estimation of LDL-C.  Cresenciano Genre et al. Annamaria Helling. 0947;096(28): 2061-2068  (http://education.[facility].com/faq/FAQ164)    Total CHOL/HDL Ratio 2.3 <5.0 (calc)   Non-HDL Cholesterol (Calc) 96 <130 mg/dL (calc)    Comment: For patients with diabetes plus 1 major ASCVD risk  factor, treating to a non-HDL-C goal of <100 mg/dL  (LDL-C of <70 mg/dL) is considered a therapeutic  option.   Hemoglobin A1c     Status: Abnormal   Collection Time: 04/23/21 10:55 AM  Result Value Ref Range   Hgb A1c MFr Bld 6.3 (H) <5.7 % of total Hgb    Comment: For someone without known diabetes, a hemoglobin  A1c value between 5.7% and 6.4% is consistent with prediabetes and should be confirmed with a  follow-up test. . For someone with known diabetes, a value <7% indicates that their diabetes is well controlled. A1c targets should be individualized based on duration of diabetes, age, comorbid conditions, and other considerations. . This assay result is consistent with an increased risk of diabetes. . Currently, no consensus exists regarding use of hemoglobin A1c for diagnosis of diabetes for children. .    Mean Plasma Glucose 134 mg/dL   eAG (mmol/L) 7.4 mmol/L  Uric acid     Status: None   Collection Time: 04/23/21 10:55 AM  Result Value Ref Range   Uric Acid, Serum 6.5 2.5 - 7.0 mg/dL    Comment: Therapeutic target for gout patients: <6.0 mg/dL .     Psychiatric Specialty Exam: Physical Exam   Review of Systems  Weight 264 lb (119.7 kg), last menstrual period 02/02/2017.Body mass index is 46.77 kg/m.  General Appearance: Casual  Eye Contact:  Fair  Speech:  Slow  Volume:  Decreased  Mood:  Anxious and Dysphoric  Affect:  Congruent  Thought Process:  Goal Directed  Orientation:  Full (Time, Place, and Person)  Thought Content:  Paranoid Ideation and Rumination  Suicidal  Thoughts:  No  Homicidal Thoughts:  No  Memory:  Immediate;   Good Recent;   Fair Remote;   Fair  Judgement:  Fair  Insight:  Shallow  Psychomotor Activity:  Decreased  Concentration:  Concentration: Fair and Attention Span: Fair  Recall:  [employer] of Knowledge:  Fair  Language:  Good  Akathisia:  No  Handed:  Right  AIMS (if indicated):     Assets:  Communication Skills Desire for Improvement Housing Social Support  ADL's:  Intact  Cognition:  WNL  Sleep:   fair      Assessment and Plan: Schizophrenia chronic paranoid type.  I reviewed blood work results which was done 10 days ago.  Her hemoglobin A1c is 6.3 which is stable.  Discussed risk of noncompliance with medication may cause decompensation.  She agreed to keep her haloperidol 2.5 mg and we discussed therapy and she is willing to see a therapist again.  She also told to contact her PCP if they can arrange therapy appointment earlier.  Patient agreed with the plan.  I recommend to call us back if she has any question, concern if she feels worsening of the symptoms.  She like to keep the appointment in 6 months.  Follow Up Instructions:    I discussed the assessment and treatment plan with the patient. The patient was provided an opportunity to ask questions and all were answered. The patient agreed with the plan and demonstrated an understanding of the instructions.   The patient was advised to call back or seek an in-person evaluation if the symptoms worsen or if the condition fails to improve as anticipated.  I provided 17  minutes of non-face-to-face time during this encounter.   Kathlee Nations, MD

## 2021-05-07 NOTE — Telephone Encounter (Signed)
Patient states that she has connected to a therapist and does not want to schedule with LCSW at this time will call if needs anything   Thank you, [employer]

## 2021-05-13 ENCOUNTER — Ambulatory Visit (INDEPENDENT_AMBULATORY_CARE_PROVIDER_SITE_OTHER): Payer: Medicare HMO | Admitting: Clinical

## 2021-05-13 ENCOUNTER — Other Ambulatory Visit: Payer: Self-pay

## 2021-05-13 DIAGNOSIS — F200 Paranoid schizophrenia: Secondary | ICD-10-CM | POA: Diagnosis not present

## 2021-05-13 DIAGNOSIS — R69 Illness, unspecified: Secondary | ICD-10-CM | POA: Diagnosis not present
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
## 2021-05-14 DIAGNOSIS — M792 Neuralgia and neuritis, unspecified: Secondary | ICD-10-CM | POA: Diagnosis not present

## 2021-05-14 DIAGNOSIS — M7731 Calcaneal spur, right foot: Secondary | ICD-10-CM | POA: Diagnosis not present

## 2021-05-14 DIAGNOSIS — R609 Edema, unspecified: Secondary | ICD-10-CM | POA: Diagnosis not present

## 2021-05-14 DIAGNOSIS — M722 Plantar fascial fibromatosis: Secondary | ICD-10-CM | POA: Diagnosis not present

## 2021-05-14 DIAGNOSIS — M7661 Achilles tendinitis, right leg: Secondary | ICD-10-CM | POA: Diagnosis not present

## 2021-05-18 ENCOUNTER — Telehealth: Payer: Self-pay | Admitting: Pharmacy Technician

## 2021-05-18 DIAGNOSIS — Z596 Low income: Secondary | ICD-10-CM

## 2021-05-18 NOTE — Progress Notes (Signed)
[facility])                                            [place] Team    05/18/2021  Maria Franklin Feb 18, 1961 [id number]  Received both patient and provider portion(s) of patient assistance application(s) for Ozempic. Faxed completed application and required documents into [employer].    [name], [place]  [phone]

## 2021-05-20 ENCOUNTER — Telehealth: Payer: Self-pay | Admitting: Pharmacy Technician

## 2021-05-20 DIAGNOSIS — Z596 Low income: Secondary | ICD-10-CM

## 2021-05-20 NOTE — Progress Notes (Signed)
[facility])                                            [place] Team    05/20/2021  Maria Franklin November 04, 1961 [id number]  Care coordination call placed to [place] in regards to patient's Ozempic application.  Spoke to [place] who informed patient was APPROVED 05/20/21-12/05/21. She informed 5 boxes would be delivered to the provider's office in the next 10-15 business days.  [name] P. Amato Se[place]ano, [place]  [phone]

## 2021-05-25 ENCOUNTER — Ambulatory Visit (INDEPENDENT_AMBULATORY_CARE_PROVIDER_SITE_OTHER): Payer: Medicare HMO | Admitting: Pharmacist

## 2021-05-25 ENCOUNTER — Ambulatory Visit: Payer: Self-pay | Admitting: Licensed Clinical Social Worker

## 2021-05-25 DIAGNOSIS — F200 Paranoid schizophrenia: Secondary | ICD-10-CM

## 2021-05-25 DIAGNOSIS — I10 Essential (primary) hypertension: Secondary | ICD-10-CM

## 2021-05-25 DIAGNOSIS — E039 Hypothyroidism, unspecified: Secondary | ICD-10-CM | POA: Diagnosis not present

## 2021-05-25 DIAGNOSIS — E119 Type 2 diabetes mellitus without complications: Secondary | ICD-10-CM

## 2021-05-25 NOTE — Chronic Care Management (AMB) (Signed)
Chronic Care Management Pharmacy Note  05/25/2021 Name:  Maria Franklin MRN:  [id number] DOB:  1961/08/02  Subjective: Maria Franklin is an 60 y.o. year old female who is a primary patient of Jearld Fenton, NP.  The CCM team was consulted for assistance with disease management and care coordination needs.    Engaged with patient by telephone for follow up visit in response to provider referral for pharmacy case management and/or care coordination services.   Consent to Services:  The patient was given information about Chronic Care Management services, agreed to services, and gave verbal consent prior to initiation of services.  Please see initial visit note for detailed documentation.   Patient Care Team: Jearld Fenton, NP as PCP - General (Internal Medicine) Jettie Booze, MD as PCP - Cardiology (Cardiology) Darius Lundberg, Virl Diamond, RPH-CPP (Pharmacist)   Recent consult visits: Office Visit with Allergy and [place] on 5/31 Video Visit with Psychiatrist on 6/1  Hospital visits: None in previous 6 months  Objective:  Lab Results  Component Value Date   CREATININE 1.20 (H) 04/23/2021   CREATININE 1.39 (H) 11/18/2020   CREATININE 1.14 (H) 07/28/2020    Lab Results  Component Value Date   HGBA1C 6.3 (H) 04/23/2021   Last diabetic Eye exam:  Lab Results  Component Value Date/Time   HMDIABEYEEXA No Retinopathy 09/09/2020 12:00 AM    Last diabetic Foot exam: No results found for: HMDIABFOOTEX      Component Value Date/Time   CHOL 170 04/23/2021 1055   TRIG 99 04/23/2021 1055   HDL 74 04/23/2021 1055   CHOLHDL 2.3 04/23/2021 1055   VLDL 18 10/17/2019 0630   LDLCALC 78 04/23/2021 1055   LDLDIRECT 97.1 01/14/2014 1224    Hepatic Function Latest Ref Rng & Units 04/23/2021 07/28/2020 10/17/2019  Total Protein 6.1 - 8.1 g/dL 6.9 6.9 7.8  Albumin 3.5 - 5.0 g/dL - - 3.8  AST 10 - 35 U/L _0 ALT 6 - 29 U/L _1 Alk Phosphatase 38 -  126 U/L - - 89  Total Bilirubin 0.2 - 1.2 mg/dL 0.3 0.3 0.6    Lab Results  Component Value Date/Time   TSH 2.06 04/23/2021 10:55 AM   TSH 3.27 07/28/2020 09:42 AM   FREET4 1.1 04/23/2021 10:55 AM   FREET4 1.0 07/28/2020 09:42 AM   Clinical ASCVD: No  The 10-year ASCVD risk score Mikey Bussing DC Jr., et al., 2013) is: 9.6%   Values used to calculate the score:     Age: 53 years     Sex: Female     Is Non-Hispanic African American: Yes     Diabetic: Yes     Tobacco smoker: No     Systolic Blood Pressure: 150 mmHg     Is BP treated: Yes     HDL Cholesterol: 74 mg/dL     Total Cholesterol: 170 mg/dL    Social History   Tobacco Use  Smoking Status Former   Packs/day: 0.25   Years: 6.00   Pack years: 1.50   Types: Cigarettes  Smokeless Tobacco Never  Tobacco Comments   QUIT IN HER EARLY 20'S   BP Readings from Last 3 Encounters:  05/05/21 124/80  04/23/21 117/66  01/23/21 119/80   Pulse Readings from Last 3 Encounters:  05/05/21 82  04/23/21 100  01/23/21 94   Wt Readings from Last 3 Encounters:  05/06/21 264 lb (119.7 kg)  05/05/21 264  lb 6.4 oz (119.9 kg)  04/23/21 267 lb 6.4 oz (121.3 kg)    Assessment: Review of patient past medical history, allergies, medications, health status, including review of consultants reports, laboratory and other test data, was performed as part of comprehensive evaluation and provision of chronic care management services.   SDOH:  (Social Determinants of Health) assessments and interventions performed: none   CCM Care Plan  Allergies  Allergen Reactions   Bee Pollen Cough   Pollen Extract Cough   Latex Itching, Rash and Other (See Comments)    Severe itching    Medications Reviewed Today     Reviewed by Vella Raring, RPH-CPP (Pharmacist) on 05/25/21 at 33  Med List Status: <None>   Medication Order Taking? Sig Documenting Provider Last Dose Status Informant  ADVAIR DISKUS 250-50 MCG/ACT AEPB [id number] Yes Inhale 1  puff into the lungs in the morning and at bedtime. Valentina Shaggy, MD Taking Active   albuterol [facility] [place]) 108 [phone] Base) MCG/ACT inhaler [id number] Yes INHALE 1 TO 2 PUFFS INTO THE LUNGS EVERY 6 HOURS AS NEEDED WHEEZING/SHORTNESS OF Nathen May, MD Taking Active   amLODipine (NORVASC) 10 MG tablet [id number] Yes TAKE 1 TABLET BY MOUTH EVERY DAY Billie Ruddy, MD Taking Active   aspirin EC 81 MG EC tablet [id number] Yes Take 1 tablet (81 mg total) by mouth daily. Hosie Poisson, MD Taking Active   atorvastatin (LIPITOR) 40 MG tablet [id number] Yes Take 1 tablet (40 mg total) by mouth daily at 6 PM. Billie Ruddy, MD Taking Active   b complex vitamins capsule [id number] Yes Take 1 capsule by mouth daily. [provider] Taking Active   Boswellia-Glucosamine-Vit D (OSTEO BI-FLEX ONE PER DAY PO) [id number] Yes Take by mouth. [provider] Taking Active   carvedilol (COREG) 12.5 MG tablet [id number] Yes Take 12.5 mg by mouth 2 (two) times daily. [provider] Taking Active   cetirizine (ZYRTEC) 10 MG tablet [id number] Yes Take 1 tablet (10 mg total) by mouth daily. Valentina Shaggy, MD Taking Active   Ferrous Sulfate (IRON) 325 (65 Fe) MG TABS [id number] Yes Take 1 tablet by mouth every morning. [provider] Taking Active   fluticasone (FLONASE) 50 MCG/ACT nasal spray [id number] Yes Place 2 sprays into both nostrils daily. Valentina Shaggy, MD Taking Active   haloperidol (HALDOL) 5 MG tablet [id number] Yes Take 0.5 tablets (2.5 mg total) by mouth at bedtime. Take 1/2 tab at bed time Arfeen, Arlyce Harman, MD Taking Active   hydrALAZINE (APRESOLINE) 25 MG tablet [id number] Yes Take 0.5 tablets (12.5 mg total) by mouth in the morning and at bedtime. Billie Ruddy, MD Taking Active   levothyroxine (SYNTHROID) 50 MCG tablet [id number] Yes TAKE 1 TABLET BY MOUTH EVERY DAY BEFORE BREAKFAST Billie Ruddy, MD Taking Active   lisinopril  (ZESTRIL) 40 MG tablet [id number] Yes TAKE 1 TABLET BY MOUTH EVERY DAY Billie Ruddy, MD Taking Active   Multiple Vitamins-Minerals (MULTIVITAMIN WITH MINERALS) tablet [id number] Yes Take 1 tablet by mouth every evening. [provider] Taking Active   omeprazole (PRILOSEC) 40 MG capsule [id number] Yes Take 1 capsule by mouth daily. [provider] Taking Active   Semaglutide (OZEMPIC, 0.25 OR 0.5 MG/DOSE, [place]) [id number] Yes Inject into the skin. 0.25 mg once weekly for 4 weeks, then increase to 0.5 mg once weekly [provider]  Active             Patient Active  Problem List   Diagnosis Date Noted   Paranoid schizophrenia ([place]) 10/16/2019   CKD (chronic kidney disease), stage III ([place]) 08/29/2019   OSA (obstructive sleep apnea) 04/29/2016   GERD (gastroesophageal reflux disease) 10/28/2015   Asthma, mild persistent 04/21/2015   Type 2 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 disease, without long-term current use of insulin ([place]) 04/19/2014   Angina, [place] 12/30/2013   Hypertension 01/10/2013   Hyperlipidemia 01/10/2013   Hypothyroidism 01/10/2013    Immunization History  Administered Date(s) Administered   Influenza Inj Mdck Quad Pf 10/01/2020   Influenza,inj,Quad PF,6+ Mos 10/21/2014, 10/28/2015, 11/04/2016, 02/14/2018, 09/28/2018   PFIZER(Purple Top)SARS-COV-2 Vaccination 02/29/2020, 03/22/2020, 10/01/2020   Pneumococcal Polysaccharide-23 10/21/2014, 03/21/2019   Td 04/18/2016    Conditions to be addressed/monitored: HTN, HLD, DMII, asthma, seasonal allergies, hypothyroidism  Care Plan : Pharmacy - Med Mgmt/Assistance  Updates made by Vella Raring, RPH-CPP since 05/25/2021 12:00 AM     Problem: Disease Progression      Long-Range Goal: Disease Progression Prevented or Minimized   Start Date: 04/24/2021  Expected End Date: 07/23/2021  This Visit's Progress: On track  Recent Progress: On track  Priority: High  Note:   Current  Barriers:  Chronic Disease Management support, education, and care coordination needs related to HTN, HLD, GERD, asthma, CKD and schizophrenia Unable to independently afford treatment regimen Reports difficulty with affording cost of Advair and albuterol inhalers and Ozempic through [employer] health plan Patient APPROVED for [place] patient assistance from [place]):  Over the next 90 days, patient will verbalize ability to afford treatment regimen through collaboration with PharmD and provider.   Interventions: 1:1 collaboration with Jearld Fenton, NP regarding development and update of comprehensive plan of care as evidenced by provider attestation and co-signature Inter-disciplinary care team collaboration (see longitudinal plan of care) Perform chart review.  Patient seen for Office Visit with Allergy and [place] on 5/31 Video Visit with Psychiatrist on 6/1 Comprehensive medication review performed; medication list updated in electronic medical record Reports that she discussed haloperidol dose with psychiatrist at appointment on 6/1 and will continue taking haloperidol 5 mg -  tablet (0.5 mg) daily at bedtime as directed    Asthma: Patient followed by Allergy and [place] Current treatment: Advair 250-50 mcg/dose - 1 puff twice daily Albuterol HFA as needed Counsel patient on rational for/encourage use of Advair maintenance inhaler twice daily and to rinse mouth out after use and of rescue inhaler (albuterol) as needed as directed  Seasonal Allergies: Patient followed by Allergy and [place] Current treatment: Cetirizine 10 mg daily Fluticasone nasal spray - 2 sprays per nostril daily Reports not currently taking loratadine as sent in by Allergy provider as taking cetirizine daily instead  Hypertension: Controlled; current treatment: Amlodipine 10 mg daily Hydralazine 25 mg -  tablet  (12.5 mg) twice daily in morning and at bedtime Lisinopril 40 mg daily Carvedilol 12.5 mg twice daily  Reports replaced BP monitor battery and last checked yesterday: 114/77, HR 85 Encourage patient to continue to monitor home BP, keep log of results and bring record to medical appointments  Hyperlipidemia: Current treatment: Atorvastatin 40 mg daily   Hypothyroidism: Controlled; current treatment: levothyroxine 50 mcg daily before breakfast Encourage patient to continue to take consistently with regard to separation of vitamins/minerals  T2DM/weight loss/medication assistance: Controlled; current treatment: none Patient interested in starting Ozempic as discussed with PCP for weight loss benefit Collaborated with [facility]  CPhT and PCP to assist patient with applying for [employer] patient assistance program Received notification from [place] that patient was approved for Ozempic patient assistance, medication expected to arrive to office in next 2-3 weeks Today counsel patient on Ozempic, including mechanism of action, side effects, and benefits. No personal or family history of medullary thyroid cancer, personal history of pancreatitis or gallbladder disease. Counseled on potential side effects of nausea, stomach upset, queasiness, constipation, and that these generally improve over time. Advised to contact our office with more severe symptoms, including nausea, diarrhea, stomach pain. Patient verbalized understanding Counsel patient on Ozempic initiation: 0.25 mg once weekly for 4 weeks before increasing to 0.5 mg once weekly if tolerating medication well. Will send patient Ozempic video/how to use instruction via MyChart message. Patient agrees to watch and contact CM Pharmacist/office for questions  Patient Goals/Self-Care Activities Over the next 90 days, patient will:  - take medications as prescribed using weekly pillbox - check blood pressure, document, and provide at future  appointments  Follow Up Plan: Telephone follow up appointment with care management team member scheduled for: 7/25 at 1 pm     Medication Assistance: APPROVED for [place] patient assistance from [employer]  Patient's preferred pharmacy is:  [id number] [place], [place] [id number]: [phone]: [phone] Uses pill box? Yes   Follow Up:  Patient agrees to Care Plan and Follow-up.  EHarlow Asa PharmD, BPara [place] CPP Clinical Pharmacist [phone]

## 2021-05-25 NOTE — Chronic Care Management (AMB) (Signed)
[facility]) Care Management  05/25/2021  Maria Franklin 19-Aug-1961 [id number]   CCM LCSW received message from [place] for case closure with LCSW. Per Care Guide, patient has obtained a therapist and is not in need of LCSW support services at this time.   Christa See, MSW, [facility] Care Management [name] Phone [phone] 8:51 AM

## 2021-05-25 NOTE — Patient Instructions (Signed)
Visit Information  PATIENT GOALS:  Goals Addressed             This Visit's Progress    Pharmacy Goals       Our goal bad cholesterol, or LDL, is less than 70 . This is why it is important to continue taking your atorvastatin.  Please check your home blood pressure, keep a log of the results and bring this with you to your medical appointments.  Please review the Ozempic video/how to use instructions as sent via MyChart message  Feel free to call me with any questions or concerns. I look forward to our next call!   Harlow Asa, PharmD, Para [place], CPP Clinical Pharmacist [facility] [phone]         Patient verbalizes understanding of instructions provided today and agrees to view in [place].   Telephone follow up appointment with care management team member scheduled for: 7/25 at 1 pm

## 2021-05-26 DIAGNOSIS — M7732 Calcaneal spur, left foot: Secondary | ICD-10-CM | POA: Diagnosis not present

## 2021-05-26 DIAGNOSIS — M24571 Contracture, right ankle: Secondary | ICD-10-CM | POA: Diagnosis not present

## 2021-06-01 ENCOUNTER — Telehealth: Payer: Medicare HMO | Admitting: Family

## 2021-06-01 DIAGNOSIS — J209 Acute bronchitis, unspecified: Secondary | ICD-10-CM

## 2021-06-01 DIAGNOSIS — J4530 Mild persistent asthma, uncomplicated: Secondary | ICD-10-CM | POA: Diagnosis not present

## 2021-06-01 MED ORDER — PREDNISONE 10 MG (21) PO TBPK: ORAL_TABLET | ORAL | 0 refills | Status: DC

## 2021-06-01 MED ORDER — BENZONATATE 100 MG PO CAPS: 100.0000 mg | ORAL_CAPSULE | Freq: Three times a day (TID) | ORAL | 0 refills | Status: DC | PRN

## 2021-06-01 NOTE — Progress Notes (Signed)
We are sorry that you are not feeling well.  Here is how we plan to help! ° °Based on your presentation I believe you most likely have A cough due to a virus.  This is called viral bronchitis and is best treated by rest, plenty of fluids and control of the cough.  You may use Ibuprofen or Tylenol as directed to help your symptoms.   °  °In addition you may use A non-prescription cough medication called Robitussin DAC. Take 2 teaspoons every 8 hours or Delsym: take 2 teaspoons every 12 hours., A non-prescription cough medication called Mucinex DM: take 2 tablets every 12 hours., and A prescription cough medication called Tessalon Perles 100mg. You may take 1-2 capsules every 8 hours as needed for your cough. ° °Prednisone 10 mg daily for 6 days (see taper instructions below) ° °Directions for 6 day taper: °Day 1: 2 tablets before breakfast, 1 after both lunch & dinner and 2 at bedtime °Day 2: 1 tab before breakfast, 1 after both lunch & dinner and 2 at bedtime °Day 3: 1 tab at each meal & 1 at bedtime °Day 4: 1 tab at breakfast, 1 at lunch, 1 at bedtime °Day 5: 1 tab at breakfast & 1 tab at bedtime °Day 6: 1 tab at breakfast ° °From your responses in the eVisit questionnaire you describe inflammation in the upper respiratory tract which is causing a significant cough.  This is commonly called Bronchitis and has four common causes:   °Allergies °Viral Infections °Acid Reflux °Bacterial Infection °Allergies, viruses and acid reflux are treated by controlling symptoms or eliminating the cause. An example might be a cough caused by taking certain blood pressure medications. You stop the cough by changing the medication. Another example might be a cough caused by acid reflux. Controlling the reflux helps control the cough. ° °USE OF BRONCHODILATOR ("RESCUE") INHALERS: °There is a risk from using your bronchodilator too frequently.  The risk is that over-reliance on a medication which only relaxes the muscles surrounding  the breathing tubes can reduce the effectiveness of medications prescribed to reduce swelling and congestion of the tubes themselves.  Although you feel brief relief from the bronchodilator inhaler, your asthma may actually be worsening with the tubes becoming more swollen and filled with mucus.  This can delay other crucial treatments, such as oral steroid medications. If you need to use a bronchodilator inhaler daily, several times per day, you should discuss this with your provider.  There are probably better treatments that could be used to keep your asthma under control.  °   °HOME CARE °Only take medications as instructed by your medical team. °Complete the entire course of an antibiotic. °Drink plenty of fluids and get plenty of rest. °Avoid close contacts especially the very young and the elderly °Cover your mouth if you cough or cough into your sleeve. °Always remember to wash your hands °A steam or ultrasonic humidifier can help congestion.  ° °GET HELP RIGHT AWAY IF: °You develop worsening fever. °You become short of breath °You cough up blood. °Your symptoms persist after you have completed your treatment plan °MAKE SURE YOU  °Understand these instructions. °Will watch your condition. °Will get help right away if you are not doing well or get worse. °  ° °Thank you for choosing an e-visit. ° °Your e-visit answers were reviewed by a board certified advanced clinical practitioner to complete your personal care plan. Depending upon the condition, your plan could have included both   over the counter or prescription medications. ° °Please review your pharmacy choice. Make sure the pharmacy is open so you can pick up prescription now. If there is a problem, you may contact your provider through MyChart messaging and have the prescription routed to another pharmacy.  Your safety is important to us. If you have drug allergies check your prescription carefully.  ° °For the next 24 hours you can use MyChart to ask  questions about today's visit, request a non-urgent call back, or ask for a work or school excuse. °You will get an email in the next two days asking about your experience. I hope that your e-visit has been valuable and will speed your recovery. ° °Approximately 5 minutes was spent documenting and reviewing patient's chart.  ° ° °

## 2021-06-15 ENCOUNTER — Telehealth: Payer: Self-pay

## 2021-06-15 NOTE — Telephone Encounter (Signed)
The pt was called and notified that her Ozempic came in and is available for pick up at her convenience.

## 2021-06-23 DIAGNOSIS — Z6841 Body Mass Index (BMI) 40.0 and over, adult: Secondary | ICD-10-CM | POA: Diagnosis not present

## 2021-06-23 DIAGNOSIS — G8929 Other chronic pain: Secondary | ICD-10-CM | POA: Diagnosis not present

## 2021-06-23 DIAGNOSIS — J449 Chronic obstructive pulmonary disease, unspecified: Secondary | ICD-10-CM | POA: Diagnosis not present

## 2021-06-23 DIAGNOSIS — E039 Hypothyroidism, unspecified: Secondary | ICD-10-CM | POA: Diagnosis not present

## 2021-06-23 DIAGNOSIS — E785 Hyperlipidemia, unspecified: Secondary | ICD-10-CM | POA: Diagnosis not present

## 2021-06-23 DIAGNOSIS — I129 Hypertensive chronic kidney disease with stage 1 through stage 4 chronic kidney disease, or unspecified chronic kidney disease: Secondary | ICD-10-CM | POA: Diagnosis not present

## 2021-06-23 DIAGNOSIS — R69 Illness, unspecified: Secondary | ICD-10-CM | POA: Diagnosis not present

## 2021-06-23 DIAGNOSIS — E1122 Type 2 diabetes mellitus with diabetic chronic kidney disease: Secondary | ICD-10-CM | POA: Diagnosis not present

## 2021-06-26 ENCOUNTER — Ambulatory Visit (INDEPENDENT_AMBULATORY_CARE_PROVIDER_SITE_OTHER): Payer: Medicare HMO | Admitting: Pharmacist

## 2021-06-26 DIAGNOSIS — I10 Essential (primary) hypertension: Secondary | ICD-10-CM | POA: Diagnosis not present

## 2021-06-26 DIAGNOSIS — E119 Type 2 diabetes mellitus without complications: Secondary | ICD-10-CM

## 2021-06-26 DIAGNOSIS — J4530 Mild persistent asthma, uncomplicated: Secondary | ICD-10-CM | POA: Diagnosis not present

## 2021-06-26 NOTE — Patient Instructions (Signed)
Visit Information  PATIENT GOALS:  Goals Addressed             This Visit's Progress    Pharmacy Goals       Our goal bad cholesterol, or LDL, is less than 70 . This is why it is important to continue taking your atorvastatin.  Please check your home blood pressure, keep a log of the results and bring this with you to your medical appointments.   Feel free to call me with any questions or concerns. I look forward to our next call!   Harlow Asa, PharmD, Para [place], CPP Clinical Pharmacist [facility] [phone]        The patient verbalized understanding of instructions, educational materials, and care plan provided today and declined offer to receive copy of patient instructions, educational materials, and care plan.   Telephone follow up appointment with care management team member scheduled for: 12/7 The patient has been provided with contact information for the care management team and has been advised to call with any health related questions or concerns.

## 2021-06-26 NOTE — Chronic Care Management (AMB) (Signed)
Chronic Care Management Pharmacy Note  06/26/2021 Name:  Maria Franklin MRN:  [id number] DOB:  07-22-1961   Subjective: Maria Franklin is an 60 y.o. year old female who is a primary patient of Jearld Fenton, NP.  The CCM team was consulted for assistance with disease management and care coordination needs.    Engaged with patient by telephone for follow up visit in response to provider referral for pharmacy case management and/or care coordination services.   Consent to Services:  The patient was given information about Chronic Care Management services, agreed to services, and gave verbal consent prior to initiation of services.  Please see initial visit note for detailed documentation.   Patient Care Team: Jearld Fenton, NP as PCP - General (Internal Medicine) Jettie Booze, MD as PCP - Cardiology (Cardiology) Yolunda Kloos, Virl Diamond, RPH-CPP (Pharmacist)   Recent consult visits: Patient seen for E-Visit with [place] on 6/27 for cough  Hospital visits: None in previous 6 months  Objective:  Lab Results  Component Value Date   CREATININE 1.20 (H) 04/23/2021   CREATININE 1.39 (H) 11/18/2020   CREATININE 1.14 (H) 07/28/2020    Lab Results  Component Value Date   HGBA1C 6.3 (H) 04/23/2021   Last diabetic Eye exam:  Lab Results  Component Value Date/Time   HMDIABEYEEXA No Retinopathy 09/09/2020 12:00 AM    Last diabetic Foot exam: No results found for: HMDIABFOOTEX      Component Value Date/Time   CHOL 170 04/23/2021 1055   TRIG 99 04/23/2021 1055   HDL 74 04/23/2021 1055   CHOLHDL 2.3 04/23/2021 1055   VLDL 18 10/17/2019 0630   LDLCALC 78 04/23/2021 1055   LDLDIRECT 97.1 01/14/2014 1224    Hepatic Function Latest Ref Rng & Units 04/23/2021 07/28/2020 10/17/2019  Total Protein 6.1 - 8.1 g/dL 6.9 6.9 7.8  Albumin 3.5 - 5.0 g/dL - - 3.8  AST 10 - 35 U/L $Remo'19 20 23  'ApLuI$ ALT 6 - 29 U/L $Remo'21 19 24  'bESpw$ Alk Phosphatase 38 - 126 U/L - - 89  Total  Bilirubin 0.2 - 1.2 mg/dL 0.3 0.3 0.6    Lab Results  Component Value Date/Time   TSH 2.06 04/23/2021 10:55 AM   TSH 3.27 07/28/2020 09:42 AM   FREET4 1.1 04/23/2021 10:55 AM   FREET4 1.0 07/28/2020 09:42 AM    Social History   Tobacco Use  Smoking Status Former   Packs/day: 0.25   Years: 6.00   Pack years: 1.50   Types: Cigarettes  Smokeless Tobacco Never  Tobacco Comments   QUIT IN HER EARLY 20'S   BP Readings from Last 3 Encounters:  05/05/21 124/80  04/23/21 117/66  01/23/21 119/80   Pulse Readings from Last 3 Encounters:  05/05/21 82  04/23/21 100  01/23/21 94   Wt Readings from Last 3 Encounters:  05/06/21 264 lb (119.7 kg)  05/05/21 264 lb 6.4 oz (119.9 kg)  04/23/21 267 lb 6.4 oz (121.3 kg)    Assessment: Review of patient past medical history, allergies, medications, health status, including review of consultants reports, laboratory and other test data, was performed as part of comprehensive evaluation and provision of chronic care management services.   SDOH:  (Social Determinants of Health) assessments and interventions performed: None   CCM Care Plan  Allergies  Allergen Reactions   Bee Pollen Cough   Pollen Extract Cough   Latex Itching, Rash and Other (See Comments)    Severe itching  Medications Reviewed Today     Reviewed by Vella Raring, RPH-CPP (Pharmacist) on 06/26/21 at 1217  Med List Status: <None>   Medication Order Taking? Sig Documenting Provider Last Dose Status Informant  ADVAIR DISKUS 250-50 MCG/ACT AEPB [id number] Yes Inhale 1 puff into the lungs in the morning and at bedtime. Valentina Shaggy, MD Taking Active   albuterol [facility] HFA) 108 [phone] Base) MCG/ACT inhaler [id number] Yes INHALE 1 TO 2 PUFFS INTO THE LUNGS EVERY 6 HOURS AS NEEDED WHEEZING/SHORTNESS OF Nathen May, MD Taking Active   amLODipine (NORVASC) 10 MG tablet [id number] Yes TAKE 1 TABLET BY MOUTH EVERY DAY Billie Ruddy, MD Taking  Active   aspirin EC 81 MG EC tablet [id number]  Take 1 tablet (81 mg total) by mouth daily. Hosie Poisson, MD  Active   atorvastatin (LIPITOR) 40 MG tablet [id number]  Take 1 tablet (40 mg total) by mouth daily at 6 PM. Billie Ruddy, MD  Active   b complex vitamins capsule [id number]  Take 1 capsule by mouth daily. [provider]  Active   Boswellia-Glucosamine-Vit D (OSTEO BI-FLEX ONE PER DAY PO) [id number]  Take by mouth. [provider]  Active   carvedilol (COREG) 12.5 MG tablet [id number] Yes Take 12.5 mg by mouth 2 (two) times daily. [provider] Taking Active   cetirizine (ZYRTEC) 10 MG tablet [id number] Yes Take 1 tablet (10 mg total) by mouth daily. Valentina Shaggy, MD Taking Active   Ferrous Sulfate (IRON) 325 (65 Fe) MG TABS [id number]  Take 1 tablet by mouth every morning. [provider]  Active   fluticasone (FLONASE) 50 MCG/ACT nasal spray [id number] Yes Place 2 sprays into both nostrils daily. Valentina Shaggy, MD Taking Active   haloperidol (HALDOL) 5 MG tablet [id number]  Take 0.5 tablets (2.5 mg total) by mouth at bedtime. Take 1/2 tab at bed time Arfeen, Arlyce Harman, MD  Active   hydrALAZINE (APRESOLINE) 25 MG tablet [id number] Yes Take 0.5 tablets (12.5 mg total) by mouth in the morning and at bedtime. Billie Ruddy, MD Taking Active   levothyroxine (SYNTHROID) 50 MCG tablet [id number] Yes TAKE 1 TABLET BY MOUTH EVERY DAY BEFORE BREAKFAST Billie Ruddy, MD Taking Active   lisinopril (ZESTRIL) 40 MG tablet [id number] Yes TAKE 1 TABLET BY MOUTH EVERY DAY Billie Ruddy, MD Taking Active   Multiple Vitamins-Minerals (MULTIVITAMIN WITH MINERALS) tablet [id number]  Take 1 tablet by mouth every evening. [provider]  Active   omeprazole (PRILOSEC) 40 MG capsule [id number]  Take 1 capsule by mouth daily. [provider]  Active   Semaglutide (OZEMPIC, 0.25 OR 0.5 MG/DOSE, [place]) [id number] Yes Inject 0.5 mg into the skin once a  week. 0.25 mg once weekly for 4 weeks, then increase to 0.5 mg once weekly [provider] Taking Active             Patient Active Problem List   Diagnosis Date Noted   Paranoid schizophrenia ([place]) 10/16/2019   CKD (chronic kidney disease), stage III ([place]) 08/29/2019   OSA (obstructive sleep apnea) 04/29/2016   GERD (gastroesophageal reflux disease) 10/28/2015   Asthma, mild persistent 04/21/2015   Type 2 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 disease, without long-term current use of insulin ([place]) 04/19/2014   Angina, Ludwig 12/30/2013   Hypertension 01/10/2013   Hyperlipidemia 01/10/2013   Hypothyroidism 01/10/2013    Immunization History  Administered Date(s) Administered   Influenza Inj Mdck Quad Pf  10/01/2020   Influenza,inj,Quad PF,6+ Mos 10/21/2014, 10/28/2015, 11/04/2016, 02/14/2018, 09/28/2018   PFIZER(Purple Top)SARS-COV-2 Vaccination 02/29/2020, 03/22/2020, 10/01/2020   Pneumococcal Polysaccharide-23 10/21/2014, 03/21/2019   Td 04/18/2016    Conditions to be addressed/monitored: Asthma, T2DM, HTN, HLD, hypothyroidism  Care Plan : Pharmacy - Med Mgmt/Assistance  Updates made by Vella Raring, RPH-CPP since 06/26/2021 12:00 AM     Problem: Disease Progression      Long-Range Goal: Disease Progression Prevented or Minimized   Start Date: 04/24/2021  Expected End Date: 07/23/2021  This Visit's Progress: On track  Recent Progress: On track  Priority: High  Note:   Current Barriers:  Chronic Disease Management support, education, and care coordination needs related to HTN, HLD, GERD, asthma, CKD and schizophrenia Unable to independently afford treatment regimen Reports difficulty with affording cost of Advair and albuterol inhalers and Ozempic through [employer] health plan Patient APPROVED for [place] patient assistance from [place]):  Over the next 90 days, patient will verbalize ability to afford  treatment regimen through collaboration with PharmD and provider.   Interventions: 1:1 collaboration with Jearld Fenton, NP regarding development and update of comprehensive plan of care as evidenced by provider attestation and co-signature Inter-disciplinary care team collaboration (see longitudinal plan of care) Perform chart review.  Patient seen for E-Visit with [place] on 6/27 for cough    Medication Assistance: Received message from [place] on 7/11 advising Ozempic from patient assistance program had been shipped to office and patient notified Note from previous discussion, based on reported income, patient does not meet income limit for [place] patient assistance program for Advair or patient assistance programs for therapeutic alternatives Today encourage patient to consider signing up for [place] waitlist  T2DM/weight loss Controlled; current treatment: Ozempic 0.5 mg weekly on Mondays (started on 7/11) Reports forgot to start with Ozempic 0.25 mg weekly dose (for GI side effect risk reduction), but reports has been tolerating well. Reports thinks she has been a little less hungry since starting Encourage patient to eat regular well-balanced meals throughout the day Reports exercise limited by foot pain Reports picking up custom orthotics on Tuesday  Asthma: Reports had cough/illness for ~2 weeks starting end of June, but now resolved Patient followed by [facility] and [place] Current treatment: Advair 250-50 mcg/dose - 1 puff twice daily Albuterol HFA as needed Encourage use of Advair maintenance inhaler twice daily and to rinse mouth out after use and of rescue inhaler (albuterol) as needed as directed  Seasonal Allergies: Patient followed by [facility] [place] Current treatment: Cetirizine 10 mg daily Fluticasone nasal spray - 2 sprays per nostril daily  Hypertension: Controlled; current  treatment: Amlodipine 10 mg daily Hydralazine 25 mg -  tablet (12.5 mg) twice daily in morning and at bedtime Lisinopril 40 mg daily Carvedilol 12.5 mg twice daily  Reports recalls latest BP (~1 week ago): ~117/77 Encourage patient to continue to monitor home BP, keep log of results and bring record to medical appointments  Hyperlipidemia: Current treatment: Atorvastatin 40 mg daily   Hypothyroidism: Controlled; current treatment: levothyroxine 50 mcg daily before breakfast Encourage patient to continue to take consistently with regard to separation of vitamins/minerals  Patient denies further medication questions/concerns at this time. Confirms has phone number for CM Pharmacist and will call if needed.  Prefers next scheduled follow up with CM Pharmacist at end of year for medication assistance program re-enrollment  Patient Goals/Self-Care  Activities Over the next 90 days, patient will:  - take medications as prescribed using weekly pillbox - check blood pressure, document, and provide at future appointments - attend medical appointment as scheduled  Next appointment with PCP 11/02/2021  Follow Up Plan: Telephone follow up appointment with care management team member scheduled for: 12/7 for medication assistance follow up     Medication Assistance: Patient APPROVED for [place] patient assistance from [employer]  Patient's preferred pharmacy is:  [id number] [place], [place] - [address] [place] [address] Phone: [phone] Fax: [phone]   Follow Up:  Patient agrees to Care Plan and Follow-up.  Wallace Cullens, PharmD, Para [place], CPP Clinical Pharmacist [facility] [phone]

## 2021-06-29 ENCOUNTER — Telehealth: Payer: Self-pay

## 2021-06-30 DIAGNOSIS — M7732 Calcaneal spur, left foot: Secondary | ICD-10-CM | POA: Diagnosis not present

## 2021-06-30 DIAGNOSIS — M24571 Contracture, right ankle: Secondary | ICD-10-CM | POA: Diagnosis not present

## 2021-07-15 ENCOUNTER — Ambulatory Visit (INDEPENDENT_AMBULATORY_CARE_PROVIDER_SITE_OTHER): Payer: Medicare HMO | Admitting: Pharmacist

## 2021-07-15 DIAGNOSIS — N1831 Chronic kidney disease, stage 3a: Secondary | ICD-10-CM | POA: Diagnosis not present

## 2021-07-15 DIAGNOSIS — I10 Essential (primary) hypertension: Secondary | ICD-10-CM

## 2021-07-15 NOTE — Chronic Care Management (AMB) (Signed)
Chronic Care Management Pharmacy Note  07/15/2021 Name:  Maria Franklin MRN:  [id number] DOB:  23-Nov-1961   Subjective: Maria Franklin is an 60 y.o. year old female who is a primary patient of Jearld Fenton, NP.  The CCM team was consulted for assistance with disease management and care coordination needs.    Engaged with patient by telephone for follow up visit in response to provider referral for pharmacy case management and/or care coordination services.   Consent to Services:  The patient was given information about Chronic Care Management services, agreed to services, and gave verbal consent prior to initiation of services.  Please see initial visit note for detailed documentation.   Patient Care Team: Jearld Fenton, NP as PCP - General (Internal Medicine) Jettie Booze, MD as PCP - Cardiology (Cardiology) Curley Spice, Virl Diamond, RPH-CPP (Pharmacist)  Recent office visits: None  Hospital visits: None in previous 6 months  Objective:  Lab Results  Component Value Date   CREATININE 1.20 (H) 04/23/2021   CREATININE 1.39 (H) 11/18/2020   CREATININE 1.14 (H) 07/28/2020    Lab Results  Component Value Date   HGBA1C 6.3 (H) 04/23/2021   Last diabetic Eye exam:  Lab Results  Component Value Date/Time   HMDIABEYEEXA No Retinopathy 09/09/2020 12:00 AM    Last diabetic Foot exam: No results found for: HMDIABFOOTEX    Social History   Tobacco Use  Smoking Status Former   Packs/day: 0.25   Years: 6.00   Pack years: 1.50   Types: Cigarettes  Smokeless Tobacco Never  Tobacco Comments   QUIT IN HER EARLY 20'S   BP Readings from Last 3 Encounters:  05/05/21 124/80  04/23/21 117/66  01/23/21 119/80   Pulse Readings from Last 3 Encounters:  05/05/21 82  04/23/21 100  01/23/21 94   Wt Readings from Last 3 Encounters:  05/06/21 264 lb (119.7 kg)  05/05/21 264 lb 6.4 oz (119.9 kg)  04/23/21 267 lb 6.4 oz (121.3 kg)    Assessment: Review of  patient past medical history, allergies, medications, health status, including review of consultants reports, laboratory and other test data, was performed as part of comprehensive evaluation and provision of chronic care management services.   SDOH:  (Social Determinants of Health) assessments and interventions performed: none   CCM Care Plan  Allergies  Allergen Reactions   Bee Pollen Cough   Pollen Extract Cough   Latex Itching, Rash and Other (See Comments)    Severe itching    Medications Reviewed Today     Reviewed by Vella Raring, RPH-CPP (Pharmacist) on 06/26/21 at 1217  Med List Status: <None>   Medication Order Taking? Sig Documenting Provider Last Dose Status Informant  ADVAIR DISKUS 250-50 MCG/ACT AEPB [id number] Yes Inhale 1 puff into the lungs in the morning and at bedtime. Valentina Shaggy, MD Taking Active   albuterol [facility] HFA) [phone] Base) MCG/ACT inhaler [id number] Yes INHALE 1 TO 2 PUFFS INTO THE LUNGS EVERY 6 HOURS AS NEEDED WHEEZING/SHORTNESS OF Nathen May, MD Taking Active   amLODipine (NORVASC) 10 MG tablet [id number] Yes TAKE 1 TABLET BY MOUTH EVERY DAY Billie Ruddy, MD Taking Active   aspirin EC 81 MG EC tablet [id number]  Take 1 tablet (81 mg total) by mouth daily. Hosie Poisson, MD  Active   atorvastatin (LIPITOR) 40 MG tablet [id number]  Take 1 tablet (40 mg total) by mouth daily at 6 PM. Billie Ruddy, MD  Active   b complex vitamins capsule [id number]  Take 1 capsule by mouth daily. [provider]  Active   Boswellia-Glucosamine-Vit D (OSTEO BI-FLEX ONE PER DAY PO) [id number]  Take by mouth. [provider]  Active   carvedilol (COREG) 12.5 MG tablet [id number] Yes Take 12.5 mg by mouth 2 (two) times daily. [provider] Taking Active   cetirizine (ZYRTEC) 10 MG tablet [id number] Yes Take 1 tablet (10 mg total) by mouth daily. Valentina Shaggy, MD Taking Active   Ferrous Sulfate (IRON) 325  (65 Fe) MG TABS [id number]  Take 1 tablet by mouth every morning. [provider]  Active   fluticasone (FLONASE) 50 MCG/ACT nasal spray [id number] Yes Place 2 sprays into both nostrils daily. Valentina Shaggy, MD Taking Active   haloperidol (HALDOL) 5 MG tablet [id number]  Take 0.5 tablets (2.5 mg total) by mouth at bedtime. Take 1/2 tab at bed time Arfeen, Arlyce Harman, MD  Active   hydrALAZINE (APRESOLINE) 25 MG tablet [id number] Yes Take 0.5 tablets (12.5 mg total) by mouth in the morning and at bedtime. Billie Ruddy, MD Taking Active   levothyroxine (SYNTHROID) 50 MCG tablet [id number] Yes TAKE 1 TABLET BY MOUTH EVERY DAY BEFORE BREAKFAST Billie Ruddy, MD Taking Active   lisinopril (ZESTRIL) 40 MG tablet [id number] Yes TAKE 1 TABLET BY MOUTH EVERY DAY Billie Ruddy, MD Taking Active   Multiple Vitamins-Minerals (MULTIVITAMIN WITH MINERALS) tablet [id number]  Take 1 tablet by mouth every evening. [provider]  Active   omeprazole (PRILOSEC) 40 MG capsule [id number]  Take 1 capsule by mouth daily. [provider]  Active   Semaglutide (OZEMPIC, 0.25 OR 0.5 MG/DOSE, [place]) [id number] Yes Inject 0.5 mg into the skin once a week. 0.25 mg once weekly for 4 weeks, then increase to 0.5 mg once weekly [provider] Taking Active             Patient Active Problem List   Diagnosis Date Noted   Paranoid schizophrenia ([place]) 10/16/2019   CKD (chronic kidney disease), stage III ([place]) 08/29/2019   OSA (obstructive sleep apnea) 04/29/2016   GERD (gastroesophageal reflux disease) 10/28/2015   Asthma, mild persistent 04/21/2015   Type 2 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 disease, without long-term current use of insulin ([place]) 04/19/2014   Angina, Ludwig 12/30/2013   Hypertension 01/10/2013   Hyperlipidemia 01/10/2013   Hypothyroidism 01/10/2013    Immunization History  Administered Date(s) Administered   Influenza Inj Mdck Quad Pf 10/01/2020    Influenza,inj,Quad PF,6+ Mos 10/21/2014, 10/28/2015, 11/04/2016, 02/14/2018, 09/28/2018   PFIZER(Purple Top)SARS-COV-2 Vaccination 02/29/2020, 03/22/2020, 10/01/2020   Pneumococcal Polysaccharide-23 10/21/2014, 03/21/2019   Td 04/18/2016    Conditions to be addressed/monitored: Asthma, T2DM, HTN, HLD, hypothyroidism  Care Plan : Pharmacy - Med Mgmt/Assistance  Updates made by Rennis Petty, RPH-CPP since 07/15/2021 12:00 AM     Problem: Disease Progression      Long-Range Goal: Disease Progression Prevented or Minimized   Start Date: 04/24/2021  Expected End Date: 07/23/2021  This Visit's Progress: On track  Recent Progress: On track  Priority: High  Note:   Current Barriers:  Chronic Disease Management support, education, and care coordination needs related to HTN, HLD, GERD, asthma, CKD and schizophrenia Unable to independently afford treatment regimen Reports difficulty with affording cost of Advair and albuterol inhalers and Ozempic through [employer] health plan Patient APPROVED for [place] patient assistance from [place]):  Over  the next 90 days, patient will verbalize ability to afford treatment regimen through collaboration with PharmD and provider.   Interventions: 1:1 collaboration with Jearld Fenton, NP regarding development and update of comprehensive plan of care as evidenced by provider attestation and co-signature Inter-disciplinary care team collaboration (see longitudinal plan of care) Receive a voicemail from patient requesting a call back. Return call Patient reports has recently restarted taking meloxicam 15 mg daily for foot inflammation as prescribed by Podiatrist and asks about potential interactions/concerns with this medication Denies taking any OTC NSAIDs Reports started meloxicam around same time as picked up and started using orthotics from Podiatry. Describes feeling significant improvement in mobility since  then due to improvement in foot pain, but reports also noticed a significant improvement in shoulder pain and stiffness all over her body since restarted meloxicam Encourage patient to follow up with PCP regarding report of shoulder pain and all over body stiffness that she reports as having been chronic and significantly impacting her mobility Counsel patient on concerns with NSAID use, including kidney concerns (particularly given her CKD), cardiovascular concerns and potential to increase risk of bleeding as also taking daily aspirin 81 mg Encourage patient to use meloxicam only as needed Will collaborate with PCP  T2DM/weight loss Controlled; current treatment: Ozempic 0.5 mg weekly  Reports tolerating well Reports has noticed impact on appetite, satiated earlier, but has not noticed change in weight Encourage patient to eat regular well-balanced meals throughout the day Reports ability to exercise increased now that foot pain improved  Hypertension: Controlled; current treatment: Amlodipine 10 mg daily Hydralazine 25 mg -  tablet (12.5 mg) twice daily in morning and at bedtime Lisinopril 40 mg daily Carvedilol 12.5 mg twice daily  Reports home BP readings have been good Encourage patient to continue to monitor home BP, keep log of results and bring record to medical appointments  Medication Assistance: Note from previous discussion, based on reported income, patient does not meet income limit for [place] patient assistance program for Advair or patient assistance programs for therapeutic alternatives Again encourage patient to consider signing up for [place] waitlist  Patient denies further medication questions/concerns at this time. Confirms has phone number for CM Pharmacist and will call if needed.  Prefers next scheduled follow up with CM Pharmacist at end of year for medication assistance program re-enrollment  Patient Goals/Self-Care Activities Over the next  90 days, patient will:  - take medications as prescribed using weekly pillbox - check blood pressure, document, and provide at future appointments - attend medical appointment as scheduled  Next appointment with PCP 11/02/2021  Follow Up Plan: Telephone follow up appointment with care management team member scheduled for: 12/7 for medication assistance follow up      [facility]  [id number] [place] [place] [address] [id number]: [phone]: [phone]  Follow Up:  Patient agrees to Care Plan and Follow-up.  EWallace Cullens PharmD, BPara March CPP Clinical Pharmacist [phone]

## 2021-07-15 NOTE — Patient Instructions (Signed)
Visit Information  PATIENT GOALS:  Goals Addressed             This Visit's Progress    Pharmacy Goals       Our goal bad cholesterol, or LDL, is less than 70 . This is why it is important to continue taking your atorvastatin.  Please check your home blood pressure, keep a log of the results and bring this with you to your medical appointments.   Feel free to call me with any questions or concerns. I look forward to our next call!  Wallace Cullens, PharmD, Para [place], CPP Clinical Pharmacist [facility] [phone]        The patient verbalized understanding of instructions, educational materials, and care plan provided today and declined offer to receive copy of patient instructions, educational materials, and care plan.   Telephone follow up appointment with care management team member scheduled for: 12/7

## 2021-07-22 ENCOUNTER — Encounter: Payer: Self-pay | Admitting: Internal Medicine

## 2021-07-22 ENCOUNTER — Other Ambulatory Visit: Payer: Self-pay

## 2021-07-22 ENCOUNTER — Ambulatory Visit (INDEPENDENT_AMBULATORY_CARE_PROVIDER_SITE_OTHER): Payer: Medicare HMO | Admitting: Internal Medicine

## 2021-07-22 VITALS — BP 111/63 | HR 85 | Temp 97.7°F | Resp 17 | Ht 63.0 in | Wt 263.4 lb

## 2021-07-22 DIAGNOSIS — R5383 Other fatigue: Secondary | ICD-10-CM | POA: Diagnosis not present

## 2021-07-22 DIAGNOSIS — M2550 Pain in unspecified joint: Secondary | ICD-10-CM | POA: Diagnosis not present

## 2021-07-22 DIAGNOSIS — G4733 Obstructive sleep apnea (adult) (pediatric): Secondary | ICD-10-CM

## 2021-07-22 DIAGNOSIS — R7982 Elevated C-reactive protein (CRP): Secondary | ICD-10-CM

## 2021-07-22 DIAGNOSIS — G4719 Other hypersomnia: Secondary | ICD-10-CM

## 2021-07-22 NOTE — Progress Notes (Signed)
Subjective:    Patient ID: Maria Franklin, female    DOB: 1961-04-05, 60 y.o.   MRN: [id number]  HPI  Patient presents the clinic today with complaint of fatigue, excessive daytime sleepiness and multiple joint pain. She reports this started 1-2 years ago. She sleeps well at night, without the use of her CPAP. Her last sleep study was 11/2019. She is napping during the day, she feels like she has to in order to function. She is not snoring at night. She feels like her mood is great, denies anxiety and depression. She reports the pain is in her left shoulder, bilateral hips, knee and feet. She describes the pain as sore and achy. She is having muscle cramps intermittently but denies muscle pain. She denies numbness, tingling or weakness. She has tried Meloxicam with some relief but has known CKD.  Review of Systems     Past Medical History:  Diagnosis Date   Allergy    Anemia    Angio-edema    Asthma    ALL THE TIME   LAST FLARE UP 12/2018   Chest pain 08/2019   Chicken pox    Diabetes mellitus without complication (HCC)    GERD (gastroesophageal reflux disease)    Hyperlipidemia    Hypertension    Hyperthyroidism    Recurrent upper respiratory infection (URI)    Schizophrenia (HCC)    Sleep apnea    DX 2-3 YRS AGO.   Sleep apnea    Thyroid disease    Urticaria     Current Outpatient Medications  Medication Sig Dispense Refill   ADVAIR DISKUS 250-50 MCG/ACT AEPB Inhale 1 puff into the lungs in the morning and at bedtime. 60 each 5   albuterol (VENTOLIN HFA) 108 (90 Base) MCG/ACT inhaler INHALE 1 TO 2 PUFFS INTO THE LUNGS EVERY 6 HOURS AS NEEDED WHEEZING/SHORTNESS OF BREATH 18 each 1   amLODipine (NORVASC) 10 MG tablet TAKE 1 TABLET BY MOUTH EVERY DAY 90 tablet 3   aspirin EC 81 MG EC tablet Take 1 tablet (81 mg total) by mouth daily. 30 tablet 0   atorvastatin (LIPITOR) 40 MG tablet Take 1 tablet (40 mg total) by mouth daily at 6 PM. 90 tablet 3   b complex vitamins capsule  Take 1 capsule by mouth daily.     Boswellia-Glucosamine-Vit D (OSTEO BI-FLEX ONE PER DAY PO) Take by mouth.     carvedilol (COREG) 12.5 MG tablet Take 12.5 mg by mouth 2 (two) times daily.     cetirizine (ZYRTEC) 10 MG tablet Take 1 tablet (10 mg total) by mouth daily. 30 tablet 5   Ferrous Sulfate (IRON) 325 (65 Fe) MG TABS Take 1 tablet by mouth every morning.     fluticasone (FLONASE) 50 MCG/ACT nasal spray Place 2 sprays into both nostrils daily. 16 g 5   haloperidol (HALDOL) 5 MG tablet Take 0.5 tablets (2.5 mg total) by mouth at bedtime. Take 1/2 tab at bed time 30 tablet 2   hydrALAZINE (APRESOLINE) 25 MG tablet Take 0.5 tablets (12.5 mg total) by mouth in the morning and at bedtime. 180 tablet 3   levothyroxine (SYNTHROID) 50 MCG tablet TAKE 1 TABLET BY MOUTH EVERY DAY BEFORE BREAKFAST 90 tablet 1   lisinopril (ZESTRIL) 40 MG tablet TAKE 1 TABLET BY MOUTH EVERY DAY 90 tablet 3   meloxicam (MOBIC) 15 MG tablet Take 15 mg by mouth daily as needed.     Multiple Vitamins-Minerals (MULTIVITAMIN WITH MINERALS) tablet Take  1 tablet by mouth every evening.     omeprazole (PRILOSEC) 40 MG capsule Take 1 capsule by mouth daily.     Semaglutide (OZEMPIC, 0.25 OR 0.5 MG/DOSE, [place]) Inject 0.5 mg into the skin once a week. 0.25 mg once weekly for 4 weeks, then increase to 0.5 mg once weekly     No current facility-administered medications for this visit.    Allergies  Allergen Reactions   Bee Pollen Cough   Pollen Extract Cough   Latex Itching, Rash and Other (See Comments)    Severe itching    Family History  Problem Relation Age of Onset   Mental illness Mother    Hypertension Mother    Schizophrenia Mother    Depression Mother    Heart disease Mother    Diabetes Mother    Cancer Father        LUNG   Cancer Sister        BREAST   Breast cancer Sister    Mental illness Brother    Hypertension Brother    Schizophrenia Brother    Depression Brother    Heart disease Maternal  Grandmother    Depression Maternal Grandmother    Schizophrenia Maternal Grandmother    Schizophrenia Brother    Schizophrenia Maternal Uncle    Breast cancer Maternal Aunt    Heart attack Sister    Heart attack Sister    Heart attack Sister    Allergic rhinitis Neg Hx    Angioedema Neg Hx    Asthma Neg Hx    Atopy Neg Hx    Eczema Neg Hx    Immunodeficiency Neg Hx    Urticaria Neg Hx     Social History   Socioeconomic History   Marital status: Married    Spouse name: Not on file   Number of children: Not on file   Years of education: Not on file   Highest education level: Not on file  Occupational History   Not on file  Tobacco Use   Smoking status: Former    Packs/day: 0.25    Years: 6.00    Pack years: 1.50    Types: Cigarettes   Smokeless tobacco: Never   Tobacco comments:    QUIT IN HER EARLY 20'S  Vaping Use   Vaping Use: Never used  Substance and Sexual Activity   Alcohol use: No    Alcohol/week: 0.0 standard drinks   Drug use: No   Sexual activity: Not Currently  Other Topics Concern   Not on file  Social History Narrative   Pt is R handed   Lives in single story home with her husband and father-in-law   Has 4 children   Associates Degree x2   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 [place] Determinants of Health   Financial Resource Strain: Low Risk    Difficulty of Paying Living Expenses: Not hard at all  Food Insecurity: No Food Insecurity   Worried About Charity fundraiser in the Last Year: Never true   Arboriculturist in the Last Year: Never true  Transportation Needs: No Transportation Needs   Lack of Transportation (Medical): No   Lack of Transportation (Non-Medical): No  Physical Activity: Inactive   Days of Exercise per Week: 0 days   Minutes of Exercise per Session: 0 min  Stress: No Stress Concern Present   Feeling of Stress : Not at all  Social  Connections: Socially Integrated    Frequency of Communication with Friends and Family: More than three times a week   Frequency of Social Gatherings with Friends and Family: Once a week   Attends Religious Services: More than 4 times per year   Active Member of [employer] or Organizations: Yes   Attends Music therapist: More than 4 times per year   Marital Status: Married  Human resources officer Violence: Not At Risk   Fear of Current or Ex-Partner: No   Emotionally Abused: No   Physically Abused: No   Sexually Abused: No     Constitutional: Patient reports fatigue.  Denies fever, malaise, headache or abrupt weight changes.  Respiratory: Denies difficulty breathing, shortness of breath, cough or sputum production.   Cardiovascular: Denies chest pain, chest tightness, palpitations or swelling in the hands or feet.  Musculoskeletal: Pt reports multiple joint pain. Denies decrease in range of motion, difficulty with gait, muscle pain or joint swelling.  Skin: Denies redness, rashes, lesions or ulcercations.  Neurological: Pt reports excessive daytime sleepiness. Denies dizziness, difficulty with memory, difficulty with speech or problems with balance and coordination.  Psych: Denies anxiety, depression, SI/HI.  No other specific complaints in a complete review of systems (except as listed in HPI above).  Objective:   Physical Exam  BP 111/63 (BP Location: Right Arm, Patient Position: Sitting, Cuff Size: Large)   Pulse 85   Temp 97.7 F (36.5 C) (Temporal)   Resp 17   Ht $R'5\' 3"'mL$  (1.6 m)   Wt 263 lb 6.4 oz (119.5 kg)   LMP 02/02/2017 (Approximate)   SpO2 100%   BMI 46.66 kg/m   Wt Readings from Last 3 Encounters:  05/05/21 264 lb 6.4 oz (119.9 kg)  04/23/21 267 lb 6.4 oz (121.3 kg)  01/23/21 261 lb 6.4 oz (118.6 kg)    General: Appears her stated age, obese, in NAD. Skin: Warm, dry and intact. No rashes noted. HEENT: Head: normal shape and size; Eyes: sclera white and EOMs intact;  Neck:  Neck supple,  trachea midline. No masses, lumps present.  Cardiovascular: Normal rate and rhythm. S1,S2 noted.  No murmur, rubs or gallops noted.  Pulmonary/Chest: Normal effort and positive vesicular breath sounds. No respiratory distress. No wheezes, rales or ronchi noted.  Musculoskeletal: Normal internal and external rotation of bilateral shoulders.  No pain with palpation of the shoulders.  Normal abduction, abduction, internal and external rotation of bilateral hips.  Normal flexion and extension of the knees.  No signs of joint swelling.  Strength 5/5 BUE/BLE.  No difficulty with gait.  Neurological: Alert and oriented.  Psychiatric: Mood and affect normal. Behavior is normal. Judgment and thought content normal.     BMET    Component Value Date/Time   NA 140 04/23/2021 1055   K 4.3 04/23/2021 1055   CL 105 04/23/2021 1055   CO2 27 04/23/2021 1055   GLUCOSE 132 (H) 04/23/2021 1055   BUN 20 04/23/2021 1055   CREATININE 1.20 (H) 04/23/2021 1055   CALCIUM 9.3 04/23/2021 1055   GFRNONAA 44 (L) 11/18/2020 0939   GFRNONAA 53 (L) 07/28/2020 0942   GFRAA 61 07/28/2020 0942    Lipid Panel     Component Value Date/Time   CHOL 170 04/23/2021 1055   TRIG 99 04/23/2021 1055   HDL 74 04/23/2021 1055   CHOLHDL 2.3 04/23/2021 1055   VLDL 18 10/17/2019 0630   LDLCALC 78 04/23/2021 1055    CBC    Component Value  Date/Time   WBC 8.0 04/23/2021 1055   RBC 4.03 04/23/2021 1055   HGB 10.8 (L) 04/23/2021 1055   HCT 35.5 04/23/2021 1055   PLT 256 04/23/2021 1055   MCV 88.1 04/23/2021 1055   MCH 26.8 (L) 04/23/2021 1055   MCHC 30.4 (L) 04/23/2021 1055   RDW 12.9 04/23/2021 1055   LYMPHSABS 2.6 11/18/2020 0939   MONOABS 0.5 11/18/2020 0939   EOSABS 0.4 11/18/2020 0939   BASOSABS 0.0 11/18/2020 0939    Hgb A1C Lab Results  Component Value Date   HGBA1C 6.3 (H) 04/23/2021          Assessment & Plan:   Fatigue, Excessive Daytime Sleepiness, OSA:  I reviewed her recent sleep study and  she does have OSA She reports she was told that she did not have to wear her CPAP but I wonder if this is contributing to her fatigue and daytime sleepiness Encouraged her to try using her CPAP over the next few weeks and see if symptoms improved  Multiple Joint Pain:  We will check ANA, ESR, RF and CRP Continue Meloxicam as needed Encourage regular stretching Encourage weight loss as this can help reduce joint pain  We will follow-up after labs, return precautions discussed  Webb Silversmith, NP This visit occurred during the SARS-CoV-2 public health emergency.  Safety protocols were in place, including screening questions prior to the visit, additional usage of staff PPE, and extensive cleaning of exam room while observing appropriate contact time as indicated for disinfecting solutions.

## 2021-07-24 ENCOUNTER — Other Ambulatory Visit: Payer: Self-pay | Admitting: Family Medicine

## 2021-07-24 DIAGNOSIS — I10 Essential (primary) hypertension: Secondary | ICD-10-CM

## 2021-07-24 DIAGNOSIS — E66813 Obesity, class 3: Secondary | ICD-10-CM | POA: Insufficient documentation

## 2021-07-24 LAB — SEDIMENTATION RATE: Sed Rate: 29 mm/h (ref 0–30)

## 2021-07-24 LAB — RHEUMATOID FACTOR: Rheumatoid fact SerPl-aCnc: <14 IU/mL (ref <14)

## 2021-07-24 LAB — C-REACTIVE PROTEIN: CRP: 13.1 mg/L — ABNORMAL HIGH (ref <8.0)

## 2021-07-24 LAB — ANA: Anti Nuclear Antibody (ANA): NEGATIVE

## 2021-07-24 NOTE — Patient Instructions (Signed)
Sleep Apnea Sleep apnea affects breathing during sleep. It causes breathing to stop for 10 seconds or more, or to become shallow. People with sleep apnea usually snoreloudly. It can also increase the risk of: Heart attack. Stroke. Being very overweight (obese). Diabetes. Heart failure. Irregular heartbeat. High blood pressure. The goal of treatment is to help you breathe normally again. What are the causes?  The most common cause of this condition is a collapsed or blocked airway. There are three kinds of sleep apnea: Obstructive sleep apnea. This is caused by a blocked or collapsed airway. Central sleep apnea. This happens when the brain does not send the right signals to the muscles that control breathing. Mixed sleep apnea. This is a combination of obstructive and central sleep apnea. What increases the risk? Being overweight. Smoking. Having a small airway. Being older. Being female. Drinking alcohol. Taking medicines to calm yourself (sedatives or tranquilizers). Having family members with the condition. Having a tongue or tonsils that are larger than normal. What are the signs or symptoms? Trouble staying asleep. Loud snoring. Headaches in the morning. Waking up gasping. Dry mouth or sore throat in the morning. Being sleepy or tired during the day. If you are sleepy or tired during the day, you may also: Not be able to focus your mind (concentrate). Forget things. Get angry a lot and have mood swings. Feel sad (depressed). Have changes in your personality. Have less interest in sex, if you are female. Be unable to have an erection, if you are female. How is this treated?  Sleeping on your side. Using a medicine to get rid of mucus in your nose (decongestant). Avoiding the use of alcohol, medicines to help you relax, or certain pain medicines (narcotics). Losing weight, if needed. Changing your diet. Quitting smoking. Using a machine to open your airway while you  sleep, such as: An oral appliance. This is a mouthpiece that shifts your lower jaw forward. A CPAP device. This device blows air through a mask when you breathe out (exhale). An EPAP device. This has valves that you put in each nostril. A BPAP device. This device blows air through a mask when you breathe in (inhale) and breathe out. Having surgery if other treatments do not work. Follow these instructions at home: Lifestyle Make changes that your doctor recommends. Eat a healthy diet. Lose weight if needed. Avoid alcohol, medicines to help you relax, and some pain medicines. Do not smoke or use any products that contain nicotine or tobacco. If you need help quitting, ask your doctor. General instructions Take over-the-counter and prescription medicines only as told by your doctor. If you were given a machine to use while you sleep, use it only as told by your doctor. If you are having surgery, make sure to tell your doctor you have sleep apnea. You may need to bring your device with you. Keep all follow-up visits. Contact a doctor if: The machine that you were given to use during sleep bothers you or does not seem to be working. You do not get better. You get worse. Get help right away if: Your chest hurts. You have trouble breathing in enough air. You have an uncomfortable feeling in your back, arms, or stomach. You have trouble talking. One side of your body feels weak. A part of your face is hanging down. These symptoms may be an emergency. Get help right away. Call your local emergency services (911 in the [place] Do not wait to see if the symptoms   will go away. Do not drive yourself to the hospital. Summary This condition affects breathing during sleep. The most common cause is a collapsed or blocked airway. The goal of treatment is to help you breathe normally while you sleep. This information is not intended to replace advice given to you by your health care provider. Make  sure you discuss any questions you have with your healthcare provider. Document Revised: 10/31/2020 Document Reviewed: 10/31/2020 Elsevier Patient Education  2022 Elsevier Inc.  

## 2021-07-24 NOTE — Assessment & Plan Note (Signed)
Encourage diet and exercise for weight loss 

## 2021-08-11 ENCOUNTER — Telehealth: Payer: Self-pay | Admitting: Internal Medicine

## 2021-08-11 NOTE — Telephone Encounter (Signed)
Pt is calling to schedule a lab appt. New template is not working. Advised to send a crm. Please advise CB- [phone]

## 2021-08-13 ENCOUNTER — Other Ambulatory Visit: Payer: Medicare HMO

## 2021-08-13 DIAGNOSIS — R7982 Elevated C-reactive protein (CRP): Secondary | ICD-10-CM | POA: Diagnosis not present

## 2021-08-14 ENCOUNTER — Encounter: Payer: Self-pay | Admitting: Internal Medicine

## 2021-08-14 DIAGNOSIS — M2550 Pain in unspecified joint: Secondary | ICD-10-CM

## 2021-08-14 DIAGNOSIS — R7982 Elevated C-reactive protein (CRP): Secondary | ICD-10-CM

## 2021-08-14 LAB — C-REACTIVE PROTEIN: CRP: 13.8 mg/L — ABNORMAL HIGH (ref <8.0)

## 2021-08-30 ENCOUNTER — Other Ambulatory Visit: Payer: Self-pay | Admitting: Family Medicine

## 2021-08-30 DIAGNOSIS — K219 Gastro-esophageal reflux disease without esophagitis: Secondary | ICD-10-CM

## 2021-08-31 ENCOUNTER — Other Ambulatory Visit: Payer: Self-pay | Admitting: Family Medicine

## 2021-09-08 ENCOUNTER — Other Ambulatory Visit: Payer: Self-pay | Admitting: Family Medicine

## 2021-09-09 ENCOUNTER — Other Ambulatory Visit: Payer: Self-pay | Admitting: Internal Medicine

## 2021-09-10 NOTE — Telephone Encounter (Signed)
Requested medication (s) are due for refill today:   Not sure  Requested medication (s) are on the active medication list:   Yes  Future visit scheduled:   Pt is seeing Webb Silversmith, NP now at [facility].   Not Dr. Grier Mitts at [facility].   Last ordered: 04/28/2021  Returned because this last prescribed by a historical provider, maybe Dr. Grier Mitts.   Requested Prescriptions  Pending Prescriptions Disp Refills   carvedilol (COREG) 12.5 MG tablet [Pharmacy Med Name: CARVEDILOL 12.5 MG TABLET] 180 tablet 3    Sig: TAKE 1 TABLET (12.5 MG TOTAL) BY MOUTH 2 (TWO) TIMES DAILY WITH A MEAL.     There is no refill protocol information for this order

## 2021-09-18 ENCOUNTER — Ambulatory Visit: Payer: Self-pay | Admitting: Pharmacist

## 2021-09-18 DIAGNOSIS — E119 Type 2 diabetes mellitus without complications: Secondary | ICD-10-CM

## 2021-09-18 NOTE — Chronic Care Management (AMB) (Signed)
Chronic Care Management Pharmacy Note  09/18/2021 Name:  Maria Franklin MRN:  [id number] DOB:  [birth year]   Subjective: Maria Franklin is an 60 y.o. year old female who is a primary patient of Jearld Fenton, NP.  The CCM team was consulted for assistance with disease management and care coordination needs.    Engaged with patient by telephone for follow up visit in response to provider referral for pharmacy case management and/or care coordination services.   Consent to Services:  The patient was given information about Chronic Care Management services, agreed to services, and gave verbal consent prior to initiation of services.  Please see initial visit note for detailed documentation.   Patient Care Team: Jearld Fenton, NP as PCP - General (Internal Medicine) Jettie Booze, MD as PCP - Cardiology (Cardiology) Curley Spice, Virl Diamond, RPH-CPP (Pharmacist)  Recent office visits: Office Visit with PCP on 8/17  Hospital visits: None in previous 6 months  Objective:  Lab Results  Component Value Date   CREATININE 1.20 (H) 04/23/2021   CREATININE 1.39 (H) 11/18/2020   CREATININE 1.14 (H) 07/28/2020    Lab Results  Component Value Date   HGBA1C 6.3 (H) 04/23/2021   Last diabetic Eye exam:  Lab Results  Component Value Date/Time   HMDIABEYEEXA No Retinopathy 09/09/2020 12:00 AM    Last diabetic Foot exam: No results found for: HMDIABFOOTEX   Social History   Tobacco Use  Smoking Status Former   Packs/day: 0.25   Years: 6.00   Pack years: 1.50   Types: Cigarettes  Smokeless Tobacco Never  Tobacco Comments   QUIT IN HER EARLY 20'S   BP Readings from Last 3 Encounters:  07/22/21 111/63  05/05/21 124/80  04/23/21 117/66   Pulse Readings from Last 3 Encounters:  07/22/21 85  05/05/21 82  04/23/21 100   Wt Readings from Last 3 Encounters:  07/22/21 263 lb 6.4 oz (119.5 kg)  05/06/21 264 lb (119.7 kg)  05/05/21 264 lb 6.4 oz (119.9 kg)     Assessment: Review of patient past medical history, allergies, medications, health status, including review of consultants reports, laboratory and other test data, was performed as part of comprehensive evaluation and provision of chronic care management services.   SDOH:  (Social Determinants of Health) assessments and interventions performed:    CCM Care Plan  Allergies  Allergen Reactions   Bee Pollen Cough   Pollen Extract Cough   Latex Itching, Rash and Other (See Comments)    Severe itching    Medications Reviewed Today     Reviewed by Rennis Petty, RPH-CPP (Pharmacist) on 09/18/21 at 55  Med List Status: <None>   Medication Order Taking? Sig Documenting Provider Last Dose Status Informant  ADVAIR DISKUS 250-50 MCG/ACT AEPB [id number]  Inhale 1 puff into the lungs in the morning and at bedtime. Valentina Shaggy, MD  Active   albuterol [facility] HFA) 108 [phone] Base) MCG/ACT inhaler [id number]  INHALE 1 TO 2 PUFFS INTO THE LUNGS EVERY 6 HOURS AS NEEDED WHEEZING/SHORTNESS OF Nathen May, MD  Active   amLODipine (NORVASC) 10 MG tablet [id number]  TAKE 1 TABLET BY MOUTH EVERY DAY Billie Ruddy, MD  Active   aspirin EC 81 MG EC tablet [id number]  Take 1 tablet (81 mg total) by mouth daily. Hosie Poisson, MD  Active   atorvastatin (LIPITOR) 40 MG tablet [id number]  Take 1 tablet (40 mg total) by mouth daily at 6 PM.  Billie Ruddy, MD  Active   b complex vitamins capsule [id number]  Take 1 capsule by mouth daily. [provider]  Active   Boswellia-Glucosamine-Vit D (OSTEO BI-FLEX ONE PER DAY PO) [id number]  Take by mouth. [provider]  Active   carvedilol (COREG) 12.5 MG tablet [id number]  TAKE 1 TABLET (12.5 MG TOTAL) BY MOUTH 2 (TWO) TIMES DAILY WITH A MEAL. Jearld Fenton, NP  Active   cetirizine (ZYRTEC) 10 MG tablet [id number]  Take 1 tablet (10 mg total) by mouth daily. Valentina Shaggy, MD  Active   Ferrous Sulfate (IRON)  325 (65 Fe) MG TABS [id number]  Take 1 tablet by mouth every morning. [provider]  Active   fluticasone (FLONASE) 50 MCG/ACT nasal spray [id number]  Place 2 sprays into both nostrils daily. Valentina Shaggy, MD  Active   haloperidol (HALDOL) 5 MG tablet [id number]  Take 0.5 tablets (2.5 mg total) by mouth at bedtime. Take 1/2 tab at bed time Arfeen, Arlyce Harman, MD  Active   hydrALAZINE (APRESOLINE) 25 MG tablet [id number]  Take 0.5 tablets (12.5 mg total) by mouth in the morning and at bedtime. Billie Ruddy, MD  Active   levothyroxine (SYNTHROID) 50 MCG tablet [id number]  TAKE 1 TABLET BY MOUTH EVERY DAY BEFORE BREAKFAST Billie Ruddy, MD  Active   lisinopril (ZESTRIL) 40 MG tablet [id number]  TAKE 1 TABLET BY MOUTH EVERY DAY Billie Ruddy, MD  Active   meloxicam (MOBIC) 15 MG tablet [id number]  Take 15 mg by mouth daily as needed. [provider]  Active   Multiple Vitamins-Minerals (MULTIVITAMIN WITH MINERALS) tablet [id number]  Take 1 tablet by mouth every evening. [provider]  Active   omeprazole (PRILOSEC) 40 MG capsule [id number]  TAKE 1 CAPSULE BY MOUTH EVERY DAY Jearld Fenton, NP  Active             Patient Active Problem List   Diagnosis Date Noted   Morbid obesity ([place]) 07/24/2021   Paranoid schizophrenia ([place]) 10/16/2019   CKD (chronic kidney disease), stage III ([place]) 08/29/2019   OSA (obstructive sleep apnea) 04/29/2016   GERD (gastroesophageal reflux disease) 10/28/2015   Asthma, mild persistent 04/21/2015   Type 2 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 disease, without long-term current use of insulin ([place]) 04/19/2014   Angina, [place] 12/30/2013   Hypertension 01/10/2013   Hyperlipidemia 01/10/2013   Hypothyroidism 01/10/2013    Immunization History  Administered Date(s) Administered   Influenza Inj Mdck Quad Pf 10/01/2020   Influenza,inj,Quad PF,6+ Mos 10/21/2014, 10/28/2015, 11/04/2016, 02/14/2018, 09/28/2018    PFIZER(Purple Top)SARS-COV-2 Vaccination 02/29/2020, 03/22/2020, 10/01/2020   Pneumococcal Polysaccharide-23 10/21/2014, 03/21/2019   Td 04/18/2016    Conditions to be addressed/monitored: Asthma, T2DM, HTN, HLD, hypothyroidism  Care Plan : Pharmacy - Med Mgmt/Assistance  Updates made by Rennis Petty, RPH-CPP since 09/18/2021 12:00 AM     Problem: Disease Progression      Long-Range Goal: Disease Progression Prevented or Minimized Completed 09/18/2021  Start Date: 04/24/2021  Expected End Date: 07/23/2021  Recent Progress: On track  Priority: High  Note:   Current Barriers:  Chronic Disease Management support, education, and care coordination needs related to HTN, HLD, GERD, asthma, CKD and schizophrenia Unable to independently afford treatment regimen Reports difficulty with affording cost of Advair and albuterol inhalers and Ozempic through [employer] health plan Patient APPROVED for [place] patient assistance from [employer] through 12/05/2021  Pharmacist Clinical Goal(s):  Over  the next 90 days, patient will verbalize ability to afford treatment regimen through collaboration with PharmD and provider.   Interventions: 1:1 collaboration with Jearld Fenton, NP regarding development and update of comprehensive plan of care as evidenced by provider attestation and co-signature Inter-disciplinary care team collaboration (see longitudinal plan of care) Perform chart review. Patient seen for Office Visit with PCP on 8/17  T2DM/weight loss/medication assistance: Outreach to patient today to follow up regarding re-enrollment for Ozempic through [employer] patient assistance program for 2023 calendar year Patient denies interest in re-enrollment Reports has stopped taking Ozempic as did not like the way that it made her feel Will let PCP know and update medication list accordingly Advise patient to follow up with [employer] patient assistance program regarding  holding further auto-refills for remainder of calendar year.  Patient denies further medication questions/concerns at this time. Confirms has phone number for CM Pharmacist and will call if needed.  Patient Goals/Self-Care Activities Over the next 90 days, patient will:  - take medications as prescribed using weekly pillbox - check blood pressure, document, and provide at future appointments - attend medical appointment as scheduled  Follow Up Plan: Patient denies further medication questions/concerns or need to schedule follow up with CM Pharmacist at this time Confirms has phone number for CM Pharmacist and will call if needed.       Patient's preferred pharmacy is:  [id number] - , [place] [address] [place] [address] Phone: [phone] Fax: [phone]   Follow Up:  Patient requests no follow-up at this time.  Wallace Cullens, PharmD, Para [place], CPP Clinical Pharmacist [facility] [phone]

## 2021-09-18 NOTE — Patient Instructions (Signed)
Visit Information  PATIENT GOALS:  Goals Addressed             This Visit's Progress    Pharmacy Goals       Our goal bad cholesterol, or LDL, is less than 70 . This is why it is important to continue taking your atorvastatin.  Please check your home blood pressure, keep a log of the results and bring this with you to your medical appointments.   Feel free to call me with any questions or concerns.  Wallace Cullens, PharmD, Para [place], CPP Clinical Pharmacist [facility] [phone]        The patient verbalized understanding of instructions, educational materials, and care plan provided today and declined offer to receive copy of patient instructions, educational materials, and care plan.   Patient denies further medication questions/concerns or need to schedule follow up with CM Pharmacist at this time  Confirms has phone number for CM Pharmacist and will call if needed

## 2021-09-19 ENCOUNTER — Other Ambulatory Visit: Payer: Self-pay | Admitting: Family Medicine

## 2021-09-19 DIAGNOSIS — E039 Hypothyroidism, unspecified: Secondary | ICD-10-CM

## 2021-09-21 NOTE — Progress Notes (Signed)
Office Visit Note  Patient: Maria Franklin             Date of Birth: 03/17/1961           MRN: [id number]             PCP: Lorre Munroe, NP Referring: Lorre Munroe, NP Visit Date: 09/22/2021 Occupation: Previously office work  Subjective:  New Patient (Initial Visit) (Total body joint pain and stiffness, abnormal labs)   History of Present Illness: Maria Franklin is a 60 y.o. female here for evaluation of chronic joint pains and elevated CRP.  She has had some plantar fasciitis and degenerative arthritis in the back pain issues but the current problem started about a year ago.  She felt increased joint pain and stiffness in numerous areas of the body accompanied by severe fatigue.  In particular pain in the hips knees feet and ankles with severe and significantly limiting activity.  She was seen by orthopedic surgery clinic and underwent local steroid injection at the left knee and right foot for apparent inflammation at that time.  She started some oral supplement treatment with Osteo Bi-Flex and turmeric daily these did not provide complete relief but were in noticeable benefit in symptom severity.  Laboratory tests were negative for obvious autoimmune markers or hyperuricemia though did demonstrate a persistent mild CRP elevation.  For about the past 3 weeks symptoms have been improving with decreased pain severity and decreased fatigue she does not recall any preceding changes or new treatments with this. She is currently experiencing increased allergic rhinitis type symptoms with some swelling and watering of the eyes increased by her son leaving a dog at her house to which she is allergic.  She previously did not tolerate allergy shots for chronic allergic rhinitis symptoms and long term moderate persistent asthma inhaler treatment.  Labs reviewed 08/2021 ANA neg RF neg ESR 29 CRP 13.1, 13.8 Uric acid 6.5  Activities of Daily Living:  Patient reports morning stiffness for 24  hours.   Patient Denies nocturnal pain.  Difficulty dressing/grooming: Reports Difficulty climbing stairs: Reports Difficulty getting out of chair: Reports Difficulty using hands for taps, buttons, cutlery, and/or writing: Denies  Review of Systems  Constitutional:  Negative for fatigue.  HENT:  Negative for mouth dryness.   Eyes:  Positive for dryness.  Respiratory:  Negative for shortness of breath.   Cardiovascular:  Positive for swelling in legs/feet.  Gastrointestinal:  Negative for constipation.  Endocrine: Negative for increased urination.  Genitourinary:  Negative for difficulty urinating.  Musculoskeletal:  Positive for joint pain, joint pain, joint swelling and morning stiffness.  Skin:  Positive for rash.  Allergic/Immunologic: Negative for susceptible to infections.  Neurological:  Negative for numbness.  Hematological:  Negative for bruising/bleeding tendency.  Psychiatric/Behavioral:  Negative for sleep disturbance.    PMFS History:  Patient Active Problem List   Diagnosis Date Noted   Elevated C-reactive protein (CRP) 09/22/2021   Morbid obesity (HCC) 07/24/2021   Pain in left knee 11/28/2019   Paranoid schizophrenia (HCC) 10/16/2019   CKD (chronic kidney disease), stage III (HCC) 08/29/2019   Moderate persistent asthma 07/06/2019   Nasal turbinate hypertrophy 01/17/2019   Allergic rhinitis 10/16/2018   GERD (gastroesophageal reflux disease) 10/28/2015   Asthma, mild persistent 04/21/2015   Type 2 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 disease, without long-term current use of insulin (HCC) 04/19/2014   Hypertension 01/10/2013   Hyperlipidemia 01/10/2013   Hypothyroidism 01/10/2013  Daytime somnolence 10/03/2009   Asthma 03/13/2009    Past Medical History:  Diagnosis Date   Allergy    Anemia    Angio-edema    Asthma    ALL THE TIME   LAST FLARE UP 12/2018   Chest pain 08/2019   Chicken pox    Diabetes mellitus without complication (HCC)     GERD (gastroesophageal reflux disease)    Hyperlipidemia    Hypertension    Hyperthyroidism    Recurrent upper respiratory infection (URI)    Schizophrenia ([place])    Sleep apnea    DX 2-3 YRS AGO.   Sleep apnea    Thyroid disease    Urticaria     Family History  Problem Relation Age of Onset   Mental illness Mother    Hypertension Mother    Schizophrenia Mother    Depression Mother    Heart disease Mother    Diabetes Mother    Cancer Father        LUNG   Hypertension Sister    Cancer Sister        BREAST   Breast cancer Sister    Hypertension Sister    Heart attack Sister    Hypertension Sister    Heart attack Sister    Mental illness Brother    Hypertension Brother    Schizophrenia Brother    Depression Brother    Hypertension Brother    Schizophrenia Brother    Hypertension Brother    Breast cancer Maternal Aunt    Schizophrenia Maternal Uncle    Heart disease Maternal Grandmother    Depression Maternal Grandmother    Schizophrenia Maternal Grandmother    Allergic rhinitis Neg Hx    Angioedema Neg Hx    Asthma Neg Hx    Atopy Neg Hx    Eczema Neg Hx    Immunodeficiency Neg Hx    Urticaria Neg Hx    Past Surgical History:  Procedure Laterality Date   left adrenal gland removal  07/2019   SINOSCOPY     SINUS ENDO W/FUSION Bilateral 01/05/2019   Procedure: ENDOSCOPIC SINUS SURGERY WITH NAVIGATION;  Surgeon: Jerrell Belfast, MD;  Location: [place];  Service: ENT;  Laterality: Bilateral;   SINUS IRRIGATION     TONSILLECTOMY     TUBAL LIGATION     TURBINATE REDUCTION N/A 01/05/2019   Procedure: Turbinate Reduction;  Surgeon: Jerrell Belfast, MD;  [facility];  Service: ENT;  Laterality: N/A;   Social History   Social History Narrative   Pt is R handed   Lives in single story home with her husband and father-in-law   Has 4 children   Associates Degree x2   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 [place] History  Administered Date(s) Administered   Influenza Inj Mdck Quad Pf 10/01/2020   Influenza,inj,Quad PF,6+ Mos 10/21/2014, 10/28/2015, 11/04/2016, 02/14/2018, 09/28/2018   PFIZER(Purple Top)SARS-COV-2 Vaccination 02/29/2020, 03/22/2020, 10/01/2020   Pneumococcal Polysaccharide-23 10/21/2014, 03/21/2019   Td 04/18/2016     Objective: Vital Signs: BP 113/72 (BP Location: Right Arm, Patient Position: Sitting, Cuff Size: Large)   Pulse 73   Resp 16   Ht $R'5\' 2"'tz$  (1.575 m)   Wt 262 lb (118.8 kg)   LMP 02/02/2017 (Approximate)   BMI 47.92 kg/m    Physical Exam Constitutional:      Appearance: She is obese.  HENT:     Right Ear: External ear normal.  Left Ear: External ear normal.     Mouth/Throat:     Mouth: Mucous membranes are moist.     Pharynx: Oropharynx is clear.  Eyes:     Conjunctiva/sclera: Conjunctivae normal.     Comments: Watering Mild right periorbital swelling  Cardiovascular:     Rate and Rhythm: Normal rate and regular rhythm.  Pulmonary:     Effort: Pulmonary effort is normal.     Breath sounds: Normal breath sounds.  Musculoskeletal:     Right lower leg: No edema.     Left lower leg: No edema.  Skin:    General: Skin is warm and dry.     Findings: No rash.  Neurological:     General: No focal deficit present.     Mental Status: She is alert.     Deep Tendon Reflexes: Reflexes normal.  Psychiatric:        Mood and Affect: Mood normal.     Musculoskeletal Exam:  Neck full ROM no tenderness Shoulders full ROM no tenderness or swelling Elbows full ROM no tenderness or swelling Wrists full ROM no tenderness or swelling Fingers full ROM no tenderness or swelling Knees full ROM no tenderness or swelling Ankles full ROM no tenderness or swelling Bilateral 1st MTP bunions and lateral deviation   Investigation: No additional findings.  Imaging: No results found.  Recent Labs: Lab Results  Component Value Date   WBC 8.0  04/23/2021   HGB 10.8 (L) 04/23/2021   PLT 256 04/23/2021   NA 140 04/23/2021   K 4.3 04/23/2021   CL 105 04/23/2021   CO2 27 04/23/2021   GLUCOSE 132 (H) 04/23/2021   BUN 20 04/23/2021   CREATININE 1.20 (H) 04/23/2021   BILITOT 0.3 04/23/2021   ALKPHOS 89 10/17/2019   AST 19 04/23/2021   ALT 21 04/23/2021   PROT 6.9 04/23/2021   ALBUMIN 3.8 10/17/2019   CALCIUM 9.3 04/23/2021   GFRAA 61 07/28/2020    Speciality Comments: No specialty comments available.  Procedures:  No procedures performed Allergies: Bee pollen, Pollen extract, and Latex   Assessment / Plan:     Visit Diagnoses: Elevated C-reactive protein (CRP)  Moderately increased CRP on testing last month but no specific inflammatory changes present on exam today and clinical history is unclear. Symptoms are improving at this time so I do not recommend resuming any type of antiinflammatory treatment empirically. Patients with morbid obesity body habitus may also demonstrate baseline CPR elevations in about 30% or more of cases so not completely sure this is an acute change. I recommend f/u PRN if symptoms return to higher severity to reassess at that time.  Chronic pain of left knee  Joint pains appears to have mild osteoarthritis at this time discussed conservative treatment including continued exercise and can reduce weightbearing if foot pain is limiting, such as pool exercises, recumbent biking, elliptical machine, yoga. She is already doing plantar fasciitis stretches at home.  Orders: No orders of the defined types were placed in this encounter.  No orders of the defined types were placed in this encounter.    Follow-Up Instructions: Return if symptoms worsen or fail to improve.   Collier Salina, MD  Note - This record has been created using [employer].  Chart creation errors have been sought, but may not always  have been located. Such creation errors do not reflect on  the standard of medical  care.

## 2021-09-22 ENCOUNTER — Encounter: Payer: Self-pay | Admitting: Internal Medicine

## 2021-09-22 ENCOUNTER — Ambulatory Visit: Payer: Medicare HMO | Admitting: Internal Medicine

## 2021-09-22 ENCOUNTER — Other Ambulatory Visit: Payer: Self-pay

## 2021-09-22 VITALS — BP 113/72 | HR 73 | Resp 16 | Ht 62.0 in | Wt 262.0 lb

## 2021-09-22 DIAGNOSIS — M25562 Pain in left knee: Secondary | ICD-10-CM | POA: Diagnosis not present

## 2021-09-22 DIAGNOSIS — G8929 Other chronic pain: Secondary | ICD-10-CM

## 2021-09-22 DIAGNOSIS — R7982 Elevated C-reactive protein (CRP): Secondary | ICD-10-CM

## 2021-10-02 DIAGNOSIS — E119 Type 2 diabetes mellitus without complications: Secondary | ICD-10-CM | POA: Diagnosis not present

## 2021-10-02 LAB — HM DIABETES EYE EXAM

## 2021-10-22 ENCOUNTER — Encounter: Payer: Medicare HMO | Admitting: Internal Medicine

## 2021-10-27 ENCOUNTER — Other Ambulatory Visit: Payer: Self-pay | Admitting: Family Medicine

## 2021-10-27 DIAGNOSIS — I10 Essential (primary) hypertension: Secondary | ICD-10-CM

## 2021-11-02 ENCOUNTER — Other Ambulatory Visit: Payer: Self-pay

## 2021-11-02 ENCOUNTER — Ambulatory Visit (INDEPENDENT_AMBULATORY_CARE_PROVIDER_SITE_OTHER): Payer: Medicare HMO | Admitting: Internal Medicine

## 2021-11-02 ENCOUNTER — Encounter: Payer: Self-pay | Admitting: Internal Medicine

## 2021-11-02 VITALS — BP 101/56 | HR 67 | Temp 97.7°F | Resp 18 | Ht 62.0 in | Wt 261.0 lb

## 2021-11-02 DIAGNOSIS — I10 Essential (primary) hypertension: Secondary | ICD-10-CM | POA: Diagnosis not present

## 2021-11-02 DIAGNOSIS — Z0001 Encounter for general adult medical examination with abnormal findings: Secondary | ICD-10-CM | POA: Diagnosis not present

## 2021-11-02 DIAGNOSIS — B351 Tinea unguium: Secondary | ICD-10-CM | POA: Diagnosis not present

## 2021-11-02 DIAGNOSIS — E785 Hyperlipidemia, unspecified: Secondary | ICD-10-CM | POA: Diagnosis not present

## 2021-11-02 DIAGNOSIS — Z780 Asymptomatic menopausal state: Secondary | ICD-10-CM | POA: Diagnosis not present

## 2021-11-02 NOTE — Patient Instructions (Signed)
Health Maintenance for Postmenopausal Women ?Menopause is a normal process in which your ability to get pregnant comes to an end. This process happens slowly over many months or years, usually between the ages of 48 and 55. Menopause is complete when you have missed your menstrual period for 12 months. ?It is important to talk with your health care provider about some of the most common conditions that affect women after menopause (postmenopausal women). These include heart disease, cancer, and bone loss (osteoporosis). Adopting a healthy lifestyle and getting preventive care can help to promote your health and wellness. The actions you take can also lower your chances of developing some of these common conditions. ?What are the signs and symptoms of menopause? ?During menopause, you may have the following symptoms: ?Hot flashes. These can be moderate or severe. ?Night sweats. ?Decrease in sex drive. ?Mood swings. ?Headaches. ?Tiredness (fatigue). ?Irritability. ?Memory problems. ?Problems falling asleep or staying asleep. ?Talk with your health care provider about treatment options for your symptoms. ?Do I need hormone replacement therapy? ?Hormone replacement therapy is effective in treating symptoms that are caused by menopause, such as hot flashes and night sweats. ?Hormone replacement carries certain risks, especially as you become older. If you are thinking about using estrogen or estrogen with progestin, discuss the benefits and risks with your health care provider. ?How can I reduce my risk for heart disease and stroke? ?The risk of heart disease, heart attack, and stroke increases as you age. One of the causes may be a change in the body's hormones during menopause. This can affect how your body uses dietary fats, triglycerides, and cholesterol. Heart attack and stroke are medical emergencies. There are many things that you can do to help prevent heart disease and stroke. ?Watch your blood pressure ?High  blood pressure causes heart disease and increases the risk of stroke. This is more likely to develop in people who have high blood pressure readings or are overweight. ?Have your blood pressure checked: ?Every 3-5 years if you are 18-39 years of age. ?Every year if you are 40 years old or older. ?Eat a healthy diet ? ?Eat a diet that includes plenty of vegetables, fruits, low-fat dairy products, and lean protein. ?Do not eat a lot of foods that are high in solid fats, added sugars, or sodium. ?Get regular exercise ?Get regular exercise. This is one of the most important things you can do for your health. Most adults should: ?Try to exercise for at least 150 minutes each week. The exercise should increase your heart rate and make you sweat (moderate-intensity exercise). ?Try to do strengthening exercises at least twice each week. Do these in addition to the moderate-intensity exercise. ?Spend less time sitting. Even light physical activity can be beneficial. ?Other tips ?Work with your health care provider to achieve or maintain a healthy weight. ?Do not use any products that contain nicotine or tobacco. These products include cigarettes, chewing tobacco, and vaping devices, such as e-cigarettes. If you need help quitting, ask your health care provider. ?Know your numbers. Ask your health care provider to check your cholesterol and your blood sugar (glucose). Continue to have your blood tested as directed by your health care provider. ?Do I need screening for cancer? ?Depending on your health history and family history, you may need to have cancer screenings at different stages of your life. This may include screening for: ?Breast cancer. ?Cervical cancer. ?Lung cancer. ?Colorectal cancer. ?What is my risk for osteoporosis? ?After menopause, you may be   at increased risk for osteoporosis. Osteoporosis is a condition in which bone destruction happens more quickly than new bone creation. To help prevent osteoporosis or  the bone fractures that can happen because of osteoporosis, you may take the following actions: ?If you are 19-50 years old, get at least 1,000 mg of calcium and at least 600 international units (IU) of vitamin D per day. ?If you are older than age 50 but younger than age 70, get at least 1,200 mg of calcium and at least 600 international units (IU) of vitamin D per day. ?If you are older than age 70, get at least 1,200 mg of calcium and at least 800 international units (IU) of vitamin D per day. ?Smoking and drinking excessive alcohol increase the risk of osteoporosis. Eat foods that are rich in calcium and vitamin D, and do weight-bearing exercises several times each week as directed by your health care provider. ?How does menopause affect my mental health? ?Depression may occur at any age, but it is more common as you become older. Common symptoms of depression include: ?Feeling depressed. ?Changes in sleep patterns. ?Changes in appetite or eating patterns. ?Feeling an overall lack of motivation or enjoyment of activities that you previously enjoyed. ?Frequent crying spells. ?Talk with your health care provider if you think that you are experiencing any of these symptoms. ?General instructions ?See your health care provider for regular wellness exams and vaccines. This may include: ?Scheduling regular health, dental, and eye exams. ?Getting and maintaining your vaccines. These include: ?Influenza vaccine. Get this vaccine each year before the flu season begins. ?Pneumonia vaccine. ?Shingles vaccine. ?Tetanus, diphtheria, and pertussis (Tdap) booster vaccine. ?Your health care provider may also recommend other immunizations. ?Tell your health care provider if you have ever been abused or do not feel safe at home. ?Summary ?Menopause is a normal process in which your ability to get pregnant comes to an end. ?This condition causes hot flashes, night sweats, decreased interest in sex, mood swings, headaches, or lack  of sleep. ?Treatment for this condition may include hormone replacement therapy. ?Take actions to keep yourself healthy, including exercising regularly, eating a healthy diet, watching your weight, and checking your blood pressure and blood sugar levels. ?Get screened for cancer and depression. Make sure that you are up to date with all your vaccines. ?This information is not intended to replace advice given to you by your health care provider. Make sure you discuss any questions you have with your health care provider. ?Document Revised: 04/13/2021 Document Reviewed: 04/13/2021 ?Elsevier Patient Education ? 2022 [employer] ? ?

## 2021-11-02 NOTE — Progress Notes (Signed)
Subjective:    Patient ID: Maria Franklin, female    DOB: 03/31/1961, 60 y.o.   MRN: [id number]  HPI  Pt presents to the clinic today for her annual exam.  Flu: 09/2021 Tetanus: 04/2016 Covid: Pfizer x 4 Pneumovax: 03/2019 Shingrix: never Pap smear: 07/2019 Mammogram: 01/2021 Bone density: never Colon screening: 12/2011 Vision screening: annual, [facility]ist: biannually  Diet: She does eat meat. She consumes some fruits and veggies. She does eat some fried foods. She drinks mostly coffee and water. Exercise: None  Review of Systems  Past Medical History:  Diagnosis Date   Allergy    Anemia    Angio-edema    Asthma    ALL THE TIME   LAST FLARE UP 12/2018   Chest pain 08/2019   Chicken pox    Diabetes mellitus without complication (HCC)    GERD (gastroesophageal reflux disease)    Hyperlipidemia    Hypertension    Hyperthyroidism    Recurrent upper respiratory infection (URI)    Schizophrenia (HCC)    Sleep apnea    DX 2-3 YRS AGO.   Sleep apnea    Thyroid disease    Urticaria     Current Outpatient Medications  Medication Sig Dispense Refill   ADVAIR DISKUS 250-50 MCG/ACT AEPB Inhale 1 puff into the lungs in the morning and at bedtime. 60 each 5   albuterol (VENTOLIN HFA) 108 (90 Base) MCG/ACT inhaler INHALE 1 TO 2 PUFFS INTO THE LUNGS EVERY 6 HOURS AS NEEDED WHEEZING/SHORTNESS OF BREATH 18 each 1   amLODipine (NORVASC) 10 MG tablet TAKE 1 TABLET BY MOUTH EVERY DAY 90 tablet 3   aspirin EC 81 MG EC tablet Take 1 tablet (81 mg total) by mouth daily. 30 tablet 0   atorvastatin (LIPITOR) 40 MG tablet Take 1 tablet (40 mg total) by mouth daily at 6 PM. 90 tablet 3   b complex vitamins capsule Take 1 capsule by mouth daily.     Boswellia-Glucosamine-Vit D (OSTEO BI-FLEX ONE PER DAY PO) Take by mouth.     carvedilol (COREG) 12.5 MG tablet TAKE 1 TABLET (12.5 MG TOTAL) BY MOUTH 2 (TWO) TIMES DAILY WITH A MEAL. 180 tablet 1   cetirizine (ZYRTEC) 10 MG tablet  Take 1 tablet (10 mg total) by mouth daily. 30 tablet 5   Ferrous Sulfate (IRON) 325 (65 Fe) MG TABS Take 1 tablet by mouth every morning.     fluticasone (FLONASE) 50 MCG/ACT nasal spray Place 2 sprays into both nostrils daily. 16 g 5   haloperidol (HALDOL) 5 MG tablet Take 0.5 tablets (2.5 mg total) by mouth at bedtime. Take 1/2 tab at bed time 30 tablet 2   hydrALAZINE (APRESOLINE) 25 MG tablet Take 0.5 tablets (12.5 mg total) by mouth in the morning and at bedtime. 180 tablet 3   levothyroxine (SYNTHROID) 50 MCG tablet TAKE 1 TABLET BY MOUTH EVERY DAY BEFORE BREAKFAST 90 tablet 1   lisinopril (ZESTRIL) 40 MG tablet TAKE 1 TABLET BY MOUTH EVERY DAY 90 tablet 3   meloxicam (MOBIC) 15 MG tablet Take 15 mg by mouth daily as needed.     Multiple Vitamins-Minerals (MULTIVITAMIN WITH MINERALS) tablet Take 1 tablet by mouth every evening.     omeprazole (PRILOSEC) 40 MG capsule TAKE 1 CAPSULE BY MOUTH EVERY DAY 90 capsule 1   No current facility-administered medications for this visit.    Allergies  Allergen Reactions   Bee Pollen Cough   Pollen Extract  Cough   Latex Itching, Rash and Other (See Comments)    Severe itching    Family History  Problem Relation Age of Onset   Mental illness Mother    Hypertension Mother    Schizophrenia Mother    Depression Mother    Heart disease Mother    Diabetes Mother    Cancer Father        LUNG   Hypertension Sister    Cancer Sister        BREAST   Breast cancer Sister    Hypertension Sister    Heart attack Sister    Hypertension Sister    Heart attack Sister    Mental illness Brother    Hypertension Brother    Schizophrenia Brother    Depression Brother    Hypertension Brother    Schizophrenia Brother    Hypertension Brother    Breast cancer Maternal Aunt    Schizophrenia Maternal Uncle    Heart disease Maternal Grandmother    Depression Maternal Grandmother    Schizophrenia Maternal Grandmother    Allergic rhinitis Neg Hx     Angioedema Neg Hx    Asthma Neg Hx    Atopy Neg Hx    Eczema Neg Hx    Immunodeficiency Neg Hx    Urticaria Neg Hx     Social History   Socioeconomic History   Marital status: Married    Spouse name: Not on file   Number of children: Not on file   Years of education: Not on file   Highest education level: Not on file  Occupational History   Not on file  Tobacco Use   Smoking status: Former    Packs/day: 0.25    Years: 6.00    Pack years: 1.50    Types: Cigarettes    Quit date: 1987    Years since quitting: 35.9   Smokeless tobacco: Never   Tobacco comments:    QUIT IN HER EARLY 20'S  Vaping Use   Vaping Use: Never used  Substance and Sexual Activity   Alcohol use: No    Alcohol/week: 0.0 standard drinks   Drug use: No   Sexual activity: Not Currently  Other Topics Concern   Not on file  Social History Narrative   Pt is R handed   Lives in single story home with her husband and father-in-law   Has 4 children   Associates Degree x2   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  HEENT: Denies eye pain, eye redness, ear pain, ringing in the ears, wax buildup, runny nose, nasal congestion, bloody nose, or sore throat. Respiratory: Denies difficulty breathing, shortness of breath, cough or sputum production.   Cardiovascular: Denies chest pain, chest tightness, palpitations or swelling in the hands or feet.  Gastrointestinal: Denies abdominal pain, bloating, constipation, diarrhea or blood in the stool.  GU: Denies urgency, frequency, pain with urination, burning  sensation, blood in urine, odor or discharge. Musculoskeletal: Denies decrease in range of motion, difficulty with gait, muscle pain or joint pain and swelling.  Skin: Pt reports  dry skin, toenail fungus. Denies redness, rashes, lesions or ulcercations.  Neurological: Denies dizziness, difficulty with memory, difficulty with speech or problems with balance and coordination.  Psych: Denies anxiety, depression, SI/HI.  No other specific complaints in a complete review of systems (except as listed in HPI above).     Objective:   Physical Exam   BP (!) 101/56 (BP Location: Right Arm, Patient Position: Sitting, Cuff Size: Large)   Pulse 67   Temp 97.7 F (36.5 C) (Temporal)   Resp 18   Ht 5\' 2"  (1.575 m)   Wt 261 lb (118.4 kg)   LMP 02/02/2017 (Approximate)   SpO2 100%   BMI 47.74 kg/m  Wt Readings from Last 3 Encounters:  11/02/21 261 lb (118.4 kg)  09/22/21 262 lb (118.8 kg)  07/22/21 263 lb 6.4 oz (119.5 kg)    General: Appears her stated age, obese, in NAD. Skin: Warm, dry and intact. Mycotic toenails noted bilaterally. HEENT: Head: normal shape and size; Eyes: sclera white and EOMs intact;  Neck:  Neck supple, trachea midline. No masses, lumps or thyromegaly present.  Cardiovascular: Normal rate and rhythm. S1,S2 noted.  No murmur, rubs or gallops noted. No JVD or BLE edema. No carotid bruits noted. Pulmonary/Chest: Normal effort and positive vesicular breath sounds. No respiratory distress. No wheezes, rales or ronchi noted.  Abdomen: Soft and nontender. Normal bowel sounds. No distention or masses noted. Liver, spleen and kidneys non palpable. Musculoskeletal: Strength 5/5 BUE/BLE. No difficulty with gait.  Neurological: Alert and oriented. Cranial nerves II-XII grossly intact. Coordination normal.  Psychiatric: Mood and affect normal. Behavior is normal. Judgment and thought content normal.     BMET    Component Value Date/Time   NA 140 04/23/2021 1055   K 4.3  04/23/2021 1055   CL 105 04/23/2021 1055   CO2 27 04/23/2021 1055   GLUCOSE 132 (H) 04/23/2021 1055   BUN 20 04/23/2021 1055   CREATININE 1.20 (H) 04/23/2021 1055   CALCIUM 9.3 04/23/2021 1055   GFRNONAA 44 (L) 11/18/2020 0939   GFRNONAA 53 (L) 07/28/2020 0942   GFRAA 61 07/28/2020 0942    Lipid Panel     Component Value Date/Time   CHOL 170 04/23/2021 1055   TRIG 99 04/23/2021 1055   HDL 74 04/23/2021 1055   CHOLHDL 2.3 04/23/2021 1055   VLDL 18 10/17/2019 0630   LDLCALC 78 04/23/2021 1055    CBC    Component Value Date/Time   WBC 8.0 04/23/2021 1055   RBC 4.03 04/23/2021 1055   HGB 10.8 (L) 04/23/2021 1055   HCT 35.5 04/23/2021 1055   PLT 256 04/23/2021 1055   MCV 88.1 04/23/2021 1055   MCH 26.8 (L) 04/23/2021 1055   MCHC 30.4 (L) 04/23/2021 1055   RDW 12.9 04/23/2021 1055   LYMPHSABS 2.6 11/18/2020 0939   MONOABS 0.5 11/18/2020 0939   EOSABS 0.4 11/18/2020 0939   BASOSABS 0.0 11/18/2020 0939    Hgb A1C Lab Results  Component Value Date   HGBA1C 6.3 (H) 04/23/2021           Assessment & Plan:   Preventative Health Maintenance:  Flu shot UTD Tetanus UTD Encouraged her to get her covid booster Pneumovax UTD Prevnar UTD Discussed Shingrix vaccine- advised her if she wants to get this, she should get it done at the pharmacy Pap smear due 2025 Mammogram due 02/2022 Bone density ordered- she will schedule with mammogram 02/2022 Colon screening due per her report, she will call  to schedule this Encouraged her to consume a balanced diet and exercise regimen Advised her to see an eye doctor and dentist annually Will check CBC, CMET, Lipid and A1C  Mycotic Toenails:  Failed OTC treatment CMET today If liver enzymes normal, will give RX for Terbinafine 250 mg daily x 4 weeks, then repeat CMET  RTC in 6 months, follow up chronic conditopms Webb Silversmith, NP This visit occurred during the SARS-CoV-2 public health emergency.  Safety protocols were in  place, including screening questions prior to the visit, additional usage of staff PPE, and extensive cleaning of exam room while observing appropriate contact time as indicated for disinfecting solutions.

## 2021-11-02 NOTE — Assessment & Plan Note (Signed)
Encouraged diet and exercise for weight loss ?

## 2021-11-03 ENCOUNTER — Encounter: Payer: Self-pay | Admitting: Internal Medicine

## 2021-11-03 DIAGNOSIS — N1831 Chronic kidney disease, stage 3a: Secondary | ICD-10-CM

## 2021-11-03 DIAGNOSIS — Z1211 Encounter for screening for malignant neoplasm of colon: Secondary | ICD-10-CM

## 2021-11-03 LAB — HEMOGLOBIN A1C
Hgb A1c MFr Bld: 6.3 % of total Hgb — ABNORMAL HIGH (ref <5.7)
Mean Plasma Glucose: 134 mg/dL
eAG (mmol/L): 7.4 mmol/L

## 2021-11-03 LAB — COMPLETE METABOLIC PANEL WITH GFR
AG Ratio: 1.4 (calc) (ref 1.0–2.5)
ALT: 19 U/L (ref 6–29)
AST: 20 U/L (ref 10–35)
Albumin: 4.1 g/dL (ref 3.6–5.1)
Alkaline phosphatase (APISO): 95 U/L (ref 37–153)
BUN/Creatinine Ratio: 17 (calc) (ref 6–22)
BUN: 25 mg/dL (ref 7–25)
CO2: 29 mmol/L (ref 20–32)
Calcium: 9.2 mg/dL (ref 8.6–10.4)
Chloride: 106 mmol/L (ref 98–110)
Creat: 1.5 mg/dL — ABNORMAL HIGH (ref 0.50–1.05)
Globulin: 3 g/dL (calc) (ref 1.9–3.7)
Glucose, Bld: 103 mg/dL — ABNORMAL HIGH (ref 65–99)
Potassium: 5.1 mmol/L (ref 3.5–5.3)
Sodium: 141 mmol/L (ref 135–146)
Total Bilirubin: 0.3 mg/dL (ref 0.2–1.2)
Total Protein: 7.1 g/dL (ref 6.1–8.1)
eGFR: 40 mL/min/{1.73_m2} — ABNORMAL LOW (ref >=60)

## 2021-11-03 LAB — CBC
HCT: 34.7 % — ABNORMAL LOW (ref 35.0–45.0)
Hemoglobin: 10.9 g/dL — ABNORMAL LOW (ref 11.7–15.5)
MCH: 27.5 pg (ref 27.0–33.0)
MCHC: 31.4 g/dL — ABNORMAL LOW (ref 32.0–36.0)
MCV: 87.6 fL (ref 80.0–100.0)
MPV: 9.7 fL (ref 7.5–12.5)
Platelets: 268 10*3/uL (ref 140–400)
RBC: 3.96 10*6/uL (ref 3.80–5.10)
RDW: 12.7 % (ref 11.0–15.0)
WBC: 7 10*3/uL (ref 3.8–10.8)

## 2021-11-03 LAB — LIPID PANEL
Cholesterol: 153 mg/dL (ref <200)
HDL: 67 mg/dL (ref >=50)
LDL Cholesterol (Calc): 70 mg/dL (calc)
Non-HDL Cholesterol (Calc): 86 mg/dL (calc) (ref <130)
Total CHOL/HDL Ratio: 2.3 (calc) (ref <5.0)
Triglycerides: 78 mg/dL (ref <150)

## 2021-11-03 NOTE — Addendum Note (Signed)
Addended by: Jearld Fenton on: 11/03/2021 08:02 AM   Modules accepted: Level of Service

## 2021-11-04 ENCOUNTER — Other Ambulatory Visit: Payer: Self-pay | Admitting: Internal Medicine

## 2021-11-04 DIAGNOSIS — E782 Mixed hyperlipidemia: Secondary | ICD-10-CM

## 2021-11-05 ENCOUNTER — Encounter: Payer: Self-pay | Admitting: Internal Medicine

## 2021-11-05 ENCOUNTER — Telehealth (HOSPITAL_BASED_OUTPATIENT_CLINIC_OR_DEPARTMENT_OTHER): Payer: Medicare HMO | Admitting: Psychiatry

## 2021-11-05 ENCOUNTER — Encounter (HOSPITAL_COMMUNITY): Payer: Self-pay | Admitting: Psychiatry

## 2021-11-05 ENCOUNTER — Other Ambulatory Visit: Payer: Self-pay

## 2021-11-05 DIAGNOSIS — R69 Illness, unspecified: Secondary | ICD-10-CM | POA: Diagnosis not present

## 2021-11-05 DIAGNOSIS — F200 Paranoid schizophrenia: Secondary | ICD-10-CM | POA: Diagnosis not present

## 2021-11-05 DIAGNOSIS — B351 Tinea unguium: Secondary | ICD-10-CM

## 2021-11-05 MED ORDER — HALOPERIDOL 5 MG PO TABS: 2.5000 mg | ORAL_TABLET | Freq: Every day | ORAL | 2 refills | Status: DC

## 2021-11-05 MED ORDER — TERBINAFINE HCL 250 MG PO TABS: 250.0000 mg | ORAL_TABLET | Freq: Every day | ORAL | 0 refills | Status: DC

## 2021-11-05 NOTE — Telephone Encounter (Signed)
Requested Prescriptions  Pending Prescriptions Disp Refills  . atorvastatin (LIPITOR) 40 MG tablet [Pharmacy Med Name: ATORVASTATIN 40 MG TABLET] 90 tablet 1    Sig: TAKE 1 TABLET BY MOUTH EVERY DAY AT 6PM     There is no refill protocol information for this order

## 2021-11-05 NOTE — Progress Notes (Signed)
Virtual Visit via Telephone Note  I connected with Maria Franklin on 11/05/21 at  2:00 PM EST by telephone and verified that I am speaking with the correct person using two identifiers.  Location: Patient: Home Provider: Home Office   I discussed the limitations, risks, security and privacy concerns of performing an evaluation and management service by telephone and the availability of in person appointments. I also discussed with the patient that there may be a patient responsible charge related to this service. The patient expressed understanding and agreed to proceed.   History of Present Illness: Patient is evaluated by phone session.  She is taking Haldol 2.5 mg and denies any paranoia, hallucination.  She sleeps good.  She did not have to have surgery for the plantar fasciitis as things are much better.  She had a good Thanksgiving as she went out of the town to [place].  She lives with her husband who is supportive.  She denies any crying spells, feeling of hopelessness, suicidal thoughts.  She is trying to lose weight and she had lost 3 pounds since the last visit.  She is not as irritable and angry.  She reported no tremors shakes or any EPS.  Her energy level is good.  She has no plan for Christmas other than she will keep the grandkids when they are out of school.  She is happy about it.  She does drive.  She agreed to continue Haldol 2.5 mg at bedtime.  Psychiatric History: Reviewed. H/O paranoia and delusions. H/O overdose and inpatient at [facility]. H/O overdose and inpatient at [facility]. Readmitted in 4 months due to noncompliant with medication. Last admission at [facility] in Nov 2020. Abilify worked but could not afford. No h/o abuse.    Recent Results (from the past 2160 hour(s))  C-reactive protein     Status: Abnormal   Collection Time: 08/13/21 10:53 AM  Result Value Ref Range   CRP 13.8 (H) <8.0 mg/L  CBC     Status: Abnormal   Collection Time:  11/02/21 10:31 AM  Result Value Ref Range   WBC 7.0 3.8 - 10.8 Thousand/uL   RBC 3.96 3.80 - 5.10 Million/uL   Hemoglobin 10.9 (L) 11.7 - 15.5 g/dL   HCT 34.7 (L) 35.0 - 45.0 %   MCV 87.6 80.0 - 100.0 fL   MCH 27.5 27.0 - 33.0 pg   MCHC 31.4 (L) 32.0 - 36.0 g/dL   RDW 12.7 11.0 - 15.0 %   Platelets 268 140 - 400 Thousand/uL   MPV 9.7 7.5 - 12.5 fL  COMPLETE METABOLIC PANEL WITH GFR     Status: Abnormal   Collection Time: 11/02/21 10:31 AM  Result Value Ref Range   Glucose, Bld 103 (H) 65 - 99 mg/dL    Comment: .            Fasting reference interval . For someone without known diabetes, a glucose value between 100 and 125 mg/dL is consistent with prediabetes and should be confirmed with a follow-up test. .    BUN 25 7 - 25 mg/dL   Creat 1.50 (H) 0.50 - 1.05 mg/dL   eGFR 40 (L) > OR = 60 mL/min/1.59m2    Comment: The eGFR is based on the CKD-EPI 2021 equation. To calculate  the new eGFR from a previous Creatinine or Cystatin C result, go to https://www.kidney.org/professionals/ kdoqi/gfr%5Fcalculator    BUN/Creatinine Ratio 17 6 - 22 (calc)   Sodium 141 135 -  146 mmol/L   Potassium 5.1 3.5 - 5.3 mmol/L   Chloride 106 98 - 110 mmol/L   CO2 29 20 - 32 mmol/L   Calcium 9.2 8.6 - 10.4 mg/dL   Total Protein 7.1 6.1 - 8.1 g/dL   Albumin 4.1 3.6 - 5.1 g/dL   Globulin 3.0 1.9 - 3.7 g/dL (calc)   AG Ratio 1.4 1.0 - 2.5 (calc)   Total Bilirubin 0.3 0.2 - 1.2 mg/dL   Alkaline phosphatase (APISO) 95 37 - 153 U/L   AST 20 10 - 35 U/L   ALT 19 6 - 29 U/L  Lipid panel     Status: None   Collection Time: 11/02/21 10:31 AM  Result Value Ref Range   Cholesterol 153 <200 mg/dL   HDL 67 > OR = 50 mg/dL   Triglycerides 78 <150 mg/dL   LDL Cholesterol (Calc) 70 mg/dL (calc)    Comment: Reference range: <100 . Desirable range <100 mg/dL for primary prevention;   <70 mg/dL for patients with CHD or diabetic patients  with > or = 2 CHD risk factors. Marland Kitchen LDL-C is now calculated using  the Martin-Hopkins  calculation, which is a validated novel method providing  better accuracy than the Friedewald equation in the  estimation of LDL-C.  Cresenciano Genre et al. Annamaria Helling. 9622;297(98): 2061-2068  (http://education.[facility].com/faq/FAQ164)    Total CHOL/HDL Ratio 2.3 <5.0 (calc)   Non-HDL Cholesterol (Calc) 86 <130 mg/dL (calc)    Comment: For patients with diabetes plus 1 major ASCVD risk  factor, treating to a non-HDL-C goal of <100 mg/dL  (LDL-C of <70 mg/dL) is considered a therapeutic  option.   Hemoglobin A1c     Status: Abnormal   Collection Time: 11/02/21 10:31 AM  Result Value Ref Range   Hgb A1c MFr Bld 6.3 (H) <5.7 % of total Hgb    Comment: For someone without known diabetes, a hemoglobin  A1c value between 5.7% and 6.4% is consistent with prediabetes and should be confirmed with a  follow-up test. . For someone with known diabetes, a value <7% indicates that their diabetes is well controlled. A1c targets should be individualized based on duration of diabetes, age, comorbid conditions, and other considerations. . This assay result is consistent with an increased risk of diabetes. . Currently, no consensus exists regarding use of hemoglobin A1c for diagnosis of diabetes for children. .    Mean Plasma Glucose 134 mg/dL   eAG (mmol/L) 7.4 mmol/L     Psychiatric Specialty Exam: Physical Exam  Review of Systems  Weight 261 lb (118.4 kg), last menstrual period 02/02/2017.There is no height or weight on file to calculate BMI.  General Appearance: NA  Eye Contact:  NA  Speech:  Slow  Volume:  Decreased  Mood:  Euthymic  Affect:  NA  Thought Process:  Goal Directed  Orientation:  Full (Time, Place, and Person)  Thought Content:  WDL  Suicidal Thoughts:  No  Homicidal Thoughts:  No  Memory:  Immediate;   Good Recent;   Good Remote;   Good  Judgement:  Intact  Insight:  Present  Psychomotor Activity:  NA  Concentration:  Concentration: Fair  and Attention Span: Fair  Recall:  Good  Fund of Knowledge:  Good  Language:  Good  Akathisia:  No  Handed:  Right  AIMS (if indicated):     Assets:  Communication Skills Desire for Improvement Housing Social Support Transportation  ADL's:  Intact  Cognition:  WNL  Sleep:  good      Assessment and Plan: Schizophrenia chronic paranoid type.  I reviewed blood work results.  Her hemoglobin A1c is 6.3 which is stable from the past.  Patient is taking Haldol 2.5 mg every day and she noticed improvement in her mood, sleep and paranoia.  Discussed medication side effects and benefits.  Recommended to call us back if she is any question or any concern.  Follow-up in 6 months.  Follow Up Instructions:    I discussed the assessment and treatment plan with the patient. The patient was provided an opportunity to ask questions and all were answered. The patient agreed with the plan and demonstrated an understanding of the instructions.   The patient was advised to call back or seek an in-person evaluation if the symptoms worsen or if the condition fails to improve as anticipated.  I provided 19 minutes of non-face-to-face time during this encounter.   Kathlee Nations, MD

## 2021-11-10 ENCOUNTER — Encounter: Payer: Self-pay | Admitting: Internal Medicine

## 2021-11-11 ENCOUNTER — Telehealth: Payer: Self-pay

## 2021-11-12 ENCOUNTER — Other Ambulatory Visit: Payer: Self-pay | Admitting: Internal Medicine

## 2021-11-12 DIAGNOSIS — Z1231 Encounter for screening mammogram for malignant neoplasm of breast: Secondary | ICD-10-CM

## 2021-11-23 ENCOUNTER — Other Ambulatory Visit: Payer: Self-pay | Admitting: Allergy & Immunology

## 2021-11-28 ENCOUNTER — Other Ambulatory Visit: Payer: Self-pay | Admitting: Family Medicine

## 2021-11-28 DIAGNOSIS — I10 Essential (primary) hypertension: Secondary | ICD-10-CM

## 2021-12-06 ENCOUNTER — Other Ambulatory Visit: Payer: Self-pay | Admitting: Family Medicine

## 2021-12-06 DIAGNOSIS — I10 Essential (primary) hypertension: Secondary | ICD-10-CM

## 2021-12-08 ENCOUNTER — Other Ambulatory Visit: Payer: Self-pay

## 2021-12-08 ENCOUNTER — Ambulatory Visit (AMBULATORY_SURGERY_CENTER): Payer: Medicare HMO

## 2021-12-08 VITALS — Ht 62.0 in | Wt 260.0 lb

## 2021-12-08 DIAGNOSIS — Z1211 Encounter for screening for malignant neoplasm of colon: Secondary | ICD-10-CM

## 2021-12-08 MED ORDER — PEG 3350-KCL-NA BICARB-NACL 420 G PO SOLR: 4000.0000 mL | Freq: Once | ORAL | 0 refills | Status: AC

## 2021-12-08 NOTE — Progress Notes (Signed)
   Patient's pre-visit was done today over the phone with the patient   Name,DOB and address verified.   Patient denies any allergies to Eggs and Soy.  Patient denies any problems with anesthesia/sedation. Patient denies taking diet pills or blood thinners.  Denies atrial flutter or atrial fib Denies chronic constipation No home Oxygen.   Packet of Prep instructions mailed to patient including a copy of a consent form-pt is aware.  Patient understands to call us back with any questions or concerns.  Patient is aware of our care-partner policy and Covid-19 safety protocol.  

## 2021-12-11 ENCOUNTER — Encounter: Payer: Self-pay | Admitting: Internal Medicine

## 2021-12-11 ENCOUNTER — Other Ambulatory Visit: Payer: Self-pay | Admitting: Family Medicine

## 2021-12-11 DIAGNOSIS — D631 Anemia in chronic kidney disease: Secondary | ICD-10-CM | POA: Diagnosis not present

## 2021-12-11 DIAGNOSIS — I129 Hypertensive chronic kidney disease with stage 1 through stage 4 chronic kidney disease, or unspecified chronic kidney disease: Secondary | ICD-10-CM | POA: Diagnosis not present

## 2021-12-11 DIAGNOSIS — E785 Hyperlipidemia, unspecified: Secondary | ICD-10-CM | POA: Diagnosis not present

## 2021-12-11 DIAGNOSIS — N2581 Secondary hyperparathyroidism of renal origin: Secondary | ICD-10-CM | POA: Diagnosis not present

## 2021-12-11 DIAGNOSIS — N1832 Chronic kidney disease, stage 3b: Secondary | ICD-10-CM | POA: Diagnosis not present

## 2021-12-11 DIAGNOSIS — I10 Essential (primary) hypertension: Secondary | ICD-10-CM

## 2021-12-14 ENCOUNTER — Other Ambulatory Visit: Payer: Self-pay | Admitting: Nephrology

## 2021-12-14 DIAGNOSIS — N1832 Chronic kidney disease, stage 3b: Secondary | ICD-10-CM

## 2021-12-15 ENCOUNTER — Other Ambulatory Visit: Payer: Self-pay | Admitting: Internal Medicine

## 2021-12-15 ENCOUNTER — Encounter: Payer: Self-pay | Admitting: Internal Medicine

## 2021-12-15 DIAGNOSIS — I10 Essential (primary) hypertension: Secondary | ICD-10-CM

## 2021-12-15 NOTE — Telephone Encounter (Signed)
Requested medication (s) are due for refill today:   Not sure  Requested medication (s) are on the active medication list:   Yes  Future visit scheduled:   No   Last ordered: 07/28/2020 #180, 3 refills by Grier Mitts, MD a yr ago  Returned because not sure who needs to fill this.  It looks like she's under the care of Webb Silversmith from the [facility].     Requested Prescriptions  Pending Prescriptions Disp Refills   hydrALAZINE (APRESOLINE) 25 MG tablet [Pharmacy Med Name: HYDRALAZINE 25 MG TABLET] 180 tablet 3    Sig: TAKE 1/2 TABLET BY MOUTH EVERY MORNING AND AT BEDTIME     There is no refill protocol information for this order

## 2021-12-16 ENCOUNTER — Ambulatory Visit: Payer: Medicare HMO | Admitting: Internal Medicine

## 2021-12-18 ENCOUNTER — Ambulatory Visit: Payer: Self-pay | Admitting: *Deleted

## 2021-12-18 NOTE — Telephone Encounter (Signed)
°  Chief Complaint: lower midline abdominal pain Symptoms: lower pain with walking Frequency: started today, moderate level Pertinent Negatives: Patient denies other symptoms- no fever, diarrhea, vomiting, urinary compaints Disposition: [] ED /[] Urgent Care (no appt availability in office) / [] Appointment(In office/virtual)/ []  [facility]/ [] Home Care/ [x] Refused Recommended Disposition /[] Portal Mobile Bus/ []  Follow-up with PCP Additional Notes: Advised UC per protocol- patient declines at this time- does have appointment next week- will be seen this weekend if gets worse.

## 2021-12-18 NOTE — Telephone Encounter (Signed)
Reason for Disposition  [1] MODERATE pain (e.g., interferes with normal activities) AND [2] pain comes and goes (cramps) AND [3] present > 24 hours  (Exception: pain with Vomiting or Diarrhea - see that Guideline)  Answer Assessment - Initial Assessment Questions 1. LOCATION: "Where does it hurt?"      Midline pelvis pain- lower 2. RADIATION: "Does the pain shoot anywhere else?" (e.g., chest, back)     no 3. ONSET: "When did the pain begin?" (e.g., minutes, hours or days ago)      today 4. SUDDEN: "Gradual or sudden onset?"     sudden 5. PATTERN "Does the pain come and go, or is it constant?"    - If constant: "Is it getting better, staying the same, or worsening?"      (Note: Constant means the pain never goes away completely; most serious pain is constant and it progresses)     - If intermittent: "How long does it last?" "Do you have pain now?"     (Note: Intermittent means the pain goes away completely between bouts)     Comes and goes- only with movement 6. SEVERITY: "How bad is the pain?"  (e.g., Scale 1-10; mild, moderate, or severe)   - MILD (1-3): doesn't interfere with normal activities, abdomen soft and not tender to touch    - MODERATE (4-7): interferes with normal activities or awakens from sleep, abdomen tender to touch    - SEVERE (8-10): excruciating pain, doubled over, unable to do any normal activities      moderate 7. RECURRENT SYMPTOM: "Have you ever had this type of stomach pain before?" If Yes, ask: "When was the last time?" and "What happened that time?"      No- maybe with cycle years ago 8. CAUSE: "What do you think is causing the stomach pain?"     unsure 9. RELIEVING/AGGRAVATING FACTORS: "What makes it better or worse?" (e.g., movement, antacids, bowel movement)     walking 10. OTHER SYMPTOMS: "Do you have any other symptoms?" (e.g., back pain, diarrhea, fever, urination pain, vomiting)       no 11. PREGNANCY: "Is there any chance you are pregnant?" "When was  your last menstrual period?"       *No Answer*  Protocols used: Abdominal Pain - [facility]

## 2021-12-20 ENCOUNTER — Encounter: Payer: Self-pay | Admitting: Certified Registered Nurse Anesthetist

## 2021-12-21 ENCOUNTER — Encounter: Payer: Self-pay | Admitting: Internal Medicine

## 2021-12-21 ENCOUNTER — Ambulatory Visit (AMBULATORY_SURGERY_CENTER): Payer: Medicare HMO | Admitting: Internal Medicine

## 2021-12-21 ENCOUNTER — Other Ambulatory Visit: Payer: Self-pay

## 2021-12-21 VITALS — BP 122/89 | HR 74 | Temp 98.1°F | Resp 12 | Ht 62.0 in | Wt 260.0 lb

## 2021-12-21 DIAGNOSIS — G4733 Obstructive sleep apnea (adult) (pediatric): Secondary | ICD-10-CM | POA: Diagnosis not present

## 2021-12-21 DIAGNOSIS — Z1211 Encounter for screening for malignant neoplasm of colon: Secondary | ICD-10-CM

## 2021-12-21 DIAGNOSIS — D122 Benign neoplasm of ascending colon: Secondary | ICD-10-CM

## 2021-12-21 DIAGNOSIS — D123 Benign neoplasm of transverse colon: Secondary | ICD-10-CM | POA: Diagnosis not present

## 2021-12-21 DIAGNOSIS — I10 Essential (primary) hypertension: Secondary | ICD-10-CM | POA: Diagnosis not present

## 2021-12-21 DIAGNOSIS — K9041 Non-celiac gluten sensitivity: Secondary | ICD-10-CM

## 2021-12-21 MED ORDER — SODIUM CHLORIDE 0.9 % IV SOLN: 500.0000 mL | Freq: Once | INTRAVENOUS | Status: DC

## 2021-12-21 NOTE — Progress Notes (Signed)
GASTROENTEROLOGY PROCEDURE H&P NOTE   Primary Care Physician: Jearld Fenton, NP    Reason for Procedure:   Colon cancer screening  Plan:    Colonoscopy  Patient is appropriate for endoscopic procedure(s) in the ambulatory ([place] [facility]) setting.  The nature of the procedure, as well as the risks, benefits, and alternatives were carefully and thoroughly reviewed with the patient. Ample time for discussion and questions allowed. The patient understood, was satisfied, and agreed to proceed.     HPI: Maria Franklin is a 61 y.o. female who presents for colonoscopy for colon cancer screening. Denies blood in stools, changes in bowel habits, weight loss. Denies fam hx of colon cancer. Last colonoscopy was 10 years ago that was normal.  Past Medical History:  Diagnosis Date   Allergy    Anemia    Angio-edema    Asthma    ALL THE TIME   LAST FLARE UP 12/2018   Chest pain 08/2019   Chicken pox    Chronic kidney disease    Diabetes mellitus without complication (HCC)    GERD (gastroesophageal reflux disease)    Hyperlipidemia    Hypertension    Hyperthyroidism    Recurrent upper respiratory infection (URI)    Schizophrenia (HCC)    Sleep apnea    DX 2-3 YRS AGO.   Sleep apnea    Thyroid disease    Urticaria     Past Surgical History:  Procedure Laterality Date   left adrenal gland removal  07/2019   SINOSCOPY     SINUS ENDO W/FUSION Bilateral 01/05/2019   Procedure: ENDOSCOPIC SINUS SURGERY WITH NAVIGATION;  Surgeon: Jerrell Belfast, MD;  Location: [facility];  Service: ENT;  Laterality: Bilateral;   SINUS IRRIGATION     TONSILLECTOMY     TUBAL LIGATION     TURBINATE REDUCTION N/A 01/05/2019   Procedure: Turbinate Reduction;  Surgeon: Jerrell Belfast, MD;  Location: [facility];  Service: ENT;  Laterality: N/A;    Prior to Admission medications   Medication Sig Start Date End Date Taking? Authorizing Provider  ADVAIR DISKUS 250-50 MCG/ACT AEPB Inhale 1 puff into the lungs in the  morning and at bedtime. 05/05/21  Yes Valentina Shaggy, MD  amLODipine (NORVASC) 10 MG tablet TAKE 1 TABLET BY MOUTH EVERY DAY 03/25/21  Yes Billie Ruddy, MD  aspirin EC 81 MG EC tablet Take 1 tablet (81 mg total) by mouth daily. 09/01/19  Yes Hosie Poisson, MD  atorvastatin (LIPITOR) 40 MG tablet TAKE 1 TABLET BY MOUTH EVERY DAY AT 6PM 11/05/21  Yes Baity, Coralie Keens, NP  b complex vitamins capsule Take 1 capsule by mouth daily.   Yes [provider]  Boswellia-Glucosamine-Vit D (OSTEO BI-FLEX ONE PER DAY PO) Take by mouth.   Yes [provider]  carvedilol (COREG) 12.5 MG tablet TAKE 1 TABLET (12.5 MG TOTAL) BY MOUTH 2 (TWO) TIMES DAILY WITH A MEAL. 09/10/21  Yes Jearld Fenton, NP  cetirizine (ZYRTEC) 10 MG tablet Take 1 tablet (10 mg total) by mouth daily. 05/05/21  Yes Valentina Shaggy, MD  fluticasone [facility]) 50 MCG/ACT nasal spray SPRAY 2 SPRAYS INTO EACH NOSTRIL EVERY DAY 11/23/21  Yes Valentina Shaggy, MD  haloperidol (HALDOL) 5 MG tablet Take 0.5 tablets (2.5 mg total) by mouth at bedtime. Take 1/2 tab at bed time 11/05/21  Yes Arfeen, Arlyce Harman, MD  hydrALAZINE (APRESOLINE) 25 MG tablet TAKE 1/2 TABLET BY MOUTH EVERY MORNING AND AT BEDTIME 12/15/21  Yes Jearld Fenton, NP  levothyroxine (SYNTHROID) 50 MCG tablet TAKE 1 TABLET BY MOUTH EVERY DAY BEFORE BREAKFAST 09/21/21  Yes Billie Ruddy, MD  lisinopril (ZESTRIL) 40 MG tablet TAKE 1 TABLET BY MOUTH EVERY DAY 01/30/21  Yes Billie Ruddy, MD  Multiple Vitamins-Minerals (MULTIVITAMIN WITH MINERALS) tablet Take 1 tablet by mouth every evening.   Yes [provider]  omeprazole (PRILOSEC) 40 MG capsule TAKE 1 CAPSULE BY MOUTH EVERY DAY 09/01/21  Yes Jearld Fenton, NP  albuterol (VENTOLIN HFA) 108 (90 Base) MCG/ACT inhaler INHALE 1 TO 2 PUFFS INTO THE LUNGS EVERY 6 HOURS AS NEEDED WHEEZING/SHORTNESS OF BREATH 05/05/21   Valentina Shaggy, MD  Ferrous Sulfate (IRON) 325 (65 Fe) MG TABS Take 1 tablet  by mouth every morning. 11/05/19   [provider]  meloxicam (MOBIC) 15 MG tablet Take 15 mg by mouth daily as needed. Patient not taking: Reported on 12/08/2021 05/01/21   [provider]  terbinafine (LAMISIL) 250 MG tablet Take 1 tablet (250 mg total) by mouth daily. Patient not taking: Reported on 12/08/2021 11/05/21   Jearld Fenton, NP    Current Outpatient Medications  Medication Sig Dispense Refill   ADVAIR DISKUS 250-50 MCG/ACT AEPB Inhale 1 puff into the lungs in the morning and at bedtime. 60 each 5   amLODipine (NORVASC) 10 MG tablet TAKE 1 TABLET BY MOUTH EVERY DAY 90 tablet 3   aspirin EC 81 MG EC tablet Take 1 tablet (81 mg total) by mouth daily. 30 tablet 0   atorvastatin (LIPITOR) 40 MG tablet TAKE 1 TABLET BY MOUTH EVERY DAY AT 6PM 90 tablet 1   b complex vitamins capsule Take 1 capsule by mouth daily.     Boswellia-Glucosamine-Vit D (OSTEO BI-FLEX ONE PER DAY PO) Take by mouth.     carvedilol (COREG) 12.5 MG tablet TAKE 1 TABLET (12.5 MG TOTAL) BY MOUTH 2 (TWO) TIMES DAILY WITH A MEAL. 180 tablet 1   cetirizine (ZYRTEC) 10 MG tablet Take 1 tablet (10 mg total) by mouth daily. 30 tablet 5   fluticasone (FLONASE) 50 MCG/ACT nasal spray SPRAY 2 SPRAYS INTO EACH NOSTRIL EVERY DAY 16 mL 1   haloperidol (HALDOL) 5 MG tablet Take 0.5 tablets (2.5 mg total) by mouth at bedtime. Take 1/2 tab at bed time 30 tablet 2   hydrALAZINE (APRESOLINE) 25 MG tablet TAKE 1/2 TABLET BY MOUTH EVERY MORNING AND AT BEDTIME 90 tablet 0   levothyroxine (SYNTHROID) 50 MCG tablet TAKE 1 TABLET BY MOUTH EVERY DAY BEFORE BREAKFAST 90 tablet 1   lisinopril (ZESTRIL) 40 MG tablet TAKE 1 TABLET BY MOUTH EVERY DAY 90 tablet 3   Multiple Vitamins-Minerals (MULTIVITAMIN WITH MINERALS) tablet Take 1 tablet by mouth every evening.     omeprazole (PRILOSEC) 40 MG capsule TAKE 1 CAPSULE BY MOUTH EVERY DAY 90 capsule 1   albuterol (VENTOLIN HFA) 108 (90 Base) MCG/ACT inhaler INHALE 1 TO 2 PUFFS INTO  THE LUNGS EVERY 6 HOURS AS NEEDED WHEEZING/SHORTNESS OF BREATH 18 each 1   Ferrous Sulfate (IRON) 325 (65 Fe) MG TABS Take 1 tablet by mouth every morning.     meloxicam (MOBIC) 15 MG tablet Take 15 mg by mouth daily as needed. (Patient not taking: Reported on 12/08/2021)     terbinafine (LAMISIL) 250 MG tablet Take 1 tablet (250 mg total) by mouth daily. (Patient not taking: Reported on 12/08/2021) 30 tablet 0   Current Facility-Administered Medications  Medication Dose Route Frequency  Provider Last Rate Last Admin   0.9 %  sodium chloride infusion  500 mL Intravenous Once Sharyn Creamer, MD        Allergies as of 12/21/2021 - Review Complete 12/21/2021  Allergen Reaction Noted   Bee pollen Cough 03/17/2015   Pollen extract Cough 03/17/2015   Latex Itching, Rash, and Other (See Comments) 01/18/2012    Family History  Problem Relation Age of Onset   Mental illness Mother    Hypertension Mother    Schizophrenia Mother    Depression Mother    Heart disease Mother    Diabetes Mother    Cancer Father        LUNG   Hypertension Sister    Cancer Sister        BREAST   Breast cancer Sister    Hypertension Sister    Heart attack Sister    Hypertension Sister    Heart attack Sister    Mental illness Brother    Hypertension Brother    Schizophrenia Brother    Depression Brother    Hypertension Brother    Schizophrenia Brother    Hypertension Brother    Breast cancer Maternal Aunt    Schizophrenia Maternal Uncle    Heart disease Maternal Grandmother    Depression Maternal Grandmother    Schizophrenia Maternal Grandmother    Allergic rhinitis Neg Hx    Angioedema Neg Hx    Asthma Neg Hx    Atopy Neg Hx    Eczema Neg Hx    Immunodeficiency Neg Hx    Urticaria Neg Hx    Colon cancer Neg Hx    Colon polyps Neg Hx    Stomach cancer Neg Hx    Rectal cancer Neg Hx     Social History   Socioeconomic History   Marital status: Married    Spouse name: Not on file   Number of  children: Not on file   Years of education: Not on file   Highest education level: Not on file  Occupational History   Not on file  Tobacco Use   Smoking status: Former    Packs/day: 0.25    Years: 6.00    Pack years: 1.50    Types: Cigarettes    Quit date: 1987    Years since quitting: 36.0   Smokeless tobacco: Never   Tobacco comments:    QUIT IN HER EARLY 20'S  Vaping Use   Vaping Use: Never used  Substance and Sexual Activity   Alcohol use: No    Alcohol/week: 0.0 standard drinks   Drug use: No   Sexual activity: Not Currently  Other Topics Concern   Not on file  Social History Narrative   Pt is R handed   Lives in single story home with her husband and father-in-law   Has 4 children   Associates Degree x2   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 [place] Determinants of Health   Financial Resource Strain: Not on file  Food Insecurity: Not on file  Transportation Needs: Not on file  Physical Activity: Not on file  Stress: Not on file  Social Connections: Not on file  Intimate Partner Violence: Not on file    Physical Exam: Vital signs in last 24 hours: BP (!) 125/56    Pulse 67    Temp 98.1 F (36.7 C)    Ht 5\' 2"  (1.575 m)    Wt 260  lb (117.9 kg)    LMP 02/02/2017 (Approximate)    SpO2 100%    BMI 47.55 kg/m  GEN: NAD EYE: Sclerae anicteric ENT: MMM CV: Non-tachycardic Pulm: No increased work of breathing GI: Soft, NT/ND NEURO:  Alert & Oriented   [name], MD [facility]  12/21/2021 1:37 PM

## 2021-12-21 NOTE — Progress Notes (Signed)
Report given to PACU, vss 

## 2021-12-21 NOTE — Patient Instructions (Signed)
Discharge instructions given. Handouts on polyps,diverticulosis and hemorrhoids. Resume previous medications. YOU HAD AN ENDOSCOPIC PROCEDURE TODAY AT [facility]:   Refer to the procedure report that was given to you for any specific questions about what was found during the examination.  If the procedure report does not answer your questions, please call your gastroenterologist to clarify.  If you requested that your care partner not be given the details of your procedure findings, then the procedure report has been included in a sealed envelope for you to review at your convenience later.  YOU SHOULD EXPECT: Some feelings of bloating in the abdomen. Passage of more gas than usual.  Walking can help get rid of the air that was put into your GI tract during the procedure and reduce the bloating. If you had a lower endoscopy (such as a colonoscopy or flexible sigmoidoscopy) you may notice spotting of blood in your stool or on the toilet paper. If you underwent a bowel prep for your procedure, you may not have a normal bowel movement for a few days.  Please Note:  You might notice some irritation and congestion in your nose or some drainage.  This is from the oxygen used during your procedure.  There is no need for concern and it should clear up in a day or so.  SYMPTOMS TO REPORT IMMEDIATELY:  Following lower endoscopy (colonoscopy or flexible sigmoidoscopy):  Excessive amounts of blood in the stool  Significant tenderness or worsening of abdominal pains  Swelling of the abdomen that is new, acute  Fever of 100F or higher   For urgent or emergent issues, a gastroenterologist can be reached at any hour by calling [phone]. Do not use MyChart messaging for urgent concerns.    DIET:  We do recommend a small meal at first, but then you may proceed to your regular diet.  Drink plenty of fluids but you should avoid alcoholic beverages for 24 hours.  ACTIVITY:  You should  plan to take it easy for the rest of today and you should NOT DRIVE or use heavy machinery until tomorrow (because of the sedation medicines used during the test).    FOLLOW UP: Our staff will call the number listed on your records 48-72 hours following your procedure to check on you and address any questions or concerns that you may have regarding the information given to you following your procedure. If we do not reach you, we will leave a message.  We will attempt to reach you two times.  During this call, we will ask if you have developed any symptoms of COVID 19. If you develop any symptoms (ie: fever, flu-like symptoms, shortness of breath, cough etc.) before then, please call [phone].  If you test positive for Covid 19 in the 2 weeks post procedure, please call and report this information to [place].    If any biopsies were taken you will be contacted by phone or by letter within the next 1-3 weeks.  Please call us at [phone] if you have not heard about the biopsies in 3 weeks.    SIGNATURES/CONFIDENTIALITY: You and/or your care partner have signed paperwork which will be entered into your electronic medical record.  These signatures attest to the fact that that the information above on your After Visit Summary has been reviewed and is understood.  Full responsibility of the confidentiality of this discharge information lies with you and/or your care-partner.

## 2021-12-21 NOTE — Progress Notes (Signed)
I have reviewed the patient's medical history in detail and updated the computerized patient record.

## 2021-12-21 NOTE — Telephone Encounter (Signed)
She can schedule an appointment here

## 2021-12-21 NOTE — Progress Notes (Signed)
Called to room to assist during endoscopic procedure.  Patient ID and intended procedure confirmed with present staff. Received instructions for my participation in the procedure from the performing physician.  

## 2021-12-21 NOTE — Telephone Encounter (Signed)
Appt scheduled on Thursday, January 19th.

## 2021-12-21 NOTE — Op Note (Signed)
[facility] Patient Name: Maria Franklin Procedure Date: 12/21/2021 1:42 PM MRN: [id number] Endoscopist: Sonny Masters "[name] ,  Age: 61 Referring MD:  Date of Birth: 10/23/1961 Gender: Female Account #: [id number] Procedure:                Colonoscopy Indications:              Screening for colorectal malignant neoplasm Medicines:                Monitored Anesthesia Care Procedure:                Pre-Anesthesia Assessment:                           - Prior to the procedure, a History and Physical                            was performed, and patient medications and                            allergies were reviewed. The patient's tolerance of                            previous anesthesia was also reviewed. The risks                            and benefits of the procedure and the sedation                            options and risks were discussed with the patient.                            All questions were answered, and informed consent                            was obtained. Prior Anticoagulants: The patient has                            taken no previous anticoagulant or antiplatelet                            agents. ASA Grade Assessment: III - A patient with                            severe systemic disease. After reviewing the risks                            and benefits, the patient was deemed in                            satisfactory condition to undergo the procedure.                           After obtaining informed consent, the colonoscope  was passed under direct vision. Throughout the                            procedure, the patient's blood pressure, pulse, and                            oxygen saturations were monitored continuously. The                            CF [id number] #[id number] was introduced through the anus                            and advanced to the the terminal ileum. The                            colonoscopy  was performed without difficulty. The                            patient tolerated the procedure well. The quality                            of the bowel preparation was good. The terminal                            ileum, ileocecal valve, appendiceal orifice, and                            rectum were photographed. Scope In: 1:55:50 PM Scope Out: 2:12:04 PM Scope Withdrawal Time: 0 hours 14 minutes 25 seconds  Total Procedure Duration: 0 hours 16 minutes 14 seconds  Findings:                 The terminal ileum appeared normal.                           Three sessile polyps were found in the transverse                            colon and ascending colon. The polyps were 2 to 5                            mm in size. These polyps were removed with a cold                            snare. Resection and retrieval were complete.                           Multiple diverticula were found in the sigmoid                            colon.                           Non-bleeding internal hemorrhoids were found during  retroflexion. Complications:            No immediate complications. Estimated Blood Loss:     Estimated blood loss was minimal. Impression:               - The examined portion of the ileum was normal.                           - Three 2 to 5 mm polyps in the transverse colon                            and in the ascending colon, removed with a cold                            snare. Resected and retrieved.                           - Diverticulosis in the sigmoid colon.                           - Non-bleeding internal hemorrhoids. Recommendation:           - Discharge patient to home (with escort).                           - Await pathology results.                           - The findings and recommendations were discussed                            with the patient. Sonny Masters "[name],  12/21/2021 2:17:55 PM

## 2021-12-23 ENCOUNTER — Other Ambulatory Visit: Payer: Self-pay | Admitting: Allergy & Immunology

## 2021-12-23 ENCOUNTER — Telehealth: Payer: Self-pay

## 2021-12-23 NOTE — Telephone Encounter (Signed)
°  Follow up Call-  Call back number 12/21/2021  Post procedure Call Back phone  # [phone]  Permission to leave phone message Yes  Some recent data might be hidden     Patient questions:  Do you have a fever, pain , or abdominal swelling? No. Pain Score  0 *  Have you tolerated food without any problems? Yes.    Have you been able to return to your normal activities? Yes.    Do you have any questions about your discharge instructions: Diet   No. Medications  No. Follow up visit  No.  Do you have questions or concerns about your Care? No.  Actions: * If pain score is 4 or above: No action needed, pain <4.

## 2021-12-24 ENCOUNTER — Other Ambulatory Visit: Payer: Self-pay

## 2021-12-24 ENCOUNTER — Encounter: Payer: Self-pay | Admitting: Internal Medicine

## 2021-12-24 ENCOUNTER — Ambulatory Visit (INDEPENDENT_AMBULATORY_CARE_PROVIDER_SITE_OTHER): Payer: Medicare HMO | Admitting: Internal Medicine

## 2021-12-24 ENCOUNTER — Other Ambulatory Visit (HOSPITAL_COMMUNITY)
Admission: RE | Admit: 2021-12-24 | Discharge: 2021-12-24 | Disposition: A | Discharge status: Home / Self Care | Payer: Medicare HMO | Source: Ambulatory Visit | Attending: Internal Medicine | Admitting: Internal Medicine

## 2021-12-24 VITALS — BP 128/64 | HR 84 | Temp 97.7°F | Resp 18 | Ht 62.0 in | Wt 269.4 lb

## 2021-12-24 DIAGNOSIS — N950 Postmenopausal bleeding: Secondary | ICD-10-CM | POA: Diagnosis not present

## 2021-12-24 DIAGNOSIS — Z01419 Encounter for gynecological examination (general) (routine) without abnormal findings: Secondary | ICD-10-CM | POA: Insufficient documentation

## 2021-12-24 DIAGNOSIS — Z5181 Encounter for therapeutic drug level monitoring: Secondary | ICD-10-CM

## 2021-12-24 DIAGNOSIS — B351 Tinea unguium: Secondary | ICD-10-CM | POA: Diagnosis not present

## 2021-12-24 NOTE — Patient Instructions (Signed)
Postmenopausal Bleeding Postmenopausal bleeding is any bleeding that occurs after menopause. Menopause is a time in a woman's life when monthly periods stop. Any type of bleeding after menopause should be checked by your doctor. Treatment will depend on the cause. This kind of bleeding can be caused by: Taking hormones during menopause. Low or high amounts of female hormones in the body. This can cause the lining of the womb (uterus) to become too thin or too thick. Cancer. Growths in the womb that are not cancer. Follow these instructions at home:  Watch for any changes in your symptoms. Let your doctor know about them. Avoid using tampons and douches as told by your doctor. Change your pads regularly. Get regular pelvic exams. This includes Pap tests. Take iron pills as told by your doctor. Take over-the-counter and prescription medicines only as told by your doctor. Keep all follow-up visits. Contact a doctor if: You have new bleeding from the vagina after menopause. You have pain in your belly (abdomen). Get help right away if: You have a fever or chills. You have very bad pain with bleeding. You have clumps of blood (blood clots) coming from your vagina. You have a lot of bleeding, and: You use more than 1 pad an hour. This kind of bleeding has never happened before. You have headaches. You feel dizzy or you feel like you are going to pass out (faint). Summary Any type of bleeding after menopause should be checked by your doctor. Avoid using tampons or douches. Get regular pelvic exams. This includes Pap tests. Contact a doctor if you have new bleeding or pain in your belly. Watch for any changes in your symptoms. Let your doctor know about them. This information is not intended to replace advice given to you by your health care provider. Make sure you discuss any questions you have with your health care provider. Document Revised: 05/08/2020 Document Reviewed:  05/08/2020 Elsevier Patient Education  [employer].

## 2021-12-24 NOTE — Progress Notes (Signed)
Subjective:    Patient ID: Maria Franklin, female    DOB: Sep 28, 1961, 61 y.o.   MRN: [id number]  HPI  Patient presents the clinic today with complaint of postmenopausal vaginal bleeding. She reports this occurred a few weeks ago. She woke up with blood in her panties. She thinks she may have scratching herself after wiping herself the night before. She did have some associated pelvic pain that lasted for about 2 day, but this has resolved. She denies urinary symptoms, vaginal pain, discharge or abnormal vaginal bleeding at this time.  Her last menstrual period was about 3 years ago.  Her last Pap was 07/2019.  She has no family history of ovarian, uterine or cervical cancer.  She also needs her liver function retested. She was on Terbinafine for toenail fungus which she has run out of.  Review of Systems  Past Medical History:  Diagnosis Date   Allergy    Anemia    Angio-edema    Asthma    ALL THE TIME   LAST FLARE UP 12/2018   Chest pain 08/2019   Chicken pox    Chronic kidney disease    Diabetes mellitus without complication (HCC)    GERD (gastroesophageal reflux disease)    Hyperlipidemia    Hypertension    Hyperthyroidism    Recurrent upper respiratory infection (URI)    Schizophrenia (HCC)    Sleep apnea    DX 2-3 YRS AGO.   Sleep apnea    Thyroid disease    Urticaria     Current Outpatient Medications  Medication Sig Dispense Refill   ADVAIR DISKUS 250-50 MCG/ACT AEPB Inhale 1 puff into the lungs in the morning and at bedtime. 60 each 5   albuterol (VENTOLIN HFA) 108 (90 Base) MCG/ACT inhaler INHALE 1 TO 2 PUFFS INTO THE LUNGS EVERY 6 HOURS AS NEEDED WHEEZING/SHORTNESS OF BREATH 18 each 1   amLODipine (NORVASC) 10 MG tablet TAKE 1 TABLET BY MOUTH EVERY DAY 90 tablet 3   aspirin EC 81 MG EC tablet Take 1 tablet (81 mg total) by mouth daily. 30 tablet 0   atorvastatin (LIPITOR) 40 MG tablet TAKE 1 TABLET BY MOUTH EVERY DAY AT 6PM 90 tablet 1   b complex vitamins  capsule Take 1 capsule by mouth daily.     Boswellia-Glucosamine-Vit D (OSTEO BI-FLEX ONE PER DAY PO) Take by mouth.     carvedilol (COREG) 12.5 MG tablet TAKE 1 TABLET (12.5 MG TOTAL) BY MOUTH 2 (TWO) TIMES DAILY WITH A MEAL. 180 tablet 1   cetirizine (ZYRTEC) 10 MG tablet Take 1 tablet (10 mg total) by mouth daily. 30 tablet 5   Ferrous Sulfate (IRON) 325 (65 Fe) MG TABS Take 1 tablet by mouth every morning.     fluticasone (FLONASE) 50 MCG/ACT nasal spray SPRAY 2 SPRAYS INTO EACH NOSTRIL EVERY DAY 16 mL 4   haloperidol (HALDOL) 5 MG tablet Take 0.5 tablets (2.5 mg total) by mouth at bedtime. Take 1/2 tab at bed time 30 tablet 2   hydrALAZINE (APRESOLINE) 25 MG tablet TAKE 1/2 TABLET BY MOUTH EVERY MORNING AND AT BEDTIME 90 tablet 0   levothyroxine (SYNTHROID) 50 MCG tablet TAKE 1 TABLET BY MOUTH EVERY DAY BEFORE BREAKFAST 90 tablet 1   lisinopril (ZESTRIL) 40 MG tablet TAKE 1 TABLET BY MOUTH EVERY DAY 90 tablet 3   meloxicam (MOBIC) 15 MG tablet Take 15 mg by mouth daily as needed. (Patient not taking: Reported on 12/08/2021)  Multiple Vitamins-Minerals (MULTIVITAMIN WITH MINERALS) tablet Take 1 tablet by mouth every evening.     omeprazole (PRILOSEC) 40 MG capsule TAKE 1 CAPSULE BY MOUTH EVERY DAY 90 capsule 1   terbinafine (LAMISIL) 250 MG tablet Take 1 tablet (250 mg total) by mouth daily. (Patient not taking: Reported on 12/08/2021) 30 tablet 0   No current facility-administered medications for this visit.    Allergies  Allergen Reactions   Bee Pollen Cough   Pollen Extract Cough   Latex Itching, Rash and Other (See Comments)    Severe itching    Family History  Problem Relation Age of Onset   Mental illness Mother    Hypertension Mother    Schizophrenia Mother    Depression Mother    Heart disease Mother    Diabetes Mother    Cancer Father        LUNG   Hypertension Sister    Cancer Sister        BREAST   Breast cancer Sister    Hypertension Sister    Heart attack  Sister    Hypertension Sister    Heart attack Sister    Mental illness Brother    Hypertension Brother    Schizophrenia Brother    Depression Brother    Hypertension Brother    Schizophrenia Brother    Hypertension Brother    Breast cancer Maternal Aunt    Schizophrenia Maternal Uncle    Heart disease Maternal Grandmother    Depression Maternal Grandmother    Schizophrenia Maternal Grandmother    Allergic rhinitis Neg Hx    Angioedema Neg Hx    Asthma Neg Hx    Atopy Neg Hx    Eczema Neg Hx    Immunodeficiency Neg Hx    Urticaria Neg Hx    Colon cancer Neg Hx    Colon polyps Neg Hx    Stomach cancer Neg Hx    Rectal cancer Neg Hx     Social History   Socioeconomic History   Marital status: Married    Spouse name: Not on file   Number of children: Not on file   Years of education: Not on file   Highest education level: Not on file  Occupational History   Not on file  Tobacco Use   Smoking status: Former    Packs/day: 0.25    Years: 6.00    Pack years: 1.50    Types: Cigarettes    Quit date: 1987    Years since quitting: 36.0   Smokeless tobacco: Never   Tobacco comments:    QUIT IN HER EARLY 20'S  Vaping Use   Vaping Use: Never used  Substance and Sexual Activity   Alcohol use: No    Alcohol/week: 0.0 standard drinks   Drug use: No   Sexual activity: Not Currently  Other Topics Concern   Not on file  Social History Narrative   Pt is R handed   Lives in single story home with her husband and father-in-law   Has 4 children   Associates Degree x2   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  Respiratory: Denies difficulty breathing, shortness of breath, cough or sputum production.   Cardiovascular: Denies chest pain, chest tightness, palpitations or swelling in the hands or feet.  Gastrointestinal: Denies abdominal pain, bloating, constipation, diarrhea or blood in the stool.  GU: Patient reports postmenopausal vaginal bleeding.  Denies urgency, frequency, pain with urination, burning sensation, blood in urine, odor or discharge. Skin: Pt reports toenail fungus. Denies redness, rashes, lesions or ulcercations.    No other specific complaints in a complete review of systems (except as listed in HPI above).     Objective:   Physical Exam  BP 128/64 (BP Location: Right Arm, Patient Position: Sitting, Cuff Size: Large)    Pulse 84    Temp 97.7 F (36.5 C) (Temporal)    Resp 18    Ht 5\' 2"  (1.575 m)    Wt 269 lb 6.4 oz (122.2 kg)    LMP 02/02/2017 (Approximate)    SpO2 100%    BMI 49.27 kg/m   Wt Readings from Last 3 Encounters:  12/21/21 260 lb (117.9 kg)  12/08/21 260 lb (117.9 kg)  11/02/21 261 lb (118.4 kg)    General: Appears her stated age, obese, in NAD. Skin: Mycotic toenails bilaterally Cardiovascular: Normal rate and rhythm. S1,S2 noted.  No murmur, rubs or gallops noted.  Pulmonary/Chest: Normal effort and positive vesicular breath sounds. No respiratory distress. No wheezes, rales or ronchi noted.  Abdomen: Soft and nontender.  Pelvic: Normal female anatomy. Cervix without mass or lesion. BRB noted at 3 oclock to the right of the cervix at the vaginal wall (could be trauma from the speculum). Musculoskeletal: No difficulty with gait.  Neurological: Alert and oriented.   BMET    Component Value Date/Time   NA 141 11/02/2021 1031   K 5.1 11/02/2021 1031   CL 106 11/02/2021 1031   CO2 29 11/02/2021 1031   GLUCOSE 103 (H) 11/02/2021 1031   BUN 25 11/02/2021 1031   CREATININE 1.50 (H) 11/02/2021 1031   CALCIUM 9.2  11/02/2021 1031   GFRNONAA 44 (L) 11/18/2020 0939   GFRNONAA 53 (L) 07/28/2020 0942   GFRAA 61 07/28/2020 0942    Lipid Panel     Component Value Date/Time   CHOL 153 11/02/2021 1031   TRIG 78 11/02/2021 1031   HDL 67 11/02/2021 1031   CHOLHDL 2.3 11/02/2021 1031   VLDL 18 10/17/2019 0630   LDLCALC 70 11/02/2021 1031    CBC    Component Value Date/Time   WBC 7.0 11/02/2021 1031   RBC 3.96 11/02/2021 1031   HGB 10.9 (L) 11/02/2021 1031   HCT 34.7 (L) 11/02/2021 1031   PLT 268 11/02/2021 1031   MCV 87.6 11/02/2021 1031   MCH 27.5 11/02/2021 1031   MCHC 31.4 (L) 11/02/2021 1031   RDW 12.7 11/02/2021 1031   LYMPHSABS 2.6 11/18/2020 0939   MONOABS 0.5 11/18/2020 0939   EOSABS 0.4 11/18/2020 0939   BASOSABS 0.0 11/18/2020 0939    Hgb A1C Lab Results  Component Value Date   HGBA1C 6.3 (H) 11/02/2021           Assessment & Plan:   Postmenopausal Vaginal Bleeding:  Pap smear today Could consider pelvic/transvaginal ultrasound if reoccurs Seems to have resolved at this time, will monitor for now  Mycotic Toenails, Medication Monitoring:  HFP today If liver enzymes normal, will refill Terbinafine 250 mg daily x 8 weeks  Will follow up after labs with  further recommendation and treatment plan   Webb Silversmith, NP This visit occurred during the SARS-CoV-2 public health emergency.  Safety protocols were in place, including screening questions prior to the visit, additional usage of staff PPE, and extensive cleaning of exam room while observing appropriate contact time as indicated for disinfecting solutions.

## 2021-12-25 ENCOUNTER — Encounter: Payer: Self-pay | Admitting: Internal Medicine

## 2021-12-25 LAB — HEPATIC FUNCTION PANEL
AG Ratio: 1.6 (calc) (ref 1.0–2.5)
ALT: 21 U/L (ref 6–29)
AST: 23 U/L (ref 10–35)
Albumin: 4.2 g/dL (ref 3.6–5.1)
Alkaline phosphatase (APISO): 92 U/L (ref 37–153)
Bilirubin, Direct: 0.1 mg/dL (ref 0.0–0.2)
Globulin: 2.7 g/dL (calc) (ref 1.9–3.7)
Indirect Bilirubin: 0.1 mg/dL (calc) — ABNORMAL LOW (ref 0.2–1.2)
Total Bilirubin: 0.2 mg/dL (ref 0.2–1.2)
Total Protein: 6.9 g/dL (ref 6.1–8.1)

## 2021-12-25 MED ORDER — TERBINAFINE HCL 250 MG PO TABS: 250.0000 mg | ORAL_TABLET | Freq: Every day | ORAL | 0 refills | Status: DC

## 2021-12-25 NOTE — Addendum Note (Signed)
Addended by: Jearld Fenton on: 12/25/2021 11:03 AM   Modules accepted: Orders

## 2021-12-29 LAB — CYTOLOGY - PAP: Diagnosis: NEGATIVE

## 2021-12-30 ENCOUNTER — Encounter: Payer: Self-pay | Admitting: Internal Medicine

## 2021-12-31 ENCOUNTER — Encounter: Payer: Self-pay | Admitting: Allergy & Immunology

## 2022-01-01 ENCOUNTER — Ambulatory Visit
Admission: RE | Admit: 2022-01-01 | Discharge: 2022-01-01 | Disposition: A | Discharge status: Home / Self Care | Payer: Medicare HMO | Source: Ambulatory Visit | Attending: Nephrology | Admitting: Nephrology

## 2022-01-01 DIAGNOSIS — K760 Fatty (change of) liver, not elsewhere classified: Secondary | ICD-10-CM | POA: Diagnosis not present

## 2022-01-01 DIAGNOSIS — N1832 Chronic kidney disease, stage 3b: Secondary | ICD-10-CM | POA: Diagnosis not present

## 2022-01-06 ENCOUNTER — Telehealth: Payer: Self-pay

## 2022-01-06 ENCOUNTER — Encounter: Payer: Self-pay | Admitting: Internal Medicine

## 2022-01-06 NOTE — Telephone Encounter (Signed)
Left message for patient to please call back, trying to get more information on her Mychart message

## 2022-01-06 NOTE — Telephone Encounter (Signed)
Called patient back and let her know Dr. Lorenso Courier said the original path report was a mistake

## 2022-01-12 ENCOUNTER — Other Ambulatory Visit: Payer: Self-pay | Admitting: Internal Medicine

## 2022-01-12 DIAGNOSIS — Z780 Asymptomatic menopausal state: Secondary | ICD-10-CM

## 2022-01-14 ENCOUNTER — Encounter: Payer: Self-pay | Admitting: Internal Medicine

## 2022-01-14 DIAGNOSIS — D259 Leiomyoma of uterus, unspecified: Secondary | ICD-10-CM

## 2022-01-18 ENCOUNTER — Other Ambulatory Visit: Payer: Self-pay | Admitting: Internal Medicine

## 2022-01-18 DIAGNOSIS — D259 Leiomyoma of uterus, unspecified: Secondary | ICD-10-CM

## 2022-01-20 ENCOUNTER — Other Ambulatory Visit: Payer: Self-pay

## 2022-01-20 ENCOUNTER — Ambulatory Visit
Admission: RE | Admit: 2022-01-20 | Discharge: 2022-01-20 | Disposition: A | Discharge status: Home / Self Care | Payer: Medicare HMO | Source: Ambulatory Visit | Attending: Internal Medicine | Admitting: Internal Medicine

## 2022-01-20 DIAGNOSIS — D259 Leiomyoma of uterus, unspecified: Secondary | ICD-10-CM | POA: Diagnosis not present

## 2022-01-22 ENCOUNTER — Ambulatory Visit: Payer: Medicare HMO

## 2022-01-28 ENCOUNTER — Ambulatory Visit (INDEPENDENT_AMBULATORY_CARE_PROVIDER_SITE_OTHER): Payer: Medicare HMO | Admitting: Allergy & Immunology

## 2022-01-28 ENCOUNTER — Encounter: Payer: Self-pay | Admitting: Allergy & Immunology

## 2022-01-28 ENCOUNTER — Other Ambulatory Visit: Payer: Self-pay

## 2022-01-28 ENCOUNTER — Other Ambulatory Visit: Payer: Self-pay | Admitting: Allergy & Immunology

## 2022-01-28 VITALS — BP 110/80 | HR 92 | Temp 98.0°F | Resp 20 | Ht 63.0 in | Wt 261.0 lb

## 2022-01-28 DIAGNOSIS — J3089 Other allergic rhinitis: Secondary | ICD-10-CM | POA: Diagnosis not present

## 2022-01-28 DIAGNOSIS — J302 Other seasonal allergic rhinitis: Secondary | ICD-10-CM

## 2022-01-28 DIAGNOSIS — J4541 Moderate persistent asthma with (acute) exacerbation: Secondary | ICD-10-CM

## 2022-01-28 DIAGNOSIS — J339 Nasal polyp, unspecified: Secondary | ICD-10-CM

## 2022-01-28 MED ORDER — FLUTICASONE PROPIONATE 50 MCG/ACT NA SUSP: NASAL | 5 refills | Status: DC

## 2022-01-28 MED ORDER — PREDNISONE 10 MG PO TABS: ORAL_TABLET | ORAL | 0 refills | Status: DC

## 2022-01-28 MED ORDER — CETIRIZINE HCL 10 MG PO TABS: 10.0000 mg | ORAL_TABLET | Freq: Every day | ORAL | 5 refills | Status: AC

## 2022-01-28 MED ORDER — ADVAIR DISKUS 250-50 MCG/ACT IN AEPB: 1.0000 | INHALATION_SPRAY | Freq: Two times a day (BID) | RESPIRATORY_TRACT | 5 refills | Status: DC

## 2022-01-28 MED ORDER — ALBUTEROL SULFATE (2.5 MG/3ML) 0.083% IN NEBU: 2.5000 mg | INHALATION_SOLUTION | RESPIRATORY_TRACT | 1 refills | Status: AC | PRN

## 2022-01-28 MED ORDER — ALBUTEROL SULFATE HFA 108 (90 BASE) MCG/ACT IN AERS: INHALATION_SPRAY | RESPIRATORY_TRACT | 1 refills | Status: DC

## 2022-01-28 NOTE — Progress Notes (Signed)
FOLLOW UP  Date of Service/Encounter:  01/28/22   Assessment:   Seasonal and perennial allergic rhinitis (grasses, weeds, trees, indoor and outdoor molds, dust mite, roach)   Moderate persistent asthma with acute exacerbation secondary to influenza   Adverse reaction to Dupixent - unclear causation   Nasal polyposis - s/p three sinus surgeries (1990s, 2013, 2020)   Adverse food reactions (nuts, mushrooms) - with negative testing the past   Pruritus - controlled with emollients alone   Difficulty affording medications - improved with a medication grant  Fully vaccinated to COVID-19  Plan/Recommendations:   1. Seasonal and perennial allergic rhinitis - Continue with the Flonase two sprays per nostril daily.  - Continue with Claritin one tablet daily.  2. Moderate persistent asthma - not well controlled - Lung testing looked terrible today but this was likely reflective of your recent flu infection.  - We are going to send in some prednisone for you today (complete the entire course of this). - We are not going to make any changes today.  - We sent in refills for your medications today.  - Daily controller medication(s): Advair 250/50 one puff once daily - Prior to physical activity: albuterol 2 puffs 10-15 minutes before physical activity. - Rescue medications: albuterol 4 puffs every 4-6 hours as needed or albuterol nebulizer one vial every 4-6 hours as needed - Asthma control goals:  * Full participation in all desired activities (may need albuterol before activity) * Albuterol use two time or less a week on average (not counting use with activity) * Cough interfering with sleep two time or less a month * Oral steroids no more than once a year * No hospitalizations  3. Nasal polyposis - Continue with your current management.  - Continue to follow with ENT.   4. Return in about 6 months (around 07/28/2022).   Subjective:   Maria Franklin is a 61 y.o. female  presenting today for follow up of  Chief Complaint  Patient presents with   Asthma    Coughing for 2-3 weeks, just got over the flu 2-3 weeks, lungs haven't completely recovered.    Allergic Rhinitis     Allergies doing okay    Maria Franklin has a history of the following: Patient Active Problem List   Diagnosis Date Noted   Morbid obesity ([place]) 07/24/2021   Paranoid schizophrenia ([place]) 10/16/2019   CKD (chronic kidney disease), stage III ([place]) 08/29/2019   Moderate persistent asthma 07/06/2019   GERD (gastroesophageal reflux disease) 10/28/2015   Type 2 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 disease, without long-term current use of insulin ([place]) 04/19/2014   Hypertension 01/10/2013   Hyperlipidemia 01/10/2013   Hypothyroidism 01/10/2013   Asthma 03/13/2009    History obtained from: chart review and patient.  Maria Franklin is a 61 y.o. female presenting for a follow up visit.  She was last seen in May 2022.  At that time, we continue with Flonase 2 sprays per nostril daily as well as Claritin.  For her asthma, we continued with Advair 250/50 mcg 1 puff twice daily as well as albuterol as needed.  She was on Dupixent in the past for her nasal polyposis, but she stopped it.  Since last visit, she mostly did well. But she got the flu two weeks ago and her breathing has been a problem since that time. She caught it at church and spread it to many other family members. She did not get Tamiflu. She felt  pretty bad despite being vaccinated against the flu.  Asthma/Respiratory Symptom History: She remains on the Advair one puff twice daily. It is unclear where she is getting her refills. She actually stopped taking it because it cost too much. She now has a grant that has made it more affordable for her. Now she is getting it for free.   She has a nebulizer machine at home and needs more refills for this. She is still not back to her baseline. She did not get prednisone from anywhere at all.  This grant program pays the pharmacy directly.   Allergic Rhinitis Symptom History: Nasal symptoms have been under good control. She is using her Flonase once daily.  She has really done well aside from when the flu hit her. She has not seen ENT in a while for her nasal polyps.   She had an elevated CRP and saw Rheumatology. Everything was deemed benign. Colonoscopy and vaccinations are all up to date. Diabetes is somewhat controlled with a recent hemoglobin A1c of 6.3%. She does need the Shingrix. COVID vaccines are all up to date. She has not contracted this yet to her knowledge. She remains on Haldol which she takes at night for treatment of her paranoid schizophrenia.   She is going to [place] in two weeks to visit her brother. She is going to be there for 3-4 days.   Otherwise, there have been no changes to her past medical history, surgical history, family history, or social history.    Review of Systems  Constitutional: Negative.  Negative for chills, malaise/fatigue and weight loss.  HENT: Negative.  Negative for congestion, ear discharge, ear pain and sinus pain.   Eyes:  Negative for pain, discharge and redness.  Respiratory:  Positive for cough and shortness of breath. Negative for sputum production and wheezing.   Cardiovascular: Negative.  Negative for chest pain and palpitations.  Gastrointestinal:  Negative for abdominal pain, constipation, diarrhea, heartburn, nausea and vomiting.  Skin: Negative.  Negative for itching and rash.  Neurological:  Negative for dizziness and headaches.  Endo/Heme/Allergies:  Negative for environmental allergies. Does not bruise/bleed easily.      Objective:   Blood pressure 110/80, pulse 92, temperature 98 F (36.7 C), resp. rate 20, height 5\' 3"  (1.6 m), weight 261 lb (118.4 kg), last menstrual period 02/02/2017, SpO2 99 %. Body mass index is 46.23 kg/m.    Physical Exam Vitals reviewed.  Constitutional:      Appearance: She is  well-developed.     Comments: Delightful. Interactive.   HENT:     Head: Normocephalic and atraumatic.     Right Ear: Tympanic membrane, ear canal and external ear normal.     Left Ear: Tympanic membrane, ear canal and external ear normal.     Nose: No nasal deformity, septal deviation, mucosal edema or rhinorrhea.     Right Turbinates: Enlarged, swollen and pale.     Left Turbinates: Enlarged, swollen and pale.     Right Sinus: No maxillary sinus tenderness or frontal sinus tenderness.     Left Sinus: No maxillary sinus tenderness or frontal sinus tenderness.     Comments: Turbinates actually do not look enlarged today compared to previous exams. Polypoid tissue present bilaterally.      Mouth/Throat:     Mouth: Mucous membranes are not pale and not dry.     Pharynx: Uvula midline.  Eyes:     General:        Right eye: No  discharge.        Left eye: No discharge.     Conjunctiva/sclera: Conjunctivae normal.     Right eye: Right conjunctiva is not injected. No chemosis.    Left eye: Left conjunctiva is not injected. No chemosis.    Pupils: Pupils are equal, round, and reactive to light.  Cardiovascular:     Rate and Rhythm: Normal rate and regular rhythm.     Heart sounds: Normal heart sounds.  Pulmonary:     Effort: Pulmonary effort is normal. No tachypnea, accessory muscle usage or respiratory distress.     Breath sounds: Normal breath sounds. No wheezing, rhonchi or rales.     Comments: Decreased air movement at the bases. Some wheezing noted in the upper fields. Chest:     Chest wall: No tenderness.  Lymphadenopathy:     Cervical: No cervical adenopathy.  Skin:    General: Skin is warm.     Capillary Refill: Capillary refill takes less than 2 seconds.     Coloration: Skin is not pale.     Findings: No abrasion, erythema, petechiae or rash. Rash is not papular, urticarial or vesicular.     Comments: No eczematous or urticarial lesions noted.  Neurological:     Mental  Status: She is alert.  Psychiatric:        Behavior: Behavior is cooperative.     Diagnostic studies:    Spirometry: results abnormal (FEV1: 0.95L, FVC: 1.67L, FEV1/FVC: 57%).    Spirometry consistent with mixed obstructive and restrictive disease.   Allergy Studies: none       Salvatore Marvel, MD  [facility]

## 2022-01-28 NOTE — Addendum Note (Signed)
Addended by: Jacqualin Combes on: 01/28/2022 01:56 PM   Modules accepted: Orders

## 2022-01-28 NOTE — Patient Instructions (Addendum)
1. Seasonal and perennial allergic rhinitis - Continue with the Flonase two sprays per nostril daily.  - Continue with Claritin one tablet daily.  2. Moderate persistent asthma - not well controlled - Lung testing looked terrible today but this was likely reflective of your recent flu infection.  - We are going to send in some prednisone for you today (complete the entire course of this). - We are not going to make any changes today.  - We sent in refills for your medications today.  - Daily controller medication(s): Advair 250/50 one puff once daily - Prior to physical activity: albuterol 2 puffs 10-15 minutes before physical activity. - Rescue medications: albuterol 4 puffs every 4-6 hours as needed or albuterol nebulizer one vial every 4-6 hours as needed - Asthma control goals:  * Full participation in all desired activities (may need albuterol before activity) * Albuterol use two time or less a week on average (not counting use with activity) * Cough interfering with sleep two time or less a month * Oral steroids no more than once a year * No hospitalizations  3. Nasal polyposis - Continue with your current management.  - Continue to follow with ENT.   4. Return in about 6 months (around 07/28/2022).    Please inform us of any Emergency Department visits, hospitalizations, or changes in symptoms. Call us before going to the ED for breathing or allergy symptoms since we might be able to fit you in for a sick visit. Feel free to contact us anytime with any questions, problems, or concerns.  It was a pleasure to see you again today!  Websites that have reliable patient information: 1. American Academy of Asthma, Allergy, and Immunology: www.aaaai.org 2. Food Allergy Research and Education (FARE): foodallergy.org 3. Mothers of Asthmatics: http://www.asthmacommunitynetwork.org 4. American College of Allergy, Asthma, and Immunology: www.acaai.org   COVID-19 Vaccine Information can  be found at: ShippingScam.co.uk For questions related to vaccine distribution or appointments, please email [email or web address] or call [phone].   We realize that you might be concerned about having an allergic reaction to the COVID19 vaccines. To help with that concern, WE ARE OFFERING THE COVID19 VACCINES IN OUR OFFICE! Ask the front desk for dates!     Like [place] on [employer] and Instagram for our latest updates!      A healthy democracy works best when [employer] participate! Make sure you are registered to vote! If you have moved or changed any of your contact information, you will need to get this updated before voting!  In some cases, you MAY be able to register to vote online: [email or web address]

## 2022-02-02 ENCOUNTER — Other Ambulatory Visit: Payer: Self-pay | Admitting: Family Medicine

## 2022-02-02 ENCOUNTER — Ambulatory Visit
Admission: RE | Admit: 2022-02-02 | Discharge: 2022-02-02 | Disposition: A | Discharge status: Home / Self Care | Payer: Medicare HMO | Source: Ambulatory Visit | Attending: Internal Medicine | Admitting: Internal Medicine

## 2022-02-02 ENCOUNTER — Other Ambulatory Visit: Payer: Self-pay

## 2022-02-02 DIAGNOSIS — I10 Essential (primary) hypertension: Secondary | ICD-10-CM

## 2022-02-02 DIAGNOSIS — Z1231 Encounter for screening mammogram for malignant neoplasm of breast: Secondary | ICD-10-CM | POA: Diagnosis not present

## 2022-02-16 ENCOUNTER — Other Ambulatory Visit: Payer: Self-pay | Admitting: Internal Medicine

## 2022-02-16 NOTE — Telephone Encounter (Signed)
Requested medication (s) are due for refill today: yes ? ?Requested medication (s) are on the active medication list: yes ? ?Last refill:  09/01/21 #90/1 ? ?Future visit scheduled: yes ? ?Notes to clinic:  Unable to refill per protocol, medication not assigned to the refill protocol. ? ? ?  ?Requested Prescriptions  ?Pending Prescriptions Disp Refills  ? omeprazole (PRILOSEC) 40 MG capsule [Pharmacy Med Name: OMEPRAZOLE DR 40 MG CAPSULE] 90 capsule 1  ?  Sig: TAKE 1 CAPSULE BY MOUTH EVERY DAY  ?  ? There is no refill protocol information for this order  ?  ? ?

## 2022-02-17 ENCOUNTER — Encounter: Payer: Self-pay | Admitting: Internal Medicine

## 2022-03-07 ENCOUNTER — Other Ambulatory Visit: Payer: Self-pay | Admitting: Internal Medicine

## 2022-03-09 NOTE — Telephone Encounter (Signed)
Requested medication (s) are due for refill today:   Yes ? ?Requested medication (s) are on the active medication list:   Yes ? ?Future visit scheduled:   Yes with Rollene Fare at [facility]  (Also looks like pt goes to Dr. Grier Mitts) ? ? ?Last ordered: 09/10/2021 #180, 1 refill ? ?Returned because no protocol assigned to this medication and clarification of provider  ? ?Requested Prescriptions  ?Pending Prescriptions Disp Refills  ? carvedilol (COREG) 12.5 MG tablet [Pharmacy Med Name: CARVEDILOL 12.5 MG TABLET] 180 tablet 1  ?  Sig: TAKE 1 TABLET (12.5 MG TOTAL) BY MOUTH 2 (TWO) TIMES DAILY WITH A MEAL.  ?  ? There is no refill protocol information for this order  ?  ? ?

## 2022-03-13 ENCOUNTER — Other Ambulatory Visit: Payer: Self-pay | Admitting: Internal Medicine

## 2022-03-13 DIAGNOSIS — I10 Essential (primary) hypertension: Secondary | ICD-10-CM

## 2022-03-15 NOTE — Telephone Encounter (Signed)
Requested Prescriptions  ?Pending Prescriptions Disp Refills  ?? hydrALAZINE (APRESOLINE) 25 MG tablet [Pharmacy Med Name: HYDRALAZINE 25 MG TABLET] 90 tablet 0  ?  Sig: TAKE 1/2 TABLET BY MOUTH EVERY MORNING AND AT BEDTIME  ?  ? Cardiovascular:  Vasodilators Failed - 03/15/2022 12:42 PM  ?  ?  Failed - HCT in normal range and within 360 days  ?  HCT  ?Date Value Ref Range Status  ?11/02/2021 34.7 (L) 35.0 - 45.0 % Final  ?   ?  ?  Failed - HGB in normal range and within 360 days  ?  Hemoglobin  ?Date Value Ref Range Status  ?11/02/2021 10.9 (L) 11.7 - 15.5 g/dL Final  ?   ?  ?  Passed - RBC in normal range and within 360 days  ?  RBC  ?Date Value Ref Range Status  ?11/02/2021 3.96 3.80 - 5.10 Million/uL Final  ?   ?  ?  Passed - WBC in normal range and within 360 days  ?  WBC  ?Date Value Ref Range Status  ?11/02/2021 7.0 3.8 - 10.8 Thousand/uL Final  ?   ?  ?  Passed - PLT in normal range and within 360 days  ?  Platelets  ?Date Value Ref Range Status  ?11/02/2021 268 140 - 400 Thousand/uL Final  ?   ?  ?  Passed - ANA Screen, Ifa, Serum in normal range and within 360 days  ?  Anti Nuclear Antibody (ANA)  ?Date Value Ref Range Status  ?07/22/2021 NEGATIVE NEGATIVE Final  ?  Comment:  ?  ANA IFA is a first line screen for detecting the ?presence of up to approximately 150 autoantibodies in ?various autoimmune diseases. A negative ANA IFA result ?suggests an ANA-associated autoimmune disease is not ?present at this time, but is not definitive. If there ?is high clinical suspicion for Sjogren's syndrome, ?testing for anti-SS-A/Ro antibody should be considered. ?Anti-Jo-1 antibody should be considered for clinically ?suspected inflammatory myopathies. ?. ?AC-0: Negative ?Marland Kitchen ?International Consensus on ANA Patterns ?(https://www.hernandez-brewer.com/) ?Marland Kitchen ?For additional information, please refer to ?http://education.[facility].com/faq/FAQ177 ?(This link is being provided for informational/ ?educational  purposes only.) ?. ?  ?   ?  ?  Passed - Last BP in normal range  ?  BP Readings from Last 1 Encounters:  ?01/28/22 110/80  ?   ?  ?  Passed - Valid encounter within last 12 months  ?  Recent Outpatient Visits   ?      ? 2 months ago Postmenopausal vaginal bleeding  ? [facility] [place], Coralie Keens, NP  ? 4 months ago Encounter for general adult medical examination with abnormal findings  ? [facility] [place], [place] W, NP  ? 7 months ago Excessive daytime sleepiness  ? [facility] [place], [place] W, NP  ? 10 months ago Type 2 diabetes mellitus without complication, without long-term current use of insulin ([place])  ? [facility] [place], Coralie Keens, NP  ?  ?  ?Future Appointments   ?        ? In 1 month Baity, Coralie Keens, NP [facility], [place]  ? In 6 months Valentina Shaggy, MD [facility]  ?  ? ?  ?  ?  ? ?

## 2022-03-24 ENCOUNTER — Telehealth: Payer: Self-pay

## 2022-03-24 NOTE — Telephone Encounter (Signed)
Left message for patient to call back and schedule the Medicare Annual Wellness Visit (AWV) virtually, telephone or face to face. ?  ?Maria Franklin, [name] ?[phone]  ?

## 2022-03-27 ENCOUNTER — Other Ambulatory Visit: Payer: Self-pay | Admitting: Family Medicine

## 2022-03-27 DIAGNOSIS — E039 Hypothyroidism, unspecified: Secondary | ICD-10-CM

## 2022-04-04 ENCOUNTER — Other Ambulatory Visit: Payer: Self-pay | Admitting: Family Medicine

## 2022-04-04 DIAGNOSIS — I10 Essential (primary) hypertension: Secondary | ICD-10-CM

## 2022-04-05 NOTE — Telephone Encounter (Signed)
From OV encounter May 2022: Patient presents the clinic today to establish care.  She is transferring care from Grier Mitts, MD. ?

## 2022-04-06 ENCOUNTER — Ambulatory Visit (INDEPENDENT_AMBULATORY_CARE_PROVIDER_SITE_OTHER): Payer: Medicare HMO

## 2022-04-06 DIAGNOSIS — Z0000 Encounter for general adult medical examination without abnormal findings: Secondary | ICD-10-CM

## 2022-04-06 NOTE — Patient Instructions (Signed)

## 2022-04-06 NOTE — Progress Notes (Signed)
? ? ? ?.I connected with Maria Franklin by telephone and verified that I am speaking with the correct person using two identifiers. ?Location patient: home ?Location provider: work ?Persons participating in the virtual visit:  Maria Franklin, Donnie Mesa, CNA, CMA ?  ?I discussed the limitations, risks, security and privacy concerns of performing an evaluation and management service by telephone and the availability of in person appointments. I also discussed with the patient that there may be a patient responsible charge related to this service. The patient expressed understanding and verbally consented to this telephonic visit.  ?  ? ?Subjective: ?  ? Maria Franklin is a 61 y.o. female who presents for Medicare Annual (Subsequent) preventive examination. ? ?Review of Systems    ?Per HPI unless specifically indicated below  ?  ? ?   ?Objective:  ?  ?There were no vitals filed for this visit. ?There is no height or weight on file to calculate BMI. ? ? ?  11/18/2020  ?  7:02 AM 07/28/2020  ?  9:52 AM 10/16/2019  ?  3:00 PM 10/16/2019  ?  1:59 PM 08/30/2019  ?  8:26 AM 08/21/2019  ?  9:58 AM 03/29/2019  ?  6:46 PM  ?Advanced Directives  ?Does Patient Have a Medical Advance Directive? No No   No No No  ?Would patient like information on creating a medical advance directive?  No - Patient declined   No - Patient declined No - Patient declined No - Patient declined  ?  ? Information is confidential and restricted. Go to Review Flowsheets to unlock data.  ? ? ?Current Medications (verified) ?Outpatient Encounter Medications as of 04/06/2022  ?Medication Sig  ? ADVAIR DISKUS 250-50 MCG/ACT AEPB Inhale 1 puff into the lungs in the morning and at bedtime.  ? albuterol (PROVENTIL) (2.5 MG/3ML) 0.083% nebulizer solution Take 3 mLs (2.5 mg total) by nebulization every 4 (four) hours as needed for wheezing or shortness of breath.  ? albuterol (VENTOLIN HFA) 108 (90 Base) MCG/ACT inhaler INHALE 1 TO 2 PUFFS INTO THE LUNGS EVERY 6  HOURS AS NEEDED WHEEZING/SHORTNESS OF BREATH  ? amLODipine (NORVASC) 10 MG tablet TAKE 1 TABLET BY MOUTH EVERY DAY  ? aspirin EC 81 MG EC tablet Take 1 tablet (81 mg total) by mouth daily.  ? atorvastatin (LIPITOR) 40 MG tablet TAKE 1 TABLET BY MOUTH EVERY DAY AT 6PM  ? b complex vitamins capsule Take 1 capsule by mouth daily.  ? [place]-Glucosamine-Vit D (OSTEO BI-FLEX ONE PER DAY PO) Take by mouth.  ? carvedilol (COREG) 12.5 MG tablet TAKE 1 TABLET (12.5 MG TOTAL) BY MOUTH 2 (TWO) TIMES DAILY WITH A MEAL.  ? cetirizine (ZYRTEC) 10 MG tablet Take 1 tablet (10 mg total) by mouth daily.  ? Ferrous Sulfate (IRON) 325 (65 Fe) MG TABS Take 1 tablet by mouth every morning.  ? fluticasone (FLONASE) 50 MCG/ACT nasal spray SPRAY 2 SPRAYS INTO EACH NOSTRIL EVERY DAY  ? haloperidol (HALDOL) 5 MG tablet Take 0.5 tablets (2.5 mg total) by mouth at bedtime. Take 1/2 tab at bed time  ? hydrALAZINE (APRESOLINE) 25 MG tablet TAKE 1/2 TABLET BY MOUTH EVERY MORNING AND AT BEDTIME  ? levothyroxine (SYNTHROID) 50 MCG tablet TAKE 1 TABLET BY MOUTH EVERY DAY BEFORE BREAKFAST  ? lisinopril (ZESTRIL) 40 MG tablet TAKE 1 TABLET BY MOUTH EVERYDAY  ? Multiple Vitamins-Minerals (MULTIVITAMIN WITH MINERALS) tablet Take 1 tablet by mouth every evening.  ? omeprazole (PRILOSEC) 40 MG capsule TAKE  1 CAPSULE BY MOUTH EVERY DAY  ? terbinafine (LAMISIL) 250 MG tablet Take 1 tablet (250 mg total) by mouth daily. (Patient not taking: Reported on 04/06/2022)  ? [DISCONTINUED] predniSONE (DELTASONE) 10 MG tablet Take 3 tabs ('30mg'$ ) twice daily for 3 days, then 2 tabs ('20mg'$ ) twice daily for 3 days, then 1 tab ('10mg'$ ) twice daily for 3 days, then STOP. (Patient not taking: Reported on 04/06/2022)  ? ?No facility-administered encounter medications on file as of 04/06/2022.  ? ? ?Allergies (verified) ?Bee pollen, Pollen extract, and Latex  ? ?History: ?Past Medical History:  ?Diagnosis Date  ? Allergy   ? Anemia   ? Angio-edema   ? Asthma   ? ALL THE TIME   LAST  FLARE UP 12/2018  ? Chest pain 08/2019  ? Chicken pox   ? Chronic kidney disease   ? Diabetes mellitus without complication ([place])   ? GERD (gastroesophageal reflux disease)   ? Hyperlipidemia   ? Hypertension   ? Hyperthyroidism   ? Recurrent upper respiratory infection (URI)   ? Schizophrenia ([place])   ? Sleep apnea   ? DX 2-3 YRS AGO.  ? Sleep apnea   ? Thyroid disease   ? Urticaria   ? ?Past Surgical History:  ?Procedure Laterality Date  ? left adrenal gland removal  07/2019  ? SINOSCOPY    ? SINUS ENDO W/FUSION Bilateral 01/05/2019  ? Procedure: ENDOSCOPIC SINUS SURGERY WITH NAVIGATION;  Surgeon: Jerrell Belfast, MD;  Location: [place];  Service: ENT;  Laterality: Bilateral;  ? SINUS IRRIGATION    ? TONSILLECTOMY    ? TUBAL LIGATION    ? TURBINATE REDUCTION N/A 01/05/2019  ? Procedure: Turbinate Reduction;  Surgeon: Jerrell Belfast, MD;  Location: [place];  Service: ENT;  Laterality: N/A;  ? ?Family History  ?Problem Relation Age of Onset  ? Mental illness Mother   ? Hypertension Mother   ? Schizophrenia Mother   ? Depression Mother   ? Heart disease Mother   ? Diabetes Mother   ? Cancer Father   ?     LUNG  ? Hypertension Sister   ? Cancer Sister   ?     BREAST  ? Breast cancer Sister   ? Hypertension Sister   ? Heart attack Sister   ? Hypertension Sister   ? Heart attack Sister   ? Mental illness Brother   ? Hypertension Brother   ? Schizophrenia Brother   ? Depression Brother   ? Hypertension Brother   ? Schizophrenia Brother   ? Hypertension Brother   ? Breast cancer Maternal Aunt   ? Schizophrenia Maternal Uncle   ? Heart disease Maternal Grandmother   ? Depression Maternal Grandmother   ? Schizophrenia Maternal Grandmother   ? Allergic rhinitis Neg Hx   ? Angioedema Neg Hx   ? Asthma Neg Hx   ? Atopy Neg Hx   ? Eczema Neg Hx   ? Immunodeficiency Neg Hx   ? Urticaria Neg Hx   ? Colon cancer Neg Hx   ? Colon polyps Neg Hx   ? Stomach cancer Neg Hx   ? Rectal cancer Neg Hx   ? ?Social History  ? ?Socioeconomic  History  ? Marital status: Married  ?  Spouse name: Vangie Bicker  ? Number of children: 4  ? Years of education: Associate Degree  ? Highest education level: Not on file  ?Occupational History  ? Occupation: unemployed  ?Tobacco Use  ? Smoking status:  Former  ?  Packs/day: 0.25  ?  Years: 6.00  ?  Pack years: 1.50  ?  Types: Cigarettes  ?  Quit date: 76  ?  Years since quitting: 36.3  ? Smokeless tobacco: Never  ? Tobacco comments:  ?  QUIT IN HER EARLY 20'S  ?Vaping Use  ? Vaping Use: Never used  ?Substance and Sexual Activity  ? Alcohol use: No  ?  Alcohol/week: 0.0 standard drinks  ? Drug use: No  ? Sexual activity: Not Currently  ?Other Topics Concern  ? Not on file  ?Social History Narrative  ? Pt is R handed  ? Lives in single story home with her husband and father-in-law  ? Has 4 children  ? Associates Degree x2  ? 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 ?Social Determinants of Health  ? ?Financial Resource Strain: Medium Risk  ? Difficulty of Paying Living Expenses: Somewhat hard  ?Food Insecurity: No Food Insecurity  ? Worried About Charity fundraiser in the Last Year: Never true  ? Ran Out of Food in the Last Year: Never true  ?Transportation Needs: No Transportation Needs  ? Lack of Transportation (Medical): No  ? Lack of Transportation (Non-Medical): No  ?Physical Activity: Not on file  ?Stress: Stress Concern Present  ? Feeling of Stress : To some extent  ?Social Connections: Moderately Isolated  ? Frequency of Communication with Friends and Family: Once a week  ? Frequency of Social Gatherings with Friends and Family: Never  ? Attends Religious Services: More than 4 times per year  ? Active Member of Clubs or Organizations: Yes  ? Attends Archivist Meetings: More than 4 times per year  ? Marital Status: Widowed  ? ? ?Tobacco Counseling ?Counseling given: Not Answered ?Tobacco comments: QUIT IN HER EARLY 20'S ? ? ?Clinical  Intake: ? ?Pre-visit preparation completed: No ? ?Pain : No/denies pain ? ?  ? ? ?She state her diet consist of  1.5 meal a day. Normal a meat , vegetable and starch. No snacks in between meals.. She admits that she doesn'

## 2022-04-19 ENCOUNTER — Telehealth (HOSPITAL_BASED_OUTPATIENT_CLINIC_OR_DEPARTMENT_OTHER): Payer: Medicare HMO | Admitting: Psychiatry

## 2022-04-19 ENCOUNTER — Encounter (HOSPITAL_COMMUNITY): Payer: Self-pay | Admitting: Psychiatry

## 2022-04-19 DIAGNOSIS — F200 Paranoid schizophrenia: Secondary | ICD-10-CM

## 2022-04-19 DIAGNOSIS — R69 Illness, unspecified: Secondary | ICD-10-CM | POA: Diagnosis not present

## 2022-04-19 MED ORDER — HALOPERIDOL 5 MG PO TABS: 2.5000 mg | ORAL_TABLET | Freq: Every day | ORAL | 2 refills | Status: DC

## 2022-04-19 NOTE — Progress Notes (Signed)
Virtual Visit via Telephone Note ? ?I connected with Maria Franklin on 04/19/22 at  1:20 PM EDT by telephone and verified that I am speaking with the correct person using two identifiers. ? ?Location: ?Patient: [facility] ?Provider: [facility] ?  ?I discussed the limitations, risks, security and privacy concerns of performing an evaluation and management service by telephone and the availability of in person appointments. I also discussed with the patient that there may be a patient responsible charge related to this service. The patient expressed understanding and agreed to proceed. ? ? ?History of Present Illness: ?Patient is evaluated by phone session.  She is taking Haldol 2.5 mg.  She admitted in the past few months she is sad and dysphoric.  Her son refuses to communicate with her.  Her daughter already not talking to her.  Patient is sad because she did comment on her son's behavior which she believes he did not like and stopped talking.  She feels some time having crying spells but denies any anhedonia, hopelessness or any feeling of worthlessness.  She tried to keep herself busy.  Today she is at a plant nursery and trying to keep herself engaged.  She lives with her husband who is very supportive.  Patient denies any paranoia, hallucination, suicidal thoughts or any anger issues.  She like to keep the Haldol 2.5 mg.  She also reported having financial issues and not able to pay her bills.  She is getting calls from Designer, television/film set.  She is not working.  She had tried working in the past but her symptoms get worse.  Her appetite is okay.  She reported her weight is stable.  She has no tremor or shakes or any EPS. ? ?Psychiatric History: Reviewed. ?H/O paranoia and delusions. H/O overdose and inpatient at [facility]. H/O overdose and inpatient at [facility]. Readmitted in 4 months due to noncompliant with medication. Last admission at [facility] in Nov 2020. Abilify worked  but could not afford. No h/o abuse.   ? ?Psychiatric Specialty Exam: ?Physical Exam  ?Review of Systems  ?Weight 261 lb (118.4 kg), last menstrual period 02/02/2017.There is no height or weight on file to calculate BMI.  ?General Appearance: NA  ?Eye Contact:  NA  ?Speech:  Slow  ?Volume:  Decreased  ?Mood:  Dysphoric  ?Affect:  NA  ?Thought Process:  Goal Directed  ?Orientation:  Full (Time, Place, and Person)  ?Thought Content:  Logical  ?Suicidal Thoughts:  No  ?Homicidal Thoughts:  No  ?Memory:  Immediate;   Good ?Recent;   Good ?Remote;   Good  ?Judgement:  Fair  ?Insight:  Fair  ?Psychomotor Activity:  NA  ?Concentration:  Concentration: Fair and Attention Span: Fair  ?Recall:  Fair  ?Fund of Knowledge:  Good  ?Language:  Good  ?Akathisia:  No  ?Handed:  Right  ?AIMS (if indicated):     ?Assets:  Communication Skills ?Desire for Improvement ?Housing ?Social Support  ?ADL's:  Intact  ?Cognition:  WNL  ?Sleep:   ok  ? ? ? ? ?Assessment and Plan: ?Schizophrenia chronic paranoid type. ? ?I offered therapy to help her coping skills but patient refused as she cannot afford at this time but promised to give [place] a call back if she needed.  She also like to keep the appointment for 6 months.  Encouraged walking and exercise.  She started gardening and trying to spend time in the garden.  Encouraged to do that.  Discussed medication side effects and benefits.  Continue Haldol 2.5 mg daily.  Recommended to call us back if is any question or any concern.  Follow-up in 6 months.   ? ? ?Follow Up Instructions: ? ?  ?I discussed the assessment and treatment plan with the patient. The patient was provided an opportunity to ask questions and all were answered. The patient agreed with the plan and demonstrated an understanding of the instructions. ?  ?The patient was advised to call back or seek an in-person evaluation if the symptoms worsen or if the condition fails to improve as anticipated. ? ?Collaboration of Care: Primary  Care Provider AEB notes are available in epic to review. ? ?Patient/Guardian was advised Release of Information must be obtained prior to any record release in order to collaborate their care with an outside provider. Patient/Guardian was advised if they have not already done so to contact the registration department to sign all necessary forms in order for [place] to release information regarding their care.  ? ?Consent: Patient/Guardian gives verbal consent for treatment and assignment of benefits for services provided during this visit. Patient/Guardian expressed understanding and agreed to proceed.   ? ?I provided 20 minutes of non-face-to-face time during this encounter. ? ? ?Kathlee Nations, MD  ?

## 2022-04-21 ENCOUNTER — Other Ambulatory Visit: Payer: Self-pay | Admitting: Family Medicine

## 2022-04-21 DIAGNOSIS — I10 Essential (primary) hypertension: Secondary | ICD-10-CM

## 2022-04-21 DIAGNOSIS — E039 Hypothyroidism, unspecified: Secondary | ICD-10-CM

## 2022-04-22 NOTE — Addendum Note (Signed)
Addended by: Matilde Sprang on: 04/22/2022 03:49 PM   Modules accepted: Orders

## 2022-04-22 NOTE — Telephone Encounter (Signed)
Pt's Rx's amLODipine (NORVASC) 10 MG tablet and levothyroxine (SYNTHROID) 50 MCG tablet Was last filled by Dr Volanda Napoleon at [place].  Pt sees Rollene Fare now. Needs new Rx's sent to  [id number]- [place], [place] - [address]

## 2022-04-23 MED ORDER — AMLODIPINE BESYLATE 10 MG PO TABS: 10.0000 mg | ORAL_TABLET | Freq: Every day | ORAL | 1 refills | Status: DC

## 2022-04-23 MED ORDER — LEVOTHYROXINE SODIUM 50 MCG PO TABS: ORAL_TABLET | ORAL | 1 refills | Status: DC

## 2022-05-04 ENCOUNTER — Encounter: Payer: Self-pay | Admitting: Internal Medicine

## 2022-05-04 ENCOUNTER — Ambulatory Visit (INDEPENDENT_AMBULATORY_CARE_PROVIDER_SITE_OTHER): Payer: Medicare HMO | Admitting: Internal Medicine

## 2022-05-04 VITALS — BP 136/80 | HR 64 | Temp 96.9°F | Wt 259.0 lb

## 2022-05-04 DIAGNOSIS — G4733 Obstructive sleep apnea (adult) (pediatric): Secondary | ICD-10-CM

## 2022-05-04 DIAGNOSIS — R69 Illness, unspecified: Secondary | ICD-10-CM | POA: Diagnosis not present

## 2022-05-04 DIAGNOSIS — J4540 Moderate persistent asthma, uncomplicated: Secondary | ICD-10-CM | POA: Diagnosis not present

## 2022-05-04 DIAGNOSIS — E039 Hypothyroidism, unspecified: Secondary | ICD-10-CM | POA: Diagnosis not present

## 2022-05-04 DIAGNOSIS — K219 Gastro-esophageal reflux disease without esophagitis: Secondary | ICD-10-CM

## 2022-05-04 DIAGNOSIS — E782 Mixed hyperlipidemia: Secondary | ICD-10-CM

## 2022-05-04 DIAGNOSIS — N1831 Chronic kidney disease, stage 3a: Secondary | ICD-10-CM | POA: Diagnosis not present

## 2022-05-04 DIAGNOSIS — K122 Cellulitis and abscess of mouth: Secondary | ICD-10-CM

## 2022-05-04 DIAGNOSIS — E1122 Type 2 diabetes mellitus with diabetic chronic kidney disease: Secondary | ICD-10-CM | POA: Diagnosis not present

## 2022-05-04 DIAGNOSIS — I10 Essential (primary) hypertension: Secondary | ICD-10-CM | POA: Diagnosis not present

## 2022-05-04 DIAGNOSIS — F200 Paranoid schizophrenia: Secondary | ICD-10-CM

## 2022-05-04 MED ORDER — ATORVASTATIN CALCIUM 40 MG PO TABS: ORAL_TABLET | ORAL | 1 refills | Status: DC

## 2022-05-04 MED ORDER — LISINOPRIL 40 MG PO TABS: 40.0000 mg | ORAL_TABLET | Freq: Every day | ORAL | 1 refills | Status: DC

## 2022-05-04 NOTE — Assessment & Plan Note (Signed)
C-Met and lipid profile today Encouraged her to consume a low-fat diet Continue Atorvastatin

## 2022-05-04 NOTE — Assessment & Plan Note (Signed)
Continue Haloperidol per psychiatry We will monitor

## 2022-05-04 NOTE — Assessment & Plan Note (Signed)
Encourage weight loss as this can help sleep apnea symptoms She is not wearing her CPAP

## 2022-05-04 NOTE — Assessment & Plan Note (Signed)
Avoid foods that trigger reflux Encourage weight loss as this can help reduce reflux symptoms Continue Omeprazole

## 2022-05-04 NOTE — Assessment & Plan Note (Signed)
Continue Advair and Albuterol She will continue to follow pulmonology

## 2022-05-04 NOTE — Assessment & Plan Note (Signed)
A1c and urine microalbumin today Encouraged her to consume a low carb diet and exercise for weight loss No meds Eye exam UTD Encourage routine foot exams Encouraged her to get a flu shot in the fall Pneumovax UTD Encouraged her to get her COVID booster

## 2022-05-04 NOTE — Assessment & Plan Note (Signed)
C-Met today Continue Lisinopril for renal protection

## 2022-05-04 NOTE — Patient Instructions (Signed)

## 2022-05-04 NOTE — Assessment & Plan Note (Signed)
Controlled on Amlodipine, Carvedilol, Hydralazine and Lisinopril Reinforced DASH diet and exercise for weight loss C-Met today

## 2022-05-04 NOTE — Assessment & Plan Note (Signed)
C-Met and lipid profile today ?Encouraged her to consume a low-fat diet  ?Continue Atorvastatin and Aspirin ? ?

## 2022-05-04 NOTE — Progress Notes (Signed)
Subjective:    Patient ID: Maria Franklin, female    DOB: 11-05-1961, 61 y.o.   MRN: [id number]  HPI  Pt presents to the clinic today for follow up of chronic conditions.  HTN: Her BP today is 136/80.  She is taking Amlodipine, Carvedilol, Hydralazine and Lisinopril as prescribed.  ECG from 11/2020 reviewed.  HLD with Ludewig's Angina: Her last LDL was 70, triglycerides 78, 10/2021.  She denies myalgias on Atorvastatin.  She is taking an Aspirin daily.  She tries to consume a low-fat diet.  GERD: Triggered by eating late at night.  She denies breakthrough on Omeprazole.  There is no upper GI on file.  Asthma: She denies chronic cough or shortness of breath.  She is not using Advair but is using Albuterol as needed with good relief of symptoms.  PFTs from 01/2022 reviewed.  She follows with pulmonology.  OSA: She averages 8 hours of sleep per night without the use of her CPAP.  Sleep study from 10/2019 reviewed.  Hypothyroidism: She denies any issues on her current dose of Levothyroxine.  She does not follow with endocrinology.  DM2: Her last A1c was 6.3%, 10/2021.  She is not currently taking any oral diabetic medication.  She does not check her sugars.  She checks her feet routinely.  Her last eye exam was 09/2021.  Flu 09/2021.  Pneumovax 03/2019.  [place] x3.  CKD 3: Her last creatinine was 1.5, GFR 40, 10/2019.  She is taking Lisinopril for renal protection.  She does not follow with nephrology.  Paranoid Schizophrenia: Managed with Haloperidol.  She denies any audio/visual hallucinations or paranoia at this time.  She follows with psychiatry.  Review of Systems     Past Medical History:  Diagnosis Date   Allergy    Anemia    Angio-edema    Asthma    ALL THE TIME   LAST FLARE UP 12/2018   Chest pain 08/2019   Chicken pox    Chronic kidney disease    Diabetes mellitus without complication (HCC)    GERD (gastroesophageal reflux disease)    Hyperlipidemia     Hypertension    Hyperthyroidism    Recurrent upper respiratory infection (URI)    Schizophrenia (HCC)    Sleep apnea    DX 2-3 YRS AGO.   Sleep apnea    Thyroid disease    Urticaria     Current Outpatient Medications  Medication Sig Dispense Refill   ADVAIR DISKUS 250-50 MCG/ACT AEPB Inhale 1 puff into the lungs in the morning and at bedtime. 60 each 5   albuterol (PROVENTIL) (2.5 MG/3ML) 0.083% nebulizer solution Take 3 mLs (2.5 mg total) by nebulization every 4 (four) hours as needed for wheezing or shortness of breath. 75 mL 1   albuterol (VENTOLIN HFA) 108 (90 Base) MCG/ACT inhaler INHALE 1 TO 2 PUFFS INTO THE LUNGS EVERY 6 HOURS AS NEEDED WHEEZING/SHORTNESS OF BREATH 18 each 1   amLODipine (NORVASC) 10 MG tablet Take 1 tablet (10 mg total) by mouth daily. 90 tablet 1   aspirin EC 81 MG EC tablet Take 1 tablet (81 mg total) by mouth daily. 30 tablet 0   atorvastatin (LIPITOR) 40 MG tablet TAKE 1 TABLET BY MOUTH EVERY DAY AT 6PM 90 tablet 1   b complex vitamins capsule Take 1 capsule by mouth daily.     Boswellia-Glucosamine-Vit D (OSTEO BI-FLEX ONE PER DAY PO) Take by mouth.     carvedilol (COREG) 12.5 MG  tablet TAKE 1 TABLET (12.5 MG TOTAL) BY MOUTH 2 (TWO) TIMES DAILY WITH A MEAL. 180 tablet 0   cetirizine (ZYRTEC) 10 MG tablet Take 1 tablet (10 mg total) by mouth daily. 30 tablet 5   Ferrous Sulfate (IRON) 325 (65 Fe) MG TABS Take 1 tablet by mouth every morning.     fluticasone (FLONASE) 50 MCG/ACT nasal spray SPRAY 2 SPRAYS INTO EACH NOSTRIL EVERY DAY 16 mL 5   haloperidol (HALDOL) 5 MG tablet Take 0.5 tablets (2.5 mg total) by mouth at bedtime. Take 1/2 tab at bed time 30 tablet 2   hydrALAZINE (APRESOLINE) 25 MG tablet TAKE 1/2 TABLET BY MOUTH EVERY MORNING AND AT BEDTIME 90 tablet 0   levothyroxine (SYNTHROID) 50 MCG tablet TAKE 1 TABLET BY MOUTH EVERY DAY BEFORE BREAKFAST 90 tablet 1   lisinopril (ZESTRIL) 40 MG tablet TAKE 1 TABLET BY MOUTH EVERYDAY 90 tablet 0   Multiple  Vitamins-Minerals (MULTIVITAMIN WITH MINERALS) tablet Take 1 tablet by mouth every evening.     omeprazole (PRILOSEC) 40 MG capsule TAKE 1 CAPSULE BY MOUTH EVERY DAY 90 capsule 1   terbinafine (LAMISIL) 250 MG tablet Take 1 tablet (250 mg total) by mouth daily. (Patient not taking: Reported on 04/06/2022) 60 tablet 0   No current facility-administered medications for this visit.    Allergies  Allergen Reactions   Bee Pollen Cough   Pollen Extract Cough   Latex Itching, Rash and Other (See Comments)    Severe itching    Family History  Problem Relation Age of Onset   Mental illness Mother    Hypertension Mother    Schizophrenia Mother    Depression Mother    Heart disease Mother    Diabetes Mother    Cancer Father        LUNG   Hypertension Sister    Cancer Sister        BREAST   Breast cancer Sister    Hypertension Sister    Heart attack Sister    Hypertension Sister    Heart attack Sister    Mental illness Brother    Hypertension Brother    Schizophrenia Brother    Depression Brother    Hypertension Brother    Schizophrenia Brother    Hypertension Brother    Breast cancer Maternal Aunt    Schizophrenia Maternal Uncle    Heart disease Maternal Grandmother    Depression Maternal Grandmother    Schizophrenia Maternal Grandmother    Allergic rhinitis Neg Hx    Angioedema Neg Hx    Asthma Neg Hx    Atopy Neg Hx    Eczema Neg Hx    Immunodeficiency Neg Hx    Urticaria Neg Hx    Colon cancer Neg Hx    Colon polyps Neg Hx    Stomach cancer Neg Hx    Rectal cancer Neg Hx     Social History   Socioeconomic History   Marital status: Married    Spouse name: Vangie Bicker   Number of children: 4   Years of education: Associate Degree   Highest education level: Not on file  Occupational History   Occupation: unemployed  Tobacco Use   Smoking status: Former    Packs/day: 0.25    Years: 6.00    Pack years: 1.50    Types: Cigarettes    Quit date: 1987     Years since quitting: 36.4   Smokeless tobacco: Never   Tobacco comments:  QUIT IN HER EARLY 20'S  Vaping Use   Vaping Use: Never used  Substance and Sexual Activity   Alcohol use: No    Alcohol/week: 0.0 standard drinks   Drug use: No   Sexual activity: Not Currently  Other Topics Concern   Not on file  Social History Narrative   Pt is R handed   Lives in single story home with her husband and father-in-law   Has 4 children   Associates Degree x2   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   Social Determinants of Health   Financial Resource Strain: Medium Risk   Difficulty of Paying Living Expenses: Somewhat hard  Food Insecurity: No Food Insecurity   Worried About Charity fundraiser in the Last Year: Never true   Arboriculturist in the Last Year: Never true  Transportation Needs: No Transportation Needs   Lack of Transportation (Medical): No   Lack of Transportation (Non-Medical): No  Physical Activity: Not on file  Stress: Stress Concern Present   Feeling of Stress : To some extent  Social Connections: Moderately Isolated   Frequency of Communication with Friends and Family: Once a week   Frequency of Social Gatherings with Friends and Family: Never   Attends Religious Services: More than 4 times per year   Active Member of [employer] or Organizations: Yes   Attends Archivist Meetings: More than 4 times per year   Marital Status: Widowed  Intimate Partner Violence: Not on file     Constitutional: Denies fever, malaise, fatigue, headache or abrupt weight changes.  HEENT: Denies eye pain, eye redness, ear pain, ringing in the ears, wax buildup, runny nose, nasal congestion, bloody nose, or sore throat. Respiratory: Denies difficulty breathing, shortness of breath, cough or sputum production.   Cardiovascular: Denies chest pain, chest tightness, palpitations or swelling in the hands or feet.  Gastrointestinal:  Denies abdominal pain, bloating, constipation, diarrhea or blood in the stool.  GU: Denies urgency, frequency, pain with urination, burning sensation, blood in urine, odor or discharge. Musculoskeletal: Denies decrease in range of motion, difficulty with gait, muscle pain or joint pain and swelling.  Skin: Denies redness, rashes, lesions or ulcercations.  Neurological: Denies dizziness, difficulty with memory, difficulty with speech or problems with balance and coordination.  Psych: Denies anxiety, depression, SI/HI.  No other specific complaints in a complete review of systems (except as listed in HPI above).  Objective:   Physical Exam   BP 136/80 (BP Location: Right Arm, Patient Position: Sitting, Cuff Size: Large)   Pulse 64   Temp (!) 96.9 F (36.1 C) (Temporal)   Wt 259 lb (117.5 kg)   LMP 02/02/2017 (Approximate)   SpO2 98%   BMI 45.88 kg/m   Wt Readings from Last 3 Encounters:  01/28/22 261 lb (118.4 kg)  12/24/21 269 lb 6.4 oz (122.2 kg)  12/21/21 260 lb (117.9 kg)    General: Appears her stated age, obese, in NAD. Skin: Warm, dry and intact. No ulcerations noted. HEENT: Head: normal shape and size; Eyes: PERRLA and EOMs intact;  Neck:  Neck supple, trachea midline. No masses, lumps or thyromegaly present.  Cardiovascular: Normal rate and rhythm. S1,S2 noted.  No murmur, rubs or gallops noted. No JVD or BLE edema. No carotid bruits noted. Pulmonary/Chest: Normal effort and positive vesicular breath sounds. No respiratory distress. No wheezes, rales or ronchi noted.  Abdomen: Soft and nontender. Normal bowel sounds.  Musculoskeletal: No difficulty with gait.  Neurological: Alert and oriented. Cranial nerves II-XII grossly intact. Coordination normal.  Psychiatric: Mood and affect normal. Behavior is normal. Judgment and thought content normal.    BMET    Component Value Date/Time   NA 141 11/02/2021 1031   K 5.1 11/02/2021 1031   CL 106 11/02/2021 1031   CO2 29  11/02/2021 1031   GLUCOSE 103 (H) 11/02/2021 1031   BUN 25 11/02/2021 1031   CREATININE 1.50 (H) 11/02/2021 1031   CALCIUM 9.2 11/02/2021 1031   GFRNONAA 44 (L) 11/18/2020 0939   GFRNONAA 53 (L) 07/28/2020 0942   GFRAA 61 07/28/2020 0942    Lipid Panel     Component Value Date/Time   CHOL 153 11/02/2021 1031   TRIG 78 11/02/2021 1031   HDL 67 11/02/2021 1031   CHOLHDL 2.3 11/02/2021 1031   VLDL 18 10/17/2019 0630   LDLCALC 70 11/02/2021 1031    CBC    Component Value Date/Time   WBC 7.0 11/02/2021 1031   RBC 3.96 11/02/2021 1031   HGB 10.9 (L) 11/02/2021 1031   HCT 34.7 (L) 11/02/2021 1031   PLT 268 11/02/2021 1031   MCV 87.6 11/02/2021 1031   MCH 27.5 11/02/2021 1031   MCHC 31.4 (L) 11/02/2021 1031   RDW 12.7 11/02/2021 1031   LYMPHSABS 2.6 11/18/2020 0939   MONOABS 0.5 11/18/2020 0939   EOSABS 0.4 11/18/2020 0939   BASOSABS 0.0 11/18/2020 0939    Hgb A1C Lab Results  Component Value Date   HGBA1C 6.3 (H) 11/02/2021           Assessment & Plan:     RTC in 6 months, follow up chronic conditions  Webb Silversmith, NP

## 2022-05-04 NOTE — Assessment & Plan Note (Signed)
TSH and free T4 today We will adjust Levothyroxine if needed based on labs 

## 2022-05-04 NOTE — Assessment & Plan Note (Signed)
Encourage diet and exercise for weight loss 

## 2022-05-05 LAB — MICROALBUMIN / CREATININE URINE RATIO
Creatinine, Urine: 185 mg/dL (ref 20–275)
Microalb Creat Ratio: 3 mcg/mg creat (ref <30)
Microalb, Ur: 0.5 mg/dL

## 2022-05-05 LAB — COMPLETE METABOLIC PANEL WITH GFR
AG Ratio: 1.5 (calc) (ref 1.0–2.5)
ALT: 15 U/L (ref 6–29)
AST: 18 U/L (ref 10–35)
Albumin: 4.4 g/dL (ref 3.6–5.1)
Alkaline phosphatase (APISO): 92 U/L (ref 37–153)
BUN/Creatinine Ratio: 11 (calc) (ref 6–22)
BUN: 15 mg/dL (ref 7–25)
CO2: 27 mmol/L (ref 20–32)
Calcium: 9.8 mg/dL (ref 8.6–10.4)
Chloride: 103 mmol/L (ref 98–110)
Creat: 1.35 mg/dL — ABNORMAL HIGH (ref 0.50–1.05)
Globulin: 3 g/dL (calc) (ref 1.9–3.7)
Glucose, Bld: 106 mg/dL — ABNORMAL HIGH (ref 65–99)
Potassium: 4.6 mmol/L (ref 3.5–5.3)
Sodium: 140 mmol/L (ref 135–146)
Total Bilirubin: 0.4 mg/dL (ref 0.2–1.2)
Total Protein: 7.4 g/dL (ref 6.1–8.1)
eGFR: 45 mL/min/{1.73_m2} — ABNORMAL LOW (ref >=60)

## 2022-05-05 LAB — CBC
HCT: 36.8 % (ref 35.0–45.0)
Hemoglobin: 11.8 g/dL (ref 11.7–15.5)
MCH: 28.1 pg (ref 27.0–33.0)
MCHC: 32.1 g/dL (ref 32.0–36.0)
MCV: 87.6 fL (ref 80.0–100.0)
MPV: 9.9 fL (ref 7.5–12.5)
Platelets: 251 10*3/uL (ref 140–400)
RBC: 4.2 10*6/uL (ref 3.80–5.10)
RDW: 13 % (ref 11.0–15.0)
WBC: 7.2 10*3/uL (ref 3.8–10.8)

## 2022-05-05 LAB — LIPID PANEL
Cholesterol: 161 mg/dL (ref <200)
HDL: 73 mg/dL (ref >=50)
LDL Cholesterol (Calc): 68 mg/dL (calc)
Non-HDL Cholesterol (Calc): 88 mg/dL (calc) (ref <130)
Total CHOL/HDL Ratio: 2.2 (calc) (ref <5.0)
Triglycerides: 114 mg/dL (ref <150)

## 2022-05-05 LAB — HEMOGLOBIN A1C
Hgb A1c MFr Bld: 6.2 % of total Hgb — ABNORMAL HIGH (ref <5.7)
Mean Plasma Glucose: 131 mg/dL
eAG (mmol/L): 7.3 mmol/L

## 2022-05-05 LAB — T4, FREE: Free T4: 1.3 ng/dL (ref 0.8–1.8)

## 2022-05-05 LAB — TSH: TSH: 1.67 mIU/L (ref 0.40–4.50)

## 2022-05-06 ENCOUNTER — Telehealth (HOSPITAL_COMMUNITY): Payer: Medicare HMO | Admitting: Psychiatry

## 2022-05-11 DIAGNOSIS — R69 Illness, unspecified: Secondary | ICD-10-CM | POA: Diagnosis not present

## 2022-05-11 DIAGNOSIS — E039 Hypothyroidism, unspecified: Secondary | ICD-10-CM | POA: Diagnosis not present

## 2022-05-11 DIAGNOSIS — K219 Gastro-esophageal reflux disease without esophagitis: Secondary | ICD-10-CM | POA: Diagnosis not present

## 2022-05-11 DIAGNOSIS — J301 Allergic rhinitis due to pollen: Secondary | ICD-10-CM | POA: Diagnosis not present

## 2022-05-11 DIAGNOSIS — Z008 Encounter for other general examination: Secondary | ICD-10-CM | POA: Diagnosis not present

## 2022-05-11 DIAGNOSIS — Z7982 Long term (current) use of aspirin: Secondary | ICD-10-CM | POA: Diagnosis not present

## 2022-05-11 DIAGNOSIS — Z6841 Body Mass Index (BMI) 40.0 and over, adult: Secondary | ICD-10-CM | POA: Diagnosis not present

## 2022-05-11 DIAGNOSIS — I10 Essential (primary) hypertension: Secondary | ICD-10-CM | POA: Diagnosis not present

## 2022-05-11 DIAGNOSIS — E119 Type 2 diabetes mellitus without complications: Secondary | ICD-10-CM | POA: Diagnosis not present

## 2022-05-11 DIAGNOSIS — J449 Chronic obstructive pulmonary disease, unspecified: Secondary | ICD-10-CM | POA: Diagnosis not present

## 2022-05-11 DIAGNOSIS — E785 Hyperlipidemia, unspecified: Secondary | ICD-10-CM | POA: Diagnosis not present

## 2022-05-11 DIAGNOSIS — Z7951 Long term (current) use of inhaled steroids: Secondary | ICD-10-CM | POA: Diagnosis not present

## 2022-06-04 ENCOUNTER — Ambulatory Visit
Admission: RE | Admit: 2022-06-04 | Discharge: 2022-06-04 | Disposition: A | Discharge status: Home / Self Care | Payer: Medicare HMO | Source: Ambulatory Visit | Attending: Internal Medicine | Admitting: Internal Medicine

## 2022-06-04 DIAGNOSIS — Z780 Asymptomatic menopausal state: Secondary | ICD-10-CM | POA: Diagnosis not present

## 2022-06-10 ENCOUNTER — Other Ambulatory Visit: Payer: Self-pay | Admitting: Internal Medicine

## 2022-06-10 NOTE — Telephone Encounter (Signed)
Requested Prescriptions  Pending Prescriptions Disp Refills  . carvedilol (COREG) 12.5 MG tablet [Pharmacy Med Name: CARVEDILOL 12.5 MG TABLET] 180 tablet 0    Sig: TAKE 1 TABLET (12.'5MG'$  TOTAL) BY MOUTH TWICE A DAY WITH MEALS     Cardiovascular: Beta Blockers 3 Failed - 06/10/2022  2:14 PM      Failed - Cr in normal range and within 360 days    Creat  Date Value Ref Range Status  05/04/2022 1.35 (H) 0.50 - 1.05 mg/dL Final   Creatinine,U  Date Value Ref Range Status  07/25/2019 153.6 mg/dL Final   Creatinine, Urine  Date Value Ref Range Status  05/04/2022 185 20 - 275 mg/dL Final         Passed - AST in normal range and within 360 days    AST  Date Value Ref Range Status  05/04/2022 18 10 - 35 U/L Final         Passed - ALT in normal range and within 360 days    ALT  Date Value Ref Range Status  05/04/2022 15 6 - 29 U/L Final         Passed - Last BP in normal range    BP Readings from Last 1 Encounters:  05/04/22 136/80         Passed - Last Heart Rate in normal range    Pulse Readings from Last 1 Encounters:  05/04/22 64         Passed - Valid encounter within last 6 months    Recent Outpatient Visits          1 month ago Type 2 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 disease, without long-term current use of insulin ([place])   [facility] [place], Coralie Keens, NP   5 months ago Postmenopausal vaginal bleeding   [place], Coralie Keens, NP   7 months ago Encounter for general adult medical examination with abnormal findings   [place], Coralie Keens, NP   10 months ago Excessive daytime sleepiness   [facility] [place], [place] W, NP   1 year ago Type 2 diabetes mellitus without complication, without long-term current use of insulin ([place])   [facility] [place], Coralie Keens, NP      Future Appointments            In 3 months [place] Bowler, Gwenith Daily, MD Allergy and [place]   In 4 months [place], Coralie Keens, NP [facility], [facility]

## 2022-06-11 ENCOUNTER — Other Ambulatory Visit: Payer: Self-pay | Admitting: Internal Medicine

## 2022-06-11 DIAGNOSIS — I10 Essential (primary) hypertension: Secondary | ICD-10-CM

## 2022-06-11 NOTE — Telephone Encounter (Signed)
Requested Prescriptions  Pending Prescriptions Disp Refills  . hydrALAZINE (APRESOLINE) 25 MG tablet [Pharmacy Med Name: HYDRALAZINE 25 MG TABLET] 90 tablet 0    Sig: TAKE 1/2 TABLET BY MOUTH EVERY MORNING AND AT BEDTIME     Cardiovascular:  Vasodilators Passed - 06/11/2022  1:57 AM      Passed - HCT in normal range and within 360 days    HCT  Date Value Ref Range Status  05/04/2022 36.8 35.0 - 45.0 % Final         Passed - HGB in normal range and within 360 days    Hemoglobin  Date Value Ref Range Status  05/04/2022 11.8 11.7 - 15.5 g/dL Final         Passed - RBC in normal range and within 360 days    RBC  Date Value Ref Range Status  05/04/2022 4.20 3.80 - 5.10 Million/uL Final         Passed - WBC in normal range and within 360 days    WBC  Date Value Ref Range Status  05/04/2022 7.2 3.8 - 10.8 Thousand/uL Final         Passed - PLT in normal range and within 360 days    Platelets  Date Value Ref Range Status  05/04/2022 251 140 - 400 Thousand/uL Final         Passed - ANA Screen, Ifa, Serum in normal range and within 360 days    Anti Nuclear Antibody (ANA)  Date Value Ref Range Status  07/22/2021 NEGATIVE NEGATIVE Final    Comment:    ANA IFA is a first line screen for detecting the presence of up to approximately 150 autoantibodies in various autoimmune diseases. A negative ANA IFA result suggests an ANA-associated autoimmune disease is not present at this time, but is not definitive. If there is high clinical suspicion for Sjogren's syndrome, testing for anti-SS-A/Ro antibody should be considered. Anti-Jo-1 antibody should be considered for clinically suspected inflammatory myopathies. . AC-0: Negative . International Consensus on ANA Patterns (https://www.hernandez-brewer.com/) . For additional information, please refer to http://education.[facility].com/faq/FAQ177 (This link is being provided for informational/ educational purposes  only.) .          Passed - Last BP in normal range    BP Readings from Last 1 Encounters:  05/04/22 136/80         Passed - Valid encounter within last 12 months    Recent Outpatient Visits          1 month ago Type 2 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 disease, without long-term current use of insulin ([place])   [facility] [place], Maria Keens, NP   5 months ago Postmenopausal vaginal bleeding   [facility] [place], Maria Keens, NP   7 months ago Encounter for general adult medical examination with abnormal findings   [facility] [place], Maria Keens, NP   10 months ago Excessive daytime sleepiness   [facility] [place], [place] W, NP   1 year ago Type 2 diabetes mellitus without complication, without long-term current use of insulin [facility])   [facility] [place], Maria Keens, NP      Future Appointments            In 3 months Ernst Bowler, Maria Daily, MD Allergy and [place]   In 4 months [place], Maria Keens, NP [facility], [facility]

## 2022-07-19 ENCOUNTER — Other Ambulatory Visit (HOSPITAL_COMMUNITY): Payer: Self-pay | Admitting: Psychiatry

## 2022-07-19 DIAGNOSIS — F200 Paranoid schizophrenia: Secondary | ICD-10-CM

## 2022-07-28 DIAGNOSIS — I129 Hypertensive chronic kidney disease with stage 1 through stage 4 chronic kidney disease, or unspecified chronic kidney disease: Secondary | ICD-10-CM | POA: Diagnosis not present

## 2022-07-28 DIAGNOSIS — N189 Chronic kidney disease, unspecified: Secondary | ICD-10-CM | POA: Diagnosis not present

## 2022-07-28 DIAGNOSIS — N1832 Chronic kidney disease, stage 3b: Secondary | ICD-10-CM | POA: Diagnosis not present

## 2022-07-28 DIAGNOSIS — D631 Anemia in chronic kidney disease: Secondary | ICD-10-CM | POA: Diagnosis not present

## 2022-07-28 DIAGNOSIS — N2581 Secondary hyperparathyroidism of renal origin: Secondary | ICD-10-CM | POA: Diagnosis not present

## 2022-08-25 ENCOUNTER — Encounter (HOSPITAL_COMMUNITY): Payer: Self-pay

## 2022-08-25 ENCOUNTER — Ambulatory Visit (INDEPENDENT_AMBULATORY_CARE_PROVIDER_SITE_OTHER): Payer: Medicare HMO | Admitting: Licensed Clinical Social Worker

## 2022-08-25 DIAGNOSIS — F200 Paranoid schizophrenia: Secondary | ICD-10-CM | POA: Diagnosis not present

## 2022-08-25 DIAGNOSIS — R69 Illness, unspecified: Secondary | ICD-10-CM | POA: Diagnosis not present
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
## 2022-08-25 NOTE — Plan of Care (Signed)
  Problem: Depression Goal: Decrease depressive symptoms and improve levels of effective functioning-pt reports a decrease in overall depression symptoms 3 out of 5 sessions documented.  Outcome: Initial Goal: Develop healthy thinking patterns and beliefs about self, others, and the world that lead to the alleviation and help prevent the relapse of depression per self report 3 out of 5 sessions documented.   Outcome: Initial   Problem: Anxiety  Goal:  Reduce overall frequency, intensity, and duration of the anxiety so that daily functioning is not impaired per pt self report 3 out of 5 sessions documented.   Outcome: Initial Goal: Learn and implement coping skills that result in a reduction of anxiety and worry, and improve daily functioning per pt report 3 out of 5 sessions documented  Outcome: Initial    Developed/revised tx plan based on pt self reported input. Pt verbally agrees with treatment plan at time of session and consents to sign digitally.

## 2022-09-07 ENCOUNTER — Telehealth: Payer: Self-pay

## 2022-09-07 NOTE — Chronic Care Management (AMB) (Unsigned)
  Care Coordination  Outreach Note  09/07/2022 Name: Maria Franklin MRN: [id number] DOB: 1961/07/16   Care Coordination Outreach Attempts: An unsuccessful telephone outreach was attempted today to offer the patient information about available care coordination services as a benefit of their health plan.   Follow Up Plan:  Additional outreach attempts will be made to offer the patient care coordination information and services.   Encounter Outcome:  No Answer  Sig Noreene Larsson, [place], [place] [address] Direct Dial: [phone] [email or web address]

## 2022-09-08 ENCOUNTER — Telehealth: Payer: Self-pay

## 2022-09-08 NOTE — Chronic Care Management (AMB) (Signed)
Opened in error

## 2022-09-08 NOTE — Chronic Care Management (AMB) (Signed)
  Care Coordination   Note   09/08/2022 Name: Maria Franklin MRN: [id number] DOB: 11/25/1961  Maria Franklin is a 61 y.o. year old female who sees Baity, Coralie Keens, NP for primary care. I reached out to [name] by phone today to offer care coordination services.  [name] was given information about Care Coordination services today including:   The Care Coordination services include support from the care team which includes your Nurse Coordinator, Clinical Social Worker, or Pharmacist.  The Care Coordination team is here to help remove barriers to the health concerns and goals most important to you. Care Coordination services are voluntary, and the patient may decline or stop services at any time by request to their care team member.   Care Coordination Consent Status: Patient did not agree to participate in care coordination services at this time.    Encounter Outcome:  Pt. Refused  Noreene Larsson, [place], [place] [address] Direct Dial: [phone] [email or web address]

## 2022-09-14 ENCOUNTER — Other Ambulatory Visit: Payer: Self-pay | Admitting: Internal Medicine

## 2022-09-14 ENCOUNTER — Telehealth (HOSPITAL_BASED_OUTPATIENT_CLINIC_OR_DEPARTMENT_OTHER): Payer: Medicare HMO | Admitting: Psychiatry

## 2022-09-14 ENCOUNTER — Encounter (HOSPITAL_COMMUNITY): Payer: Self-pay | Admitting: Psychiatry

## 2022-09-14 VITALS — Wt 256.0 lb

## 2022-09-14 DIAGNOSIS — F200 Paranoid schizophrenia: Secondary | ICD-10-CM

## 2022-09-14 DIAGNOSIS — R69 Illness, unspecified: Secondary | ICD-10-CM | POA: Diagnosis not present

## 2022-09-14 DIAGNOSIS — F411 Generalized anxiety disorder: Secondary | ICD-10-CM

## 2022-09-14 MED ORDER — SERTRALINE HCL 50 MG PO TABS: 50.0000 mg | ORAL_TABLET | Freq: Every day | ORAL | 1 refills | Status: DC

## 2022-09-14 NOTE — Progress Notes (Signed)
Virtual Visit via Telephone Note  I connected with Maria Franklin on 09/14/22 at  2:00 PM EDT by telephone and verified that I am speaking with the correct person using two identifiers.  Location: Patient: In Car Provider: Home Office   I discussed the limitations, risks, security and privacy concerns of performing an evaluation and management service by telephone and the availability of in person appointments. I also discussed with the patient that there may be a patient responsible charge related to this service. The patient expressed understanding and agreed to proceed.   History of Present Illness: Patient is evaluated by phone session.  She reported increased anxiety and depression lately.  She had a visit with Margreta Journey and she noticed it was very overwhelming.  She is sleeping okay.  She denies any paranoia or any hallucination but had a lot of ruminative thoughts about herself.  She feels very sad that her daughter and her son not talking to her.  Her husband lost his job and she is very worried about paying the bills.  She is not working.  She like to try something to help her anxiety and depression.  She is taking Haldol 2.5 mg and does not want to go up the dose.  She feels her paranoia, hallucinations are stable on low-dose Haldol.  She has no tremors, shakes or any EPS.  She endorsed a lot of financial issues and not sure if she able to keep her appointment with therapist because she does not have money for co-pay.  She also like to move med management appointment a few months but wondering if she can take something to help her anxiety.  She denies any suicidal thoughts or homicidal thoughts.  Her energy level is fair.  She denies drinking or using any illegal substances.  Psychiatric History: Reviewed. H/O paranoia and delusions. H/O overdose and inpatient at [facility]. H/O overdose and inpatient at [facility]. Readmitted in 4 months due to noncompliant with  medication. Last admission at [facility] in Nov 2020. Abilify worked but could not afford. No h/o abuse.    Psychiatric Specialty Exam: Physical Exam  Review of Systems  Weight 256 lb (116.1 kg), last menstrual period 02/02/2017.There is no height or weight on file to calculate BMI.  General Appearance: NA  Eye Contact:  NA  Speech:  Slow  Volume:  Decreased  Mood:  Anxious and Dysphoric  Affect:  NA  Thought Process:  Descriptions of Associations: Intact  Orientation:  Full (Time, Place, and Person)  Thought Content:  Rumination  Suicidal Thoughts:  No  Homicidal Thoughts:  No  Memory:  Immediate;   Fair Recent;   Fair Remote;   Fair  Judgement:  Fair  Insight:  Shallow  Psychomotor Activity:  NA  Concentration:  Concentration: Fair and Attention Span: Fair  Recall:  [employer] of Knowledge:  Fair  Language:  Fair  Akathisia:  No  Handed:  Right  AIMS (if indicated):     Assets:  Communication Skills Desire for Improvement Housing  ADL's:  Intact  Cognition:  WNL  Sleep:   ok      Assessment and Plan: Schizophrenia chronic paranoid type.  Generalized anxiety disorder.  Patient has 1 session with therapist however she reported it was overwhelming and she feels her anxiety got worse.  She like to try medicine for depression and anxiety.  We discussed trying low-dose Zoloft and she agreed with the plan.  She does not  want to change her Haldol and like to keep the 2.5 mg daily.  I encouraged should consider therapy to help her coping skills since she has a lot of family issues.  Patient will try but hoping her husband able to get a job so she can pay the bills.  We will try Zoloft 25 mg for 1 week and then 50 mg daily.  Discussed medication side effects and benefits.  Recommended to call us back if she is any question or any concern.  Follow-up in 2 months.  Follow Up Instructions:    I discussed the assessment and treatment plan with the patient. The patient was  provided an opportunity to ask questions and all were answered. The patient agreed with the plan and demonstrated an understanding of the instructions.   The patient was advised to call back or seek an in-person evaluation if the symptoms worsen or if the condition fails to improve as anticipated.  Collaboration of Care: Primary Care Provider AEB notes are available in epic.  Patient/Guardian was advised Release of Information must be obtained prior to any record release in order to collaborate their care with an outside provider. Patient/Guardian was advised if they have not already done so to contact the registration department to sign all necessary forms in order for [place] to release information regarding their care.   Consent: Patient/Guardian gives verbal consent for treatment and assignment of benefits for services provided during this visit. Patient/Guardian expressed understanding and agreed to proceed.    I provided 18 minutes of non-face-to-face time during this encounter.   Kathlee Nations, MD

## 2022-09-14 NOTE — Telephone Encounter (Signed)
Requested Prescriptions  Pending Prescriptions Disp Refills  . omeprazole (PRILOSEC) 40 MG capsule [Pharmacy Med Name: OMEPRAZOLE DR 40 MG CAPSULE] 90 capsule 1    Sig: TAKE 1 CAPSULE BY MOUTH EVERY DAY     There is no refill protocol information for this order

## 2022-09-28 ENCOUNTER — Ambulatory Visit: Payer: Medicare HMO | Admitting: Allergy & Immunology

## 2022-09-28 ENCOUNTER — Encounter: Payer: Self-pay | Admitting: Allergy & Immunology

## 2022-09-28 VITALS — BP 126/72 | HR 63 | Temp 98.2°F | Resp 16 | Ht 63.0 in | Wt 256.6 lb

## 2022-09-28 DIAGNOSIS — J302 Other seasonal allergic rhinitis: Secondary | ICD-10-CM

## 2022-09-28 DIAGNOSIS — J4540 Moderate persistent asthma, uncomplicated: Secondary | ICD-10-CM | POA: Diagnosis not present

## 2022-09-28 DIAGNOSIS — J3089 Other allergic rhinitis: Secondary | ICD-10-CM | POA: Diagnosis not present

## 2022-09-28 DIAGNOSIS — J339 Nasal polyp, unspecified: Secondary | ICD-10-CM | POA: Diagnosis not present

## 2022-09-28 MED ORDER — ALBUTEROL SULFATE HFA 108 (90 BASE) MCG/ACT IN AERS: INHALATION_SPRAY | RESPIRATORY_TRACT | 1 refills | Status: AC

## 2022-09-28 MED ORDER — ADVAIR DISKUS 250-50 MCG/ACT IN AEPB: 1.0000 | INHALATION_SPRAY | Freq: Two times a day (BID) | RESPIRATORY_TRACT | 5 refills | Status: DC

## 2022-09-28 MED ORDER — FLUTICASONE PROPIONATE 50 MCG/ACT NA SUSP: NASAL | 5 refills | Status: DC

## 2022-09-28 NOTE — Patient Instructions (Addendum)
1. Seasonal and perennial allergic rhinitis - Continue with the Flonase two sprays per nostril daily.  - Continue with Zyrtec one tablet daily.  2. Moderate persistent asthma - not well controlled - Lung testing looked stable today.  - We are not going to make any changes. - Daily controller medication(s): Advair 250/50 one puff TWICE daily - Prior to physical activity: albuterol 2 puffs 10-15 minutes before physical activity. - Rescue medications: albuterol 4 puffs every 4-6 hours as needed or albuterol nebulizer one vial every 4-6 hours as needed - Asthma control goals:  * Full participation in all desired activities (may need albuterol before activity) * Albuterol use two time or less a week on average (not counting use with activity) * Cough interfering with sleep two time or less a month * Oral steroids no more than once a year * No hospitalizations  3. Nasal polyposis - Continue with your current management.   4. Return in about 6 months (around 03/30/2023).    Please inform us of any Emergency Department visits, hospitalizations, or changes in symptoms. Call us before going to the ED for breathing or allergy symptoms since we might be able to fit you in for a sick visit. Feel free to contact us anytime with any questions, problems, or concerns.  It was a pleasure to see you again today!  Websites that have reliable patient information: 1. American Academy of Asthma, Allergy, and Immunology: www.aaaai.org 2. Food Allergy Research and Education (FARE): foodallergy.org 3. Mothers of Asthmatics: http://www.asthmacommunitynetwork.org 4. American College of Allergy, Asthma, and Immunology: www.acaai.org   COVID-19 Vaccine Information can be found at: [email or web address] For questions related to vaccine distribution or appointments, please email [email or web address] or call [phone].   We realize that you might be  concerned about having an allergic reaction to the COVID19 vaccines. To help with that concern, WE ARE OFFERING THE COVID19 VACCINES IN OUR OFFICE! Ask the front desk for dates!     "Like" [place] on Facebook and Instagram for our latest updates!      A healthy democracy works best when [employer] participate! Make sure you are registered to vote! If you have moved or changed any of your contact information, you will need to get this updated before voting!  In some cases, you MAY be able to register to vote online: [email or web address]

## 2022-09-28 NOTE — Progress Notes (Signed)
FOLLOW UP  Date of Service/Encounter:  09/28/22   Assessment:   Seasonal and perennial allergic rhinitis (grasses, weeds, trees, indoor and outdoor molds, dust mite, roach)   Moderate persistent asthma with acute exacerbation secondary to influenza   Adverse reaction to Dupixent - unclear causation   Nasal polyposis - s/p three sinus surgeries (1990s, 2013, 2020)   Adverse food reactions (nuts, mushrooms) - with negative testing the past   Pruritus - controlled with emollients alone   Difficulty affording medications - improved with a medication grant   Fully vaccinated to COVID-19  Plan/Recommendations:    Patient Instructions  1. Seasonal and perennial allergic rhinitis - Continue with the Flonase two sprays per nostril daily.  - Continue with Zyrtec one tablet daily.  2. Moderate persistent asthma - not well controlled - Lung testing looked stable today.  - We are not going to make any changes. - Daily controller medication(s): Advair 250/50 one puff TWICE daily - Prior to physical activity: albuterol 2 puffs 10-15 minutes before physical activity. - Rescue medications: albuterol 4 puffs every 4-6 hours as needed or albuterol nebulizer one vial every 4-6 hours as needed - Asthma control goals:  * Full participation in all desired activities (may need albuterol before activity) * Albuterol use two time or less a week on average (not counting use with activity) * Cough interfering with sleep two time or less a month * Oral steroids no more than once a year * No hospitalizations  3. Nasal polyposis - Continue with your current management.   4. Return in about 6 months (around 03/30/2023).    Please inform us of any Emergency Department visits, hospitalizations, or changes in symptoms. Call us before going to the ED for breathing or allergy symptoms since we might be able to fit you in for a sick visit. Feel free to contact us anytime with any questions, problems,  or concerns.  It was a pleasure to see you again today!  Websites that have reliable patient information: 1. American Academy of Asthma, Allergy, and Immunology: www.aaaai.org 2. Food Allergy Research and Education (FARE): foodallergy.org 3. Mothers of Asthmatics: http://www.asthmacommunitynetwork.org 4. American College of Allergy, Asthma, and Immunology: www.acaai.org   COVID-19 Vaccine Information can be found at: [email or web address] For questions related to vaccine distribution or appointments, please email [email or web address] or call [phone].   We realize that you might be concerned about having an allergic reaction to the COVID19 vaccines. To help with that concern, WE ARE OFFERING THE COVID19 VACCINES IN OUR OFFICE! Ask the front desk for dates!     "Like" [place] on Facebook and Instagram for our latest updates!      A healthy democracy works best when [employer] participate! Make sure you are registered to vote! If you have moved or changed any of your contact information, you will need to get this updated before voting!  In some cases, you MAY be able to register to vote online: [email or web address]           Subjective:   Maria Franklin is a 61 y.o. female presenting today for follow up of  Chief Complaint  Patient presents with  . Asthma    6 mth f/u - Good  . Seasonal and Perennial Allergic Rhinits    6 mth f/u - Good    Maria Franklin has a history of the following: Patient Active Problem List   Diagnosis Date Noted  . Morbid obesity ([place]) 07/24/2021  . Paranoid schizophrenia (  [place]) 10/16/2019  . CKD (chronic kidney disease), stage III ([place]) 08/29/2019  . OSA (obstructive sleep apnea) 04/29/2016  . GERD (gastroesophageal reflux disease) 10/28/2015  . Type 2 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 disease, without long-term current use of insulin ([place])  04/19/2014  . 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 Hypertension 01/10/2013  . Hyperlipidemia 01/10/2013  . Hypothyroidism 01/10/2013  . Asthma 03/13/2009    History obtained from: chart review and {Persons; PED relatives w/patient:19415::"patient"}.  Maria Franklin is a 61 y.o. female presenting for {Blank single:19197::"a food challenge","a drug challenge","skin testing","a sick visit","an evaluation of ***","a follow up visit"}.  She was last seen in February 2023.  At that time, we continue with Flonase as well as Claritin.  Her asthma was not well controlled.  Her lung testing looked awful.  We sent in prednisone.  We continued Advair 250 mcg 1 puff once daily as well as albuterol as needed.  For her nasal polyps, she continued to follow with ENT.  Since last visit, she has largely done well.  She was supposed to follow-up 2 months ago but presents 8 months after her last visit.  Asthma/Respiratory Symptom History: She has been doing well since I saw her last time. She is doing one puff twice daily of the Advair. She has not missed doses. She has never been on Spiriva or Trelegy.  She sleeps well at night. She denies any limitations to her physical activity. She is on Disability and has been on this for years. She has not used her emergency inhaler in months or years. She has not had prednisone since I saw her last time. Overall she is doing very well. She is not interested in doing any new medications. She is very hesitant to this. She is getting her medication for free through a "grant" program.   Allergic Rhinitis Symptom History: She is allergic to dogs. Her son brought his dog down. He came to visit from [place] and was only at the house for a couple of days.  She remains on the fluticasone and is now taking cetirizine instead of loratadine. She has not had any sinus infections. Polyps are still clear. She no longer sees ENT.   {Blank single:19197::"Food Allergy Symptom History: ***"," "}  {Blank  single:19197::"Skin Symptom History: ***"," "}  {Blank single:19197::"GERD Symptom History: ***"," "}  Otherwise, there have been no changes to her past medical history, surgical history, family history, or social history.    ROS     Objective:   Blood pressure 126/72, pulse 63, temperature 98.2 F (36.8 C), resp. rate 16, height '5\' 3"'$  (1.6 m), weight 256 lb 9.6 oz (116.4 kg), last menstrual period 02/02/2017, SpO2 100 %. Body mass index is 45.45 kg/m.    Physical Exam   Diagnostic studies:    Spirometry: results abnormal (FEV1: 0.99/48%, FVC: 1.84/71%, FEV1/FVC: 54%).    Spirometry consistent with mixed obstructive and restrictive disease.  Overall, values are slightly better than those obtained at the last visit.  {Blank single:19197::"Albuterol/Atrovent nebulizer","Xopenex/Atrovent nebulizer","Albuterol nebulizer","Albuterol four puffs via MDI","Xopenex four puffs via MDI"} treatment given in clinic with {Blank single:19197::"significant improvement in FEV1 per ATS criteria","significant improvement in FVC per ATS criteria","significant improvement in FEV1 and FVC per ATS criteria","improvement in FEV1, but not significant per ATS criteria","improvement in FVC, but not significant per ATS criteria","improvement in FEV1 and FVC, but not significant per ATS criteria","no improvement"}.  Allergy Studies: {Blank single:19197::"none","labs sent instead"," "}    {Blank single:19197::"Allergy testing results were read and  interpreted by myself, documented by clinical staff."," "}      Salvatore Marvel, MD  [facility] [place] [facility]

## 2022-09-29 ENCOUNTER — Telehealth: Payer: Self-pay | Admitting: *Deleted

## 2022-09-29 NOTE — Telephone Encounter (Signed)
Called and informed patient of lab work that Dr. Ernst Bowler ordered and where she can go to get blood drawn. Patient verbalized understanding.

## 2022-09-29 NOTE — Telephone Encounter (Signed)
-----   Message from Valentina Shaggy, MD sent at 09/29/2022  5:46 AM EDT ----- I decided to order some labs to rule out serious causes of asthma.  Can someone call and let her know that she can get these done in our office or any [facility] location?

## 2022-10-07 ENCOUNTER — Other Ambulatory Visit (HOSPITAL_COMMUNITY): Payer: Self-pay | Admitting: Psychiatry

## 2022-10-07 DIAGNOSIS — F200 Paranoid schizophrenia: Secondary | ICD-10-CM

## 2022-10-07 DIAGNOSIS — F411 Generalized anxiety disorder: Secondary | ICD-10-CM

## 2022-10-12 ENCOUNTER — Ambulatory Visit (HOSPITAL_COMMUNITY): Payer: Medicare HMO | Admitting: Licensed Clinical Social Worker

## 2022-10-17 ENCOUNTER — Other Ambulatory Visit: Payer: Self-pay | Admitting: Internal Medicine

## 2022-10-17 DIAGNOSIS — I10 Essential (primary) hypertension: Secondary | ICD-10-CM

## 2022-10-17 DIAGNOSIS — E039 Hypothyroidism, unspecified: Secondary | ICD-10-CM

## 2022-10-18 NOTE — Telephone Encounter (Signed)
Requested medication (s) are due for refill today: yes  Requested medication (s) are on the active medication list:yes  Last refill:  04/23/22  Future visit scheduled: yes  Notes to clinic:  Unable to refill per protocol, not attached to protocol, routing for review.     Requested Prescriptions  Pending Prescriptions Disp Refills   amLODipine (NORVASC) 10 MG tablet [Pharmacy Med Name: AMLODIPINE BESYLATE 10 MG TAB] 90 tablet 1    Sig: TAKE 1 TABLET BY MOUTH EVERY DAY     There is no refill protocol information for this order     levothyroxine (SYNTHROID) 50 MCG tablet [Pharmacy Med Name: LEVOTHYROXINE 50 MCG TABLET] 90 tablet 1    Sig: TAKE 1 TABLET BY MOUTH EVERY DAY BEFORE BREAKFAST     There is no refill protocol information for this order

## 2022-10-20 ENCOUNTER — Telehealth (HOSPITAL_COMMUNITY): Payer: Medicare HMO | Admitting: Psychiatry

## 2022-11-04 ENCOUNTER — Encounter: Payer: Self-pay | Admitting: Internal Medicine

## 2022-11-04 ENCOUNTER — Ambulatory Visit (INDEPENDENT_AMBULATORY_CARE_PROVIDER_SITE_OTHER): Payer: Medicare HMO | Admitting: Internal Medicine

## 2022-11-04 VITALS — BP 113/61 | HR 70 | Temp 97.1°F | Ht 63.0 in | Wt 250.0 lb

## 2022-11-04 DIAGNOSIS — Z6841 Body Mass Index (BMI) 40.0 and over, adult: Secondary | ICD-10-CM | POA: Diagnosis not present

## 2022-11-04 DIAGNOSIS — E1122 Type 2 diabetes mellitus with diabetic chronic kidney disease: Secondary | ICD-10-CM

## 2022-11-04 DIAGNOSIS — Z0001 Encounter for general adult medical examination with abnormal findings: Secondary | ICD-10-CM | POA: Diagnosis not present

## 2022-11-04 DIAGNOSIS — N1831 Chronic kidney disease, stage 3a: Secondary | ICD-10-CM

## 2022-11-04 DIAGNOSIS — Z23 Encounter for immunization: Secondary | ICD-10-CM | POA: Diagnosis not present

## 2022-11-04 NOTE — Patient Instructions (Signed)
Health Maintenance for Postmenopausal Women Menopause is a normal process in which your ability to get pregnant comes to an end. This process happens slowly over many months or years, usually between the ages of 48 and 55. Menopause is complete when you have missed your menstrual period for 12 months. It is important to talk with your health care provider about some of the most common conditions that affect women after menopause (postmenopausal women). These include heart disease, cancer, and bone loss (osteoporosis). Adopting a healthy lifestyle and getting preventive care can help to promote your health and wellness. The actions you take can also lower your chances of developing some of these common conditions. What are the signs and symptoms of menopause? During menopause, you may have the following symptoms: Hot flashes. These can be moderate or severe. Night sweats. Decrease in sex drive. Mood swings. Headaches. Tiredness (fatigue). Irritability. Memory problems. Problems falling asleep or staying asleep. Talk with your health care provider about treatment options for your symptoms. Do I need hormone replacement therapy? Hormone replacement therapy is effective in treating symptoms that are caused by menopause, such as hot flashes and night sweats. Hormone replacement carries certain risks, especially as you become older. If you are thinking about using estrogen or estrogen with progestin, discuss the benefits and risks with your health care provider. How can I reduce my risk for heart disease and stroke? The risk of heart disease, heart attack, and stroke increases as you age. One of the causes may be a change in the body's hormones during menopause. This can affect how your body uses dietary fats, triglycerides, and cholesterol. Heart attack and stroke are medical emergencies. There are many things that you can do to help prevent heart disease and stroke. Watch your blood pressure High  blood pressure causes heart disease and increases the risk of stroke. This is more likely to develop in people who have high blood pressure readings or are overweight. Have your blood pressure checked: Every 3-5 years if you are 18-39 years of age. Every year if you are 40 years old or older. Eat a healthy diet  Eat a diet that includes plenty of vegetables, fruits, low-fat dairy products, and lean protein. Do not eat a lot of foods that are high in solid fats, added sugars, or sodium. Get regular exercise Get regular exercise. This is one of the most important things you can do for your health. Most adults should: Try to exercise for at least 150 minutes each week. The exercise should increase your heart rate and make you sweat (moderate-intensity exercise). Try to do strengthening exercises at least twice each week. Do these in addition to the moderate-intensity exercise. Spend less time sitting. Even light physical activity can be beneficial. Other tips Work with your health care provider to achieve or maintain a healthy weight. Do not use any products that contain nicotine or tobacco. These products include cigarettes, chewing tobacco, and vaping devices, such as e-cigarettes. If you need help quitting, ask your health care provider. Know your numbers. Ask your health care provider to check your cholesterol and your blood sugar (glucose). Continue to have your blood tested as directed by your health care provider. Do I need screening for cancer? Depending on your health history and family history, you may need to have cancer screenings at different stages of your life. This may include screening for: Breast cancer. Cervical cancer. Lung cancer. Colorectal cancer. What is my risk for osteoporosis? After menopause, you may be   at increased risk for osteoporosis. Osteoporosis is a condition in which bone destruction happens more quickly than new bone creation. To help prevent osteoporosis or  the bone fractures that can happen because of osteoporosis, you may take the following actions: If you are 19-50 years old, get at least 1,000 mg of calcium and at least 600 international units (IU) of vitamin D per day. If you are older than age 50 but younger than age 70, get at least 1,200 mg of calcium and at least 600 international units (IU) of vitamin D per day. If you are older than age 70, get at least 1,200 mg of calcium and at least 800 international units (IU) of vitamin D per day. Smoking and drinking excessive alcohol increase the risk of osteoporosis. Eat foods that are rich in calcium and vitamin D, and do weight-bearing exercises several times each week as directed by your health care provider. How does menopause affect my mental health? Depression may occur at any age, but it is more common as you become older. Common symptoms of depression include: Feeling depressed. Changes in sleep patterns. Changes in appetite or eating patterns. Feeling an overall lack of motivation or enjoyment of activities that you previously enjoyed. Frequent crying spells. Talk with your health care provider if you think that you are experiencing any of these symptoms. General instructions See your health care provider for regular wellness exams and vaccines. This may include: Scheduling regular health, dental, and eye exams. Getting and maintaining your vaccines. These include: Influenza vaccine. Get this vaccine each year before the flu season begins. Pneumonia vaccine. Shingles vaccine. Tetanus, diphtheria, and pertussis (Tdap) booster vaccine. Your health care provider may also recommend other immunizations. Tell your health care provider if you have ever been abused or do not feel safe at home. Summary Menopause is a normal process in which your ability to get pregnant comes to an end. This condition causes hot flashes, night sweats, decreased interest in sex, mood swings, headaches, or lack  of sleep. Treatment for this condition may include hormone replacement therapy. Take actions to keep yourself healthy, including exercising regularly, eating a healthy diet, watching your weight, and checking your blood pressure and blood sugar levels. Get screened for cancer and depression. Make sure that you are up to date with all your vaccines. This information is not intended to replace advice given to you by your health care provider. Make sure you discuss any questions you have with your health care provider. Document Revised: 04/13/2021 Document Reviewed: 04/13/2021 Elsevier Patient Education  2023 [employer]  

## 2022-11-04 NOTE — Progress Notes (Signed)
Subjective:    Patient ID: Maria Franklin, female    DOB: [birth year], 61 y.o.   MRN: [id number]  HPI  Patient presents to clinic today for her annual exam.  Flu: 09/2021 Tetanus: 04/2016 COVID: Pfizer x4 Pneumovax: 03/2019 [facility]rix: [facility] Pap smear: 12/2021 Mammogram: 02/2022 Bone density: 05/2022 Colon screening: 12/2021 Vision screening: annually Dentist: biannually  Diet: She does eat meat. She consumes some fruits and veggies. She does eat some fried foods. She drinks mostly coffee, water. Exercise: None  Review of Systems     Past Medical History:  Diagnosis Date   Allergy    Anemia    Angio-edema    Asthma    ALL THE TIME   LAST FLARE UP 12/2018   Chest pain 08/2019   Chicken pox    Chronic kidney disease    Diabetes mellitus without complication (HCC)    GERD (gastroesophageal reflux disease)    Hyperlipidemia    Hypertension    Hyperthyroidism    Recurrent upper respiratory infection (URI)    Schizophrenia (HCC)    Sleep apnea    DX 2-3 YRS AGO.   Sleep apnea    Thyroid disease    Urticaria     Current Outpatient Medications  Medication Sig Dispense Refill   ADVAIR DISKUS 250-50 MCG/ACT AEPB Inhale 1 puff into the lungs in the morning and at bedtime. 60 each 5   albuterol (PROVENTIL) (2.5 MG/3ML) 0.083% nebulizer solution Take 3 mLs (2.5 mg total) by nebulization every 4 (four) hours as needed for wheezing or shortness of breath. 75 mL 1   albuterol (VENTOLIN HFA) 108 (90 Base) MCG/ACT inhaler INHALE 1 TO 2 PUFFS INTO THE LUNGS EVERY 6 HOURS AS NEEDED WHEEZING/SHORTNESS OF BREATH 18 each 1   amLODipine (NORVASC) 10 MG tablet TAKE 1 TABLET BY MOUTH EVERY DAY 90 tablet 0   aspirin EC 81 MG EC tablet Take 1 tablet (81 mg total) by mouth daily. 30 tablet 0   atorvastatin (LIPITOR) 40 MG tablet TAKE 1 TABLET BY MOUTH EVERY DAY AT 6PM 90 tablet 1   b complex vitamins capsule Take 1 capsule by mouth daily.     Boswellia-Glucosamine-Vit D  (OSTEO BI-FLEX ONE PER DAY PO) Take by mouth.     carvedilol (COREG) 12.5 MG tablet TAKE 1 TABLET (12.5MG TOTAL) BY MOUTH TWICE A DAY WITH MEALS 180 tablet 1   cetirizine (ZYRTEC) 10 MG tablet Take 1 tablet (10 mg total) by mouth daily. 30 tablet 5   Ferrous Sulfate (IRON) 325 (65 Fe) MG TABS Take 1 tablet by mouth every morning.     fluticasone (FLONASE) 50 MCG/ACT nasal spray SPRAY 2 SPRAYS INTO EACH NOSTRIL EVERY DAY 16 mL 5   haloperidol (HALDOL) 5 MG tablet TAKE 0.5 TABLETS (2.5 MG TOTAL) BY MOUTH AT BEDTIME. TAKE 1/2 TABLET AT BED TIME 30 tablet 2   hydrALAZINE (APRESOLINE) 25 MG tablet TAKE 1/2 TABLET BY MOUTH EVERY MORNING AND AT BEDTIME 90 tablet 1   levothyroxine (SYNTHROID) 50 MCG tablet TAKE 1 TABLET BY MOUTH EVERY DAY BEFORE BREAKFAST 90 tablet 0   lisinopril (ZESTRIL) 40 MG tablet Take 1 tablet (40 mg total) by mouth daily. 90 tablet 1   Multiple Vitamins-Minerals (MULTIVITAMIN WITH MINERALS) tablet Take 1 tablet by mouth every evening.     omeprazole (PRILOSEC) 40 MG capsule TAKE 1 CAPSULE BY MOUTH EVERY DAY 90 capsule 1   sertraline (ZOLOFT) 50 MG tablet Take  1 tablet (50 mg total) by mouth daily. 30 tablet 1   No current facility-administered medications for this visit.    Allergies  Allergen Reactions   Bee Pollen Cough   Pollen Extract Cough   Latex Itching, Rash and Other (See Comments)    Severe itching    Family History  Problem Relation Age of Onset   Mental illness Mother    Hypertension Mother    Schizophrenia Mother    Depression Mother    Heart disease Mother    Diabetes Mother    Cancer Father        LUNG   Hypertension Sister    Cancer Sister        BREAST   Breast cancer Sister    Hypertension Sister    Heart attack Sister    Hypertension Sister    Heart attack Sister    Mental illness Brother    Hypertension Brother    Schizophrenia Brother    Depression Brother    Hypertension Brother    Schizophrenia Brother    Hypertension Brother     Breast cancer Maternal Aunt    Schizophrenia Maternal Uncle    Heart disease Maternal Grandmother    Depression Maternal Grandmother    Schizophrenia Maternal Grandmother    Allergic rhinitis Neg Hx    Angioedema Neg Hx    Asthma Neg Hx    Atopy Neg Hx    Eczema Neg Hx    Immunodeficiency Neg Hx    Urticaria Neg Hx    Colon cancer Neg Hx    Colon polyps Neg Hx    Stomach cancer Neg Hx    Rectal cancer Neg Hx     Social History   Socioeconomic History   Marital status: Married    Spouse name: Vangie Bicker   Number of children: 4   Years of education: Associate Degree   Highest education level: Not on file  Occupational History   Occupation: unemployed  Tobacco Use   Smoking status: Former    Packs/day: 0.25    Years: 6.00    Total pack years: 1.50    Types: Cigarettes    Quit date: 1987    Years since quitting: 36.9   Smokeless tobacco: Never   Tobacco comments:    QUIT IN HER EARLY 20'S  Vaping Use   Vaping Use: Never used  Substance and Sexual Activity   Alcohol use: No    Alcohol/week: 0.0 standard drinks of alcohol   Drug use: No   Sexual activity: Not Currently  Other Topics Concern   Not on file  Social History Narrative   Pt is R handed   Lives in single story home with her husband and father-in-law   Has 4 children   Associates Degree x2   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   Social Determinants of Health   Financial Resource Strain: Medium Risk (04/06/2022)   Overall Financial Resource Strain (CARDIA)    Difficulty of Paying Living Expenses: Somewhat hard  Food Insecurity: No Food Insecurity (04/06/2022)   Hunger Vital Sign    Worried About Running Out of Food in the Last Year: Never true    [place] in the Last Year: Never true  Transportation Needs: No Transportation Needs (04/06/2022)   PRAPARE - Hydrologist (Medical): No    Lack of Transportation  (Non-Medical): No  Physical Activity: Inactive (  07/28/2020)   Exercise Vital Sign    Days of Exercise per Week: 0 days    Minutes of Exercise per Session: 0 min  Stress: Stress Concern Present (04/06/2022)   [place]    Feeling of Stress : To some extent  Social Connections: Moderately Isolated (04/06/2022)   Social Connection and Isolation Panel [NHANES]    Frequency of Communication with Friends and Family: Once a week    Frequency of Social Gatherings with Friends and Family: Never    Attends Religious Services: More than 4 times per year    Active Member of [employer] or Organizations: Yes    Attends Archivist Meetings: More than 4 times per year    Marital Status: Widowed  Intimate Partner Violence: Not At Risk (07/28/2020)   Humiliation, Afraid, Rape, and Kick questionnaire    Fear of Current or Ex-Partner: No    Emotionally Abused: No    Physically Abused: No    Sexually Abused: No     Constitutional: Denies fever, malaise, fatigue, headache or abrupt weight changes.  HEENT: Denies eye pain, eye redness, ear pain, ringing in the ears, wax buildup, runny nose, nasal congestion, bloody nose, or sore throat. Respiratory: Denies difficulty breathing, shortness of breath, cough or sputum production.   Cardiovascular: Denies chest pain, chest tightness, palpitations or swelling in the hands or feet.  Gastrointestinal: Patient reports intermittent diarrhea.  Denies abdominal pain, bloating, constipation, or blood in the stool.  GU: Denies urgency, frequency, pain with urination, burning sensation, blood in urine, odor or discharge. Musculoskeletal: Denies decrease in range of motion, difficulty with gait, muscle pain or joint pain and swelling.  Skin: Denies redness, rashes, lesions or ulcercations.  Neurological: Denies dizziness, difficulty with memory, difficulty with speech or problems with balance and  coordination.  Psych: Patient reports anxiety.  Denies depression, SI/HI.  No other specific complaints in a complete review of systems (except as listed in HPI above).  Objective:   Physical Exam BP 113/61 (BP Location: Right Arm, Patient Position: Sitting, Cuff Size: Large)   Pulse 70   Temp (!) 97.1 F (36.2 C) (Temporal)   Ht _0  (1.6 m)   Wt 250 lb (113.4 kg)   LMP 02/02/2017 (Approximate)   SpO2 97%   BMI 44.29 kg/m   Wt Readings from Last 3 Encounters:  09/28/22 256 lb 9.6 oz (116.4 kg)  05/04/22 259 lb (117.5 kg)  01/28/22 261 lb (118.4 kg)    General: Appears her stated age, obese, in NAD. Skin: Warm, dry and intact. No ulcerations noted. HEENT: Head: normal shape and size; Eyes: sclera white, no icterus, conjunctiva pink, PERRLA and EOMs intact;  Neck:  Neck supple, trachea midline. No masses, lumps or thyromegaly present.  Cardiovascular: Normal rate and rhythm. S1,S2 noted.  No murmur, rubs or gallops noted. No JVD or BLE edema. No carotid bruits noted. Pulmonary/Chest: Normal effort and positive vesicular breath sounds. No respiratory distress. No wheezes, rales or ronchi noted.  Abdomen: Normal bowel sounds.  Musculoskeletal: Strength 5/5 BUE/BLE.  No difficulty with gait.  Neurological: Alert and oriented. Cranial nerves II-XII grossly intact. Coordination normal.  Psychiatric: Mood and affect normal. Behavior is normal. Judgment and thought content normal.     BMET    Component Value Date/Time   NA 140 05/04/2022 0950   K 4.6 05/04/2022 0950   CL 103 05/04/2022 0950   CO2 27 05/04/2022 0950   GLUCOSE 106 (  H) 05/04/2022 0950   BUN 15 05/04/2022 0950   CREATININE 1.35 (H) 05/04/2022 0950   CALCIUM 9.8 05/04/2022 0950   GFRNONAA 44 (L) 11/18/2020 0939   GFRNONAA 53 (L) 07/28/2020 0942   GFRAA 61 07/28/2020 0942    Lipid Panel     Component Value Date/Time   CHOL 161 05/04/2022 0950   TRIG 114 05/04/2022 0950   HDL 73 05/04/2022 0950   CHOLHDL  2.2 05/04/2022 0950   VLDL 18 10/17/2019 0630   LDLCALC 68 05/04/2022 0950    CBC    Component Value Date/Time   WBC 7.2 05/04/2022 0950   RBC 4.20 05/04/2022 0950   HGB 11.8 05/04/2022 0950   HCT 36.8 05/04/2022 0950   PLT 251 05/04/2022 0950   MCV 87.6 05/04/2022 0950   MCH 28.1 05/04/2022 0950   MCHC 32.1 05/04/2022 0950   RDW 13.0 05/04/2022 0950   LYMPHSABS 2.6 11/18/2020 0939   MONOABS 0.5 11/18/2020 0939   EOSABS 0.4 11/18/2020 0939   BASOSABS 0.0 11/18/2020 0939    Hgb A1C Lab Results  Component Value Date   HGBA1C 6.2 (H) 05/04/2022            Assessment & Plan:   Preventative Health Maintenance:  Flu shot today Tetanus UTD Encouraged her to get her COVID booster Pneumovax UTD [facility] a [facility] Pap smear UTD Mammogram UTD Bone density UTD Colon screening UTD Encouraged her to consume a balanced diet and exercise regimen Advised her to see an eye doctor and dentist annually We will check CBC, c-Met, lipid, A1c today     RTC in 6 months, follow-up chronic conditions Webb Silversmith, NP

## 2022-11-04 NOTE — Assessment & Plan Note (Signed)
Encourage diet and exercise for weight loss 

## 2022-11-05 ENCOUNTER — Other Ambulatory Visit: Payer: Self-pay | Admitting: Internal Medicine

## 2022-11-05 DIAGNOSIS — I10 Essential (primary) hypertension: Secondary | ICD-10-CM

## 2022-11-05 DIAGNOSIS — E782 Mixed hyperlipidemia: Secondary | ICD-10-CM

## 2022-11-05 LAB — COMPLETE METABOLIC PANEL WITH GFR
AG Ratio: 1.5 (calc) (ref 1.0–2.5)
ALT: 20 U/L (ref 6–29)
AST: 22 U/L (ref 10–35)
Albumin: 4.2 g/dL (ref 3.6–5.1)
Alkaline phosphatase (APISO): 91 U/L (ref 37–153)
BUN/Creatinine Ratio: 14 (calc) (ref 6–22)
BUN: 16 mg/dL (ref 7–25)
CO2: 23 mmol/L (ref 20–32)
Calcium: 9.4 mg/dL (ref 8.6–10.4)
Chloride: 108 mmol/L (ref 98–110)
Creat: 1.17 mg/dL — ABNORMAL HIGH (ref 0.50–1.05)
Globulin: 2.8 g/dL (calc) (ref 1.9–3.7)
Glucose, Bld: 107 mg/dL — ABNORMAL HIGH (ref 65–99)
Potassium: 4.3 mmol/L (ref 3.5–5.3)
Sodium: 141 mmol/L (ref 135–146)
Total Bilirubin: 0.4 mg/dL (ref 0.2–1.2)
Total Protein: 7 g/dL (ref 6.1–8.1)
eGFR: 53 mL/min/{1.73_m2} — ABNORMAL LOW (ref >=60)

## 2022-11-05 LAB — HEMOGLOBIN A1C
Hgb A1c MFr Bld: 6.2 % of total Hgb — ABNORMAL HIGH (ref <5.7)
Mean Plasma Glucose: 131 mg/dL
eAG (mmol/L): 7.3 mmol/L

## 2022-11-05 LAB — CBC
HCT: 35 % (ref 35.0–45.0)
Hemoglobin: 11.4 g/dL — ABNORMAL LOW (ref 11.7–15.5)
MCH: 28 pg (ref 27.0–33.0)
MCHC: 32.6 g/dL (ref 32.0–36.0)
MCV: 86 fL (ref 80.0–100.0)
MPV: 9.6 fL (ref 7.5–12.5)
Platelets: 259 10*3/uL (ref 140–400)
RBC: 4.07 10*6/uL (ref 3.80–5.10)
RDW: 12.7 % (ref 11.0–15.0)
WBC: 6.7 10*3/uL (ref 3.8–10.8)

## 2022-11-05 LAB — LIPID PANEL
Cholesterol: 160 mg/dL (ref <200)
HDL: 71 mg/dL (ref >=50)
LDL Cholesterol (Calc): 72 mg/dL (calc)
Non-HDL Cholesterol (Calc): 89 mg/dL (calc) (ref <130)
Total CHOL/HDL Ratio: 2.3 (calc) (ref <5.0)
Triglycerides: 85 mg/dL (ref <150)

## 2022-11-09 ENCOUNTER — Other Ambulatory Visit (HOSPITAL_COMMUNITY): Payer: Self-pay | Admitting: Psychiatry

## 2022-11-09 DIAGNOSIS — F200 Paranoid schizophrenia: Secondary | ICD-10-CM

## 2022-11-09 DIAGNOSIS — F411 Generalized anxiety disorder: Secondary | ICD-10-CM

## 2022-11-15 ENCOUNTER — Encounter (HOSPITAL_COMMUNITY): Payer: Self-pay | Admitting: Psychiatry

## 2022-11-15 ENCOUNTER — Telehealth (HOSPITAL_BASED_OUTPATIENT_CLINIC_OR_DEPARTMENT_OTHER): Payer: Medicare HMO | Admitting: Psychiatry

## 2022-11-15 VITALS — Wt 246.0 lb

## 2022-11-15 DIAGNOSIS — F200 Paranoid schizophrenia: Secondary | ICD-10-CM | POA: Diagnosis not present

## 2022-11-15 DIAGNOSIS — F411 Generalized anxiety disorder: Secondary | ICD-10-CM

## 2022-11-15 DIAGNOSIS — R69 Illness, unspecified: Secondary | ICD-10-CM | POA: Diagnosis not present

## 2022-11-15 MED ORDER — SERTRALINE HCL 50 MG PO TABS: 50.0000 mg | ORAL_TABLET | Freq: Every day | ORAL | 2 refills | Status: DC

## 2022-11-15 MED ORDER — HALOPERIDOL 5 MG PO TABS: ORAL_TABLET | ORAL | 2 refills | Status: DC

## 2022-11-15 NOTE — Progress Notes (Signed)
Virtual Visit via Telephone Note  I connected with Maria Franklin on 11/15/22 at  3:00 PM EST by telephone and verified that I am speaking with the correct person using two identifiers.  Location: Patient: Home Provider: Home Office   I discussed the limitations, risks, security and privacy concerns of performing an evaluation and management service by telephone and the availability of in person appointments. I also discussed with the patient that there may be a patient responsible charge related to this service. The patient expressed understanding and agreed to proceed.   History of Present Illness: Patient is evaluated by phone session.  On the last visit we started on Zoloft.  She really like the Zoloft as she is feeling less anxious and less depressed.  However she started to have GI side effects.  She lost 3 pounds since the last visit.  She does not want to try a different medication because she noticed it helps her anxiety and she does not have racing thoughts and feeling of hopelessness.  Her husband is now working and she is relieved.  She is sad because her children did not contact on Thanksgiving.  She had a good support from her husband.  She admitted financial stress and does not afford therapy.  She like to continue Haldol 2.5 mg and Zoloft 50 mg daily.   Psychiatric History: Reviewed. H/O paranoia and delusions. H/O overdose and inpatient at [facility]. H/O overdose and inpatient at [facility]. Readmitted in 4 months due to noncompliant with medication. Last admission at [facility] in Nov 2020. Abilify worked but could not afford. No h/o abuse.    Psychiatric Specialty Exam: Physical Exam  Review of Systems  Weight 246 lb (111.6 kg), last menstrual period 02/02/2017.There is no height or weight on file to calculate BMI.  General Appearance: NA  Eye Contact:  NA  Speech:  Slow  Volume:  Decreased  Mood:  Dysphoric  Affect:  NA  Thought Process:  Goal  Directed  Orientation:  Full (Time, Place, and Person)  Thought Content:  Rumination  Suicidal Thoughts:  No  Homicidal Thoughts:  No  Memory:  Immediate;   Fair Recent;   Fair Remote;   Fair  Judgement:  Fair  Insight:  Shallow  Psychomotor Activity:  NA  Concentration:  Concentration: Fair and Attention Span: Fair  Recall:  Good  Fund of Knowledge:  Good  Language:  Good  Akathisia:  No  Handed:  Right  AIMS (if indicated):     Assets:  Communication Skills Desire for Improvement Housing  ADL's:  Intact  Cognition:  WNL  Sleep:   too much      Assessment and Plan: Schizophrenia chronic paranoid type.  Generalized anxiety disorder.  Patient liked the Zoloft but reported GI side effects.  She is hoping it will subsided because she does not want to stop the Zoloft or try a different medication.  She also reported her paranoia is also under control.  I encouraged if side effects of the Zoloft did not improved then she should call us back.  She agreed with the plan.  We will continue Zoloft 50 mg daily and Haldol 2.5 mg at bedtime.  She is not interested in therapy because she cannot afford.  I recommend to call us back if there is any question or any concern.  Follow-up in 3 months.  Follow Up Instructions:    I discussed the assessment and treatment plan with the patient.  The patient was provided an opportunity to ask questions and all were answered. The patient agreed with the plan and demonstrated an understanding of the instructions.   The patient was advised to call back or seek an in-person evaluation if the symptoms worsen or if the condition fails to improve as anticipated.  Collaboration of Care: Other provider involved in patient's care AEB notes are available in epic to review.  Patient/Guardian was advised Release of Information must be obtained prior to any record release in order to collaborate their care with an outside provider. Patient/Guardian was advised if  they have not already done so to contact the registration department to sign all necessary forms in order for [place] to release information regarding their care.   Consent: Patient/Guardian gives verbal consent for treatment and assignment of benefits for services provided during this visit. Patient/Guardian expressed understanding and agreed to proceed.    I provided 12 minutes of non-face-to-face time during this encounter.   Kathlee Nations, MD

## 2022-12-09 ENCOUNTER — Other Ambulatory Visit: Payer: Self-pay | Admitting: Internal Medicine

## 2022-12-09 DIAGNOSIS — I10 Essential (primary) hypertension: Secondary | ICD-10-CM

## 2022-12-09 NOTE — Telephone Encounter (Signed)
Requested Prescriptions  Pending Prescriptions Disp Refills   carvedilol (COREG) 12.5 MG tablet [Pharmacy Med Name: CARVEDILOL 12.5 MG TABLET] 180 tablet 1    Sig: TAKE 1 TABLET BY MOUTH TWICE A DAY WITH MEALS     Cardiovascular: Beta Blockers 3 Failed - 12/09/2022  1:42 AM      Failed - Cr in normal range and within 360 days    Creat  Date Value Ref Range Status  11/04/2022 1.17 (H) 0.50 - 1.05 mg/dL Final   Creatinine,U  Date Value Ref Range Status  07/25/2019 153.6 mg/dL Final   Creatinine, Urine  Date Value Ref Range Status  05/04/2022 185 20 - 275 mg/dL Final         Passed - AST in normal range and within 360 days    AST  Date Value Ref Range Status  11/04/2022 22 10 - 35 U/L Final         Passed - ALT in normal range and within 360 days    ALT  Date Value Ref Range Status  11/04/2022 20 6 - 29 U/L Final         Passed - Last BP in normal range    BP Readings from Last 1 Encounters:  11/04/22 113/61         Passed - Last Heart Rate in normal range    Pulse Readings from Last 1 Encounters:  11/04/22 70         Passed - Valid encounter within last 6 months    Recent Outpatient Visits           1 month ago Encounter for general adult medical examination with abnormal findings   [facility] [place], Coralie Keens, NP   7 months ago Type 2 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 disease, without long-term current use of insulin ([place])   [facility] [place], Coralie Keens, NP   11 months ago Postmenopausal vaginal bleeding   [facility] [place], [place] W, NP   1 year ago Encounter for general adult medical examination with abnormal findings   [facility] [place], Coralie Keens, NP   1 year ago Excessive daytime sleepiness   [facility] [place], Coralie Keens, NP       Future Appointments             In 3 months Ernst Bowler, Gwenith Daily, MD [facility] [place]              hydrALAZINE (APRESOLINE) 25 MG tablet [Pharmacy Med Name: HYDRALAZINE 25 MG TABLET] 90 tablet     Sig: TAKE 1/2 TABLET BY MOUTH EVERY MORNING AND AT BEDTIME     Cardiovascular:  Vasodilators Failed - 12/09/2022  1:42 AM      Failed - HGB in normal range and within 360 days    Hemoglobin  Date Value Ref Range Status  11/04/2022 11.4 (L) 11.7 - 15.5 g/dL Final         Failed - ANA Screen, Ifa, Serum in normal range and within 360 days    Anti Nuclear Antibody (ANA)  Date Value Ref Range Status  07/22/2021 NEGATIVE NEGATIVE Final    Comment:    ANA IFA is a first line screen for detecting the presence of up to approximately 150 autoantibodies in various autoimmune diseases. A negative ANA IFA result suggests an ANA-associated autoimmune disease is not present at this time, but is not definitive. If  there is high clinical suspicion for Sjogren's syndrome, testing for anti-SS-A/Ro antibody should be considered. Anti-Jo-1 antibody should be considered for clinically suspected inflammatory myopathies. . AC-0: Negative . International Consensus on ANA Patterns (https://www.hernandez-brewer.com/) . For additional information, please refer to http://education.[employer].com/faq/FAQ177 (This link is being provided for informational/ educational purposes only.) .          Passed - HCT in normal range and within 360 days    HCT  Date Value Ref Range Status  11/04/2022 35.0 35.0 - 45.0 % Final         Passed - RBC in normal range and within 360 days    RBC  Date Value Ref Range Status  11/04/2022 4.07 3.80 - 5.10 Million/uL Final         Passed - WBC in normal range and within 360 days    WBC  Date Value Ref Range Status  11/04/2022 6.7 3.8 - 10.8 Thousand/uL Final         Passed - PLT in normal range and within 360 days    Platelets  Date Value Ref Range Status  11/04/2022 259 140 - 400 Thousand/uL Final         Passed - Last BP in normal range    BP  Readings from Last 1 Encounters:  11/04/22 113/61         Passed - Valid encounter within last 12 months    Recent Outpatient Visits           1 month ago Encounter for general adult medical examination with abnormal findings   [facility] [place], Coralie Keens, NP   7 months ago Type 2 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 disease, without long-term current use of insulin [facility])   [facility] [place], Coralie Keens, NP   11 months ago Postmenopausal vaginal bleeding   [place], Coralie Keens, NP   1 year ago Encounter for general adult medical examination with abnormal findings   [facility] [place], Coralie Keens, NP   1 year ago Excessive daytime sleepiness   [facility] [place], Coralie Keens, NP       Future Appointments             In 3 months Ernst Bowler, Gwenith Daily, MD [facility] [place]

## 2022-12-09 NOTE — Telephone Encounter (Signed)
Requested medication (s) are due for refill today: yes  Requested medication (s) are on the active medication list: yes  Last refill:  06/11/22 #90 1 RF  Future visit scheduled: no  Notes to clinic:  overdue lab work   Requested Prescriptions  Pending Prescriptions Disp Refills   hydrALAZINE (APRESOLINE) 25 MG tablet [Pharmacy Med Name: HYDRALAZINE 25 MG TABLET] 90 tablet     Sig: TAKE 1/2 TABLET BY MOUTH EVERY MORNING AND AT BEDTIME     Cardiovascular:  Vasodilators Failed - 12/09/2022  1:42 AM      Failed - HGB in normal range and within 360 days    Hemoglobin  Date Value Ref Range Status  11/04/2022 11.4 (L) 11.7 - 15.5 g/dL Final         Failed - ANA Screen, Ifa, Serum in normal range and within 360 days    Anti Nuclear Antibody (ANA)  Date Value Ref Range Status  07/22/2021 NEGATIVE NEGATIVE Final    Comment:    ANA IFA is a first line screen for detecting the presence of up to approximately 150 autoantibodies in various autoimmune diseases. A negative ANA IFA result suggests an ANA-associated autoimmune disease is not present at this time, but is not definitive. If there is high clinical suspicion for Sjogren's syndrome, testing for anti-SS-A/Ro antibody should be considered. Anti-Jo-1 antibody should be considered for clinically suspected inflammatory myopathies. . AC-0: Negative . International Consensus on ANA Patterns (https://www.hernandez-brewer.com/) . For additional information, please refer to http://education.[employer].com/faq/FAQ177 (This link is being provided for informational/ educational purposes only.) .          Passed - HCT in normal range and within 360 days    HCT  Date Value Ref Range Status  11/04/2022 35.0 35.0 - 45.0 % Final         Passed - RBC in normal range and within 360 days    RBC  Date Value Ref Range Status  11/04/2022 4.07 3.80 - 5.10 Million/uL Final         Passed - WBC in normal range and within 360  days    WBC  Date Value Ref Range Status  11/04/2022 6.7 3.8 - 10.8 Thousand/uL Final         Passed - PLT in normal range and within 360 days    Platelets  Date Value Ref Range Status  11/04/2022 259 140 - 400 Thousand/uL Final         Passed - Last BP in normal range    BP Readings from Last 1 Encounters:  11/04/22 113/61         Passed - Valid encounter within last 12 months    Recent Outpatient Visits           1 month ago Encounter for general adult medical examination with abnormal findings   [facility] [place], Coralie Keens, NP   7 months ago Type 2 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 disease, without long-term current use of insulin [facility])   [facility] [place], Coralie Keens, NP   11 months ago Postmenopausal vaginal bleeding   [place], Coralie Keens, NP   1 year ago Encounter for general adult medical examination with abnormal findings   [facility] [place], Coralie Keens, NP   1 year ago Excessive daytime sleepiness   [facility] [place], Coralie Keens, NP       Future Appointments  In 3 months Valentina Shaggy, MD [facility]            Signed Prescriptions Disp Refills   carvedilol (COREG) 12.5 MG tablet 180 tablet 1    Sig: TAKE 1 TABLET BY MOUTH TWICE A DAY WITH MEALS     Cardiovascular: Beta Blockers 3 Failed - 12/09/2022  1:42 AM      Failed - Cr in normal range and within 360 days    Creat  Date Value Ref Range Status  11/04/2022 1.17 (H) 0.50 - 1.05 mg/dL Final   Creatinine,U  Date Value Ref Range Status  07/25/2019 153.6 mg/dL Final   Creatinine, Urine  Date Value Ref Range Status  05/04/2022 185 20 - 275 mg/dL Final         Passed - AST in normal range and within 360 days    AST  Date Value Ref Range Status  11/04/2022 22 10 - 35 U/L Final         Passed - ALT in normal range and within 360 days    ALT  Date Value Ref  Range Status  11/04/2022 20 6 - 29 U/L Final         Passed - Last BP in normal range    BP Readings from Last 1 Encounters:  11/04/22 113/61         Passed - Last Heart Rate in normal range    Pulse Readings from Last 1 Encounters:  11/04/22 70         Passed - Valid encounter within last 6 months    Recent Outpatient Visits           1 month ago Encounter for general adult medical examination with abnormal findings   [facility] [place], Coralie Keens, NP   7 months ago Type 2 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 disease, without long-term current use of insulin ([place])   [facility] [place], Coralie Keens, NP   11 months ago Postmenopausal vaginal bleeding   [place], Coralie Keens, NP   1 year ago Encounter for general adult medical examination with abnormal findings   [facility] [place], Coralie Keens, NP   1 year ago Excessive daytime sleepiness   [facility] [place], Coralie Keens, NP       Future Appointments             In 3 months Ernst Bowler, Gwenith Daily, MD [facility] [place]

## 2022-12-29 ENCOUNTER — Other Ambulatory Visit: Payer: Self-pay | Admitting: Internal Medicine

## 2022-12-29 DIAGNOSIS — Z1231 Encounter for screening mammogram for malignant neoplasm of breast: Secondary | ICD-10-CM

## 2023-01-11 ENCOUNTER — Other Ambulatory Visit: Payer: Self-pay | Admitting: Internal Medicine

## 2023-01-11 DIAGNOSIS — I10 Essential (primary) hypertension: Secondary | ICD-10-CM

## 2023-01-12 NOTE — Telephone Encounter (Signed)
Requested medication (s) are due for refill today: yes  Requested medication (s) are on the active medication list: yes  Last refill:  10/18/22 #90 0 refills  Future visit scheduled: no   Notes to clinic:  no refills remain. Do you want to refill Rx?     Requested Prescriptions  Pending Prescriptions Disp Refills   amLODipine (NORVASC) 10 MG tablet [Pharmacy Med Name: AMLODIPINE BESYLATE 10 MG TAB] 90 tablet 0    Sig: TAKE 1 TABLET BY MOUTH EVERY DAY     Cardiovascular: Calcium Channel Blockers 2 Passed - 01/12/2023 11:46 AM      Passed - Last BP in normal range    BP Readings from Last 1 Encounters:  11/04/22 113/61         Passed - Last Heart Rate in normal range    Pulse Readings from Last 1 Encounters:  11/04/22 70         Passed - Valid encounter within last 6 months    Recent Outpatient Visits           2 months ago Encounter for general adult medical examination with abnormal findings   [facility] [place], [place] W, NP   8 months ago Type 2 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 disease, without long-term current use of insulin [facility])   [facility] [place], Coralie Keens, NP   1 year ago Postmenopausal vaginal bleeding   [facility] [place], Coralie Keens, NP   1 year ago Encounter for general adult medical examination with abnormal findings   [facility] [place], Coralie Keens, NP   1 year ago Excessive daytime sleepiness   [facility] [place], Coralie Keens, NP       Future Appointments             In 2 months Ernst Bowler, Gwenith Daily, MD [place] [facility] [place] [facility]

## 2023-02-08 ENCOUNTER — Other Ambulatory Visit (HOSPITAL_COMMUNITY): Payer: Self-pay | Admitting: Psychiatry

## 2023-02-08 DIAGNOSIS — F200 Paranoid schizophrenia: Secondary | ICD-10-CM

## 2023-02-08 DIAGNOSIS — F411 Generalized anxiety disorder: Secondary | ICD-10-CM

## 2023-02-14 ENCOUNTER — Telehealth (HOSPITAL_BASED_OUTPATIENT_CLINIC_OR_DEPARTMENT_OTHER): Payer: Medicare HMO | Admitting: Psychiatry

## 2023-02-14 ENCOUNTER — Encounter (HOSPITAL_COMMUNITY): Payer: Self-pay | Admitting: Psychiatry

## 2023-02-14 DIAGNOSIS — Z135 Encounter for screening for eye and ear disorders: Secondary | ICD-10-CM | POA: Diagnosis not present

## 2023-02-14 DIAGNOSIS — H524 Presbyopia: Secondary | ICD-10-CM | POA: Diagnosis not present

## 2023-02-14 DIAGNOSIS — F411 Generalized anxiety disorder: Secondary | ICD-10-CM

## 2023-02-14 DIAGNOSIS — F200 Paranoid schizophrenia: Secondary | ICD-10-CM | POA: Diagnosis not present

## 2023-02-14 DIAGNOSIS — H52223 Regular astigmatism, bilateral: Secondary | ICD-10-CM | POA: Diagnosis not present

## 2023-02-14 DIAGNOSIS — H5213 Myopia, bilateral: Secondary | ICD-10-CM | POA: Diagnosis not present

## 2023-02-14 DIAGNOSIS — E119 Type 2 diabetes mellitus without complications: Secondary | ICD-10-CM | POA: Diagnosis not present

## 2023-02-14 MED ORDER — SERTRALINE HCL 50 MG PO TABS: 50.0000 mg | ORAL_TABLET | Freq: Every day | ORAL | 2 refills | Status: DC

## 2023-02-14 MED ORDER — HALOPERIDOL 5 MG PO TABS: ORAL_TABLET | ORAL | 2 refills | Status: DC

## 2023-02-14 NOTE — Progress Notes (Signed)
[facility] MD Virtual Progress Note   Patient Location: Home Provider Location: Home  I connect with patient by video and verified that I am speaking with correct person by using two identifiers. I discussed the limitations of evaluation and management by telemedicine and the availability of in person appointments. I also discussed with the patient that there may be a patient responsible charge related to this service. The patient expressed understanding and agreed to proceed.  Maria Franklin [id number] 62 y.o.  02/14/2023 2:10 PM  History of Present Illness:  Patient is evaluated by video session.  She is taking Zoloft which is helping her anxiety and she does not have any more GI side effects.  Her energy level is good.  She sleeps all night.  She denies any paranoia, hallucination or any delusions.  She does go outside with her husband and does not feel as paranoid.  Her husband is very supportive.  Patient told Christmas was quiet because her 3 children did not come.  Patient told one of their son is living with them with the grandchild.  Patient denies any tremors, shakes or any EPS.  She denies any feeling of hopelessness or worthlessness.  She like to keep the Haldol and Zoloft.  Her appetite is okay.  Her weight is stable.  Past Psychiatric History: H/O paranoia and delusions. H/O overdose and inpatient at [facility]. H/O overdose and inpatient at [facility]. Readmitted in 4 months due to noncompliant with medication. Last admission [facility] 2020. Abilify worked but could not afford. No h/o abuse.      Outpatient Encounter Medications as of 02/14/2023  Medication Sig   ADVAIR DISKUS 250-50 MCG/ACT AEPB Inhale 1 puff into the lungs in the morning and at bedtime.   albuterol (PROVENTIL) (2.5 MG/3ML) 0.083% nebulizer solution Take 3 mLs (2.5 mg total) by nebulization every 4 (four) hours as needed for wheezing or shortness of breath.    albuterol (VENTOLIN HFA) 108 (90 Base) MCG/ACT inhaler INHALE 1 TO 2 PUFFS INTO THE LUNGS EVERY 6 HOURS AS NEEDED WHEEZING/SHORTNESS OF BREATH   amLODipine (NORVASC) 10 MG tablet TAKE 1 TABLET BY MOUTH EVERY DAY   aspirin EC 81 MG EC tablet Take 1 tablet (81 mg total) by mouth daily.   atorvastatin (LIPITOR) 40 MG tablet TAKE 1 TABLET BY MOUTH EVERY DAY AT 6PM   b complex vitamins capsule Take 1 capsule by mouth daily.   Boswellia-Glucosamine-Vit D (OSTEO BI-FLEX ONE PER DAY PO) Take by mouth.   carvedilol (COREG) 12.5 MG tablet TAKE 1 TABLET BY MOUTH TWICE A DAY WITH MEALS   cetirizine (ZYRTEC) 10 MG tablet Take 1 tablet (10 mg total) by mouth daily.   Ferrous Sulfate (IRON) 325 (65 Fe) MG TABS Take 1 tablet by mouth every morning.   fluticasone (FLONASE) 50 MCG/ACT nasal spray SPRAY 2 SPRAYS INTO EACH NOSTRIL EVERY DAY   haloperidol (HALDOL) 5 MG tablet TAKE 0.5 TABLETS (2.5 MG TOTAL) BY MOUTH AT BEDTIME. TAKE 1/2 TABLET AT BED TIME   hydrALAZINE (APRESOLINE) 25 MG tablet TAKE 1/2 TABLET BY MOUTH EVERY MORNING AND AT BEDTIME   levothyroxine (SYNTHROID) 50 MCG tablet TAKE 1 TABLET BY MOUTH EVERY DAY BEFORE BREAKFAST   lisinopril (ZESTRIL) 40 MG tablet TAKE 1 TABLET BY MOUTH EVERY DAY   Multiple Vitamins-Minerals (MULTIVITAMIN WITH MINERALS) tablet Take 1 tablet by mouth every evening.   omeprazole (PRILOSEC) 40 MG capsule TAKE 1 CAPSULE BY MOUTH EVERY  DAY   sertraline (ZOLOFT) 50 MG tablet Take 1 tablet (50 mg total) by mouth daily.   No facility-administered encounter medications on file as of 02/14/2023.    No results found for this or any previous visit (from the past 2160 hour(s)).   Psychiatric Specialty Exam: Physical Exam  Review of Systems  Weight 255 lb (115.7 kg), last menstrual period 02/02/2017.There is no height or weight on file to calculate BMI.  General Appearance: Casual  Eye Contact:  Good  Speech:  Clear and Coherent and Normal Rate  Volume:  Normal  Mood:  Euthymic   Affect:  Congruent  Thought Process:  Goal Directed  Orientation:  Full (Time, Place, and Person)  Thought Content:  Logical  Suicidal Thoughts:  No  Homicidal Thoughts:  No  Memory:  Immediate;   Good Recent;   Good Remote;   Good  Judgement:  Good  Insight:  Good  Psychomotor Activity:  Normal  Concentration:  Concentration: Good and Attention Span: Fair  Recall:  Good  Fund of Knowledge:  Good  Language:  Good  Akathisia:  No  Handed:  Right  AIMS (if indicated):     Assets:  Communication Skills Desire for Improvement Housing Social Support Transportation  ADL's:  Intact  Cognition:  WNL  Sleep:  ok     Assessment/Plan: Paranoid schizophrenia, chronic condition (HCC) - Plan: haloperidol (HALDOL) 5 MG tablet, sertraline (ZOLOFT) 50 MG tablet  GAD (generalized anxiety disorder) - Plan: sertraline (ZOLOFT) 50 MG tablet  Patient is stable on her current medication.  Continue Haldol 2.5 mg at bedtime and Zoloft 50 mg daily.  She is not interested in therapy.  She has no more GI side effects.  Recommend to call us back if she has any question or any concern.  Follow-up in 3 months.   Follow Up Instructions:     I discussed the assessment and treatment plan with the patient. The patient was provided an opportunity to ask questions and all were answered. The patient agreed with the plan and demonstrated an understanding of the instructions.   The patient was advised to call back or seek an in-person evaluation if the symptoms worsen or if the condition fails to improve as anticipated.    Collaboration of Care: Other provider involved in patient's care AEB notes are available in epic to review.  Patient/Guardian was advised Release of Information must be obtained prior to any record release in order to collaborate their care with an outside provider. Patient/Guardian was advised if they have not already done so to contact the registration department to sign all necessary  forms in order for [place] to release information regarding their care.   Consent: Patient/Guardian gives verbal consent for treatment and assignment of benefits for services provided during this visit. Patient/Guardian expressed understanding and agreed to proceed.     I provided 20 minutes of non face to face time during this encounter.  Kathlee Nations, MD 02/14/2023

## 2023-02-16 ENCOUNTER — Ambulatory Visit
Admission: RE | Admit: 2023-02-16 | Discharge: 2023-02-16 | Disposition: A | Discharge status: Home / Self Care | Payer: Medicare HMO | Source: Ambulatory Visit | Attending: Internal Medicine | Admitting: Internal Medicine

## 2023-02-16 DIAGNOSIS — Z1231 Encounter for screening mammogram for malignant neoplasm of breast: Secondary | ICD-10-CM

## 2023-03-12 ENCOUNTER — Other Ambulatory Visit: Payer: Self-pay | Admitting: Internal Medicine

## 2023-03-14 NOTE — Telephone Encounter (Signed)
Requested Prescriptions  Pending Prescriptions Disp Refills   omeprazole (PRILOSEC) 40 MG capsule [Pharmacy Med Name: OMEPRAZOLE DR 40 MG CAPSULE] 90 capsule 0    Sig: TAKE 1 CAPSULE BY MOUTH EVERY DAY     Gastroenterology: Proton Pump Inhibitors Passed - 03/12/2023  8:55 AM      Passed - Valid encounter within last 12 months    Recent Outpatient Visits           4 months ago Encounter for general adult medical examination with abnormal findings   [facility] [place], [place] W, NP   10 months ago Type 2 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 disease, without long-term current use of insulin [facility])   [facility] [place], Salvadore Oxford, NP   1 year ago Postmenopausal vaginal bleeding   [place] [facility] [place], Salvadore Oxford, NP   1 year ago Encounter for general adult medical examination with abnormal findings   [facility] [place], Salvadore Oxford, NP   1 year ago Excessive daytime sleepiness   [place] [facility] [place], Salvadore Oxford, NP       Future Appointments             In 2 weeks Dellis Anes, Hetty Ely, MD [place] [facility] [place] [facility]

## 2023-03-24 ENCOUNTER — Telehealth: Payer: Self-pay | Admitting: Internal Medicine

## 2023-03-24 NOTE — Telephone Encounter (Signed)
Copied from CRM [id number]. Topic: Medicare AWV >> Mar 24, 2023  3:05 PM Payton Doughty wrote: Reason for CRM: Called patient to schedule Medicare Annual Wellness Visit (AWV). Left message for patient to call back and schedule Medicare Annual Wellness Visit (AWV).  Last date of AWV: 04/06/22  Please schedule an appointment at any time with [name] Bucker, LPN  .  If any questions, please contact me.  Thank you ,  Verlee Rossetti; Care Guide Ambulatory Clinical Support [place] [facility] Direct Dial: [phone]

## 2023-03-28 ENCOUNTER — Telehealth: Payer: Self-pay | Admitting: Internal Medicine

## 2023-03-28 NOTE — Telephone Encounter (Signed)
Contacted Maria Franklin to schedule their annual wellness visit. Appointment made for 04/22/2023.  Maria Franklin; Care Guide Ambulatory Clinical Support [place] [facility] Direct Dial: [phone]

## 2023-03-29 ENCOUNTER — Ambulatory Visit: Payer: Medicare HMO | Admitting: Allergy & Immunology

## 2023-04-09 ENCOUNTER — Other Ambulatory Visit: Payer: Self-pay | Admitting: Internal Medicine

## 2023-04-09 DIAGNOSIS — I10 Essential (primary) hypertension: Secondary | ICD-10-CM

## 2023-04-11 NOTE — Telephone Encounter (Signed)
Requested Prescriptions  Pending Prescriptions Disp Refills   amLODipine (NORVASC) 10 MG tablet [Pharmacy Med Name: AMLODIPINE BESYLATE 10 MG TAB] 90 tablet 0    Sig: TAKE 1 TABLET BY MOUTH EVERY DAY     Cardiovascular: Calcium Channel Blockers 2 Passed - 04/09/2023  9:35 AM      Passed - Last BP in normal range    BP Readings from Last 1 Encounters:  11/04/22 113/61         Passed - Last Heart Rate in normal range    Pulse Readings from Last 1 Encounters:  11/04/22 70         Passed - Valid encounter within last 6 months    Recent Outpatient Visits           5 months ago Encounter for general adult medical examination with abnormal findings   [facility] [place], [place] W, NP   11 months ago Type 2 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 disease, without long-term current use of insulin [facility])   [place] [facility] [place], Salvadore Oxford, NP   1 year ago Postmenopausal vaginal bleeding   [facility] [place], Salvadore Oxford, NP   1 year ago Encounter for general adult medical examination with abnormal findings   [place] [facility] [place], Salvadore Oxford, NP   1 year ago Excessive daytime sleepiness   [place] [facility] [place], Salvadore Oxford, NP       Future Appointments             In 1 week Sampson Si, Salvadore Oxford, NP [place] [facility], [facility]

## 2023-04-18 ENCOUNTER — Encounter: Payer: Self-pay | Admitting: Internal Medicine

## 2023-04-18 ENCOUNTER — Ambulatory Visit (INDEPENDENT_AMBULATORY_CARE_PROVIDER_SITE_OTHER): Payer: Medicare HMO | Admitting: Internal Medicine

## 2023-04-18 VITALS — BP 116/82 | HR 70 | Temp 96.8°F | Wt 252.0 lb

## 2023-04-18 DIAGNOSIS — E785 Hyperlipidemia, unspecified: Secondary | ICD-10-CM

## 2023-04-18 DIAGNOSIS — E1122 Type 2 diabetes mellitus with diabetic chronic kidney disease: Secondary | ICD-10-CM

## 2023-04-18 DIAGNOSIS — I10 Essential (primary) hypertension: Secondary | ICD-10-CM

## 2023-04-18 DIAGNOSIS — F200 Paranoid schizophrenia: Secondary | ICD-10-CM

## 2023-04-18 DIAGNOSIS — K122 Cellulitis and abscess of mouth: Secondary | ICD-10-CM | POA: Diagnosis not present

## 2023-04-18 DIAGNOSIS — G4733 Obstructive sleep apnea (adult) (pediatric): Secondary | ICD-10-CM | POA: Diagnosis not present

## 2023-04-18 DIAGNOSIS — N1831 Chronic kidney disease, stage 3a: Secondary | ICD-10-CM | POA: Diagnosis not present

## 2023-04-18 DIAGNOSIS — J4540 Moderate persistent asthma, uncomplicated: Secondary | ICD-10-CM | POA: Diagnosis not present

## 2023-04-18 DIAGNOSIS — E1169 Type 2 diabetes mellitus with other specified complication: Secondary | ICD-10-CM | POA: Diagnosis not present

## 2023-04-18 DIAGNOSIS — Z6841 Body Mass Index (BMI) 40.0 and over, adult: Secondary | ICD-10-CM

## 2023-04-18 DIAGNOSIS — E039 Hypothyroidism, unspecified: Secondary | ICD-10-CM

## 2023-04-18 DIAGNOSIS — K219 Gastro-esophageal reflux disease without esophagitis: Secondary | ICD-10-CM | POA: Diagnosis not present

## 2023-04-18 LAB — CBC
Hemoglobin: 10.9 g/dL — ABNORMAL LOW (ref 11.7–15.5)
Platelets: 232 10*3/uL (ref 140–400)
RDW: 12.7 % (ref 11.0–15.0)
WBC: 6.4 10*3/uL (ref 3.8–10.8)

## 2023-04-18 LAB — POCT GLYCOSYLATED HEMOGLOBIN (HGB A1C): HbA1c, POC (controlled diabetic range): 6.2 % (ref 0.0–7.0)

## 2023-04-18 NOTE — Progress Notes (Signed)
Subjective:    Patient ID: Maria Franklin, female    DOB: 08-Apr-1961, 62 y.o.   MRN: [id number]  HPI  Patient presents to clinic today for 6-month follow-up of chronic conditions.  HTN: Her BP today is 116/82.  She is taking Amlodipine, Carvedilol, Hydralazine and Lisinopril as prescribed.  ECG from 11/2020 reviewed.  HLD with Ludwigs Angina: Her last LDL was 72, triglycerides 85, 10/2022.  She denies myalgias on Atorvastatin.  She is taking Aspirin daily.  She tries to consume a low-fat diet.  GERD: Triggered by eating late at night.  She denies breakthrough on Omeprazole.  There is no upper GI on file.  Asthma: She denies chronic cough but has shortness of breath.  She is taking Advair and Albuterol as prescribed.  PFTs from 09/2022 reviewed.  She follows with pulmonology.  OSA: She averages 8 hours of sleep per night without the use of her CPAP.  Sleep study from 10/2019 reviewed.  Hypothyroidism: She denies any issues on her current dose of Levothyroxine.  She does not follow with endocrinology.  DM2: Her last A1c was 6.2%, 10/2022.  She is not currently taking any oral diabetic medication.  She does not check her sugars.  She checks her feet routinely.  Her last eye exam was 01/2023.  Flu 10/2022.  Pneumovax 03/2019.  COVID Pfizer x 3.  CKD 3: Her last creatinine was 1.17, GFR 53, 10/2022.  She is taking Lisinopril for renal protection.  She follows with nephrology.  Paranoid Schizophrenia: Managed with Haloperidol and Sertraline.  She follows with psychiatry.  Review of Systems     Past Medical History:  Diagnosis Date   Allergy    Anemia    Angio-edema    Asthma    ALL THE TIME   LAST FLARE UP 12/2018   Chest pain 08/2019   Chicken pox    Chronic kidney disease    Diabetes mellitus without complication (HCC)    GERD (gastroesophageal reflux disease)    Hyperlipidemia    Hypertension    Hyperthyroidism    Recurrent upper respiratory infection (URI)    Schizophrenia  (HCC)    Sleep apnea    DX 2-3 YRS AGO.   Sleep apnea    Thyroid disease    Urticaria     Current Outpatient Medications  Medication Sig Dispense Refill   ADVAIR DISKUS 250-50 MCG/ACT AEPB Inhale 1 puff into the lungs in the morning and at bedtime. 60 each 5   albuterol (PROVENTIL) (2.5 MG/3ML) 0.083% nebulizer solution Take 3 mLs (2.5 mg total) by nebulization every 4 (four) hours as needed for wheezing or shortness of breath. 75 mL 1   albuterol (VENTOLIN HFA) 108 (90 Base) MCG/ACT inhaler INHALE 1 TO 2 PUFFS INTO THE LUNGS EVERY 6 HOURS AS NEEDED WHEEZING/SHORTNESS OF BREATH 18 each 1   amLODipine (NORVASC) 10 MG tablet TAKE 1 TABLET BY MOUTH EVERY DAY 90 tablet 0   aspirin EC 81 MG EC tablet Take 1 tablet (81 mg total) by mouth daily. 30 tablet 0   atorvastatin (LIPITOR) 40 MG tablet TAKE 1 TABLET BY MOUTH EVERY DAY AT 6PM 90 tablet 1   b complex vitamins capsule Take 1 capsule by mouth daily.     Boswellia-Glucosamine-Vit D (OSTEO BI-FLEX ONE PER DAY PO) Take by mouth.     carvedilol (COREG) 12.5 MG tablet TAKE 1 TABLET BY MOUTH TWICE A DAY WITH MEALS 180 tablet 1   cetirizine (ZYRTEC) 10 MG tablet  Take 1 tablet (10 mg total) by mouth daily. 30 tablet 5   Ferrous Sulfate (IRON) 325 (65 Fe) MG TABS Take 1 tablet by mouth every morning.     fluticasone (FLONASE) 50 MCG/ACT nasal spray SPRAY 2 SPRAYS INTO EACH NOSTRIL EVERY DAY 16 mL 5   haloperidol (HALDOL) 5 MG tablet TAKE 0.5 TABLETS (2.5 MG TOTAL) BY MOUTH AT BEDTIME. TAKE 1/2 TABLET AT BED TIME 15 tablet 2   hydrALAZINE (APRESOLINE) 25 MG tablet TAKE 1/2 TABLET BY MOUTH EVERY MORNING AND AT BEDTIME 90 tablet 1   levothyroxine (SYNTHROID) 50 MCG tablet TAKE 1 TABLET BY MOUTH EVERY DAY BEFORE BREAKFAST 90 tablet 0   lisinopril (ZESTRIL) 40 MG tablet TAKE 1 TABLET BY MOUTH EVERY DAY 90 tablet 1   Multiple Vitamins-Minerals (MULTIVITAMIN WITH MINERALS) tablet Take 1 tablet by mouth every evening.     omeprazole (PRILOSEC) 40 MG capsule  TAKE 1 CAPSULE BY MOUTH EVERY DAY 90 capsule 0   sertraline (ZOLOFT) 50 MG tablet Take 1 tablet (50 mg total) by mouth daily. 30 tablet 2   No current facility-administered medications for this visit.    Allergies  Allergen Reactions   Bee Pollen Cough   Pollen Extract Cough   Latex Itching, Rash and Other (See Comments)    Severe itching    Family History  Problem Relation Age of Onset   Mental illness Mother    Hypertension Mother    Schizophrenia Mother    Depression Mother    Heart disease Mother    Diabetes Mother    Cancer Father        LUNG   Hypertension Sister    Cancer Sister        BREAST   Breast cancer Sister    Hypertension Sister    Heart attack Sister    Hypertension Sister    Heart attack Sister    Mental illness Brother    Hypertension Brother    Schizophrenia Brother    Depression Brother    Hypertension Brother    Schizophrenia Brother    Hypertension Brother    Breast cancer Maternal Aunt    Schizophrenia Maternal Uncle    Heart disease Maternal Grandmother    Depression Maternal Grandmother    Schizophrenia Maternal Grandmother    Allergic rhinitis Neg Hx    Angioedema Neg Hx    Asthma Neg Hx    Atopy Neg Hx    Eczema Neg Hx    Immunodeficiency Neg Hx    Urticaria Neg Hx    Colon cancer Neg Hx    Colon polyps Neg Hx    Stomach cancer Neg Hx    Rectal cancer Neg Hx     Social History   Socioeconomic History   Marital status: Married    Spouse name: Rae Roam   Number of children: 4   Years of education: Associate Degree   Highest education level: Associate degree: academic program  Occupational History   Occupation: unemployed  Tobacco Use   Smoking status: Former    Packs/day: 0.25    Years: 6.00    Additional pack years: 0.00    Total pack years: 1.50    Types: Cigarettes    Quit date: 1987    Years since quitting: 37.3   Smokeless tobacco: Never   Tobacco comments:    QUIT IN HER EARLY 20'S  Vaping Use    Vaping Use: Never used  Substance and Sexual Activity   Alcohol  use: No    Alcohol/week: 0.0 standard drinks of alcohol   Drug use: No   Sexual activity: Not Currently  Other Topics Concern   Not on file  Social History Narrative   Pt is R handed   Lives in single story home with her husband and father-in-law   Has 4 children   Associates Degree x2   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   Social Determinants of Health   Financial Resource Strain: Patient Declined (04/14/2023)   Overall Financial Resource Strain (CARDIA)    Difficulty of Paying Living Expenses: Patient declined  Food Insecurity: Patient Declined (04/14/2023)   Hunger Vital Sign    Worried About Running Out of Food in the Last Year: Patient declined    Ran Out of Food in the Last Year: Patient declined  Transportation Needs: No Transportation Needs (04/14/2023)   PRAPARE - Administrator, Civil Service (Medical): No    Lack of Transportation (Non-Medical): No  Physical Activity: Unknown (04/14/2023)   Exercise Vital Sign    Days of Exercise per Week: Patient declined    Minutes of Exercise per Session: Not on file  Stress: No Stress Concern Present (04/14/2023)   [employer] of Occupational Health - Occupational Stress Questionnaire    Feeling of Stress : Not at all  Social Connections: Unknown (04/14/2023)   Social Connection and Isolation Panel [NHANES]    Frequency of Communication with Friends and Family: Once a week    Frequency of Social Gatherings with Friends and Family: Patient declined    Attends Religious Services: More than 4 times per year    Active Member of [employer] or Organizations: Yes    Attends Engineer, structural: More than 4 times per year    Marital Status: Married  Catering manager Violence: Not At Risk (07/28/2020)   Humiliation, Afraid, Rape, and Kick questionnaire    Fear of Current or Ex-Partner: No    Emotionally  Abused: No    Physically Abused: No    Sexually Abused: No     Constitutional: Denies fever, malaise, fatigue, headache or abrupt weight changes.  HEENT: Denies eye pain, eye redness, ear pain, ringing in the ears, wax buildup, runny nose, nasal congestion, bloody nose, or sore throat. Respiratory: Denies difficulty breathing, shortness of breath, cough or sputum production.   Cardiovascular: Denies chest pain, chest tightness, palpitations or swelling in the hands or feet.  Gastrointestinal: Denies abdominal pain, bloating, constipation, diarrhea or blood in the stool.  GU: Denies urgency, frequency, pain with urination, burning sensation, blood in urine, odor or discharge. Musculoskeletal: Denies decrease in range of motion, difficulty with gait, muscle pain or joint pain and swelling.  Skin: Denies redness, rashes, lesions or ulcercations.  Neurological: Denies dizziness, difficulty with memory, difficulty with speech or problems with balance and coordination.  Psych: Denies anxiety, depression, SI/HI.  No other specific complaints in a complete review of systems (except as listed in HPI above).  Objective:   Physical Exam   BP 116/82 (BP Location: Left Arm, Patient Position: Sitting, Cuff Size: Large)   Pulse 70   Temp (!) 96.8 F (36 C) (Temporal)   Wt 252 lb (114.3 kg)   LMP 02/02/2017 (Approximate)   SpO2 98%   BMI 44.64 kg/m   Wt Readings from Last 3 Encounters:  11/04/22 250 lb (113.4 kg)  09/28/22 256 lb 9.6 oz (116.4 kg)  05/04/22 259 lb (117.5  kg)    General: Appears her stated age, obese, in NAD. Skin: Warm, dry and intact. No ulcerations noted. HEENT: Head: normal shape and size; Eyes: sclera white, no icterus, conjunctiva pink, PERRLA and EOMs intact;  Neck:  Neck supple, trachea midline. No masses, lumps present.  Cardiovascular: Normal rate and rhythm. S1,S2 noted.  No murmur, rubs or gallops noted. No JVD or BLE edema. No carotid bruits  noted. Pulmonary/Chest: Normal effort and positive vesicular breath sounds. No respiratory distress. No wheezes, rales or ronchi noted.  Abdomen: . Normal bowel sounds.  Musculoskeletal: No difficulty with gait.  Neurological: Alert and oriented. Cranial nerves II-XII grossly intact. Coordination normal.  Psychiatric: Mood and affect normal. Behavior is normal. Judgment and thought content normal.     BMET    Component Value Date/Time   NA 141 11/04/2022 0929   K 4.3 11/04/2022 0929   CL 108 11/04/2022 0929   CO2 23 11/04/2022 0929   GLUCOSE 107 (H) 11/04/2022 0929   BUN 16 11/04/2022 0929   CREATININE 1.17 (H) 11/04/2022 0929   CALCIUM 9.4 11/04/2022 0929   GFRNONAA 44 (L) 11/18/2020 0939   GFRNONAA 53 (L) 07/28/2020 0942   GFRAA 61 07/28/2020 0942    Lipid Panel     Component Value Date/Time   CHOL 160 11/04/2022 0929   TRIG 85 11/04/2022 0929   HDL 71 11/04/2022 0929   CHOLHDL 2.3 11/04/2022 0929   VLDL 18 10/17/2019 0630   LDLCALC 72 11/04/2022 0929    CBC    Component Value Date/Time   WBC 6.7 11/04/2022 0929   RBC 4.07 11/04/2022 0929   HGB 11.4 (L) 11/04/2022 0929   HCT 35.0 11/04/2022 0929   PLT 259 11/04/2022 0929   MCV 86.0 11/04/2022 0929   MCH 28.0 11/04/2022 0929   MCHC 32.6 11/04/2022 0929   RDW 12.7 11/04/2022 0929   LYMPHSABS 2.6 11/18/2020 0939   MONOABS 0.5 11/18/2020 0939   EOSABS 0.4 11/18/2020 0939   BASOSABS 0.0 11/18/2020 0939    Hgb A1C Lab Results  Component Value Date   HGBA1C 6.2 (H) 11/04/2022           Assessment & Plan:      RTC in 6 months for annual exam Nicki Reaper, NP

## 2023-04-18 NOTE — Assessment & Plan Note (Signed)
Stable on Advair and albuterol She will continue to follow with pulmonology

## 2023-04-18 NOTE — Assessment & Plan Note (Signed)
Noncompliant with CPAP use Encouraged weight loss as this can help reduce sleep apnea symptoms 

## 2023-04-18 NOTE — Assessment & Plan Note (Signed)
C-Met and lipid profile today Encouraged her to consume a low-fat diet Continue atorvastatin 

## 2023-04-18 NOTE — Assessment & Plan Note (Signed)
Asymptomatic. 

## 2023-04-18 NOTE — Assessment & Plan Note (Signed)
TSH and free T4 today We will adjust levothyroxine if needed based on labs 

## 2023-04-18 NOTE — Assessment & Plan Note (Signed)
Encourage diet and exercise for weight loss 

## 2023-04-18 NOTE — Patient Instructions (Signed)

## 2023-04-18 NOTE — Assessment & Plan Note (Signed)
Stable on sertraline and haloperidol She will continue to follow with psychiatry

## 2023-04-18 NOTE — Assessment & Plan Note (Signed)
POCT A1c 6.2% We will check urine microalbumin Encouraged low-carb diet and exercise for weight loss Not medicated We will request copy of eye exam Encouraged routine foot exam Encouraged her to get a flu shot in the fall Pneumovax UTD Encouraged her to get her COVID booster

## 2023-04-18 NOTE — Assessment & Plan Note (Signed)
Continue lisinopril for renal protection She will continue to follow with nephrology 

## 2023-04-18 NOTE — Assessment & Plan Note (Signed)
Controlled on amlodipine, carvedilol, hydralazine and lisinopril Reinforced DASH diet and exercise for weight loss C-Met today

## 2023-04-18 NOTE — Assessment & Plan Note (Signed)
Avoid eating late at night Encourage weight loss as this can help reduce reflux symptoms Continue omeprazole

## 2023-04-19 LAB — COMPLETE METABOLIC PANEL WITH GFR
AG Ratio: 1.4 (calc) (ref 1.0–2.5)
ALT: 18 U/L (ref 6–29)
AST: 16 U/L (ref 10–35)
Albumin: 4 g/dL (ref 3.6–5.1)
Alkaline phosphatase (APISO): 85 U/L (ref 37–153)
BUN/Creatinine Ratio: 13 (calc) (ref 6–22)
BUN: 15 mg/dL (ref 7–25)
CO2: 28 mmol/L (ref 20–32)
Calcium: 9.4 mg/dL (ref 8.6–10.4)
Chloride: 106 mmol/L (ref 98–110)
Creat: 1.14 mg/dL — ABNORMAL HIGH (ref 0.50–1.05)
Globulin: 2.8 g/dL (calc) (ref 1.9–3.7)
Glucose, Bld: 111 mg/dL — ABNORMAL HIGH (ref 65–99)
Potassium: 4.5 mmol/L (ref 3.5–5.3)
Sodium: 141 mmol/L (ref 135–146)
Total Bilirubin: 0.4 mg/dL (ref 0.2–1.2)
Total Protein: 6.8 g/dL (ref 6.1–8.1)
eGFR: 55 mL/min/{1.73_m2} — ABNORMAL LOW (ref >=60)

## 2023-04-19 LAB — CBC
HCT: 34.5 % — ABNORMAL LOW (ref 35.0–45.0)
MCH: 27.6 pg (ref 27.0–33.0)
MCHC: 31.6 g/dL — ABNORMAL LOW (ref 32.0–36.0)
MCV: 87.3 fL (ref 80.0–100.0)
MPV: 9.7 fL (ref 7.5–12.5)
RBC: 3.95 10*6/uL (ref 3.80–5.10)

## 2023-04-19 LAB — LIPID PANEL
Cholesterol: 169 mg/dL (ref <200)
HDL: 73 mg/dL (ref >=50)
LDL Cholesterol (Calc): 78 mg/dL (calc)
Non-HDL Cholesterol (Calc): 96 mg/dL (calc) (ref <130)
Total CHOL/HDL Ratio: 2.3 (calc) (ref <5.0)
Triglycerides: 98 mg/dL (ref <150)

## 2023-04-19 LAB — T4, FREE: Free T4: 0.9 ng/dL (ref 0.8–1.8)

## 2023-04-19 LAB — MICROALBUMIN / CREATININE URINE RATIO
Creatinine, Urine: 168 mg/dL (ref 20–275)
Microalb Creat Ratio: 5 mg/g creat (ref <30)
Microalb, Ur: 0.9 mg/dL

## 2023-04-19 LAB — TSH: TSH: 2.74 mIU/L (ref 0.40–4.50)

## 2023-04-22 ENCOUNTER — Ambulatory Visit (INDEPENDENT_AMBULATORY_CARE_PROVIDER_SITE_OTHER): Payer: Medicare HMO

## 2023-04-22 VITALS — Ht 63.0 in | Wt 252.0 lb

## 2023-04-22 DIAGNOSIS — Z0000 Encounter for general adult medical examination without abnormal findings: Secondary | ICD-10-CM

## 2023-04-22 NOTE — Progress Notes (Signed)
I connected with  Maria Franklin on 04/22/23 by a audio enabled telemedicine application and verified that I am speaking with the correct person using two identifiers.  Patient Location: Home  Provider Location: Office/Clinic  I discussed the limitations of evaluation and management by telemedicine. The patient expressed understanding and agreed to proceed.  Subjective:   Maria Franklin is a 62 y.o. female who presents for Medicare Annual (Subsequent) preventive examination.  Review of Systems     Cardiac Risk Factors include: advanced age (>33men, >68 women);diabetes mellitus;dyslipidemia;hypertension     Objective:    There were no vitals filed for this visit. There is no height or weight on file to calculate BMI.     04/22/2023    1:41 PM 11/18/2020    7:02 AM 07/28/2020    9:52 AM 10/16/2019    3:00 PM 10/16/2019    1:59 PM 08/30/2019    8:26 AM 08/21/2019    9:58 AM  Advanced Directives  Does Patient Have a Medical Advance Directive? No No No   No No  Would patient like information on creating a medical advance directive? No - Patient declined  No - Patient declined   No - Patient declined No - Patient declined     Information is confidential and restricted. Go to Review Flowsheets to unlock data.    Current Medications (verified) Outpatient Encounter Medications as of 04/22/2023  Medication Sig   ADVAIR DISKUS 250-50 MCG/ACT AEPB Inhale 1 puff into the lungs in the morning and at bedtime.   albuterol (PROVENTIL) (2.5 MG/3ML) 0.083% nebulizer solution Take 3 mLs (2.5 mg total) by nebulization every 4 (four) hours as needed for wheezing or shortness of breath.   albuterol (VENTOLIN HFA) 108 (90 Base) MCG/ACT inhaler INHALE 1 TO 2 PUFFS INTO THE LUNGS EVERY 6 HOURS AS NEEDED WHEEZING/SHORTNESS OF BREATH   amLODipine (NORVASC) 10 MG tablet TAKE 1 TABLET BY MOUTH EVERY DAY   aspirin EC 81 MG EC tablet Take 1 tablet (81 mg total) by mouth daily.   atorvastatin (LIPITOR) 40  MG tablet TAKE 1 TABLET BY MOUTH EVERY DAY AT 6PM   b complex vitamins capsule Take 1 capsule by mouth daily.   Boswellia-Glucosamine-Vit D (OSTEO BI-FLEX ONE PER DAY PO) Take by mouth.   carvedilol (COREG) 12.5 MG tablet TAKE 1 TABLET BY MOUTH TWICE A DAY WITH MEALS   cetirizine (ZYRTEC) 10 MG tablet Take 1 tablet (10 mg total) by mouth daily.   Ferrous Sulfate (IRON) 325 (65 Fe) MG TABS Take 1 tablet by mouth every morning.   fluticasone (FLONASE) 50 MCG/ACT nasal spray SPRAY 2 SPRAYS INTO EACH NOSTRIL EVERY DAY   haloperidol (HALDOL) 5 MG tablet TAKE 0.5 TABLETS (2.5 MG TOTAL) BY MOUTH AT BEDTIME. TAKE 1/2 TABLET AT BED TIME   hydrALAZINE (APRESOLINE) 25 MG tablet TAKE 1/2 TABLET BY MOUTH EVERY MORNING AND AT BEDTIME   levothyroxine (SYNTHROID) 50 MCG tablet TAKE 1 TABLET BY MOUTH EVERY DAY BEFORE BREAKFAST   lisinopril (ZESTRIL) 40 MG tablet TAKE 1 TABLET BY MOUTH EVERY DAY   Multiple Vitamins-Minerals (MULTIVITAMIN WITH MINERALS) tablet Take 1 tablet by mouth every evening.   omeprazole (PRILOSEC) 40 MG capsule TAKE 1 CAPSULE BY MOUTH EVERY DAY   sertraline (ZOLOFT) 50 MG tablet Take 1 tablet (50 mg total) by mouth daily.   No facility-administered encounter medications on file as of 04/22/2023.    Allergies (verified) Bee pollen, Pollen extract, and Latex   History: Past Medical  History:  Diagnosis Date   Allergy    Anemia    Angio-edema    Asthma    ALL THE TIME   LAST FLARE UP 12/2018   Chest pain 08/2019   Chicken pox    Chronic kidney disease    Diabetes mellitus without complication (HCC)    GERD (gastroesophageal reflux disease)    Hyperlipidemia    Hypertension    Hyperthyroidism    Recurrent upper respiratory infection (URI)    Schizophrenia (HCC)    Sleep apnea    DX 2-3 YRS AGO.   Sleep apnea    Thyroid disease    Urticaria    Past Surgical History:  Procedure Laterality Date   left adrenal gland removal  07/2019   SINOSCOPY     SINUS ENDO W/FUSION  Bilateral 01/05/2019   Procedure: ENDOSCOPIC SINUS SURGERY WITH NAVIGATION;  Surgeon: Osborn Coho, MD;  Location: [facility] OR;  Service: ENT;  Laterality: Bilateral;   SINUS IRRIGATION     TONSILLECTOMY     TUBAL LIGATION     TURBINATE REDUCTION N/A 01/05/2019   Procedure: Turbinate Reduction;  Surgeon: Osborn Coho, MD;  Location: [facility] OR;  Service: ENT;  Laterality: N/A;   Family History  Problem Relation Age of Onset   Mental illness Mother    Hypertension Mother    Schizophrenia Mother    Depression Mother    Heart disease Mother    Diabetes Mother    Cancer Father        LUNG   Hypertension Sister    Cancer Sister        BREAST   Breast cancer Sister    Hypertension Sister    Heart attack Sister    Hypertension Sister    Heart attack Sister    Mental illness Brother    Hypertension Brother    Schizophrenia Brother    Depression Brother    Hypertension Brother    Schizophrenia Brother    Hypertension Brother    Breast cancer Maternal Aunt    Schizophrenia Maternal Uncle    Heart disease Maternal Grandmother    Depression Maternal Grandmother    Schizophrenia Maternal Grandmother    Allergic rhinitis Neg Hx    Angioedema Neg Hx    Asthma Neg Hx    Atopy Neg Hx    Eczema Neg Hx    Immunodeficiency Neg Hx    Urticaria Neg Hx    Colon cancer Neg Hx    Colon polyps Neg Hx    Stomach cancer Neg Hx    Rectal cancer Neg Hx    Social History   Socioeconomic History   Marital status: Married    Spouse name: Rae Roam   Number of children: 4   Years of education: Associate Degree   Highest education level: Associate degree: academic program  Occupational History   Occupation: unemployed  Tobacco Use   Smoking status: Former    Packs/day: 0.25    Years: 6.00    Additional pack years: 0.00    Total pack years: 1.50    Types: Cigarettes    Quit date: 1987    Years since quitting: 37.4   Smokeless tobacco: Never   Tobacco comments:    QUIT IN HER  EARLY 20'S  Vaping Use   Vaping Use: Never used  Substance and Sexual Activity   Alcohol use: No    Alcohol/week: 0.0 standard drinks of alcohol   Drug use: No   Sexual activity:  Not Currently  Other Topics Concern   Not on file  Social History Narrative   Pt is R handed   Lives in single story home with her husband and father-in-law   Has 4 children   Associates Degree x2   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   Social Determinants of Health   Financial Resource Strain: Low Risk  (04/22/2023)   Overall Financial Resource Strain (CARDIA)    Difficulty of Paying Living Expenses: Not hard at all  Food Insecurity: No Food Insecurity (04/22/2023)   Hunger Vital Sign    Worried About Running Out of Food in the Last Year: Never true    Ran Out of Food in the Last Year: Never true  Transportation Needs: No Transportation Needs (04/22/2023)   PRAPARE - Administrator, Civil Service (Medical): No    Lack of Transportation (Non-Medical): No  Physical Activity: Inactive (04/22/2023)   Exercise Vital Sign    Days of Exercise per Week: 0 days    Minutes of Exercise per Session: 0 min  Stress: No Stress Concern Present (04/22/2023)   [employer] of Occupational Health - Occupational Stress Questionnaire    Feeling of Stress : Not at all  Social Connections: Socially Integrated (04/22/2023)   Social Connection and Isolation Panel [NHANES]    Frequency of Communication with Friends and Family: Twice a week    Frequency of Social Gatherings with Friends and Family: Once a week    Attends Religious Services: More than 4 times per year    Active Member of [employer] or Organizations: Yes    Attends Engineer, structural: More than 4 times per year    Marital Status: Married    Tobacco Counseling Counseling given: Not Answered Tobacco comments: QUIT IN HER EARLY 20'S   Clinical Intake:  Pre-visit preparation completed:  Yes  Pain : No/denies pain     Nutritional Risks: None Diabetes: Yes CBG done?: No Did pt. bring in CBG monitor from home?: No  How often do you need to have someone help you when you read instructions, pamphlets, or other written materials from your doctor or pharmacy?: 1 - Never  Diabetic?yes Nutrition Risk Assessment:  Has the patient had any N/V/D within the last 2 months?  No  Does the patient have any non-healing wounds?  No  Has the patient had any unintentional weight loss or weight gain?  No   Diabetes:  Is the patient diabetic?  Yes  If diabetic, was a CBG obtained today?  No  Did the patient bring in their glucometer from home?  No  How often do you monitor your CBG's? never.   Financial Strains and Diabetes Management:  Are you having any financial strains with the device, your supplies or your medication? No .  Does the patient want to be seen by Chronic Care Management for management of their diabetes?  No  Would the patient like to be referred to a Nutritionist or for Diabetic Management?  No   Diabetic Exams:  Diabetic Eye Exam: Completed 10/02/21. Overdue for diabetic eye exam. Pt has been advised about the importance in completing this exam.  Diabetic Foot Exam: Completed 11/04/22. Pt has been advised about the importance in completing this exam.    Interpreter Needed?: No  Information entered by :: [name] Bucker, LPN   Activities of Daily Living    04/22/2023    1:41 PM 04/21/2023  2:48 PM  In your present state of health, do you have any difficulty performing the following activities:  Hearing? 0 0  Vision? 0 0  Difficulty concentrating or making decisions? 0 0  Walking or climbing stairs? 0 0  Dressing or bathing? 0 0  Doing errands, shopping? 0 0  Preparing Food and eating ? N N  Using the Toilet? N N  In the past six months, have you accidently leaked urine? N N  Do you have problems with loss of bowel control? N N  Managing your  Medications? N N  Managing your Finances? N N  Housekeeping or managing your Housekeeping? N N    Patient Care Team: Lorre Munroe, NP as PCP - General (Internal Medicine) Corky Crafts, MD as PCP - Cardiology (Cardiology)  Indicate any recent Medical Services you may have received from other than [facility] providers in the past year (date may be approximate).     Assessment:   This is a routine wellness examination for Maria Franklin.  Hearing/Vision screen Hearing Screening - Comments:: No aids Vision Screening - Comments:: Wears glasses- [facility]   Dietary issues and exercise activities discussed: Current Exercise Habits: The patient does not participate in regular exercise at present   Goals Addressed             This Visit's Progress    DIET - EAT MORE FRUITS AND VEGETABLES         Depression Screen    04/22/2023    1:39 PM 04/18/2023    9:58 AM 11/04/2022    9:28 AM 11/04/2022    9:17 AM 08/28/2022   11:40 AM 05/04/2022    9:47 AM 04/06/2022    3:57 PM  PHQ 2/9 Scores  PHQ - 2 Score 0 3 2 2  1 6   PHQ- 9 Score 0 15 3 3  3 6      Information is confidential and restricted. Go to Review Flowsheets to unlock data.    Fall Risk    04/22/2023    1:41 PM 04/21/2023    2:48 PM 04/18/2023    9:58 AM 11/04/2022    9:28 AM 11/04/2022    9:17 AM  Fall Risk   Falls in the past year? 0 0 0 0 0  Number falls in past yr: 0 0   0  Injury with Fall? 0  0 0 0  Risk for fall due to : No Fall Risks  No Fall Risks  No Fall Risks  Follow up Falls prevention discussed;Falls evaluation completed    Falls evaluation completed    FALL RISK PREVENTION PERTAINING TO THE HOME:  Any stairs in or around the home? Yes  If so, are there any without handrails? No  Home free of loose throw rugs in walkways, pet beds, electrical cords, etc? Yes  Adequate lighting in your home to reduce risk of falls? Yes   ASSISTIVE DEVICES UTILIZED TO PREVENT FALLS:  Life alert? No  Use of a cane,  walker or w/c? No  Grab bars in the bathroom? No  Shower chair or bench in shower? No  Elevated toilet seat or a handicapped toilet? No    Cognitive Function:      02/07/2019    9:00 AM  Montreal Cognitive Assessment   Visuospatial/ Executive (0/5) 4  Naming (0/3) 3  Attention: Read list of digits (0/2) 2  Attention: Read list of letters (0/1) 1  Attention: Serial 7 subtraction starting at  100 (0/3) 2  Language: Repeat phrase (0/2) 2  Language : Fluency (0/1) 1  Abstraction (0/2) 2  Delayed Recall (0/5) 3  Orientation (0/6) 6  Total 26      04/22/2023    1:47 PM 04/06/2022    4:00 PM  6CIT Screen  What Year? 0 points 0 points  What month? 0 points 0 points  What time? 0 points 0 points  Count back from 20 0 points 0 points  Months in reverse 0 points 0 points  Repeat phrase 0 points 4 points  Total Score 0 points 4 points    Immunizations Immunization History  Administered Date(s) Administered   Influenza Inj Mdck Quad Pf 10/01/2020   Influenza,inj,Quad PF,6+ Mos 10/21/2014, 10/28/2015, 11/04/2016, 02/14/2018, 09/28/2018, 09/22/2021, 11/04/2022   PFIZER(Purple Top)SARS-COV-2 Vaccination 02/29/2020, 03/22/2020, 10/01/2020   Pfizer Covid-19 Vaccine Bivalent Booster 64yrs & up 09/22/2021   Pneumococcal Polysaccharide-23 10/21/2014, 03/21/2019   Td 04/18/2016    TDAP status: Up to date  Flu Vaccine status: Up to date  Pneumococcal vaccine status: Up to date  Covid-19 vaccine status: Completed vaccines  Qualifies for Shingles Vaccine? Yes   Zostavax completed No   Shingrix Completed?: No.    Education has been provided regarding the importance of this vaccine. Patient has been advised to call insurance company to determine out of pocket expense if they have not yet received this vaccine. Advised may also receive vaccine at local pharmacy or Health Dept. Verbalized acceptance and understanding. PT states had both Shingrix   Screening Tests Health Maintenance  Topic  Date Due   Zoster Vaccines- Shingrix (1 of 2) Never done   OPHTHALMOLOGY EXAM  10/02/2022   INFLUENZA VACCINE  07/07/2023   HEMOGLOBIN A1C  10/19/2023   FOOT EXAM  11/05/2023   Diabetic kidney evaluation - eGFR measurement  04/17/2024   Diabetic kidney evaluation - Urine ACR  04/17/2024   Medicare Annual Wellness (AWV)  04/21/2024   COLONOSCOPY (Pts 45-1yrs Insurance coverage will need to be confirmed)  12/21/2024   MAMMOGRAM  02/15/2025   DTaP/Tdap/Td (2 - Tdap) 04/18/2026   PAP SMEAR-Modifier  12/24/2026   Hepatitis C Screening  Completed   HIV Screening  Completed   HPV VACCINES  Aged Out   COVID-19 Vaccine  Discontinued    Health Maintenance  Health Maintenance Due  Topic Date Due   Zoster Vaccines- Shingrix (1 of 2) Never done   OPHTHALMOLOGY EXAM  10/02/2022    Colorectal cancer screening: Type of screening: Colonoscopy. Completed 12/21/21. Repeat every 3 years  Mammogram status: Completed 02/16/23. Repeat every year   Lung Cancer Screening: (Low Dose CT Chest recommended if Age 28-80 years, 30 pack-year currently smoking OR have quit w/in 15years.) does not qualify.    Additional Screening:  Hepatitis C Screening: does qualify; Completed 04/29/16  Vision Screening: Recommended annual ophthalmology exams for early detection of glaucoma and other disorders of the eye. Is the patient up to date with their annual eye exam?  Yes  Who is the provider or what is the name of the office in which the patient attends annual eye exams? [facility] If pt is not established with a provider, would they like to be referred to a provider to establish care? No .   Dental Screening: Recommended annual dental exams for proper oral hygiene  Community Resource Referral / Chronic Care Management: CRR required this visit?  No   CCM required this visit?  No  Plan:     I have personally reviewed and noted the following in the patient's chart:   Medical and social  history Use of alcohol, tobacco or illicit drugs  Current medications and supplements including opioid prescriptions. Patient is not currently taking opioid prescriptions. Functional ability and status Nutritional status Physical activity Advanced directives List of other physicians Hospitalizations, surgeries, and ER visits in previous 12 months Vitals Screenings to include cognitive, depression, and falls Referrals and appointments  In addition, I have reviewed and discussed with patient certain preventive protocols, quality metrics, and best practice recommendations. A written personalized care plan for preventive services as well as general preventive health recommendations were provided to patient.     Hal Hope, LPN   03/14/8118   Nurse Notes: none

## 2023-04-22 NOTE — Patient Instructions (Signed)
Maria Franklin , Thank you for taking time to come for your Medicare Wellness Visit. I appreciate your ongoing commitment to your health goals. Please review the following plan we discussed and let me know if I can assist you in the future.   These are the goals we discussed:  Goals       Activity and Exercise Increased (pt-stated)      The pt state she would like to get her own home this year.      Evidence-based guidance:  Review current exercise levels.  Assess patient perspective on exercise or activity level, barriers to increasing activity, motivation and readiness for change.  Recommend or set healthy exercise goal based on individual tolerance.  Encourage small steps toward making change in amount of exercise or activity.  Urge reduction of sedentary activities or screen time.  Promote group activities within the community or with family or support person.  Consider referral to rehabiliation therapist for assessment and exercise/activity plan.        DIET - EAT MORE FRUITS AND VEGETABLES      Patient Stated      I would like to be more active      Pharmacy Goals      Our goal bad cholesterol, or LDL, is less than 70 . This is why it is important to continue taking your atorvastatin.  Please check your home blood pressure, keep a log of the results and bring this with you to your medical appointments.   Feel free to call me with any questions or concerns.  Estelle Grumbles, PharmD, BCACP, CPP Clinical Pharmacist [facility] [place] [phone]      Weight (lb) < 160 lb (72.6 kg)      Patient would like to lose 100 lbs         This is a list of the screening recommended for you and due dates:  Health Maintenance  Topic Date Due   Zoster (Shingles) Vaccine (1 of 2) Never done   Eye exam for diabetics  10/02/2022   Flu Shot  07/07/2023   Hemoglobin A1C  10/19/2023   Complete foot exam   11/05/2023   Yearly kidney function blood test for  diabetes  04/17/2024   Yearly kidney health urinalysis for diabetes  04/17/2024   Medicare Annual Wellness Visit  04/21/2024   Colon Cancer Screening  12/21/2024   Mammogram  02/15/2025   DTaP/Tdap/Td vaccine (2 - Tdap) 04/18/2026   Pap Smear  12/24/2026   Hepatitis C Screening: USPSTF Recommendation to screen - Ages 18-79 yo.  Completed   HIV Screening  Completed   HPV Vaccine  Aged Out   COVID-19 Vaccine  Discontinued    Advanced directives: no  Conditions/risks identified: none  Next appointment: Follow up in one year for your annual wellness visit. 04/27/24 @ 8:45 am by phone  Preventive Care 40-64 Years, Female Preventive care refers to lifestyle choices and visits with your health care provider that can promote health and wellness. What does preventive care include? A yearly physical exam. This is also called an annual well check. Dental exams once or twice a year. Routine eye exams. Ask your health care provider how often you should have your eyes checked. Personal lifestyle choices, including: Daily care of your teeth and gums. Regular physical activity. Eating a healthy diet. Avoiding tobacco and drug use. Limiting alcohol use. Practicing safe sex. Taking low-dose aspirin daily starting at age 39. Taking vitamin and mineral  supplements as recommended by your health care provider. What happens during an annual well check? The services and screenings done by your health care provider during your annual well check will depend on your age, overall health, lifestyle risk factors, and family history of disease. Counseling  Your health care provider may ask you questions about your: Alcohol use. Tobacco use. Drug use. Emotional well-being. Home and relationship well-being. Sexual activity. Eating habits. Work and work Astronomer. Method of birth control. Menstrual cycle. Pregnancy history. Screening  You may have the following tests or measurements: Height,  weight, and BMI. Blood pressure. Lipid and cholesterol levels. These may be checked every 5 years, or more frequently if you are over 60 years old. Skin check. Lung cancer screening. You may have this screening every year starting at age 84 if you have a 30-pack-year history of smoking and currently smoke or have quit within the past 15 years. Fecal occult blood test (FOBT) of the stool. You may have this test every year starting at age 55. Flexible sigmoidoscopy or colonoscopy. You may have a sigmoidoscopy every 5 years or a colonoscopy every 10 years starting at age 72. Hepatitis C blood test. Hepatitis B blood test. Sexually transmitted disease (STD) testing. Diabetes screening. This is done by checking your blood sugar (glucose) after you have not eaten for a while (fasting). You may have this done every 1-3 years. Mammogram. This may be done every 1-2 years. Talk to your health care provider about when you should start having regular mammograms. This may depend on whether you have a family history of breast cancer. BRCA-related cancer screening. This may be done if you have a family history of breast, ovarian, tubal, or peritoneal cancers. Pelvic exam and Pap test. This may be done every 3 years starting at age 16. Starting at age 20, this may be done every 5 years if you have a Pap test in combination with an HPV test. Bone density scan. This is done to screen for osteoporosis. You may have this scan if you are at high risk for osteoporosis. Discuss your test results, treatment options, and if necessary, the need for more tests with your health care provider. Vaccines  Your health care provider may recommend certain vaccines, such as: Influenza vaccine. This is recommended every year. Tetanus, diphtheria, and acellular pertussis (Tdap, Td) vaccine. You may need a Td booster every 10 years. Zoster vaccine. You may need this after age 45. Pneumococcal 13-valent conjugate (PCV13) vaccine. You  may need this if you have certain conditions and were not previously vaccinated. Pneumococcal polysaccharide (PPSV23) vaccine. You may need one or two doses if you smoke cigarettes or if you have certain conditions. Talk to your health care provider about which screenings and vaccines you need and how often you need them. This information is not intended to replace advice given to you by your health care provider. Make sure you discuss any questions you have with your health care provider. Document Released: 12/19/2015 Document Revised: 08/11/2016 Document Reviewed: 09/23/2015 Elsevier Interactive Patient Education  2017 [employer].    Fall Prevention in the Home Falls can cause injuries. They can happen to people of all ages. There are many things you can do to make your home safe and to help prevent falls. What can I do on the outside of my home? Regularly fix the edges of walkways and driveways and fix any cracks. Remove anything that might make you trip as you walk through a door, such  as a raised step or threshold. Trim any bushes or trees on the path to your home. Use bright outdoor lighting. Clear any walking paths of anything that might make someone trip, such as rocks or tools. Regularly check to see if handrails are loose or broken. Make sure that both sides of any steps have handrails. Any raised decks and porches should have guardrails on the edges. Have any leaves, snow, or ice cleared regularly. Use sand or salt on walking paths during winter. Clean up any spills in your garage right away. This includes oil or grease spills. What can I do in the bathroom? Use night lights. Install grab bars by the toilet and in the tub and shower. Do not use towel bars as grab bars. Use non-skid mats or decals in the tub or shower. If you need to sit down in the shower, use a plastic, non-slip stool. Keep the floor dry. Clean up any water that spills on the floor as soon as it  happens. Remove soap buildup in the tub or shower regularly. Attach bath mats securely with double-sided non-slip rug tape. Do not have throw rugs and other things on the floor that can make you trip. What can I do in the bedroom? Use night lights. Make sure that you have a light by your bed that is easy to reach. Do not use any sheets or blankets that are too big for your bed. They should not hang down onto the floor. Have a firm chair that has side arms. You can use this for support while you get dressed. Do not have throw rugs and other things on the floor that can make you trip. What can I do in the kitchen? Clean up any spills right away. Avoid walking on wet floors. Keep items that you use a lot in easy-to-reach places. If you need to reach something above you, use a strong step stool that has a grab bar. Keep electrical cords out of the way. Do not use floor polish or wax that makes floors slippery. If you must use wax, use non-skid floor wax. Do not have throw rugs and other things on the floor that can make you trip. What can I do with my stairs? Do not leave any items on the stairs. Make sure that there are handrails on both sides of the stairs and use them. Fix handrails that are broken or loose. Make sure that handrails are as long as the stairways. Check any carpeting to make sure that it is firmly attached to the stairs. Fix any carpet that is loose or worn. Avoid having throw rugs at the top or bottom of the stairs. If you do have throw rugs, attach them to the floor with carpet tape. Make sure that you have a light switch at the top of the stairs and the bottom of the stairs. If you do not have them, ask someone to add them for you. What else can I do to help prevent falls? Wear shoes that: Do not have high heels. Have rubber bottoms. Are comfortable and fit you well. Are closed at the toe. Do not wear sandals. If you use a stepladder: Make sure that it is fully opened.  Do not climb a closed stepladder. Make sure that both sides of the stepladder are locked into place. Ask someone to hold it for you, if possible. Clearly mark and make sure that you can see: Any grab bars or handrails. First and last steps. Where the  edge of each step is. Use tools that help you move around (mobility aids) if they are needed. These include: Canes. Walkers. Scooters. Crutches. Turn on the lights when you go into a dark area. Replace any light bulbs as soon as they burn out. Set up your furniture so you have a clear path. Avoid moving your furniture around. If any of your floors are uneven, fix them. If there are any pets around you, be aware of where they are. Review your medicines with your doctor. Some medicines can make you feel dizzy. This can increase your chance of falling. Ask your doctor what other things that you can do to help prevent falls. This information is not intended to replace advice given to you by your health care provider. Make sure you discuss any questions you have with your health care provider. Document Released: 09/18/2009 Document Revised: 04/29/2016 Document Reviewed: 12/27/2014 Elsevier Interactive Patient Education  2017 [employer].

## 2023-04-29 ENCOUNTER — Other Ambulatory Visit: Payer: Self-pay | Admitting: Internal Medicine

## 2023-04-29 DIAGNOSIS — I10 Essential (primary) hypertension: Secondary | ICD-10-CM

## 2023-04-29 DIAGNOSIS — E782 Mixed hyperlipidemia: Secondary | ICD-10-CM

## 2023-04-29 NOTE — Telephone Encounter (Signed)
Requested Prescriptions  Pending Prescriptions Disp Refills   atorvastatin (LIPITOR) 40 MG tablet [Pharmacy Med Name: ATORVASTATIN 40 MG TABLET] 90 tablet 1    Sig: TAKE 1 TABLET BY MOUTH EVERY DAY AT 6PM     Cardiovascular:  Antilipid - Statins Failed - 04/29/2023  2:46 AM      Failed - Lipid Panel in normal range within the last 12 months    Cholesterol  Date Value Ref Range Status  04/18/2023 169 <200 mg/dL Final   LDL Cholesterol (Calc)  Date Value Ref Range Status  04/18/2023 78 mg/dL (calc) Final    Comment:    Reference range: <100 . Desirable range <100 mg/dL for primary prevention;   <70 mg/dL for patients with CHD or diabetic patients  with > or = 2 CHD risk factors. Marland Kitchen LDL-C is now calculated using the Martin-Hopkins  calculation, which is a validated novel method providing  better accuracy than the Friedewald equation in the  estimation of LDL-C.  Horald Pollen et al. Lenox Ahr. 5784;696(29): 2061-2068  (http://education.Quest[employer]ostics.com/faq/FAQ164)    Direct LDL  Date Value Ref Range Status  01/14/2014 97.1 mg/dL Final    Comment:    Optimal:  <100 mg/dLNear or Above Optimal:  100-129 mg/dLBorderline High:  130-159 mg/dLHigh:  160-189 mg/dLVery High:  >190 mg/dL   HDL  Date Value Ref Range Status  04/18/2023 73 > OR = 50 mg/dL Final   Triglycerides  Date Value Ref Range Status  04/18/2023 98 <150 mg/dL Final         Passed - Patient is not pregnant      Passed - Valid encounter within last 12 months    Recent Outpatient Visits           1 week ago Type 2 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 disease, without long-term current use of insulin (HCC)   [place] [facility] [place], Salvadore Oxford, NP   5 months ago Encounter for general adult medical examination with abnormal findings   [place] [facility] [place], [place] W, NP   12 months ago Type 2 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 disease, without  long-term current use of insulin [facility])   [place] [facility] [place], [place], NP   1 year ago Postmenopausal vaginal bleeding   [place] [facility] [place], Salvadore Oxford, NP   1 year ago Encounter for general adult medical examination with abnormal findings   [place] [facility] [place], Salvadore Oxford, NP       Future Appointments             In 6 months Baity, Salvadore Oxford, NP  [facility]             lisinopril (ZESTRIL) 40 MG tablet [Pharmacy Med Name: LISINOPRIL 40 MG TABLET] 90 tablet 1    Sig: TAKE 1 TABLET BY MOUTH EVERY DAY     Cardiovascular:  ACE Inhibitors Failed - 04/29/2023  2:46 AM      Failed - Cr in normal range and within 180 days    Creat  Date Value Ref Range Status  04/18/2023 1.14 (H) 0.50 - 1.05 mg/dL Final   Creatinine,U  Date Value Ref Range Status  07/25/2019 153.6 mg/dL Final   Creatinine, Urine  Date Value Ref Range Status  04/18/2023 168 20 - 275 mg/dL Final         Passed -  K in normal range and within 180 days    Potassium  Date Value Ref Range Status  04/18/2023 4.5 3.5 - 5.3 mmol/L Final         Passed - Patient is not pregnant      Passed - Last BP in normal range    BP Readings from Last 1 Encounters:  04/18/23 116/82         Passed - Valid encounter within last 6 months    Recent Outpatient Visits           1 week ago Type 2 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 disease, without long-term current use of insulin (HCC)   [place] [facility] [place], Salvadore Oxford, NP   5 months ago Encounter for general adult medical examination with abnormal findings   [place] [facility] [place], [place] W, NP   12 months ago Type 2 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 disease, without long-term current use of insulin [facility])   [place] [facility] [place], Salvadore Oxford, NP   1 year ago Postmenopausal  vaginal bleeding   [place] [facility] [place], Salvadore Oxford, NP   1 year ago Encounter for general adult medical examination with abnormal findings   [place] [facility] [place], Salvadore Oxford, NP       Future Appointments             In 6 months Baity, Salvadore Oxford, NP [place] [facility]

## 2023-05-16 ENCOUNTER — Telehealth (HOSPITAL_BASED_OUTPATIENT_CLINIC_OR_DEPARTMENT_OTHER): Payer: Medicare HMO | Admitting: Psychiatry

## 2023-05-16 ENCOUNTER — Encounter (HOSPITAL_COMMUNITY): Payer: Self-pay | Admitting: Psychiatry

## 2023-05-16 VITALS — Wt 252.0 lb

## 2023-05-16 DIAGNOSIS — F200 Paranoid schizophrenia: Secondary | ICD-10-CM

## 2023-05-16 DIAGNOSIS — F411 Generalized anxiety disorder: Secondary | ICD-10-CM

## 2023-05-16 MED ORDER — HALOPERIDOL 5 MG PO TABS: ORAL_TABLET | ORAL | 1 refills | Status: DC

## 2023-05-16 MED ORDER — SERTRALINE HCL 50 MG PO TABS
50.0000 mg | ORAL_TABLET | Freq: Every day | ORAL | 1 refills | Status: DC
Start: 2023-05-16 — End: 2023-11-14

## 2023-05-16 NOTE — Progress Notes (Signed)
[facility] MD Virtual Progress Note   Patient Location: Home Provider Location: Home Office  I connect with patient by video and verified that I am speaking with correct person by using two identifiers. I discussed the limitations of evaluation and management by telemedicine and the availability of in person appointments. I also discussed with the patient that there may be a patient responsible charge related to this service. The patient expressed understanding and agreed to proceed.  Maria Franklin [id number] 62 y.o.  05/16/2023 2:03 PM  History of Present Illness:  Patient is evaluated by video session.  She is compliant with Zoloft and haloperidol 2.5 mg.  He reported things are going well.  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  Husband is also working.  Supportive.  Patient does not feel paranoia or any nervousness around people.  She usually work during the day.  She denies any hallucination, anger, irritability or mood swings.  She denies any suicidal thoughts or homicidal thoughts.  Her appetite is okay.  Recently she had a blood work and her labs are stable.  She is sad because her 80 year old daughter did not find the work since Christmas.  She is trying to help her daughter as much she can.  She like to keep the current medication.  She denies any panic attack, crying spells or any feeling of hopelessness or worthlessness.  Her energy level is okay.  Past Psychiatric History: H/O paranoia and delusions. H/O overdose and inpatient at [facility]. H/O overdose and inpatient at [facility]. Readmitted in 4 months due to noncompliant with medication. Last admission at [facility] in Nov 2020. Abilify worked but could not afford. No h/o abuse.      Outpatient Encounter Medications as of 05/16/2023  Medication Sig   ADVAIR DISKUS 250-50 MCG/ACT AEPB Inhale 1 puff into the lungs in the morning and at bedtime.   albuterol (PROVENTIL) (2.5 MG/3ML)  0.083% nebulizer solution Take 3 mLs (2.5 mg total) by nebulization every 4 (four) hours as needed for wheezing or shortness of breath.   albuterol (VENTOLIN HFA) 108 (90 Base) MCG/ACT inhaler INHALE 1 TO 2 PUFFS INTO THE LUNGS EVERY 6 HOURS AS NEEDED WHEEZING/SHORTNESS OF BREATH   amLODipine (NORVASC) 10 MG tablet TAKE 1 TABLET BY MOUTH EVERY DAY   aspirin EC 81 MG EC tablet Take 1 tablet (81 mg total) by mouth daily.   atorvastatin (LIPITOR) 40 MG tablet TAKE 1 TABLET BY MOUTH EVERY DAY AT 6PM   b complex vitamins capsule Take 1 capsule by mouth daily.   Boswellia-Glucosamine-Vit D (OSTEO BI-FLEX ONE PER DAY PO) Take by mouth.   carvedilol (COREG) 12.5 MG tablet TAKE 1 TABLET BY MOUTH TWICE A DAY WITH MEALS   cetirizine (ZYRTEC) 10 MG tablet Take 1 tablet (10 mg total) by mouth daily.   Ferrous Sulfate (IRON) 325 (65 Fe) MG TABS Take 1 tablet by mouth every morning.   fluticasone (FLONASE) 50 MCG/ACT nasal spray SPRAY 2 SPRAYS INTO EACH NOSTRIL EVERY DAY   haloperidol (HALDOL) 5 MG tablet TAKE 0.5 TABLETS (2.5 MG TOTAL) BY MOUTH AT BEDTIME. TAKE 1/2 TABLET AT BED TIME   hydrALAZINE (APRESOLINE) 25 MG tablet TAKE 1/2 TABLET BY MOUTH EVERY MORNING AND AT BEDTIME   levothyroxine (SYNTHROID) 50 MCG tablet TAKE 1 TABLET BY MOUTH EVERY DAY BEFORE BREAKFAST   lisinopril (ZESTRIL) 40 MG tablet TAKE 1 TABLET BY MOUTH EVERY DAY   Multiple Vitamins-Minerals (MULTIVITAMIN WITH MINERALS)  tablet Take 1 tablet by mouth every evening.   omeprazole (PRILOSEC) 40 MG capsule TAKE 1 CAPSULE BY MOUTH EVERY DAY   sertraline (ZOLOFT) 50 MG tablet Take 1 tablet (50 mg total) by mouth daily.   No facility-administered encounter medications on file as of 05/16/2023.    Recent Results (from the past 2160 hour(s))  CBC     Status: Abnormal   Collection Time: 04/18/23  9:17 AM  Result Value Ref Range   WBC 6.4 3.8 - 10.8 Thousand/uL   RBC 3.95 3.80 - 5.10 Million/uL   Hemoglobin 10.9 (L) 11.7 - 15.5 g/dL   HCT 21.3  (L) 08.6 - 45.0 %   MCV 87.3 80.0 - 100.0 fL   MCH 27.6 27.0 - 33.0 pg   MCHC 31.6 (L) 32.0 - 36.0 g/dL   RDW 57.8 46.9 - 62.9 %   Platelets 232 140 - 400 Thousand/uL   MPV 9.7 7.5 - 12.5 fL  COMPLETE METABOLIC PANEL WITH GFR     Status: Abnormal   Collection Time: 04/18/23  9:17 AM  Result Value Ref Range   Glucose, Bld 111 (H) 65 - 99 mg/dL    Comment: .            Fasting reference interval . For someone without known diabetes, a glucose value between 100 and 125 mg/dL is consistent with prediabetes and should be confirmed with a follow-up test. .    BUN 15 7 - 25 mg/dL   Creat 5.28 (H) 4.13 - 1.05 mg/dL   eGFR 55 (L) > OR = 60 mL/min/1.42m2   BUN/Creatinine Ratio 13 6 - 22 (calc)   Sodium 141 135 - 146 mmol/L   Potassium 4.5 3.5 - 5.3 mmol/L   Chloride 106 98 - 110 mmol/L   CO2 28 20 - 32 mmol/L   Calcium 9.4 8.6 - 10.4 mg/dL   Total Protein 6.8 6.1 - 8.1 g/dL   Albumin 4.0 3.6 - 5.1 g/dL   Globulin 2.8 1.9 - 3.7 g/dL (calc)   AG Ratio 1.4 1.0 - 2.5 (calc)   Total Bilirubin 0.4 0.2 - 1.2 mg/dL   Alkaline phosphatase (APISO) 85 37 - 153 U/L   AST 16 10 - 35 U/L   ALT 18 6 - 29 U/L  TSH     Status: None   Collection Time: 04/18/23  9:17 AM  Result Value Ref Range   TSH 2.74 0.40 - 4.50 mIU/L  T4, free     Status: None   Collection Time: 04/18/23  9:17 AM  Result Value Ref Range   Free T4 0.9 0.8 - 1.8 ng/dL  Lipid panel     Status: None   Collection Time: 04/18/23  9:17 AM  Result Value Ref Range   Cholesterol 169 <200 mg/dL   HDL 73 > OR = 50 mg/dL   Triglycerides 98 <244 mg/dL   LDL Cholesterol (Calc) 78 mg/dL (calc)    Comment: Reference range: <100 . Desirable range <100 mg/dL for primary prevention;   <70 mg/dL for patients with CHD or diabetic patients  with > or = 2 CHD risk factors. Marland Kitchen LDL-C is now calculated using the Martin-Hopkins  calculation, which is a validated novel method providing  better accuracy than the Friedewald equation in the   estimation of LDL-C.  Horald Pollen et al. Lenox Ahr. 0102;725(36): 2061-2068  (http://education.[facility].com/faq/FAQ164)    Total CHOL/HDL Ratio 2.3 <5.0 (calc)   Non-HDL Cholesterol (Calc) 96 <644 mg/dL (calc)  Comment: For patients with diabetes plus 1 major ASCVD risk  factor, treating to a non-HDL-C goal of <100 mg/dL  (LDL-C of <13 mg/dL) is considered a therapeutic  option.   Microalbumin / creatinine urine ratio     Status: None   Collection Time: 04/18/23  9:17 AM  Result Value Ref Range   Creatinine, Urine 168 20 - 275 mg/dL   Microalb, Ur 0.9 mg/dL    Comment: Reference Range Not established    Microalb Creat Ratio 5 <30 mg/g creat    Comment: . The ADA defines abnormalities in albumin excretion as follows: Marland Kitchen Albuminuria Category        Result (mg/g creatinine) . Normal to Mildly increased   <30 Moderately increased         30-299  Severely increased           > OR = 300 . The ADA recommends that at least two of three specimens collected within a 3-6 month period be abnormal before considering a patient to be within a diagnostic category.   POCT glycosylated hemoglobin (Hb A1C)     Status: Abnormal   Collection Time: 04/18/23  9:59 AM  Result Value Ref Range   Hemoglobin A1C     HbA1c POC (<> result, manual entry)     HbA1c, POC (prediabetic range)     HbA1c, POC (controlled diabetic range) 6.2 0.0 - 7.0 %     Psychiatric Specialty Exam: Physical Exam  Review of Systems  Weight 252 lb (114.3 kg), last menstrual period 02/02/2017.There is no height or weight on file to calculate BMI.  General Appearance: Casual  Eye Contact:  Good  Speech:  Normal Rate  Volume:  Normal  Mood:  Euthymic  Affect:  Appropriate  Thought Process:  Goal Directed  Orientation:  Full (Time, Place, and Person)  Thought Content:  WDL  Suicidal Thoughts:  No  Homicidal Thoughts:  No  Memory:  Immediate;   Good Recent;   Good Remote;   Good  Judgement:  Good  Insight:   Good  Psychomotor Activity:  Normal  Concentration:  Concentration: Good and Attention Span: Good  Recall:  Good  Fund of Knowledge:  Good  Language:  Good  Akathisia:  No  Handed:  Right  AIMS (if indicated):     Assets:  Communication Skills Desire for Improvement Housing Social Support Talents/Skills Transportation  ADL's:  Intact  Cognition:  WNL  Sleep:  ok     Assessment/Plan: Paranoid schizophrenia, chronic condition (HCC) - Plan: haloperidol (HALDOL) 5 MG tablet, sertraline (ZOLOFT) 50 MG tablet  GAD (generalized anxiety disorder) - Plan: sertraline (ZOLOFT) 50 MG tablet  Patient is stable on current medication.  Continue Haldol 2.5 mg at bedtime and Zoloft 50 mg daily.  Patient like to have a follow-up in 6 months due to higher co-pay.  She has been stable and I recommend to call us back if she has any question, concern or if she feels worsening of the symptoms and we will follow-up in 6 months.   Follow Up Instructions:     I discussed the assessment and treatment plan with the patient. The patient was provided an opportunity to ask questions and all were answered. The patient agreed with the plan and demonstrated an understanding of the instructions.   The patient was advised to call back or seek an in-person evaluation if the symptoms worsen or if the condition fails to improve as anticipated.    Collaboration  of Care: Other provider involved in patient's care AEB notes are available in epic to review.  Patient/Guardian was advised Release of Information must be obtained prior to any record release in order to collaborate their care with an outside provider. Patient/Guardian was advised if they have not already done so to contact the registration department to sign all necessary forms in order for [place] to release information regarding their care.   Consent: Patient/Guardian gives verbal consent for treatment and assignment of benefits for services provided during  this visit. Patient/Guardian expressed understanding and agreed to proceed.     I provided 20 minutes of non face to face time during this encounter.  Note: This document was prepared by [employer] voice dictation technology and any errors that results from this process are unintentional.    Cleotis Nipper, MD 05/16/2023

## 2023-06-07 ENCOUNTER — Other Ambulatory Visit: Payer: Self-pay

## 2023-06-07 DIAGNOSIS — E039 Hypothyroidism, unspecified: Secondary | ICD-10-CM

## 2023-06-07 MED ORDER — LEVOTHYROXINE SODIUM 50 MCG PO TABS
50.0000 ug | ORAL_TABLET | Freq: Every day | ORAL | 1 refills | Status: DC
Start: 2023-06-07 — End: 2023-12-09

## 2023-06-09 ENCOUNTER — Other Ambulatory Visit: Payer: Self-pay | Admitting: Internal Medicine

## 2023-06-09 DIAGNOSIS — I10 Essential (primary) hypertension: Secondary | ICD-10-CM

## 2023-06-10 NOTE — Telephone Encounter (Signed)
Requested Prescriptions  Pending Prescriptions Disp Refills   carvedilol (COREG) 12.5 MG tablet [Pharmacy Med Name: CARVEDILOL 12.5 MG TABLET] 180 tablet 0    Sig: TAKE 1 TABLET BY MOUTH TWICE A DAY WITH FOOD     Cardiovascular: Beta Blockers 3 Failed - 06/09/2023  2:43 AM      Failed - Cr in normal range and within 360 days    Creat  Date Value Ref Range Status  04/18/2023 1.14 (H) 0.50 - 1.05 mg/dL Final   Creatinine,U  Date Value Ref Range Status  07/25/2019 153.6 mg/dL Final   Creatinine, Urine  Date Value Ref Range Status  04/18/2023 168 20 - 275 mg/dL Final         Passed - AST in normal range and within 360 days    AST  Date Value Ref Range Status  04/18/2023 16 10 - 35 U/L Final         Passed - ALT in normal range and within 360 days    ALT  Date Value Ref Range Status  04/18/2023 18 6 - 29 U/L Final         Passed - Last BP in normal range    BP Readings from Last 1 Encounters:  04/18/23 116/82         Passed - Last Heart Rate in normal range    Pulse Readings from Last 1 Encounters:  04/18/23 70         Passed - Valid encounter within last 6 months    Recent Outpatient Visits           1 month ago Type 2 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 disease, without long-term current use of insulin (HCC)   [facility] [place], Salvadore Oxford, NP   7 months ago Encounter for general adult medical examination with abnormal findings   [place] [facility] [place], Salvadore Oxford, NP   1 year ago Type 2 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 disease, without long-term current use of insulin (HCC)   [place] [facility] [place], [place] W, NP   1 year ago Postmenopausal vaginal bleeding   [place] [facility] [place], [place] W, NP   1 year ago Encounter for general adult medical examination with abnormal findings   [place] [facility] [place], Salvadore Oxford, NP        Future Appointments             In 5 months Baity, Salvadore Oxford, NP [place] [facility]             hydrALAZINE (APRESOLINE) 25 MG tablet [Pharmacy Med Name: HYDRALAZINE 25 MG TABLET] 90 tablet 0    Sig: TAKE 1/2 TABLET BY MOUTH EVERY MORNING AND AT BEDTIME     Cardiovascular:  Vasodilators Failed - 06/09/2023  2:43 AM      Failed - HCT in normal range and within 360 days    HCT  Date Value Ref Range Status  04/18/2023 34.5 (L) 35.0 - 45.0 % Final         Failed - HGB in normal range and within 360 days    Hemoglobin  Date Value Ref Range Status  04/18/2023 10.9 (L) 11.7 - 15.5 g/dL Final         Failed - ANA Screen, Ifa, Serum in normal range and within 360 days    Anti  Nuclear Antibody (ANA)  Date Value Ref Range Status  07/22/2021 NEGATIVE NEGATIVE Final    Comment:    ANA IFA is a first line screen for detecting the presence of up to approximately 150 autoantibodies in various autoimmune diseases. A negative ANA IFA result suggests an ANA-associated autoimmune disease is not present at this time, but is not definitive. If there is high clinical suspicion for Sjogren's syndrome, testing for anti-SS-A/Ro antibody should be considered. Anti-Jo-1 antibody should be considered for clinically suspected inflammatory myopathies. . AC-0: Negative . International Consensus on ANA Patterns (SeverTies.uy) . For additional information, please refer to http://education.[facility].com/faq/FAQ177 (This link is being provided for informational/ educational purposes only.) .          Passed - RBC in normal range and within 360 days    RBC  Date Value Ref Range Status  04/18/2023 3.95 3.80 - 5.10 Million/uL Final         Passed - WBC in normal range and within 360 days    WBC  Date Value Ref Range Status  04/18/2023 6.4 3.8 - 10.8 Thousand/uL Final         Passed - PLT in normal range and within 360 days     Platelets  Date Value Ref Range Status  04/18/2023 232 140 - 400 Thousand/uL Final         Passed - Last BP in normal range    BP Readings from Last 1 Encounters:  04/18/23 116/82         Passed - Valid encounter within last 12 months    Recent Outpatient Visits           1 month ago Type 2 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 disease, without long-term current use of insulin (HCC)   [facility] [place], Salvadore Oxford, NP   7 months ago Encounter for general adult medical examination with abnormal findings   [facility] [place], [place] W, NP   1 year ago Type 2 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 disease, without long-term current use of insulin [facility])   [place] [facility] [place], [place], NP   1 year ago Postmenopausal vaginal bleeding   [facility] [place], Salvadore Oxford, NP   1 year ago Encounter for general adult medical examination with abnormal findings   [place] [facility] [place], Salvadore Oxford, NP       Future Appointments             In 5 months Baity, Salvadore Oxford, NP [place] [facility]             omeprazole (PRILOSEC) 40 MG capsule [Pharmacy Med Name: OMEPRAZOLE DR 40 MG CAPSULE] 90 capsule 0    Sig: TAKE 1 CAPSULE BY MOUTH EVERY DAY     Gastroenterology: Proton Pump Inhibitors Passed - 06/09/2023  2:43 AM      Passed - Valid encounter within last 12 months    Recent Outpatient Visits           1 month ago Type 2 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 disease, without long-term current use of insulin [facility])   [place] [facility] [place], Salvadore Oxford, NP   7 months ago Encounter for general adult medical examination with abnormal findings   [place] [facility] [place], Salvadore Oxford, NP  1 year ago Type 2 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 disease, without long-term  current use of insulin [facility])   [place] [facility] [place], [place], NP   1 year ago Postmenopausal vaginal bleeding   [place] [facility] [place], Salvadore Oxford, NP   1 year ago Encounter for general adult medical examination with abnormal findings   [place] [facility] [place], Salvadore Oxford, NP       Future Appointments             In 5 months Baity, Salvadore Oxford, NP [place] [facility]

## 2023-07-09 ENCOUNTER — Other Ambulatory Visit: Payer: Self-pay | Admitting: Internal Medicine

## 2023-07-09 DIAGNOSIS — I10 Essential (primary) hypertension: Secondary | ICD-10-CM

## 2023-07-11 NOTE — Telephone Encounter (Signed)
Requested Prescriptions  Pending Prescriptions Disp Refills   amLODipine (NORVASC) 10 MG tablet [Pharmacy Med Name: AMLODIPINE BESYLATE 10 MG TAB] 90 tablet 1    Sig: TAKE 1 TABLET BY MOUTH EVERY DAY     Cardiovascular: Calcium Channel Blockers 2 Passed - 07/09/2023  8:50 AM      Passed - Last BP in normal range    BP Readings from Last 1 Encounters:  04/18/23 116/82         Passed - Last Heart Rate in normal range    Pulse Readings from Last 1 Encounters:  04/18/23 70         Passed - Valid encounter within last 6 months    Recent Outpatient Visits           2 months ago Type 2 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 disease, without long-term current use of insulin (HCC)   [place] [facility] [place], Salvadore Oxford, NP   8 months ago Encounter for general adult medical examination with abnormal findings   [place] [facility] [place], [place] W, NP   1 year ago Type 2 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 disease, without long-term current use of insulin [facility])   [place] [facility] [place], Salvadore Oxford, NP   1 year ago Postmenopausal vaginal bleeding   [place] [facility] [place], Salvadore Oxford, NP   1 year ago Encounter for general adult medical examination with abnormal findings   [place] [facility] [place], Salvadore Oxford, NP       Future Appointments             In 3 months Baity, Salvadore Oxford, NP [place] [facility]

## 2023-09-01 DIAGNOSIS — Z809 Family history of malignant neoplasm, unspecified: Secondary | ICD-10-CM | POA: Diagnosis not present

## 2023-09-01 DIAGNOSIS — E785 Hyperlipidemia, unspecified: Secondary | ICD-10-CM | POA: Diagnosis not present

## 2023-09-01 DIAGNOSIS — K219 Gastro-esophageal reflux disease without esophagitis: Secondary | ICD-10-CM | POA: Diagnosis not present

## 2023-09-01 DIAGNOSIS — Z8249 Family history of ischemic heart disease and other diseases of the circulatory system: Secondary | ICD-10-CM | POA: Diagnosis not present

## 2023-09-01 DIAGNOSIS — I129 Hypertensive chronic kidney disease with stage 1 through stage 4 chronic kidney disease, or unspecified chronic kidney disease: Secondary | ICD-10-CM | POA: Diagnosis not present

## 2023-09-01 DIAGNOSIS — N1831 Chronic kidney disease, stage 3a: Secondary | ICD-10-CM | POA: Diagnosis not present

## 2023-09-01 DIAGNOSIS — J45909 Unspecified asthma, uncomplicated: Secondary | ICD-10-CM | POA: Diagnosis not present

## 2023-09-01 DIAGNOSIS — Z87891 Personal history of nicotine dependence: Secondary | ICD-10-CM | POA: Diagnosis not present

## 2023-09-01 DIAGNOSIS — Z79899 Other long term (current) drug therapy: Secondary | ICD-10-CM | POA: Diagnosis not present

## 2023-09-01 DIAGNOSIS — E039 Hypothyroidism, unspecified: Secondary | ICD-10-CM | POA: Diagnosis not present

## 2023-09-01 DIAGNOSIS — F209 Schizophrenia, unspecified: Secondary | ICD-10-CM | POA: Diagnosis not present

## 2023-09-04 ENCOUNTER — Other Ambulatory Visit: Payer: Self-pay | Admitting: Internal Medicine

## 2023-09-04 DIAGNOSIS — I10 Essential (primary) hypertension: Secondary | ICD-10-CM

## 2023-09-05 NOTE — Telephone Encounter (Signed)
Requested Prescriptions  Pending Prescriptions Disp Refills   hydrALAZINE (APRESOLINE) 25 MG tablet [Pharmacy Med Name: HYDRALAZINE 25 MG TABLET] 90 tablet 0    Sig: TAKE 1/2 TABLET BY MOUTH EVERY MORNING AND AT BEDTIME     Cardiovascular:  Vasodilators Failed - 09/04/2023  8:31 AM      Failed - HCT in normal range and within 360 days    HCT  Date Value Ref Range Status  04/18/2023 34.5 (L) 35.0 - 45.0 % Final         Failed - HGB in normal range and within 360 days    Hemoglobin  Date Value Ref Range Status  04/18/2023 10.9 (L) 11.7 - 15.5 g/dL Final         Failed - ANA Screen, Ifa, Serum in normal range and within 360 days    Anti Nuclear Antibody (ANA)  Date Value Ref Range Status  07/22/2021 NEGATIVE NEGATIVE Final    Comment:    ANA IFA is a first line screen for detecting the presence of up to approximately 150 autoantibodies in various autoimmune diseases. A negative ANA IFA result suggests an ANA-associated autoimmune disease is not present at this time, but is not definitive. If there is high clinical suspicion for Sjogren's syndrome, testing for anti-SS-A/Ro antibody should be considered. Anti-Jo-1 antibody should be considered for clinically suspected inflammatory myopathies. . AC-0: Negative . International Consensus on ANA Patterns (SeverTies.uy) . For additional information, please refer to http://education.[facility].com/faq/FAQ177 (This link is being provided for informational/ educational purposes only.) .          Passed - RBC in normal range and within 360 days    RBC  Date Value Ref Range Status  04/18/2023 3.95 3.80 - 5.10 Million/uL Final         Passed - WBC in normal range and within 360 days    WBC  Date Value Ref Range Status  04/18/2023 6.4 3.8 - 10.8 Thousand/uL Final         Passed - PLT in normal range and within 360 days    Platelets  Date Value Ref Range Status  04/18/2023 232 140 - 400  Thousand/uL Final         Passed - Last BP in normal range    BP Readings from Last 1 Encounters:  04/18/23 116/82         Passed - Valid encounter within last 12 months    Recent Outpatient Visits           4 months ago Type 2 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 disease, without long-term current use of insulin [facility])   [place] [facility] [place], Salvadore Oxford, NP   10 months ago Encounter for general adult medical examination with abnormal findings   [place] [facility] [place], [place], NP   1 year ago Type 2 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 disease, without long-term current use of insulin [facility])   [facility] [place], Salvadore Oxford, NP   1 year ago Postmenopausal vaginal bleeding   [place] [facility] [place], Salvadore Oxford, NP   1 year ago Encounter for general adult medical examination with abnormal findings   [place] [facility] [place], Salvadore Oxford, NP       Future Appointments             In 2 months [place], Salvadore Oxford, NP [facility]

## 2023-09-08 ENCOUNTER — Other Ambulatory Visit: Payer: Self-pay | Admitting: Internal Medicine

## 2023-09-08 NOTE — Telephone Encounter (Signed)
Requested Prescriptions  Pending Prescriptions Disp Refills   carvedilol (COREG) 12.5 MG tablet [Pharmacy Med Name: CARVEDILOL 12.5 MG TABLET] 180 tablet 0    Sig: TAKE 1 TABLET BY MOUTH TWICE A DAY WITH FOOD     Cardiovascular: Beta Blockers 3 Failed - 09/08/2023  1:33 AM      Failed - Cr in normal range and within 360 days    Creat  Date Value Ref Range Status  04/18/2023 1.14 (H) 0.50 - 1.05 mg/dL Final   Creatinine,U  Date Value Ref Range Status  07/25/2019 153.6 mg/dL Final   Creatinine, Urine  Date Value Ref Range Status  04/18/2023 168 20 - 275 mg/dL Final         Passed - AST in normal range and within 360 days    AST  Date Value Ref Range Status  04/18/2023 16 10 - 35 U/L Final         Passed - ALT in normal range and within 360 days    ALT  Date Value Ref Range Status  04/18/2023 18 6 - 29 U/L Final         Passed - Last BP in normal range    BP Readings from Last 1 Encounters:  04/18/23 116/82         Passed - Last Heart Rate in normal range    Pulse Readings from Last 1 Encounters:  04/18/23 70         Passed - Valid encounter within last 6 months    Recent Outpatient Visits           4 months ago Type 2 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 disease, without long-term current use of insulin (HCC)   [place] [facility] [place], Salvadore Oxford, NP   10 months ago Encounter for general adult medical examination with abnormal findings   [place] [facility] [place], [place] W, NP   1 year ago Type 2 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 disease, without long-term current use of insulin [facility])   [place] [facility] [place], [place], NP   1 year ago Postmenopausal vaginal bleeding   [place] [facility] [place], Salvadore Oxford, NP   1 year ago Encounter for general adult medical examination with abnormal findings   [place] [facility] [place], Salvadore Oxford, NP        Future Appointments             In 2 months Baity, Salvadore Oxford, NP [place] [facility]             omeprazole (PRILOSEC) 40 MG capsule [Pharmacy Med Name: OMEPRAZOLE DR 40 MG CAPSULE] 90 capsule 0    Sig: TAKE 1 CAPSULE BY MOUTH EVERY DAY     Gastroenterology: Proton Pump Inhibitors Passed - 09/08/2023  1:33 AM      Passed - Valid encounter within last 12 months    Recent Outpatient Visits           4 months ago Type 2 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 disease, without long-term current use of insulin [facility])   [place] [facility] [place], Salvadore Oxford, NP   10 months ago Encounter for general adult medical examination with abnormal findings    [facility] [place], [place], NP   1 year ago Type 2 diabetes mellitus with  stage 3a chronic kidney disease, without long-term current use of insulin [facility])   [place] [facility] [place], Salvadore Oxford, NP   1 year ago Postmenopausal vaginal bleeding   [place] [facility] [place], Salvadore Oxford, NP   1 year ago Encounter for general adult medical examination with abnormal findings   [place] [facility] [place], Salvadore Oxford, NP       Future Appointments             In 2 months Baity, Salvadore Oxford, NP [place] [facility], [facility]

## 2023-10-14 ENCOUNTER — Other Ambulatory Visit: Payer: Self-pay | Admitting: Internal Medicine

## 2023-10-14 DIAGNOSIS — I10 Essential (primary) hypertension: Secondary | ICD-10-CM

## 2023-10-14 DIAGNOSIS — E782 Mixed hyperlipidemia: Secondary | ICD-10-CM

## 2023-10-14 NOTE — Telephone Encounter (Signed)
Requested Prescriptions  Pending Prescriptions Disp Refills   atorvastatin (LIPITOR) 40 MG tablet [Pharmacy Med Name: ATORVASTATIN 40 MG TABLET] 90 tablet 1    Sig: TAKE 1 TABLET BY MOUTH EVERY DAY AT 6PM     Cardiovascular:  Antilipid - Statins Failed - 10/14/2023  1:44 AM      Failed - Lipid Panel in normal range within the last 12 months    Cholesterol  Date Value Ref Range Status  04/18/2023 169 <200 mg/dL Final   LDL Cholesterol (Calc)  Date Value Ref Range Status  04/18/2023 78 mg/dL (calc) Final    Comment:    Reference range: <100 . Desirable range <100 mg/dL for primary prevention;   <70 mg/dL for patients with CHD or diabetic patients  with > or = 2 CHD risk factors. Marland Kitchen LDL-C is now calculated using the Martin-Hopkins  calculation, which is a validated novel method providing  better accuracy than the Friedewald equation in the  estimation of LDL-C.  Horald Pollen et al. Lenox Ahr. 6962;952(84): 2061-2068  (http://education.[facility].com/faq/FAQ164)    Direct LDL  Date Value Ref Range Status  01/14/2014 97.1 mg/dL Final    Comment:    Optimal:  <100 mg/dLNear or Above Optimal:  100-129 mg/dLBorderline High:  130-159 mg/dLHigh:  160-189 mg/dLVery High:  >190 mg/dL   HDL  Date Value Ref Range Status  04/18/2023 73 > OR = 50 mg/dL Final   Triglycerides  Date Value Ref Range Status  04/18/2023 98 <150 mg/dL Final         Passed - Patient is not pregnant      Passed - Valid encounter within last 12 months    Recent Outpatient Visits           5 months ago Type 2 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 disease, without long-term current use of insulin (HCC)   [place] [facility] [place], Salvadore Oxford, NP   11 months ago Encounter for general adult medical examination with abnormal findings   [place] [facility] [place], [place], NP   1 year ago Type 2 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 disease, without  long-term current use of insulin [facility])   [place] [facility] [place], [place], NP   1 year ago Postmenopausal vaginal bleeding   [facility] [facility] [place], Salvadore Oxford, NP   1 year ago Encounter for general adult medical examination with abnormal findings   [place] [facility] [place], Salvadore Oxford, NP       Future Appointments             In 3 weeks Sampson Si, Salvadore Oxford, NP [place] [facility]             lisinopril (ZESTRIL) 40 MG tablet [Pharmacy Med Name: LISINOPRIL 40 MG TABLET] 90 tablet 0    Sig: TAKE 1 TABLET BY MOUTH EVERY DAY     Cardiovascular:  ACE Inhibitors Failed - 10/14/2023  1:44 AM      Failed - Cr in normal range and within 180 days    Creat  Date Value Ref Range Status  04/18/2023 1.14 (H) 0.50 - 1.05 mg/dL Final   Creatinine,U  Date Value Ref Range Status  07/25/2019 153.6 mg/dL Final   Creatinine, Urine  Date Value Ref Range Status  04/18/2023 168 20 - 275 mg/dL Final         Passed -  K in normal range and within 180 days    Potassium  Date Value Ref Range Status  04/18/2023 4.5 3.5 - 5.3 mmol/L Final         Passed - Patient is not pregnant      Passed - Last BP in normal range    BP Readings from Last 1 Encounters:  04/18/23 116/82         Passed - Valid encounter within last 6 months    Recent Outpatient Visits           5 months ago Type 2 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 disease, without long-term current use of insulin [facility])   [place] [facility] [place], Salvadore Oxford, NP   11 months ago Encounter for general adult medical examination with abnormal findings   [place] [facility] [place], [place], NP   1 year ago Type 2 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 disease, without long-term current use of insulin [facility])   [place] [facility] [place], Salvadore Oxford, NP   1 year ago Postmenopausal  vaginal bleeding   [place] [facility] [place], Salvadore Oxford, NP   1 year ago Encounter for general adult medical examination with abnormal findings   [place] [facility] [place], Salvadore Oxford, NP       Future Appointments             In 3 weeks Sampson Si, Salvadore Oxford, NP [place] [facility], [facility]

## 2023-10-19 DIAGNOSIS — M2142 Flat foot [pes planus] (acquired), left foot: Secondary | ICD-10-CM | POA: Diagnosis not present

## 2023-10-19 DIAGNOSIS — M792 Neuralgia and neuritis, unspecified: Secondary | ICD-10-CM | POA: Diagnosis not present

## 2023-10-19 DIAGNOSIS — M76822 Posterior tibial tendinitis, left leg: Secondary | ICD-10-CM | POA: Diagnosis not present

## 2023-11-07 ENCOUNTER — Encounter: Payer: Self-pay | Admitting: Internal Medicine

## 2023-11-07 ENCOUNTER — Ambulatory Visit (INDEPENDENT_AMBULATORY_CARE_PROVIDER_SITE_OTHER): Payer: Medicare HMO | Admitting: Internal Medicine

## 2023-11-07 VITALS — BP 116/78 | Ht 63.0 in | Wt 260.2 lb

## 2023-11-07 DIAGNOSIS — R42 Dizziness and giddiness: Secondary | ICD-10-CM | POA: Diagnosis not present

## 2023-11-07 DIAGNOSIS — Z0001 Encounter for general adult medical examination with abnormal findings: Secondary | ICD-10-CM | POA: Diagnosis not present

## 2023-11-07 DIAGNOSIS — Z23 Encounter for immunization: Secondary | ICD-10-CM | POA: Diagnosis not present

## 2023-11-07 DIAGNOSIS — N1831 Chronic kidney disease, stage 3a: Secondary | ICD-10-CM | POA: Diagnosis not present

## 2023-11-07 DIAGNOSIS — E039 Hypothyroidism, unspecified: Secondary | ICD-10-CM

## 2023-11-07 DIAGNOSIS — E1122 Type 2 diabetes mellitus with diabetic chronic kidney disease: Secondary | ICD-10-CM

## 2023-11-07 NOTE — Progress Notes (Signed)
Subjective:    Patient ID: Maria Franklin, female    DOB: 02-17-1961, 62 y.o.   MRN: [id number]  HPI  Patient presents to clinic today for her annual exam.  Flu: 09/2022 Tetanus: 04/2016 COVID: Pfizer x4 Pneumovax: 03/2019 [facility] Pap smear: 12/2021 Mammogram: 02/2023 Bone density: 05/2022 Colon screening: 12/2021 Vision screening: annually Dentist: biannually  Diet: She does eat meat. She consumes some fruits and veggies. She does eat some fried foods. She drinks mostly coffee, water. Exercise: None  Review of Systems     Past Medical History:  Diagnosis Date   Allergy    Anemia    Angio-edema    Asthma    ALL THE TIME   LAST FLARE UP 12/2018   Chest pain 08/2019   Chicken pox    Chronic kidney disease    Diabetes mellitus without complication (HCC)    GERD (gastroesophageal reflux disease)    Hyperlipidemia    Hypertension    Hyperthyroidism    Recurrent upper respiratory infection (URI)    Schizophrenia (HCC)    Sleep apnea    DX 2-3 YRS AGO.   Sleep apnea    Thyroid disease    Urticaria     Current Outpatient Medications  Medication Sig Dispense Refill   ADVAIR DISKUS 250-50 MCG/ACT AEPB Inhale 1 puff into the lungs in the morning and at bedtime. 60 each 5   albuterol (PROVENTIL) (2.5 MG/3ML) 0.083% nebulizer solution Take 3 mLs (2.5 mg total) by nebulization every 4 (four) hours as needed for wheezing or shortness of breath. 75 mL 1   albuterol (VENTOLIN HFA) 108 (90 Base) MCG/ACT inhaler INHALE 1 TO 2 PUFFS INTO THE LUNGS EVERY 6 HOURS AS NEEDED WHEEZING/SHORTNESS OF BREATH 18 each 1   amLODipine (NORVASC) 10 MG tablet TAKE 1 TABLET BY MOUTH EVERY DAY 90 tablet 1   aspirin EC 81 MG EC tablet Take 1 tablet (81 mg total) by mouth daily. 30 tablet 0   atorvastatin (LIPITOR) 40 MG tablet TAKE 1 TABLET BY MOUTH EVERY DAY AT 6PM 90 tablet 1   b complex vitamins capsule Take 1 capsule by mouth daily.     Boswellia-Glucosamine-Vit D  (OSTEO BI-FLEX ONE PER DAY PO) Take by mouth.     carvedilol (COREG) 12.5 MG tablet TAKE 1 TABLET BY MOUTH TWICE A DAY WITH FOOD 180 tablet 0   cetirizine (ZYRTEC) 10 MG tablet Take 1 tablet (10 mg total) by mouth daily. 30 tablet 5   Ferrous Sulfate (IRON) 325 (65 Fe) MG TABS Take 1 tablet by mouth every morning.     fluticasone (FLONASE) 50 MCG/ACT nasal spray SPRAY 2 SPRAYS INTO EACH NOSTRIL EVERY DAY 16 mL 5   haloperidol (HALDOL) 5 MG tablet TAKE 0.5 TABLETS (2.5 MG TOTAL) BY MOUTH AT BEDTIME. 45 tablet 1   hydrALAZINE (APRESOLINE) 25 MG tablet TAKE 1/2 TABLET BY MOUTH EVERY MORNING AND AT BEDTIME 90 tablet 0   levothyroxine (SYNTHROID) 50 MCG tablet Take 1 tablet (50 mcg total) by mouth daily before breakfast. 90 tablet 1   lisinopril (ZESTRIL) 40 MG tablet TAKE 1 TABLET BY MOUTH EVERY DAY 90 tablet 0   Multiple Vitamins-Minerals (MULTIVITAMIN WITH MINERALS) tablet Take 1 tablet by mouth every evening.     omeprazole (PRILOSEC) 40 MG capsule TAKE 1 CAPSULE BY MOUTH EVERY DAY 90 capsule 0   sertraline (ZOLOFT) 50 MG tablet Take 1 tablet (50 mg total) by mouth daily.  90 tablet 1   No current facility-administered medications for this visit.    Allergies  Allergen Reactions   Bee Pollen Cough   Pollen Extract Cough   Latex Itching, Rash and Other (See Comments)    Severe itching    Family History  Problem Relation Age of Onset   Mental illness Mother    Hypertension Mother    Schizophrenia Mother    Depression Mother    Heart disease Mother    Diabetes Mother    Cancer Father        LUNG   Hypertension Sister    Cancer Sister        BREAST   Breast cancer Sister    Hypertension Sister    Heart attack Sister    Hypertension Sister    Heart attack Sister    Mental illness Brother    Hypertension Brother    Schizophrenia Brother    Depression Brother    Hypertension Brother    Schizophrenia Brother    Hypertension Brother    Breast cancer Maternal Aunt     Schizophrenia Maternal Uncle    Heart disease Maternal Grandmother    Depression Maternal Grandmother    Schizophrenia Maternal Grandmother    Allergic rhinitis Neg Hx    Angioedema Neg Hx    Asthma Neg Hx    Atopy Neg Hx    Eczema Neg Hx    Immunodeficiency Neg Hx    Urticaria Neg Hx    Colon cancer Neg Hx    Colon polyps Neg Hx    Stomach cancer Neg Hx    Rectal cancer Neg Hx     Social History   Socioeconomic History   Marital status: Married    Spouse name: Rae Roam   Number of children: 4   Years of education: Associate Degree   Highest education level: Associate degree: academic program  Occupational History   Occupation: unemployed  Tobacco Use   Smoking status: Former    Current packs/day: 0.00    Average packs/day: 0.3 packs/day for 6.0 years (1.5 ttl pk-yrs)    Types: Cigarettes    Start date: 70    Quit date: 1987    Years since quitting: 37.9   Smokeless tobacco: Never   Tobacco comments:    QUIT IN HER EARLY 20'S  Vaping Use   Vaping status: Never Used  Substance and Sexual Activity   Alcohol use: No    Alcohol/week: 0.0 standard drinks of alcohol   Drug use: No   Sexual activity: Not Currently  Other Topics Concern   Not on file  Social History Narrative   Pt is R handed   Lives in single story home with her husband and father-in-law   Has 4 children   Associates Degree x2   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   Social Determinants of Health   Financial Resource Strain: Low Risk  (04/22/2023)   Overall Financial Resource Strain (CARDIA)    Difficulty of Paying Living Expenses: Not hard at all  Food Insecurity: No Food Insecurity (04/22/2023)   Hunger Vital Sign    Worried About Running Out of Food in the Last Year: Never true    Ran Out of Food in the Last Year: Never true  Transportation Needs: No Transportation Needs (04/22/2023)   PRAPARE - Administrator, Civil Service  (Medical): No    Lack of Transportation (Non-Medical): No  Physical Activity: Inactive (04/22/2023)   Exercise Vital Sign    Days of Exercise per Week: 0 days    Minutes of Exercise per Session: 0 min  Stress: No Stress Concern Present (04/22/2023)   [employer] of Occupational Health - Occupational Stress Questionnaire    Feeling of Stress : Not at all  Social Connections: Socially Integrated (04/22/2023)   Social Connection and Isolation Panel [NHANES]    Frequency of Communication with Friends and Family: Twice a week    Frequency of Social Gatherings with Friends and Family: Once a week    Attends Religious Services: More than 4 times per year    Active Member of [employer] or Organizations: Yes    Attends Engineer, structural: More than 4 times per year    Marital Status: Married  Catering manager Violence: Not At Risk (04/22/2023)   Humiliation, Afraid, Rape, and Kick questionnaire    Fear of Current or Ex-Partner: No    Emotionally Abused: No    Physically Abused: No    Sexually Abused: No     Constitutional: Denies fever, malaise, fatigue, headache or abrupt weight changes.  HEENT: Denies eye pain, eye redness, ear pain, ringing in the ears, wax buildup, runny nose, nasal congestion, bloody nose, or sore throat. Respiratory: Denies difficulty breathing, shortness of breath, cough or sputum production.   Cardiovascular: Denies chest pain, chest tightness, palpitations or swelling in the hands or feet.  Gastrointestinal: Denies abdominal pain, bloating, diarrhea, constipation, or blood in the stool.  GU: Denies urgency, frequency, pain with urination, burning sensation, blood in urine, odor or discharge. Musculoskeletal: Denies decrease in range of motion, difficulty with gait, muscle pain or joint pain and swelling.  Skin: Denies redness, rashes, lesions or ulcercations.  Neurological: Pt reports intermittent dizziness with standing. Denies difficulty with memory,  difficulty with speech or problems with balance and coordination.  Psych: Patient reports anxiety.  Denies depression, SI/HI.  No other specific complaints in a complete review of systems (except as listed in HPI above).  Objective:   Physical Exam BP 116/78   Ht 5\' 3"  (1.6 m)   Wt 260 lb 3.2 oz (118 kg)   LMP 02/02/2017 (Approximate)   BMI 46.09 kg/m    Wt Readings from Last 3 Encounters:  04/22/23 252 lb (114.3 kg)  04/18/23 252 lb (114.3 kg)  11/04/22 250 lb (113.4 kg)    General: Appears her stated age, obese, in NAD. Skin: Warm, dry and intact. No ulcerations noted. HEENT: Head: normal shape and size; Eyes: sclera white, no icterus, conjunctiva pink, PERRLA and EOMs intact;  Neck:  Neck supple, trachea midline. No masses, lumps or thyromegaly present.  Cardiovascular: Normal rate and rhythm. S1,S2 noted.  No murmur, rubs or gallops noted. No JVD or BLE edema. No carotid bruits noted. Pulmonary/Chest: Normal effort and positive vesicular breath sounds. No respiratory distress. No wheezes, rales or ronchi noted.  Abdomen: Normal bowel sounds.  Musculoskeletal: Strength 5/5 BUE/BLE.  No difficulty with gait.  Neurological: Alert and oriented. Cranial nerves II-XII grossly intact. Coordination normal.  Psychiatric: Mood and affect normal. Behavior is normal. Judgment and thought content normal.     BMET    Component Value Date/Time   NA 141 04/18/2023 0917   K 4.5 04/18/2023 0917   CL 106 04/18/2023 0917   CO2 28 04/18/2023 0917   GLUCOSE 111 (H) 04/18/2023 0917   BUN 15 04/18/2023 0917   CREATININE 1.14 (H) 04/18/2023 0917   CALCIUM  9.4 04/18/2023 0917   GFRNONAA 44 (L) 11/18/2020 0939   GFRNONAA 53 (L) 07/28/2020 0942   GFRAA 61 07/28/2020 0942    Lipid Panel     Component Value Date/Time   CHOL 169 04/18/2023 0917   TRIG 98 04/18/2023 0917   HDL 73 04/18/2023 0917   CHOLHDL 2.3 04/18/2023 0917   VLDL 18 10/17/2019 0630   LDLCALC 78 04/18/2023 0917     CBC    Component Value Date/Time   WBC 6.4 04/18/2023 0917   RBC 3.95 04/18/2023 0917   HGB 10.9 (L) 04/18/2023 0917   HCT 34.5 (L) 04/18/2023 0917   PLT 232 04/18/2023 0917   MCV 87.3 04/18/2023 0917   MCH 27.6 04/18/2023 0917   MCHC 31.6 (L) 04/18/2023 0917   RDW 12.7 04/18/2023 0917   LYMPHSABS 2.6 11/18/2020 0939   MONOABS 0.5 11/18/2020 0939   EOSABS 0.4 11/18/2020 0939   BASOSABS 0.0 11/18/2020 0939    Hgb A1C Lab Results  Component Value Date   HGBA1C 6.2 04/18/2023            Assessment & Plan:   Preventative Health Maintenance:  Flu shot today Tetanus UTD Encouraged her to get her COVID booster Pneumovax UTD Will get a copy of [facility] vaccine [facility] Pap smear UTD Mammogram UTD Bone density UTD Colon screening UTD Encouraged her to consume a balanced diet and exercise regimen Advised her to see an eye doctor and dentist annually We will check CBC, c-Met, TSH, Free T4, lipid,  andd A1c today  Dizziness:  Orthostatics negative Advised her that this could be lack of adequate water intake versus side effect of some of her medication Will monitor  RTC in 6 months, follow-up chronic conditions Nicki Reaper, NP

## 2023-11-07 NOTE — Assessment & Plan Note (Signed)
C-Met today 

## 2023-11-07 NOTE — Patient Instructions (Signed)
Health Maintenance for Postmenopausal Women Menopause is a normal process in which your ability to get pregnant comes to an end. This process happens slowly over many months or years, usually between the ages of 48 and 55. Menopause is complete when you have missed your menstrual period for 12 months. It is important to talk with your health care provider about some of the most common conditions that affect women after menopause (postmenopausal women). These include heart disease, cancer, and bone loss (osteoporosis). Adopting a healthy lifestyle and getting preventive care can help to promote your health and wellness. The actions you take can also lower your chances of developing some of these common conditions. What are the signs and symptoms of menopause? During menopause, you may have the following symptoms: Hot flashes. These can be moderate or severe. Night sweats. Decrease in sex drive. Mood swings. Headaches. Tiredness (fatigue). Irritability. Memory problems. Problems falling asleep or staying asleep. Talk with your health care provider about treatment options for your symptoms. Do I need hormone replacement therapy? Hormone replacement therapy is effective in treating symptoms that are caused by menopause, such as hot flashes and night sweats. Hormone replacement carries certain risks, especially as you become older. If you are thinking about using estrogen or estrogen with progestin, discuss the benefits and risks with your health care provider. How can I reduce my risk for heart disease and stroke? The risk of heart disease, heart attack, and stroke increases as you age. One of the causes may be a change in the body's hormones during menopause. This can affect how your body uses dietary fats, triglycerides, and cholesterol. Heart attack and stroke are medical emergencies. There are many things that you can do to help prevent heart disease and stroke. Watch your blood pressure High  blood pressure causes heart disease and increases the risk of stroke. This is more likely to develop in people who have high blood pressure readings or are overweight. Have your blood pressure checked: Every 3-5 years if you are 18-39 years of age. Every year if you are 40 years old or older. Eat a healthy diet  Eat a diet that includes plenty of vegetables, fruits, low-fat dairy products, and lean protein. Do not eat a lot of foods that are high in solid fats, added sugars, or sodium. Get regular exercise Get regular exercise. This is one of the most important things you can do for your health. Most adults should: Try to exercise for at least 150 minutes each week. The exercise should increase your heart rate and make you sweat (moderate-intensity exercise). Try to do strengthening exercises at least twice each week. Do these in addition to the moderate-intensity exercise. Spend less time sitting. Even light physical activity can be beneficial. Other tips Work with your health care provider to achieve or maintain a healthy weight. Do not use any products that contain nicotine or tobacco. These products include cigarettes, chewing tobacco, and vaping devices, such as e-cigarettes. If you need help quitting, ask your health care provider. Know your numbers. Ask your health care provider to check your cholesterol and your blood sugar (glucose). Continue to have your blood tested as directed by your health care provider. Do I need screening for cancer? Depending on your health history and family history, you may need to have cancer screenings at different stages of your life. This may include screening for: Breast cancer. Cervical cancer. Lung cancer. Colorectal cancer. What is my risk for osteoporosis? After menopause, you may be   at increased risk for osteoporosis. Osteoporosis is a condition in which bone destruction happens more quickly than new bone creation. To help prevent osteoporosis or  the bone fractures that can happen because of osteoporosis, you may take the following actions: If you are 19-50 years old, get at least 1,000 mg of calcium and at least 600 international units (IU) of vitamin D per day. If you are older than age 50 but younger than age 70, get at least 1,200 mg of calcium and at least 600 international units (IU) of vitamin D per day. If you are older than age 70, get at least 1,200 mg of calcium and at least 800 international units (IU) of vitamin D per day. Smoking and drinking excessive alcohol increase the risk of osteoporosis. Eat foods that are rich in calcium and vitamin D, and do weight-bearing exercises several times each week as directed by your health care provider. How does menopause affect my mental health? Depression may occur at any age, but it is more common as you become older. Common symptoms of depression include: Feeling depressed. Changes in sleep patterns. Changes in appetite or eating patterns. Feeling an overall lack of motivation or enjoyment of activities that you previously enjoyed. Frequent crying spells. Talk with your health care provider if you think that you are experiencing any of these symptoms. General instructions See your health care provider for regular wellness exams and vaccines. This may include: Scheduling regular health, dental, and eye exams. Getting and maintaining your vaccines. These include: Influenza vaccine. Get this vaccine each year before the flu season begins. Pneumonia vaccine. Shingles vaccine. Tetanus, diphtheria, and pertussis (Tdap) booster vaccine. Your health care provider may also recommend other immunizations. Tell your health care provider if you have ever been abused or do not feel safe at home. Summary Menopause is a normal process in which your ability to get pregnant comes to an end. This condition causes hot flashes, night sweats, decreased interest in sex, mood swings, headaches, or lack  of sleep. Treatment for this condition may include hormone replacement therapy. Take actions to keep yourself healthy, including exercising regularly, eating a healthy diet, watching your weight, and checking your blood pressure and blood sugar levels. Get screened for cancer and depression. Make sure that you are up to date with all your vaccines. This information is not intended to replace advice given to you by your health care provider. Make sure you discuss any questions you have with your health care provider. Document Revised: 04/13/2021 Document Reviewed: 04/13/2021 Elsevier Patient Education  2024 Elsevier Inc.  

## 2023-11-08 LAB — CBC
HCT: 34.1 % — ABNORMAL LOW (ref 35.0–45.0)
Hemoglobin: 10.8 g/dL — ABNORMAL LOW (ref 11.7–15.5)
MCH: 27.9 pg (ref 27.0–33.0)
MCHC: 31.7 g/dL — ABNORMAL LOW (ref 32.0–36.0)
MCV: 88.1 fL (ref 80.0–100.0)
MPV: 9.8 fL (ref 7.5–12.5)
Platelets: 233 10*3/uL (ref 140–400)
RBC: 3.87 10*6/uL (ref 3.80–5.10)
RDW: 12.6 % (ref 11.0–15.0)
WBC: 6.9 10*3/uL (ref 3.8–10.8)

## 2023-11-08 LAB — TSH: TSH: 3.27 m[IU]/L (ref 0.40–4.50)

## 2023-11-08 LAB — COMPLETE METABOLIC PANEL WITH GFR
AG Ratio: 1.4 (calc) (ref 1.0–2.5)
ALT: 18 U/L (ref 6–29)
AST: 21 U/L (ref 10–35)
Albumin: 4.2 g/dL (ref 3.6–5.1)
Alkaline phosphatase (APISO): 92 U/L (ref 37–153)
BUN/Creatinine Ratio: 16 (calc) (ref 6–22)
BUN: 22 mg/dL (ref 7–25)
CO2: 28 mmol/L (ref 20–32)
Calcium: 9.5 mg/dL (ref 8.6–10.4)
Chloride: 106 mmol/L (ref 98–110)
Creat: 1.39 mg/dL — ABNORMAL HIGH (ref 0.50–1.05)
Globulin: 2.9 g/dL (ref 1.9–3.7)
Glucose, Bld: 114 mg/dL — ABNORMAL HIGH (ref 65–99)
Potassium: 4.2 mmol/L (ref 3.5–5.3)
Sodium: 141 mmol/L (ref 135–146)
Total Bilirubin: 0.3 mg/dL (ref 0.2–1.2)
Total Protein: 7.1 g/dL (ref 6.1–8.1)
eGFR: 43 mL/min/{1.73_m2} — ABNORMAL LOW (ref >=60)

## 2023-11-08 LAB — HEMOGLOBIN A1C
Hgb A1c MFr Bld: 6.4 %{Hb} — ABNORMAL HIGH (ref <5.7)
Mean Plasma Glucose: 137 mg/dL
eAG (mmol/L): 7.6 mmol/L

## 2023-11-08 LAB — LIPID PANEL
Cholesterol: 168 mg/dL (ref <200)
HDL: 77 mg/dL (ref >=50)
LDL Cholesterol (Calc): 73 mg/dL
Non-HDL Cholesterol (Calc): 91 mg/dL (ref <130)
Total CHOL/HDL Ratio: 2.2 (calc) (ref <5.0)
Triglycerides: 96 mg/dL (ref <150)

## 2023-11-08 LAB — T4, FREE: Free T4: 0.9 ng/dL (ref 0.8–1.8)

## 2023-11-12 ENCOUNTER — Other Ambulatory Visit (HOSPITAL_COMMUNITY): Payer: Self-pay | Admitting: Psychiatry

## 2023-11-12 DIAGNOSIS — F411 Generalized anxiety disorder: Secondary | ICD-10-CM

## 2023-11-12 DIAGNOSIS — F200 Paranoid schizophrenia: Secondary | ICD-10-CM

## 2023-11-14 ENCOUNTER — Telehealth (HOSPITAL_BASED_OUTPATIENT_CLINIC_OR_DEPARTMENT_OTHER): Payer: Medicare HMO | Admitting: Psychiatry

## 2023-11-14 ENCOUNTER — Encounter (HOSPITAL_COMMUNITY): Payer: Self-pay | Admitting: Psychiatry

## 2023-11-14 VITALS — Wt 260.0 lb

## 2023-11-14 DIAGNOSIS — F200 Paranoid schizophrenia: Secondary | ICD-10-CM | POA: Diagnosis not present

## 2023-11-14 DIAGNOSIS — F411 Generalized anxiety disorder: Secondary | ICD-10-CM | POA: Diagnosis not present

## 2023-11-14 MED ORDER — HALOPERIDOL 5 MG PO TABS: ORAL_TABLET | ORAL | 1 refills | Status: DC

## 2023-11-14 MED ORDER — SERTRALINE HCL 50 MG PO TABS: 50.0000 mg | ORAL_TABLET | Freq: Every day | ORAL | 1 refills | Status: DC

## 2023-11-14 NOTE — Progress Notes (Signed)
[facility] MD Virtual Progress Note   Patient Location: In Car Provider Location: Home Office  I connect with patient by video and verified that I am speaking with correct person by using two identifiers. I discussed the limitations of evaluation and management by telemedicine and the availability of in person appointments. I also discussed with the patient that there may be a patient responsible charge related to this service. The patient expressed understanding and agreed to proceed.  Maria Franklin [id number] 62 y.o.  11/14/2023 2:10 PM  History of Present Illness:  Patient is evaluated by video session.  She is taking Zoloft and Haldol and that seems to have been her mood, paranoia, and depression.  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  She has no tremor or shakes or any EPS.  Her husband is supportive.  She denies any agitation, anger, suicidal thoughts.  Her appetite is okay.  Her weight is stable.  Recently she had a visit with the primary care but no new medication added.  Her anxiety and nervousness is manageable and is stable on current medication.  She is taking Haldol 2.5 mg at bedtime and Zoloft 50 mg every day.  Past Psychiatric History: H/O paranoia and delusions. H/O overdose and inpatient at [facility]. H/O overdose and inpatient at [facility]. Readmitted in 4 months due to noncompliant with medication. Last admission at [facility] in Nov 2020. Abilify worked but could not afford. No h/o abuse.      Outpatient Encounter Medications as of 11/14/2023  Medication Sig   ADVAIR DISKUS 250-50 MCG/ACT AEPB Inhale 1 puff into the lungs in the morning and at bedtime.   albuterol (PROVENTIL) (2.5 MG/3ML) 0.083% nebulizer solution Take 3 mLs (2.5 mg total) by nebulization every 4 (four) hours as needed for wheezing or shortness of breath.   albuterol (VENTOLIN HFA) 108 (90 Base) MCG/ACT inhaler INHALE 1 TO 2 PUFFS INTO THE LUNGS EVERY 6 HOURS AS  NEEDED WHEEZING/SHORTNESS OF BREATH   amLODipine (NORVASC) 10 MG tablet TAKE 1 TABLET BY MOUTH EVERY DAY   aspirin EC 81 MG EC tablet Take 1 tablet (81 mg total) by mouth daily.   atorvastatin (LIPITOR) 40 MG tablet TAKE 1 TABLET BY MOUTH EVERY DAY AT 6PM   b complex vitamins capsule Take 1 capsule by mouth daily.   Boswellia-Glucosamine-Vit D (OSTEO BI-FLEX ONE PER DAY PO) Take by mouth.   carvedilol (COREG) 12.5 MG tablet TAKE 1 TABLET BY MOUTH TWICE A DAY WITH FOOD   cetirizine (ZYRTEC) 10 MG tablet Take 1 tablet (10 mg total) by mouth daily.   Ferrous Sulfate (IRON) 325 (65 Fe) MG TABS Take 1 tablet by mouth every morning.   fluticasone (FLONASE) 50 MCG/ACT nasal spray SPRAY 2 SPRAYS INTO EACH NOSTRIL EVERY DAY   haloperidol (HALDOL) 5 MG tablet TAKE 0.5 TABLETS (2.5 MG TOTAL) BY MOUTH AT BEDTIME.   hydrALAZINE (APRESOLINE) 25 MG tablet TAKE 1/2 TABLET BY MOUTH EVERY MORNING AND AT BEDTIME   levothyroxine (SYNTHROID) 50 MCG tablet Take 1 tablet (50 mcg total) by mouth daily before breakfast.   lisinopril (ZESTRIL) 40 MG tablet TAKE 1 TABLET BY MOUTH EVERY DAY   Multiple Vitamins-Minerals (MULTIVITAMIN WITH MINERALS) tablet Take 1 tablet by mouth every evening.   omeprazole (PRILOSEC) 40 MG capsule TAKE 1 CAPSULE BY MOUTH EVERY DAY   sertraline (ZOLOFT) 50 MG tablet Take 1 tablet (50 mg total) by mouth daily.   No facility-administered encounter medications  on file as of 11/14/2023.    Recent Results (from the past 2160 hour(s))  CBC     Status: Abnormal   Collection Time: 11/07/23  9:33 AM  Result Value Ref Range   WBC 6.9 3.8 - 10.8 Thousand/uL   RBC 3.87 3.80 - 5.10 Million/uL   Hemoglobin 10.8 (L) 11.7 - 15.5 g/dL   HCT 16.1 (L) 09.6 - 04.5 %   MCV 88.1 80.0 - 100.0 fL   MCH 27.9 27.0 - 33.0 pg   MCHC 31.7 (L) 32.0 - 36.0 g/dL    Comment: For adults, a slight decrease in the calculated MCHC value (in the range of 30 to 32 g/dL) is most likely not clinically significant;  however, it should be interpreted with caution in correlation with other red cell parameters and the patient's clinical condition.    RDW 12.6 11.0 - 15.0 %   Platelets 233 140 - 400 Thousand/uL   MPV 9.8 7.5 - 12.5 fL  COMPLETE METABOLIC PANEL WITH GFR     Status: Abnormal   Collection Time: 11/07/23  9:33 AM  Result Value Ref Range   Glucose, Bld 114 (H) 65 - 99 mg/dL    Comment: .            Fasting reference interval . For someone without known diabetes, a glucose value between 100 and 125 mg/dL is consistent with prediabetes and should be confirmed with a follow-up test. .    BUN 22 7 - 25 mg/dL   Creat 4.09 (H) 8.11 - 1.05 mg/dL   eGFR 43 (L) > OR = 60 mL/min/1.35m2   BUN/Creatinine Ratio 16 6 - 22 (calc)   Sodium 141 135 - 146 mmol/L   Potassium 4.2 3.5 - 5.3 mmol/L   Chloride 106 98 - 110 mmol/L   CO2 28 20 - 32 mmol/L   Calcium 9.5 8.6 - 10.4 mg/dL   Total Protein 7.1 6.1 - 8.1 g/dL   Albumin 4.2 3.6 - 5.1 g/dL   Globulin 2.9 1.9 - 3.7 g/dL (calc)   AG Ratio 1.4 1.0 - 2.5 (calc)   Total Bilirubin 0.3 0.2 - 1.2 mg/dL   Alkaline phosphatase (APISO) 92 37 - 153 U/L   AST 21 10 - 35 U/L   ALT 18 6 - 29 U/L  Lipid panel     Status: None   Collection Time: 11/07/23  9:33 AM  Result Value Ref Range   Cholesterol 168 <200 mg/dL   HDL 77 > OR = 50 mg/dL   Triglycerides 96 <914 mg/dL   LDL Cholesterol (Calc) 73 mg/dL (calc)    Comment: Reference range: <100 . Desirable range <100 mg/dL for primary prevention;   <70 mg/dL for patients with CHD or diabetic patients  with > or = 2 CHD risk factors. Marland Kitchen LDL-C is now calculated using the Martin-Hopkins  calculation, which is a validated novel method providing  better accuracy than the Friedewald equation in the  estimation of LDL-C.  Horald Pollen et al. Lenox Ahr. 7829;562(13): 2061-2068  (http://education.[facility].com/faq/FAQ164)    Total CHOL/HDL Ratio 2.2 <5.0 (calc)   Non-HDL Cholesterol (Calc) 91 <086 mg/dL  (calc)    Comment: For patients with diabetes plus 1 major ASCVD risk  factor, treating to a non-HDL-C goal of <100 mg/dL  (LDL-C of <57 mg/dL) is considered a therapeutic  option.   T4, free     Status: None   Collection Time: 11/07/23  9:33 AM  Result Value Ref Range  Free T4 0.9 0.8 - 1.8 ng/dL  TSH     Status: None   Collection Time: 11/07/23  9:33 AM  Result Value Ref Range   TSH 3.27 0.40 - 4.50 mIU/L  Hemoglobin A1c     Status: Abnormal   Collection Time: 11/07/23  9:33 AM  Result Value Ref Range   Hgb A1c MFr Bld 6.4 (H) <5.7 % of total Hgb    Comment: For someone without known diabetes, a hemoglobin  A1c value between 5.7% and 6.4% is consistent with prediabetes and should be confirmed with a  follow-up test. . For someone with known diabetes, a value <7% indicates that their diabetes is well controlled. A1c targets should be individualized based on duration of diabetes, age, comorbid conditions, and other considerations. . This assay result is consistent with an increased risk of diabetes. . Currently, no consensus exists regarding use of hemoglobin A1c for diagnosis of diabetes for children. .    Mean Plasma Glucose 137 mg/dL   eAG (mmol/L) 7.6 mmol/L     Psychiatric Specialty Exam: Physical Exam  Review of Systems  Weight 260 lb (117.9 kg), last menstrual period 02/02/2017.There is no height or weight on file to calculate BMI.  General Appearance: Casual  Eye Contact:  Good  Speech:  Normal Rate  Volume:  Normal  Mood:  Euthymic  Affect:  Appropriate  Thought Process:  Goal Directed  Orientation:  Full (Time, Place, and Person)  Thought Content:  WDL  Suicidal Thoughts:  No  Homicidal Thoughts:  No  Memory:  Immediate;   Good Recent;   Good Remote;   Good  Judgement:  Good  Insight:  Good  Psychomotor Activity:  Normal  Concentration:  Concentration: Good and Attention Span: Good  Recall:  Good  Fund of Knowledge:  Good  Language:  Good   Akathisia:  No  Handed:  Right  AIMS (if indicated):     Assets:  Communication Skills Desire for Improvement Housing Resilience Social Support Talents/Skills Transportation  ADL's:  Intact  Cognition:  WNL  Sleep:  ok     Assessment/Plan: Paranoid schizophrenia, chronic condition (HCC) - Plan: haloperidol (HALDOL) 5 MG tablet, sertraline (ZOLOFT) 50 MG tablet  GAD (generalized anxiety disorder) - Plan: sertraline (ZOLOFT) 50 MG tablet  Patient is stable on current medication.  Continue Haldol 2.5 mg at bedtime and Zoloft 50 mg daily.  Discussed medication side effects and benefits.  Recommend to call us back if she has any question or any concern.  Follow-up in 6 months.   Follow Up Instructions:     I discussed the assessment and treatment plan with the patient. The patient was provided an opportunity to ask questions and all were answered. The patient agreed with the plan and demonstrated an understanding of the instructions.   The patient was advised to call back or seek an in-person evaluation if the symptoms worsen or if the condition fails to improve as anticipated.    Collaboration of Care: Other provider involved in patient's care AEB notes are available in epic to review  Patient/Guardian was advised Release of Information must be obtained prior to any record release in order to collaborate their care with an outside provider. Patient/Guardian was advised if they have not already done so to contact the registration department to sign all necessary forms in order for [place] to release information regarding their care.   Consent: Patient/Guardian gives verbal consent for treatment and assignment of benefits for services provided  during this visit. Patient/Guardian expressed understanding and agreed to proceed.     I provided 15 minutes of non face to face time during this encounter.  Note: This document was prepared by [employer] voice dictation technology and any errors  that results from this process are unintentional.    Cleotis Nipper, MD 11/14/2023

## 2023-12-06 ENCOUNTER — Other Ambulatory Visit: Payer: Self-pay | Admitting: Internal Medicine

## 2023-12-06 DIAGNOSIS — E039 Hypothyroidism, unspecified: Secondary | ICD-10-CM

## 2023-12-06 DIAGNOSIS — I10 Essential (primary) hypertension: Secondary | ICD-10-CM

## 2023-12-09 NOTE — Telephone Encounter (Signed)
 Requested Prescriptions  Pending Prescriptions Disp Refills   levothyroxine  (SYNTHROID ) 50 MCG tablet [Pharmacy Med Name: LEVOTHYROXINE  50 MCG TABLET] 90 tablet 3    Sig: TAKE 1 TABLET BY MOUTH DAILY BEFORE BREAKFAST     Endocrinology:  Hypothyroid Agents Passed - 12/09/2023  4:29 PM      Passed - TSH in normal range and within 360 days    TSH  Date Value Ref Range Status  11/07/2023 3.27 0.40 - 4.50 mIU/L Final         Passed - Valid encounter within last 12 months    Recent Outpatient Visits           1 month ago Encounter for general adult medical examination with abnormal findings   [place] [facility] [place], Angeline ORN, NP   7 months ago Type 2 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 disease, without long-term current use of insulin  [facility])   [place] [facility] [place], [place] W, NP   1 year ago Encounter for general adult medical examination with abnormal findings   [place] [facility] [place], Angeline ORN, NP   1 year ago Type 2 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 disease, without long-term current use of insulin  [facility])   [place] [facility] [place], [place] W, NP   1 year ago Postmenopausal vaginal bleeding   [place] [facility] [place], Angeline ORN, NP       Future Appointments             In 5 months [place], Angeline ORN, NP [place] [facility]             hydrALAZINE  (APRESOLINE ) 25 MG tablet [Pharmacy Med Name: HYDRALAZINE  25 MG TABLET] 90 tablet 3    Sig: TAKE 1/2 TABLET BY MOUTH EVERY MORNING AND AT BEDTIME     Cardiovascular:  Vasodilators Failed - 12/09/2023  4:29 PM      Failed - HCT in normal range and within 360 days    HCT  Date Value Ref Range Status  11/07/2023 34.1 (L) 35.0 - 45.0 % Final         Failed - HGB in normal range and within 360 days    Hemoglobin  Date Value Ref Range Status  11/07/2023 10.8 (L) 11.7 - 15.5 g/dL  Final         Failed - ANA Screen, Ifa, Serum in normal range and within 360 days    Anti Nuclear Antibody (ANA)  Date Value Ref Range Status  07/22/2021 NEGATIVE NEGATIVE Final    Comment:    ANA IFA is a first line screen for detecting the presence of up to approximately 150 autoantibodies in various autoimmune diseases. A negative ANA IFA result suggests an ANA-associated autoimmune disease is not present at this time, but is not definitive. If there is high clinical suspicion for Sjogren's syndrome, testing for anti-SS-A/Ro antibody should be considered. Anti-Jo-1 antibody should be considered for clinically suspected inflammatory myopathies. . AC-0: Negative . International Consensus on ANA Patterns (severties.uy) . For additional information, please refer to http://education.[facility].com/faq/FAQ177 (This link is being provided for informational/ educational purposes only.) .          Passed - RBC in normal range and within 360 days    RBC  Date Value Ref Range Status  11/07/2023 3.87 3.80 - 5.10 Million/uL Final  Passed - WBC in normal range and within 360 days    WBC  Date Value Ref Range Status  11/07/2023 6.9 3.8 - 10.8 Thousand/uL Final         Passed - PLT in normal range and within 360 days    Platelets  Date Value Ref Range Status  11/07/2023 233 140 - 400 Thousand/uL Final         Passed - Last BP in normal range    BP Readings from Last 1 Encounters:  11/07/23 116/78         Passed - Valid encounter within last 12 months    Recent Outpatient Visits           1 month ago Encounter for general adult medical examination with abnormal findings   [place] [facility] [place], [place] W, NP   7 months ago Type 2 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 disease, without long-term current use of insulin  [facility])   [place] [facility] [place], Angeline ORN, NP   1 year ago  Encounter for general adult medical examination with abnormal findings   [place] [facility] [place], [place] W, NP   1 year ago Type 2 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 disease, without long-term current use of insulin  [facility])   [place] [facility] [place], Angeline ORN, NP   1 year ago Postmenopausal vaginal bleeding   [place] [facility] [place], Angeline ORN, NP       Future Appointments             In 5 months Baity, Angeline ORN, NP [place] [facility], [facility]             omeprazole  (PRILOSEC) 40 MG capsule [Pharmacy Med Name: OMEPRAZOLE  DR 40 MG CAPSULE] 90 capsule 3    Sig: TAKE 1 CAPSULE BY MOUTH EVERY DAY     Gastroenterology: Proton Pump Inhibitors Passed - 12/09/2023  4:29 PM      Passed - Valid encounter within last 12 months    Recent Outpatient Visits           1 month ago Encounter for general adult medical examination with abnormal findings   [place] [facility] [place], [place] W, NP   7 months ago Type 2 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 disease, without long-term current use of insulin  [facility])   [place] [facility] [place], Angeline ORN, NP   1 year ago Encounter for general adult medical examination with abnormal findings   [place] [facility] [place], [place] W, NP   1 year ago Type 2 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 disease, without long-term current use of insulin  [facility])    [facility] [place], Angeline ORN, NP   1 year ago Postmenopausal vaginal bleeding    [facility] [place], Angeline ORN, NP       Future Appointments             In 5 months Baity, Angeline ORN, NP  [facility]             carvedilol  (COREG ) 12.5 MG tablet [Pharmacy Med Name: CARVEDILOL  12.5 MG TABLET] 180 tablet 1    Sig: TAKE 1 TABLET BY MOUTH TWICE A DAY WITH FOOD      Cardiovascular: Beta Blockers 3  Failed - 12/09/2023  4:29 PM      Failed - Cr in normal range and within 360 days    Creat  Date Value Ref Range Status  11/07/2023 1.39 (H) 0.50 - 1.05 mg/dL Final   Creatinine,U  Date Value Ref Range Status  07/25/2019 153.6 mg/dL Final   Creatinine, Urine  Date Value Ref Range Status  04/18/2023 168 20 - 275 mg/dL Final         Passed - AST in normal range and within 360 days    AST  Date Value Ref Range Status  11/07/2023 21 10 - 35 U/L Final         Passed - ALT in normal range and within 360 days    ALT  Date Value Ref Range Status  11/07/2023 18 6 - 29 U/L Final         Passed - Last BP in normal range    BP Readings from Last 1 Encounters:  11/07/23 116/78         Passed - Last Heart Rate in normal range    Pulse Readings from Last 1 Encounters:  04/18/23 70         Passed - Valid encounter within last 6 months    Recent Outpatient Visits           1 month ago Encounter for general adult medical examination with abnormal findings   [place] [facility] [place], [place] W, NP   7 months ago Type 2 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 disease, without long-term current use of insulin  [facility])   [place] [facility] [place], Angeline ORN, NP   1 year ago Encounter for general adult medical examination with abnormal findings   [place] [facility] [place], [place] W, NP   1 year ago Type 2 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 disease, without long-term current use of insulin  [facility])   [place] [facility] [place], Angeline ORN, NP   1 year ago Postmenopausal vaginal bleeding   [place] [facility] [place] [facility] [place], Angeline ORN, NP       Future Appointments             In 5 months Baity, Angeline ORN, NP  [facility], [facility]

## 2024-01-04 ENCOUNTER — Other Ambulatory Visit: Payer: Self-pay | Admitting: Internal Medicine

## 2024-01-04 DIAGNOSIS — Z1231 Encounter for screening mammogram for malignant neoplasm of breast: Secondary | ICD-10-CM

## 2024-01-05 ENCOUNTER — Other Ambulatory Visit: Payer: Self-pay | Admitting: Internal Medicine

## 2024-01-05 DIAGNOSIS — I10 Essential (primary) hypertension: Secondary | ICD-10-CM

## 2024-01-06 NOTE — Telephone Encounter (Signed)
Requested Prescriptions  Pending Prescriptions Disp Refills   amLODipine (NORVASC) 10 MG tablet [Pharmacy Med Name: AMLODIPINE BESYLATE 10 MG TAB] 90 tablet 1    Sig: TAKE 1 TABLET BY MOUTH EVERY DAY     Cardiovascular: Calcium Channel Blockers 2 Passed - 01/06/2024  9:00 AM      Passed - Last BP in normal range    BP Readings from Last 1 Encounters:  11/07/23 116/78         Passed - Last Heart Rate in normal range    Pulse Readings from Last 1 Encounters:  04/18/23 70         Passed - Valid encounter within last 6 months    Recent Outpatient Visits           2 months ago Encounter for general adult medical examination with abnormal findings   [place] [facility] [place], [place] W, NP   8 months ago Type 2 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 disease, without long-term current use of insulin [facility])   [place] [facility] [place], Salvadore [place], NP   1 year ago Encounter for general adult medical examination with abnormal findings   [place] [facility] [place], [place] W, NP   1 year ago Type 2 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 disease, without long-term current use of insulin [facility]   [place] [facility] [place], Salvadore [place], NP   2 years ago Postmenopausal vaginal bleeding   [place] [place], Salvadore [place], NP       Future Appointments             In 4 months Baity, Salvadore [place], NP [place] [facility]

## 2024-01-13 ENCOUNTER — Other Ambulatory Visit: Payer: Self-pay | Admitting: Internal Medicine

## 2024-01-13 DIAGNOSIS — I10 Essential (primary) hypertension: Secondary | ICD-10-CM

## 2024-01-13 NOTE — Telephone Encounter (Signed)
 Requested Prescriptions  Pending Prescriptions Disp Refills   lisinopril  (ZESTRIL ) 40 MG tablet [Pharmacy Med Name: LISINOPRIL  40 MG TABLET] 90 tablet 0    Sig: TAKE 1 TABLET BY MOUTH EVERY DAY     Cardiovascular:  ACE Inhibitors Failed - 01/13/2024  2:14 PM      Failed - Cr in normal range and within 180 days    Creat  Date Value Ref Range Status  11/07/2023 1.39 (H) 0.50 - 1.05 mg/dL Final   Creatinine,U  Date Value Ref Range Status  07/25/2019 153.6 mg/dL Final   Creatinine, Urine  Date Value Ref Range Status  04/18/2023 168 20 - 275 mg/dL Final         Passed - K in normal range and within 180 days    Potassium  Date Value Ref Range Status  11/07/2023 4.2 3.5 - 5.3 mmol/L Final         Passed - Patient is not pregnant      Passed - Last BP in normal range    BP Readings from Last 1 Encounters:  11/07/23 116/78         Passed - Valid encounter within last 6 months    Recent Outpatient Visits           2 months ago Encounter for general adult medical examination with abnormal findings   [place] [facility] [place], Angeline ORN, NP   9 months ago Type 2 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 disease, without long-term current use of insulin  [facility])   [place] [facility] [place], Angeline ORN, NP   1 year ago Encounter for general adult medical examination with abnormal findings   [place] [facility] [place], [place] W, NP   1 year ago Type 2 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 disease, without long-term current use of insulin  [facility])   [place] [facility] [place], Angeline ORN, NP   2 years ago Postmenopausal vaginal bleeding   [place] [facility] [place], Angeline ORN, NP       Future Appointments             In 3 months Baity, Angeline ORN, NP  [facility], [facility]

## 2024-02-07 ENCOUNTER — Encounter: Payer: Self-pay | Admitting: Internal Medicine

## 2024-02-17 ENCOUNTER — Ambulatory Visit
Admission: RE | Admit: 2024-02-17 | Discharge: 2024-02-17 | Disposition: A | Discharge status: Home / Self Care | Payer: Medicare HMO | Source: Ambulatory Visit | Attending: Internal Medicine | Admitting: Internal Medicine

## 2024-02-17 DIAGNOSIS — Z1231 Encounter for screening mammogram for malignant neoplasm of breast: Secondary | ICD-10-CM | POA: Diagnosis not present

## 2024-02-22 ENCOUNTER — Encounter: Payer: Self-pay | Admitting: Internal Medicine

## 2024-02-22 DIAGNOSIS — H5213 Myopia, bilateral: Secondary | ICD-10-CM | POA: Diagnosis not present

## 2024-02-22 LAB — HM DIABETES EYE EXAM

## 2024-03-02 ENCOUNTER — Encounter: Payer: Self-pay | Admitting: Internal Medicine

## 2024-03-14 ENCOUNTER — Other Ambulatory Visit (HOSPITAL_COMMUNITY): Payer: Self-pay | Admitting: Psychiatry

## 2024-03-14 DIAGNOSIS — F200 Paranoid schizophrenia: Secondary | ICD-10-CM

## 2024-03-14 DIAGNOSIS — F411 Generalized anxiety disorder: Secondary | ICD-10-CM

## 2024-03-17 ENCOUNTER — Telehealth: Admitting: Family

## 2024-03-17 DIAGNOSIS — J069 Acute upper respiratory infection, unspecified: Secondary | ICD-10-CM | POA: Diagnosis not present

## 2024-03-17 NOTE — Patient Instructions (Signed)

## 2024-03-17 NOTE — Progress Notes (Signed)
 Virtual Visit Consent   [name], you are scheduled for a virtual visit with a [facility] provider today. Just as with appointments in the office, your consent must be obtained to participate. Your consent will be active for this visit and any virtual visit you may have with one of our providers in the next 365 days. If you have a MyChart account, a copy of this consent can be sent to you electronically.  As this is a virtual visit, video technology does not allow for your provider to perform a traditional examination. This may limit your provider's ability to fully assess your condition. If your provider identifies any concerns that need to be evaluated in person or the need to arrange testing (such as labs, EKG, etc.), we will make arrangements to do so. Although advances in technology are sophisticated, we cannot ensure that it will always work on either your end or our end. If the connection with a video visit is poor, the visit may have to be switched to a telephone visit. With either a video or telephone visit, we are not always able to ensure that we have a secure connection.  By engaging in this virtual visit, you consent to the provision of healthcare and authorize for your insurance to be billed (if applicable) for the services provided during this visit. Depending on your insurance coverage, you may receive a charge related to this service.  I need to obtain your verbal consent now. Are you willing to proceed with your visit today? Maria Franklin has provided verbal consent on 03/17/2024 for a virtual visit (video or telephone). Tommas Fragmin, FNP  Date: 03/17/2024 4:00 PM   Virtual Visit via Video Note   I, Tommas Fragmin, connected with  Maria Franklin  ([id number], 02-06-1961) on 03/17/24 at  4:00 PM EDT by a video-enabled telemedicine application and verified that I am speaking with the correct person using two identifiers.  Location: Patient: Virtual Visit Location Patient:  Home Provider: Virtual Visit Location Provider: Home Office   I discussed the limitations of evaluation and management by telemedicine and the availability of in person appointments. The patient expressed understanding and agreed to proceed.    History of Present Illness: Maria Franklin is a 63 y.o. who identifies as a female who was assigned female at birth, and is being seen today for sinus pressure that started two days ago .  HPI: Sinusitis This is a new problem. The current episode started in the past 7 days. The problem has been gradually worsening since onset. There has been no fever. Her pain is at a severity of 8/10. The pain is mild. Associated symptoms include headaches and sinus pressure. Pertinent negatives include no congestion, coughing, ear pain or sore throat. Past treatments include oral decongestants and nasal decongestants (claritin). The treatment provided mild relief.    Problems:  Patient Active Problem List   Diagnosis Date Noted   Class 3 severe obesity due to excess calories with body mass index (BMI) of 40.0 to 44.9 in adult [facility]) 07/24/2021   Paranoid schizophrenia (HCC) 10/16/2019   CKD (chronic kidney disease), stage III (HCC) 08/29/2019   OSA (obstructive sleep apnea) 04/29/2016   GERD (gastroesophageal reflux disease) 10/28/2015   Type 2 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 disease, without long-term current use of insulin (HCC) 04/19/2014   Ludwig's angina 12/30/2013   Hypertension 01/10/2013   Hyperlipidemia associated with type 2 diabetes mellitus (HCC) 01/10/2013   Hypothyroidism 01/10/2013  Asthma 03/13/2009    Allergies:  Allergies  Allergen Reactions   Bee Pollen Cough   Pollen Extract Cough   Latex Itching, Rash and Other (See Comments)    Severe itching   Medications:  Current Outpatient Medications:    ADVAIR DISKUS 250-50 MCG/ACT AEPB, Inhale 1 puff into the lungs in the morning and at bedtime., Disp: 60 each, Rfl: 5    albuterol (PROVENTIL) (2.5 MG/3ML) 0.083% nebulizer solution, Take 3 mLs (2.5 mg total) by nebulization every 4 (four) hours as needed for wheezing or shortness of breath., Disp: 75 mL, Rfl: 1   albuterol (VENTOLIN HFA) 108 (90 Base) MCG/ACT inhaler, INHALE 1 TO 2 PUFFS INTO THE LUNGS EVERY 6 HOURS AS NEEDED WHEEZING/SHORTNESS OF BREATH, Disp: 18 each, Rfl: 1   amLODipine (NORVASC) 10 MG tablet, TAKE 1 TABLET BY MOUTH EVERY DAY, Disp: 90 tablet, Rfl: 1   aspirin EC 81 MG EC tablet, Take 1 tablet (81 mg total) by mouth daily., Disp: 30 tablet, Rfl: 0   atorvastatin (LIPITOR) 40 MG tablet, TAKE 1 TABLET BY MOUTH EVERY DAY AT 6PM, Disp: 90 tablet, Rfl: 1   b complex vitamins capsule, Take 1 capsule by mouth daily., Disp: , Rfl:    Boswellia-Glucosamine-Vit D (OSTEO BI-FLEX ONE PER DAY PO), Take by mouth., Disp: , Rfl:    carvedilol (COREG) 12.5 MG tablet, TAKE 1 TABLET BY MOUTH TWICE A DAY WITH FOOD, Disp: 180 tablet, Rfl: 1   cetirizine (ZYRTEC) 10 MG tablet, Take 1 tablet (10 mg total) by mouth daily., Disp: 30 tablet, Rfl: 5   Ferrous Sulfate (IRON) 325 (65 Fe) MG TABS, Take 1 tablet by mouth every morning., Disp: , Rfl:    fluticasone (FLONASE) 50 MCG/ACT nasal spray, SPRAY 2 SPRAYS INTO EACH NOSTRIL EVERY DAY, Disp: 16 mL, Rfl: 5   haloperidol (HALDOL) 5 MG tablet, TAKE 0.5 TABLETS (2.5 MG TOTAL) BY MOUTH AT BEDTIME., Disp: 45 tablet, Rfl: 1   hydrALAZINE (APRESOLINE) 25 MG tablet, TAKE 1/2 TABLET BY MOUTH EVERY MORNING AND AT BEDTIME, Disp: 90 tablet, Rfl: 3   levothyroxine (SYNTHROID) 50 MCG tablet, TAKE 1 TABLET BY MOUTH DAILY BEFORE BREAKFAST, Disp: 90 tablet, Rfl: 3   lisinopril (ZESTRIL) 40 MG tablet, TAKE 1 TABLET BY MOUTH EVERY DAY, Disp: 90 tablet, Rfl: 0   Multiple Vitamins-Minerals (MULTIVITAMIN WITH MINERALS) tablet, Take 1 tablet by mouth every evening., Disp: , Rfl:    omeprazole (PRILOSEC) 40 MG capsule, TAKE 1 CAPSULE BY MOUTH EVERY DAY, Disp: 90 capsule, Rfl: 3   sertraline  (ZOLOFT) 50 MG tablet, Take 1 tablet (50 mg total) by mouth daily., Disp: 90 tablet, Rfl: 1  Observations/Objective: Patient is well-developed, well-nourished in no acute distress.  Resting comfortably  at home.  Head is normocephalic, atraumatic.  No labored breathing.  Speech is clear and coherent with logical content.  Patient is alert and oriented at baseline.  Sinus congestion  Assessment and Plan: 1. Viral URI (Primary)  - Take meds as prescribed - Use a cool mist humidifier  -Use saline nose sprays frequently -Force fluids -For any cough or congestion  Use plain Mucinex- regular strength or max strength is fine -For fever or aces or pains- take tylenol or ibuprofen. -Throat lozenges if helps -Continue zyrtec, flonase, and decongestant Follow up if symptoms worsen or do not improve   Follow Up Instructions: I discussed the assessment and treatment plan with the patient. The patient was provided an opportunity to ask questions and all were  answered. The patient agreed with the plan and demonstrated an understanding of the instructions.  A copy of instructions were sent to the patient via MyChart unless otherwise noted below.     The patient was advised to call back or seek an in-person evaluation if the symptoms worsen or if the condition fails to improve as anticipated.    Tommas Fragmin, FNP

## 2024-04-06 ENCOUNTER — Other Ambulatory Visit: Payer: Self-pay | Admitting: Internal Medicine

## 2024-04-06 DIAGNOSIS — E782 Mixed hyperlipidemia: Secondary | ICD-10-CM

## 2024-04-09 NOTE — Telephone Encounter (Signed)
 Lipid panel in date.    LOV 11/07/2023.  Requested Prescriptions  Pending Prescriptions Disp Refills   atorvastatin  (LIPITOR) 40 MG tablet [Pharmacy Med Name: ATORVASTATIN  40 MG TABLET] 90 tablet 0    Sig: TAKE 1 TABLET BY MOUTH EVERY DAY AT 6PM     Cardiovascular:  Antilipid - Statins Failed - 04/09/2024  2:08 PM      Failed - Valid encounter within last 12 months    Recent Outpatient Visits   None     Future Appointments             In 4 weeks Baity, Rankin Buzzard, NP [facility]            Failed - Lipid Panel in normal range within the last 12 months    Cholesterol  Date Value Ref Range Status  11/07/2023 168 <200 mg/dL Final   LDL Cholesterol (Calc)  Date Value Ref Range Status  11/07/2023 73 mg/dL (calc) Final    Comment:    Reference range: <100 . Desirable range <100 mg/dL for primary prevention;   <70 mg/dL for patients with CHD or diabetic patients  with > or = 2 CHD risk factors. Aaron Aas LDL-C is now calculated using the Martin-Hopkins  calculation, which is a validated novel method providing  better accuracy than the Friedewald equation in the  estimation of LDL-C.  Melinda Sprawls et al. Erroll Heard. 1914;782(95): 2061-2068  (http://education.[facility].com/faq/FAQ164)    Direct LDL  Date Value Ref Range Status  01/14/2014 97.1 mg/dL Final    Comment:    Optimal:  <100 mg/dLNear or Above Optimal:  100-129 mg/dLBorderline High:  130-159 mg/dLHigh:  160-189 mg/dLVery High:  >190 mg/dL   HDL  Date Value Ref Range Status  11/07/2023 77 > OR = 50 mg/dL Final   Triglycerides  Date Value Ref Range Status  11/07/2023 96 <150 mg/dL Final         Passed - Patient is not pregnant

## 2024-04-13 ENCOUNTER — Other Ambulatory Visit: Payer: Self-pay | Admitting: Internal Medicine

## 2024-04-13 DIAGNOSIS — I10 Essential (primary) hypertension: Secondary | ICD-10-CM

## 2024-04-16 NOTE — Telephone Encounter (Signed)
 Courtesy refill. Patient must keep upcoming appointment for additional refills. Requested Prescriptions  Pending Prescriptions Disp Refills   lisinopril  (ZESTRIL ) 40 MG tablet [Pharmacy Med Name: LISINOPRIL  40 MG TABLET] 30 tablet 0    Sig: TAKE 1 TABLET BY MOUTH EVERY DAY     Cardiovascular:  ACE Inhibitors Failed - 04/16/2024 11:51 AM      Failed - Cr in normal range and within 180 days    Creat  Date Value Ref Range Status  11/07/2023 1.39 (H) 0.50 - 1.05 mg/dL Final   Creatinine,U  Date Value Ref Range Status  07/25/2019 153.6 mg/dL Final   Creatinine, Urine  Date Value Ref Range Status  04/18/2023 168 20 - 275 mg/dL Final         Failed - Valid encounter within last 6 months    Recent Outpatient Visits   None     Future Appointments             In 3 weeks Carollynn Cirri, NP [place] [facility]            Passed - K in normal range and within 180 days    Potassium  Date Value Ref Range Status  11/07/2023 4.2 3.5 - 5.3 mmol/L Final         Passed - Patient is not pregnant      Passed - Last BP in normal range    BP Readings from Last 1 Encounters:  11/07/23 116/78

## 2024-04-27 ENCOUNTER — Ambulatory Visit (INDEPENDENT_AMBULATORY_CARE_PROVIDER_SITE_OTHER): Payer: Medicare HMO

## 2024-04-27 DIAGNOSIS — Z0000 Encounter for general adult medical examination without abnormal findings: Secondary | ICD-10-CM | POA: Diagnosis not present

## 2024-04-27 NOTE — Progress Notes (Signed)
 Subjective:   Maria Franklin is a 63 y.o. who presents for a Medicare Wellness preventive visit.  As a reminder, Annual Wellness Visits don't include a physical exam, and some assessments may be limited, especially if this visit is performed virtually. We may recommend an in-person follow-up visit with your provider if needed.  Visit Complete: Virtual I connected with  Maria Franklin on 04/27/24 by a audio enabled telemedicine application and verified that I am speaking with the correct person using two identifiers.  Patient Location: Home  Provider Location: Office/Clinic  I discussed the limitations of evaluation and management by telemedicine. The patient expressed understanding and agreed to proceed.  Vital Signs: Because this visit was a virtual/telehealth visit, some criteria may be missing or patient reported. Any vitals not documented were not able to be obtained and vitals that have been documented are patient reported.  VideoDeclined- This patient declined Librarian, academic. Therefore the visit was completed with audio only.  Persons Participating in Visit: Patient.  AWV Questionnaire: No: Patient Medicare AWV questionnaire was not completed prior to this visit.  Cardiac Risk Factors include: advanced age (>83men, >18 women);diabetes mellitus;hypertension;dyslipidemia;sedentary lifestyle;obesity (BMI >30kg/m2)     Objective:     There were no vitals filed for this visit. There is no height or weight on file to calculate BMI.     04/27/2024    8:59 AM 04/22/2023    1:41 PM 11/18/2020    7:02 AM 07/28/2020    9:52 AM 10/16/2019    3:00 PM 10/16/2019    1:59 PM 08/30/2019    8:26 AM  Advanced Directives  Does Patient Have a Medical Advance Directive? No No No No   No  Would patient like information on creating a medical advance directive? No - Patient declined No - Patient declined  No - Patient declined   No - Patient declined      Information is confidential and restricted. Go to Review Flowsheets to unlock data.    Current Medications (verified) Outpatient Encounter Medications as of 04/27/2024  Medication Sig   ADVAIR  DISKUS 250-50 MCG/ACT AEPB Inhale 1 puff into the lungs in the morning and at bedtime.   albuterol  (PROVENTIL ) (2.5 MG/3ML) 0.083% nebulizer solution Take 3 mLs (2.5 mg total) by nebulization every 4 (four) hours as needed for wheezing or shortness of breath.   albuterol  (VENTOLIN  HFA) 108 (90 Base) MCG/ACT inhaler INHALE 1 TO 2 PUFFS INTO THE LUNGS EVERY 6 HOURS AS NEEDED WHEEZING/SHORTNESS OF BREATH   amLODipine  (NORVASC ) 10 MG tablet TAKE 1 TABLET BY MOUTH EVERY DAY   aspirin  EC 81 MG EC tablet Take 1 tablet (81 mg total) by mouth daily.   atorvastatin  (LIPITOR) 40 MG tablet TAKE 1 TABLET BY MOUTH EVERY DAY AT 6PM   b complex vitamins capsule Take 1 capsule by mouth daily.   Boswellia-Glucosamine-Vit D (OSTEO BI-FLEX ONE PER DAY PO) Take by mouth.   carvedilol  (COREG ) 12.5 MG tablet TAKE 1 TABLET BY MOUTH TWICE A DAY WITH FOOD   cetirizine  (ZYRTEC ) 10 MG tablet Take 1 tablet (10 mg total) by mouth daily.   Ferrous Sulfate  (IRON) 325 (65 Fe) MG TABS Take 1 tablet by mouth every morning.   fluticasone  (FLONASE ) 50 MCG/ACT nasal spray SPRAY 2 SPRAYS INTO EACH NOSTRIL EVERY DAY   haloperidol  (HALDOL ) 5 MG tablet TAKE 0.5 TABLETS (2.5 MG TOTAL) BY MOUTH AT BEDTIME.   hydrALAZINE  (APRESOLINE ) 25 MG tablet TAKE 1/2 TABLET BY MOUTH  EVERY MORNING AND AT BEDTIME   levothyroxine  (SYNTHROID ) 50 MCG tablet TAKE 1 TABLET BY MOUTH DAILY BEFORE BREAKFAST   lisinopril  (ZESTRIL ) 40 MG tablet TAKE 1 TABLET BY MOUTH EVERY DAY   Multiple Vitamins-Minerals (MULTIVITAMIN WITH MINERALS) tablet Take 1 tablet by mouth every evening.   omeprazole  (PRILOSEC) 40 MG capsule TAKE 1 CAPSULE BY MOUTH EVERY DAY   sertraline  (ZOLOFT ) 50 MG tablet Take 1 tablet (50 mg total) by mouth daily.   No facility-administered encounter  medications on file as of 04/27/2024.    Allergies (verified) Bee pollen, Pollen extract, and Latex   History: Past Medical History:  Diagnosis Date   Allergy    Anemia    Angio-edema    Asthma    ALL THE TIME   LAST FLARE UP 12/2018   Chest pain 08/2019   Chicken pox    Chronic kidney disease    Diabetes mellitus without complication (HCC)    GERD (gastroesophageal reflux disease)    Hyperlipidemia    Hypertension    Hyperthyroidism    Recurrent upper respiratory infection (URI)    Schizophrenia (HCC)    Sleep apnea    DX 2-3 YRS AGO.   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/2019   SINOSCOPY     SINUS ENDO W/FUSION Bilateral 01/05/2019   Procedure: ENDOSCOPIC SINUS SURGERY WITH NAVIGATION;  Surgeon: Ammon Bales, MD;  Location: [facility] OR;  Service: ENT;  Laterality: Bilateral;   SINUS IRRIGATION     TONSILLECTOMY     TUBAL LIGATION     TURBINATE REDUCTION N/A 01/05/2019   Procedure: Turbinate Reduction;  Surgeon: Ammon Bales, MD;  Location: [facility] OR;  Service: ENT;  Laterality: N/A;   Family History  Problem Relation Age of Onset   Mental illness Mother    Hypertension Mother    Schizophrenia Mother    Depression Mother    Heart disease Mother    Diabetes Mother    Cancer Father        LUNG   Hypertension Sister    Cancer Sister        BREAST   Breast cancer Sister    Hypertension Sister    Heart attack Sister    Hypertension Sister    Heart attack Sister    Mental illness Brother    Hypertension Brother    Schizophrenia Brother    Depression Brother    Hypertension Brother    Schizophrenia Brother    Hypertension Brother    Breast cancer Maternal Aunt    Schizophrenia Maternal Uncle    Heart disease Maternal Grandmother    Depression Maternal Grandmother    Schizophrenia Maternal Grandmother    Allergic rhinitis Neg Hx    Angioedema Neg Hx    Asthma  Neg Hx    Atopy Neg Hx    Eczema Neg Hx    Immunodeficiency Neg Hx    Urticaria Neg Hx    Colon cancer Neg Hx    Colon polyps Neg Hx    Stomach cancer Neg Hx    Rectal cancer Neg Hx    Social History   Socioeconomic History   Marital status: Married    Spouse name: Zora Hires   Number of children: 4   Years of education: Associate Degree   Highest education level: Associate degree: occupational, Scientist, product/process development, or vocational program  Occupational  History   Occupation: unemployed  Tobacco Use   Smoking status: Former    Current packs/day: 0.00    Average packs/day: 0.3 packs/day for 6.0 years (1.5 ttl pk-yrs)    Types: Cigarettes    Start date: 98    Quit date: 25    Years since quitting: 38.4   Smokeless tobacco: Never   Tobacco comments:    QUIT IN HER EARLY 20'S  Vaping Use   Vaping status: Never Used  Substance and Sexual Activity   Alcohol use: No    Alcohol/week: 0.0 standard drinks of alcohol   Drug use: No   Sexual activity: Not Currently  Other Topics Concern   Not on file  Social History Narrative   Pt is R handed   Lives in single story home with her husband and father-in-law   Has 4 children   Associates Degree x2   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   Social Drivers of Health   Financial Resource Strain: Low Risk  (04/27/2024)   Overall Financial Resource Strain (CARDIA)    Difficulty of Paying Living Expenses: Not hard at all  Food Insecurity: No Food Insecurity (04/27/2024)   Hunger Vital Sign    Worried About Running Out of Food in the Last Year: Never true    Ran Out of Food in the Last Year: Never true  Transportation Needs: No Transportation Needs (04/27/2024)   PRAPARE - Administrator, Civil Service (Medical): No    Lack of Transportation (Non-Medical): No  Physical Activity: Inactive (04/27/2024)   Exercise Vital Sign    Days of Exercise per Week: 0 days    Minutes of  Exercise per Session: 0 min  Stress: No Stress Concern Present (04/27/2024)   [employer] of Occupational Health - Occupational Stress Questionnaire    Feeling of Stress : Not at all  Social Connections: Socially Integrated (04/27/2024)   Social Connection and Isolation Panel [NHANES]    Frequency of Communication with Friends and Family: More than three times a week    Frequency of Social Gatherings with Friends and Family: Once a week    Attends Religious Services: More than 4 times per year    Active Member of [employer] or Organizations: Yes    Attends Engineer, structural: More than 4 times per year    Marital Status: Married    Tobacco Counseling Counseling given: Not Answered Tobacco comments: QUIT IN HER EARLY 20'S    Clinical Intake:  Pre-visit preparation completed: Yes  Pain : No/denies pain     BMI - recorded: 46.1 Nutritional Status: BMI > 30  Obese Nutritional Risks: None Diabetes: No  Lab Results  Component Value Date   HGBA1C 6.4 (H) 11/07/2023   HGBA1C 6.2 04/18/2023   HGBA1C 6.2 (H) 11/04/2022     How often do you need to have someone help you when you read instructions, pamphlets, or other written materials from your doctor or pharmacy?: 1 - Never  Interpreter Needed?: No  Information entered by :: Dellie Fergusson, LPN   Activities of Daily Living    04/27/2024    9:00 AM 04/23/2024    8:45 AM  In your present state of health, do you have any difficulty performing the following activities:  Hearing? 0 0  Vision? 0 0  Difficulty concentrating or making decisions? 0 0  Walking or climbing stairs? 0 0  Dressing or bathing? 0 0  Doing errands, shopping? 0 0  Preparing Food and eating ? N N  Using the Toilet? N N  In the past six months, have you accidently leaked urine? N N  Do you have problems with loss of bowel control? N N  Managing your Medications? N N  Managing your Finances? N N  Housekeeping or managing your Housekeeping? N  N    Patient Care Team: Carollynn Cirri, NP as PCP - General (Internal Medicine) Lucendia Rusk, MD as PCP - Cardiology (Cardiology)  Indicate any recent Medical Services you may have received from other than [facility] providers in the past year (date may be approximate).     Assessment:    This is a routine wellness examination for Maria Franklin.  Hearing/Vision screen Hearing Screening - Comments:: NO AIDS Vision Screening - Comments:: NO GLASSES- MY EYE DOCTOR   Goals Addressed             This Visit's Progress    DIET - INCREASE WATER  INTAKE         Depression Screen     04/27/2024    8:57 AM 11/07/2023    9:19 AM 04/22/2023    1:39 PM 04/18/2023    9:58 AM 11/04/2022    9:28 AM 11/04/2022    9:17 AM 08/28/2022   11:40 AM  PHQ 2/9 Scores  PHQ - 2 Score 0 0 0 3 2 2    PHQ- 9 Score 0  0 15 3 3       Information is confidential and restricted. Go to Review Flowsheets to unlock data.    Fall Risk     04/27/2024    9:00 AM 04/23/2024    8:45 AM 11/07/2023    9:19 AM 04/22/2023    1:41 PM 04/21/2023    2:48 PM  Fall Risk   Falls in the past year? 0 0 1 0 0  Number falls in past yr: 0 0 0 0 0  Injury with Fall? 0 0 0 0   Risk for fall due to : No Fall Risks   No Fall Risks   Follow up Falls evaluation completed   Falls prevention discussed;Falls evaluation completed     MEDICARE RISK AT HOME:  Medicare Risk at Home Any stairs in or around the home?: Yes If so, are there any without handrails?: No Home free of loose throw rugs in walkways, pet beds, electrical cords, etc?: No Adequate lighting in your home to reduce risk of falls?: Yes Life alert?: No Use of a cane, walker or w/c?: No Grab bars in the bathroom?: No Shower chair or bench in shower?: Yes Elevated toilet seat or a handicapped toilet?: No  TIMED UP AND GO:  Was the test performed?  No  Cognitive Function: 6CIT completed      02/07/2019    9:00 AM  Montreal Cognitive Assessment   Visuospatial/  Executive (0/5) 4  Naming (0/3) 3  Attention: Read list of digits (0/2) 2  Attention: Read list of letters (0/1) 1  Attention: Serial 7 subtraction starting at 100 (0/3) 2  Language: Repeat phrase (0/2) 2  Language : Fluency (0/1) 1  Abstraction (0/2) 2  Delayed Recall (0/5) 3  Orientation (0/6) 6  Total 26      04/22/2023    1:47 PM 04/06/2022    4:00 PM  6CIT Screen  What Year? 0 points 0 points  What month? 0 points 0 points  What time? 0 points 0 points  Count back from 20 0 points 0 points  Months in reverse 0 points 0 points  Repeat phrase 0 points 4 points  Total Score 0 points 4 points    Immunizations Immunization History  Administered Date(s) Administered   Influenza Inj Mdck Quad Pf 10/01/2020   Influenza, Seasonal, Injecte, Preservative Fre 11/07/2023   Influenza,inj,Quad PF,6+ Mos 10/21/2014, 10/28/2015, 11/04/2016, 02/14/2018, 09/28/2018, 09/22/2021, 11/04/2022   PFIZER(Purple Top)SARS-COV-2 Vaccination 02/29/2020, 03/22/2020, 10/01/2020   Pfizer Covid-19 Vaccine Bivalent Booster 50yrs & up 09/22/2021   Pneumococcal Polysaccharide-23 10/21/2014, 03/21/2019   Td 04/18/2016   Zoster Recombinant(Shingrix) 04/14/2022, 06/26/2022    Screening Tests Health Maintenance  Topic Date Due   Diabetic kidney evaluation - Urine ACR  04/17/2024   HEMOGLOBIN A1C  05/07/2024   INFLUENZA VACCINE  07/06/2024   Diabetic kidney evaluation - eGFR measurement  11/06/2024   FOOT EXAM  11/06/2024   Colonoscopy  12/21/2024   Cervical Cancer Screening (HPV/Pap Cotest)  12/24/2024   OPHTHALMOLOGY EXAM  02/21/2025   Medicare Annual Wellness (AWV)  04/27/2025   MAMMOGRAM  02/16/2026   DTaP/Tdap/Td (2 - Tdap) 04/18/2026   Hepatitis C Screening  Completed   HIV Screening  Completed   Zoster Vaccines- Shingrix  Completed   HPV VACCINES  Aged Out   Meningococcal B Vaccine  Aged Out   Pneumococcal Vaccine 39-28 Years old  Discontinued   COVID-19 Vaccine  Discontinued    Health  Maintenance  Health Maintenance Due  Topic Date Due   Diabetic kidney evaluation - Urine ACR  04/17/2024   Health Maintenance Items Addressed: UP TO DATE ON SHOTS EXCEPT WANTS NO MORE COVID, MAY NEED A PNA IN FALL; UP TO DATE ON MAMMOGRAM, COLONOSCOPY   Additional Screening:  Vision Screening: Recommended annual ophthalmology exams for early detection of glaucoma and other disorders of the eye.  Dental Screening: Recommended annual dental exams for proper oral hygiene  Community Resource Referral / Chronic Care Management: CRR required this visit?  No   CCM required this visit?  No   Plan:    I have personally reviewed and noted the following in the patient's chart:   Medical and social history Use of alcohol, tobacco or illicit drugs  Current medications and supplements including opioid prescriptions. Patient is not currently taking opioid prescriptions. Functional ability and status Nutritional status Physical activity Advanced directives List of other physicians Hospitalizations, surgeries, and ER visits in previous 12 months Vitals Screenings to include cognitive, depression, and falls Referrals and appointments  In addition, I have reviewed and discussed with patient certain preventive protocols, quality metrics, and best practice recommendations. A written personalized care plan for preventive services as well as general preventive health recommendations were provided to patient.   Pinky Bright, LPN   1/61/0960   After Visit Summary: (MyChart) Due to this being a telephonic visit, the after visit summary with patients personalized plan was offered to patient via MyChart   Notes: Nothing significant to report at this time.

## 2024-04-27 NOTE — Patient Instructions (Signed)
 [name] , Thank you for taking time out of your busy schedule to complete your Annual Wellness Visit with me. I enjoyed our conversation and look forward to speaking with you again next year. I, as well as your care team,  appreciate your ongoing commitment to your health goals. Please review the following plan we discussed and let me know if I can assist you in the future.  Follow up Visits: Next Medicare AWV with our clinical staff: 05/03/25 @ 8:10 AM BY PHONE   Have you seen your provider in the last 6 months (3 months if uncontrolled diabetes)? Yes   Clinician Recommendations:  Aim for 30 minutes of exercise or brisk walking, 6-8 glasses of water , and 5 servings of fruits and vegetables each day. TAKE CARE!      This is a list of the screening recommended for you and due dates:  Health Maintenance  Topic Date Due   Yearly kidney health urinalysis for diabetes  04/17/2024   Hemoglobin A1C  05/07/2024   Flu Shot  07/06/2024   Yearly kidney function blood test for diabetes  11/06/2024   Complete foot exam   11/06/2024   Colon Cancer Screening  12/21/2024   Pap with HPV screening  12/24/2024   Eye exam for diabetics  02/21/2025   Medicare Annual Wellness Visit  04/27/2025   Mammogram  02/16/2026   DTaP/Tdap/Td vaccine (2 - Tdap) 04/18/2026   Hepatitis C Screening  Completed   HIV Screening  Completed   Zoster (Shingles) Vaccine  Completed   HPV Vaccine  Aged Out   Meningitis B Vaccine  Aged Out   Pneumococcal Vaccination  Discontinued   COVID-19 Vaccine  Discontinued    Advanced directives: (ACP Link)Information on Advanced Care Planning can be found at [place]  Secretary of State Advance Health Care Directives Advance Health Care Directives. [email or web address]  Advance Care Planning is important because it:  [x]  Makes sure you receive the medical care that is consistent with your values, goals, and preferences  [x]  It provides guidance to your family and loved ones and reduces  their decisional burden about whether or not they are making the right decisions based on your wishes.  Follow the link provided in your after visit summary or read over the paperwork we have mailed to you to help you started getting your Advance Directives in place. If you need assistance in completing these, please reach out to us  so that we can help you!

## 2024-05-07 ENCOUNTER — Other Ambulatory Visit: Payer: Self-pay | Admitting: Internal Medicine

## 2024-05-07 ENCOUNTER — Encounter: Payer: Self-pay | Admitting: Internal Medicine

## 2024-05-07 ENCOUNTER — Ambulatory Visit (INDEPENDENT_AMBULATORY_CARE_PROVIDER_SITE_OTHER): Payer: Self-pay | Admitting: Internal Medicine

## 2024-05-07 VITALS — BP 112/70 | Ht 63.0 in | Wt 265.6 lb

## 2024-05-07 DIAGNOSIS — N1831 Chronic kidney disease, stage 3a: Secondary | ICD-10-CM | POA: Diagnosis not present

## 2024-05-07 DIAGNOSIS — J4540 Moderate persistent asthma, uncomplicated: Secondary | ICD-10-CM | POA: Diagnosis not present

## 2024-05-07 DIAGNOSIS — G4733 Obstructive sleep apnea (adult) (pediatric): Secondary | ICD-10-CM | POA: Diagnosis not present

## 2024-05-07 DIAGNOSIS — E1169 Type 2 diabetes mellitus with other specified complication: Secondary | ICD-10-CM

## 2024-05-07 DIAGNOSIS — E785 Hyperlipidemia, unspecified: Secondary | ICD-10-CM

## 2024-05-07 DIAGNOSIS — D509 Iron deficiency anemia, unspecified: Secondary | ICD-10-CM | POA: Insufficient documentation

## 2024-05-07 DIAGNOSIS — E039 Hypothyroidism, unspecified: Secondary | ICD-10-CM

## 2024-05-07 DIAGNOSIS — F200 Paranoid schizophrenia: Secondary | ICD-10-CM

## 2024-05-07 DIAGNOSIS — K122 Cellulitis and abscess of mouth: Secondary | ICD-10-CM | POA: Diagnosis not present

## 2024-05-07 DIAGNOSIS — I10 Essential (primary) hypertension: Secondary | ICD-10-CM | POA: Diagnosis not present

## 2024-05-07 DIAGNOSIS — Z7985 Long-term (current) use of injectable non-insulin antidiabetic drugs: Secondary | ICD-10-CM | POA: Diagnosis not present

## 2024-05-07 DIAGNOSIS — E1122 Type 2 diabetes mellitus with diabetic chronic kidney disease: Secondary | ICD-10-CM | POA: Diagnosis not present

## 2024-05-07 DIAGNOSIS — K219 Gastro-esophageal reflux disease without esophagitis: Secondary | ICD-10-CM | POA: Diagnosis not present

## 2024-05-07 MED ORDER — TIRZEPATIDE 2.5 MG/0.5ML ~~LOC~~ SOAJ: 2.5000 mg | SUBCUTANEOUS | Status: DC

## 2024-05-07 MED ORDER — ADVAIR DISKUS 250-50 MCG/ACT IN AEPB: 1.0000 | INHALATION_SPRAY | Freq: Two times a day (BID) | RESPIRATORY_TRACT | 1 refills | Status: DC

## 2024-05-07 MED ORDER — OMEPRAZOLE 20 MG PO CPDR
20.0000 mg | DELAYED_RELEASE_CAPSULE | Freq: Every day | ORAL | 1 refills | Status: DC
Start: 2024-05-07 — End: 2024-10-26

## 2024-05-07 MED ORDER — FLUTICASONE PROPIONATE 50 MCG/ACT NA SUSP: NASAL | 5 refills | Status: DC

## 2024-05-07 NOTE — Assessment & Plan Note (Signed)
 A1c and urine microalbumin today Encouraged low-carb diet and exercise for weight loss Sample of Mounjaro 2.5 mg weekly given, will send Mounjaro 5 mg weekly to her insurance Routine eye exam Encouraged routine foot exam Encouraged her to get a flu shot in the fall Pneumovax UTD Encouraged her to get her COVID booster

## 2024-05-07 NOTE — Assessment & Plan Note (Signed)
 Avoid eating late at night Encourage weight loss as this can help reduce reflux symptoms Will try to wean omeprazole  to 20 mg daily

## 2024-05-07 NOTE — Assessment & Plan Note (Signed)
Stable on sertraline and haloperidol She will continue to follow with psychiatry

## 2024-05-07 NOTE — Assessment & Plan Note (Signed)
Controlled on amlodipine, carvedilol, hydralazine and lisinopril Reinforced DASH diet and exercise for weight loss C-Met today

## 2024-05-07 NOTE — Patient Instructions (Signed)

## 2024-05-07 NOTE — Progress Notes (Signed)
 Subjective:    Patient ID: Maria Franklin, female    DOB: November 28, 1961, 63 y.o.   MRN: [id number]  HPI  Patient presents to clinic today for 97-month follow-up of chronic conditions.  HTN: Her BP today is 116/82.  She is taking amlodipine , carvedilol , hydralazine  and lisinopril  as prescribed.  ECG from 11/2020 reviewed.  HLD with ludwigs angina: Her last LDL was 73, triglycerides 96, 11/2023.  She denies myalgias on atorvastatin .  She is taking aspirin  daily.  She tries to consume a low-fat diet.  GERD: Triggered by eating late at night.  She occasionally has breakthrough omeprazole  for which she takes tums with good relief of symptoms.  There is no upper GI on file.  Asthma: She denies chronic cough but has shortness of breath.  She is taking advair  and albuterol  as prescribed.  PFTs from 09/2022 reviewed.  She follows with pulmonology.  OSA: She averages 8 hours of sleep per night without the use of her CPAP.  Sleep study from 10/2019 reviewed.  Hypothyroidism: She denies any issues on her current dose of levothyroxine .  She does not follow with endocrinology.  Iron deficiency anemia: Her last H/H was 10.8/43.1, 11/2023.  She is taking oral iron as prescribed.  She does not follow with hematology.  DM2: Her last A1c was 6.4%, 11/2023.  She is not currently taking any oral diabetic medication.  She does not check her sugars.  She checks her feet routinely.  Her last eye exam was 02/2024.  Flu 11/2023.  Pneumovax 03/2019.  COVID Pfizer x 3.  CKD 3: Her last creatinine was 1.39, GFR 43, 11/2023.  She is taking lisinopril  for renal protection.  She follows with nephrology.  Paranoid schizophrenia: Managed with haloperidol  and sertraline .  She follows with psychiatry.  She also reports intermittent right elbow pain.  She denies any injury to the area.  She thinks she may have arthritis.  She is not taking anything OTC for this.  Review of Systems     Past Medical History:  Diagnosis Date    Allergy    Anemia    Angio-edema    Asthma    ALL THE TIME   LAST FLARE UP 12/2018   Chest pain 08/2019   Chicken pox    Chronic kidney disease    Diabetes mellitus without complication (HCC)    GERD (gastroesophageal reflux disease)    Hyperlipidemia    Hypertension    Hyperthyroidism    Recurrent upper respiratory infection (URI)    Schizophrenia (HCC)    Sleep apnea    DX 2-3 YRS AGO.   Sleep apnea    Thyroid  disease    Urticaria     Current Outpatient Medications  Medication Sig Dispense Refill   ADVAIR  DISKUS 250-50 MCG/ACT AEPB Inhale 1 puff into the lungs in the morning and at bedtime. 60 each 5   albuterol  (PROVENTIL ) (2.5 MG/3ML) 0.083% nebulizer solution Take 3 mLs (2.5 mg total) by nebulization every 4 (four) hours as needed for wheezing or shortness of breath. 75 mL 1   albuterol  (VENTOLIN  HFA) 108 (90 Base) MCG/ACT inhaler INHALE 1 TO 2 PUFFS INTO THE LUNGS EVERY 6 HOURS AS NEEDED WHEEZING/SHORTNESS OF BREATH 18 each 1   amLODipine  (NORVASC ) 10 MG tablet TAKE 1 TABLET BY MOUTH EVERY DAY 90 tablet 1   aspirin  EC 81 MG EC tablet Take 1 tablet (81 mg total) by mouth daily. 30 tablet 0   atorvastatin  (LIPITOR) 40 MG tablet TAKE  1 TABLET BY MOUTH EVERY DAY AT 6PM 90 tablet 0   b complex vitamins capsule Take 1 capsule by mouth daily.     Boswellia-Glucosamine-Vit D (OSTEO BI-FLEX ONE PER DAY PO) Take by mouth.     carvedilol  (COREG ) 12.5 MG tablet TAKE 1 TABLET BY MOUTH TWICE A DAY WITH FOOD 180 tablet 1   cetirizine  (ZYRTEC ) 10 MG tablet Take 1 tablet (10 mg total) by mouth daily. 30 tablet 5   Ferrous Sulfate  (IRON) 325 (65 Fe) MG TABS Take 1 tablet by mouth every morning.     fluticasone  (FLONASE ) 50 MCG/ACT nasal spray SPRAY 2 SPRAYS INTO EACH NOSTRIL EVERY DAY 16 mL 5   haloperidol  (HALDOL ) 5 MG tablet TAKE 0.5 TABLETS (2.5 MG TOTAL) BY MOUTH AT BEDTIME. 45 tablet 1   hydrALAZINE  (APRESOLINE ) 25 MG tablet TAKE 1/2 TABLET BY MOUTH EVERY MORNING AND AT BEDTIME 90  tablet 3   levothyroxine  (SYNTHROID ) 50 MCG tablet TAKE 1 TABLET BY MOUTH DAILY BEFORE BREAKFAST 90 tablet 3   lisinopril  (ZESTRIL ) 40 MG tablet TAKE 1 TABLET BY MOUTH EVERY DAY 30 tablet 0   Multiple Vitamins-Minerals (MULTIVITAMIN WITH MINERALS) tablet Take 1 tablet by mouth every evening.     omeprazole  (PRILOSEC) 40 MG capsule TAKE 1 CAPSULE BY MOUTH EVERY DAY 90 capsule 3   sertraline  (ZOLOFT ) 50 MG tablet Take 1 tablet (50 mg total) by mouth daily. 90 tablet 1   No current facility-administered medications for this visit.    Allergies  Allergen Reactions   Bee Pollen Cough   Pollen Extract Cough   Latex Itching, Rash and Other (See Comments)    Severe itching    Family History  Problem Relation Age of Onset   Mental illness Mother    Hypertension Mother    Schizophrenia Mother    Depression Mother    Heart disease Mother    Diabetes Mother    Cancer Father        LUNG   Hypertension Sister    Cancer Sister        BREAST   Breast cancer Sister    Hypertension Sister    Heart attack Sister    Hypertension Sister    Heart attack Sister    Mental illness Brother    Hypertension Brother    Schizophrenia Brother    Depression Brother    Hypertension Brother    Schizophrenia Brother    Hypertension Brother    Breast cancer Maternal Aunt    Schizophrenia Maternal Uncle    Heart disease Maternal Grandmother    Depression Maternal Grandmother    Schizophrenia Maternal Grandmother    Allergic rhinitis Neg Hx    Angioedema Neg Hx    Asthma Neg Hx    Atopy Neg Hx    Eczema Neg Hx    Immunodeficiency Neg Hx    Urticaria Neg Hx    Colon cancer Neg Hx    Colon polyps Neg Hx    Stomach cancer Neg Hx    Rectal cancer Neg Hx     Social History   Socioeconomic History   Marital status: Married    Spouse name: Zora Hires   Number of children: 4   Years of education: Associate Degree   Highest education level: Associate degree: occupational, Scientist, product/process development, or  vocational program  Occupational History   Occupation: unemployed  Tobacco Use   Smoking status: Former    Current packs/day: 0.00    Average packs/day: 0.3 packs/day  for 6.0 years (1.5 ttl pk-yrs)    Types: Cigarettes    Start date: 62    Quit date: 55    Years since quitting: 38.4   Smokeless tobacco: Never   Tobacco comments:    QUIT IN HER EARLY 20'S  Vaping Use   Vaping status: Never Used  Substance and Sexual Activity   Alcohol use: No    Alcohol/week: 0.0 standard drinks of alcohol   Drug use: No   Sexual activity: Not Currently  Other Topics Concern   Not on file  Social History Narrative   Pt is R handed   Lives in single story home with her husband and father-in-law   Has 4 children   Associates Degree x2   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   Social Drivers of Health   Financial Resource Strain: Low Risk  (04/27/2024)   Overall Financial Resource Strain (CARDIA)    Difficulty of Paying Living Expenses: Not hard at all  Food Insecurity: No Food Insecurity (04/27/2024)   Hunger Vital Sign    Worried About Running Out of Food in the Last Year: Never true    Ran Out of Food in the Last Year: Never true  Transportation Needs: No Transportation Needs (04/27/2024)   PRAPARE - Administrator, Civil Service (Medical): No    Lack of Transportation (Non-Medical): No  Physical Activity: Inactive (04/27/2024)   Exercise Vital Sign    Days of Exercise per Week: 0 days    Minutes of Exercise per Session: 0 min  Stress: No Stress Concern Present (04/27/2024)   [employer] of Occupational Health - Occupational Stress Questionnaire    Feeling of Stress : Not at all  Social Connections: Socially Integrated (04/27/2024)   Social Connection and Isolation Panel [NHANES]    Frequency of Communication with Friends and Family: More than three times a week    Frequency of Social Gatherings with Friends and Family:  Once a week    Attends Religious Services: More than 4 times per year    Active Member of [employer] or Organizations: Yes    Attends Engineer, structural: More than 4 times per year    Marital Status: Married  Catering manager Violence: Not At Risk (04/27/2024)   Humiliation, Afraid, Rape, and Kick questionnaire    Fear of Current or Ex-Partner: No    Emotionally Abused: No    Physically Abused: No    Sexually Abused: No     Constitutional: Denies fever, malaise, fatigue, headache or abrupt weight changes.  HEENT: Denies eye pain, eye redness, ear pain, ringing in the ears, wax buildup, runny nose, nasal congestion, bloody nose, or sore throat. Respiratory: Pt reports intermittent shortness of breath. Denies difficulty breathing, cough or sputum production.   Cardiovascular: Denies chest pain, chest tightness, palpitations or swelling in the hands or feet.  Gastrointestinal: Denies abdominal pain, bloating, constipation, diarrhea or blood in the stool.  GU: Denies urgency, frequency, pain with urination, burning sensation, blood in urine, odor or discharge. Musculoskeletal: Pt reports right elbow pain. Denies decrease in range of motion, difficulty with gait, muscle pain or joint swelling.  Skin: Denies redness, rashes, lesions or ulcercations.  Neurological: Denies dizziness, difficulty with memory, difficulty with speech or problems with balance and coordination.  Psych: Denies anxiety, depression, SI/HI.  No other specific complaints in a complete review of systems (except as listed in HPI above).  Objective:  Physical Exam   BP 112/70 (BP Location: Left Arm, Patient Position: Sitting, Cuff Size: Large)   Ht 5\' 3"  (1.6 m)   Wt 265 lb 9.6 oz (120.5 kg)   LMP 02/02/2017 (Approximate)   BMI 47.05 kg/m    Wt Readings from Last 3 Encounters:  11/07/23 260 lb 3.2 oz (118 kg)  04/22/23 252 lb (114.3 kg)  04/18/23 252 lb (114.3 kg)    General: Appears her stated age,  obese, in NAD. Skin: Warm, dry and intact. No ulcerations noted. HEENT: Head: normal shape and size; Eyes: sclera white, no icterus, conjunctiva pink, PERRLA and EOMs intact;  Neck:  Neck supple, trachea midline. No masses, lumps present.  Thyromegaly noted. Cardiovascular: Normal rate and rhythm. S1,S2 noted.  No murmur, rubs or gallops noted. No JVD or BLE edema. No carotid bruits noted. Pulmonary/Chest: Normal effort and positive vesicular breath sounds. No respiratory distress. No wheezes, rales or ronchi noted.  Abdomen: . Normal bowel sounds.  Musculoskeletal: No difficulty with gait.  Neurological: Alert and oriented. Cranial nerves II-XII grossly intact. Coordination normal.  Psychiatric: Mood and affect normal. Behavior is normal. Judgment and thought content normal.     BMET    Component Value Date/Time   NA 141 11/07/2023 0933   K 4.2 11/07/2023 0933   CL 106 11/07/2023 0933   CO2 28 11/07/2023 0933   GLUCOSE 114 (H) 11/07/2023 0933   BUN 22 11/07/2023 0933   CREATININE 1.39 (H) 11/07/2023 0933   CALCIUM  9.5 11/07/2023 0933   GFRNONAA 44 (L) 11/18/2020 0939   GFRNONAA 53 (L) 07/28/2020 0942   GFRAA 61 07/28/2020 0942    Lipid Panel     Component Value Date/Time   CHOL 168 11/07/2023 0933   TRIG 96 11/07/2023 0933   HDL 77 11/07/2023 0933   CHOLHDL 2.2 11/07/2023 0933   VLDL 18 10/17/2019 0630   LDLCALC 73 11/07/2023 0933    CBC    Component Value Date/Time   WBC 6.9 11/07/2023 0933   RBC 3.87 11/07/2023 0933   HGB 10.8 (L) 11/07/2023 0933   HCT 34.1 (L) 11/07/2023 0933   PLT 233 11/07/2023 0933   MCV 88.1 11/07/2023 0933   MCH 27.9 11/07/2023 0933   MCHC 31.7 (L) 11/07/2023 0933   RDW 12.6 11/07/2023 0933   LYMPHSABS 2.6 11/18/2020 0939   MONOABS 0.5 11/18/2020 0939   EOSABS 0.4 11/18/2020 0939   BASOSABS 0.0 11/18/2020 0939    Hgb A1C Lab Results  Component Value Date   HGBA1C 6.4 (H) 11/07/2023           Assessment & Plan:       RTC in 6 months for your annual exam Helayne Lo, NP

## 2024-05-07 NOTE — Assessment & Plan Note (Signed)
 Asymptomatic.

## 2024-05-07 NOTE — Assessment & Plan Note (Signed)
 C-Met today Continue lisinopril  for renal protection She will continue to follow with nephrology

## 2024-05-07 NOTE — Assessment & Plan Note (Signed)
 Stable on advair  and albuterol  She will continue to follow with pulmonology

## 2024-05-07 NOTE — Assessment & Plan Note (Signed)
C-Met and lipid profile today Encouraged her to consume a low-fat diet Continue atorvastatin 

## 2024-05-07 NOTE — Assessment & Plan Note (Signed)
Noncompliant with CPAP use Encouraged weight loss as this can help reduce sleep apnea symptoms 

## 2024-05-07 NOTE — Assessment & Plan Note (Signed)
 TSH and free T4 today We will adjust levothyroxine if needed based on labs

## 2024-05-07 NOTE — Assessment & Plan Note (Signed)
CBC today Continue oral iron 

## 2024-05-07 NOTE — Assessment & Plan Note (Signed)
 Encourage diet and exercise for weight loss

## 2024-05-08 ENCOUNTER — Ambulatory Visit: Payer: Self-pay | Admitting: Internal Medicine

## 2024-05-08 LAB — COMPREHENSIVE METABOLIC PANEL WITH GFR
AG Ratio: 1.7 (calc) (ref 1.0–2.5)
ALT: 18 U/L (ref 6–29)
AST: 20 U/L (ref 10–35)
Albumin: 4.2 g/dL (ref 3.6–5.1)
Alkaline phosphatase (APISO): 86 U/L (ref 37–153)
BUN/Creatinine Ratio: 16 (calc) (ref 6–22)
BUN: 19 mg/dL (ref 7–25)
CO2: 28 mmol/L (ref 20–32)
Calcium: 9.5 mg/dL (ref 8.6–10.4)
Chloride: 104 mmol/L (ref 98–110)
Creat: 1.18 mg/dL — ABNORMAL HIGH (ref 0.50–1.05)
Globulin: 2.5 g/dL (ref 1.9–3.7)
Glucose, Bld: 114 mg/dL — ABNORMAL HIGH (ref 65–99)
Potassium: 4.6 mmol/L (ref 3.5–5.3)
Sodium: 140 mmol/L (ref 135–146)
Total Bilirubin: 0.3 mg/dL (ref 0.2–1.2)
Total Protein: 6.7 g/dL (ref 6.1–8.1)
eGFR: 52 mL/min/{1.73_m2} — ABNORMAL LOW (ref >=60)

## 2024-05-08 LAB — CBC
HCT: 33.3 % — ABNORMAL LOW (ref 35.0–45.0)
Hemoglobin: 10.5 g/dL — ABNORMAL LOW (ref 11.7–15.5)
MCH: 27.9 pg (ref 27.0–33.0)
MCHC: 31.5 g/dL — ABNORMAL LOW (ref 32.0–36.0)
MCV: 88.6 fL (ref 80.0–100.0)
MPV: 9.7 fL (ref 7.5–12.5)
Platelets: 226 10*3/uL (ref 140–400)
RBC: 3.76 10*6/uL — ABNORMAL LOW (ref 3.80–5.10)
RDW: 12.8 % (ref 11.0–15.0)
WBC: 6.9 10*3/uL (ref 3.8–10.8)

## 2024-05-08 LAB — LIPID PANEL
Cholesterol: 145 mg/dL (ref <200)
HDL: 73 mg/dL (ref >=50)
LDL Cholesterol (Calc): 56 mg/dL
Non-HDL Cholesterol (Calc): 72 mg/dL (ref <130)
Total CHOL/HDL Ratio: 2 (calc) (ref <5.0)
Triglycerides: 77 mg/dL (ref <150)

## 2024-05-08 LAB — MICROALBUMIN / CREATININE URINE RATIO
Creatinine, Urine: 122 mg/dL (ref 20–275)
Microalb Creat Ratio: 3 mg/g{creat} (ref <30)
Microalb, Ur: 0.4 mg/dL

## 2024-05-08 LAB — T4, FREE: Free T4: 1 ng/dL (ref 0.8–1.8)

## 2024-05-08 LAB — TSH: TSH: 2.81 m[IU]/L (ref 0.40–4.50)

## 2024-05-08 LAB — HEMOGLOBIN A1C
Hgb A1c MFr Bld: 6.3 % — ABNORMAL HIGH (ref <5.7)
Mean Plasma Glucose: 134 mg/dL
eAG (mmol/L): 7.4 mmol/L

## 2024-05-08 NOTE — Telephone Encounter (Signed)
 Requested medication (s) are due for refill today:   Pharmacy requesting an alternative.  Advair  not covered.  Requested medication (s) are on the active medication list:   N/A  Future visit scheduled:   Yes 11/07/2024   Last ordered: Alternative being requested    See pharmacy message.   Requested Prescriptions  Pending Prescriptions Disp Refills   WIXELA INHUB 250-50 MCG/ACT AEPB [Pharmacy Med Name: WIXELA 250-50 INHUB]  0     Pulmonology:  Combination Products Passed - 05/08/2024 11:40 AM      Passed - Valid encounter within last 12 months    Recent Outpatient Visits           Yesterday Type 2 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 disease, without long-term current use of insulin  [facility])   [place] [facility] [place], [place], [place]

## 2024-05-09 ENCOUNTER — Encounter: Payer: Self-pay | Admitting: Internal Medicine

## 2024-05-14 ENCOUNTER — Encounter (HOSPITAL_COMMUNITY): Payer: Self-pay | Admitting: Psychiatry

## 2024-05-14 ENCOUNTER — Telehealth (HOSPITAL_BASED_OUTPATIENT_CLINIC_OR_DEPARTMENT_OTHER): Payer: Medicare HMO | Admitting: Psychiatry

## 2024-05-14 DIAGNOSIS — F411 Generalized anxiety disorder: Secondary | ICD-10-CM | POA: Diagnosis not present

## 2024-05-14 DIAGNOSIS — F200 Paranoid schizophrenia: Secondary | ICD-10-CM | POA: Diagnosis not present

## 2024-05-14 MED ORDER — SERTRALINE HCL 50 MG PO TABS: 50.0000 mg | ORAL_TABLET | Freq: Every day | ORAL | 1 refills | Status: DC

## 2024-05-14 MED ORDER — HALOPERIDOL 5 MG PO TABS: ORAL_TABLET | ORAL | 1 refills | Status: DC

## 2024-05-14 NOTE — Progress Notes (Signed)
 [facility] MD Virtual Progress Note   Patient Location: Home Provider Location: Home Office  I connect with patient by video and verified that I am speaking with correct person by using two identifiers. I discussed the limitations of evaluation and management by telemedicine and the availability of in person appointments. I also discussed with the patient that there may be a patient responsible charge related to this service. The patient expressed understanding and agreed to proceed.  Maria Franklin [id number] 63 y.o.  05/14/2024 2:04 PM  History of Present Illness:  Patient is evaluated by video session.  She is compliant with Zoloft  and Haldol  which is keeping her mood stable.  She denies any paranoia, his sedation, agitation or any crying spells.  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  She can only drive short distance.  She tried to spend time at church activities and sometimes she see her grandkids.  She has 96-year-old, 103-year-old and 98-year-old.  Her daughter live 2 hours away but her son lives close by.  Her husband is very supportive.  Patient denies any issues with the medication.  She sleeps good.  She is now taking Mounjaro  as she gained weight and wanted to have a better control on her blood sugar.  Her last hemoglobin A1c 6.3.  Her creatinine is 1.18.  Patient denies drinking or using any illegal substances.  Patient denies any panic attack and does not get overwhelmed.  Past Psychiatric History: H/O paranoia and delusions. H/O overdose and inpatient at [facility]. H/O overdose and inpatient at [facility]. Readmitted in 4 months due to noncompliant with medication. Last admission at [facility] in Nov 2020. Abilify worked but could not afford. No h/o abuse.      Outpatient Encounter Medications as of 05/14/2024  Medication Sig   ADVAIR  DISKUS 250-50 MCG/ACT AEPB Inhale 1 puff into the lungs in the morning and at  bedtime.   albuterol  (PROVENTIL ) (2.5 MG/3ML) 0.083% nebulizer solution Take 3 mLs (2.5 mg total) by nebulization every 4 (four) hours as needed for wheezing or shortness of breath.   albuterol  (VENTOLIN  HFA) 108 (90 Base) MCG/ACT inhaler INHALE 1 TO 2 PUFFS INTO THE LUNGS EVERY 6 HOURS AS NEEDED WHEEZING/SHORTNESS OF BREATH   amLODipine  (NORVASC ) 10 MG tablet TAKE 1 TABLET BY MOUTH EVERY DAY   aspirin  EC 81 MG EC tablet Take 1 tablet (81 mg total) by mouth daily.   atorvastatin  (LIPITOR) 40 MG tablet TAKE 1 TABLET BY MOUTH EVERY DAY AT 6PM   b complex vitamins capsule Take 1 capsule by mouth daily.   Boswellia-Glucosamine-Vit D (OSTEO BI-FLEX ONE PER DAY PO) Take by mouth.   carvedilol  (COREG ) 12.5 MG tablet TAKE 1 TABLET BY MOUTH TWICE A DAY WITH FOOD   cetirizine  (ZYRTEC ) 10 MG tablet Take 1 tablet (10 mg total) by mouth daily.   Ferrous Sulfate  (IRON) 325 (65 Fe) MG TABS Take 1 tablet by mouth every morning.   fluticasone  (FLONASE ) 50 MCG/ACT nasal spray SPRAY 2 SPRAYS INTO EACH NOSTRIL EVERY DAY   haloperidol  (HALDOL ) 5 MG tablet TAKE 0.5 TABLETS (2.5 MG TOTAL) BY MOUTH AT BEDTIME.   hydrALAZINE  (APRESOLINE ) 25 MG tablet TAKE 1/2 TABLET BY MOUTH EVERY MORNING AND AT BEDTIME   levothyroxine  (SYNTHROID ) 50 MCG tablet TAKE 1 TABLET BY MOUTH DAILY BEFORE BREAKFAST   lisinopril  (ZESTRIL ) 40 MG tablet TAKE 1 TABLET BY MOUTH EVERY DAY   Multiple Vitamins-Minerals (  MULTIVITAMIN WITH MINERALS) tablet Take 1 tablet by mouth every evening.   omeprazole  (PRILOSEC) 20 MG capsule Take 1 capsule (20 mg total) by mouth daily.   sertraline  (ZOLOFT ) 50 MG tablet Take 1 tablet (50 mg total) by mouth daily.   tirzepatide  (MOUNJARO ) 2.5 MG/0.5ML Pen Inject 2.5 mg into the skin once a week.   No facility-administered encounter medications on file as of 05/14/2024.    Recent Results (from the past 2160 hours)  HM DIABETES EYE EXAM     Status: None   Collection Time: 02/22/24 11:11 AM  Result Value Ref Range    HM Diabetic Eye Exam No Retinopathy No Retinopathy    Comment: ABS BY HIM  CBC     Status: Abnormal   Collection Time: 05/07/24  9:19 AM  Result Value Ref Range   WBC 6.9 3.8 - 10.8 Thousand/uL   RBC 3.76 (L) 3.80 - 5.10 Million/uL   Hemoglobin 10.5 (L) 11.7 - 15.5 g/dL   HCT 04.5 (L) 40.9 - 81.1 %   MCV 88.6 80.0 - 100.0 fL   MCH 27.9 27.0 - 33.0 pg   MCHC 31.5 (L) 32.0 - 36.0 g/dL    Comment: For adults, a slight decrease in the calculated MCHC value (in the range of 30 to 32 g/dL) is most likely not clinically significant; however, it should be interpreted with caution in correlation with other red cell parameters and the patient's clinical condition.    RDW 12.8 11.0 - 15.0 %   Platelets 226 140 - 400 Thousand/uL   MPV 9.7 7.5 - 12.5 fL  Comprehensive metabolic panel with GFR     Status: Abnormal   Collection Time: 05/07/24  9:19 AM  Result Value Ref Range   Glucose, Bld 114 (H) 65 - 99 mg/dL    Comment: .            Fasting reference interval . For someone without known diabetes, a glucose value between 100 and 125 mg/dL is consistent with prediabetes and should be confirmed with a follow-up test. .    BUN 19 7 - 25 mg/dL   Creat 9.14 (H) 7.82 - 1.05 mg/dL   eGFR 52 (L) > OR = 60 mL/min/1.39m2   BUN/Creatinine Ratio 16 6 - 22 (calc)   Sodium 140 135 - 146 mmol/L   Potassium 4.6 3.5 - 5.3 mmol/L   Chloride 104 98 - 110 mmol/L   CO2 28 20 - 32 mmol/L   Calcium  9.5 8.6 - 10.4 mg/dL   Total Protein 6.7 6.1 - 8.1 g/dL   Albumin 4.2 3.6 - 5.1 g/dL   Globulin 2.5 1.9 - 3.7 g/dL (calc)   AG Ratio 1.7 1.0 - 2.5 (calc)   Total Bilirubin 0.3 0.2 - 1.2 mg/dL   Alkaline phosphatase (APISO) 86 37 - 153 U/L   AST 20 10 - 35 U/L   ALT 18 6 - 29 U/L  TSH     Status: None   Collection Time: 05/07/24  9:19 AM  Result Value Ref Range   TSH 2.81 0.40 - 4.50 mIU/L  T4, free     Status: None   Collection Time: 05/07/24  9:19 AM  Result Value Ref Range   Free T4 1.0 0.8 -  1.8 ng/dL  Lipid panel     Status: None   Collection Time: 05/07/24  9:19 AM  Result Value Ref Range   Cholesterol 145 <200 mg/dL   HDL 73 > OR = 50 mg/dL  Triglycerides 77 <150 mg/dL   LDL Cholesterol (Calc) 56 mg/dL (calc)    Comment: Reference range: <100 . Desirable range <100 mg/dL for primary prevention;   <70 mg/dL for patients with CHD or diabetic patients  with > or = 2 CHD risk factors. Aaron Aas LDL-C is now calculated using the Martin-Hopkins  calculation, which is a validated novel method providing  better accuracy than the Friedewald equation in the  estimation of LDL-C.  Melinda Sprawls et al. Erroll Heard. 1610;960(45): 2061-2068  (http://education.[facility].com/faq/FAQ164)    Total CHOL/HDL Ratio 2.0 <5.0 (calc)   Non-HDL Cholesterol (Calc) 72 <409 mg/dL (calc)    Comment: For patients with diabetes plus 1 major ASCVD risk  factor, treating to a non-HDL-C goal of <100 mg/dL  (LDL-C of <81 mg/dL) is considered a therapeutic  option.   Hemoglobin A1c     Status: Abnormal   Collection Time: 05/07/24  9:19 AM  Result Value Ref Range   Hgb A1c MFr Bld 6.3 (H) <5.7 %    Comment: For someone without known diabetes, a hemoglobin  A1c value between 5.7% and 6.4% is consistent with prediabetes and should be confirmed with a  follow-up test. . For someone with known diabetes, a value <7% indicates that their diabetes is well controlled. A1c targets should be individualized based on duration of diabetes, age, comorbid conditions, and other considerations. . This assay result is consistent with an increased risk of diabetes. . Currently, no consensus exists regarding use of hemoglobin A1c for diagnosis of diabetes for children. .    Mean Plasma Glucose 134 mg/dL   eAG (mmol/L) 7.4 mmol/L  Microalbumin / creatinine urine ratio     Status: None   Collection Time: 05/07/24  9:19 AM  Result Value Ref Range   Creatinine, Urine 122 20 - 275 mg/dL   Microalb, Ur 0.4 mg/dL     Comment: Reference Range Not established    Microalb Creat Ratio 3 <30 mg/g creat    Comment: . The ADA defines abnormalities in albumin excretion as follows: Aaron Aas Albuminuria Category        Result (mg/g creatinine) . Normal to Mildly increased   <30 Moderately increased         30-299  Severely increased           > OR = 300 . The ADA recommends that at least two of three specimens collected within a 3-6 month period be abnormal before considering a patient to be within a diagnostic category.      Psychiatric Specialty Exam: Physical Exam  Review of Systems  Weight 265 lb (120.2 kg), last menstrual period 02/02/2017.There is no height or weight on file to calculate BMI.  General Appearance: Casual  Eye Contact:  Good  Speech:  Clear and Coherent and Normal Rate  Volume:  Normal  Mood:  Euthymic  Affect:  Appropriate  Thought Process:  Goal Directed  Orientation:  Full (Time, Place, and Person)  Thought Content:  WDL  Suicidal Thoughts:  No  Homicidal Thoughts:  No  Memory:  Immediate;   Good Recent;   Good Remote;   Good  Judgement:  Good  Insight:  Present  Psychomotor Activity:  Normal  Concentration:  Concentration: Good and Attention Span: Good  Recall:  Good  Fund of Knowledge:  Good  Language:  Good  Akathisia:  No  Handed:  Right  AIMS (if indicated):     Assets:  Communication Skills Desire for Improvement Housing Transportation  ADL's:  Intact  Cognition:  WNL  Sleep:  ok       05/07/2024    9:00 AM 04/27/2024    8:57 AM 11/07/2023    9:19 AM 04/22/2023    1:39 PM 04/18/2023    9:58 AM  Depression screen PHQ 2/9  Decreased Interest 0 0 0 0 3  Down, Depressed, Hopeless 0 0 0 0 0  PHQ - 2 Score 0 0 0 0 3  Altered sleeping 0 0  0 3  Tired, decreased energy 3 0  0 3  Change in appetite  0  0 0  Feeling bad or failure about yourself  0 0  0 3  Trouble concentrating 0 0  0 3  Moving slowly or fidgety/restless 0 0  0 0  Suicidal thoughts 0 0  0 0   PHQ-9 Score 3 0  0 15  Difficult doing work/chores Somewhat difficult Not difficult at all  Not difficult at all Extremely dIfficult    Assessment/Plan: Paranoid schizophrenia, chronic condition (HCC) - Plan: haloperidol  (HALDOL ) 5 MG tablet, sertraline  (ZOLOFT ) 50 MG tablet  GAD (generalized anxiety disorder) - Plan: sertraline  (ZOLOFT ) 50 MG tablet  Patient is stable on her current medication.  Continue Haldol  2.5 mg at bedtime and Zoloft  50 mg daily.  Reviewed blood work results.  Hemoglobin A1c 6.3.  Encourage hydration and watching her calorie intake.  She is now on Mounjaro  started 2 weeks ago.  Discussed medication side effects and benefits.  Recommend to call us  back if she has any question or any concern.  Follow-up in 6 months.   Follow Up Instructions:     I discussed the assessment and treatment plan with the patient. The patient was provided an opportunity to ask questions and all were answered. The patient agreed with the plan and demonstrated an understanding of the instructions.   The patient was advised to call back or seek an in-person evaluation if the symptoms worsen or if the condition fails to improve as anticipated.    Collaboration of Care: Other provider involved in patient's care AEB notes are available in epic to review  Patient/Guardian was advised Release of Information must be obtained prior to any record release in order to collaborate their care with an outside provider. Patient/Guardian was advised if they have not already done so to contact the registration department to sign all necessary forms in order for us  to release information regarding their care.   Consent: Patient/Guardian gives verbal consent for treatment and assignment of benefits for services provided during this visit. Patient/Guardian expressed understanding and agreed to proceed.     Total encounter time 18 minutes which includes face-to-face time, chart reviewed, care coordination,  order entry and documentation during this encounter.   Note: This document was prepared by [employer] voice dictation technology and any errors that results from this process are unintentional.    Arturo Late, MD 05/14/2024

## 2024-05-17 ENCOUNTER — Ambulatory Visit

## 2024-05-17 ENCOUNTER — Telehealth: Payer: Self-pay

## 2024-05-17 NOTE — Progress Notes (Signed)
 Assisted patient with Mounjaro  injection.

## 2024-05-17 NOTE — Telephone Encounter (Signed)
 Status of Mounjaro  authorization

## 2024-05-18 ENCOUNTER — Other Ambulatory Visit (HOSPITAL_COMMUNITY): Payer: Self-pay

## 2024-05-18 ENCOUNTER — Telehealth: Payer: Self-pay | Admitting: Pharmacy Technician

## 2024-05-18 NOTE — Telephone Encounter (Signed)
 Pharmacy Patient Advocate Encounter   Received notification from Pt Calls Messages that prior authorization for Mounjaro  2.5 mg is required/requested.   Insurance verification completed.   The patient is insured through [employer] .   Per test claim: The current 28 day co-pay is, $265.55.  No PA needed at this time. This test claim was processed through [facility]- copay amounts may vary at other pharmacies due to pharmacy/plan contracts, or as the patient moves through the different stages of their insurance plan.    Her insurance pd $796.99 her out of pocket is $265.55 Pt can go to Mounjaro  website and sign up for a savings card which could possibly reduce her copay to $25.00

## 2024-05-18 NOTE — Telephone Encounter (Signed)
 Good morning Maria Franklin, please see telephone encounter from this morning 05/18/24

## 2024-05-24 ENCOUNTER — Other Ambulatory Visit: Payer: Self-pay | Admitting: Internal Medicine

## 2024-05-24 DIAGNOSIS — I10 Essential (primary) hypertension: Secondary | ICD-10-CM

## 2024-05-25 NOTE — Telephone Encounter (Signed)
 Requested Prescriptions  Pending Prescriptions Disp Refills   lisinopril  (ZESTRIL ) 40 MG tablet [Pharmacy Med Name: LISINOPRIL  40 MG TABLET] 90 tablet 1    Sig: TAKE 1 TABLET BY MOUTH EVERY DAY     Cardiovascular:  ACE Inhibitors Failed - 05/25/2024  5:17 PM      Failed - Cr in normal range and within 180 days    Creat  Date Value Ref Range Status  05/07/2024 1.18 (H) 0.50 - 1.05 mg/dL Final   Creatinine,U  Date Value Ref Range Status  07/25/2019 153.6 mg/dL Final   Creatinine, Urine  Date Value Ref Range Status  05/07/2024 122 20 - 275 mg/dL Final         Passed - K in normal range and within 180 days    Potassium  Date Value Ref Range Status  05/07/2024 4.6 3.5 - 5.3 mmol/L Final         Passed - Patient is not pregnant      Passed - Last BP in normal range    BP Readings from Last 1 Encounters:  05/07/24 112/70         Passed - Valid encounter within last 6 months    Recent Outpatient Visits           2 weeks ago Type 2 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 disease, without long-term current use of insulin  [facility])   [facility] [place], Rankin Buzzard, NP

## 2024-05-30 DIAGNOSIS — Z833 Family history of diabetes mellitus: Secondary | ICD-10-CM | POA: Diagnosis not present

## 2024-05-30 DIAGNOSIS — E039 Hypothyroidism, unspecified: Secondary | ICD-10-CM | POA: Diagnosis not present

## 2024-05-30 DIAGNOSIS — K219 Gastro-esophageal reflux disease without esophagitis: Secondary | ICD-10-CM | POA: Diagnosis not present

## 2024-05-30 DIAGNOSIS — Z809 Family history of malignant neoplasm, unspecified: Secondary | ICD-10-CM | POA: Diagnosis not present

## 2024-05-30 DIAGNOSIS — E785 Hyperlipidemia, unspecified: Secondary | ICD-10-CM | POA: Diagnosis not present

## 2024-05-30 DIAGNOSIS — R32 Unspecified urinary incontinence: Secondary | ICD-10-CM | POA: Diagnosis not present

## 2024-05-30 DIAGNOSIS — Z87891 Personal history of nicotine dependence: Secondary | ICD-10-CM | POA: Diagnosis not present

## 2024-05-30 DIAGNOSIS — F209 Schizophrenia, unspecified: Secondary | ICD-10-CM | POA: Diagnosis not present

## 2024-05-30 DIAGNOSIS — Z7982 Long term (current) use of aspirin: Secondary | ICD-10-CM | POA: Diagnosis not present

## 2024-05-30 DIAGNOSIS — Z8249 Family history of ischemic heart disease and other diseases of the circulatory system: Secondary | ICD-10-CM | POA: Diagnosis not present

## 2024-05-30 DIAGNOSIS — N1831 Chronic kidney disease, stage 3a: Secondary | ICD-10-CM | POA: Diagnosis not present

## 2024-06-11 ENCOUNTER — Other Ambulatory Visit: Payer: Self-pay | Admitting: Internal Medicine

## 2024-06-12 NOTE — Telephone Encounter (Signed)
 Requested Prescriptions  Pending Prescriptions Disp Refills   carvedilol  (COREG ) 12.5 MG tablet [Pharmacy Med Name: CARVEDILOL  12.5 MG TABLET] 180 tablet 1    Sig: TAKE 1 TABLET BY MOUTH TWICE A DAY WITH FOOD     Cardiovascular: Beta Blockers 3 Failed - 06/12/2024  3:56 PM      Failed - Cr in normal range and within 360 days    Creat  Date Value Ref Range Status  05/07/2024 1.18 (H) 0.50 - 1.05 mg/dL Final   Creatinine, Urine  Date Value Ref Range Status  05/07/2024 122 20 - 275 mg/dL Final         Passed - AST in normal range and within 360 days    AST  Date Value Ref Range Status  05/07/2024 20 10 - 35 U/L Final         Passed - ALT in normal range and within 360 days    ALT  Date Value Ref Range Status  05/07/2024 18 6 - 29 U/L Final         Passed - Last BP in normal range    BP Readings from Last 1 Encounters:  05/07/24 112/70         Passed - Last Heart Rate in normal range    Pulse Readings from Last 1 Encounters:  04/18/23 70         Passed - Valid encounter within last 6 months    Recent Outpatient Visits           1 month ago Type 2 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 disease, without long-term current use of insulin  [facility])   [facility] [place], Angeline ORN, [place]

## 2024-06-22 ENCOUNTER — Other Ambulatory Visit: Payer: Self-pay | Admitting: Internal Medicine

## 2024-06-22 DIAGNOSIS — I10 Essential (primary) hypertension: Secondary | ICD-10-CM

## 2024-06-22 NOTE — Telephone Encounter (Signed)
 Requested Prescriptions  Pending Prescriptions Disp Refills   amLODipine  (NORVASC ) 10 MG tablet [Pharmacy Med Name: AMLODIPINE  BESYLATE 10 MG TAB] 90 tablet 1    Sig: TAKE 1 TABLET BY MOUTH EVERY DAY     Cardiovascular: Calcium  Channel Blockers 2 Passed - 06/22/2024  4:17 PM      Passed - Last BP in normal range    BP Readings from Last 1 Encounters:  05/07/24 112/70         Passed - Last Heart Rate in normal range    Pulse Readings from Last 1 Encounters:  04/18/23 70         Passed - Valid encounter within last 6 months    Recent Outpatient Visits           1 month ago Type 2 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 disease, without long-term current use of insulin  [facility])    [facility] [place], Angeline ORN, [place]

## 2024-07-07 ENCOUNTER — Other Ambulatory Visit: Payer: Self-pay | Admitting: Internal Medicine

## 2024-07-07 DIAGNOSIS — E782 Mixed hyperlipidemia: Secondary | ICD-10-CM

## 2024-07-09 NOTE — Telephone Encounter (Signed)
 Requested Prescriptions  Pending Prescriptions Disp Refills   atorvastatin  (LIPITOR) 40 MG tablet [Pharmacy Med Name: ATORVASTATIN  40 MG TABLET] 90 tablet 3    Sig: TAKE 1 TABLET BY MOUTH EVERY DAY AT 6PM     Cardiovascular:  Antilipid - Statins Failed - 07/09/2024  3:31 PM      Failed - Lipid Panel in normal range within the last 12 months    Cholesterol  Date Value Ref Range Status  05/07/2024 145 <200 mg/dL Final   LDL Cholesterol (Calc)  Date Value Ref Range Status  05/07/2024 56 mg/dL (calc) Final    Comment:    Reference range: <100 . Desirable range <100 mg/dL for primary prevention;   <70 mg/dL for patients with CHD or diabetic patients  with > or = 2 CHD risk factors. SABRA LDL-C is now calculated using the Martin-Hopkins  calculation, which is a validated novel method providing  better accuracy than the Friedewald equation in the  estimation of LDL-C.  Gladis APPLETHWAITE et al. SANDREA. 7986;689(80): 2061-2068  (http://education.[facility].com/faq/FAQ164)    Direct LDL  Date Value Ref Range Status  01/14/2014 97.1 mg/dL Final    Comment:    Optimal:  <100 mg/dLNear or Above Optimal:  100-129 mg/dLBorderline High:  130-159 mg/dLHigh:  160-189 mg/dLVery High:  >190 mg/dL   HDL  Date Value Ref Range Status  05/07/2024 73 > OR = 50 mg/dL Final   Triglycerides  Date Value Ref Range Status  05/07/2024 77 <150 mg/dL Final         Passed - Patient is not pregnant      Passed - Valid encounter within last 12 months    Recent Outpatient Visits           2 months ago Type 2 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 disease, without long-term current use of insulin  [facility])   [facility] [place], Angeline ORN, NP

## 2024-09-04 ENCOUNTER — Encounter: Payer: Self-pay | Admitting: Allergy & Immunology

## 2024-09-04 ENCOUNTER — Other Ambulatory Visit: Payer: Self-pay

## 2024-09-04 ENCOUNTER — Ambulatory Visit (INDEPENDENT_AMBULATORY_CARE_PROVIDER_SITE_OTHER): Admitting: Allergy & Immunology

## 2024-09-04 VITALS — BP 120/80 | HR 78 | Temp 98.2°F | Resp 18 | Ht 62.21 in | Wt 269.9 lb

## 2024-09-04 DIAGNOSIS — J4540 Moderate persistent asthma, uncomplicated: Secondary | ICD-10-CM | POA: Diagnosis not present

## 2024-09-04 DIAGNOSIS — J3089 Other allergic rhinitis: Secondary | ICD-10-CM | POA: Diagnosis not present

## 2024-09-04 DIAGNOSIS — J302 Other seasonal allergic rhinitis: Secondary | ICD-10-CM | POA: Diagnosis not present

## 2024-09-04 DIAGNOSIS — J339 Nasal polyp, unspecified: Secondary | ICD-10-CM | POA: Diagnosis not present

## 2024-09-04 NOTE — Patient Instructions (Addendum)
 1. Seasonal and perennial allergic rhinitis - Continue with the Flonase  two sprays per nostril daily.  - Continue with Zyrtec  one tablet daily.  2. Moderate persistent asthma - not well controlled - Lung testing not done since the electricity went out.  - We are going to stop the Wixela and start Trelegy 100 mcg 1 puff twice daily instead. - Sample and demonstration provided. - We are not going to send to cardiology due to shortness of breath for cardiac evaluation. - Daily controller medication(s): Trelegy 100mcg one puff ONCE daily - Prior to physical activity: albuterol  2 puffs 10-15 minutes before physical activity. - Rescue medications: albuterol  4 puffs every 4-6 hours as needed or albuterol  nebulizer one vial every 4-6 hours as needed - Asthma control goals:  * Full participation in all desired activities (may need albuterol  before activity) * Albuterol  use two time or less a week on average (not counting use with activity) * Cough interfering with sleep two time or less a month * Oral steroids no more than once a year * No hospitalizations  3. Nasal polyposis - Continue with your current management.   4. Return in about 2 weeks (around 09/18/2024). You can have the follow up appointment with Dr. Iva or a Nurse Practicioner (our Nurse Practitioners are excellent and always have Physician oversight!).    Please inform us  of any Emergency Department visits, hospitalizations, or changes in symptoms. Call us  before going to the ED for breathing or allergy symptoms since we might be able to fit you in for a sick visit. Feel free to contact us  anytime with any questions, problems, or concerns.  It was a pleasure to meet you today!  Websites that have reliable patient information: 1. American Academy of Asthma, Allergy, and Immunology: www.aaaai.org 2. Food Allergy Research and Education (FARE): foodallergy.org 3. Mothers of Asthmatics: http://www.asthmacommunitynetwork.org 4.  American College of Allergy, Asthma, and Immunology: www.acaai.org      "Like" us  on Facebook and Instagram for our latest updates!      A healthy democracy works best when [employer] participate! Make sure you are registered to vote! If you have moved or changed any of your contact information, you will need to get this updated before voting! Scan the QR codes below to learn more!

## 2024-09-04 NOTE — Progress Notes (Signed)
 FOLLOW UP  Date of Service/Encounter:  09/04/24   Assessment:   Seasonal and perennial allergic rhinitis (grasses, weeds, trees, indoor and outdoor molds, dust mite, roach)   Moderate persistent asthma with acute exacerbation secondary to influenza   Adverse reaction to Dupixent - unclear causation (although I think it is from a concurrent COVID-19 infection)   Nasal polyposis - s/p three sinus surgeries (1990s, 2013, 2020)   Adverse food reactions (nuts, mushrooms) - with negative testing the past   Pruritus - controlled with emollients alone   Difficulty affording medications - improved with a medication grant   Fully vaccinated to COVID-19  Plan/Recommendations:   1. Seasonal and perennial allergic rhinitis - Continue with the Flonase  two sprays per nostril daily.  - Continue with Zyrtec  one tablet daily.  2. Moderate persistent asthma - not well controlled - Lung testing not done since the electricity went out.  - We are going to stop the Wixela and start Trelegy 100 mcg 1 puff twice daily instead. - Sample and demonstration provided. - We are not going to send to cardiology due to shortness of breath for cardiac evaluation. - Daily controller medication(s): Trelegy 100mcg one puff ONCE daily - Prior to physical activity: albuterol  2 puffs 10-15 minutes before physical activity. - Rescue medications: albuterol  4 puffs every 4-6 hours as needed or albuterol  nebulizer one vial every 4-6 hours as needed - Asthma control goals:  * Full participation in all desired activities (may need albuterol  before activity) * Albuterol  use two time or less a week on average (not counting use with activity) * Cough interfering with sleep two time or less a month * Oral steroids no more than once a year * No hospitalizations  3. Nasal polyposis - Continue with your current management.   4. Return in about 2 weeks (around 09/18/2024). You can have the follow up appointment with Dr.  Iva or a Nurse Practicioner (our Nurse Practitioners are excellent and always have Physician oversight!).    Subjective:   Maria Franklin is a 63 y.o. female presenting today for follow up of  Chief Complaint  Patient presents with   Asthma    Maria Franklin has a history of the following: Patient Active Problem List   Diagnosis Date Noted   Iron deficiency anemia 05/07/2024   Class 3 severe obesity due to excess calories with body mass index (BMI) of 45.0 to 49.9 in adult 07/24/2021   Paranoid schizophrenia (HCC) 10/16/2019   CKD (chronic kidney disease), stage III (HCC) 08/29/2019   OSA (obstructive sleep apnea) 04/29/2016   GERD (gastroesophageal reflux disease) 10/28/2015   Type 2 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 disease, without long-term current use of insulin  (HCC) 04/19/2014   Ludwig's angina 12/30/2013   Hypertension 01/10/2013   Hyperlipidemia associated with type 2 diabetes mellitus (HCC) 01/10/2013   Hypothyroidism 01/10/2013   Asthma 03/13/2009    History obtained from: chart review and patient.  Discussed the use of AI scribe software for clinical note transcription with the patient and/or guardian, who gave verbal consent to proceed.  Maria Franklin is a 63 y.o. female presenting for a follow up visit.  She was last seen in October 2023.  At that time, we continue with Advair  250 mcg 1 puff twice daily as well as albuterol  as needed.  For her rhinitis, we continue with Flonase  and Zyrtec .  For her nasal polyps, she was doing well with the Dupixent.  Since last visit, she  has continued to have shortness of breath episodes.  This is what brought her back in today.  Asthma/Respiratory Symptom History: She was on Advair  1 puff twice daily.  This seemed to be working for a long time, but then she was changed to the generic Advair .  She feels like the generic Advair  does not work as well.  She has been having shortness of breath even with regular activities  of daily living.  She does use her albuterol  when she is short of breath and it does work, although sometimes just sitting still and relaxing helps as well.  She has never seen cardiology but would like a referral to 1 just to be on the safe side.  She gets a new albuterol  around every 2 months or so.  She was only on Dupixent for a short period of time and does not remember whether it helped her breathing or not.  Allergic Rhinitis Symptom History: She remains on the Flonase  2 sprays per nostril daily as well as Zyrtec .  She has not been on antibiotics.  She has not had any surgeries recently.  Her last polyp surgery was in 2013.  She does not see otolaryngology on a routine basis at this point.  She remains stable with regards to her her schizophrenia.  This is why she is on disability.  Otherwise, there have been no changes to her past medical history, surgical history, family history, or social history.    Review of systems otherwise negative other than that mentioned in the HPI.    Objective:   Blood pressure 120/80, pulse 78, temperature 98.2 F (36.8 C), temperature source Temporal, resp. rate 18, height 5' 2.21 (1.58 m), weight 269 lb 14.4 oz (122.4 kg), last menstrual period 02/02/2017, SpO2 96%. Body mass index is 49.04 kg/m.    Physical Exam Vitals reviewed.  Constitutional:      Appearance: She is well-developed.  HENT:     Head: Normocephalic and atraumatic.     Right Ear: Tympanic membrane, ear canal and external ear normal.     Left Ear: Tympanic membrane, ear canal and external ear normal.     Nose: No nasal deformity, septal deviation, mucosal edema or rhinorrhea.     Right Turbinates: Enlarged, swollen and pale.     Left Turbinates: Enlarged, swollen and pale.     Right Sinus: No maxillary sinus tenderness or frontal sinus tenderness.     Left Sinus: No maxillary sinus tenderness or frontal sinus tenderness.     Comments: Polyps are more prominent on the right  side.  It is obstructing around 50 to 75% of the nasal cavity on the right.    Mouth/Throat:     Mouth: Mucous membranes are not pale and not dry.     Pharynx: Uvula midline.  Eyes:     General: Lids are normal. Allergic shiner present.        Right eye: No discharge.        Left eye: No discharge.     Conjunctiva/sclera: Conjunctivae normal.     Right eye: Right conjunctiva is not injected. No chemosis.    Left eye: Left conjunctiva is not injected. No chemosis.    Pupils: Pupils are equal, round, and reactive to light.  Cardiovascular:     Rate and Rhythm: Normal rate and regular rhythm.     Heart sounds: Normal heart sounds.  Pulmonary:     Effort: Pulmonary effort is normal. No tachypnea, accessory muscle usage or  respiratory distress.     Breath sounds: Normal breath sounds. No wheezing, rhonchi or rales.     Comments: Moving air well in all lung fields.  No increased work of breathing. Chest:     Chest wall: No tenderness.  Lymphadenopathy:     Cervical: No cervical adenopathy.  Skin:    General: Skin is warm.     Capillary Refill: Capillary refill takes less than 2 seconds.     Coloration: Skin is not pale.     Findings: No abrasion, erythema, petechiae or rash. Rash is not papular, urticarial or vesicular.     Comments: No eczematous or urticarial lesions noted.  Neurological:     Mental Status: She is alert.  Psychiatric:        Behavior: Behavior is cooperative.      Diagnostic studies: none      Marty Shaggy, MD  [facility] 

## 2024-09-11 ENCOUNTER — Encounter: Payer: Self-pay | Admitting: Internal Medicine

## 2024-09-11 DIAGNOSIS — Z9889 Other specified postprocedural states: Secondary | ICD-10-CM

## 2024-09-24 DIAGNOSIS — J329 Chronic sinusitis, unspecified: Secondary | ICD-10-CM | POA: Diagnosis not present

## 2024-09-24 DIAGNOSIS — J339 Nasal polyp, unspecified: Secondary | ICD-10-CM | POA: Diagnosis not present

## 2024-09-24 DIAGNOSIS — J309 Allergic rhinitis, unspecified: Secondary | ICD-10-CM | POA: Diagnosis not present

## 2024-10-03 DIAGNOSIS — L989 Disorder of the skin and subcutaneous tissue, unspecified: Secondary | ICD-10-CM | POA: Diagnosis not present

## 2024-10-04 ENCOUNTER — Ambulatory Visit: Admitting: Internal Medicine

## 2024-10-21 ENCOUNTER — Encounter: Payer: Self-pay | Admitting: Internal Medicine

## 2024-10-23 MED ORDER — FLUTICASONE-SALMETEROL 250-50 MCG/ACT IN AEPB: 1.0000 | INHALATION_SPRAY | Freq: Two times a day (BID) | RESPIRATORY_TRACT | 2 refills | Status: AC

## 2024-10-23 NOTE — Addendum Note (Signed)
 Addended by: ANTONETTE ANGELINE ORN on: 10/23/2024 08:55 AM   Modules accepted: Orders

## 2024-10-24 ENCOUNTER — Other Ambulatory Visit: Payer: Self-pay | Admitting: Internal Medicine

## 2024-10-26 NOTE — Telephone Encounter (Signed)
 Requested Prescriptions  Pending Prescriptions Disp Refills   omeprazole  (PRILOSEC) 20 MG capsule [Pharmacy Med Name: OMEPRAZOLE  DR 20 MG CAPSULE] 90 capsule 1    Sig: TAKE 1 CAPSULE BY MOUTH EVERY DAY     Gastroenterology: Proton Pump Inhibitors Passed - 10/26/2024 12:03 PM      Passed - Valid encounter within last 12 months    Recent Outpatient Visits           5 months ago Type 2 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 disease, without long-term current use of insulin  [facility])   [facility] [place], Angeline OR[place], [place]

## 2024-11-01 ENCOUNTER — Other Ambulatory Visit: Payer: Self-pay | Admitting: Internal Medicine

## 2024-11-01 ENCOUNTER — Other Ambulatory Visit (HOSPITAL_COMMUNITY): Payer: Self-pay | Admitting: Psychiatry

## 2024-11-01 DIAGNOSIS — F200 Paranoid schizophrenia: Secondary | ICD-10-CM

## 2024-11-06 NOTE — Telephone Encounter (Signed)
 Requested Prescriptions  Pending Prescriptions Disp Refills   fluticasone  (FLONASE ) 50 MCG/ACT nasal spray [Pharmacy Med Name: FLUTICASONE  PROP 50 MCG SPRAY] 48 mL 2    Sig: SPRAY 2 SPRAYS INTO EACH NOSTRIL EVERY DAY     Ear, Nose, and Throat: Nasal Preparations - Corticosteroids Passed - 11/06/2024 11:16 AM      Passed - Valid encounter within last 12 months    Recent Outpatient Visits           6 months ago Type 2 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 disease, without long-term current use of insulin  [facility])   [facility] [place], [place], [place]

## 2024-11-07 ENCOUNTER — Ambulatory Visit: Admitting: Internal Medicine

## 2024-11-07 ENCOUNTER — Encounter: Payer: Self-pay | Admitting: Internal Medicine

## 2024-11-07 VITALS — BP 120/74 | Ht 62.5 in | Wt 270.2 lb

## 2024-11-07 DIAGNOSIS — N1831 Chronic kidney disease, stage 3a: Secondary | ICD-10-CM

## 2024-11-07 DIAGNOSIS — E66813 Obesity, class 3: Secondary | ICD-10-CM | POA: Insufficient documentation

## 2024-11-07 DIAGNOSIS — E039 Hypothyroidism, unspecified: Secondary | ICD-10-CM

## 2024-11-07 DIAGNOSIS — Z0001 Encounter for general adult medical examination with abnormal findings: Secondary | ICD-10-CM

## 2024-11-07 DIAGNOSIS — E1122 Type 2 diabetes mellitus with diabetic chronic kidney disease: Secondary | ICD-10-CM | POA: Diagnosis not present

## 2024-11-07 DIAGNOSIS — Z6841 Body Mass Index (BMI) 40.0 and over, adult: Secondary | ICD-10-CM | POA: Diagnosis not present

## 2024-11-07 DIAGNOSIS — L989 Disorder of the skin and subcutaneous tissue, unspecified: Secondary | ICD-10-CM | POA: Diagnosis not present

## 2024-11-07 DIAGNOSIS — Z23 Encounter for immunization: Secondary | ICD-10-CM | POA: Diagnosis not present

## 2024-11-07 DIAGNOSIS — Z1231 Encounter for screening mammogram for malignant neoplasm of breast: Secondary | ICD-10-CM

## 2024-11-07 LAB — T4, FREE: Free T4: 1 ng/dL (ref 0.8–1.8)

## 2024-11-07 LAB — LIPID PANEL
Cholesterol: 160 mg/dL (ref <200)
HDL: 71 mg/dL (ref >=50)
LDL Cholesterol (Calc): 71 mg/dL
Non-HDL Cholesterol (Calc): 89 mg/dL (ref <130)
Total CHOL/HDL Ratio: 2.3 (calc) (ref <5.0)
Triglycerides: 96 mg/dL (ref <150)

## 2024-11-07 LAB — COMPREHENSIVE METABOLIC PANEL WITH GFR
AG Ratio: 1.6 (calc) (ref 1.0–2.5)
ALT: 19 U/L (ref 6–29)
AST: 22 U/L (ref 10–35)
Albumin: 4.2 g/dL (ref 3.6–5.1)
Alkaline phosphatase (APISO): 90 U/L (ref 37–153)
BUN/Creatinine Ratio: 14 (calc) (ref 6–22)
BUN: 17 mg/dL (ref 7–25)
CO2: 27 mmol/L (ref 20–32)
Calcium: 9.3 mg/dL (ref 8.6–10.4)
Chloride: 106 mmol/L (ref 98–110)
Creat: 1.2 mg/dL — ABNORMAL HIGH (ref 0.50–1.05)
Globulin: 2.6 g/dL (ref 1.9–3.7)
Glucose, Bld: 118 mg/dL — ABNORMAL HIGH (ref 65–99)
Potassium: 4.4 mmol/L (ref 3.5–5.3)
Sodium: 141 mmol/L (ref 135–146)
Total Bilirubin: 0.4 mg/dL (ref 0.2–1.2)
Total Protein: 6.8 g/dL (ref 6.1–8.1)
eGFR: 51 mL/min/1.73m2 — ABNORMAL LOW (ref >=60)

## 2024-11-07 LAB — HEMOGLOBIN A1C
Hgb A1c MFr Bld: 6.5 % — ABNORMAL HIGH (ref <5.7)
Mean Plasma Glucose: 140 mg/dL
eAG (mmol/L): 7.7 mmol/L

## 2024-11-07 LAB — CBC
HCT: 34.9 % — ABNORMAL LOW (ref 35.9–46.0)
Hemoglobin: 11.2 g/dL — ABNORMAL LOW (ref 11.7–15.5)
MCH: 28.4 pg (ref 27.0–33.0)
MCHC: 32.1 g/dL (ref 31.6–35.4)
MCV: 88.6 fL (ref 81.4–101.7)
MPV: 9.9 fL (ref 7.5–12.5)
Platelets: 233 Thousand/uL (ref 140–400)
RBC: 3.94 Million/uL (ref 3.80–5.10)
RDW: 12.5 % (ref 11.0–15.0)
WBC: 6.6 Thousand/uL (ref 3.8–10.8)

## 2024-11-07 LAB — TSH: TSH: 3.8 m[IU]/L (ref 0.40–4.50)

## 2024-11-07 NOTE — Patient Instructions (Signed)
 Health Maintenance for Postmenopausal Women Menopause is a normal process in which your ability to get pregnant comes to an end. This process happens slowly over many months or years, usually between the ages of 76 and 38. Menopause is complete when you have missed your menstrual period for 12 months. It is important to talk with your health care provider about some of the most common conditions that affect women after menopause (postmenopausal women). These include heart disease, cancer, and bone loss (osteoporosis). Adopting a healthy lifestyle and getting preventive care can help to promote your health and wellness. The actions you take can also lower your chances of developing some of these common conditions. What are the signs and symptoms of menopause? During menopause, you may have the following symptoms: Hot flashes. These can be moderate or severe. Night sweats. Decrease in sex drive. Mood swings. Headaches. Tiredness (fatigue). Irritability. Memory problems. Problems falling asleep or staying asleep. Talk with your health care provider about treatment options for your symptoms. Do I need hormone replacement therapy? Hormone replacement therapy is effective in treating symptoms that are caused by menopause, such as hot flashes and night sweats. Hormone replacement carries certain risks, especially as you become older. If you are thinking about using estrogen or estrogen with progestin, discuss the benefits and risks with your health care provider. How can I reduce my risk for heart disease and stroke? The risk of heart disease, heart attack, and stroke increases as you age. One of the causes may be a change in the body's hormones during menopause. This can affect how your body uses dietary fats, triglycerides, and cholesterol. Heart attack and stroke are medical emergencies. There are many things that you can do to help prevent heart disease and stroke. Watch your blood pressure High  blood pressure causes heart disease and increases the risk of stroke. This is more likely to develop in people who have high blood pressure readings or are overweight. Have your blood pressure checked: Every 3-5 years if you are 32-23 years of age. Every year if you are 31 years old or older. Eat a healthy diet  Eat a diet that includes plenty of vegetables, fruits, low-fat dairy products, and lean protein. Do not eat a lot of foods that are high in solid fats, added sugars, or sodium. Get regular exercise Get regular exercise. This is one of the most important things you can do for your health. Most adults should: Try to exercise for at least 150 minutes each week. The exercise should increase your heart rate and make you sweat (moderate-intensity exercise). Try to do strengthening exercises at least twice each week. Do these in addition to the moderate-intensity exercise. Spend less time sitting. Even light physical activity can be beneficial. Other tips Work with your health care provider to achieve or maintain a healthy weight. Do not use any products that contain nicotine or tobacco. These products include cigarettes, chewing tobacco, and vaping devices, such as e-cigarettes. If you need help quitting, ask your health care provider. Know your numbers. Ask your health care provider to check your cholesterol and your blood sugar (glucose). Continue to have your blood tested as directed by your health care provider. Do I need screening for cancer? Depending on your health history and family history, you may need to have cancer screenings at different stages of your life. This may include screening for: Breast cancer. Cervical cancer. Lung cancer. Colorectal cancer. What is my risk for osteoporosis? After menopause, you may be  at increased risk for osteoporosis. Osteoporosis is a condition in which bone destruction happens more quickly than new bone creation. To help prevent osteoporosis or  the bone fractures that can happen because of osteoporosis, you may take the following actions: If you are 24-54 years old, get at least 1,000 mg of calcium and at least 600 international units (IU) of vitamin D  per day. If you are older than age 75 but younger than age 30, get at least 1,200 mg of calcium and at least 600 international units (IU) of vitamin D  per day. If you are older than age 8, get at least 1,200 mg of calcium and at least 800 international units (IU) of vitamin D  per day. Smoking and drinking excessive alcohol increase the risk of osteoporosis. Eat foods that are rich in calcium and vitamin D , and do weight-bearing exercises several times each week as directed by your health care provider. How does menopause affect my mental health? Depression may occur at any age, but it is more common as you become older. Common symptoms of depression include: Feeling depressed. Changes in sleep patterns. Changes in appetite or eating patterns. Feeling an overall lack of motivation or enjoyment of activities that you previously enjoyed. Frequent crying spells. Talk with your health care provider if you think that you are experiencing any of these symptoms. General instructions See your health care provider for regular wellness exams and vaccines. This may include: Scheduling regular health, dental, and eye exams. Getting and maintaining your vaccines. These include: Influenza vaccine. Get this vaccine each year before the flu season begins. Pneumonia vaccine. Shingles vaccine. Tetanus, diphtheria, and pertussis (Tdap) booster vaccine. Your health care provider may also recommend other immunizations. Tell your health care provider if you have ever been abused or do not feel safe at home. Summary Menopause is a normal process in which your ability to get pregnant comes to an end. This condition causes hot flashes, night sweats, decreased interest in sex, mood swings, headaches, or lack  of sleep. Treatment for this condition may include hormone replacement therapy. Take actions to keep yourself healthy, including exercising regularly, eating a healthy diet, watching your weight, and checking your blood pressure and blood sugar levels. Get screened for cancer and depression. Make sure that you are up to date with all your vaccines. This information is not intended to replace advice given to you by your health care provider. Make sure you discuss any questions you have with your health care provider. Document Revised: 04/13/2021 Document Reviewed: 04/13/2021 Elsevier Patient Education  2024 [employer].

## 2024-11-07 NOTE — Assessment & Plan Note (Signed)
 Encouraged diet and exercise for weight loss ?

## 2024-11-07 NOTE — Progress Notes (Signed)
 Subjective:    Patient ID: Maria Franklin, female    DOB: 09/25/1961, 63 y.o.   MRN: [id number]  HPI  Patient presents to clinic today for her annual exam.  She also reports a skin lesion of her left elbow.  She reports she noticed this 3 to 4 months ago.  It has gotten larger in size.  She did go to urgent care for evaluation and they advised her to follow-up with her PCP but did give her a cream to put on the area.  She reports the cream has not helped at all.  Flu: 09/2022 Tetanus: 04/2016 COVID: Pfizer x4 Pneumovax: 03/2019 Prevnar 20: Never Shingrix: x 2  Pap smear: 12/2021 Mammogram: 02/2024 Bone density: 05/2022 Colon screening: 12/2021, 3 years Vision screening: annually Dentist: biannually  Diet: She does eat meat. She consumes some fruits and veggies. She does eat some fried foods. She drinks mostly coffee, water . Exercise: None  Review of Systems     Past Medical History:  Diagnosis Date   Allergy    Anemia    Angio-edema    Asthma    ALL THE TIME   LAST FLARE UP 12/2018   Chest pain 08/2019   Chicken pox    Chronic kidney disease    Diabetes mellitus without complication (HCC)    GERD (gastroesophageal reflux disease)    Hyperlipidemia    Hypertension    Hyperthyroidism    Recurrent upper respiratory infection (URI)    Schizophrenia (HCC)    Sleep apnea    DX 2-3 YRS AGO.   Sleep apnea    Thyroid  disease    Urticaria     Current Outpatient Medications  Medication Sig Dispense Refill   albuterol  (PROVENTIL ) (2.5 MG/3ML) 0.083% nebulizer solution Take 3 mLs (2.5 mg total) by nebulization every 4 (four) hours as needed for wheezing or shortness of breath. 75 mL 1   albuterol  (VENTOLIN  HFA) 108 (90 Base) MCG/ACT inhaler INHALE 1 TO 2 PUFFS INTO THE LUNGS EVERY 6 HOURS AS NEEDED WHEEZING/SHORTNESS OF BREATH 18 each 1   amLODipine  (NORVASC ) 10 MG tablet TAKE 1 TABLET BY MOUTH EVERY DAY 90 tablet 1   aspirin  EC 81 MG EC tablet Take 1 tablet (81 mg total) by  mouth daily. 30 tablet 0   atorvastatin  (LIPITOR) 40 MG tablet TAKE 1 TABLET BY MOUTH EVERY DAY AT 6PM 90 tablet 3   b complex vitamins capsule Take 1 capsule by mouth daily.     carvedilol  (COREG ) 12.5 MG tablet TAKE 1 TABLET BY MOUTH TWICE A DAY WITH FOOD 180 tablet 1   cetirizine  (ZYRTEC ) 10 MG tablet Take 1 tablet (10 mg total) by mouth daily. 30 tablet 5   Ferrous Sulfate  (IRON) 325 (65 Fe) MG TABS Take 1 tablet by mouth every morning.     fluticasone  (FLONASE ) 50 MCG/ACT nasal spray SPRAY 2 SPRAYS INTO EACH NOSTRIL EVERY DAY 48 mL 2   fluticasone -salmeterol (ADVAIR ) 250-50 MCG/ACT AEPB Inhale 1 puff into the lungs in the morning and at bedtime. 60 each 2   haloperidol  (HALDOL ) 5 MG tablet TAKE 0.5 TABLETS (2.5 MG TOTAL) BY MOUTH AT BEDTIME. 45 tablet 1   hydrALAZINE  (APRESOLINE ) 25 MG tablet TAKE 1/2 TABLET BY MOUTH EVERY MORNING AND AT BEDTIME 90 tablet 3   levothyroxine  (SYNTHROID ) 50 MCG tablet TAKE 1 TABLET BY MOUTH DAILY BEFORE BREAKFAST 90 tablet 3   lisinopril  (ZESTRIL ) 40 MG tablet TAKE 1 TABLET BY MOUTH EVERY DAY 90 tablet  1   Multiple Vitamins-Minerals (MULTIVITAMIN WITH MINERALS) tablet Take 1 tablet by mouth every evening.     omeprazole  (PRILOSEC) 20 MG capsule TAKE 1 CAPSULE BY MOUTH EVERY DAY 90 capsule 1   sertraline  (ZOLOFT ) 50 MG tablet Take 1 tablet (50 mg total) by mouth daily. 90 tablet 1   tirzepatide  (MOUNJARO ) 2.5 MG/0.5ML Pen Inject 2.5 mg into the skin once a week.     No current facility-administered medications for this visit.    Allergies  Allergen Reactions   Bee Pollen Cough   Pollen Extract Cough   Latex Itching, Rash and Other (See Comments)    Severe itching    Family History  Problem Relation Age of Onset   Mental illness Mother    Hypertension Mother    Schizophrenia Mother    Depression Mother    Heart disease Mother    Diabetes Mother    Cancer Father        LUNG   Hypertension Sister    Cancer Sister        BREAST   Breast cancer  Sister    Hypertension Sister    Heart attack Sister    Hypertension Sister    Heart attack Sister    Mental illness Brother    Hypertension Brother    Schizophrenia Brother    Depression Brother    Hypertension Brother    Schizophrenia Brother    Hypertension Brother    Breast cancer Maternal Aunt    Schizophrenia Maternal Uncle    Heart disease Maternal Grandmother    Depression Maternal Grandmother    Schizophrenia Maternal Grandmother    Allergic rhinitis Neg Hx    Angioedema Neg Hx    Asthma Neg Hx    Atopy Neg Hx    Eczema Neg Hx    Immunodeficiency Neg Hx    Urticaria Neg Hx    Colon cancer Neg Hx    Colon polyps Neg Hx    Stomach cancer Neg Hx    Rectal cancer Neg Hx     Social History   Socioeconomic History   Marital status: Married    Spouse name: Evalene Barter   Number of children: 4   Years of education: Associate Degree   Highest education level: Associate degree: academic program  Occupational History   Occupation: unemployed  Tobacco Use   Smoking status: Former    Current packs/day: 0.00    Average packs/day: 0.3 packs/day for 6.0 years (1.5 ttl pk-yrs)    Types: Cigarettes    Start date: 76    Quit date: 1987    Years since quitting: 38.9   Smokeless tobacco: Never   Tobacco comments:    QUIT IN HER EARLY 20'S  Vaping Use   Vaping status: Never Used  Substance and Sexual Activity   Alcohol use: No    Alcohol/week: 0.0 standard drinks of alcohol   Drug use: No   Sexual activity: Not Currently  Other Topics Concern   Not on file  Social History Narrative   Pt is R handed   Lives in single story home with her husband and father-in-law   Has 4 children   Associates Degree x2   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   Social Drivers of Health   Financial Resource Strain: Low Risk  (11/04/2024)   Overall Financial Resource Strain (CARDIA)    Difficulty of Paying Living Expenses: Not  hard at  all  Food Insecurity: No Food Insecurity (11/04/2024)   Hunger Vital Sign    Worried About Running Out of Food in the Last Year: Never true    Ran Out of Food in the Last Year: Never true  Transportation Needs: No Transportation Needs (11/04/2024)   PRAPARE - Administrator, Civil Service (Medical): No    Lack of Transportation (Non-Medical): No  Physical Activity: Inactive (11/04/2024)   Exercise Vital Sign    Days of Exercise per Week: 0 days    Minutes of Exercise per Session: Not on file  Stress: No Stress Concern Present (11/04/2024)   [employer] of Occupational Health - Occupational Stress Questionnaire    Feeling of Stress: Not at all  Social Connections: Socially Integrated (11/04/2024)   Social Connection and Isolation Panel    Frequency of Communication with Friends and Family: More than three times a week    Frequency of Social Gatherings with Friends and Family: Patient declined    Attends Religious Services: More than 4 times per year    Active Member of [employer] or Organizations: Yes    Attends Engineer, Structural: More than 4 times per year    Marital Status: Married  Catering Manager Violence: Not At Risk (04/27/2024)   Humiliation, Afraid, Rape, and Kick questionnaire    Fear of Current or Ex-Partner: No    Emotionally Abused: No    Physically Abused: No    Sexually Abused: No     Constitutional: Denies fever, malaise, fatigue, headache or abrupt weight changes.  HEENT: Denies eye pain, eye redness, ear pain, ringing in the ears, wax buildup, runny nose, nasal congestion, bloody nose, or sore throat. Respiratory: Denies difficulty breathing, shortness of breath, cough or sputum production.   Cardiovascular: Denies chest pain, chest tightness, palpitations or swelling in the hands or feet.  Gastrointestinal: Denies abdominal pain, bloating, diarrhea, constipation, or blood in the stool.  GU: Denies urgency, frequency, pain with  urination, burning sensation, blood in urine, odor or discharge. Musculoskeletal: Denies decrease in range of motion, difficulty with gait, muscle pain or joint pain and swelling.  Skin: Patient reports skin lesion of left elbow.  Denies redness, rashes, or ulcercations.  Neurological: Denies dizziness, difficulty with memory, difficulty with speech or problems with balance and coordination.  Psych: Patient has a history of anxiety.  Denies depression, SI/HI.  No other specific complaints in a complete review of systems (except as listed in HPI above).  Objective:   Physical Exam BP 120/74 (BP Location: Left Arm, Patient Position: Sitting, Cuff Size: Large)   Ht 5' 2.5 (1.588 m)   Wt 270 lb 3.2 oz (122.6 kg)   LMP 02/02/2017 (Approximate)   BMI 48.63 kg/m     Wt Readings from Last 3 Encounters:  09/04/24 269 lb 14.4 oz (122.4 kg)  05/07/24 265 lb 9.6 oz (120.5 kg)  11/07/23 260 lb 3.2 oz (118 kg)    General: Appears her stated age, obese, in NAD. Skin: Warm, dry and intact.  2.25 cm raised, hyperpigmented scaly lesion noted of the left lateral elbow.  No ulcerations noted.  HEENT: Head: normal shape and size; Eyes: sclera white, no icterus, conjunctiva pink, PERRLA and EOMs intact;  Neck:  Neck supple, trachea midline. No masses, lumps or thyromegaly present.  Cardiovascular: Normal rate and rhythm. S1,S2 noted.  No murmur, rubs or gallops noted. No JVD or BLE edema. No carotid bruits noted. Pulmonary/Chest: Normal effort and positive vesicular breath sounds.  No respiratory distress. No wheezes, rales or ronchi noted.  Abdomen: Normal bowel sounds.  Musculoskeletal: Strength 5/5 BUE/BLE.  No difficulty with gait.  Neurological: Alert and oriented. Cranial nerves II-XII grossly intact. Coordination normal.  Psychiatric: Mood and affect normal. Behavior is normal. Judgment and thought content normal.     BMET    Component Value Date/Time   NA 140 05/07/2024 0919   K 4.6  05/07/2024 0919   CL 104 05/07/2024 0919   CO2 28 05/07/2024 0919   GLUCOSE 114 (H) 05/07/2024 0919   BUN 19 05/07/2024 0919   CREATININE 1.18 (H) 05/07/2024 0919   CALCIUM  9.5 05/07/2024 0919   GFRNONAA 44 (L) 11/18/2020 0939   GFRNONAA 53 (L) 07/28/2020 0942   GFRAA 61 07/28/2020 0942    Lipid Panel     Component Value Date/Time   CHOL 145 05/07/2024 0919   TRIG 77 05/07/2024 0919   HDL 73 05/07/2024 0919   CHOLHDL 2.0 05/07/2024 0919   VLDL 18 10/17/2019 0630   LDLCALC 56 05/07/2024 0919    CBC    Component Value Date/Time   WBC 6.9 05/07/2024 0919   RBC 3.76 (L) 05/07/2024 0919   HGB 10.5 (L) 05/07/2024 0919   HCT 33.3 (L) 05/07/2024 0919   PLT 226 05/07/2024 0919   MCV 88.6 05/07/2024 0919   MCH 27.9 05/07/2024 0919   MCHC 31.5 (L) 05/07/2024 0919   RDW 12.8 05/07/2024 0919   LYMPHSABS 2.6 11/18/2020 0939   MONOABS 0.5 11/18/2020 0939   EOSABS 0.4 11/18/2020 0939   BASOSABS 0.0 11/18/2020 0939    Hgb A1C Lab Results  Component Value Date   HGBA1C 6.3 (H) 05/07/2024            Assessment & Plan:   Preventative Health Maintenance:  Flu shot today Tetanus UTD Encouraged her to get her COVID booster Pneumovax UTD Prevnar 20 today Shingrix UTD Pap smear UTD Mammogram ordered-she will call to schedule Bone density UTD She declines referral to GI for screening colonoscopy, reports that they will send her a letter in the mail and then she will get this scheduled Encouraged her to consume a balanced diet and exercise regimen Advised her to see an eye doctor and dentist annually We will check CBC, c-Met, TSH, Free T4, lipid,  andd A1c today  Skin lesion of left elbow:  With the size, I would be concerned that this could possibly be skin cancer versus melanoma Urgent referral placed to dermatology  RTC in 6 months, follow-up chronic conditions Angeline Laura, NP

## 2024-11-08 ENCOUNTER — Ambulatory Visit: Payer: Self-pay | Admitting: Internal Medicine

## 2024-11-08 NOTE — Assessment & Plan Note (Signed)
 A1C ordered

## 2024-11-12 ENCOUNTER — Telehealth (HOSPITAL_COMMUNITY): Admitting: Psychiatry

## 2024-11-12 ENCOUNTER — Encounter (HOSPITAL_COMMUNITY): Payer: Self-pay | Admitting: Psychiatry

## 2024-11-12 VITALS — Wt 270.0 lb

## 2024-11-12 DIAGNOSIS — F200 Paranoid schizophrenia: Secondary | ICD-10-CM | POA: Diagnosis not present

## 2024-11-12 DIAGNOSIS — F411 Generalized anxiety disorder: Secondary | ICD-10-CM

## 2024-11-12 MED ORDER — SERTRALINE HCL 50 MG PO TABS: 50.0000 mg | ORAL_TABLET | Freq: Every day | ORAL | 1 refills | Status: AC

## 2024-11-12 MED ORDER — HALOPERIDOL 5 MG PO TABS: ORAL_TABLET | ORAL | 1 refills | Status: AC

## 2024-11-12 NOTE — Progress Notes (Signed)
 [facility] MD Virtual Progress Note   Patient Location: Home Provider Location: Home Office  I connect with patient by video and verified that I am speaking with correct person by using two identifiers. I discussed the limitations of evaluation and management by telemedicine and the availability of in person appointments. I also discussed with the patient that there may be a patient responsible charge related to this service. The patient expressed understanding and agreed to proceed.  Maria Franklin [id number] 63 y.o.  11/12/2024 2:34 PM  History of Present Illness:  Patient is evaluated via video session.  She reported things are going okay and she had a good Thanksgiving with her husband.  She is looking forward for Christmas.  She is done with the shopping.  She has 3 grandkids who are 45-year-old, 99-year-old and 63 year old.  She is sleeping good.  Denies any paranoia, hallucination or any mania.  Her appetite is okay and weight is stable.  She has no tremor or shakes or any EPS.  She is compliant with Zoloft  and Haldol  which is keeping her mood stable.  She does not get overwhelmed or have any anxiety or panic attack.  She denies any hopelessness or worthlessness and she denies any active or passive suicidal thoughts.  Her last hemoglobin A1c 6.5.  Her creatinine is 1.20.  She is drinking water  regularly.  She denies drinking alcohol or using any illegal substances.  She like to keep the current medication.  Past Psychiatric History: H/O paranoia and delusions. H/O overdose and inpatient at [facility]. H/O overdose and inpatient at [facility]. Readmitted in 4 months due to noncompliant with medication. Last admission at [facility] in Nov 2020. Abilify worked but could not afford. No h/o abuse.      Outpatient Encounter Medications as of 11/12/2024  Medication Sig   albuterol  (PROVENTIL ) (2.5 MG/3ML) 0.083% nebulizer solution Take 3 mLs (2.5 mg total) by  nebulization every 4 (four) hours as needed for wheezing or shortness of breath.   albuterol  (VENTOLIN  HFA) 108 (90 Base) MCG/ACT inhaler INHALE 1 TO 2 PUFFS INTO THE LUNGS EVERY 6 HOURS AS NEEDED WHEEZING/SHORTNESS OF BREATH   amLODipine  (NORVASC ) 10 MG tablet TAKE 1 TABLET BY MOUTH EVERY DAY   aspirin  EC 81 MG EC tablet Take 1 tablet (81 mg total) by mouth daily.   atorvastatin  (LIPITOR) 40 MG tablet TAKE 1 TABLET BY MOUTH EVERY DAY AT 6PM   b complex vitamins capsule Take 1 capsule by mouth daily.   carvedilol  (COREG ) 12.5 MG tablet TAKE 1 TABLET BY MOUTH TWICE A DAY WITH FOOD   cetirizine  (ZYRTEC ) 10 MG tablet Take 1 tablet (10 mg total) by mouth daily.   Ferrous Sulfate  (IRON) 325 (65 Fe) MG TABS Take 1 tablet by mouth every morning.   fluticasone  (FLONASE ) 50 MCG/ACT nasal spray SPRAY 2 SPRAYS INTO EACH NOSTRIL EVERY DAY   fluticasone -salmeterol (ADVAIR ) 250-50 MCG/ACT AEPB Inhale 1 puff into the lungs in the morning and at bedtime.   haloperidol  (HALDOL ) 5 MG tablet TAKE 0.5 TABLETS (2.5 MG TOTAL) BY MOUTH AT BEDTIME.   hydrALAZINE  (APRESOLINE ) 25 MG tablet TAKE 1/2 TABLET BY MOUTH EVERY MORNING AND AT BEDTIME   levothyroxine  (SYNTHROID ) 50 MCG tablet TAKE 1 TABLET BY MOUTH DAILY BEFORE BREAKFAST   lisinopril  (ZESTRIL ) 40 MG tablet TAKE 1 TABLET BY MOUTH EVERY DAY   Multiple Vitamins-Minerals (MULTIVITAMIN WITH MINERALS) tablet Take 1 tablet by mouth every evening.   omeprazole  (PRILOSEC) 20  MG capsule TAKE 1 CAPSULE BY MOUTH EVERY DAY   sertraline  (ZOLOFT ) 50 MG tablet Take 1 tablet (50 mg total) by mouth daily.   No facility-administered encounter medications on file as of 11/12/2024.    Recent Results (from the past 2160 hours)  CBC     Status: Abnormal   Collection Time: 11/07/24  9:55 AM  Result Value Ref Range   WBC 6.6 3.8 - 10.8 Thousand/uL   RBC 3.94 3.80 - 5.10 Million/uL   Hemoglobin 11.2 (L) 11.7 - 15.5 g/dL   HCT 65.0 (L) 64.0 - 53.9 %   MCV 88.6 81.4 - 101.7 fL    MCH 28.4 27.0 - 33.0 pg   MCHC 32.1 31.6 - 35.4 g/dL    Comment: For adults, a slight decrease in the calculated MCHC value (in the range of 30 to 32 g/dL) is most likely not clinically significant; however, it should be interpreted with caution in correlation with other red cell parameters and the patient's clinical condition.    RDW 12.5 11.0 - 15.0 %   Platelets 233 140 - 400 Thousand/uL   MPV 9.9 7.5 - 12.5 fL  Comprehensive metabolic panel with GFR     Status: Abnormal   Collection Time: 11/07/24  9:55 AM  Result Value Ref Range   Glucose, Bld 118 (H) 65 - 99 mg/dL    Comment: .            Fasting reference interval . For someone without known diabetes, a glucose value between 100 and 125 mg/dL is consistent with prediabetes and should be confirmed with a follow-up test. .    BUN 17 7 - 25 mg/dL   Creat 8.79 (H) 9.49 - 1.05 mg/dL   eGFR 51 (L) > OR = 60 mL/min/1.101m2   BUN/Creatinine Ratio 14 6 - 22 (calc)   Sodium 141 135 - 146 mmol/L   Potassium 4.4 3.5 - 5.3 mmol/L   Chloride 106 98 - 110 mmol/L   CO2 27 20 - 32 mmol/L   Calcium  9.3 8.6 - 10.4 mg/dL   Total Protein 6.8 6.1 - 8.1 g/dL   Albumin 4.2 3.6 - 5.1 g/dL   Globulin 2.6 1.9 - 3.7 g/dL (calc)   AG Ratio 1.6 1.0 - 2.5 (calc)   Total Bilirubin 0.4 0.2 - 1.2 mg/dL   Alkaline phosphatase (APISO) 90 37 - 153 U/L   AST 22 10 - 35 U/L   ALT 19 6 - 29 U/L  TSH     Status: None   Collection Time: 11/07/24  9:55 AM  Result Value Ref Range   TSH 3.80 0.40 - 4.50 mIU/L  T4, free     Status: None   Collection Time: 11/07/24  9:55 AM  Result Value Ref Range   Free T4 1.0 0.8 - 1.8 ng/dL  Lipid panel     Status: None   Collection Time: 11/07/24  9:55 AM  Result Value Ref Range   Cholesterol 160 <200 mg/dL   HDL 71 > OR = 50 mg/dL   Triglycerides 96 <849 mg/dL   LDL Cholesterol (Calc) 71 mg/dL (calc)    Comment: Reference range: <100 . Desirable range <100 mg/dL for primary prevention;   <70 mg/dL for  patients with CHD or diabetic patients  with > or = 2 CHD risk factors. SABRA LDL-C is now calculated using the Martin-Hopkins  calculation, which is a validated novel method providing  better accuracy than the Friedewald equation in the  estimation of LDL-C.  Gladis APPLETHWAITE et al. SANDREA. 7986;689(80): 2061-2068  (http://education.[facility].com/faq/FAQ164)    Total CHOL/HDL Ratio 2.3 <5.0 (calc)   Non-HDL Cholesterol (Calc) 89 <869 mg/dL (calc)    Comment: For patients with diabetes plus 1 major ASCVD risk  factor, treating to a non-HDL-C goal of <100 mg/dL  (LDL-C of <29 mg/dL) is considered a therapeutic  option.   Hemoglobin A1c     Status: Abnormal   Collection Time: 11/07/24  9:55 AM  Result Value Ref Range   Hgb A1c MFr Bld 6.5 (H) <5.7 %    Comment: For someone without known diabetes, a hemoglobin A1c value of 6.5% or greater indicates that they may have  diabetes and this should be confirmed with a follow-up  test. . For someone with known diabetes, a value <7% indicates  that their diabetes is well controlled and a value  greater than or equal to 7% indicates suboptimal  control. A1c targets should be individualized based on  duration of diabetes, age, comorbid conditions, and  other considerations. . Currently, no consensus exists regarding use of hemoglobin A1c for diagnosis of diabetes for children. .    Mean Plasma Glucose 140 mg/dL   eAG (mmol/L) 7.7 mmol/L     Psychiatric Specialty Exam: Physical Exam  Review of Systems  Weight 270 lb (122.5 kg), last menstrual period 02/02/2017.There is no height or weight on file to calculate BMI.  General Appearance: Casual  Eye Contact:  Good  Speech:  Normal Rate  Volume:  Normal  Mood:  Euthymic  Affect:  Appropriate  Thought Process:  Goal Directed  Orientation:  Full (Time, Place, and Person)  Thought Content:  WDL  Suicidal Thoughts:  No  Homicidal Thoughts:  No  Memory:  Immediate;   Good Recent;    Good Remote;   Good  Judgement:  Good  Insight:  Present  Psychomotor Activity:  Normal  Concentration:  Concentration: Good and Attention Span: Good  Recall:  Good  Fund of Knowledge:  Good  Language:  Good  Akathisia:  No  Handed:  Right  AIMS (if indicated):     Assets:  Communication Skills Desire for Improvement Housing Transportation  ADL's:  Intact  Cognition:  WNL  Sleep:  ok       11/07/2024    9:39 AM 05/07/2024    9:00 AM 04/27/2024    8:57 AM 11/07/2023    9:19 AM 04/22/2023    1:39 PM  Depression screen PHQ 2/9  Decreased Interest 0 0 0 0 0  Down, Depressed, Hopeless 0 0 0 0 0  PHQ - 2 Score 0 0 0 0 0  Altered sleeping 0 0 0  0  Tired, decreased energy 0 3 0  0  Change in appetite 0  0  0  Feeling bad or failure about yourself  0 0 0  0  Trouble concentrating 0 0 0  0  Moving slowly or fidgety/restless 0 0 0  0  Suicidal thoughts 0 0 0  0  PHQ-9 Score 0 3  0   0   Difficult doing work/chores Not difficult at all Somewhat difficult Not difficult at all  Not difficult at all     Data saved with a previous flowsheet row definition    Assessment/Plan: Paranoid schizophrenia, chronic condition (HCC) - Plan: haloperidol  (HALDOL ) 5 MG tablet, sertraline  (ZOLOFT ) 50 MG tablet  GAD (generalized anxiety disorder) - Plan: sertraline  (ZOLOFT ) 50 MG tablet  Patient is  63 year old African-American retired female with history of schizophrenia chronic paranoid type and generalized anxiety disorder.  Currently stable on Haldol  2.5 mg at bedtime and Zoloft  50 mg daily.  Reviewed blood work results.  Hemoglobin A1c 6.5 and creatinine 1.0.  Encourage hydration.  Discussed medication side effects and benefits.  Recommend to call back if she is any question or any concern.  Follow-up in 6 months.   Follow Up Instructions:     I discussed the assessment and treatment plan with the patient. The patient was provided an opportunity to ask questions and all were answered. The  patient agreed with the plan and demonstrated an understanding of the instructions.   The patient was advised to call back or seek an in-person evaluation if the symptoms worsen or if the condition fails to improve as anticipated.    Collaboration of Care: Other provider involved in patient's care AEB notes are available in epic to review  Patient/Guardian was advised Release of Information must be obtained prior to any record release in order to collaborate their care with an outside provider. Patient/Guardian was advised if they have not already done so to contact the registration department to sign all necessary forms in order for us  to release information regarding their care.   Consent: Patient/Guardian gives verbal consent for treatment and assignment of benefits for services provided during this visit. Patient/Guardian expressed understanding and agreed to proceed.     Total encounter time 11 minutes which includes face-to-face time, chart reviewed, care coordination, order entry and documentation during this encounter.   Note: This document was prepared by [employer] voice dictation technology and any errors that results from this process are unintentional.    Leni ONEIDA Client, MD 11/12/2024

## 2024-11-15 ENCOUNTER — Ambulatory Visit: Admitting: Dermatology

## 2024-11-15 ENCOUNTER — Encounter: Payer: Self-pay | Admitting: Dermatology

## 2024-11-15 DIAGNOSIS — L280 Lichen simplex chronicus: Secondary | ICD-10-CM

## 2024-11-15 DIAGNOSIS — D485 Neoplasm of uncertain behavior of skin: Secondary | ICD-10-CM

## 2024-11-15 NOTE — Patient Instructions (Signed)

## 2024-11-15 NOTE — Progress Notes (Signed)
° °  New Patient Visit   History of Present Illness Maria Franklin is a 63 year old female who presents with a violaceous plaque on her arm.  She has had a spot on her arm for a few months, which has been increasing in size. The lesion is itchy but not painful or bleeding.  She has not undergone a skin biopsy before. Previously, she was prescribed a topical cream to use for seven days, which did not alleviate the symptoms.   Pt has growth on her left arm for several months she would like evaluated.She has no hx of skin cancer  The following portions of the chart were reviewed this encounter and updated as appropriate: medications, allergies, medical history  Review of Systems:  No other skin or systemic complaints except as noted in HPI or Assessment and Plan.  Objective  Well appearing patient in no apparent distress; mood and affect are within normal limits.  A focused examination was performed of the following areas: arm  Relevant exam findings are noted in the Assessment and Plan.  Left Elbow - Posterior 1.5 cm violaceous plaque   Assessment & Plan  NEOPLASM OF UNCERTAIN BEHAVIOR OF SKIN Left Elbow - Posterior - Skin / nail biopsy Type of biopsy: tangential   Informed consent: discussed and consent obtained   Timeout: patient name, date of birth, surgical site, and procedure verified   Procedure prep:  Patient was prepped and draped in usual sterile fashion Prep type:  Isopropyl alcohol Anesthesia: the lesion was anesthetized in a standard fashion   Anesthetic:  1% lidocaine  w/ epinephrine  1-100,000 buffered w/ 8.4% NaHCO3 Instrument used: DermaBlade   Hemostasis achieved with: aluminum chloride   Outcome: patient tolerated procedure well   Post-procedure details: sterile dressing applied and wound care instructions given   Dressing type: petrolatum  and pressure dressing    Specimen 1 - Surgical pathology Differential Diagnosis: R/O NMSC vs DFSP vs eczema vs  psoriasis vs other   Check Margins: No  Return for based on biopsy results.  I, Darice Smock, CMA, am acting as scribe for RUFUS CHRISTELLA HOLY, MD.   Documentation: I have reviewed the above documentation for accuracy and completeness, and I agree with the above.  RUFUS CHRISTELLA HOLY, MD

## 2024-11-19 LAB — SURGICAL PATHOLOGY

## 2024-11-20 ENCOUNTER — Other Ambulatory Visit: Payer: Self-pay | Admitting: Dermatology

## 2024-11-20 ENCOUNTER — Ambulatory Visit: Payer: Self-pay | Admitting: Dermatology

## 2024-11-20 DIAGNOSIS — L280 Lichen simplex chronicus: Secondary | ICD-10-CM

## 2024-11-20 MED ORDER — CLOBETASOL PROPIONATE 0.05 % EX CREA: 1.0000 | TOPICAL_CREAM | Freq: Two times a day (BID) | CUTANEOUS | 0 refills | Status: DC

## 2024-11-21 ENCOUNTER — Other Ambulatory Visit: Payer: Self-pay

## 2024-11-21 DIAGNOSIS — L280 Lichen simplex chronicus: Secondary | ICD-10-CM

## 2024-11-21 MED ORDER — CLOBETASOL PROPIONATE 0.05 % EX CREA: 1.0000 | TOPICAL_CREAM | Freq: Two times a day (BID) | CUTANEOUS | 0 refills | Status: AC

## 2024-11-21 NOTE — Progress Notes (Signed)
 Patient cancelled the prescription because she was not aware of what it was for. Resending to patient pharmacy.

## 2024-11-23 ENCOUNTER — Other Ambulatory Visit: Payer: Self-pay | Admitting: Internal Medicine

## 2024-11-23 DIAGNOSIS — I10 Essential (primary) hypertension: Secondary | ICD-10-CM

## 2024-11-27 NOTE — Telephone Encounter (Signed)
 Requested Prescriptions  Pending Prescriptions Disp Refills   lisinopril  (ZESTRIL ) 40 MG tablet [Pharmacy Med Name: LISINOPRIL  40 MG TABLET] 90 tablet 1    Sig: TAKE 1 TABLET BY MOUTH EVERY DAY     Cardiovascular:  ACE Inhibitors Failed - 11/27/2024 10:18 AM      Failed - Cr in normal range and within 180 days    Creat  Date Value Ref Range Status  11/07/2024 1.20 (H) 0.50 - 1.05 mg/dL Final   Creatinine, Urine  Date Value Ref Range Status  05/07/2024 122 20 - 275 mg/dL Final         Passed - K in normal range and within 180 days    Potassium  Date Value Ref Range Status  11/07/2024 4.4 3.5 - 5.3 mmol/L Final         Passed - Patient is not pregnant      Passed - Last BP in normal range    BP Readings from Last 1 Encounters:  11/07/24 120/74         Passed - Valid encounter within last 6 months    Recent Outpatient Visits           2 weeks ago Encounter for general adult medical examination with abnormal findings   [place] [facility] [place], [place] W, NP   6 months ago Type 2 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 disease, without long-term current use of insulin  [facility])   [facility] [place], Angeline ORN, [place]

## 2024-12-11 ENCOUNTER — Encounter: Payer: Self-pay | Admitting: Internal Medicine

## 2024-12-13 ENCOUNTER — Other Ambulatory Visit: Payer: Self-pay | Admitting: Internal Medicine

## 2024-12-13 DIAGNOSIS — E039 Hypothyroidism, unspecified: Secondary | ICD-10-CM

## 2024-12-13 DIAGNOSIS — I10 Essential (primary) hypertension: Secondary | ICD-10-CM

## 2024-12-13 NOTE — Telephone Encounter (Signed)
 Requested Prescriptions  Pending Prescriptions Disp Refills   hydrALAZINE  (APRESOLINE ) 25 MG tablet [Pharmacy Med Name: HYDRALAZINE  25 MG TABLET] 90 tablet 3    Sig: TAKE 1/2 TABLET BY MOUTH EVERY MORNING AND AT BEDTIME     Cardiovascular:  Vasodilators Failed - 12/13/2024  3:12 PM      Failed - HCT in normal range and within 360 days    HCT  Date Value Ref Range Status  11/07/2024 34.9 (L) 35.9 - 46.0 % Final         Failed - HGB in normal range and within 360 days    Hemoglobin  Date Value Ref Range Status  11/07/2024 11.2 (L) 11.7 - 15.5 g/dL Final         Failed - ANA Screen, Ifa, Serum in normal range and within 360 days    Anti Nuclear Antibody (ANA)  Date Value Ref Range Status  07/22/2021 NEGATIVE NEGATIVE Final    Comment:    ANA IFA is a first line screen for detecting the presence of up to approximately 150 autoantibodies in various autoimmune diseases. A negative ANA IFA result suggests an ANA-associated autoimmune disease is not present at this time, but is not definitive. If there is high clinical suspicion for Sjogren's syndrome, testing for anti-SS-A/Ro antibody should be considered. Anti-Jo-1 antibody should be considered for clinically suspected inflammatory myopathies. . AC-0: Negative . International Consensus on ANA Patterns (severties.uy) . For additional information, please refer to http://education.[facility].com/faq/FAQ177 (This link is being provided for informational/ educational purposes only.) .          Passed - RBC in normal range and within 360 days    RBC  Date Value Ref Range Status  11/07/2024 3.94 3.80 - 5.10 Million/uL Final         Passed - WBC in normal range and within 360 days    WBC  Date Value Ref Range Status  11/07/2024 6.6 3.8 - 10.8 Thousand/uL Final         Passed - PLT in normal range and within 360 days    Platelets  Date Value Ref Range Status  11/07/2024 233 140 - 400  Thousand/uL Final         Passed - Last BP in normal range    BP Readings from Last 1 Encounters:  11/07/24 120/74         Passed - Valid encounter within last 12 months    Recent Outpatient Visits           1 month ago Encounter for general adult medical examination with abnormal findings   [place] [facility] [place], [place] W, NP   7 months ago Type 2 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 disease, without long-term current use of insulin  [facility])   [place] [facility] [place], [place] W, NP               levothyroxine  (SYNTHROID ) 50 MCG tablet [Pharmacy Med Name: LEVOTHYROXINE  50 MCG TABLET] 90 tablet 3    Sig: TAKE 1 TABLET BY MOUTH DAILY BEFORE BREAKFAST     Endocrinology:  Hypothyroid Agents Passed - 12/13/2024  3:12 PM      Passed - TSH in normal range and within 360 days    TSH  Date Value Ref Range Status  11/07/2024 3.80 0.40 - 4.50 mIU/L Final         Passed - Valid encounter within last 12 months    Recent Outpatient  Visits           1 month ago Encounter for general adult medical examination with abnormal findings   [place] [facility] [place], [place], NP   7 months ago Type 2 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 disease, without long-term current use of insulin  [facility])   [place] [facility] [place], [place], NP               carvedilol  (COREG ) 12.5 MG tablet [Pharmacy Med Name: CARVEDILOL  12.5 MG TABLET] 180 tablet 1    Sig: TAKE 1 TABLET BY MOUTH TWICE A DAY WITH FOOD     Cardiovascular: Beta Blockers 3 Failed - 12/13/2024  3:12 PM      Failed - Cr in normal range and within 360 days    Creat  Date Value Ref Range Status  11/07/2024 1.20 (H) 0.50 - 1.05 mg/dL Final   Creatinine, Urine  Date Value Ref Range Status  05/07/2024 122 20 - 275 mg/dL Final         Passed - AST in normal range and within 360 days    AST  Date Value Ref Range Status  11/07/2024  22 10 - 35 U/L Final         Passed - ALT in normal range and within 360 days    ALT  Date Value Ref Range Status  11/07/2024 19 6 - 29 U/L Final         Passed - Last BP in normal range    BP Readings from Last 1 Encounters:  11/07/24 120/74         Passed - Last Heart Rate in normal range    Pulse Readings from Last 1 Encounters:  09/04/24 78         Passed - Valid encounter within last 6 months    Recent Outpatient Visits           1 month ago Encounter for general adult medical examination with abnormal findings   [place] [facility] [place], [place], NP   7 months ago Type 2 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 disease, without long-term current use of insulin  [facility])   [facility] [place], [place], NP               amLODipine  (NORVASC ) 10 MG tablet [Pharmacy Med Name: AMLODIPINE  BESYLATE 10 MG TAB] 90 tablet 1    Sig: TAKE 1 TABLET BY MOUTH EVERY DAY     Cardiovascular: Calcium  Channel Blockers 2 Passed - 12/13/2024  3:12 PM      Passed - Last BP in normal range    BP Readings from Last 1 Encounters:  11/07/24 120/74         Passed - Last Heart Rate in normal range    Pulse Readings from Last 1 Encounters:  09/04/24 78         Passed - Valid encounter within last 6 months    Recent Outpatient Visits           1 month ago Encounter for general adult medical examination with abnormal findings   [place] [facility] [place], [place], NP   7 months ago Type 2 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 disease, without long-term current use of insulin  [facility])   [place] [facility] [place], Angeline [place]N, [place]

## 2024-12-14 ENCOUNTER — Other Ambulatory Visit: Payer: Self-pay | Admitting: Internal Medicine

## 2024-12-14 NOTE — Telephone Encounter (Signed)
 D/C 05/07/24 Requested Prescriptions  Refused Prescriptions Disp Refills   omeprazole  (PRILOSEC) 40 MG capsule [Pharmacy Med Name: OMEPRAZOLE  DR 40 MG CAPSULE] 90 capsule 3    Sig: TAKE 1 CAPSULE BY MOUTH EVERY DAY     Gastroenterology: Proton Pump Inhibitors Passed - 12/14/2024  2:18 PM      Passed - Valid encounter within last 12 months    Recent Outpatient Visits           1 month ago Encounter for general adult medical examination with abnormal findings   [place] [facility] [place], [place], NP   7 months ago Type 2 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 disease, without long-term current use of insulin  [facility])   [place] [facility] [place], Ang[place], [place]

## 2024-12-18 ENCOUNTER — Telehealth: Payer: Self-pay | Admitting: *Deleted

## 2024-12-18 NOTE — Telephone Encounter (Signed)
 Copy of this note placed on PV faces sheet for RN doing PV.

## 2024-12-18 NOTE — Telephone Encounter (Signed)
 Team,  Please measure this pt's ht (without shoes) and weight during her PV appt  Thanks,  Norleen EMERSON Schillings

## 2024-12-25 ENCOUNTER — Ambulatory Visit

## 2024-12-25 VITALS — Ht 62.5 in | Wt 260.0 lb

## 2024-12-25 DIAGNOSIS — Z860100 Personal history of colon polyps, unspecified: Secondary | ICD-10-CM

## 2024-12-25 MED ORDER — NA SULFATE-K SULFATE-MG SULF 17.5-3.13-1.6 GM/177ML PO SOLN: 1.0000 | Freq: Once | ORAL | 0 refills | Status: AC

## 2024-12-25 NOTE — Progress Notes (Signed)
 RN confirmed patient name, date of birth, and address RN confirmed date and time of procedure RN reviewed and confirmed allergies  RN reviewed and updated current medications; confirmed preferred pharmacy Pt is not on diet pills nor GLP-1 medications Pt is not on blood thinners RN reviewed medical & surgical hx  Pt denies issues with chronic constipation  Diabetic - yes No A fib or A flutter No cardiac tests are pending  Pt is not on home 02  No issues known with past sedation with any surgeries or procedures Patient denies ever being told they had issues or difficulty with intubation  Patient unaware of any fh of malignant hyperthermia Ambulates independently RN reviewed prep instructions and explained time frames for holding certain medications RN answered patient questions; patient stated understanding Prep instructions sent via My Chart per patient request

## 2024-12-31 ENCOUNTER — Encounter: Payer: Self-pay | Admitting: Internal Medicine

## 2025-01-11 ENCOUNTER — Ambulatory Visit: Admitting: Internal Medicine

## 2025-01-11 ENCOUNTER — Encounter: Payer: Self-pay | Admitting: Internal Medicine

## 2025-01-11 VITALS — BP 103/61 | HR 78 | Temp 97.7°F | Resp 17 | Ht 62.5 in | Wt 260.0 lb

## 2025-01-11 DIAGNOSIS — Z860100 Personal history of colon polyps, unspecified: Secondary | ICD-10-CM

## 2025-01-11 DIAGNOSIS — D122 Benign neoplasm of ascending colon: Secondary | ICD-10-CM

## 2025-01-11 DIAGNOSIS — D123 Benign neoplasm of transverse colon: Secondary | ICD-10-CM

## 2025-01-11 DIAGNOSIS — D120 Benign neoplasm of cecum: Secondary | ICD-10-CM

## 2025-01-11 MED ORDER — SODIUM CHLORIDE 0.9 % IV SOLN: 500.0000 mL | Freq: Once | INTRAVENOUS | Status: DC

## 2025-01-11 NOTE — Op Note (Signed)
 [facility] Patient Name: Maria Franklin Procedure Date: 01/11/2025 9:32 AM MRN: [id number] Endoscopist: Rosario Estefana Kidney , , [id number] Age: 64 Referring MD:  Date of Birth: August 28, 1961 Gender: Female Account #: [id number] Procedure:                Colonoscopy Indications:              High risk colon cancer surveillance: Personal                            history of colonic polyps Medicines:                Monitored Anesthesia Care Procedure:                Pre-Anesthesia Assessment:                           - Prior to the procedure, a History and Physical                            was performed, and patient medications and                            allergies were reviewed. The patient's tolerance of                            previous anesthesia was also reviewed. The risks                            and benefits of the procedure and the sedation                            options and risks were discussed with the patient.                            All questions were answered, and informed consent                            was obtained. Prior Anticoagulants: The patient has                            taken no anticoagulant or antiplatelet agents. ASA                            Grade Assessment: II - A patient with mild systemic                            disease. After reviewing the risks and benefits,                            the patient was deemed in satisfactory condition to                            undergo the procedure.  After obtaining informed consent, the colonoscope                            was passed under direct vision. Throughout the                            procedure, the patient's blood pressure, pulse, and                            oxygen saturations were monitored continuously. The                            CF [id number] #[id number] was introduced through the anus                            and advanced to the the  terminal ileum. The                            colonoscopy was performed without difficulty. The                            patient tolerated the procedure well. The quality                            of the bowel preparation was excellent. The                            terminal ileum, ileocecal valve, appendiceal                            orifice, and rectum were photographed. Scope In: 9:47:26 AM Scope Out: 9:59:40 AM Scope Withdrawal Time: 0 hours 9 minutes 43 seconds  Total Procedure Duration: 0 hours 12 minutes 14 seconds  Findings:                 The terminal ileum appeared normal.                           Three sessile polyps were found in the transverse                            colon, ascending colon and cecum. The polyps were 3                            to 5 mm in size. These polyps were removed with a                            cold snare. Resection and retrieval were complete.                           Multiple diverticula were found in the sigmoid                            colon and descending colon.  Non-bleeding internal hemorrhoids were found during                            retroflexion. Complications:            No immediate complications. Estimated Blood Loss:     Estimated blood loss was minimal. Impression:               - The examined portion of the ileum was normal.                           - Three 3 to 5 mm polyps in the transverse colon,                            in the ascending colon and in the cecum, removed                            with a cold snare. Resected and retrieved.                           - Diverticulosis in the sigmoid colon and in the                            descending colon.                           - Non-bleeding internal hemorrhoids. Recommendation:           - Discharge patient to home (with escort).                           - Await pathology results.                           - The findings and  recommendations were discussed                            with the patient. Dr Estefana Federico Rosario Estefana Federico,  01/11/2025 10:02:59 AM

## 2025-01-11 NOTE — Progress Notes (Signed)
 "   GASTROENTEROLOGY PROCEDURE H&P NOTE   Primary Care Physician: Antonette Angeline ORN, NP    Reason for Procedure:   History of colon polyps  Plan:    Colonoscopy   Patient is appropriate for endoscopic procedure(s) in the ambulatory ([facility]) setting.  The nature of the procedure, as well as the risks, benefits, and alternatives were carefully and thoroughly reviewed with the patient. Ample time for discussion and questions allowed. The patient understood, was satisfied, and agreed to proceed.     HPI: Maria Franklin is a 64 y.o. female who presents for colonoscopy for history of colon polyps. Denies blood in stools, changes in bowel habits, or unintentional weight loss. Denies family history of colon cancer.  Colonoscopy 12/21/21:  - The terminal ileum appeared normal. - Three sessile polyps were found in the transverse colon and ascending colon. The polyps were 2 to 5 mm in size. These polyps were removed with a cold snare. Resection and retrieval were complete. - Multiple diverticula were found in the sigmoid colon. - Non- bleeding internal hemorrhoids were found during retroflexion. Path: urgical [P], colon, ascending, transverse, polyp (3) - TUBULAR ADENOMA(S) - NEGATIVE FOR HIGH-GRADE DYSPLASIA OR MALIGNANCY  Past Medical History:  Diagnosis Date   Allergy    Anemia    Angio-edema    Anxiety    Asthma    ALL THE TIME   LAST FLARE UP 12/2018   Chest pain 08/2019   Chicken pox    Chronic kidney disease    Depression    Diabetes mellitus without complication (HCC)    GERD (gastroesophageal reflux disease)    Hyperlipidemia    Hypertension    Hyperthyroidism    Recurrent upper respiratory infection (URI)    Schizophrenia (HCC)    Sleep apnea    DX 2-3 YRS AGO.   Sleep apnea    Thyroid  disease    Urticaria     Past Surgical History:  Procedure Laterality Date   COLONOSCOPY     left adrenal gland removal  07/2019   SINOSCOPY     SINUS ENDO W/FUSION Bilateral  01/05/2019   Procedure: ENDOSCOPIC SINUS SURGERY WITH NAVIGATION;  Surgeon: Mable Lenis, MD;  Location: [facility] OR;  Service: ENT;  Laterality: Bilateral;   SINUS IRRIGATION     TONSILLECTOMY     TUBAL LIGATION     TURBINATE REDUCTION N/A 01/05/2019   Procedure: Turbinate Reduction;  Surgeon: Mable Lenis, MD;  Location: [facility] OR;  Service: ENT;  Laterality: N/A;    Prior to Admission medications  Medication Sig Start Date End Date Taking? Authorizing Provider  albuterol  (PROVENTIL ) (2.5 MG/3ML) 0.083% nebulizer solution Take 3 mLs (2.5 mg total) by nebulization every 4 (four) hours as needed for wheezing or shortness of breath. 01/28/22   Iva Marty Saltness, MD  albuterol  (VENTOLIN  HFA) 108 [phone] Base) MCG/ACT inhaler INHALE 1 TO 2 PUFFS INTO THE LUNGS EVERY 6 HOURS AS NEEDED WHEEZING/SHORTNESS OF BREATH 09/28/22   Iva Marty Saltness, MD  amLODipine  (NORVASC ) 10 MG tablet TAKE 1 TABLET BY MOUTH EVERY DAY 12/13/24   Antonette Angeline ORN, NP  aspirin  EC 81 MG EC tablet Take 1 tablet (81 mg total) by mouth daily. 09/01/19   Cherlyn Labella, MD  atorvastatin  (LIPITOR) 40 MG tablet TAKE 1 TABLET BY MOUTH EVERY DAY AT 6PM 07/09/24   Antonette Angeline ORN, NP  b complex vitamins capsule Take 1 capsule by mouth daily.    [provider]  carvedilol  (COREG ) 12.5  MG tablet TAKE 1 TABLET BY MOUTH TWICE A DAY WITH FOOD 12/13/24   Antonette Angeline ORN, NP  cetirizine  (ZYRTEC ) 10 MG tablet Take 1 tablet (10 mg total) by mouth daily. 01/28/22   Iva Marty Saltness, MD  Ferrous Sulfate  (IRON) 325 (65 Fe) MG TABS Take 1 tablet by mouth every morning. Patient taking differently: Take 1 tablet by mouth 2 (two) times daily. 11/05/19   [provider]  fluticasone  (FLONASE ) 50 MCG/ACT nasal spray SPRAY 2 SPRAYS INTO EACH NOSTRIL EVERY DAY 11/06/24   Antonette Angeline ORN, NP  fluticasone -salmeterol (ADVAIR ) 250-50 MCG/ACT AEPB Inhale 1 puff into the lungs in the morning and at bedtime. Patient not taking: Reported on  12/25/2024 10/23/24   Antonette Angeline ORN, NP  glucosamine-chondroitin 500-400 MG tablet Take 1 tablet by mouth 3 (three) times daily.    [provider]  haloperidol  (HALDOL ) 5 MG tablet TAKE 0.5 TABLETS (2.5 MG TOTAL) BY MOUTH AT BEDTIME. 11/12/24   Arfeen, Leni DASEN, MD  hydrALAZINE  (APRESOLINE ) 25 MG tablet TAKE 1/2 TABLET BY MOUTH EVERY MORNING AND AT BEDTIME 12/13/24   Antonette Angeline ORN, NP  levothyroxine  (SYNTHROID ) 50 MCG tablet TAKE 1 TABLET BY MOUTH DAILY BEFORE BREAKFAST 12/13/24   Antonette Angeline ORN, NP  lisinopril  (ZESTRIL ) 40 MG tablet TAKE 1 TABLET BY MOUTH EVERY DAY 11/27/24   Antonette Angeline ORN, NP  Multiple Vitamins-Minerals (MULTIVITAMIN WITH MINERALS) tablet Take 1 tablet by mouth every evening.    [provider]  omega-3 acid ethyl esters (LOVAZA) 1 g capsule Take by mouth 2 (two) times daily.    [provider]  omeprazole  (PRILOSEC) 20 MG capsule TAKE 1 CAPSULE BY MOUTH EVERY DAY 10/26/24   Antonette Angeline ORN, NP  sertraline  (ZOLOFT ) 50 MG tablet Take 1 tablet (50 mg total) by mouth daily. 11/12/24 05/11/25  Curry Leni DASEN, MD    Current Outpatient Medications  Medication Sig Dispense Refill   albuterol  (PROVENTIL ) (2.5 MG/3ML) 0.083% nebulizer solution Take 3 mLs (2.5 mg total) by nebulization every 4 (four) hours as needed for wheezing or shortness of breath. 75 mL 1   albuterol  (VENTOLIN  HFA) 108 (90 Base) MCG/ACT inhaler INHALE 1 TO 2 PUFFS INTO THE LUNGS EVERY 6 HOURS AS NEEDED WHEEZING/SHORTNESS OF BREATH 18 each 1   amLODipine  (NORVASC ) 10 MG tablet TAKE 1 TABLET BY MOUTH EVERY DAY 90 tablet 1   aspirin  EC 81 MG EC tablet Take 1 tablet (81 mg total) by mouth daily. 30 tablet 0   atorvastatin  (LIPITOR) 40 MG tablet TAKE 1 TABLET BY MOUTH EVERY DAY AT 6PM 90 tablet 3   b complex vitamins capsule Take 1 capsule by mouth daily.     carvedilol  (COREG ) 12.5 MG tablet TAKE 1 TABLET BY MOUTH TWICE A DAY WITH FOOD 180 tablet 1   cetirizine  (ZYRTEC ) 10 MG tablet Take 1  tablet (10 mg total) by mouth daily. 30 tablet 5   Ferrous Sulfate  (IRON) 325 (65 Fe) MG TABS Take 1 tablet by mouth every morning. (Patient taking differently: Take 1 tablet by mouth 2 (two) times daily.)     fluticasone  (FLONASE ) 50 MCG/ACT nasal spray SPRAY 2 SPRAYS INTO EACH NOSTRIL EVERY DAY 48 mL 2   fluticasone -salmeterol (ADVAIR ) 250-50 MCG/ACT AEPB Inhale 1 puff into the lungs in the morning and at bedtime. (Patient not taking: Reported on 12/25/2024) 60 each 2   glucosamine-chondroitin 500-400 MG tablet Take 1 tablet by mouth 3 (three) times daily.     haloperidol  (  HALDOL ) 5 MG tablet TAKE 0.5 TABLETS (2.5 MG TOTAL) BY MOUTH AT BEDTIME. 45 tablet 1   hydrALAZINE  (APRESOLINE ) 25 MG tablet TAKE 1/2 TABLET BY MOUTH EVERY MORNING AND AT BEDTIME 90 tablet 3   levothyroxine  (SYNTHROID ) 50 MCG tablet TAKE 1 TABLET BY MOUTH DAILY BEFORE BREAKFAST 90 tablet 3   lisinopril  (ZESTRIL ) 40 MG tablet TAKE 1 TABLET BY MOUTH EVERY DAY 90 tablet 1   Multiple Vitamins-Minerals (MULTIVITAMIN WITH MINERALS) tablet Take 1 tablet by mouth every evening.     omega-3 acid ethyl esters (LOVAZA) 1 g capsule Take by mouth 2 (two) times daily.     omeprazole  (PRILOSEC) 20 MG capsule TAKE 1 CAPSULE BY MOUTH EVERY DAY 90 capsule 1   sertraline  (ZOLOFT ) 50 MG tablet Take 1 tablet (50 mg total) by mouth daily. 90 tablet 1   Current Facility-Administered Medications  Medication Dose Route Frequency Provider Last Rate Last Admin   0.9 %  sodium chloride  infusion  500 mL Intravenous Once Federico Rosario BROCKS, MD        Allergies as of 01/11/2025 - Review Complete 01/11/2025  Allergen Reaction Noted   Bee pollen Cough 03/17/2015   Pollen extract Cough 03/17/2015   Justicia adhatoda Dermatitis and Itching 09/24/2024   Latex Itching, Rash, and Other (See Comments) 01/18/2012    Family History  Problem Relation Age of Onset   Mental illness Mother    Hypertension Mother    Schizophrenia Mother    Depression Mother     Heart disease Mother    Diabetes Mother    COPD Mother    Obesity Mother    Cancer Father        LUNG   Hypertension Sister    Cancer Sister        BREAST   Breast cancer Sister    Stroke Sister    Hypertension Sister    Heart attack Sister    Hearing loss Sister    Obesity Sister    Hypertension Sister    Heart attack Sister    Heart disease Sister    Mental illness Brother    Hypertension Brother    Schizophrenia Brother    Depression Brother    Hypertension Brother    Schizophrenia Brother    Hypertension Brother    Asthma Brother    Breast cancer Maternal Aunt    Cancer Maternal Aunt    Schizophrenia Maternal Uncle    Heart disease Maternal Grandmother    Depression Maternal Grandmother    Schizophrenia Maternal Grandmother    Arthritis Maternal Grandmother    Obesity Maternal Grandmother    Allergic rhinitis Neg Hx    Angioedema Neg Hx    Atopy Neg Hx    Eczema Neg Hx    Immunodeficiency Neg Hx    Urticaria Neg Hx    Colon cancer Neg Hx    Colon polyps Neg Hx    Stomach cancer Neg Hx    Rectal cancer Neg Hx    Esophageal cancer Neg Hx     Social History   Socioeconomic History   Marital status: Married    Spouse name: Evalene Barter   Number of children: 4   Years of education: Associate Degree   Highest education level: Associate degree: academic program  Occupational History   Occupation: unemployed  Tobacco Use   Smoking status: Former    Current packs/day: 0.00    Average packs/day: 0.3 packs/day for 6.0 years (1.5 ttl pk-yrs)  Types: Cigarettes    Start date: 32    Quit date: 83    Years since quitting: 39.1   Smokeless tobacco: Never   Tobacco comments:    QUIT IN HER EARLY 20'S  Vaping Use   Vaping status: Never Used  Substance and Sexual Activity   Alcohol use: Not Currently   Drug use: No   Sexual activity: Not Currently    Birth control/protection: Post-menopausal, Surgical  Other Topics Concern   Not on file  Social  History Narrative   Pt is R handed   Lives in single story home with her husband and father-in-law   Has 4 children   Associates Degree x2   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   Social Drivers of Health   Tobacco Use: Medium Risk (01/11/2025)   Patient History    Smoking Tobacco Use: Former    Smokeless Tobacco Use: Never    Passive Exposure: Not on Actuary Strain: Low Risk (11/04/2024)   Overall Financial Resource Strain (CARDIA)    Difficulty of Paying Living Expenses: Not hard at all  Food Insecurity: No Food Insecurity (11/04/2024)   Epic    Worried About Programme Researcher, Broadcasting/film/video in the Last Year: Never true    Ran Out of Food in the Last Year: Never true  Transportation Needs: No Transportation Needs (11/04/2024)   Epic    Lack of Transportation (Medical): No    Lack of Transportation (Non-Medical): No  Physical Activity: Inactive (11/04/2024)   Exercise Vital Sign    Days of Exercise per Week: 0 days    Minutes of Exercise per Session: Not on file  Stress: No Stress Concern Present (11/04/2024)   [employer] of Occupational Health - Occupational Stress Questionnaire    Feeling of Stress: Not at all  Social Connections: Socially Integrated (11/04/2024)   Social Connection and Isolation Panel    Frequency of Communication with Friends and Family: More than three times a week    Frequency of Social Gatherings with Friends and Family: Patient declined    Attends Religious Services: More than 4 times per year    Active Member of [employer] or Organizations: Yes    Attends Engineer, Structural: More than 4 times per year    Marital Status: Married  Catering Manager Violence: Not At Risk (04/27/2024)   Humiliation, Afraid, Rape, and Kick questionnaire    Fear of Current or Ex-Partner: No    Emotionally Abused: No    Physically Abused: No    Sexually Abused: No  Depression (PHQ2-9): Low Risk (11/07/2024)    Depression (PHQ2-9)    PHQ-2 Score: 0  Alcohol Screen: Low Risk (11/04/2024)   Alcohol Screen    Last Alcohol Screening Score (AUDIT): 1  Housing: Low Risk (11/04/2024)   Epic    Unable to Pay for Housing in the Last Year: No    Number of Times Moved in the Last Year: 0    Homeless in the Last Year: No  Utilities: Not At Risk (04/27/2024)   AHC Utilities    Threatened with loss of utilities: No  Health Literacy: Adequate Health Literacy (04/27/2024)   B1300 Health Literacy    Frequency of need for help with medical instructions: Never    Physical Exam: Vital signs in last 24 hours: BP 133/84   Pulse 64   Temp 97.7 F (36.5 C)   Ht 5' 2.5 (1.588 m)  Wt 260 lb (117.9 kg)   LMP 02/02/2017   SpO2 97%   BMI 46.80 kg/m  GEN: NAD EYE: Sclerae anicteric ENT: MMM CV: Non-tachycardic Pulm: No increased work of breathing GI: Soft, NT/ND NEURO:  Alert & Oriented   Estefana Kidney, MD [facility]  01/11/2025 9:07 AM  "

## 2025-01-11 NOTE — Patient Instructions (Signed)
Resume previous diet. Await pathology results.   YOU HAD AN ENDOSCOPIC PROCEDURE TODAY AT THE [facility]:   Refer to the procedure report that was given to you for any specific questions about what was found during the examination.  If the procedure report does not answer your questions, please call your gastroenterologist to clarify.  If you requested that your care partner not be given the details of your procedure findings, then the procedure report has been included in a sealed envelope for you to review at your convenience later.  YOU SHOULD EXPECT: Some feelings of bloating in the abdomen. Passage of more gas than usual.  Walking can help get rid of the air that was put into your GI tract during the procedure and reduce the bloating. If you had a lower endoscopy (such as a colonoscopy or flexible sigmoidoscopy) you may notice spotting of blood in your stool or on the toilet paper. If you underwent a bowel prep for your procedure, you may not have a normal bowel movement for a few days.  Please Note:  You might notice some irritation and congestion in your nose or some drainage.  This is from the oxygen used during your procedure.  There is no need for concern and it should clear up in a day or so.  SYMPTOMS TO REPORT IMMEDIATELY:  Following lower endoscopy (colonoscopy or flexible sigmoidoscopy):  Excessive amounts of blood in the stool  Significant tenderness or worsening of abdominal pains  Swelling of the abdomen that is new, acute  Fever of 100F or higher  For urgent or emergent issues, a gastroenterologist can be reached at any hour by calling [phone]. Do not use MyChart messaging for urgent concerns.    DIET:  We do recommend a small meal at first, but then you may proceed to your regular diet.  Drink plenty of fluids but you should avoid alcoholic beverages for 24 hours.  ACTIVITY:  You should plan to take it easy for the rest of today and you should NOT  DRIVE or use heavy machinery until tomorrow (because of the sedation medicines used during the test).    FOLLOW UP: Our staff will call the number listed on your records the next business day following your procedure.  We will call around 7:15- 8:00 am to check on you and address any questions or concerns that you may have regarding the information given to you following your procedure. If we do not reach you, we will leave a message.     If any biopsies were taken you will be contacted by phone or by letter within the next 1-3 weeks.  Please call us at [phone] if you have not heard about the biopsies in 3 weeks.    SIGNATURES/CONFIDENTIALITY: You and/or your care partner have signed paperwork which will be entered into your electronic medical record.  These signatures attest to the fact that that the information above on your After Visit Summary has been reviewed and is understood.  Full responsibility of the confidentiality of this discharge information lies with you and/or your care-partner.

## 2025-01-11 NOTE — Progress Notes (Signed)
 Vss nad trans to pacu

## 2025-01-11 NOTE — Progress Notes (Signed)
 Pt's states no medical or surgical changes since previsit or office visit.

## 2025-01-11 NOTE — Progress Notes (Signed)
 Called to room to assist during endoscopic procedure.  Patient ID and intended procedure confirmed with present staff. Received instructions for my participation in the procedure from the performing physician.

## 2025-01-22 ENCOUNTER — Encounter: Admitting: Internal Medicine

## 2025-01-30 ENCOUNTER — Ambulatory Visit: Admitting: Dermatology

## 2025-05-03 ENCOUNTER — Ambulatory Visit

## 2025-05-08 ENCOUNTER — Ambulatory Visit

## 2025-05-13 ENCOUNTER — Telehealth (HOSPITAL_COMMUNITY): Admitting: Psychiatry
